# Patient Record
Sex: Female | Born: 1935
Health system: Southern US, Community
[De-identification: ages and names within clinical notes are randomized; demographics above are authoritative.]

## PROBLEM LIST (undated history)

## (undated) DIAGNOSIS — K648 Other hemorrhoids: Secondary | ICD-10-CM

## (undated) DIAGNOSIS — J449 Chronic obstructive pulmonary disease, unspecified: Secondary | ICD-10-CM

## (undated) DIAGNOSIS — E785 Hyperlipidemia, unspecified: Secondary | ICD-10-CM

## (undated) DIAGNOSIS — K2950 Unspecified chronic gastritis without bleeding: Secondary | ICD-10-CM

## (undated) DIAGNOSIS — F419 Anxiety disorder, unspecified: Secondary | ICD-10-CM

## (undated) DIAGNOSIS — R42 Dizziness and giddiness: Secondary | ICD-10-CM

## (undated) DIAGNOSIS — I779 Disorder of arteries and arterioles, unspecified: Secondary | ICD-10-CM

## (undated) DIAGNOSIS — H9190 Unspecified hearing loss, unspecified ear: Secondary | ICD-10-CM

## (undated) DIAGNOSIS — I219 Acute myocardial infarction, unspecified: Secondary | ICD-10-CM

## (undated) DIAGNOSIS — R0602 Shortness of breath: Secondary | ICD-10-CM

## (undated) DIAGNOSIS — I2510 Atherosclerotic heart disease of native coronary artery without angina pectoris: Secondary | ICD-10-CM

## (undated) DIAGNOSIS — K219 Gastro-esophageal reflux disease without esophagitis: Secondary | ICD-10-CM

## (undated) DIAGNOSIS — Z9889 Other specified postprocedural states: Secondary | ICD-10-CM

## (undated) DIAGNOSIS — D692 Other nonthrombocytopenic purpura: Secondary | ICD-10-CM

## (undated) DIAGNOSIS — K449 Diaphragmatic hernia without obstruction or gangrene: Secondary | ICD-10-CM

## (undated) DIAGNOSIS — K589 Irritable bowel syndrome without diarrhea: Secondary | ICD-10-CM

## (undated) DIAGNOSIS — C801 Malignant (primary) neoplasm, unspecified: Secondary | ICD-10-CM

## (undated) DIAGNOSIS — F329 Major depressive disorder, single episode, unspecified: Secondary | ICD-10-CM

## (undated) DIAGNOSIS — I739 Peripheral vascular disease, unspecified: Secondary | ICD-10-CM

## (undated) DIAGNOSIS — I639 Cerebral infarction, unspecified: Secondary | ICD-10-CM

## (undated) DIAGNOSIS — F32A Depression, unspecified: Secondary | ICD-10-CM

## (undated) DIAGNOSIS — I509 Heart failure, unspecified: Secondary | ICD-10-CM

## (undated) DIAGNOSIS — R112 Nausea with vomiting, unspecified: Secondary | ICD-10-CM

## (undated) DIAGNOSIS — D130 Benign neoplasm of esophagus: Secondary | ICD-10-CM

## (undated) DIAGNOSIS — K5730 Diverticulosis of large intestine without perforation or abscess without bleeding: Secondary | ICD-10-CM

## (undated) DIAGNOSIS — M1990 Unspecified osteoarthritis, unspecified site: Secondary | ICD-10-CM

## (undated) HISTORY — DX: Disorder of arteries and arterioles, unspecified: I77.9

## (undated) HISTORY — PX: TONSILLECTOMY: SUR1361

## (undated) HISTORY — DX: Unspecified chronic gastritis without bleeding: K29.50

## (undated) HISTORY — DX: Unspecified hearing loss, unspecified ear: H91.90

## (undated) HISTORY — DX: Peripheral vascular disease, unspecified: I73.9

## (undated) HISTORY — DX: Gastro-esophageal reflux disease without esophagitis: K21.9

## (undated) HISTORY — PX: BREAST SURGERY: SHX581

## (undated) HISTORY — PX: TUBAL LIGATION: SHX77

## (undated) HISTORY — DX: Irritable bowel syndrome, unspecified: K58.9

## (undated) HISTORY — DX: Benign neoplasm of esophagus: D13.0

## (undated) HISTORY — DX: Other nonthrombocytopenic purpura: D69.2

## (undated) HISTORY — PX: SHOULDER SURGERY: SHX246

## (undated) HISTORY — DX: Depression, unspecified: F32.A

## (undated) HISTORY — PX: DENTAL SURGERY: SHX609

## (undated) HISTORY — DX: Chronic obstructive pulmonary disease, unspecified: J44.9

## (undated) HISTORY — DX: Anxiety disorder, unspecified: F41.9

## (undated) HISTORY — DX: Atherosclerotic heart disease of native coronary artery without angina pectoris: I25.10

## (undated) HISTORY — DX: Dizziness and giddiness: R42

## (undated) HISTORY — DX: Diaphragmatic hernia without obstruction or gangrene: K44.9

## (undated) HISTORY — PX: UPPER GASTROINTESTINAL ENDOSCOPY: SHX188

## (undated) HISTORY — DX: Diverticulosis of large intestine without perforation or abscess without bleeding: K57.30

## (undated) HISTORY — PX: FOOT SURGERY: SHX648

## (undated) HISTORY — DX: Other hemorrhoids: K64.8

## (undated) HISTORY — DX: Acute myocardial infarction, unspecified: I21.9

## (undated) HISTORY — PX: ANGIOPLASTY: SHX39

## (undated) HISTORY — DX: Hyperlipidemia, unspecified: E78.5

## (undated) HISTORY — PX: CATARACT EXTRACTION W/ INTRAOCULAR LENS  IMPLANT, BILATERAL: SHX1307

## (undated) HISTORY — PX: COLON SURGERY: SHX602

## (undated) HISTORY — DX: Heart failure, unspecified: I50.9

## (undated) HISTORY — PX: CAROTID ENDARTERECTOMY: SUR193

## (undated) HISTORY — PX: COLONOSCOPY: SHX174

## (undated) HISTORY — DX: Major depressive disorder, single episode, unspecified: F32.9

## (undated) HISTORY — DX: Unspecified osteoarthritis, unspecified site: M19.90

## (undated) HISTORY — PX: TOE SURGERY: SHX1073

---

## 1983-10-30 DIAGNOSIS — I219 Acute myocardial infarction, unspecified: Secondary | ICD-10-CM

## 1983-10-30 HISTORY — DX: Acute myocardial infarction, unspecified: I21.9

## 1998-07-22 ENCOUNTER — Other Ambulatory Visit: Admission: RE | Admit: 1998-07-22 | Discharge: 1998-07-22 | Payer: Self-pay | Admitting: Obstetrics and Gynecology

## 1999-12-06 ENCOUNTER — Encounter: Payer: Self-pay | Admitting: Internal Medicine

## 1999-12-06 ENCOUNTER — Encounter: Admission: RE | Admit: 1999-12-06 | Discharge: 1999-12-06 | Payer: Self-pay | Admitting: Internal Medicine

## 2000-12-10 ENCOUNTER — Encounter: Admission: RE | Admit: 2000-12-10 | Discharge: 2000-12-10 | Payer: Self-pay | Admitting: Internal Medicine

## 2000-12-10 ENCOUNTER — Encounter: Payer: Self-pay | Admitting: Internal Medicine

## 2001-05-21 ENCOUNTER — Other Ambulatory Visit: Admission: RE | Admit: 2001-05-21 | Discharge: 2001-05-21 | Payer: Self-pay | Admitting: Obstetrics and Gynecology

## 2001-08-27 ENCOUNTER — Encounter: Payer: Self-pay | Admitting: Emergency Medicine

## 2001-08-27 ENCOUNTER — Emergency Department (HOSPITAL_COMMUNITY): Admission: EM | Admit: 2001-08-27 | Discharge: 2001-08-27 | Payer: Self-pay

## 2001-11-27 ENCOUNTER — Encounter (INDEPENDENT_AMBULATORY_CARE_PROVIDER_SITE_OTHER): Payer: Self-pay | Admitting: Gastroenterology

## 2001-12-11 ENCOUNTER — Encounter: Payer: Self-pay | Admitting: Internal Medicine

## 2001-12-11 ENCOUNTER — Encounter: Admission: RE | Admit: 2001-12-11 | Discharge: 2001-12-11 | Payer: Self-pay | Admitting: Internal Medicine

## 2002-06-24 ENCOUNTER — Encounter: Payer: Self-pay | Admitting: Gastroenterology

## 2002-06-24 ENCOUNTER — Ambulatory Visit (HOSPITAL_COMMUNITY): Admission: RE | Admit: 2002-06-24 | Discharge: 2002-06-24 | Payer: Self-pay | Admitting: Gastroenterology

## 2002-06-26 ENCOUNTER — Encounter: Payer: Self-pay | Admitting: General Surgery

## 2002-06-26 ENCOUNTER — Encounter: Admission: RE | Admit: 2002-06-26 | Discharge: 2002-06-26 | Payer: Self-pay | Admitting: General Surgery

## 2002-12-14 ENCOUNTER — Encounter: Admission: RE | Admit: 2002-12-14 | Discharge: 2002-12-14 | Payer: Self-pay | Admitting: Internal Medicine

## 2002-12-14 ENCOUNTER — Encounter: Payer: Self-pay | Admitting: Internal Medicine

## 2003-02-28 ENCOUNTER — Encounter: Payer: Self-pay | Admitting: Family Medicine

## 2003-02-28 ENCOUNTER — Ambulatory Visit (HOSPITAL_COMMUNITY): Admission: RE | Admit: 2003-02-28 | Discharge: 2003-02-28 | Payer: Self-pay | Admitting: Family Medicine

## 2004-02-03 ENCOUNTER — Ambulatory Visit (HOSPITAL_COMMUNITY): Admission: RE | Admit: 2004-02-03 | Discharge: 2004-02-03 | Payer: Self-pay | Admitting: Internal Medicine

## 2004-09-06 ENCOUNTER — Ambulatory Visit: Payer: Self-pay | Admitting: Gastroenterology

## 2004-11-06 ENCOUNTER — Ambulatory Visit: Payer: Self-pay | Admitting: Internal Medicine

## 2004-12-05 ENCOUNTER — Ambulatory Visit: Payer: Self-pay | Admitting: Internal Medicine

## 2004-12-06 ENCOUNTER — Encounter: Admission: RE | Admit: 2004-12-06 | Discharge: 2004-12-06 | Payer: Self-pay | Admitting: Internal Medicine

## 2005-02-12 ENCOUNTER — Other Ambulatory Visit: Admission: RE | Admit: 2005-02-12 | Discharge: 2005-02-12 | Payer: Self-pay | Admitting: Obstetrics and Gynecology

## 2005-02-19 ENCOUNTER — Ambulatory Visit: Payer: Self-pay | Admitting: Internal Medicine

## 2005-02-22 ENCOUNTER — Ambulatory Visit (HOSPITAL_COMMUNITY): Admission: RE | Admit: 2005-02-22 | Discharge: 2005-02-22 | Payer: Self-pay | Admitting: Internal Medicine

## 2005-09-05 ENCOUNTER — Ambulatory Visit: Payer: Self-pay | Admitting: Gastroenterology

## 2005-09-06 ENCOUNTER — Ambulatory Visit: Payer: Self-pay | Admitting: Internal Medicine

## 2005-10-10 ENCOUNTER — Ambulatory Visit: Payer: Self-pay | Admitting: Gastroenterology

## 2005-10-16 ENCOUNTER — Ambulatory Visit: Payer: Self-pay | Admitting: Gastroenterology

## 2005-10-30 ENCOUNTER — Encounter (INDEPENDENT_AMBULATORY_CARE_PROVIDER_SITE_OTHER): Payer: Self-pay | Admitting: Specialist

## 2005-10-30 ENCOUNTER — Ambulatory Visit: Payer: Self-pay | Admitting: Gastroenterology

## 2005-11-07 ENCOUNTER — Ambulatory Visit: Payer: Self-pay | Admitting: Internal Medicine

## 2006-03-13 ENCOUNTER — Ambulatory Visit: Payer: Self-pay | Admitting: Internal Medicine

## 2006-08-13 ENCOUNTER — Ambulatory Visit (HOSPITAL_COMMUNITY): Admission: RE | Admit: 2006-08-13 | Discharge: 2006-08-13 | Payer: Self-pay | Admitting: Internal Medicine

## 2006-08-20 ENCOUNTER — Ambulatory Visit: Payer: Self-pay | Admitting: Internal Medicine

## 2006-08-20 LAB — CONVERTED CEMR LAB
ALT: 17 units/L (ref 0–40)
AST: 21 units/L (ref 0–37)
Albumin: 4 g/dL (ref 3.5–5.2)
Basophils Absolute: 0.1 10*3/uL (ref 0.0–0.1)
Basophils Relative: 0.5 % (ref 0.0–1.0)
Bilirubin, Direct: 0.1 mg/dL (ref 0.0–0.3)
Eosinophil percent: 1.4 % (ref 0.0–5.0)
HCT: 40.4 % (ref 36.0–46.0)
Hemoglobin: 13.5 g/dL (ref 12.0–15.0)
Lymphocytes Relative: 25 % (ref 12.0–46.0)
Neutrophils Relative %: 65.5 % (ref 43.0–77.0)
Platelets: 209 10*3/uL (ref 150–400)
RBC: 4.13 M/uL (ref 3.87–5.11)
Total Protein: 7.8 g/dL (ref 6.0–8.3)

## 2006-10-14 ENCOUNTER — Ambulatory Visit: Payer: Self-pay | Admitting: Internal Medicine

## 2006-11-13 ENCOUNTER — Emergency Department (HOSPITAL_COMMUNITY): Admission: EM | Admit: 2006-11-13 | Discharge: 2006-11-13 | Payer: Self-pay | Admitting: Emergency Medicine

## 2006-12-12 ENCOUNTER — Ambulatory Visit: Payer: Self-pay | Admitting: Internal Medicine

## 2007-01-03 ENCOUNTER — Ambulatory Visit: Payer: Self-pay | Admitting: Internal Medicine

## 2007-02-10 ENCOUNTER — Ambulatory Visit: Payer: Self-pay | Admitting: Gastroenterology

## 2007-02-10 LAB — CONVERTED CEMR LAB
Basophils Absolute: 0 10*3/uL (ref 0.0–0.1)
Basophils Relative: 0.3 % (ref 0.0–1.0)
Calcium: 8.9 mg/dL (ref 8.4–10.5)
Eosinophils Absolute: 0.1 10*3/uL (ref 0.0–0.6)
GFR calc Af Amer: 105 mL/min
GFR calc non Af Amer: 86 mL/min
Glucose, Bld: 104 mg/dL — ABNORMAL HIGH (ref 70–99)
Lymphocytes Relative: 28 % (ref 12.0–46.0)
MCHC: 35.2 g/dL (ref 30.0–36.0)
MCV: 95.2 fL (ref 78.0–100.0)
Monocytes Absolute: 0.7 10*3/uL (ref 0.2–0.7)
Neutrophils Relative %: 62.9 % (ref 43.0–77.0)
Potassium: 4.2 meq/L (ref 3.5–5.1)
RBC: 4.15 M/uL (ref 3.87–5.11)
Tissue Transglutaminase Ab, IgA: <3 units (ref <5)

## 2007-02-12 ENCOUNTER — Encounter (INDEPENDENT_AMBULATORY_CARE_PROVIDER_SITE_OTHER): Payer: Self-pay | Admitting: Gastroenterology

## 2007-02-13 ENCOUNTER — Encounter (INDEPENDENT_AMBULATORY_CARE_PROVIDER_SITE_OTHER): Payer: Self-pay | Admitting: Gastroenterology

## 2007-03-12 ENCOUNTER — Ambulatory Visit: Payer: Self-pay | Admitting: Gastroenterology

## 2007-03-17 ENCOUNTER — Ambulatory Visit (HOSPITAL_COMMUNITY): Admission: RE | Admit: 2007-03-17 | Discharge: 2007-03-17 | Payer: Self-pay | Admitting: Gastroenterology

## 2007-03-19 ENCOUNTER — Ambulatory Visit: Payer: Self-pay | Admitting: Gastroenterology

## 2007-03-23 ENCOUNTER — Emergency Department (HOSPITAL_COMMUNITY): Admission: EM | Admit: 2007-03-23 | Discharge: 2007-03-23 | Payer: Self-pay | Admitting: Emergency Medicine

## 2007-03-31 ENCOUNTER — Ambulatory Visit: Payer: Self-pay | Admitting: Internal Medicine

## 2007-03-31 DIAGNOSIS — R51 Headache: Secondary | ICD-10-CM

## 2007-03-31 DIAGNOSIS — R42 Dizziness and giddiness: Secondary | ICD-10-CM | POA: Insufficient documentation

## 2007-05-08 ENCOUNTER — Encounter: Payer: Self-pay | Admitting: Internal Medicine

## 2007-07-15 ENCOUNTER — Encounter: Payer: Self-pay | Admitting: Internal Medicine

## 2007-08-06 ENCOUNTER — Emergency Department (HOSPITAL_COMMUNITY): Admission: EM | Admit: 2007-08-06 | Discharge: 2007-08-06 | Payer: Self-pay | Admitting: Emergency Medicine

## 2007-09-09 ENCOUNTER — Ambulatory Visit (HOSPITAL_COMMUNITY): Admission: RE | Admit: 2007-09-09 | Discharge: 2007-09-09 | Payer: Self-pay | Admitting: Internal Medicine

## 2007-10-02 ENCOUNTER — Ambulatory Visit: Payer: Self-pay | Admitting: Internal Medicine

## 2007-10-02 ENCOUNTER — Telehealth (INDEPENDENT_AMBULATORY_CARE_PROVIDER_SITE_OTHER): Payer: Self-pay | Admitting: *Deleted

## 2007-10-09 ENCOUNTER — Ambulatory Visit: Payer: Self-pay | Admitting: Internal Medicine

## 2007-10-09 DIAGNOSIS — I10 Essential (primary) hypertension: Secondary | ICD-10-CM

## 2007-10-09 DIAGNOSIS — R930 Abnormal findings on diagnostic imaging of skull and head, not elsewhere classified: Secondary | ICD-10-CM | POA: Insufficient documentation

## 2007-10-09 DIAGNOSIS — Z720 Tobacco use: Secondary | ICD-10-CM

## 2007-10-09 DIAGNOSIS — Z9861 Coronary angioplasty status: Secondary | ICD-10-CM

## 2007-10-09 DIAGNOSIS — E785 Hyperlipidemia, unspecified: Secondary | ICD-10-CM

## 2007-10-09 DIAGNOSIS — J449 Chronic obstructive pulmonary disease, unspecified: Secondary | ICD-10-CM | POA: Insufficient documentation

## 2007-10-09 DIAGNOSIS — I2510 Atherosclerotic heart disease of native coronary artery without angina pectoris: Secondary | ICD-10-CM

## 2007-11-06 ENCOUNTER — Encounter: Admission: RE | Admit: 2007-11-06 | Discharge: 2007-11-06 | Payer: Self-pay | Admitting: Occupational Medicine

## 2007-11-11 ENCOUNTER — Ambulatory Visit: Payer: Self-pay | Admitting: Cardiovascular Disease

## 2007-11-13 ENCOUNTER — Encounter: Payer: Self-pay | Admitting: Internal Medicine

## 2007-11-19 DIAGNOSIS — E663 Overweight: Secondary | ICD-10-CM | POA: Insufficient documentation

## 2007-11-20 ENCOUNTER — Ambulatory Visit: Payer: Self-pay | Admitting: Internal Medicine

## 2007-11-20 DIAGNOSIS — J84111 Idiopathic interstitial pneumonia, not otherwise specified: Secondary | ICD-10-CM

## 2007-11-20 DIAGNOSIS — R911 Solitary pulmonary nodule: Secondary | ICD-10-CM

## 2007-11-27 ENCOUNTER — Ambulatory Visit: Payer: Self-pay | Admitting: Internal Medicine

## 2007-11-28 ENCOUNTER — Ambulatory Visit: Payer: Self-pay | Admitting: Vascular Surgery

## 2008-01-15 ENCOUNTER — Ambulatory Visit: Payer: Self-pay | Admitting: Internal Medicine

## 2008-03-19 ENCOUNTER — Ambulatory Visit: Payer: Self-pay | Admitting: Internal Medicine

## 2008-04-29 ENCOUNTER — Encounter: Payer: Self-pay | Admitting: Internal Medicine

## 2008-06-08 ENCOUNTER — Ambulatory Visit: Payer: Self-pay | Admitting: Internal Medicine

## 2008-06-08 DIAGNOSIS — I959 Hypotension, unspecified: Secondary | ICD-10-CM

## 2008-06-08 DIAGNOSIS — D692 Other nonthrombocytopenic purpura: Secondary | ICD-10-CM | POA: Insufficient documentation

## 2008-06-08 DIAGNOSIS — I6529 Occlusion and stenosis of unspecified carotid artery: Secondary | ICD-10-CM

## 2008-06-13 LAB — CONVERTED CEMR LAB
Basophils Relative: 0.5 % (ref 0.0–3.0)
Lymphocytes Relative: 26.1 % (ref 12.0–46.0)
MCHC: 35.1 g/dL (ref 30.0–36.0)
MCV: 96.4 fL (ref 78.0–100.0)
Monocytes Absolute: 1 10*3/uL (ref 0.1–1.0)
Monocytes Relative: 9.9 % (ref 3.0–12.0)
Neutrophils Relative %: 62 % (ref 43.0–77.0)
Platelets: 197 10*3/uL (ref 150–400)
Potassium: 4.2 meq/L (ref 3.5–5.1)
RDW: 12.2 % (ref 11.5–14.6)

## 2008-06-14 ENCOUNTER — Encounter (INDEPENDENT_AMBULATORY_CARE_PROVIDER_SITE_OTHER): Payer: Self-pay | Admitting: *Deleted

## 2008-06-16 ENCOUNTER — Encounter (INDEPENDENT_AMBULATORY_CARE_PROVIDER_SITE_OTHER): Payer: Self-pay | Admitting: *Deleted

## 2008-06-16 ENCOUNTER — Ambulatory Visit: Payer: Self-pay | Admitting: Internal Medicine

## 2008-06-16 LAB — CONVERTED CEMR LAB
OCCULT 2: NEGATIVE
OCCULT 3: NEGATIVE

## 2008-06-18 ENCOUNTER — Ambulatory Visit: Payer: Self-pay | Admitting: Vascular Surgery

## 2008-07-01 ENCOUNTER — Encounter: Payer: Self-pay | Admitting: Internal Medicine

## 2008-07-13 ENCOUNTER — Encounter: Payer: Self-pay | Admitting: Internal Medicine

## 2008-07-19 ENCOUNTER — Telehealth (INDEPENDENT_AMBULATORY_CARE_PROVIDER_SITE_OTHER): Payer: Self-pay | Admitting: *Deleted

## 2008-07-20 ENCOUNTER — Ambulatory Visit: Payer: Self-pay | Admitting: Family Medicine

## 2008-07-20 DIAGNOSIS — K3189 Other diseases of stomach and duodenum: Secondary | ICD-10-CM

## 2008-07-20 DIAGNOSIS — R1013 Epigastric pain: Secondary | ICD-10-CM

## 2008-07-21 ENCOUNTER — Encounter (INDEPENDENT_AMBULATORY_CARE_PROVIDER_SITE_OTHER): Payer: Self-pay | Admitting: *Deleted

## 2008-08-05 ENCOUNTER — Encounter: Payer: Self-pay | Admitting: Family Medicine

## 2008-08-11 ENCOUNTER — Encounter: Payer: Self-pay | Admitting: Family Medicine

## 2008-08-11 DIAGNOSIS — K449 Diaphragmatic hernia without obstruction or gangrene: Secondary | ICD-10-CM | POA: Insufficient documentation

## 2008-08-11 DIAGNOSIS — K5730 Diverticulosis of large intestine without perforation or abscess without bleeding: Secondary | ICD-10-CM | POA: Insufficient documentation

## 2008-08-11 DIAGNOSIS — K2940 Chronic atrophic gastritis without bleeding: Secondary | ICD-10-CM | POA: Insufficient documentation

## 2008-08-11 DIAGNOSIS — D130 Benign neoplasm of esophagus: Secondary | ICD-10-CM | POA: Insufficient documentation

## 2008-08-13 ENCOUNTER — Ambulatory Visit: Payer: Self-pay | Admitting: Internal Medicine

## 2008-08-13 DIAGNOSIS — R1319 Other dysphagia: Secondary | ICD-10-CM

## 2008-08-16 ENCOUNTER — Encounter: Admission: RE | Admit: 2008-08-16 | Discharge: 2008-10-01 | Payer: Self-pay | Admitting: Neurology

## 2008-08-18 ENCOUNTER — Ambulatory Visit (HOSPITAL_COMMUNITY): Admission: RE | Admit: 2008-08-18 | Discharge: 2008-08-18 | Payer: Self-pay | Admitting: Internal Medicine

## 2008-08-18 ENCOUNTER — Ambulatory Visit: Payer: Self-pay | Admitting: Internal Medicine

## 2008-11-03 ENCOUNTER — Ambulatory Visit (HOSPITAL_COMMUNITY): Admission: RE | Admit: 2008-11-03 | Discharge: 2008-11-03 | Payer: Self-pay | Admitting: Internal Medicine

## 2008-11-05 ENCOUNTER — Encounter: Payer: Self-pay | Admitting: Internal Medicine

## 2008-11-05 ENCOUNTER — Ambulatory Visit: Payer: Self-pay | Admitting: Internal Medicine

## 2008-11-05 DIAGNOSIS — H9319 Tinnitus, unspecified ear: Secondary | ICD-10-CM | POA: Insufficient documentation

## 2008-11-05 DIAGNOSIS — M25519 Pain in unspecified shoulder: Secondary | ICD-10-CM | POA: Insufficient documentation

## 2008-11-05 DIAGNOSIS — H918X9 Other specified hearing loss, unspecified ear: Secondary | ICD-10-CM

## 2008-11-30 ENCOUNTER — Telehealth (INDEPENDENT_AMBULATORY_CARE_PROVIDER_SITE_OTHER): Payer: Self-pay | Admitting: *Deleted

## 2008-12-03 ENCOUNTER — Ambulatory Visit: Payer: Self-pay | Admitting: Internal Medicine

## 2008-12-06 ENCOUNTER — Telehealth (INDEPENDENT_AMBULATORY_CARE_PROVIDER_SITE_OTHER): Payer: Self-pay | Admitting: *Deleted

## 2008-12-07 ENCOUNTER — Ambulatory Visit: Payer: Self-pay | Admitting: Cardiology

## 2008-12-09 ENCOUNTER — Ambulatory Visit: Payer: Self-pay | Admitting: Internal Medicine

## 2008-12-09 DIAGNOSIS — R0602 Shortness of breath: Secondary | ICD-10-CM

## 2008-12-27 ENCOUNTER — Ambulatory Visit: Payer: Self-pay | Admitting: Internal Medicine

## 2008-12-27 DIAGNOSIS — K648 Other hemorrhoids: Secondary | ICD-10-CM | POA: Insufficient documentation

## 2008-12-27 DIAGNOSIS — K625 Hemorrhage of anus and rectum: Secondary | ICD-10-CM | POA: Insufficient documentation

## 2008-12-27 DIAGNOSIS — K589 Irritable bowel syndrome without diarrhea: Secondary | ICD-10-CM | POA: Insufficient documentation

## 2008-12-28 ENCOUNTER — Telehealth: Payer: Self-pay | Admitting: Internal Medicine

## 2008-12-30 ENCOUNTER — Telehealth (INDEPENDENT_AMBULATORY_CARE_PROVIDER_SITE_OTHER): Payer: Self-pay | Admitting: *Deleted

## 2008-12-31 ENCOUNTER — Ambulatory Visit: Payer: Self-pay | Admitting: Internal Medicine

## 2009-01-03 ENCOUNTER — Telehealth (INDEPENDENT_AMBULATORY_CARE_PROVIDER_SITE_OTHER): Payer: Self-pay | Admitting: *Deleted

## 2009-01-03 ENCOUNTER — Encounter (INDEPENDENT_AMBULATORY_CARE_PROVIDER_SITE_OTHER): Payer: Self-pay | Admitting: *Deleted

## 2009-01-12 ENCOUNTER — Encounter: Payer: Self-pay | Admitting: Internal Medicine

## 2009-01-17 ENCOUNTER — Telehealth (INDEPENDENT_AMBULATORY_CARE_PROVIDER_SITE_OTHER): Payer: Self-pay | Admitting: *Deleted

## 2009-01-20 ENCOUNTER — Telehealth: Payer: Self-pay | Admitting: Internal Medicine

## 2009-07-12 ENCOUNTER — Telehealth (INDEPENDENT_AMBULATORY_CARE_PROVIDER_SITE_OTHER): Payer: Self-pay | Admitting: *Deleted

## 2009-07-20 ENCOUNTER — Encounter: Payer: Self-pay | Admitting: Internal Medicine

## 2009-07-22 ENCOUNTER — Encounter: Payer: Self-pay | Admitting: Cardiology

## 2009-08-01 ENCOUNTER — Ambulatory Visit: Payer: Self-pay | Admitting: Cardiology

## 2009-08-01 DIAGNOSIS — K219 Gastro-esophageal reflux disease without esophagitis: Secondary | ICD-10-CM

## 2009-08-01 DIAGNOSIS — F329 Major depressive disorder, single episode, unspecified: Secondary | ICD-10-CM

## 2009-08-04 ENCOUNTER — Ambulatory Visit: Payer: Self-pay | Admitting: Internal Medicine

## 2009-08-04 ENCOUNTER — Telehealth (INDEPENDENT_AMBULATORY_CARE_PROVIDER_SITE_OTHER): Payer: Self-pay

## 2009-08-08 ENCOUNTER — Ambulatory Visit: Payer: Self-pay

## 2009-08-08 ENCOUNTER — Encounter (HOSPITAL_COMMUNITY): Admission: RE | Admit: 2009-08-08 | Discharge: 2009-08-08 | Payer: Self-pay | Admitting: Internal Medicine

## 2009-08-08 ENCOUNTER — Ambulatory Visit: Payer: Self-pay | Admitting: Internal Medicine

## 2009-08-08 ENCOUNTER — Telehealth (INDEPENDENT_AMBULATORY_CARE_PROVIDER_SITE_OTHER): Payer: Self-pay | Admitting: *Deleted

## 2009-08-11 ENCOUNTER — Telehealth (INDEPENDENT_AMBULATORY_CARE_PROVIDER_SITE_OTHER): Payer: Self-pay | Admitting: *Deleted

## 2009-08-16 ENCOUNTER — Encounter (INDEPENDENT_AMBULATORY_CARE_PROVIDER_SITE_OTHER): Payer: Self-pay | Admitting: *Deleted

## 2009-08-16 ENCOUNTER — Telehealth (INDEPENDENT_AMBULATORY_CARE_PROVIDER_SITE_OTHER): Payer: Self-pay | Admitting: *Deleted

## 2009-08-26 ENCOUNTER — Ambulatory Visit: Payer: Self-pay | Admitting: Internal Medicine

## 2009-08-26 DIAGNOSIS — J8410 Pulmonary fibrosis, unspecified: Secondary | ICD-10-CM | POA: Insufficient documentation

## 2009-09-05 ENCOUNTER — Telehealth (INDEPENDENT_AMBULATORY_CARE_PROVIDER_SITE_OTHER): Payer: Self-pay | Admitting: *Deleted

## 2009-09-08 ENCOUNTER — Ambulatory Visit (HOSPITAL_BASED_OUTPATIENT_CLINIC_OR_DEPARTMENT_OTHER): Admission: RE | Admit: 2009-09-08 | Discharge: 2009-09-08 | Payer: Self-pay | Admitting: Orthopedic Surgery

## 2009-09-20 ENCOUNTER — Telehealth (INDEPENDENT_AMBULATORY_CARE_PROVIDER_SITE_OTHER): Payer: Self-pay | Admitting: *Deleted

## 2009-10-10 ENCOUNTER — Encounter: Admission: RE | Admit: 2009-10-10 | Discharge: 2009-10-10 | Payer: Self-pay | Admitting: Internal Medicine

## 2009-11-04 ENCOUNTER — Ambulatory Visit (HOSPITAL_COMMUNITY): Admission: RE | Admit: 2009-11-04 | Discharge: 2009-11-04 | Payer: Self-pay | Admitting: Internal Medicine

## 2009-11-15 ENCOUNTER — Ambulatory Visit: Payer: Self-pay | Admitting: Internal Medicine

## 2009-11-15 DIAGNOSIS — R7309 Other abnormal glucose: Secondary | ICD-10-CM

## 2009-12-08 ENCOUNTER — Ambulatory Visit: Payer: Self-pay | Admitting: Internal Medicine

## 2009-12-08 DIAGNOSIS — H9190 Unspecified hearing loss, unspecified ear: Secondary | ICD-10-CM | POA: Insufficient documentation

## 2010-02-07 ENCOUNTER — Encounter: Payer: Self-pay | Admitting: Internal Medicine

## 2010-03-02 ENCOUNTER — Ambulatory Visit: Payer: Self-pay | Admitting: Internal Medicine

## 2010-03-21 ENCOUNTER — Encounter: Payer: Self-pay | Admitting: Internal Medicine

## 2010-05-25 ENCOUNTER — Encounter: Payer: Self-pay | Admitting: Internal Medicine

## 2010-06-09 ENCOUNTER — Ambulatory Visit: Payer: Self-pay | Admitting: Internal Medicine

## 2010-06-09 LAB — CONVERTED CEMR LAB
Cholesterol, target level: 200 mg/dL
HDL goal, serum: 40 mg/dL
LDL Goal: 70 mg/dL

## 2010-08-10 ENCOUNTER — Ambulatory Visit: Payer: Self-pay | Admitting: Cardiology

## 2010-08-10 DIAGNOSIS — R0989 Other specified symptoms and signs involving the circulatory and respiratory systems: Secondary | ICD-10-CM

## 2010-08-24 ENCOUNTER — Encounter: Payer: Self-pay | Admitting: Cardiology

## 2010-08-24 ENCOUNTER — Ambulatory Visit: Payer: Self-pay

## 2010-08-30 ENCOUNTER — Telehealth: Payer: Self-pay | Admitting: Internal Medicine

## 2010-09-27 ENCOUNTER — Encounter: Payer: Self-pay | Admitting: Cardiology

## 2010-09-27 ENCOUNTER — Ambulatory Visit: Payer: Self-pay

## 2010-10-05 ENCOUNTER — Encounter: Payer: Self-pay | Admitting: Internal Medicine

## 2010-10-10 ENCOUNTER — Ambulatory Visit: Payer: Self-pay | Admitting: Internal Medicine

## 2010-10-12 ENCOUNTER — Ambulatory Visit (HOSPITAL_COMMUNITY)
Admission: RE | Admit: 2010-10-12 | Discharge: 2010-10-12 | Payer: Self-pay | Source: Home / Self Care | Attending: Obstetrics and Gynecology | Admitting: Obstetrics and Gynecology

## 2010-11-08 ENCOUNTER — Encounter: Payer: Self-pay | Admitting: Internal Medicine

## 2010-11-19 ENCOUNTER — Encounter: Payer: Self-pay | Admitting: Obstetrics and Gynecology

## 2010-11-21 ENCOUNTER — Encounter: Payer: Self-pay | Admitting: Internal Medicine

## 2010-11-22 ENCOUNTER — Other Ambulatory Visit: Payer: Self-pay | Admitting: Internal Medicine

## 2010-11-22 DIAGNOSIS — Z1239 Encounter for other screening for malignant neoplasm of breast: Secondary | ICD-10-CM

## 2010-11-28 NOTE — Medication Information (Signed)
Summary: Plavix/Pharm Store  Plavix/Pharm Store   Imported By: Lanelle Bal 02/13/2010 12:50:22  _____________________________________________________________________  External Attachment:    Type:   Image     Comment:   External Document

## 2010-11-28 NOTE — Assessment & Plan Note (Signed)
Summary: DISCUSS RESULTS/LYMPH NODES/KDC   Vital Signs:  Patient profile:   77 year old female Weight:      152.6 pounds Pulse rate:   72 / minute Resp:     16 per minute BP sitting:   126 / 80  (left arm) Cuff size:   large  Vitals Entered By: Shonna Chock (November 15, 2009 3:19 PM) CC: Patient states " I have a lot of things I need to discuss with the Doctor" Comments REVIEWED MED LIST, PATIENT AGREED DOSE AND INSTRUCTION CORRECT    Primary Care Provider:  Marga Melnick, MD  CC:  Patient states " I have a lot of things I need to discuss with the Doctor".  History of Present Illness: Annual labs done @ [facility]. Labs reviewed & risks discussed; UIP present  on Xray with restrictive pattern on PFTs. WBC minimnally elvated ; hyperglycemia (108)& TG  173).Smoking 6 cigarettes/ day; 40 pack years. No FH DM  Allergies (verified): No Known Drug Allergies  Review of Systems General:  Denies chills, fever, sweats, and weight loss. Resp:  Complains of cough and sputum productive; denies coughing up blood, shortness of breath, and wheezing; sputum in eve. Derm:  Denies poor wound healing. Endo:  Denies excessive hunger, excessive thirst, and excessive urination.  Physical Exam  General:  Appears younger than age,well-nourished,in no acute distress; alert,appropriate and cooperative throughout examination Neck:  No deformities, masses, or tenderness noted. Lungs:  Normal respiratory effort, chest expands symmetrically. Lungs: dry crackles  @ bases Heart:  Normal rate and regular rhythm. S1 and S2 normal without gallop, murmur, click, rub. Cervical Nodes:  No lymphadenopathy noted Axillary Nodes:  No palpable lymphadenopathy   Impression & Recommendations:  Problem # 1:  PULMONARY FIBROSIS (ICD-515) with restrictive pattern on PFTs  Problem # 2:  FASTING HYPERGLYCEMIA (ICD-790.29)  Problem # 3:  HYPERLIPIDEMIA (ICD-272.4)  Her updated medication list  for this problem includes:    Zocor 40 Mg Tabs (Simvastatin) .Marland Kitchen... 1 by mouth qd    Niacin Cr 1000 Mg Tbcr (Niacin) .Marland KitchenMarland KitchenMarland KitchenMarland Kitchen 3tabs once daily  Complete Medication List: 1)  Plavix 75 Mg Tabs (Clopidogrel bisulfate) .Marland Kitchen.. 1 tablet daily*office visit due now 2)  Zoloft 50 Mg Tabs (Sertraline hcl) .Marland Kitchen.. 1 by mouth qd 3)  Zocor 40 Mg Tabs (Simvastatin) .Marland Kitchen.. 1 by mouth qd 4)  Multivitamins Tabs (Multiple vitamin) .... Take 1 tablet by mouth once a day 5)  Allegra 180 Mg Tabs (Fexofenadine hcl) .... Take 1 tablet by mouth once a day as needed 6)  Niacin Cr 1000 Mg Tbcr (Niacin) .... 3tabs once daily 7)  Fish Oil 300 Mg Caps (Omega-3 fatty acids) .... Take 1 capsule by mouth once a day 8)  Folic Acid 400 Mcg Tabs (Folic acid) .Marland Kitchen.. 1 tab by mouth once daily 9)  Omeprazole 20 Mg Cpdr (Omeprazole) .... By mouth daily. take one half hour before eating.  Other Orders: Tobacco use cessation intermediate 3-10 minutes (69629)  Patient Instructions: 1)  Consume LESS THAN 25 grams of sugar / day frommfoods & drinks with High Fructose Corn Syrup as #1,2 or #3 on label. 2)  Please schedule a follow-up appointment in 4 months. 3)  Lipid Panel prior to visit, ICD-9:272.2 4)  HbgA1C prior to visit, ICD-9:790.29 5)  Stop Smoking Tips: Choose a Quit date. Cut down before the Quit date. decide what you will do as a substitute when you feel the urge to smoke(gum,toothpick,exercise).

## 2010-11-28 NOTE — Progress Notes (Signed)
Summary: nos appt  Phone Note Call from Patient   Caller: [email or web address]  Call For: young Summary of Call: Rsc nos from 11/1 to 12/13. Initial call taken by: Darletta Moll,  August 30, 2010 10:12 AM

## 2010-11-28 NOTE — Assessment & Plan Note (Signed)
Summary: PER CHECK OUT      Allergies Added: NKDA  Primary Provider:  Marga Melnick, MD   History of Present Illness: 77 yo female for f/u of CAD. Patient did have a myocardial infarction in June of 1985. At that time cardiac catheterization revealed an occluded right coronary artery. She had angioplasty at that time. A followup catheterization in July of 1985 showed the artery to be patent. There was hypo-to akinesis of the inferior wall and mitral valve prolapse on catheterization. Note she had an echocardiogram performed on Mar 15, 2003. The LV function was normal. There was mild mitral regurgitation. She also has a history of cerebrovascular disease and has had prior carotid endarterectomy x3. Last Myoview in October of 2010 showed inferior and inferolateral infarct but no significant ischemia. Ejection fraction was 60%. I last saw her in October of 2010. Since then the patient has dyspnea with more extreme activities but not with routine activities. It is relieved with rest. It is not associated with chest pain. There is no orthopnea, PND or pedal edema. There is no syncope or palpitations. There is no exertional chest pain.   Current Medications (verified): 1)  Plavix 75 Mg Tabs (Clopidogrel Bisulfate) .Marland Kitchen.. 1 Tablet Daily*office Visit Due Now 2)  Zoloft 50 Mg Tabs (Sertraline Hcl) .Marland Kitchen.. 1 By Mouth Qd 3)  Zocor 40 Mg Tabs (Simvastatin) .Marland Kitchen.. 1 By Mouth Qd 4)  Multivitamins   Tabs (Multiple Vitamin) .... Take 1 Tablet By Mouth Once A Day 5)  Allegra 180 Mg  Tabs (Fexofenadine Hcl) .... Take 1 Tablet By Mouth Once A Day As Needed 6)  Fish Oil 300 Mg  Caps (Omega-3 Fatty Acids) .... Take 1 Capsule By Mouth Once A Day 7)  Folic Acid 400 Mcg Tabs (Folic Acid) .Marland Kitchen.. 1 Tab By Mouth Once Daily 8)  Omeprazole 20 Mg  Cpdr (Omeprazole) .... By Mouth Daily. Take One Half Hour Before Eating.  Allergies (verified): No Known Drug Allergies  Past History:  Past Medical History: PERIPHERAL VASCULAR  DISEASE (ICD-443.9) HYPERLIPIDEMIA (ICD-272.4) CORONARY ARTERY DISEASE (ICD-414.00) TINNITUS, CHRONIC (ICD-388.30) HEMORRHOIDS, INTERNAL (ICD-455.0) IRRITABLE BOWEL SYNDROME (ICD-564.1) HEARING LOSS, HIGH FREQUENCY (ICD-389.8) GASTRITIS, CHRONIC (ICD-535.10) NEOPLASM, BENIGN, ESOPHAGUS (ICD-211.0) HIATAL HERNIA (ICD-553.3) DIVERTICULOSIS, COLON (ICD-562.10) CAROTID ARTERY DISEASE (ICD-433.10) OTHER NONTHROMBOCYTOPENIC PURPURAS (ICD-287.2) PULMONARY NODULE, SOLITARY (ICD-518.89) PULMONARY FIBROSIS, IDIOPATHIC (ICD-516.3) COPD (ICD-496) VERTIGO (ICD-780.4) DEPRESSION (ICD-311) GERD (ICD-530.81) Rotator Cuff,S/P PT [place] Exposure Surveillance  Past Surgical History: Reviewed history from 03/02/2010 and no changes required. colonoscopy last completed 10/2005 foot surgery-bilateral toe surgery-right foot endarectomy- carotid Left side x 3, saphenous vein graft from left leg gravid 8 para 6 mis 2 tonsillectomy Left shoulder surgery  dental extractions  Social History: Reviewed history from 12/27/2008 and no changes required. Patient is a current smoker- 1/2 ppd Goes to [place] twice a year to visit sister- 3-4 weeks travel there every year. 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   Review of Systems       no fevers or chills, productive cough, hemoptysis, dysphasia, odynophagia, melena, hematochezia, dysuria, hematuria, rash, seizure activity, orthopnea, PND, pedal edema, claudication. Remaining systems are negative.   Vital Signs:  Patient profile:   77 year old female Height:      58.25 inches Weight:      150 pounds BMI:     31.19 Pulse rate:   80 / minute Resp:  16 per minute BP sitting:   164 / 87  (right arm)  Vitals Entered By: Marrion Coy, CNA (August 10, 2010 8:36 AM)  Physical Exam  General:  Well-developed well-nourished in no acute distress.  Skin is  warm and dry.  HEENT is normal.  Neck is supple. No thyromegaly.  Chest is clear to auscultation with normal expansion.  Cardiovascular exam is regular rate and rhythm.  Abdominal exam nontender or distended. No masses palpated. positive bruit Extremities show no edema. neuro grossly intact    EKG  Procedure date:  08/10/2010  Findings:      Sinus rhythm at a rate of 80. Left ventricular hypertrophy. Prior inferior infarct. No ST changes.  Impression & Recommendations:  Problem # 1:  ABDOMINAL BRUIT (ICD-785.9)  Schedule abdominal ultrasound.  Orders: Abdominal Aorta Duplex (Abd Aorta Duplex)  Problem # 2:  SMOKER (ICD-305.1) Pt instructed on importance of discontinuing.  Problem # 3:  PERIPHERAL VASCULAR DISEASE (ICD-443.9) Carotids followed by vascular surgery; continue plavix and statin.  Problem # 4:  HYPERLIPIDEMIA (ICD-272.4) Continue statin; lipids and liver followed by primary care. Her updated medication list for this problem includes:    Zocor 40 Mg Tabs (Simvastatin) .Marland Kitchen... 1 by mouth qd  Problem # 5:  CORONARY ARTERY DISEASE (ICD-414.00)  Continue plavix and statin. Her updated medication list for this problem includes:    Plavix 75 Mg Tabs (Clopidogrel bisulfate) .Marland Kitchen... 1 tablet daily*office visit due now  Orders: EKG w/ Interpretation (93000)  Problem # 6:  HYPERTENSION (ICD-401.9) Blood pressure elevated; pt states typically normal; pt to monitor; if SBP > 130 or DBP > 85 add antihypertensive.  Problem # 7:  COPD (ICD-496)  Patient Instructions: 1)  Your physician recommends that you continue on your current medications as directed. Please refer to the Current Medication list given to you today. 2)  Your physician wants you to follow-up in: 1 year.  You will receive a reminder letter in the mail two months in advance. If you don't receive a letter, please call our office to schedule the follow-up appointment. 3)  Your physician has requested that  you have an abdominal aorta duplex. During this test, an ultrasound is used to evaluate the aorta. Allow 30 minutes for this exam. Do not eat after midnight the day before and avoid carbonated beverages. There are no restrictions or special instructions.

## 2010-11-28 NOTE — Letter (Signed)
Summary: Letter Regarding AIM HIGH Trial  Letter Regarding AIM HIGH Trial   Imported By: Lanelle Bal 04/07/2010 12:44:43  _____________________________________________________________________  External Attachment:    Type:   Image     Comment:   External Document

## 2010-11-28 NOTE — Assessment & Plan Note (Signed)
Summary: 6 months/apc   Primary Provider/Referring Provider:  Marga Melnick, MD  CC:  6 month follow up visit.  History of Present Illness:  12/31/08- ILD, [place] dust exposure Has gone to swim class and rehab today- exercise. CT- 12/07/08- Stable IPF and nodules PFT- WNL except reduced DLCO - Normal oxygentation. Once in a great while has a coughing spell- nonproductive. Breathing does not wake her at night. She expects seasonal allergy and will call as needed. Smoking cessation was again encouraged.  August 04, 2009 ILD, [employer] group, Tobacco user -follow up visit-good and bad days; No SOB and slight wheezing.  Sneezing a lot- Fall allergy, but less nasal congestion than during the summer. Coughs mostly dry, but once a day may cough up clear mucus.  Breathing doesn't affect sleep. Feels less energy, easier fatigue with exertion, not sure if this is dyspnea or some other change. If gets up early the day may be harder. Often fatigues and takes a nap. We reviewed her PFT and CT from last winter.Trying to wean cigarettes. Scheduled for Nuc Med Stress Test- Dr Jens Som Pending left shoulder surgery GOT- Dr [name].   Mar 02, 2010-  Blames allergy for increased cough that put her to bed. had some fever, clogged heard, rhinorhea, headache, but not sore throat. Dry cough She got a tussive pain at her left anterior rib margin that has eased. She took left over antibiotic that she quit when it gave diarrhea and nausea- now resolved. Smoking 1/2 PPD. she takes allegra but no repiratory meds. During the winter she had left shoulder surgery without respiratory complication, and she had some dental extraction.    Current Medications (verified): 1)  Plavix 75 Mg Tabs (Clopidogrel Bisulfate) .Marland Kitchen.. 1 Tablet Daily*office Visit Due Now 2)  Zoloft 50 Mg Tabs (Sertraline Hcl) .Marland Kitchen.. 1 By Mouth Qd 3)  Zocor 40 Mg Tabs (Simvastatin) .Marland Kitchen.. 1 By Mouth Qd 4)   Multivitamins   Tabs (Multiple Vitamin) .... Take 1 Tablet By Mouth Once A Day 5)  Allegra 180 Mg  Tabs (Fexofenadine Hcl) .... Take 1 Tablet By Mouth Once A Day As Needed 6)  Niacin Cr 1000 Mg  Tbcr (Niacin) .... 3tabs Once Daily 7)  Fish Oil 300 Mg  Caps (Omega-3 Fatty Acids) .... Take 1 Capsule By Mouth Once A Day 8)  Folic Acid 400 Mcg Tabs (Folic Acid) .Marland Kitchen.. 1 Tab By Mouth Once Daily 9)  Omeprazole 20 Mg  Cpdr (Omeprazole) .... By Mouth Daily. Take One Half Hour Before Eating.  Allergies (verified): No Known Drug Allergies  Past History:  Past Medical History: Last updated: 08/26/2009 PERIPHERAL VASCULAR DISEASE (ICD-443.9) HYPERLIPIDEMIA (ICD-272.4) CORONARY ARTERY DISEASE (ICD-414.00) TINNITUS, CHRONIC (ICD-388.30) HEMORRHOIDS, INTERNAL (ICD-455.0) IRRITABLE BOWEL SYNDROME (ICD-564.1) HEARING LOSS, HIGH FREQUENCY (ICD-389.8) GASTRITIS, CHRONIC (ICD-535.10) NEOPLASM, BENIGN, ESOPHAGUS (ICD-211.0) HIATAL HERNIA (ICD-553.3) DIVERTICULOSIS, COLON (ICD-562.10) CAROTID ARTERY DISEASE (ICD-433.10) OTHER NONTHROMBOCYTOPENIC PURPURAS (ICD-287.2) PULMONARY NODULE, SOLITARY (ICD-518.89) PULMONARY FIBROSIS, IDIOPATHIC (ICD-516.3) NONDEPENDENT TOBACCO USE DISORDER (ICD-305.1) CHEST XRAY, ABNORMAL (ICD-793.1) COPD (ICD-496) VERTIGO (ICD-780.4) DEPRESSION (ICD-311) GERD (ICD-530.81) Rotator Cuff,S/P PT [place] Exposure Surveillance  Family History: Last updated: 08-29-2009 Father:MI  Mother: deceased age 13 colitis, heart disease (CHF) Father: deceased age 24; colon cancer and heart problems. Siblings:sister colitis age 63 living allergies.              brother: age 71 deceased; war  aunts and uncles MI No lung disease x grandchildren Family History of  Colon Cancer: Father Family History of Breast Cancer: Great Aunt  Social History: Last updated: 12/27/2008 Patient is a current smoker- 1/2 ppd Goes to [place] twice a year to visit sister- 3-4 weeks travel there  every year. 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   Risk Factors: Exercise: no (12/27/2008)  Risk Factors: Smoking Status: current (11/20/2007) Packs/Day: 1/2 (11/20/2007)  Past Surgical History: colonoscopy last completed 10/2005 foot surgery-bilateral toe surgery-right foot endarectomy- carotid Left side x 3, saphenous vein graft from left leg gravid 8 para 6 mis 2 tonsillectomy Left shoulder surgery  dental extractions  Review of Systems      See HPI       The patient complains of dyspnea on exertion.  The patient denies anorexia, fever, weight loss, weight gain, vision loss, decreased hearing, hoarseness, chest pain, syncope, peripheral edema, prolonged cough, headaches, hemoptysis, abdominal pain, and severe indigestion/heartburn.    Vital Signs:  Patient profile:   77 year old female Height:      58.25 inches Weight:      147 pounds BMI:     30.57 O2 Sat:      94 % on Room air Pulse rate:   94 / minute BP sitting:   114 / 70  (right arm) Cuff size:   regular  Vitals Entered By: Reynaldo Minium CMA (Mar 02, 2010 2:34 PM)  O2 Flow:  Room air  Physical Exam  Additional Exam:  General: A/Ox3; pleasant and cooperative, NAD, mild overweight SKIN: no rash, lesions NODES: no lymphadenopathy HEENT: [place]/AT, EOM- WNL, Conjuctivae- clear, PERRLA, TM-WNL, Nose- clear, Throat- clear and wnl, Mallampati III NECK: Supple w/ fair ROM, JVD- none, normal carotid impulses w/o bruits Thyroid- normal to palpation, left anterior neck scar from endarterectromy. CHEST: Clear to P&A,very faint fine crackle bilaterally, unlabored HEART: RRR, no m/g/r heard ABDOMEN:  LKG:MWNU, nl pulses, no edema , no clubbing or cyanosis NEURO: Grossly intact to observation. thought process seems clearer      Impression & Recommendations:  Problem # 1:  PULMONARY FIBROSIS (ICD-515) I don't see much change  clinically.  She has tussive sharp left upper abdominal wall pain- almost certainly a torn muscle- resolving now. heat helps.  Had last pneumovax in 2001, but has had more than one -should be sufficient.  Problem # 2:  NONDEPENDENT TOBACCO USE DISORDER (ICD-305.1)  We continue to press for effort to stop smoking  Problem # 3:  PULMONARY NODULE, SOLITARY (ICD-518.89)  CXR in October, 2010 could not resolve the nodule seen on prior CT. We discussed radiation and cost, feeling that surveillance with CXR would be sufficient as long as nothing was growing enough to be seen on CXR.  Other Orders: Est. Patient Level III (27253) Tobacco use cessation intermediate 3-10 minutes (66440)  Patient Instructions: 1)  Please schedule a follow-up appointment in 6 months. 2)  Heat and tylenol should help the pain in your side. If it doesn't go away let us know. 3)  Please do your self a favor and quit smoking. The otc patches really do help people- suggest trying the 14 mg strength.

## 2010-11-28 NOTE — Assessment & Plan Note (Signed)
Summary: cant hear out of left ear/kdc   Vital Signs:  Patient profile:   77 year old female Weight:      151.6 pounds Temp:     97.6 degrees F oral Pulse rate:   76 / minute Resp:     17 per minute BP sitting:   114 / 72  (left arm) Cuff size:   large  Vitals Entered By: Shonna Chock (December 08, 2009 11:44 AM) CC: Unable to hear out of left ear  Comments REVIEWED MED LIST, PATIENT AGREED DOSE AND INSTRUCTION CORRECT    Primary Care Provider:  Marga Melnick, MD  CC:  Unable to hear out of left ear .  History of Present Illness: Decreased hearing 12/06/2009 acutely with tinnitus  in context of URI X 1 week.  She started Amox Rxed by Dr Manson Passey  02/07 for dental pain; extractions  scheduled.  Allergies (verified): No Known Drug Allergies  Review of Systems General:  Denies chills, fever, and sweats. ENT:  Complains of nasal congestion and sinus pressure; L max area pressure. No frontal headache or purulence. CV:  Denies lightheadness and near fainting. Neuro:  Denies headaches, numbness, tingling, and weakness.  Physical Exam  General:  in no acute distress; alert,appropriate and cooperative throughout examination Eyes:  No corneal or conjunctival inflammation noted. EOMI. Perrla. No nystagmus Ears:  Impactions bilaterally , L>R. Tuning fork exam normal Nose:  External nasal examination shows no deformity or inflammation. Nasal mucosa are pink and moist without lesions or exudates. Mouth:  Oral mucosa and oropharynx without lesions or exudates.   Some gum erosion Cervical Nodes:  No lymphadenopathy noted Axillary Nodes:  No palpable lymphadenopathy   Impression & Recommendations:  Problem # 1:  HEARING LOSS (ICD-389.9)  Problem # 2:  CERUMEN IMPACTION, BILATERAL (ICD-380.4)  Complete Medication List: 1)  Plavix 75 Mg Tabs (Clopidogrel bisulfate) .Marland Kitchen.. 1 tablet daily*office visit due now 2)  Zoloft 50 Mg Tabs (Sertraline hcl) .Marland Kitchen.. 1 by mouth qd 3)  Zocor 40 Mg Tabs  (Simvastatin) .Marland Kitchen.. 1 by mouth qd 4)  Multivitamins Tabs (Multiple vitamin) .... Take 1 tablet by mouth once a day 5)  Allegra 180 Mg Tabs (Fexofenadine hcl) .... Take 1 tablet by mouth once a day as needed 6)  Niacin Cr 1000 Mg Tbcr (Niacin) .... 3tabs once daily 7)  Fish Oil 300 Mg Caps (Omega-3 fatty acids) .... Take 1 capsule by mouth once a day 8)  Folic Acid 400 Mcg Tabs (Folic acid) .Marland Kitchen.. 1 tab by mouth once daily 9)  Omeprazole 20 Mg Cpdr (Omeprazole) .... By mouth daily. take one half hour before eating.  Patient Instructions: 1)  Regimen for ear wax as written. D/C Qtips.ENT referral if no better.SAline wash once daily R nare to L.

## 2010-11-28 NOTE — Assessment & Plan Note (Signed)
Summary: review lab /cbs   Vital Signs:  Patient profile:   77 year old female Weight:      146.4 pounds Temp:     98.5 degrees F oral Pulse rate:   76 / minute Resp:     16 per minute BP sitting:   100 / 62  (left arm) Cuff size:   regular  Vitals Entered By: Shonna Chock CMA (June 09, 2010 2:57 PM) CC: Follow-up visit: Discuss labs from a Wake study (chlosterol), Lipid Management   Primary Care Provider:  Marga Melnick, MD  CC:  Follow-up visit: Discuss labs from a Wake study (chlosterol) and Lipid Management.  History of Present Illness: She has completed AIM High Study ; [facility] records reviewed:LDL 108 ? 6 weeks off Niacin  but still on Zocor 40 mg at bedtime.Serial labs on Niacin & Zocor were much better. The patient reports fatigue ? from the heat, but denies muscle aches, GI upset, abdominal pain, flushing, itching, constipation, and diarrhea.  Other symptoms include dypsnea @ high levels of CVE only.  The patient denies the following symptoms: chest pain/pressure, exercise intolerance, palpitations, syncope, and pedal edema.  Compliance with medications (by patient report) has been near 100%.  Dietary compliance has been fair.  The patient reports exercising 3 X per week for 2 hrs as  fibromyalgia /water aerobics class.  Adjunctive measures currently used by the patient include ASA and fish oil supplements. She is still smoking 1/2 ppd; event  risk was   discussed.  Lipid Management History:      Positive NCEP/ATP III risk factors include female age 78 years old or older, current tobacco user, hypertension, ASHD (either angina/prior MI/prior CABG), and peripheral vascular disease.  Negative NCEP/ATP III risk factors include no history of early menopause without estrogen hormone replacement, non-diabetic, and no family history for ischemic heart disease.     Current Medications (verified): 1)  Plavix 75 Mg Tabs (Clopidogrel Bisulfate) .Marland Kitchen.. 1 Tablet Daily*office Visit Due  Now 2)  Zoloft 50 Mg Tabs (Sertraline Hcl) .Marland Kitchen.. 1 By Mouth Qd 3)  Zocor 40 Mg Tabs (Simvastatin) .Marland Kitchen.. 1 By Mouth Qd 4)  Multivitamins   Tabs (Multiple Vitamin) .... Take 1 Tablet By Mouth Once A Day 5)  Allegra 180 Mg  Tabs (Fexofenadine Hcl) .... Take 1 Tablet By Mouth Once A Day As Needed 6)  Fish Oil 300 Mg  Caps (Omega-3 Fatty Acids) .... Take 1 Capsule By Mouth Once A Day 7)  Folic Acid 400 Mcg Tabs (Folic Acid) .Marland Kitchen.. 1 Tab By Mouth Once Daily 8)  Omeprazole 20 Mg  Cpdr (Omeprazole) .... By Mouth Daily. Take One Half Hour Before Eating.  Allergies (verified): No Known Drug Allergies  Physical Exam  General:  Appears younger than age,well-nourished,in no acute distress; alert,appropriate and cooperative throughout examination Lungs:  Normal respiratory effort, chest expands symmetrically. Lungs are clear to auscultation, no crackles or wheezes. Heart:  Normal rate and regular rhythm. S1 and S2 normal without gallop, murmur, click, rub.S4 Pulses:  R and L carotid,radial,dorsalis pedis and posterior tibial pulses are full and equal bilaterally. Bilateral faint carotid bruits Neurologic:  alert & oriented X3.   Psych:  memory intact for recent and remote, normally interactive, and good eye contact.     Impression & Recommendations:  Problem # 1:  HYPERLIPIDEMIA (ICD-272.4)  The following medications were removed from the medication list:    Niacin Cr 1000 Mg Tbcr (Niacin) .Marland KitchenMarland KitchenMarland KitchenMarland Kitchen 3tabs once daily Her updated medication  list for this problem includes:    Zocor 40 Mg Tabs (Simvastatin) .Marland Kitchen... 1 by mouth qd  Problem # 2:  SMOKER (ICD-305.1)  Problem # 3:  HYPERTENSION (ICD-401.9)  Complete Medication List: 1)  Plavix 75 Mg Tabs (Clopidogrel bisulfate) .Marland Kitchen.. 1 tablet daily*office visit due now 2)  Zoloft 50 Mg Tabs (Sertraline hcl) .Marland Kitchen.. 1 by mouth qd 3)  Zocor 40 Mg Tabs (Simvastatin) .Marland Kitchen.. 1 by mouth qd 4)  Multivitamins Tabs (Multiple vitamin) .... Take 1 tablet by mouth once a  day 5)  Allegra 180 Mg Tabs (Fexofenadine hcl) .... Take 1 tablet by mouth once a day as needed 6)  Fish Oil 300 Mg Caps (Omega-3 fatty acids) .... Take 1 capsule by mouth once a day 7)  Folic Acid 400 Mcg Tabs (Folic acid) .Marland Kitchen.. 1 tab by mouth once daily 8)  Omeprazole 20 Mg Cpdr (Omeprazole) .... By mouth daily. take one half hour before eating.  Lipid Assessment/Plan:      Based on NCEP/ATP III, the patient's risk factor category is "history of coronary disease, peripheral vascular disease, cerebrovascular disease, or aortic aneurysm along with either diabetes, current smoker, or LDL > 130 plus HDL < 40 plus triglycerides > 200".  The patient's lipid goals are as follows: Total cholesterol goal is 200; LDL cholesterol goal is 70; HDL cholesterol goal is 40; Triglyceride goal is 150.    Patient Instructions: 1)  Consider a fasting NMR Lipoprofile Lipid Panel ( Code : 272.4) to optimally assess risk. Consider Smoking Cessation Program [phone]). 2)  It is important that you exercise regularly at least 20 minutes 5 times a week. If you develop chest pain, have severe difficulty breathing, or feel very tired , stop exercising immediately and seek medical attention. 3)  Take an  81 mg coated Aspirin every day.

## 2010-11-30 ENCOUNTER — Encounter: Payer: Self-pay | Admitting: Internal Medicine

## 2010-11-30 ENCOUNTER — Ambulatory Visit (HOSPITAL_COMMUNITY): Payer: Self-pay

## 2010-11-30 ENCOUNTER — Ambulatory Visit (HOSPITAL_COMMUNITY)
Admission: RE | Admit: 2010-11-30 | Discharge: 2010-11-30 | Disposition: A | Discharge status: Home / Self Care | Payer: Medicare Other | Source: Ambulatory Visit | Attending: Internal Medicine | Admitting: Internal Medicine

## 2010-11-30 DIAGNOSIS — Z1239 Encounter for other screening for malignant neoplasm of breast: Secondary | ICD-10-CM

## 2010-11-30 DIAGNOSIS — Z1231 Encounter for screening mammogram for malignant neoplasm of breast: Secondary | ICD-10-CM | POA: Insufficient documentation

## 2010-11-30 NOTE — Letter (Signed)
Summary: Colonoscopy-Changed to Office Visit Letter  [facility]  [address]   [place], [place] [address]   Phone: [phone]  Fax: [phone]      November 21, 2010 MRN: [id number]   Linda Kelly [address] [place], [place]  [address]   Dear Linda Kelly,   According to our records, it is time for you to schedule a Colonoscopy. However, after reviewing your medical record, I feel that an office visit would be most appropriate to more completely evaluate you and determine your need for a repeat procedure.  Please call [phone] (option #2) at your convenience to schedule an office visit. If you have any questions, concerns, or feel that this letter is in error, we would appreciate your call.   Sincerely,   Stan Head, M.D.  [facility] [phone]

## 2010-11-30 NOTE — Assessment & Plan Note (Signed)
**Note De-Identified  Obfuscation** Summary: rov/jd   Primary Provider/Referring Provider:  Marga Melnick, MD  CC:  Follow up visit-Pulmonary Fibrosis and Nodule-hard breathing at times-SOB.Marland Kitchen  History of Present Illness: August 04, 2009 ILD, [employer] group, Tobacco user -follow up visit-good and bad days; No SOB and slight wheezing.  Sneezing a lot- Fall allergy, but less nasal congestion than during the summer. Coughs mostly dry, but once a day may cough up clear mucus.  Breathing doesn't affect sleep. Feels less energy, easier fatigue with exertion, not sure if this is dyspnea or some other change. If gets up early the day may be harder. Often fatigues and takes a nap. We reviewed her PFT and CT from last winter.Trying to wean cigarettes. Scheduled for Nuc Med Stress Test- Dr Jens Som Pending left shoulder surgery GOT- Dr [name].   Mar 02, 2010- ILD, [employer] group, Tobacco user Blames allergy for increased cough that put her to bed. had some fever, clogged heard, rhinorhea, headache, but not sore throat. Dry cough She got a tussive pain at her left anterior rib margin that has eased. She took left over antibiotic that she quit when it gave diarrhea and nausea- now resolved. Smoking 1/2 PPD. she takes allegra but no repiratory meds. During the winter she had left shoulder surgery without respiratory complication, and she had some dental extraction.  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 Feels more DOE with ADLs than a year ago, with some cough and wheeze. Recently had her [place] lab updates and will have the physical exam done at [facility]. She has not had CXR. Smoking still 1/2 PD. Discussed quitting. She will sometimes stop before finishing cigarette.    Preventive Screening-Counseling &  Management  Alcohol-Tobacco     Alcohol type: WINE,LIQOUR SOCIAL     Smoking Status: current     Smoking Cessation Counseling: yes     Smoke Cessation Stage: precontemplative     Packs/Day: 1/2     Year Started: 1970's     Tobacco Counseling: to quit use of tobacco products  Current Medications (verified): 1)  Plavix 75 Mg Tabs (Clopidogrel Bisulfate) .Marland Kitchen.. 1 Tablet Daily*office Visit Due Now 2)  Zoloft 50 Mg Tabs (Sertraline Hcl) .Marland Kitchen.. 1 By Mouth Qd 3)  Zocor 40 Mg Tabs (Simvastatin) .Marland Kitchen.. 1 By Mouth Qd 4)  Multivitamins   Tabs (Multiple Vitamin) .... Take 1 Tablet By Mouth Once A Day 5)  Allegra 180 Mg  Tabs (Fexofenadine Hcl) .... Take 1 Tablet By Mouth Once A Day As Needed 6)  Fish Oil 300 Mg  Caps (Omega-3 Fatty Acids) .... Take 1 Capsule By Mouth Once A Day 7)  Folic Acid 400 Mcg Tabs (Folic Acid) .Marland Kitchen.. 1 Tab By Mouth Once Daily 8)  Omeprazole 20 Mg  Cpdr (Omeprazole) .... By Mouth Daily. Take One Half Hour Before Eating.  Allergies (verified): No Known Drug Allergies  Past History:  Past Medical History: Last updated: 08/10/2010 PERIPHERAL VASCULAR DISEASE (ICD-443.9) HYPERLIPIDEMIA (ICD-272.4) CORONARY ARTERY DISEASE (ICD-414.00) TINNITUS, CHRONIC (ICD-388.30) HEMORRHOIDS, INTERNAL (ICD-455.0) IRRITABLE BOWEL SYNDROME (ICD-564.1) HEARING LOSS, HIGH FREQUENCY (ICD-389.8) GASTRITIS, CHRONIC (ICD-535.10) NEOPLASM, BENIGN, ESOPHAGUS (ICD-211.0) HIATAL HERNIA (ICD-553.3) DIVERTICULOSIS, COLON (ICD-562.10) CAROTID ARTERY DISEASE (ICD-433.10) OTHER NONTHROMBOCYTOPENIC PURPURAS (ICD-287.2) PULMONARY NODULE, SOLITARY (ICD-518.89) PULMONARY FIBROSIS, IDIOPATHIC (ICD-516.3) COPD (ICD-496) VERTIGO (ICD-780.4) DEPRESSION (ICD-311) GERD (ICD-530.81) Rotator Cuff,S/P PT [employer] **Note De-Identified  Obfuscation** Exposure Surveillance  Past Surgical History: Last updated: 03/02/2010 colonoscopy last completed 10/2005 foot surgery-bilateral toe surgery-right foot endarectomy- carotid Left side x 3,  saphenous vein graft from left leg gravid 8 para 6 mis 2 tonsillectomy Left shoulder surgery  dental extractions  Family History: Last updated: 07-Aug-2009 Father:MI  Mother: deceased age 28 colitis, heart disease (CHF) Father: deceased age 70; colon cancer and heart problems. Siblings:sister colitis age 99 living allergies.              brother: age 52 deceased; war  aunts and uncles MI No lung disease x grandchildren Family History of Colon Cancer: Father Family History of Breast Cancer: Great Aunt  Social History: Last updated: 12/27/2008 Patient is a current smoker- 1/2 ppd Goes to [place] twice a year to visit sister- 3-4 weeks travel there every year. 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   Risk Factors: Exercise: no (12/27/2008)  Risk Factors: Smoking Status: current (10/10/2010) Packs/Day: 1/2 (10/10/2010)  Review of Systems      See HPI       The patient complains of shortness of breath with activity.  The patient denies shortness of breath at rest, productive cough, non-productive cough, coughing up blood, chest pain, irregular heartbeats, acid heartburn, indigestion, loss of appetite, weight change, abdominal pain, difficulty swallowing, sore throat, tooth/dental problems, headaches, nasal congestion/difficulty breathing through nose, and sneezing.    Vital Signs:  Patient profile:   77 year old female Height:      58.25 inches Weight:      154 pounds BMI:     32.03 O2 Sat:      92 % on Room air Pulse rate:   89 / minute BP sitting:   138 / 78  (left arm) Cuff size:   regular  Vitals Entered By: Reynaldo Minium CMA (October 10, 2010 10:58 AM)  O2 Flow:  Room air CC: Follow up visit-Pulmonary Fibrosis and Nodule-hard breathing at times-SOB.   Physical Exam  Additional Exam:  General: A/Ox3; pleasant and cooperative, NAD, mild overweight SKIN: no rash, lesions NODES:  no lymphadenopathy HEENT: [place]/AT, EOM- WNL, Conjuctivae- clear, PERRLA, TM-WNL, Nose- clear, Throat- clear and wnl, Mallampati III NECK: Supple w/ fair ROM, JVD- none, normal carotid impulses w/o bruits Thyroid- normal to palpation, left anterior neck scar from endarterectromy. CHEST:  crackles  bilaterally, unlabored HEART: RRR, no m/g/r heard ABDOMEN:  NFA:OZHY, nl pulses, no edema , no clubbing or cyanosis NEURO: Grossly intact to observation.      Impression & Recommendations:  Problem # 1:  PULMONARY FIBROSIS (ICD-515) Not sure how much of this actually can be related back to her 911 exposure. We will update CXR. I would like to get PFT but not duplicate [facility] testing unneccessarily. We may be able to get some of their results once completed.   Problem # 2:  NONDEPENDENT TOBACCO USE DISORDER (ICD-305.1)  We have again discussed smoking cessation and will direct her to the [facility] support program.   Other Orders: Est. Patient Level III (99213) T-2 View CXR (71020TC)  Patient Instructions: 1)  Please schedule a follow-up appointment in 6 months. 2)  We will ask the [facility] office for results of your breathing test.  3)  Information on [facility] smoking cesation program. Please try to stop smoking.  4)  A chest x-ray has been recommended.  Your imaging study may require preauthorization.

## 2010-12-22 ENCOUNTER — Ambulatory Visit (INDEPENDENT_AMBULATORY_CARE_PROVIDER_SITE_OTHER): Payer: Medicare Other | Admitting: Internal Medicine

## 2010-12-22 ENCOUNTER — Encounter: Payer: Self-pay | Admitting: Internal Medicine

## 2010-12-22 DIAGNOSIS — M542 Cervicalgia: Secondary | ICD-10-CM

## 2010-12-22 DIAGNOSIS — R1011 Right upper quadrant pain: Secondary | ICD-10-CM

## 2010-12-22 DIAGNOSIS — R32 Unspecified urinary incontinence: Secondary | ICD-10-CM | POA: Insufficient documentation

## 2010-12-22 DIAGNOSIS — K029 Dental caries, unspecified: Secondary | ICD-10-CM | POA: Insufficient documentation

## 2010-12-22 DIAGNOSIS — F17200 Nicotine dependence, unspecified, uncomplicated: Secondary | ICD-10-CM

## 2011-01-04 NOTE — Assessment & Plan Note (Signed)
Summary: neck pain, other issue,review lab/cbs   Vital Signs:  Patient profile:   77 year old female Weight:      154.8 pounds BMI:     32.19 Temp:     98.0 degrees F oral Pulse rate:   84 / minute Resp:     16 per minute BP sitting:   112 / 70  (left arm) Cuff size:   regular  Vitals Entered By: Shonna Chock CMA (December 22, 2010 3:23 PM) CC: 1.) Neck pain  2.) Lab results   3.) Other issues    Primary Care Provider:  Marga Melnick, MD  CC:  1.) Neck pain  2.) Lab results   3.) Other issues .  History of Present Illness:    Onset as L posterior throbbing neck  pain 2-3 weeks ago upon awakening. Associated symptoms include  minor gait disturbance and impaired neck ROM.  The patient denies the following associated symptoms: numbness, weakness, impaired coordination, tingling/parasthesias, fever, bladder dysfunction, and bowel dysfunction.  The pain is now described as constant.  The pain is better NOT  with acetaminophen , but it is better  postural change &  using a pillow to support neck.  No PMH of neck problems or significant injury.                                                                                                                              At her  request  10/2010  labs done because of 9/11 exposures were reviewed (reprts scanned).TG were 170. Role of HFCS sugar in raising TG dicussed. PFTs WNL despite 1/2 ppd smoking.She believes CXray also done ; but no typed report found among extensive reports. UA was abnormal;no culture was collected.She does not have GU symptoms other than some chronic  incontinence which preceded neck symptoms  & is on meds from her Gyn with some  improvement.                                                              Dr      Gwendlyn Deutscher has recommended extraction of upper teeth due to root abscesses. This will necessititate discontinuation of Plavix which was Rxed because of carotid vascular disease for which she has had endarterectomies.  Unfortunately she continues to [facility] with associated risk of stroke or MI 2-3X normal.  Her final concern  as I was leaving exam room was LUQ / lower thoracic discomfort.See ROS.Anti GERD/HH measures recommended.                                     Current Medications (verified): 1)  Plavix 75 Mg Tabs (Clopidogrel Bisulfate) .Marland Kitchen.. 1 Tablet Daily*office Visit Due Now 2)  Zoloft 50 Mg Tabs (Sertraline Hcl) .Marland Kitchen.. 1 By Mouth Qd 3)  Zocor 40 Mg Tabs (Simvastatin) .Marland Kitchen.. 1 By Mouth Qd 4)  Multivitamins   Tabs (Multiple Vitamin) .... Take 1 Tablet By Mouth Once A Day 5)  Allegra 180 Mg  Tabs (Fexofenadine Hcl) .... Take 1 Tablet By Mouth Once A Day As Needed 6)  Fish Oil 300 Mg  Caps (Omega-3 Fatty Acids) .... Take 1 Capsule By Mouth Once A Day 7)  Folic Acid 400 Mcg Tabs (Folic Acid) .Marland Kitchen.. 1 Tab By Mouth Once Daily 8)  Omeprazole 20 Mg  Cpdr (Omeprazole) .... By Mouth Daily. Take One Half Hour Before Eating.  Allergies (verified): No Known Drug Allergies  Review of Systems General:  Denies chills and fever; Night sweats. ENT:  Upper teeth are rotten beneath caps with abscesses. Plavix must be stopped for 1 week pre op. CVA or MI risk off Plavix discussed. That risk is less likely than potential cns infection(bacterial meningitis) from abscesses. GI:  Denies bloody stools, dark tarry stools, and indigestion; occasional LUQ pain; no dysphagia. GU:  Denies discharge, dysuria, and hematuria.  Physical Exam  General:  in no acute distress; alert,appropriate and cooperative throughout examination Eyes:  No corneal or conjunctival inflammation noted. EOMI. Perrla. Field of   Vision grossly normal. Mouth:  Oral mucosa and oropharynx without lesions or exudates.  Teeth grossly in  in good repair; no visable evidence of active abscess. No  tongue deviation Neck:   No deformities, masses, or tenderness notedShe describes .pain with  L lateral rotation Heart:  normal rate, regular rhythm, no gallop, no rub, no JVD, and grade 1 /6 systolic murmur.   Pulses:  R and L carotid,radial  pulses are full and equal bilaterally. Bilat carotid bruits Extremities:  No clubbing, cyanosis, edema. OA DIP changes Neurologic:   oriented X3, cranial nerves II-XII intact, strength normal in all extremities, sensation intact to light touch, gait normal, DTRs symmetrical and normal, finger-to-nose normal, heel-to-shin normal, and Romberg negative.  RAM & rapid alliterative speech normal Skin:  Intact without suspicious lesions or rashes Cervical Nodes:  No lymphadenopathy noted Axillary Nodes:  No palpable lymphadenopathy Psych:  memory intact for recent and remote, flat affect, and subdued. She serially  introduced multiple health concerns after reason for scheduled acute visit ( neck pain) was addressed   Impression & Recommendations:  Problem # 1:  NECK PAIN, LEFT (ICD-723.1)  M-S w/o neurologic deficit  Her updated medication list for this problem includes:    Cyclobenzaprine Hcl 5 Mg Tabs (Cyclobenzaprine hcl) .Marland Kitchen... 1-2 at bedtime as needed for neck pain  Problem # 2:  SMOKER (ICD-305.1)   PFTs reviewed : normal; potential risks discussed  while off Plavix perioperatively   Orders: Tobacco use cessation intermediate 3-10 minutes (99406)  Problem # 3:  URINARY INCONTINENCE (ICD-788.30) as  per  Gyn   Problem # 4:  ABDOMINAL PAIN, RIGHT UPPER QUADRANT (ICD-789.01) probable HH/ GERD  Problem # 5:  DENTAL CARIES (ICD-521.00) Plavix interruption as per Dr Manson Passey ;  resume ASAP post op a allowed  Complete Medication List: 1)  Plavix 75 Mg Tabs (Clopidogrel bisulfate) .Marland Kitchen.. 1 tablet daily*office visit due now 2)  Zoloft 50 Mg Tabs (Sertraline hcl) .Marland Kitchen.. 1 by mouth qd 3)  Zocor 40 Mg Tabs (Simvastatin) .Marland Kitchen.. 1 by mouth qd 4)  Multivitamins Tabs (Multiple vitamin)  .... Take 1 tablet by mouth once a day 5)  Allegra 180 Mg Tabs (Fexofenadine hcl) .... Take 1 tablet by mouth once a day as needed 6)  Fish Oil 300 Mg Caps (Omega-3 fatty acids) .... Take 1 capsule by mouth once a day 7)  Folic Acid 400 Mcg Tabs (Folic acid) .Marland Kitchen.. 1 tab by mouth once daily 8)  Omeprazole 20 Mg Cpdr (Omeprazole) .... By mouth daily. take one half hour before eating. 9)  Cyclobenzaprine Hcl 5 Mg Tabs (Cyclobenzaprine hcl) .Marland Kitchen.. 1-2 at bedtime as needed for neck pain  Patient Instructions: 1)  Zostrix Cream OR heaing pad 2-3 X / day for neck. Chiropractry if no better. 2)  Stop Smoking Tips: Choose a Quit date. Cut down before the Quit date. decide what you will do as a substitute when you feel the urge to smoke(gum,toothpick,exercise). 3)  Avoid foods high in acid (tomatoes, citrus juices, spicy foods). Avoid eating within two hours of lying down or before exercising. Do not over eat; try smaller more frequent meals. Elevate head of bed twelve inches when sleeping. Omeprazole two times a day as needed if pain persists. Prescriptions: CYCLOBENZAPRINE HCL 5 MG TABS (CYCLOBENZAPRINE HCL) 1-2 at bedtime as needed for neck pain  #20 x 0   Entered and Authorized by:   Marga Melnick MD   Signed by:   Marga Melnick MD on 12/22/2010   Method used:   Electronically to        [facility] [phone]* (retail)       [address]       [place], [place]  [address]       Ph: [phone]       Fax: [phone]   RxID:   [id number]    Orders Added: 1)  Est. Patient Level IV [32202] 2)  Tobacco use cessation intermediate 3-10 minutes [99406]

## 2011-01-31 LAB — BASIC METABOLIC PANEL
BUN: 10 mg/dL (ref 6–23)
Calcium: 9.3 mg/dL (ref 8.4–10.5)
Creatinine, Ser: 0.67 mg/dL (ref 0.4–1.2)
GFR calc Af Amer: >60 mL/min (ref >60)

## 2011-01-31 LAB — POCT HEMOGLOBIN-HEMACUE: Hemoglobin: 13.6 g/dL (ref 12.0–15.0)

## 2011-03-13 NOTE — Procedures (Signed)
CAROTID DUPLEX EXAM   INDICATION:  Followup carotid artery disease.   HISTORY:  Diabetes:  No.  Cardiac:  Yes, MI.  Hypertension:  No.  Smoking:  Yes.  Previous Surgery:  Left CEA with DPA 04/16/1994, re-do 11/17/1995, re-do  12/17/1997 by Dr. Arbie Cookey.  CV History:  No.  Amaurosis Fugax No, Paresthesias No, Hemiparesis No                                       RIGHT             LEFT  Brachial systolic pressure:         134               130  Brachial Doppler waveforms:         Triphasic         Triphasic  Vertebral direction of flow:        Antegrade         Antegrade  DUPLEX VELOCITIES (cm/sec)  CCA peak systolic                   59                M/D=259/59  ECA peak systolic                   257               147  ICA peak systolic                   230               58  ICA end diastolic                   66                22  PLAQUE MORPHOLOGY:                  Mixed             Homogenous  PLAQUE AMOUNT:                      Moderate/severe   Moderate/severe  PLAQUE LOCATION:                    ICA/ECA/CCA       At vein patch   IMPRESSION:  1. The right ICA shows evidence of 60-79% stenosis.  2. The left ICA shows evidence of 20-39% stenosis.  3. Bilateral ECA stenosis R>L.  4. Left CCA elevated velocities in M/D CCA at 259/59 cm/s with      proximal to it at 53 cm/s, and distal to it at 76 cm/s.   ___________________________________________  Larina Earthly, M.D.   AS/MEDQ  D:  11/28/2007  T:  11/28/2007  Job:  [id number]

## 2011-03-13 NOTE — Assessment & Plan Note (Signed)
[facility]                         GASTROENTEROLOGY OFFICE NOTE   Linda Kelly, Linda Kelly                          MRN:          [id number]  DATE:01/15/2008                            DOB:          09/15/1928    CHIEF COMPLAINT:  Abdominal pain, nausea, vomiting, and diarrhea.   HISTORY OF PRESENT ILLNESS:  The patient is a 77 year old white woman  who has had a number of years, at least five if not more, of monthly  episodes of nocturnal nausea, vomiting, and diarrhea.  She says she  develops rhinorrhea, nausea, and watery eyes, knows that she has to get  to the bathroom, and then starts vomiting and having diarrhea.  It can  sometimes last for a couple of days.  She takes Lomotil to try to calm  this down.  It has been the same way for years and no medication has  helped it, though, I am not sure she has tried many or any prophylactic  agents.  There is no concomitant history of migraine headaches.  She has  had previous endoscopic evaluations in the past as well.  She has had  upper GI small bowel follow through.  She has a duodenal diverticulum.  She is leaving for [place] in about four days to be with her sister who  is a Marketing executive there.  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  In  between these spells she seems to be doing reasonably well.  In the past  she had a positive antigliadin antibody but the tissue transglutaminase  antibody was negative.  When she saw Ulyess Mort, M.D. she said  she had attacks every 7-10 days (this was last years' visit) but she  tells me it is about once a month and it is always in the middle of the  night.  There is no associated food or other precipitant that she can  tell.   MEDICATIONS:  1. Plavix 75 mg daily.  2. Tenormin 50 mg half daily.  3. Zocor 40 mg daily.  4. Zoloft 50 mg daily.  5. Fish Oil.  6. Aspirin.  7. Multivitamin.  8. Calcium.  9. Omeprazole p.r.n.  10.Allegra p.r.n.   ALLERGIES:  No known drug allergies.   PAST MEDICAL HISTORY/STUDIES:  1. Colonoscopy in January of 2007 showing diverticulosis.  2. EGD in 2003 showing hiatal hernia, polypoid lesion in the      esophagus.  3. Upper GI series showing a duodenal diverticulum and hiatal hernia,      rapid transit with a small bowel follow through.  4. Solitary pulmonary nodule through to be benign.  5. Idiopathic pulmonary fibrosis.  6. Smoker.  7. Chronic obstructive pulmonary disease.  8. Peripheral vascular disease.  9. Coronary artery disease.  10.Hypertension.  11.History of vertigo.  12.Headaches, though she denied migraines.   PAST SURGICAL HISTORY:  Foot surgery, toe surgery, endarterectomy,  vascular stents question peripheral.  SOCIAL HISTORY:  She is widowed.  She is a Nurse, learning disability.  She has  been in the real estate business.   REVIEW OF SYSTEMS:  See my medical history form for full details.   PHYSICAL EXAMINATION:  VITAL SIGNS:  Height 4 feet 10 inches, weight 157  pounds, blood pressure 126/82.  ABDOMEN:  Obese.  Bowel sounds are present.  Soft and nontender without  organomegaly or mass.   ASSESSMENT:  Recurrent episodic nausea, vomiting, and diarrhea.   In the past it looks like she has had some more chronic diarrhea.  I  feel certain she has irritable bowel syndrome, though, I am not sure as  to what these spells are.  They would be very difficult to treat with a  daily chronic medication given that they only occur once a month and she  has failed other antispasmodics it seems.  Whatever it is, it does not  seem to be a serious health problem since she has had it for years and  has continued on.   RECOMMENDATIONS:  She is going to try some Levsin SL when these spells  occur.  Perhaps that could help her.  The combination of rhinorrhea,   nausea and vomiting, and watery diarrhea when searched is a cluster of  symptoms that occurs in the setting of nerve agent poisoning.  I do not  think she has that but it raises the question of whether there is some  sort of acute self limited cholinergic excess.  That would be a very odd  situation, but it is a thought.  She will try this and see and she was  cautioned about traveler's diarrhea when she goes to [place] and she will  come back to see me on an as needed basis.  I am sorry I have no clear  cut diagnosis other than a functional GI disturbance, but I think that  is probably what this is.     Iva Boop, MD,FACG  Electronically Signed    CEG/MedQ  DD: 01/15/2008  DT: 01/15/2008  Job #: [id number]

## 2011-03-13 NOTE — Assessment & Plan Note (Signed)
[facility]                         GASTROENTEROLOGY OFFICE NOTE   MARGRIT, MINNER                          MRN:          [id number]  DATE:03/12/2007                            DOB:          1927/12/08    Linda Kelly comes in on May 14 indicating that she has a URI which is pretty  bad, she is coughing a lot, but there is lack of fever, I suggested she  go see her primary care doctor.  She says she is still having these  nausea and vomiting episodes about once every 2 weeks.  She thinks the  stress might have something to do with it.  I wondered about her attacks  of loose bowel movements which she says also she gets once every 7-10  days, and then says the rest of the time she is fine.  She says it is  interesting that after she vomits on these occasions, she is nauseated  prior to that, and then she feels fine after the vomiting episodes  occur.  Almost sounds like underlying intestinal seizures or a motility  disorder of some sort.  Her other problems have been some anemia.  She  also had a positive gluten antibody, although the tissue  transglutaminase has been negative, but we never did do a small bowel  biopsy which I think might be important.  Another thing that needs to be  considered is a large duodenal diverticulum.  Maybe she is having  bacterial overgrowth problems with this that is causing the nausea, and  should be retreated with some antibiotics.  She is to see Dr. Leone Payor in  my retirement, and hopefully he can iron all this out for her.  I really  think it is somewhat of an irritable bowel-type process.  She could also  get the [facility] IBS study to see how this might correlate.  The other  problems might be from lactose intolerance or food intolerance from  allergies.  I did suggest that she get an ultrasound of her abdomen to  reassess her gallbladder, and get an upper GI series.  She possibly  might need her gastric emptying study  because of the vomiting episodes  as well.  I am going to keep her on omeprazole, I wrote this for her,  and will have her see Dr. Leone Payor sometime in the next few weeks.     Ulyess Mort, MD  Electronically Signed    SML/MedQ  DD: 03/12/2007  DT: 03/12/2007  Job #: [id number]

## 2011-03-13 NOTE — Procedures (Signed)
CAROTID DUPLEX EXAM   INDICATION:  Follow up carotid artery disease.   HISTORY:  Diabetes:  No.  Cardiac:  Yes, MI.  Hypertension:  No.  Smoking:  Yes.  Previous Surgery:  Left CEA with DPA 04/16/94, redo 11/17/95, redo  12/17/97 by Dr. Arbie Cookey.  CV History:  No.  Amaurosis Fugax No, Paresthesias No, Hemiparesis No.                                       RIGHT               LEFT  Brachial systolic pressure:         138                 140  Brachial Doppler waveforms:         Triphasic           Triphasic  Vertebral direction of flow:        Antegrade           Not visualized  DUPLEX VELOCITIES (cm/sec)  CCA peak systolic                   73                  M=58, D=227/50  ECA peak systolic                   339                 103  ICA peak systolic                   293                 63  ICA end diastolic                   99                  27  PLAQUE MORPHOLOGY:                  Calcific/mixed      Homogenous  PLAQUE AMOUNT:                      Moderate/severe     Moderate/severe  PLAQUE LOCATION:                    ICA/ECA/bifurcation Proximal vein  patch   IMPRESSION:  1. Right internal carotid artery shows evidence of 60-79% stenosis      with an increase in velocities; however, no category change.  2. Left internal carotid artery shows evidence of 20-39% stenosis.  3. Left common carotid artery at proximal end of patch has stable      elevated velocities of 227/50 cm/s, compared to mid common carotid      artery at 58 cm/s.  Homogenous plaque was visualized.  4. Right external carotid artery stenosis.  5. Right vertebral artery appears antegrade.  Left was not visualized.   ___________________________________________  Larina Earthly, M.D.   AS/MEDQ  D:  06/18/2008  T:  06/18/2008  Job:  [id number]

## 2011-03-16 NOTE — Assessment & Plan Note (Signed)
[facility]                         GASTROENTEROLOGY OFFICE NOTE   Linda Kelly, Linda Kelly                          MRN:          [id number]  DATE:02/10/2007                            DOB:          29-Apr-1928    This very nice lady comes in on April 14, is referred by Dr. Alwyn Ren  because of some intermittent nausea and vomiting, loose stools.  She has  had these symptoms for quite some time.  She has been worked up in the  past without any real significant findings to explain these symptoms.  He has recently seen her and is referring her for further assessment to  better try and explain these symptoms.   PAST MEDICAL HISTORY:  Reveals she has a history of colon polyps but her  last colonoscopic examination was in 2007, biopsies for microscopic  colitis and collagenous colitis were negative and she has some moderate  diverticular disease plus decreased anal sphincter.  She also has a  history of arteriosclerotic coronary vascular disease with a history of  an infarction years ago, history of carotid artery endarterectomies,  hypertension, GERD, anxiety/depression.   MEDICATIONS:  Plavix, calcium, multivitamin, Tenormin, Zoloft, Allegra,  Protonix, Zocor, flax seed oil, folic acid, niacin, Zantac, and  omeprazole.   PHYSICAL EXAMINATION:  She weighed 144, blood pressure 110/70, pulse 82  and regular.  NECK, HEART, EXTREMITIES:  Are unremarkable.   RECOMMENDATIONS:  Trial some Xifaxan 200 mg one b.i.d. for 10 days and  Flora-Q.  Get some routine labs on her stools, O&P and culture, fats and  fibers, Hemoccults, and the celiac profile.  See her back in 3 weeks and  then consider maybe Ultrase or trial of Flagyl or small bowel series to  try and explain her symptoms and more specifically GERD.  Certainly this  could be irritable bowel she has had before.  We will see her in return  office visit in approximately 3 weeks as indicated.     Ulyess Mort, MD  Electronically Signed    SML/MedQ  DD: 02/10/2007  DT: 02/10/2007  Job #: [id number]   cc:   Titus Dubin. Alwyn Ren, MD,FACP,FCCP

## 2011-03-16 NOTE — Assessment & Plan Note (Signed)
[facility]                        [facility] OFFICE NOTE   Linda Kelly, Linda Kelly                          MRN:          [id number]  DATE:01/03/2007                            DOB:          1928/01/05    Seen for GI symptoms as well as extrinsic rhinoconjunctivitis symptoms.   She describes vomiting, loose bowel movements mostly 2 times per week  now.  When seen on February 14 for similar symptoms she had been placed  on ranitidine 150 mg twice a day with approximately 50% improvement in  her symptoms.  Additionally, she is trying to eliminate any triggers.  She feels that sweets and chocolate may be a factor.  She has tried to  eliminate anything in the aspirin family, alcohol, peppermint, tobacco,  and caffeine which might contribute to reflux.  Unfortunately she does  continue to smoke a half a pack per day.  This risk has been discussed  on multiple occasions.   She has been using Benadryl with some relief of her allergic symptoms  but has found fexofenadine to be much more effective.   Review of the chart indicates that she was originally seen for these  symptoms in October 2007.  She was having intermittent nausea, vomiting,  diarrhea.  This was in the context of foreign travel.  Her weight at  that time was 145.4.  It has fluctuated minimally and is now 144.6.   Stool for Giardia and Cryptosporidium were negative @ that time.   Liver function test and CBC and diff have been normal, specifically  eosinophil counts have been normal.  She had a colonoscopy in January  2007 which revealed diverticulosis.  Biopsies of the ascending colon  revealed no colitis or granulomas.   Fexofenadine 180 mg daily as needed will be initiated.   EXAMINATION:  She has no organomegaly or masses, bowel sounds are  normal.  SKIN:  Warm and dry.   She will be referred to Dr. Victorino Dike due to the chronicity of  symptoms & failure to respond to the  interventions above & Omeprazole.     Titus Dubin. Alwyn Ren, MD,FACP,FCCP  Electronically Signed    [facility]/MedQ  DD: 01/03/2007  DT: 01/03/2007  Job #: [id number]

## 2011-04-09 ENCOUNTER — Encounter: Payer: Self-pay | Admitting: Internal Medicine

## 2011-04-10 ENCOUNTER — Ambulatory Visit: Payer: Self-pay | Admitting: Internal Medicine

## 2011-05-10 ENCOUNTER — Ambulatory Visit (INDEPENDENT_AMBULATORY_CARE_PROVIDER_SITE_OTHER): Payer: Medicare Other | Admitting: Internal Medicine

## 2011-05-10 ENCOUNTER — Encounter: Payer: Self-pay | Admitting: Internal Medicine

## 2011-05-10 DIAGNOSIS — R5383 Other fatigue: Secondary | ICD-10-CM

## 2011-05-10 DIAGNOSIS — Z862 Personal history of diseases of the blood and blood-forming organs and certain disorders involving the immune mechanism: Secondary | ICD-10-CM

## 2011-05-10 DIAGNOSIS — M2550 Pain in unspecified joint: Secondary | ICD-10-CM

## 2011-05-10 DIAGNOSIS — M8580 Other specified disorders of bone density and structure, unspecified site: Secondary | ICD-10-CM

## 2011-05-10 DIAGNOSIS — Z780 Asymptomatic menopausal state: Secondary | ICD-10-CM

## 2011-05-10 DIAGNOSIS — Z8639 Personal history of other endocrine, nutritional and metabolic disease: Secondary | ICD-10-CM

## 2011-05-10 DIAGNOSIS — I10 Essential (primary) hypertension: Secondary | ICD-10-CM

## 2011-05-10 DIAGNOSIS — M791 Myalgia, unspecified site: Secondary | ICD-10-CM

## 2011-05-10 DIAGNOSIS — R5381 Other malaise: Secondary | ICD-10-CM

## 2011-05-10 DIAGNOSIS — J8410 Pulmonary fibrosis, unspecified: Secondary | ICD-10-CM

## 2011-05-10 DIAGNOSIS — M899 Disorder of bone, unspecified: Secondary | ICD-10-CM

## 2011-05-10 DIAGNOSIS — IMO0001 Reserved for inherently not codable concepts without codable children: Secondary | ICD-10-CM

## 2011-05-10 LAB — HEPATIC FUNCTION PANEL
ALT: 16 U/L (ref 0–35)
Albumin: 4.2 g/dL (ref 3.5–5.2)
Alkaline Phosphatase: 90 U/L (ref 39–117)
Bilirubin, Direct: 0 mg/dL (ref 0.0–0.3)
Total Protein: 7.2 g/dL (ref 6.0–8.3)

## 2011-05-10 LAB — BASIC METABOLIC PANEL
CO2: 31 mEq/L (ref 19–32)
Calcium: 8.6 mg/dL (ref 8.4–10.5)
Glucose, Bld: 84 mg/dL (ref 70–99)
Potassium: 3.8 mEq/L (ref 3.5–5.1)
Sodium: 142 mEq/L (ref 135–145)

## 2011-05-10 LAB — CBC WITH DIFFERENTIAL/PLATELET
Basophils Relative: 0.4 % (ref 0.0–3.0)
Eosinophils Absolute: 0.2 10*3/uL (ref 0.0–0.7)
HCT: 39.8 % (ref 36.0–46.0)
Hemoglobin: 13.7 g/dL (ref 12.0–15.0)
Lymphocytes Relative: 23.2 % (ref 12.0–46.0)
Lymphs Abs: 2.4 10*3/uL (ref 0.7–4.0)
MCHC: 34.4 g/dL (ref 30.0–36.0)
Monocytes Relative: 8 % (ref 3.0–12.0)
Neutro Abs: 6.8 10*3/uL (ref 1.4–7.7)
RBC: 4.12 Mil/uL (ref 3.87–5.11)

## 2011-05-10 NOTE — Patient Instructions (Signed)
Share these laboratory results with all physician seen. Additional recommendations are pending review of these results.

## 2011-05-10 NOTE — Progress Notes (Signed)
Subjective:    Patient ID: Linda Kelly, female    DOB: 10/01/1928, 77 y.o.   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    8# weight  Gain; + night sweats Musculoskeletal: joint stiffness w/o redness  or swelling Skin:no rash, color change Neuro: no weakness;  numbness and tingling. Occasional stool incontinence Heme:no lymphadenopathy; abnormal bruising or bleeding   #2 FATIGUE Onset: worse in past month-6 weeks   Fatigue @ rest : yes    Primarily physical fatigue: yes Symptoms: Night sweats: yes                                                                                            Vision changes ( blurred/ double/ loss): no                                                                                                  Hoarseness or swallowing dysfunction: no                                                                                          Bowel changes( constipation/ diarrhea): loose stool occasionally                                                                                     : Exertional chest pain: no  Dyspnea on exertion: yes, Pulmonary Fibrosis followed by Dr Maple [name]  Cough: no  Hemoptysis: no  New medications: no  Leg swelling: no  PND: no  Melena/ rectal bleeding: no  Severe snoring: no  Daytime sleepiness: yes  Skin / hair / nail changes: no  Temperature intolerance( heat/ cold) : no  Feeling depressed: no  Anhedonia: no  Altered appetite: no   Apnea : no     Review of Systems she is having extensive dental work due to loss of teeth. She  believes her bone density has been normal in the past.     Objective:   Physical Exam Gen.: Healthy and well-nourished in appearance. Alert, appropriate and cooperative throughout exam. Appears younger than age Head: Normocephalic without obvious abnormalities  Eyes: No corneal or conjunctival inflammation noted.No icterus Mouth: Oral mucosa and oropharynx reveal no lesions or exudates. Implant lower; partial upper Neck: No deformities, masses, or tenderness noted.  Thyroid normal. Lungs: Normal respiratory effort; chest expands symmetrically. Lungs :diffuse dry rales w/o wheezes, or increased work of breathing. Heart: Normal rate and rhythm. Normal S1 and S2. No gallop, click, or rub. S4 with slurring; no  murmur. Abdomen: Bowel sounds normal; abdomen soft and nontender. No masses, organomegaly or hernias noted.                                                                                  Musculoskeletal/extremities: No deformity or scoliosis noted of  the thoracic or lumbar spine. No clubbing (false nails), cyanosis, edema  noted. Joints: DIP OA changes.  Nail health  good. Vascular:  Decreased  dorsalis pedis and  posterior tibial pulses  Neurologic: Alert and oriented x3. Deep tendon reflexes symmetrical  But 1/2 + @ knees.          Skin: Intact without suspicious lesions or rashes. Lymph: No cervical, axillary lymphadenopathy present. Psych: Mood and affect slightly flat; no animation in voice, almost monotone. Multiple complaints described almost with "la belle indifference"                                                                                     Assessment & Plan:  #1 muscle cramping  #2 arthralgias  #3 fatigue  #3 see pertinent active  diagnoses in Problem List  Plan: See orders

## 2011-05-17 ENCOUNTER — Telehealth: Payer: Self-pay | Admitting: Internal Medicine

## 2011-05-17 NOTE — Telephone Encounter (Signed)
Pt called says that she received copy of labs and would like to know what she is suppose to do about BM issues and muscle aches since this wasn't mentioned in labs.

## 2011-05-17 NOTE — Telephone Encounter (Signed)
The muscle studies were all normal. Vitamin D level is actually too high. She should not take> 1000 international units of vitamin D 3 daily. She should try ALIGN daily for the BM issues. If the incontinence continues she should be seen by gastroenterology.

## 2011-05-17 NOTE — Telephone Encounter (Signed)
Pt notified and states that she does not take vitamin d. Pt will call Dr. Leone Payor for appt.

## 2011-05-21 ENCOUNTER — Telehealth: Payer: Self-pay | Admitting: Internal Medicine

## 2011-05-21 NOTE — Telephone Encounter (Signed)
Patient will come in tomorrow and see Dr Leone Payor at [facility]

## 2011-05-22 ENCOUNTER — Encounter: Payer: Self-pay | Admitting: Internal Medicine

## 2011-05-22 ENCOUNTER — Ambulatory Visit (INDEPENDENT_AMBULATORY_CARE_PROVIDER_SITE_OTHER): Payer: Medicare Other | Admitting: Internal Medicine

## 2011-05-22 VITALS — BP 120/76 | HR 76 | Ht 58.5 in | Wt 149.6 lb

## 2011-05-22 DIAGNOSIS — K589 Irritable bowel syndrome without diarrhea: Secondary | ICD-10-CM

## 2011-05-22 DIAGNOSIS — Z86010 Personal history of colonic polyps: Secondary | ICD-10-CM

## 2011-05-22 DIAGNOSIS — R198 Other specified symptoms and signs involving the digestive system and abdomen: Secondary | ICD-10-CM | POA: Insufficient documentation

## 2011-05-22 DIAGNOSIS — R159 Full incontinence of feces: Secondary | ICD-10-CM

## 2011-05-22 MED ORDER — PEG-KCL-NACL-NASULF-NA ASC-C 100 G PO SOLR: 1.0000 | Freq: Once | ORAL | Status: DC

## 2011-05-22 NOTE — Progress Notes (Signed)
  Subjective:    Patient ID: Linda Kelly, female    DOB: December 20, 1927, 77 y.o.   MRN: [id number]  HPI 77 yo ww last seen in 2010 for hemorrhoids and IBS. She has had a few weeks of going to the bathroom "all day long". Multiple stools, small to large, some quick but others unable to finish despite 15 minutes on the toilet. She may think she has completed defecation and she will then have a sudden bowel movement and incontinence. She sees mucous production. She will see slight red blood on the paper with "a long siege". Never in commode or in stool. Stools are variable consistency. No medication or diet changes, no travel. Son-in-law had MI and CABG and then stroke and pneumonia and she was caring for grandson with Down's syndrome (age 73).    Review of Systems As above    Objective:   Physical Exam Well-developed overweight elderly white woman no acute distress Abdomen is soft mildly obese nontender no organomegaly or mass Rectal exam with female staff present shows some skin tags otherwise normal anoderm. The resting tone is slightly reduced. She is able to have a decent voluntary squeeze though there is some difficulty holding it. There is no rectocele nor is there any abnormal descent with Valsalva. It is appropriate. There is no stool in the vault, no impaction no mass of the rectal exam is somewhat tender. She appears mildly anxious but overall normal mood and affect.       Assessment & Plan:

## 2011-05-22 NOTE — Patient Instructions (Addendum)
I will see you at your colonoscopy. Please look acute multivitamin and see how much vitamin D had since her vitamin D level is elevated mildly. I think you probably need to stop taking vitamin D. at this time. The overall significance of that elevation is unclear.  You have been scheduled for a Colonoscopy with separate instructions given. Your prep kit has been sent to your pharmacy for you to pick up.

## 2011-05-22 NOTE — Assessment & Plan Note (Signed)
She is having problems with tenesmus, mucus production and frequent defecation with incontinence. While this is most likely her IBS is a significant change, the rectal exam is unrevealing at this point and given her overall history I think a colonoscopy is prudent. She understands the risks benefits and indications and agrees to proceed.

## 2011-05-22 NOTE — Assessment & Plan Note (Signed)
Long-standing diagnosis. She seemed somewhat unaware that this is a problem. This is the most likely cause of her problems but as it is a diagnosis of exclusion and she said these changes I think colonoscopy is appropriate.

## 2011-05-24 ENCOUNTER — Encounter: Payer: Self-pay | Admitting: Internal Medicine

## 2011-05-24 ENCOUNTER — Ambulatory Visit (AMBULATORY_SURGERY_CENTER): Payer: Medicare Other | Admitting: Internal Medicine

## 2011-05-24 DIAGNOSIS — R198 Other specified symptoms and signs involving the digestive system and abdomen: Secondary | ICD-10-CM

## 2011-05-24 DIAGNOSIS — K589 Irritable bowel syndrome without diarrhea: Secondary | ICD-10-CM

## 2011-05-24 DIAGNOSIS — R159 Full incontinence of feces: Secondary | ICD-10-CM

## 2011-05-24 DIAGNOSIS — K5730 Diverticulosis of large intestine without perforation or abscess without bleeding: Secondary | ICD-10-CM

## 2011-05-24 DIAGNOSIS — K649 Unspecified hemorrhoids: Secondary | ICD-10-CM

## 2011-05-24 DIAGNOSIS — Z86010 Personal history of colonic polyps: Secondary | ICD-10-CM

## 2011-05-24 DIAGNOSIS — K625 Hemorrhage of anus and rectum: Secondary | ICD-10-CM

## 2011-05-24 DIAGNOSIS — K648 Other hemorrhoids: Secondary | ICD-10-CM

## 2011-05-24 MED ORDER — ALIGN 4 MG PO CAPS: 1.0000 | ORAL_CAPSULE | Freq: Every day | ORAL | Status: DC

## 2011-05-24 MED ORDER — SODIUM CHLORIDE 0.9 % IV SOLN: 500.0000 mL | INTRAVENOUS | Status: DC

## 2011-05-24 MED ORDER — BENEFIBER DRINK MIX PO PACK: 1.0000 | PACK | Freq: Every day | ORAL | Status: DC

## 2011-05-24 NOTE — Patient Instructions (Addendum)
I think your IBS or irritable Bowel Syndrome is acting up. It may be from some stress if you have that or sometimes we do not know the trigger. You should  do better if you have more regular bowel movements. Try 1 tablespoon of Benefiber daily and take a probiotic called Align (gives good bacteria to your intestines) These are on your med list now. Also minimize raw vegetables and caffeine. If this fails to control things after 1-2 months, let me know.  RESUME ALL MEDICATIONS. FOLLOW INSTRUCTIONS THAT DR. Leone Payor WROTE FOR IBS. HANDOUT GIVEN FOR IBS.

## 2011-05-25 ENCOUNTER — Telehealth: Payer: Self-pay

## 2011-05-25 NOTE — Telephone Encounter (Signed)
Follow up Call- Patient questions:  Do you have a fever, pain , or abdominal swelling? no Pain Score  0 *  Have you tolerated food without any problems? yes  Have you been able to return to your normal activities? no  Do you have any questions about your discharge instructions: Diet   no Medications  no Follow up visit  no  Do you have questions or concerns about your Care? no  Actions: * If pain score is 4 or above: No action needed, pain <4.  "I'm okay this am". maw

## 2011-06-07 ENCOUNTER — Encounter: Payer: Self-pay | Admitting: Internal Medicine

## 2011-06-07 ENCOUNTER — Ambulatory Visit (INDEPENDENT_AMBULATORY_CARE_PROVIDER_SITE_OTHER): Payer: Medicare Other | Admitting: Internal Medicine

## 2011-06-07 DIAGNOSIS — J449 Chronic obstructive pulmonary disease, unspecified: Secondary | ICD-10-CM

## 2011-06-07 DIAGNOSIS — J84111 Idiopathic interstitial pneumonia, not otherwise specified: Secondary | ICD-10-CM

## 2011-06-07 NOTE — Assessment & Plan Note (Addendum)
We again contrasted CT with CXR information vs radiation/ cost. I think an annual CXR is reasonable for now. I'm not sure we can distinguish fibrotic lung changes secondary to her twin [place] exposure from DIP of cigarette exposure.  exam reveals crackles would be expected with interstitial lung disease. There is little evidence of any progressive process.   Plan -will update PFT

## 2011-06-07 NOTE — Progress Notes (Signed)
Subjective:    Patient ID: Linda Kelly, female    DOB: 11-Oct-1928, 77 y.o.   MRN: [id number]  HPI 06/07/11- 74 yoF 1/2 PPD smoker followed for pulmonary fibrosis/ ILD, COPD complicated by CAD,PAD hx lung nodule, hx [employer] exposure with long term f/u here.  Still says she is trying to cut back on her smoking "trying". Been having GI complaints- Dr Leone Payor colonoscopy. She admits tiredness overlapping with shortness of breath. She will nap and put her feet up. Not much cough- scant phlegm, no blood or purulent. Doing water aerobics 3 days/ week. Last here October 10, 2010 CXR 10/10/10- unchanged basilar fibrotic scarring- reviewed.   Review of Systems Constitutional:   No-   weight loss, night sweats, fevers, chills, fatigue, lassitude. HEENT:   No-  headaches, difficulty swallowing, tooth/dental problems, sore throat,       No-  sneezing, itching, ear ache, nasal congestion, post nasal drip,  CV:  No-   chest pain, orthopnea, PND, swelling in lower extremities, anasarca, dizziness, palpitations Resp: Little  shortness of breath with exertion or at rest.              No-   productive cough,  No non-productive cough,  No-  coughing up of blood.              No-   change in color of mucus.  No- wheezing.   Skin: No-   rash or lesions. GI:  No-   heartburn, indigestion, abdominal pain, nausea, vomiting, diarrhea,                 change in bowel habits, loss of appetite GU: No-   dysuria, change in color of urine, no urgency or frequency.  No- flank pain. MS:  No-   joint pain or swelling.  No- decreased range of motion.  No- back pain. Neuro- grossly normal to observation, Or:  Psych:  No- change in mood or affect. No depression or anxiety.  No memory loss.   Objective:   Physical Exam General- Alert, Oriented, Affect-appropriate, Distress- none acute Skin- rash-none, lesions- none, excoriation- none Lymphadenopathy- none Head- atraumatic            Eyes- Gross vision  intact, PERRLA, conjunctivae clear secretions            Ears- Hearing, canals normal            Nose- Clear, No-Septal dev, mucus, polyps, erosion, perforation             Throat- Mallampati II , mucosa clear , drainage- none, tonsils- atrophic;  dentures Neck- flexible , trachea midline, no stridor , thyroid nl, carotid no bruit Chest - symmetrical excursion , unlabored           Heart/CV- RRR , no murmur , no gallop  , no rub, nl s1 s2                           - JVD- none , edema- none, stasis changes- none, varices- none           Lung- + bilateral crackles to lower 1/3 of back, wheeze- none, cough- none , dullness-none, rub- none           Chest wall-  Abd- tender-no, distended-no, bowel sounds-present, HSM- no Br/ Gen/ Rectal- Not done, not indicated Extrem- cyanosis- none, clubbing, none, atrophy- none, strength- nl Neuro- grossly intact to observation  Assessment & Plan:

## 2011-06-07 NOTE — Patient Instructions (Signed)
Order- schedule PFT

## 2011-06-10 NOTE — Assessment & Plan Note (Signed)
She is not doing badly for an 77 year old smoker. There is little reactive airways disease and she remains physically active. We have again addressed smoking cessation.

## 2011-06-14 ENCOUNTER — Ambulatory Visit (INDEPENDENT_AMBULATORY_CARE_PROVIDER_SITE_OTHER): Payer: Medicare Other | Admitting: Internal Medicine

## 2011-06-14 DIAGNOSIS — J84111 Idiopathic interstitial pneumonia, not otherwise specified: Secondary | ICD-10-CM

## 2011-06-14 LAB — PULMONARY FUNCTION TEST

## 2011-06-14 NOTE — Progress Notes (Signed)
PFT done today. 

## 2011-08-19 ENCOUNTER — Other Ambulatory Visit: Payer: Self-pay | Admitting: Internal Medicine

## 2011-09-06 ENCOUNTER — Ambulatory Visit (INDEPENDENT_AMBULATORY_CARE_PROVIDER_SITE_OTHER): Payer: Medicare Other | Admitting: Internal Medicine

## 2011-09-06 ENCOUNTER — Encounter: Payer: Self-pay | Admitting: Internal Medicine

## 2011-09-06 VITALS — BP 122/80 | HR 95 | Temp 97.5°F | Wt 148.6 lb

## 2011-09-06 DIAGNOSIS — K589 Irritable bowel syndrome without diarrhea: Secondary | ICD-10-CM

## 2011-09-06 DIAGNOSIS — K5790 Diverticulosis of intestine, part unspecified, without perforation or abscess without bleeding: Secondary | ICD-10-CM

## 2011-09-06 DIAGNOSIS — R1032 Left lower quadrant pain: Secondary | ICD-10-CM

## 2011-09-06 DIAGNOSIS — K5730 Diverticulosis of large intestine without perforation or abscess without bleeding: Secondary | ICD-10-CM

## 2011-09-06 LAB — POCT URINALYSIS DIPSTICK
Bilirubin, UA: NEGATIVE
Glucose, UA: NEGATIVE
Ketones, UA: NEGATIVE
Spec Grav, UA: 1.025
Urobilinogen, UA: 0.2

## 2011-09-06 MED ORDER — ACETAMINOPHEN-CODEINE 300-30 MG PO TABS: 1.0000 | ORAL_TABLET | Freq: Four times a day (QID) | ORAL | Status: DC | PRN

## 2011-09-06 NOTE — Patient Instructions (Signed)
Please complete stool cards

## 2011-09-06 NOTE — Progress Notes (Signed)
  Subjective:    Patient ID: Linda Kelly, female    DOB: 11-17-1927, 77 y.o.   MRN: [id number]  HPI ABDOMINAL PAIN: Location: LLQ  Onset: 09/04/2011   Radiation: no, but it seems to radiate to the left flank at times Severity: up to 9 Quality: sharp  Duration: intermittent , can last hours  Better with: no factors Worse with: no factors Symptoms Nausea/Vomiting: no  Diarrhea: no  Constipation: no  Melena/BRBPR: no  Hematemesis: no  Anorexia: no  Fever/Chills: no  Dysuria/ hematuria/ pyuria: no  Rash: no  Wt loss: no   Past medical history includes irritable bowel syndrome, chronic gastritis, hiatal hernia, diverticulosis    Past Surgeries: no appendectomy       Review of Systems she fail 11/3 injured her hand and left leg. She did not hit her abdomen or back.    Objective:   Physical Exam General appearance is one of good health and nourishment w/o distress.  Eyes: No conjunctival inflammation or scleral icterus is present.  Oral exam: Dentures; lips and gums are healthy appearing.There is no oropharyngeal erythema or exudate noted.   Heart:  Normal rate and regular rhythm. S1 and S2 normal without gallop,  click, rub or other extra sounds  . Grade 1 systolic murmur   Lungs:Chest clear to auscultation; no wheezes, rhonchi or rubs present.No increased work of breathing. She does have very faint rales at the bases.  Abdomen: bowel sounds normal, soft and initially  non-tender without masses, organomegaly or hernias noted.  No guarding or rebound . Straight leg raising to 90 aggravated discomfort in the left lower quadrant as did range of motion of the left hip. After these range of motion maneuvers, the left lower quadrant was tender to palpation  Skin:Warm & dry.  Intact without suspicious lesions or rashes ; no jaundice . She does have 2 areas of ecchymosis in the left medial thigh.  Lymphatic: No lymphadenopathy is noted about the head, neck, axilla  areas.               Assessment & Plan:  #1 left lower quadrant abdominal pain and tenderness. This appears to have no relationship to the fall 11/3. Range of motion of the left lower extremity and straight leg raising leg raising on the right does aggravate the symptoms. This would suggest abdominal wall muscle injury but the symptoms did not appear for 3 days after the fall.  #2 past medical history of diverticulosis; this would be an  atypical presentation.  Plan: See orders and recommendations

## 2011-09-07 LAB — CBC WITH DIFFERENTIAL/PLATELET
Basophils Relative: 0.3 % (ref 0.0–3.0)
Eosinophils Relative: 2.1 % (ref 0.0–5.0)
Lymphocytes Relative: 44.4 % (ref 12.0–46.0)
Monocytes Relative: 12.6 % — ABNORMAL HIGH (ref 3.0–12.0)
Neutrophils Relative %: 40.6 % — ABNORMAL LOW (ref 43.0–77.0)
RBC: 4.06 Mil/uL (ref 3.87–5.11)
WBC: 8.8 10*3/uL (ref 4.5–10.5)

## 2011-09-10 ENCOUNTER — Telehealth: Payer: Self-pay | Admitting: Internal Medicine

## 2011-09-10 NOTE — Telephone Encounter (Signed)
Discuss with patient, Pt notes that she already has GI and will contact them.

## 2011-09-10 NOTE — Telephone Encounter (Signed)
I reviewed the office note ; I recommend GI referral as the labs were negative and Tylenol No. 3 is not helping. PMH of diverticulosis

## 2011-09-19 ENCOUNTER — Other Ambulatory Visit (INDEPENDENT_AMBULATORY_CARE_PROVIDER_SITE_OTHER): Payer: Medicare Other

## 2011-09-19 DIAGNOSIS — Z1211 Encounter for screening for malignant neoplasm of colon: Secondary | ICD-10-CM

## 2011-09-19 LAB — HEMOCCULT GUIAC POC 1CARD (OFFICE)
Card #3 Fecal Occult Blood, POC: NEGATIVE
Fecal Occult Blood, POC: NEGATIVE

## 2011-09-19 NOTE — Progress Notes (Signed)
12  

## 2011-10-09 ENCOUNTER — Other Ambulatory Visit: Payer: Self-pay | Admitting: Internal Medicine

## 2011-10-10 ENCOUNTER — Other Ambulatory Visit: Payer: Self-pay | Admitting: Cardiology

## 2011-10-10 DIAGNOSIS — I701 Atherosclerosis of renal artery: Secondary | ICD-10-CM

## 2011-10-10 MED ORDER — CLOPIDOGREL BISULFATE 75 MG PO TABS: 75.0000 mg | ORAL_TABLET | Freq: Every day | ORAL | Status: DC

## 2011-10-10 NOTE — Telephone Encounter (Signed)
RX faxed to [phone]

## 2011-10-11 ENCOUNTER — Encounter (INDEPENDENT_AMBULATORY_CARE_PROVIDER_SITE_OTHER): Payer: Medicare Other | Admitting: Cardiology

## 2011-10-11 DIAGNOSIS — I701 Atherosclerosis of renal artery: Secondary | ICD-10-CM

## 2011-11-06 ENCOUNTER — Other Ambulatory Visit: Payer: Self-pay | Admitting: Occupational Medicine

## 2011-11-06 ENCOUNTER — Ambulatory Visit: Payer: Self-pay

## 2011-11-06 DIAGNOSIS — Z0000 Encounter for general adult medical examination without abnormal findings: Secondary | ICD-10-CM

## 2011-12-11 ENCOUNTER — Ambulatory Visit (INDEPENDENT_AMBULATORY_CARE_PROVIDER_SITE_OTHER): Payer: Medicare Other | Admitting: Internal Medicine

## 2011-12-11 ENCOUNTER — Encounter: Payer: Self-pay | Admitting: Internal Medicine

## 2011-12-11 DIAGNOSIS — J84111 Idiopathic interstitial pneumonia, not otherwise specified: Secondary | ICD-10-CM

## 2011-12-11 DIAGNOSIS — F17200 Nicotine dependence, unspecified, uncomplicated: Secondary | ICD-10-CM
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
## 2011-12-11 NOTE — Patient Instructions (Signed)
Please talk with Dr Alwyn Ren about your unsteadiness/ vertigo  I still encourage you to quit smoking- it can't be good for your lungs or your heart

## 2011-12-14 ENCOUNTER — Other Ambulatory Visit: Payer: Self-pay | Admitting: Internal Medicine

## 2011-12-14 DIAGNOSIS — Z1231 Encounter for screening mammogram for malignant neoplasm of breast: Secondary | ICD-10-CM

## 2011-12-15 NOTE — Assessment & Plan Note (Signed)
Counseling efforts again today. She is not motivated to quit.
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
## 2012-01-15 ENCOUNTER — Ambulatory Visit (HOSPITAL_COMMUNITY)
Admission: RE | Admit: 2012-01-15 | Discharge: 2012-01-15 | Disposition: A | Discharge status: Home / Self Care | Payer: Medicare Other | Source: Ambulatory Visit | Attending: Internal Medicine | Admitting: Internal Medicine

## 2012-01-15 DIAGNOSIS — Z1231 Encounter for screening mammogram for malignant neoplasm of breast: Secondary | ICD-10-CM

## 2012-03-03 ENCOUNTER — Telehealth: Payer: Self-pay | Admitting: Internal Medicine

## 2012-03-03 NOTE — Telephone Encounter (Signed)
Refill: Omeprazole dr 20mg  capsule. Take one capsule by mouth 30 minutes before eating. Qty 90. Last fill 11-05-11

## 2012-03-03 NOTE — Telephone Encounter (Signed)
See Drug interaction report (printed and placed on ledge for review)  and please advise on refill request

## 2012-03-03 NOTE — Telephone Encounter (Signed)
Because of Plavix therapy; the PPIs are contraindicated. Alternative would be ranitidine 150 mg twice a day before breakfast and evening meal. This can be stat dispensed  @ 180 pills with one refill

## 2012-03-04 MED ORDER — RANITIDINE HCL 150 MG PO TABS: 150.0000 mg | ORAL_TABLET | Freq: Two times a day (BID) | ORAL | Status: DC

## 2012-03-04 NOTE — Telephone Encounter (Signed)
Patient was informed of change in therapy and reasoning, patient verbalized understanding and did not have questions at the time of call

## 2012-03-14 ENCOUNTER — Emergency Department (HOSPITAL_COMMUNITY)
Admission: EM | Admit: 2012-03-14 | Discharge: 2012-03-14 | Disposition: A | Discharge status: Home / Self Care | Payer: Medicare Other | Attending: Emergency Medicine | Admitting: Emergency Medicine

## 2012-03-14 ENCOUNTER — Telehealth: Payer: Self-pay | Admitting: *Deleted

## 2012-03-14 ENCOUNTER — Encounter (HOSPITAL_COMMUNITY): Payer: Self-pay

## 2012-03-14 ENCOUNTER — Emergency Department (HOSPITAL_COMMUNITY): Payer: Medicare Other

## 2012-03-14 DIAGNOSIS — R079 Chest pain, unspecified: Secondary | ICD-10-CM | POA: Insufficient documentation

## 2012-03-14 DIAGNOSIS — R51 Headache: Secondary | ICD-10-CM | POA: Insufficient documentation

## 2012-03-14 DIAGNOSIS — M47812 Spondylosis without myelopathy or radiculopathy, cervical region: Secondary | ICD-10-CM | POA: Insufficient documentation

## 2012-03-14 DIAGNOSIS — I252 Old myocardial infarction: Secondary | ICD-10-CM | POA: Insufficient documentation

## 2012-03-14 DIAGNOSIS — M542 Cervicalgia: Secondary | ICD-10-CM

## 2012-03-14 DIAGNOSIS — I2510 Atherosclerotic heart disease of native coronary artery without angina pectoris: Secondary | ICD-10-CM | POA: Insufficient documentation

## 2012-03-14 MED ORDER — HYDROCODONE-ACETAMINOPHEN 5-325 MG PO TABS: 1.0000 | ORAL_TABLET | ORAL | Status: AC | PRN

## 2012-03-14 MED ORDER — HYDROCODONE-ACETAMINOPHEN 5-325 MG PO TABS
1.0000 | ORAL_TABLET | Freq: Once | ORAL | Status: AC
  Administered 2012-03-14: 1 via ORAL
  Filled 2012-03-14: qty 1

## 2012-03-14 MED ORDER — PREDNISONE 20 MG PO TABS: 20.0000 mg | ORAL_TABLET | Freq: Two times a day (BID) | ORAL | Status: AC

## 2012-03-14 NOTE — Discharge Instructions (Signed)
Use heat on the sore area 3-4 times a day. Rest as much as possible.   Headache and Arthritis Headaches and arthritis are common problems. This causes an interest in the possible role of arthritis in causing headaches. Several major forms of arthritis exist. Two of the most common types are:  Rheumatoid arthritis.   Osteoarthritis.  Rheumatoid arthritis may begin at any age. It is a condition in which the body attacks some of its own tissues, thinking they do not belong. This leads to destruction of the bony areas around the joints. This condition may afflict any of the body's joints. It usually produces a deformity of the joint. The hands and fingers no longer appear straight but often appear angled towards one side. In some cases, the spine may be involved. Most often it is the vertebrae of the neck (cervicalspine). The areas of the neck most commonly afflicted by rheumatoid arthritis are the first and second cervical vertebrae. Curiously, rheumatoid arthritis, though it often produces severe deformities, is not always painful.  The more common form of arthritis is osteoarthritis. It is a wear-and-tear form of arthritis. It usually does not produce deformity of the joints or destruction of the bony tissues. Rather the ligaments weaken. They may be calcified due to the body's attempt to heal the damage. The larger joints of the body and those joints that take the most stress and strain are the most often affected. In the neck region this osteoarthritis usually involves the fifth, sixth and seventh vertebrae. This is because the effects of posture produce the most fatigue on them. Osteoarthritis is often more painful than rheumatoid arthritis.  During workups for arthritis, a test evaluating inflammation, (the sedimentation rate) often is performed. In rheumatoid arthritis, this test will usually be elevated. Other tests for inflammation may also be elevated. In patients with osteoarthritis, x-rays of  the neck or jaw joints will show changes from "lipping" of the vertebrae. This is caused by calcium deposits in the ligaments. Or they may show narrowing of the space between the vertebrae, or spur formation (from calcium deposits). If severe, it may cause obstruction of the holes where the nerves pass from the spine to the body. In rheumatoid arthritis, dislocation of vertebrae may occur in the upper neck. CT scan and MRI in patients with osteoarthritis may show bulging of the discs that cushion the vertebrae. In the most severe cases, herniation of the discs may occur.  Headaches, felt as a pain in the neck, may be caused by arthritis if the first, second or third vertebrae are involved. This condition is due to the nerves that supply the scalp only originating from this area of the spine. Neck pain itself, whether alone or coupled with headaches, can involve any portion of the neck. If the jaw is involved, the symptoms are similar to those of Temporomandibular Joint Syndrome (TMJ).  The progressive severity of rheumatoid arthritis may be slowed by a variety of potent medications. In osteoarthritis, its progression is not usually hindered by medication. The following may be helpful in slowing the advancement of the disorder:  Lifestyle adjustment.   Exercise.   Rest.   Weight loss.  Medications, such as the nonsteroidal anti-inflammatory agents (NSAIDs), are useful. They may reduce the pain and improve the reduced motion which occurs in joints afflicted by arthritis. From some studies, the use of acetaminophen appears to be as effective in controlling the pain of arthritis as the NSAIDs. Physical modalities may also be useful for  arthritis. They include:  Heat.   Massage.   Exercise.  But physical therapy must be prescribed by a caregiver, just as most medications for arthritis.  Document Released: 01/05/2004 Document Revised: 10/04/2011 Document Reviewed: 06/03/2008 [facility] Patient  Information 2012 [place], [place].

## 2012-03-14 NOTE — Telephone Encounter (Signed)
Agree w/ ER evaluation

## 2012-03-14 NOTE — ED Provider Notes (Signed)
History  This chart was scribed for Flint Melter, MD by Bennett Scrape. This patient was seen in room STRE1/STRE1 and the patient's care was started at 2:44PM.  CSN: [id number]  Arrival date & time 03/14/12  1353   First MD Initiated Contact with Patient 03/14/12 1444      Chief Complaint  Patient presents with  . Chest Pain  . Torticollis    The history is provided by the patient. No language interpreter was used.    Linda Kelly is a 78 y.o. female who presents to the Emergency Department complaining of 6 days of occipitally located HA that radiates to the back of her neck with associated ear pain, chest tightness and productive cough of clear sputum. The neck pain is worse with turning the head. She denies changes in hearing or vision. She reports trying Advil and tylenol with no improvement. She states that she has prior episodes of similar symptoms about 5 or 6 years ago. She states that she had a x-ray done but was not told the results. She states that she was treated with muscle relaxers and discharge. Pt also reports that she has an MRI of her head in 2008 but is also unsure of those results as well. She also reports dizziness described as feeling off-balance with walking for the past month but daughter states that this has been normal for the pt "for a while". She denies lightheadedness, numbness, fever, nausea, and emesis as associated symptoms. She has a h/p PVD, CAD, IBS, COPD, GERD and MI. She is a current everyday smoker but denies alcohol use.  PCP is Dr. Alwyn Ren.  Past Medical History  Diagnosis Date  . PVD (peripheral vascular disease)   . Hyperlipidemia   . CAD (coronary artery disease)   . Tinnitus     chronic  . Internal hemorrhoid   . IBS (irritable bowel syndrome)   . High frequency hearing loss   . Chronic gastritis   . Benign neoplasm of esophagus   . Hiatal hernia   . Colon, diverticulosis   . Carotid artery disease   . Other nonthrombocytopenic  purpuras   . Solitary pulmonary nodule   . Idiopathic pulmonary fibrosis   . COPD (chronic obstructive pulmonary disease)   . Vertigo   . GERD (gastroesophageal reflux disease)   . Anxiety and depression   . MI (myocardial infarction) 1985  . Arthritis     Past Surgical History  Procedure Date  . Foot surgery     bilateral  . Toe surgery     right foot  . Carotid endarterectomy     left side x 3, saphenous vein graft from left leg  . Tonsillectomy   . Shoulder surgery     left  . Dental surgery     2012   . Angioplasty 1997, 98, 99  . Upper gastrointestinal endoscopy 11-27-01, 08-18-08    Hiatal hernia, benign esophagus neoplasia, gastritis, tortuous esophagus 54 Maloney dilation performed  . Colonoscopy 11-27-01, 10-30-05, 05-24-11    diverticulosis, hemorrhoids    Family History  Problem Relation Age of Onset  . Heart attack Father     deceased at 16, MGF  . Heart disease Mother     deaceased at age 69  . Colon cancer Father   . Colitis Mother   . Colitis Sister   . Other Brother     deceased age 69; war  . Breast cancer Maternal Aunt     great   .  Prostate cancer Father   . Lung cancer Paternal Aunt     great  . Stroke Mother   . Vasculitis Son     History  Substance Use Topics  . Smoking status: Current Everyday Smoker -- 0.5 packs/day  . Smokeless tobacco: Never Used  . Alcohol Use: No     Review of Systems  A complete 10 system review of systems was obtained and all systems are negative except as noted in the HPI and PMH.   Allergies  Review of patient's allergies indicates no known allergies.  Home Medications   Current Outpatient Rx  Name Route Sig Dispense Refill  . ASPIRIN EC 81 MG PO TBEC Oral Take 81 mg by mouth daily.    Marland Kitchen CLOPIDOGREL BISULFATE 75 MG PO TABS Oral Take 75 mg by mouth daily.    Marland Kitchen FEXOFENADINE HCL 180 MG PO TABS Oral Take 180 mg by mouth daily as needed. For allergies.    Marland Kitchen FOLIC ACID 400 MCG PO TABS Oral Take 400 mcg by  mouth daily.      Marland Kitchen FISH OIL 300 MG PO CAPS Oral Take 1 capsule by mouth daily.      Marland Kitchen RANITIDINE HCL 150 MG PO TABS Oral Take 150 mg by mouth 2 (two) times daily.    . SERTRALINE HCL 50 MG PO TABS Oral Take 50 mg by mouth daily.    Marland Kitchen SIMVASTATIN 40 MG PO TABS Oral Take 40 mg by mouth daily.      Marland Kitchen HYDROCODONE-ACETAMINOPHEN 5-325 MG PO TABS Oral Take 1 tablet by mouth every 4 (four) hours as needed for pain. 20 tablet 0  . PREDNISONE 20 MG PO TABS Oral Take 1 tablet (20 mg total) by mouth 2 (two) times daily. 10 tablet 0    Triage Vitals: BP 119/77  Pulse 105  Temp(Src) 98.1 F (36.7 C) (Oral)  Resp 16  SpO2 97%  Physical Exam  Nursing note and vitals reviewed. Constitutional: She is oriented to person, place, and time. She appears well-developed and well-nourished.  HENT:  Head: Normocephalic and atraumatic.  Eyes: Conjunctivae and EOM are normal. Pupils are equal, round, and reactive to light.  Neck: Phonation normal.       Decreased lateral bend due to pain, normal extension and flexion   Cardiovascular: Normal rate, regular rhythm and intact distal pulses.   Pulmonary/Chest: Effort normal and breath sounds normal. She exhibits no tenderness.  Abdominal: Soft. She exhibits no distension. There is no tenderness. There is no guarding.  Musculoskeletal: Normal range of motion.  Neurological: She is alert and oriented to person, place, and time. She has normal strength.  Skin: Skin is warm and dry.  Psychiatric: She has a normal mood and affect. Her behavior is normal. Judgment and thought content normal.    ED Course  Procedures (including critical care time)  DIAGNOSTIC STUDIES: Oxygen Saturation is 97% on room air, adequate by my interpretation.     Date: 03/14/2012  Rate: 107  Rhythm: sinus tachycardia  QRS Axis: normal  Intervals: normal  ST/T Wave abnormalities: normal  Conduction Disutrbances:none  Narrative Interpretation:   Old EKG Reviewed:  unchanged   COORDINATION OF CARE: 3:16PM-Advised pt that I will look at her past MRI and pt agreed.  3:27PM-Discussed x-rays and hydrocodone as treatment plan with pt and pt agreed. 5:09PM-Informed pt of neck x-ray showing DJD. Advised pt to use a heating pad to improve symptoms. Will discharge with prednisone and pain medications. Also advised  pt to follow up with PCP next week.  Labs Reviewed - No data to display Dg Cervical Spine Complete  03/14/2012  *RADIOLOGY REPORT*  Clinical Data: Posterior neck pain.  CERVICAL SPINE - COMPLETE 4+ VIEW  Comparison: None.  Findings: Degenerative disc disease and facet disease throughout the cervical spine.  Prevertebral soft tissues are normal.  No fracture.  Slight retrolisthesis of C5 on C6 related to facet disease.  IMPRESSION: No acute bony abnormality.  Spondylosis.  Original Report Authenticated By: Cyndie Chime, M.D.     1. Neck pain   2. Degenerative joint disease of cervical spine       MDM  Headache and neck pain, likely due to  degenerative changes in cervical spine. Doubt acute spinal stenosis. Doubt CVA, space occupying lesion in the brain, meningitis, or metabolic instability.  Doubt ACS, serious bacterial infection, or pneumonia.    I personally performed the services described in this documentation, which was scribed in my presence. The recorded information has been reviewed and considered.     Plan: Home Medications- Norco, Prednisone; Home Treatments- Heat; Recommended follow up- PCP 1 week   Flint Melter, MD 03/14/12 1721

## 2012-03-14 NOTE — Telephone Encounter (Signed)
Per CAN Pt was advise to dial 911 due cardiac symptoms. Pt refused and states that she would have someone drive her. Per CAN PCP must be notified when Pt refuse advice. Awaiting CAN detail documentation of call to enter system.

## 2012-03-14 NOTE — ED Notes (Signed)
Pt complains of pain in neck and chest and head for 5 days
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
## 2012-04-17 ENCOUNTER — Encounter: Payer: Self-pay | Admitting: Internal Medicine

## 2012-04-17 ENCOUNTER — Ambulatory Visit (INDEPENDENT_AMBULATORY_CARE_PROVIDER_SITE_OTHER): Payer: Medicare Other | Admitting: Internal Medicine

## 2012-04-17 VITALS — BP 116/70 | HR 95 | Temp 98.0°F | Wt 138.2 lb

## 2012-04-17 DIAGNOSIS — J209 Acute bronchitis, unspecified: Secondary | ICD-10-CM

## 2012-04-17 DIAGNOSIS — J84111 Idiopathic interstitial pneumonia, not otherwise specified: Secondary | ICD-10-CM

## 2012-04-17 DIAGNOSIS — F17200 Nicotine dependence, unspecified, uncomplicated: Secondary | ICD-10-CM

## 2012-04-17 DIAGNOSIS — J310 Chronic rhinitis: Secondary | ICD-10-CM

## 2012-04-17 MED ORDER — HYDROCODONE-HOMATROPINE 5-1.5 MG/5ML PO SYRP: 5.0000 mL | ORAL_SOLUTION | Freq: Four times a day (QID) | ORAL | Status: AC | PRN

## 2012-04-17 MED ORDER — DOXYCYCLINE HYCLATE 100 MG PO TABS: ORAL_TABLET | ORAL | Status: DC
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
## 2012-04-17 NOTE — Patient Instructions (Addendum)
  Order for x-rays entered into  the computer; these will be performed at [address] [facility]. across from [facility]. No appointment is necessary.  Consider  [facility]'s smoking cessation program @ [email or web address] or [phone].   Please try to go on My Chart within the next 24 hours to allow me to release the results directly to you.

## 2012-04-18 ENCOUNTER — Ambulatory Visit (INDEPENDENT_AMBULATORY_CARE_PROVIDER_SITE_OTHER)
Admission: RE | Admit: 2012-04-18 | Discharge: 2012-04-18 | Disposition: A | Discharge status: Home / Self Care | Payer: Medicare Other | Source: Ambulatory Visit | Attending: Internal Medicine | Admitting: Internal Medicine

## 2012-04-18 DIAGNOSIS — J209 Acute bronchitis, unspecified: Secondary | ICD-10-CM

## 2012-04-25 ENCOUNTER — Telehealth: Payer: Self-pay | Admitting: *Deleted

## 2012-04-25 DIAGNOSIS — Z8709 Personal history of other diseases of the respiratory system: Secondary | ICD-10-CM

## 2012-04-25 NOTE — Telephone Encounter (Signed)
Pt called requesting results of CXR.  Pt was given Dr Caryl Never advice and she voiced her understanding.  Future order placed, pt aware

## 2012-04-30 ENCOUNTER — Telehealth: Payer: Self-pay

## 2012-04-30 NOTE — Telephone Encounter (Signed)
No answer, will try to reach patient again Friday

## 2012-04-30 NOTE — Telephone Encounter (Signed)
Message copied by Maurice Small on Wed Apr 30, 2012  4:52 PM ------      Message from: Pecola Lawless      Created: Sat Apr 19, 2012  8:40 AM       Can not rule out early infection in R lower lung.Please  blowup at least 10  balloons a day to enhance inflation of the lungs and prevent atelectasis (airway collapse & consolidation) .Call to repeat chest Xray in 7-10 days.Please call if there is a significant change in symptoms  or progression of severity of symptoms compared to those @ office visit.

## 2012-05-05 ENCOUNTER — Ambulatory Visit (INDEPENDENT_AMBULATORY_CARE_PROVIDER_SITE_OTHER)
Admission: RE | Admit: 2012-05-05 | Discharge: 2012-05-05 | Disposition: A | Discharge status: Home / Self Care | Payer: Medicare Other | Source: Ambulatory Visit | Attending: Internal Medicine | Admitting: Internal Medicine

## 2012-05-05 ENCOUNTER — Encounter: Payer: Self-pay | Admitting: Internal Medicine

## 2012-05-05 DIAGNOSIS — Z8709 Personal history of other diseases of the respiratory system: Secondary | ICD-10-CM

## 2012-05-07 NOTE — Telephone Encounter (Signed)
Pt requesting x-ray result from 05-05-12. Marland KitchenPlease advise

## 2012-05-08 NOTE — Telephone Encounter (Signed)
X-ray showed scarring manifested as increased  interstitial markings( connective tissue of lung). No evidence of any acute coronary process. A copy of report can be mailed to her

## 2012-05-08 NOTE — Telephone Encounter (Signed)
I spoke with patient and informed her that once results populate I will call her

## 2012-05-09 NOTE — Telephone Encounter (Signed)
Patient aware of results.

## 2012-05-09 NOTE — Telephone Encounter (Signed)
Error

## 2012-05-13 ENCOUNTER — Other Ambulatory Visit: Payer: Self-pay | Admitting: Internal Medicine

## 2012-05-13 MED ORDER — SERTRALINE HCL 50 MG PO TABS: 50.0000 mg | ORAL_TABLET | Freq: Every day | ORAL | Status: DC

## 2012-05-13 NOTE — Telephone Encounter (Signed)
refill Sertraline HCl (Tab) 50 MG Take 1 tablet by mouth every day #30 Last fill 6.17.13 Last ov 6.20.13

## 2012-05-13 NOTE — Telephone Encounter (Signed)
RX sent

## 2012-09-10 ENCOUNTER — Ambulatory Visit (INDEPENDENT_AMBULATORY_CARE_PROVIDER_SITE_OTHER): Payer: Medicare Other | Admitting: Internal Medicine

## 2012-09-10 ENCOUNTER — Encounter: Payer: Self-pay | Admitting: Internal Medicine

## 2012-09-10 VITALS — BP 120/78 | HR 99 | Temp 98.0°F | Wt 138.6 lb

## 2012-09-10 DIAGNOSIS — J84111 Idiopathic interstitial pneumonia, not otherwise specified: Secondary | ICD-10-CM

## 2012-09-10 DIAGNOSIS — R29890 Loss of height: Secondary | ICD-10-CM

## 2012-09-10 DIAGNOSIS — J209 Acute bronchitis, unspecified: Secondary | ICD-10-CM

## 2012-09-10 MED ORDER — HYDROCODONE-HOMATROPINE 5-1.5 MG/5ML PO SYRP: 5.0000 mL | ORAL_SOLUTION | Freq: Four times a day (QID) | ORAL | Status: DC | PRN

## 2012-09-10 MED ORDER — AZITHROMYCIN 250 MG PO TABS: ORAL_TABLET | ORAL | Status: DC

## 2012-09-10 NOTE — Patient Instructions (Addendum)
Please review the medication list in the After Visit Summary provided.Please write the name of the prescribing physician to the right of the medication and share this with all medical staff seen at each appointment. This will help provide continuity of care; help optimize therapeutic interventions;and help prevent drug:drug adverse reaction.  Order for x-rays entered into  the computer; these will be performed at [address] [facility]. across from [facility]. No appointment is necessary.  Recommended lifestyle interventions to prevent Osteoporosis include calcium 600 mg twice a day  & vitamin D3 supplementation to keep vit D  level @ least 40-60. The usual vitamin D3 dose is 1000 IU daily; but individual dose is determined by annual vitamin D level monitor. Also weight bearing exercise such as  walking 30-45 minutes 3-4  X per week is recommended.  Review and correct the record as indicated. Please share record with all medical staff seen.
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
## 2012-09-24 ENCOUNTER — Ambulatory Visit (INDEPENDENT_AMBULATORY_CARE_PROVIDER_SITE_OTHER)
Admission: RE | Admit: 2012-09-24 | Discharge: 2012-09-24 | Disposition: A | Discharge status: Home / Self Care | Payer: Medicare Other | Source: Ambulatory Visit | Attending: Internal Medicine | Admitting: Internal Medicine

## 2012-09-24 DIAGNOSIS — J84111 Idiopathic interstitial pneumonia, not otherwise specified: Secondary | ICD-10-CM

## 2012-09-24 DIAGNOSIS — J209 Acute bronchitis, unspecified: Secondary | ICD-10-CM

## 2012-11-20 ENCOUNTER — Other Ambulatory Visit: Payer: Self-pay | Admitting: Internal Medicine

## 2012-11-20 MED ORDER — SERTRALINE HCL 50 MG PO TABS: 50.0000 mg | ORAL_TABLET | Freq: Every day | ORAL | Status: DC

## 2012-11-20 NOTE — Telephone Encounter (Signed)
Rx sent 

## 2012-11-20 NOTE — Telephone Encounter (Signed)
refill  Sertraline HCl (Tab) 50 MG Take 1 tablet (50 mg total) by mouth daily. #30 wt-5-refills last fill 12.23.13

## 2012-12-10 ENCOUNTER — Other Ambulatory Visit (HOSPITAL_COMMUNITY): Payer: Self-pay | Admitting: Obstetrics and Gynecology

## 2012-12-10 DIAGNOSIS — Z1231 Encounter for screening mammogram for malignant neoplasm of breast: Secondary | ICD-10-CM

## 2012-12-13 ENCOUNTER — Other Ambulatory Visit: Payer: Self-pay

## 2013-01-15 ENCOUNTER — Ambulatory Visit (HOSPITAL_COMMUNITY)
Admission: RE | Admit: 2013-01-15 | Discharge: 2013-01-15 | Disposition: A | Discharge status: Home / Self Care | Payer: Medicare Other | Source: Ambulatory Visit | Attending: Obstetrics and Gynecology | Admitting: Obstetrics and Gynecology

## 2013-01-15 DIAGNOSIS — Z1231 Encounter for screening mammogram for malignant neoplasm of breast: Secondary | ICD-10-CM | POA: Insufficient documentation

## 2013-01-29 ENCOUNTER — Telehealth: Payer: Self-pay | Admitting: Internal Medicine

## 2013-01-29 NOTE — Telephone Encounter (Signed)
Will call back in AM

## 2013-01-30 NOTE — Telephone Encounter (Signed)
ATC Mike at this number [facility]

## 2013-02-02 NOTE — Telephone Encounter (Signed)
I called company and they wanted to verify pt upcoming appt so they can send over paperwork. Appt verified. Carron Curie, CMA

## 2013-02-03 ENCOUNTER — Telehealth: Payer: Self-pay | Admitting: Internal Medicine

## 2013-02-03 NOTE — Telephone Encounter (Signed)
Linda Kelly p/u fed ex envelope from the mail room.  I placed this envelope & it contents on Linda Kelly's desk.  Antionette Fairy

## 2013-02-04 ENCOUNTER — Ambulatory Visit (INDEPENDENT_AMBULATORY_CARE_PROVIDER_SITE_OTHER): Payer: Worker's Compensation | Admitting: Internal Medicine

## 2013-02-04 ENCOUNTER — Encounter: Payer: Self-pay | Admitting: Internal Medicine

## 2013-02-04 VITALS — BP 140/86 | HR 88 | Ht <= 58 in | Wt 142.4 lb

## 2013-02-04 DIAGNOSIS — F17200 Nicotine dependence, unspecified, uncomplicated: Secondary | ICD-10-CM

## 2013-02-04 DIAGNOSIS — J84111 Idiopathic interstitial pneumonia, not otherwise specified: Secondary | ICD-10-CM

## 2013-02-04 DIAGNOSIS — K219 Gastro-esophageal reflux disease without esophagitis: Secondary | ICD-10-CM
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
## 2013-02-04 NOTE — Patient Instructions (Addendum)
Try taking Omeprazole otc acid blocker, once daily, before a meal, x 1 month, instead of Zantac. See if it improves the occasional chest burning.  I will fill out the forms and see if they would cover getting an updated CT scan for you.

## 2013-02-10 ENCOUNTER — Telehealth: Payer: Self-pay | Admitting: Internal Medicine

## 2013-02-10 DIAGNOSIS — IMO0001 Reserved for inherently not codable concepts without codable children: Secondary | ICD-10-CM

## 2013-02-10 NOTE — Telephone Encounter (Signed)
Refill: Clopidogrel bisulfate 75 mg. Take one tablet each day. Qty 90. Sent from [facility]

## 2013-02-10 NOTE — Telephone Encounter (Signed)
Left message on Vm to verify with patient that Dr.Hopper is prescribing plavix for her. Not recently filled by [facility]

## 2013-02-10 NOTE — Telephone Encounter (Signed)
Spoke with CY about this-he is working on the paperwork-will send to him as a reminder to complete papers and OV notes. I attempted to call Joe at [facility] but could not get through on number provided-office closed and no option to speak with another rep.

## 2013-02-11 MED ORDER — CLOPIDOGREL BISULFATE 75 MG PO TABS: 75.0000 mg | ORAL_TABLET | Freq: Every day | ORAL | Status: DC

## 2013-02-11 NOTE — Telephone Encounter (Signed)
Patient returned your call. Call her back at [phone]

## 2013-02-11 NOTE — Telephone Encounter (Signed)
Called # provided above, spoke with Harvie Heck. Informed [facility] is working on this.  Will send information once completed. He voiced understanding.

## 2013-02-11 NOTE — Telephone Encounter (Signed)
RX sent

## 2013-02-11 NOTE — Telephone Encounter (Signed)
Spoke with patient, patient states Dr.Hopper has refilled her plavix for years

## 2013-02-13 NOTE — Assessment & Plan Note (Signed)
Chest x-ray changes as of 2013 are not clearly progressive. I am concerned for potential that reflux and aspiration might be an aggravating factor. Relative importance of tobacco smoking and her [employer] exposures are difficult to sort out. I will review her forms, with question of when to update chest CT and PFT.

## 2013-02-13 NOTE — Assessment & Plan Note (Signed)
Zantac is not providing optimal coverage. She is not sure what else her program will pay for. I suggested she try self-pay/over-the-counter omeprazole for one month.

## 2013-02-13 NOTE — Assessment & Plan Note (Signed)
She is making no effort to stop smoking despite obvious overlap of concerns with her [employer] exposure and known interstitial lung disease. I spoke with her again about available support for cessation.

## 2013-02-16 ENCOUNTER — Encounter: Payer: Self-pay | Admitting: *Deleted

## 2013-02-16 NOTE — Telephone Encounter (Signed)
Spoke with rep at [facility] that CY will need to fill out Diagnostic Consultation Form only at this time-will need to place what orders/tests he wants on patient in the Diagnostic Plan and see about date/time of PFT and CT chest no contrast CY wants ordered. Then fax back the information to [facility]. Orders have been placed to [facility]. I will call patient with PFT date/time and call patient will CT chest no contrast as well.

## 2013-02-17 ENCOUNTER — Other Ambulatory Visit: Payer: Self-pay | Admitting: Obstetrics and Gynecology

## 2013-02-18 ENCOUNTER — Telehealth: Payer: Self-pay

## 2013-02-18 ENCOUNTER — Institutional Professional Consult (permissible substitution): Payer: Self-pay | Admitting: Internal Medicine

## 2013-02-18 ENCOUNTER — Ambulatory Visit (INDEPENDENT_AMBULATORY_CARE_PROVIDER_SITE_OTHER)
Admission: RE | Admit: 2013-02-18 | Discharge: 2013-02-18 | Disposition: A | Discharge status: Home / Self Care | Payer: Medicare Other | Source: Ambulatory Visit | Attending: Internal Medicine | Admitting: Internal Medicine

## 2013-02-18 DIAGNOSIS — J8410 Pulmonary fibrosis, unspecified: Secondary | ICD-10-CM

## 2013-02-18 DIAGNOSIS — IMO0001 Reserved for inherently not codable concepts without codable children: Secondary | ICD-10-CM

## 2013-02-18 NOTE — Telephone Encounter (Signed)
Amy from Dr.Grewal's office called to give Hopp an FYI on a mutual patient. Patient was recently seen and appeared very forgetful, Dr.Grewal did breast exam x 2 due to patient not recalling first time that exam was performed. Patient would also forget what she is taking about in the middle of conversation. Patient also appeared unsteady.   Hopp please advise if any further follow-up is recommended for patient

## 2013-02-19 ENCOUNTER — Ambulatory Visit (INDEPENDENT_AMBULATORY_CARE_PROVIDER_SITE_OTHER): Payer: Medicare Other | Admitting: Internal Medicine

## 2013-02-19 DIAGNOSIS — J8410 Pulmonary fibrosis, unspecified: Secondary | ICD-10-CM

## 2013-02-19 DIAGNOSIS — IMO0001 Reserved for inherently not codable concepts without codable children: Secondary | ICD-10-CM

## 2013-02-19 LAB — PULMONARY FUNCTION TEST

## 2013-02-19 NOTE — Telephone Encounter (Signed)
I see patient has had scan and PFT done-- [name] have these forms been faxed to [facility]? Please advise, thank you!

## 2013-02-19 NOTE — Telephone Encounter (Signed)
I contacted patient and set up PFT. Pt is aware also that she had CT Chest yesterday. I faxed over the forms to [facility]. All forms are on my desk as this a continuing process with [facility].

## 2013-02-19 NOTE — Progress Notes (Signed)
PFT done today. 

## 2013-02-20 NOTE — Progress Notes (Signed)
Quick Note:  Pt aware of results. ______ 

## 2013-02-27 ENCOUNTER — Ambulatory Visit (INDEPENDENT_AMBULATORY_CARE_PROVIDER_SITE_OTHER): Payer: Medicare Other | Admitting: Internal Medicine

## 2013-02-27 ENCOUNTER — Encounter: Payer: Self-pay | Admitting: Internal Medicine

## 2013-02-27 VITALS — BP 122/84 | HR 98 | Wt 141.0 lb

## 2013-02-27 DIAGNOSIS — R202 Paresthesia of skin: Secondary | ICD-10-CM

## 2013-02-27 DIAGNOSIS — R269 Unspecified abnormalities of gait and mobility: Secondary | ICD-10-CM

## 2013-02-27 DIAGNOSIS — R413 Other amnesia: Secondary | ICD-10-CM

## 2013-02-27 DIAGNOSIS — I6529 Occlusion and stenosis of unspecified carotid artery: Secondary | ICD-10-CM

## 2013-02-27 DIAGNOSIS — R209 Unspecified disturbances of skin sensation: Secondary | ICD-10-CM

## 2013-02-27 DIAGNOSIS — R2689 Other abnormalities of gait and mobility: Secondary | ICD-10-CM

## 2013-02-27 DIAGNOSIS — I951 Orthostatic hypotension: Secondary | ICD-10-CM

## 2013-02-27 DIAGNOSIS — F17200 Nicotine dependence, unspecified, uncomplicated: Secondary | ICD-10-CM

## 2013-02-27 NOTE — Telephone Encounter (Signed)
Patient was seen today.

## 2013-02-27 NOTE — Progress Notes (Signed)
  Subjective:    Patient ID: Linda Kelly, female    DOB: 05/07/1928, 79 y.o.   MRN: [id number]  HPI At her gynecologic exam there was some question of memory issues. Linda Kelly denies any such issue. She has had some imbalance issues for approximately a year with walking for a period & positionally.    Review of Systems She denies significant headache ,limb weakness, or incontinence of urine or stool. Intermittent  extremity numbness & tingling in hands.     Objective:   Physical Exam Appears healthy and well-nourished & in no acute distress.Appears younger than stated age. Very stylishly dressed and well-groomed.  EOMI w/o nystagmus  Some hearing deficit bilaterally  Bilateral carotid bruits are present.No neck pain distention present at 10 - 15 degrees. Thyroid normal to palpation  Heart rhythm and rate are normal with grade 1 systolic murmur left sternal border  Chest reveals scattered pops and rales; no increased work of breathing  There is no evidence of aortic aneurysm or renal artery bruits  Abdomen soft with no organomegaly or masses. No HJR  No clubbing, cyanosis or edema present.  Pedal pulses are decreased  No ischemic skin changes are present . Nails healthy  Alert and oriented. Strength, tone, DTRs reflexes normal. Negative Tinel sign  Mild PIP degenerative joint changes  Gait appears normal. Romberg is minimally unstable. Finger-nose testing is normal  Mini-Mental Status exam revealed a score of 29 out of possible 30. Clock drawing test was excellent. She is able to name 10 animals (some very esoteric) in less than 30 seconds without repetition         Assessment & Plan:  #1 disequilibrium/balance issues. This is most likely related to atrophic cardiovascular disease based on the carotid bruits and decreased pedal pulses. She will most likely have vertebrobasilar insufficiency with probable osteophytes in the cervical spine.  #2 history suggest some postural  hypotension  #3 no evidence of memory deficit.  #4 intermittent hand numbness, probable positional carpal tunnel  Plan: Isometric exercises prior to standing recommended. Ongoing annual monitor of the carotid plaque. Smoking cessation again recommended

## 2013-02-27 NOTE — Patient Instructions (Addendum)
Repeat the isometric exercises discussed 4- 5 times prior to standing if you've been seated for a period of time. Avoid any position that tends to bring on the balance issues. Purchase any grasping device so that you do not  have to reach above head or squat or bend at the waist to pick up items.

## 2013-04-06 ENCOUNTER — Encounter: Payer: Self-pay | Admitting: Internal Medicine

## 2013-04-06 ENCOUNTER — Ambulatory Visit (INDEPENDENT_AMBULATORY_CARE_PROVIDER_SITE_OTHER): Payer: Medicare Other | Admitting: Internal Medicine

## 2013-04-06 VITALS — BP 110/72 | HR 92 | Ht <= 58 in | Wt 145.0 lb

## 2013-04-06 DIAGNOSIS — I2510 Atherosclerotic heart disease of native coronary artery without angina pectoris: Secondary | ICD-10-CM

## 2013-04-06 DIAGNOSIS — J449 Chronic obstructive pulmonary disease, unspecified: Secondary | ICD-10-CM

## 2013-04-06 DIAGNOSIS — J84111 Idiopathic interstitial pneumonia, not otherwise specified: Secondary | ICD-10-CM

## 2013-04-06 NOTE — Patient Instructions (Addendum)
We will explore the possibility that you could participate in the Perfenadone pulmonary fibrosis study  I think your good pulmonary function air-flow scores reflect interplay between the fibrosis and emphysema from your smoking. It is still in your best interest to stop smoking. That would help your lungs and your heart. Regular walking and other exercise is good to improve stamina.

## 2013-04-06 NOTE — Progress Notes (Signed)
Patient ID: Linda Kelly, female    DOB: July 27, 1928, 79 y.o.   MRN: [id number]  HPI 06/07/11- 70 yoF 1/2 PPD smoker followed for pulmonary fibrosis/ ILD, COPD complicated by CAD,PAD hx lung nodule, hx [employer] [place] exposure with long term f/u here.  Still says she is trying to cut back on her smoking "trying". Been having GI complaints- Dr Leone Payor colonoscopy. She admits tiredness overlapping with shortness of breath. She will nap and put her feet up. Not much cough- scant phlegm, no blood or purulent. Doing water aerobics 3 days/ week. Last here October 10, 2010 CXR 10/10/10- unchanged basilar fibrotic scarring- reviewed.   12/11/11-  82 yoF 1/2 PPD smoker followed for pulmonary fibrosis/ ILD, COPD complicated by CAD,PAD hx lung nodule, hx [employer] exposure with long term f/u here.  Now being followed for [place]/ [employer] bombing dust exposure at [place]- they did CXR and PFT recently.  She has felt "much better" although continues to smoke against advice. At "Y" 3 days/ week.Little cough or sputum. No pain, blood, nodes, fever. CXR- 11/06/11- stable mild interstitial prominence and interstitial fibrotic changes at bases.  PFT-06/04/11- FEV1 1.26/96%, FEV1/FVC 0.9 . Slight response to bronchodilator with normal flows. Residual volume 120%, DLCO 34% which is severely reduced.  02/04/13- 82 yoF 1/2 PPD smoker followed for pulmonary fibrosis/ ILD, COPD complicated by CAD,PAD hx lung nodule, hx [employer] exposure with long term f/u here.  Now being followed for [place]/ [employer] [place] dust exposure. She is still followed by occupational health but brings her extensive set of update forms here. Still smoking one half pack per day with no effort to stop. Continues water aerobics 3 times per week. Slight transient burning sensation in mid chest occasionally, relieved by resting for a while but not clearly exertional. 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 Aware of frequent heartburn and reflux.[facility] days for Zantac and Zoloft. Occasional wheeze. Little cough, dry. She does not feel she needs a metered inhaler. CXR 09/14/12 IMPRESSION:  Borderline cardiac size. Chronic interstitial fibrotic infiltrate  densities are again evident. No definite acute superimposed  process is identified. There is central peribronchial thickening.  No pleural effusion is evident.  Original Report Authenticated By: Onalee Hua Call   04/06/13- 82 yoF 1/2 PPD smoker followed for pulmonary fibrosis/ ILD, COPD complicated by CAD,PAD hx lung nodule, hx [employer] [place] exposure with long term f/u here.  FOLLOWS FOR: Breathing is unchanged. Here to review PFT and CT results. Less cough now than she had during the winter. Continues to smoke against advice and this was discussed again. Has had some sneezing rhinitis symptoms with the spring pollen. Admits feeling tired today. Denies chest pain, edema, discolored sputum, fever or lymph nodes. CT chest 02/20/13 IMPRESSION:  1. Mild progression of interstitial fibrosis with an appearance  most compatible with usual interstitial pneumonia.  2. Small ascending aortic aneurysm is slightly increased in size  since the prior study.  3. Calcific aortic and coronary atherosclerosis.  Original Report Authenticated By: Holley Dexter, M.D. PFT 04/21/13- spirometry within normal limits lung volumes within normal limits. Severe decrease in diffusion capacity. Insignificant response to bronchodilator.FVC 1.60/86%, FEV1 1.37/116%, FEV1/FVC 0.86, FEF 25-75% 2.10/133%. TLC 94%, DLCO 43%.   Review of Systems-see HPI Constitutional:   No-   weight loss, night sweats, fevers, chills,+ fatigue, lassitude. HEENT:   No-  headaches, difficulty swallowing,  tooth/dental problems, sore throat,       No-  sneezing, itching, ear ache, nasal congestion, post nasal drip,  CV:  No-   chest pain,  orthopnea, PND, swelling in lower extremities, anasarca, dizziness, palpitations Resp: +shortness of breath with exertion or at rest.              No-   productive cough,  + non-productive cough,  No-  coughing up of blood.              No-   change in color of mucus.  No- wheezing.   Skin: No-   rash or lesions. GI:  + heartburn, indigestion, no-abdominal pain, nausea, vomiting,  GU: MS:  No-   joint pain or swelling.  . Neuro- nothing unusual Psych:  No- change in mood or affect. No depression or anxiety.  No memory loss.   Objective:   Physical Exam General- Alert, Oriented, Affect-appropriate, Distress- none acute, looks well Skin- rash-none, lesions- none, excoriation- none Lymphadenopathy- none Head- atraumatic            Eyes- Gross vision intact, PERRLA, conjunctivae clear secretions            Ears- Hearing, canals normal            Nose- Clear, No-Septal dev, mucus, polyps, erosion, perforation             Throat- Mallampati II , mucosa clear , drainage- none, tonsils- atrophic;  dentures Neck- flexible , trachea midline, no stridor , thyroid nl, carotid no bruit Chest - symmetrical excursion , unlabored           Heart/CV- RRR , no murmur , no gallop  , no rub, nl s1 s2                           - JVD- none , edema- none, stasis changes- none, varices- none           Lung- +crackles to mid chest, wheeze- none, cough- none , dullness-none, rub- none           Chest wall-  Abd-  Br/ Gen/ Rectal- Not done, not indicated Extrem- cyanosis- none, clubbing- none, atrophy- none, strength- nl Neuro- word searching

## 2013-04-08 ENCOUNTER — Telehealth: Payer: Self-pay | Admitting: Internal Medicine

## 2013-04-08 NOTE — Telephone Encounter (Signed)
CY, Need OV notes from 04-06-13 completed as soon as possible so I may send the notes to [facility]. Thanks.

## 2013-04-09 NOTE — Assessment & Plan Note (Signed)
Fine crackles are more obvious on exam now and she does admit shortness of breath with exertion. She still smokes a few cigarettes. Dyspnea with exertion may be as related to her coronary artery disease as to her lungs.  I will discuss question of eligibility for Perfenidone study, based on radiologic impression of UIP.

## 2013-04-09 NOTE — Assessment & Plan Note (Signed)
She follows with her physicians for known coronary disease. Recognize potential cardiac contribution to dyspnea.

## 2013-04-09 NOTE — Assessment & Plan Note (Signed)
Except for the diffusion capacity, pulmonary function tests are unexpectedly good. This is probably an example of interstitial fibrosis stabilizing emphysema to prevent airway collapse and air trapping.

## 2013-04-10 NOTE — Telephone Encounter (Signed)
OV note competed; no testing done that day. I have faxed the requested information to [facility]. Nothing more needed and will sign off on message.

## 2013-04-23 ENCOUNTER — Other Ambulatory Visit: Payer: Self-pay | Admitting: Internal Medicine

## 2013-04-23 NOTE — Telephone Encounter (Signed)
Last OV 02/27/13. Refill done per protocol.

## 2013-07-07 ENCOUNTER — Other Ambulatory Visit: Payer: Self-pay | Admitting: Internal Medicine

## 2013-08-06 ENCOUNTER — Ambulatory Visit (INDEPENDENT_AMBULATORY_CARE_PROVIDER_SITE_OTHER): Payer: Medicare Other | Admitting: Internal Medicine

## 2013-08-06 ENCOUNTER — Encounter: Payer: Self-pay | Admitting: Internal Medicine

## 2013-08-06 VITALS — BP 110/68 | HR 89 | Ht <= 58 in | Wt 139.8 lb

## 2013-08-06 DIAGNOSIS — Z23 Encounter for immunization: Secondary | ICD-10-CM

## 2013-08-06 DIAGNOSIS — K219 Gastro-esophageal reflux disease without esophagitis: Secondary | ICD-10-CM

## 2013-08-06 DIAGNOSIS — J8410 Pulmonary fibrosis, unspecified: Secondary | ICD-10-CM

## 2013-08-06 DIAGNOSIS — F17200 Nicotine dependence, unspecified, uncomplicated: Secondary | ICD-10-CM

## 2013-08-06 DIAGNOSIS — I2510 Atherosclerotic heart disease of native coronary artery without angina pectoris: Secondary | ICD-10-CM

## 2013-08-06 NOTE — Progress Notes (Signed)
Patient ID: Linda Kelly, female    DOB: [birth year], 79 y.o.   MRN: [id number]  HPI 06/07/11- 28 yoF 1/2 PPD smoker followed for pulmonary fibrosis/ ILD, COPD complicated by CAD,PAD hx lung nodule, hx [employer] [place] exposure with long term f/u here.  Still says she is trying to cut back on her smoking "trying". Been having GI complaints- Dr Carlean Purl colonoscopy. She admits tiredness overlapping with shortness of breath. She will nap and put her feet up. Not much cough- scant phlegm, no blood or purulent. Doing water aerobics 3 days/ week. Last here October 10, 2010 CXR 10/10/10- unchanged basilar fibrotic scarring- reviewed.   12/11/11-  79 yoF 1/2 PPD smoker followed for pulmonary fibrosis/ ILD, COPD complicated by CAD,PAD hx lung nodule, hx [employer] exposure with long term f/u here.  Now being followed for [place]/ [place] dust exposure at [place]- they did CXR and PFT recently.  She has felt "much better" although continues to smoke against advice. At "Y" 3 days/ week.Little cough or sputum. No pain, blood, nodes, fever. CXR- 11/06/11- stable mild interstitial prominence and interstitial fibrotic changes at bases.  PFT-06/04/11- FEV1 1.26/96%, FEV1/FVC 0.9 . Slight response to bronchodilator with normal flows. Residual volume 120%, DLCO 34% which is severely reduced.  02/04/13- 69 yoF 1/2 PPD smoker followed for pulmonary fibrosis/ ILD, COPD complicated by CAD,PAD hx lung nodule, hx [employer] exposure with long term f/u here.  Now being followed for [place]/ [place] dust exposure. She is still followed by occupational health but brings her extensive set of update forms here. Still smoking one half pack per day with no effort to stop. Continues water aerobics 3 times per week. Slight transient burning sensation in mid chest occasionally, relieved by resting for a while but not clearly exertional. 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 Aware of frequent heartburn and reflux.[facility] days for Zantac and Zoloft. Occasional wheeze. Little cough, dry. She does not feel she needs a metered inhaler. CXR 09/14/12 IMPRESSION:  Borderline cardiac size. Chronic interstitial fibrotic infiltrate  densities are again evident. No definite acute superimposed  process is identified. There is central peribronchial thickening.  No pleural effusion is evident.  Original Report Authenticated By: Shanon Brow Call   04/06/13- 82 yoF 1/2 PPD smoker followed for pulmonary fibrosis/ ILD, COPD complicated by CAD,PAD hx lung nodule, hx [employer] exposure with long term f/u here.  FOLLOWS FOR: Breathing is unchanged. Here to review PFT and CT results. Less cough now than she had during the winter. Continues to smoke against advice and this was discussed again. Has had some sneezing rhinitis symptoms with the spring pollen. Admits feeling tired today. Denies chest pain, edema, discolored sputum, fever or lymph nodes. CT chest 02/20/13 IMPRESSION:  1. Mild progression of interstitial fibrosis with an appearance  most compatible with usual interstitial pneumonia.  2. Small ascending aortic aneurysm is slightly increased in size  since the prior study.  3. Calcific aortic and coronary atherosclerosis.  Original Report Authenticated By: Orlean Patten, M.D. PFT 04/21/13- spirometry within normal limits lung volumes within normal limits. Severe decrease in diffusion capacity. Insignificant response to bronchodilator.FVC 1.60/86%, FEV1 1.37/116%, FEV1/FVC 0.86, FEF 25-75% 2.10/133%. TLC 94%, DLCO 43%.  08/06/13- 40 yoF 1/2 PPD smoker followed for pulmonary fibrosis/ ILD, COPD complicated by CAD,PAD hx lung nodule, hx [employer] exposure with long term  f/u here.  FOLLOWS FOR:  Breathing slightly worse since last OV.  SOB increased with exertion and coughing w/ clear mucus She continues  to smoke against all efforts and advice, and is not trying to quit. Admits maybe a little easier dyspnea on exertion, not much cough. Recognized one episode of reflux, takes omeprazole. Occasional chest tightness, nonradiating, no pain, not clearly exertional. these Review of Systems-see HPI Constitutional:   No-   weight loss, night sweats, fevers, chills,+ fatigue, lassitude. HEENT:   No-  headaches, difficulty swallowing, tooth/dental problems, sore throat,       No-  sneezing, itching, ear ache, nasal congestion, post nasal drip,  CV:  No-   chest pain, orthopnea, PND, swelling in lower extremities, anasarca, dizziness, palpitations Resp: +shortness of breath with exertion or at rest.              No-   productive cough,  + non-productive cough,  No-  coughing up of blood.              No-   change in color of mucus.  No- wheezing.   Skin: No-   rash or lesions. GI:  + heartburn, indigestion, no-abdominal pain, nausea, vomiting,  GU: MS:  No-   joint pain or swelling.  . Neuro- nothing unusual Psych:  No- change in mood or affect. No depression or anxiety.  No memory loss.   Objective:   Physical Exam General- Alert, Oriented, Affect-appropriate, Distress- none acute, looks well Skin- rash-none, lesions- none, excoriation- none Lymphadenopathy- none Head- atraumatic            Eyes- Gross vision intact, PERRLA, conjunctivae clear secretions            Ears- Hearing, canals normal            Nose- Clear, No-Septal dev, mucus, polyps, erosion, perforation             Throat- Mallampati II , mucosa clear , drainage- none, tonsils- atrophic;  dentures Neck- flexible , trachea midline, no stridor , thyroid nl, carotid no bruit Chest - symmetrical excursion , unlabored           Heart/CV- RRR , no murmur , no gallop  , no rub, nl s1 s2                           - JVD- none , edema- none, stasis changes- none, varices- none           Lung- +crackles to mid chest, wheeze- none, cough-  none , dullness-none, rub- none           Chest wall-  Abd-  Br/ Gen/ Rectal- Not done, not indicated Extrem- cyanosis- none, clubbing- none, atrophy- none, strength- nl Neuro- word searching

## 2013-08-06 NOTE — Patient Instructions (Signed)
Pneumovax-23  Please keep trying to stop smoking  Do what you can to avoid reflux- don't lie down with a full stomach, continue your omeprazole

## 2013-08-07 ENCOUNTER — Telehealth: Payer: Self-pay | Admitting: Internal Medicine

## 2013-08-07 NOTE — Telephone Encounter (Signed)
Called and spoke with pt and she stated that she feels very sick, her arm hurts, no energy, cough that is dry.  Pt stated that she is having a low grade temp on 99.0 this morning.  She stated that she got the PNA vaccine yesterday at her OV.  Arm is warm to touch but no swelling.  Pt is requesting recs from [facility].  Thanks  No Known Allergies   Last ov--08/06/2013 Next ov--02/04/2014

## 2013-08-07 NOTE — Telephone Encounter (Signed)
I spoke with pt and made her aware of recs. She voiced her understanding and needed nothing further 

## 2013-08-07 NOTE — Telephone Encounter (Signed)
Per CY-1)Tylenol and fluids 2) work arm 3)heating pad may feel good; this will pass in a day or two.  4) If is drags on, it may be a virus bronchitis rather than a reaction to the shot.

## 2013-08-22 NOTE — Assessment & Plan Note (Signed)
Reflux precautions 

## 2013-08-22 NOTE — Assessment & Plan Note (Signed)
Pattern looks like UIP. We discussed perfenadone Plan-continue vaccine

## 2013-08-22 NOTE — Assessment & Plan Note (Signed)
I emphasized the importance of complete smoking cessation for this woman with lung disease and coronary artery disease. I again offered her support.

## 2013-08-22 NOTE — Assessment & Plan Note (Signed)
Educated to watch for possible angina and discussed with Dr Alwyn Ren as appropriate

## 2013-09-14 ENCOUNTER — Other Ambulatory Visit: Payer: Self-pay | Admitting: Internal Medicine

## 2013-09-14 NOTE — Telephone Encounter (Signed)
Sertraline refilled per protocol. 

## 2013-09-27 ENCOUNTER — Other Ambulatory Visit: Payer: Self-pay | Admitting: Internal Medicine

## 2013-09-28 NOTE — Telephone Encounter (Signed)
Ranitidine refilled per protocol 

## 2013-10-14 ENCOUNTER — Other Ambulatory Visit: Payer: Self-pay | Admitting: *Deleted

## 2013-10-14 MED ORDER — CLOPIDOGREL BISULFATE 75 MG PO TABS: 75.0000 mg | ORAL_TABLET | Freq: Every day | ORAL | Status: DC

## 2013-10-15 ENCOUNTER — Other Ambulatory Visit: Payer: Self-pay | Admitting: Internal Medicine

## 2013-10-15 NOTE — Telephone Encounter (Signed)
Sertraline refilled per protocol.

## 2013-10-19 ENCOUNTER — Telehealth: Payer: Self-pay | Admitting: *Deleted

## 2013-10-19 ENCOUNTER — Other Ambulatory Visit: Payer: Self-pay | Admitting: *Deleted

## 2013-10-19 DIAGNOSIS — I25810 Atherosclerosis of coronary artery bypass graft(s) without angina pectoris: Secondary | ICD-10-CM

## 2013-10-19 MED ORDER — CLOPIDOGREL BISULFATE 75 MG PO TABS: 75.0000 mg | ORAL_TABLET | Freq: Every day | ORAL | Status: DC

## 2013-10-19 NOTE — Telephone Encounter (Signed)
Refill for plavix sent to [facility] pharmacy in [place]

## 2013-10-28 ENCOUNTER — Telehealth: Payer: Self-pay | Admitting: Internal Medicine

## 2013-10-28 ENCOUNTER — Other Ambulatory Visit: Payer: Self-pay | Admitting: *Deleted

## 2013-10-28 ENCOUNTER — Other Ambulatory Visit: Payer: Self-pay | Admitting: Internal Medicine

## 2013-10-28 DIAGNOSIS — I25810 Atherosclerosis of coronary artery bypass graft(s) without angina pectoris: Secondary | ICD-10-CM

## 2013-10-28 MED ORDER — CLOPIDOGREL BISULFATE 75 MG PO TABS: 75.0000 mg | ORAL_TABLET | Freq: Every day | ORAL | Status: DC

## 2013-10-28 NOTE — Telephone Encounter (Signed)
Script for plavix faxed to patient's pharmacy. JG//CMA

## 2013-10-28 NOTE — Telephone Encounter (Signed)
Ranitidine refilled per protocol. JG//CMA 

## 2013-10-28 NOTE — Telephone Encounter (Signed)
Pharm Store is calling in regards to the patient's rx for Plavix. Patient's Rx can be sent to them via fax at Fax#: [phone]. They do not use e-scribe so rx can only be sent via fax or phoned in. They also go by the name [facility] pharmacy but are not associated with the [facility] in [place], [place]. They are based in [place]. Please re-send.

## 2013-11-21 ENCOUNTER — Other Ambulatory Visit: Payer: Self-pay | Admitting: Internal Medicine

## 2013-11-23 NOTE — Telephone Encounter (Signed)
Sertraline refilled per protocol. JG//CMA 

## 2013-12-03 ENCOUNTER — Other Ambulatory Visit: Payer: Self-pay | Admitting: Internal Medicine

## 2013-12-04 NOTE — Telephone Encounter (Signed)
Rx sent to the pharmacy by e-script.//AB/CMA 

## 2013-12-22 ENCOUNTER — Other Ambulatory Visit: Payer: Self-pay | Admitting: Internal Medicine

## 2013-12-22 DIAGNOSIS — Z1231 Encounter for screening mammogram for malignant neoplasm of breast: Secondary | ICD-10-CM

## 2013-12-28 ENCOUNTER — Encounter: Payer: Self-pay | Admitting: Internal Medicine

## 2013-12-28 ENCOUNTER — Other Ambulatory Visit (INDEPENDENT_AMBULATORY_CARE_PROVIDER_SITE_OTHER): Payer: Managed Care, Other (non HMO)

## 2013-12-28 ENCOUNTER — Ambulatory Visit (INDEPENDENT_AMBULATORY_CARE_PROVIDER_SITE_OTHER): Payer: Managed Care, Other (non HMO) | Admitting: Internal Medicine

## 2013-12-28 VITALS — BP 120/70 | HR 96 | Temp 98.4°F | Wt 133.0 lb

## 2013-12-28 DIAGNOSIS — R1033 Periumbilical pain: Secondary | ICD-10-CM

## 2013-12-28 DIAGNOSIS — K589 Irritable bowel syndrome without diarrhea: Secondary | ICD-10-CM

## 2013-12-28 DIAGNOSIS — R52 Pain, unspecified: Secondary | ICD-10-CM

## 2013-12-28 LAB — CBC WITH DIFFERENTIAL/PLATELET
BASOS ABS: 0.1 10*3/uL (ref 0.0–0.1)
Basophils Relative: 0.4 % (ref 0.0–3.0)
Eosinophils Absolute: 0.1 10*3/uL (ref 0.0–0.7)
Eosinophils Relative: 1.1 % (ref 0.0–5.0)
HEMATOCRIT: 39.9 % (ref 36.0–46.0)
Hemoglobin: 13.6 g/dL (ref 12.0–15.0)
LYMPHS ABS: 2.3 10*3/uL (ref 0.7–4.0)
Lymphocytes Relative: 19.8 % (ref 12.0–46.0)
MCHC: 34 g/dL (ref 30.0–36.0)
MCV: 92.2 fl (ref 78.0–100.0)
MONO ABS: 0.9 10*3/uL (ref 0.1–1.0)
Monocytes Relative: 7.9 % (ref 3.0–12.0)
NEUTROS PCT: 70.8 % (ref 43.0–77.0)
Neutro Abs: 8.3 10*3/uL — ABNORMAL HIGH (ref 1.4–7.7)
PLATELETS: 269 10*3/uL (ref 150.0–400.0)
RBC: 4.33 Mil/uL (ref 3.87–5.11)
RDW: 14.7 % — AB (ref 11.5–14.6)
WBC: 11.8 10*3/uL — AB (ref 4.5–10.5)

## 2013-12-28 MED ORDER — TRAMADOL HCL 50 MG PO TABS: 50.0000 mg | ORAL_TABLET | Freq: Three times a day (TID) | ORAL | Status: DC | PRN

## 2013-12-28 NOTE — Patient Instructions (Addendum)
Please take the probiotic , Align, every day until the bowels are normal. This will replace the normal bacteria which  are necessary for formation of normal stool and processing of food. Please complete and return stool cards; these will determine whether there is any gastrointestinal bleeding risk.

## 2013-12-28 NOTE — Progress Notes (Signed)
   Subjective:    Patient ID: Linda Kelly, female    DOB: [birth year], 80 y.o.   MRN: [id number]  HPI Began having aching intermittent abdominal pain (3/10) across mid abdomen 1 week ago. Does not radiate. Has pain frequently before BMs but also has pain at other times. Nothing exacerbates pain. BMs have increased from 2-3 /day to 5/day over the past 2-3 weeks. Has had increased mucous with BMs and stools are smaller caliber than usual. Denies nausea/vomiting. Appetite not as good as usual. Pain is not meal related or relieved by food intake.Does have some heartburn. Has occasional bright red blood when she strains from what she describes as hemorrhoids.  Denies black, tarry stools. Feels she as lost some weight over past 30 days.   Benign lymphoid aggregates 2007on colon biopsy  @ colonoscopy.  Review of Systems   Denies flank pain or pain with urination. No  pus in urine.  Denies fever, chills, or sweats.       Objective:   Physical Exam  General appearance is one of  adequate nourishment w/o distress. Appears younger than stated age  Eyes: No conjunctival inflammation or scleral icterus is present.  Oral exam: Dentures; lips and gums are healthy appearing.There is no oropharyngeal erythema or exudate noted.   Heart:  Normal rate and regular rhythm. S1 and S2 normal without gallop, murmur, click, rub or other extra sounds  .S4 with slurring    Lungs:Dry rales lower lobes.No increased work of breathing.   Abdomen: bowel sounds normal, soft  but slightly tender in mid abdomen without masses, organomegaly or hernias noted.  No guarding or rebound .Aorta palpable ; no AAA  Skin:Warm & dry.  Intact without suspicious lesions or rashes ; no jaundice or tenting  Lymphatic: No lymphadenopathy is noted about the head, neck, axilla  Oriented but responses slow            Assessment & Plan:  #1 mid abdominal pain in context of PMH IBS See orders

## 2013-12-28 NOTE — Progress Notes (Signed)
HPI: Began having aching intermittent abdominal  Pain (3/10) across upper abdomen 1 week ago. Does not radiate. Has pain frequently before BMs but also has pain at other times. Nothing exacerbates pain. BMs have increased from 2-3 /day to 5/day over the past 2-3 weeks. Has had increased mucous with BMs and stools are smaller caliber than usual. Denies nausea/vomiting. Appetite not as good as usual. Does have some heartburn. Has occasional  bright red blood when she strains from what she describes as hemorrhoids. Denies black, tarry stools. Feels she as lost some weight over past 30 days.  ROS: Denis flank pain or pain with urination. No fever or pus in urine.   Denies fever, chills, or sweats.

## 2013-12-28 NOTE — Progress Notes (Signed)
Pre visit review using our clinic review tool, if applicable. No additional management support is needed unless otherwise documented below in the visit note. 

## 2014-01-01 ENCOUNTER — Other Ambulatory Visit: Payer: Self-pay | Admitting: *Deleted

## 2014-01-01 ENCOUNTER — Other Ambulatory Visit (INDEPENDENT_AMBULATORY_CARE_PROVIDER_SITE_OTHER): Payer: Medicare HMO

## 2014-01-01 DIAGNOSIS — R1033 Periumbilical pain: Secondary | ICD-10-CM

## 2014-01-01 DIAGNOSIS — K589 Irritable bowel syndrome without diarrhea: Secondary | ICD-10-CM

## 2014-01-01 DIAGNOSIS — R52 Pain, unspecified: Secondary | ICD-10-CM

## 2014-01-01 LAB — HEMOCCULT SLIDES (X 3 CARDS)
Fecal Occult Blood: NEGATIVE
OCCULT 1: NEGATIVE
OCCULT 2: NEGATIVE
OCCULT 3: NEGATIVE
OCCULT 4: NEGATIVE
OCCULT 5: NEGATIVE

## 2014-01-19 ENCOUNTER — Ambulatory Visit (HOSPITAL_COMMUNITY): Payer: Medicare Other

## 2014-01-19 ENCOUNTER — Ambulatory Visit (HOSPITAL_COMMUNITY)
Admission: RE | Admit: 2014-01-19 | Discharge: 2014-01-19 | Disposition: A | Discharge status: Home / Self Care | Payer: Medicare HMO | Source: Ambulatory Visit | Attending: Internal Medicine | Admitting: Internal Medicine

## 2014-01-19 DIAGNOSIS — Z1231 Encounter for screening mammogram for malignant neoplasm of breast: Secondary | ICD-10-CM | POA: Insufficient documentation

## 2014-02-04 ENCOUNTER — Other Ambulatory Visit: Payer: Self-pay | Admitting: Occupational Medicine

## 2014-02-04 ENCOUNTER — Encounter: Payer: Self-pay | Admitting: Internal Medicine

## 2014-02-04 ENCOUNTER — Ambulatory Visit: Payer: Self-pay

## 2014-02-04 ENCOUNTER — Ambulatory Visit (INDEPENDENT_AMBULATORY_CARE_PROVIDER_SITE_OTHER): Payer: Medicare HMO | Admitting: Internal Medicine

## 2014-02-04 VITALS — BP 122/62 | HR 85 | Ht <= 58 in | Wt 135.4 lb

## 2014-02-04 DIAGNOSIS — I700 Atherosclerosis of aorta: Secondary | ICD-10-CM

## 2014-02-04 DIAGNOSIS — J84111 Idiopathic interstitial pneumonia, not otherwise specified: Secondary | ICD-10-CM

## 2014-02-04 DIAGNOSIS — Z720 Tobacco use: Secondary | ICD-10-CM

## 2014-02-04 DIAGNOSIS — Z0000 Encounter for general adult medical examination without abnormal findings: Secondary | ICD-10-CM

## 2014-02-04 DIAGNOSIS — J8410 Pulmonary fibrosis, unspecified: Secondary | ICD-10-CM

## 2014-02-04 DIAGNOSIS — J449 Chronic obstructive pulmonary disease, unspecified: Secondary | ICD-10-CM

## 2014-02-04 DIAGNOSIS — I739 Peripheral vascular disease, unspecified: Secondary | ICD-10-CM | POA: Insufficient documentation

## 2014-02-04 DIAGNOSIS — I6529 Occlusion and stenosis of unspecified carotid artery: Secondary | ICD-10-CM

## 2014-02-04 DIAGNOSIS — F17200 Nicotine dependence, unspecified, uncomplicated: Secondary | ICD-10-CM

## 2014-02-04 NOTE — Assessment & Plan Note (Signed)
We reviewed CT together. This could be DIP from smoking, from dust exposure at [facility] collapse, or idiopathic( in which case perfenadone might be consideration). Plan- update CT w/ HiRes cuts to better define pattern and rate of progression. Get today's spirometry from [facility]. Smoking cessation important.

## 2014-02-04 NOTE — Assessment & Plan Note (Signed)
Counseling reinforced 

## 2014-02-04 NOTE — Progress Notes (Signed)
Patient ID: Linda Kelly, female    DOB: [birth year], 80 y.o.   MRN: [id number]  HPI 06/07/11- 28 yoF 1/2 PPD smoker followed for pulmonary fibrosis/ ILD, COPD complicated by CAD,PAD hx lung nodule, hx [employer] exposure with long term f/u here.  Still says she is trying to cut back on her smoking "trying". Been having GI complaints- Dr Carlean Purl colonoscopy. She admits tiredness overlapping with shortness of breath. She will nap and put her feet up. Not much cough- scant phlegm, no blood or purulent. Doing water aerobics 3 days/ week. Last here October 10, 2010 CXR 10/10/10- unchanged basilar fibrotic scarring- reviewed.   12/11/11-  79 yoF 1/2 PPD smoker followed for pulmonary fibrosis/ ILD, COPD complicated by CAD,PAD hx lung nodule, hx [employer] exposure with long term f/u here.  Now being followed for [place]/ [place] dust exposure at [place]- they did CXR and PFT recently.  She has felt "much better" although continues to smoke against advice. At "Y" 3 days/ week.Little cough or sputum. No pain, blood, nodes, fever. CXR- 11/06/11- stable mild interstitial prominence and interstitial fibrotic changes at bases.  PFT-06/04/11- FEV1 1.26/96%, FEV1/FVC 0.9 . Slight response to bronchodilator with normal flows. Residual volume 120%, DLCO 34% which is severely reduced.  02/04/13- 69 yoF 1/2 PPD smoker followed for pulmonary fibrosis/ ILD, COPD complicated by CAD,PAD hx lung nodule, hx [employer] exposure with long term f/u here.  Now being followed for [place]/ [place] dust exposure. She is still followed by occupational health but brings her extensive set of update forms here. Still smoking one half pack per day with no effort to stop. Continues water aerobics 3 times per week. Slight transient burning sensation in mid chest occasionally, relieved by resting for a while but not clearly exertional. 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 Aware of frequent heartburn and reflux.[facility] days for Zantac and Zoloft. Occasional wheeze. Little cough, dry. She does not feel she needs a metered inhaler. CXR 09/14/12 IMPRESSION:  Borderline cardiac size. Chronic interstitial fibrotic infiltrate  densities are again evident. No definite acute superimposed  process is identified. There is central peribronchial thickening.  No pleural effusion is evident.  Original Report Authenticated By: Shanon Brow Call   04/06/13- 82 yoF 1/2 PPD smoker followed for pulmonary fibrosis/ ILD, COPD complicated by CAD,PAD hx lung nodule, hx [employer] exposure with long term f/u here.  FOLLOWS FOR: Breathing is unchanged. Here to review PFT and CT results. Less cough now than she had during the winter. Continues to smoke against advice and this was discussed again. Has had some sneezing rhinitis symptoms with the spring pollen. Admits feeling tired today. Denies chest pain, edema, discolored sputum, fever or lymph nodes. CT chest 02/20/13 IMPRESSION:  1. Mild progression of interstitial fibrosis with an appearance  most compatible with usual interstitial pneumonia.  2. Small ascending aortic aneurysm is slightly increased in size  since the prior study.  3. Calcific aortic and coronary atherosclerosis.  Original Report Authenticated By: Orlean Patten, M.D. PFT 04/21/13- spirometry within normal limits lung volumes within normal limits. Severe decrease in diffusion capacity. Insignificant response to bronchodilator.FVC 1.60/86%, FEV1 1.37/116%, FEV1/FVC 0.86, FEF 25-75% 2.10/133%. TLC 94%, DLCO 43%.  08/06/13- 40 yoF 1/2 PPD smoker followed for pulmonary fibrosis/ ILD, COPD complicated by CAD,PAD hx lung nodule, hx [employer] exposure with long term  f/u here.  FOLLOWS FOR:  Breathing slightly worse since last OV.  SOB increased with exertion and coughing w/ clear mucus She continues  to smoke against all efforts and advice, and is not trying to quit. Admits maybe a little easier dyspnea on exertion, not much cough. Recognized one episode of reflux, takes omeprazole. Occasional chest tightness, nonradiating, no pain, not clearly exertional.  02/04/14- 7 yoF 1/2 PPD smoker followed for pulmonary fibrosis/ ILD, COPD complicated by CAD,PAD hx lung nodule, hx [employer] exposure with long term f/u here. FOLLOWS FOR: Pt states her breathing is about the same as last visit; Activity causes her to have SOB. Less cough lately than in past. Little phlegm. No CP or palpitation. Was seen for [facility] f/u labs this AM by [facility]/ Dr Geoffry Paradise- they sent her here w/ EKG. She does not see their physician. Labs are forwarded to [facility] claims center. EKG 02/04/14- small inferior Q's, no acute. Mentions episode L hand numbness 2 weeks ago, lasted most of day. No syncope, blurring, HA. Continues BASA. Hx L CEA years ago/ Dr Donnetta Hutching. PFT-02/19/13- Normal flows, insignif response to BD, severely reduced DLCO. FVC 1.60/ 86%, FEV1 1.37/ 116%, FEV1/FVC 0.86, FEF25-75% 2.10/ 133%. TLC 94%, DLCO 43%. CT 02/20/13 IMPRESSION:  1. Mild progression of interstitial fibrosis with an appearance  most compatible with usual interstitial pneumonia.  2. Small ascending aortic aneurysm is slightly increased in size  since the prior study.  3. Calcific aortic and coronary atherosclerosis.  Original Report Authenticated By: Orlean Patten, M.D. CXR 02/04/14 IMPRESSION:  COPD with scarring.  Small left pleural effusion.  Electronically Signed  By: Franchot Gallo M.D.  On: 02/04/2014 09:40  Review of Systems-see HPI Constitutional:   No-   weight loss, night sweats, fevers, chills,+ fatigue, lassitude. HEENT:   No-  headaches, difficulty swallowing, tooth/dental problems, sore throat,       No-  sneezing, itching, ear ache, nasal congestion, post nasal drip,  CV:  No-   chest pain, orthopnea, PND,  swelling in lower extremities, anasarca, dizziness, palpitations Resp: +shortness of breath with exertion or at rest.              No-   productive cough,  + non-productive cough,  No-  coughing up of blood.              No-   change in color of mucus.  No- wheezing.   Skin: No-   rash or lesions. GI:  + heartburn, indigestion, no-abdominal pain, nausea, vomiting,  GU: MS:  No-   joint pain or swelling.  . Neuro- +recent L arm numbness Psych:  No- change in mood or affect. No depression or anxiety.  No memory loss.  Objective:   Physical Exam General- Alert, Oriented, Affect-appropriate, Distress- none acute, looks well Skin- rash-none, lesions- none, excoriation- none Lymphadenopathy- none Head- atraumatic            Eyes- Gross vision intact, PERRLA, conjunctivae clear secretions            Ears- Hearing, canals normal            Nose- Clear, No-Septal dev, mucus, polyps, erosion, perforation             Throat- Mallampati II , mucosa clear , drainage- none, tonsils- atrophic;  dentures Neck- flexible , trachea midline, no stridor , thyroid nl, carotid +R bruit Chest - symmetrical excursion , unlabored  Heart/CV- RRR , no murmur , no gallop  , no rub, nl s1 s2                           - JVD- none , edema- none, stasis changes- none, varices- none           Lung- +crackles to mid chest, unlabored,  wheeze- none, cough- none , dullness-none,                          rub- none 96% RA           Chest wall-  Abd-  Br/ Gen/ Rectal- Not done, not indicated Extrem- cyanosis- none, clubbing- none, atrophy- none, strength- nl Neuro- word searching

## 2014-02-04 NOTE — Assessment & Plan Note (Signed)
Dsepite a ctive smoking, there is little obstructive airways disease.

## 2014-02-04 NOTE — Assessment & Plan Note (Signed)
Atherosclerosis coronaries, carotids, thoracic aorta

## 2014-02-04 NOTE — Patient Instructions (Addendum)
Order-  "High Resolution" CT chest no contrast   Dx pulmonary fibrosis  Please do not smoke !  We will contact [facility]/ Dr Geoffry Paradise for copy of your PFT   We are scanning your EKG into the computer  I will copy Dr Linna Darner about the numbness you had in your left hand 2 weeks ago and the bruit noise I hear in your right carotid artery. We know that you have atherosclerosis and we don't want you to have a stroke. Continue your daily aspirin.   Order- Referral to Dr Curt Jews    Dx symptomatic Right carotid bruit

## 2014-02-04 NOTE — Assessment & Plan Note (Signed)
Hx L CEA- Dr Donnetta Hutching. Now has bruit R carotid and describes numbness L hand/ lower arm lasting most of a day, in mid-March, 2015. Plan - continue BASA. Referral to Dr Early for carotid disease.

## 2014-02-08 ENCOUNTER — Other Ambulatory Visit: Payer: Self-pay | Admitting: *Deleted

## 2014-02-08 DIAGNOSIS — R209 Unspecified disturbances of skin sensation: Secondary | ICD-10-CM

## 2014-02-08 DIAGNOSIS — R0989 Other specified symptoms and signs involving the circulatory and respiratory systems: Secondary | ICD-10-CM

## 2014-02-09 ENCOUNTER — Ambulatory Visit (INDEPENDENT_AMBULATORY_CARE_PROVIDER_SITE_OTHER)
Admission: RE | Admit: 2014-02-09 | Discharge: 2014-02-09 | Disposition: A | Discharge status: Home / Self Care | Payer: Medicare HMO | Source: Ambulatory Visit | Attending: Internal Medicine | Admitting: Internal Medicine

## 2014-02-09 DIAGNOSIS — J8410 Pulmonary fibrosis, unspecified: Secondary | ICD-10-CM

## 2014-02-14 ENCOUNTER — Other Ambulatory Visit: Payer: Self-pay | Admitting: Internal Medicine

## 2014-02-15 ENCOUNTER — Encounter: Payer: Self-pay | Admitting: Vascular Surgery

## 2014-02-16 ENCOUNTER — Other Ambulatory Visit (HOSPITAL_COMMUNITY): Payer: Self-pay

## 2014-02-16 ENCOUNTER — Encounter: Payer: Self-pay | Admitting: Vascular Surgery

## 2014-02-18 ENCOUNTER — Ambulatory Visit (HOSPITAL_COMMUNITY)
Admission: RE | Admit: 2014-02-18 | Discharge: 2014-02-18 | Disposition: A | Discharge status: Home / Self Care | Payer: Medicare HMO | Source: Ambulatory Visit | Attending: Vascular Surgery | Admitting: Vascular Surgery

## 2014-02-18 ENCOUNTER — Other Ambulatory Visit: Payer: Self-pay

## 2014-02-18 DIAGNOSIS — R0989 Other specified symptoms and signs involving the circulatory and respiratory systems: Secondary | ICD-10-CM

## 2014-02-18 DIAGNOSIS — R200 Anesthesia of skin: Secondary | ICD-10-CM

## 2014-02-18 DIAGNOSIS — I639 Cerebral infarction, unspecified: Secondary | ICD-10-CM

## 2014-02-18 DIAGNOSIS — R209 Unspecified disturbances of skin sensation: Secondary | ICD-10-CM

## 2014-02-22 ENCOUNTER — Ambulatory Visit
Admission: RE | Admit: 2014-02-22 | Discharge: 2014-02-22 | Disposition: A | Discharge status: Home / Self Care | Payer: Medicare HMO | Source: Ambulatory Visit | Attending: Vascular Surgery | Admitting: Vascular Surgery

## 2014-02-22 ENCOUNTER — Encounter: Payer: Self-pay | Admitting: Vascular Surgery

## 2014-02-22 DIAGNOSIS — R200 Anesthesia of skin: Secondary | ICD-10-CM

## 2014-02-22 DIAGNOSIS — I639 Cerebral infarction, unspecified: Secondary | ICD-10-CM

## 2014-02-23 ENCOUNTER — Encounter: Payer: Self-pay | Admitting: Vascular Surgery

## 2014-02-23 ENCOUNTER — Other Ambulatory Visit: Payer: Self-pay | Admitting: Vascular Surgery

## 2014-02-23 ENCOUNTER — Ambulatory Visit (INDEPENDENT_AMBULATORY_CARE_PROVIDER_SITE_OTHER): Payer: Medicare HMO | Admitting: Vascular Surgery

## 2014-02-23 ENCOUNTER — Telehealth: Payer: Self-pay | Admitting: Vascular Surgery

## 2014-02-23 VITALS — BP 119/89 | HR 84 | Ht <= 58 in | Wt 133.2 lb

## 2014-02-23 DIAGNOSIS — I6529 Occlusion and stenosis of unspecified carotid artery: Secondary | ICD-10-CM

## 2014-02-23 DIAGNOSIS — Z01810 Encounter for preprocedural cardiovascular examination: Secondary | ICD-10-CM

## 2014-02-23 LAB — BUN: BUN: 8 mg/dL (ref 6–23)

## 2014-02-23 LAB — CREATININE, SERUM: Creat: 0.64 mg/dL (ref 0.50–1.10)

## 2014-02-23 NOTE — Telephone Encounter (Signed)
notified patient of cta abd/pelvis at [facility] on 02-24-14 at 8:15, informed her that she cannot have anything to ear after midnight tonight liquids and meds are fine.

## 2014-02-23 NOTE — Progress Notes (Signed)
Patient name: Linda Kelly MRN: [id number] DOB: 1928-08-03 Sex: female   Referred by: Young  Reason for referral:  Chief Complaint  Patient presents with  . Carotid    new pt, hx of L CEA- c/o L hand numbness 3 weeks ago    HISTORY OF PRESENT ILLNESS: Patient has today for evaluation of carotid disease. She is a very complex 80 year old female. She was known to me from a prior left carotid surgery. She had a probable right brain TIA with left arm weakness one month ago. She has an extensive past history. She was initially treated by myself in 1995 with severe asymptomatic high-grade carotid stenosis with endarterectomy. She had Timothea Bodenheimer restenosis and underwent a redo carotid in 1997. She again had a recurrent stenosis and underwent endarterectomy with vein patch angioplasty in 1999. She had been followed in our office with a duplex up tilt 2009 showing no evidence of recurrent stenosis on the left and moderate to severe stenosis on the right. She was lost to followup in 2009. At that time she had normal triphasic brachial signals and normal pressures in both brachial arteries. She reports that several weeks ago she had an episode where her entire left arm felt. On further questioning she reports that she awoke with his feeling did feel like she had some weakness as well. She did not have any speech difficulties. She is left-handed. CT of her head several days ago showed no evidence of nerve that was some old changes of probable old strokes.  Past Medical History  Diagnosis Date  . PVD (peripheral vascular disease)   . Hyperlipidemia   . CAD (coronary artery disease)   . Tinnitus     chronic  . Internal hemorrhoid   . IBS (irritable bowel syndrome)   . High frequency hearing loss   . Chronic gastritis   . Benign neoplasm of esophagus   . Hiatal hernia   . Colon, diverticulosis   . Carotid artery disease   . Other nonthrombocytopenic purpuras   . Solitary pulmonary nodule   .  Idiopathic pulmonary fibrosis   . COPD (chronic obstructive pulmonary disease)   . Vertigo   . GERD (gastroesophageal reflux disease)   . Anxiety and depression   . MI (myocardial infarction) 1985  . Arthritis     Past Surgical History  Procedure Laterality Date  . Foot surgery      bilateral  . Toe surgery      right foot  . Carotid endarterectomy      left side x 3, saphenous vein graft from left leg  . Tonsillectomy    . Shoulder surgery      left  . Dental surgery      2012   . Angioplasty  1997, 98, 99  . Upper gastrointestinal endoscopy  11-27-01, 08-18-08    Hiatal hernia, benign esophagus neoplasia, gastritis, tortuous esophagus 54 Maloney dilation performed  . Colonoscopy  11-27-01, 10-30-05, 05-24-11    diverticulosis, hemorrhoids  . Colon surgery      History   Social History  . Marital Status: Widowed    Spouse Name: N/A    Number of Children: 6  . Years of Education: N/A   Occupational History  . 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     Social History Main Topics  . Smoking status: Current Some Day Smoker -- 0.50 packs/day for 60 years    Types: Cigarettes  . Smokeless tobacco:  Never Used  . Alcohol Use: No  . Drug Use: No  . Sexual Activity: Not on file   Other Topics Concern  . Not on file   Social History Narrative   [place] to [place] twice a year to visit sister- 3-4 weeks travel there every year.   Pt does not get regular exercise    Family History  Problem Relation Age of Onset  . Heart attack Father     deceased at 80, MGF  . Colon cancer Father   . Prostate cancer Father   . Heart disease Mother     deaceased at age 28  . Colitis Mother   . Stroke Mother   . Colitis Sister   . Other Brother     deceased age 31; war  . Breast cancer Maternal Aunt     great   . Lung cancer Paternal Aunt     great  . Vasculitis Son   . COPD Neg Hx   . Asthma Neg Hx     Allergies as of 02/23/2014  .  (No Known Allergies)    Current Outpatient Prescriptions on File Prior to Visit  Medication Sig Dispense Refill  . aspirin EC 81 MG tablet Take 81 mg by mouth daily.      . Biotin (BIOTIN 5000) 5 MG CAPS Take by mouth daily.      . cholecalciferol (VITAMIN D) 1000 UNITS tablet Take 1,000 Units by mouth daily.      . clopidogrel (PLAVIX) 75 MG tablet Take 1 tablet (75 mg total) by mouth daily.  90 tablet  1  . DiphenhydrAMINE HCl (ALLERGY MED PO) Take 180 mg by mouth. 1 by mouth daily (seasonal)      . folic acid (FOLVITE) 371 MCG tablet Take 400 mcg by mouth daily.        . Ibuprofen-Diphenhydramine Cit (ADVIL PM PO) Take 1 tablet by mouth daily. 1-2 tablets pm when needed for sleep      . Omega-3 Fatty Acids (FISH OIL) 1000 MG CAPS Take by mouth daily.      Marland Kitchen omeprazole (PRILOSEC OTC) 20 MG tablet Take 20 mg by mouth daily. X 1 month per Dr.Young      . ranitidine (ZANTAC) 150 MG tablet TAKE 1 TABLET BY MOUTH TWICE DAILY  180 tablet  1  . sertraline (ZOLOFT) 50 MG tablet TAKE 1 TABLET BY MOUTH ONCE DAILY  90 tablet  0  . simvastatin (ZOCOR) 40 MG tablet Take 40 mg by mouth daily.        . vitamin E 400 UNIT capsule Take 400 Units by mouth daily.       Current Facility-Administered Medications on File Prior to Visit  Medication Dose Route Frequency Provider Last Rate Last Dose  . 0.9 %  sodium chloride infusion  500 mL Intravenous Continuous Gatha Mayer, MD         REVIEW OF SYSTEMS:  Positives indicated with an "X"  CARDIOVASCULAR:  [ ]  chest pain   [ ]  chest pressure   [ ]  palpitations   [ ]  orthopnea   [ ]  dyspnea on exertion   [ ]  claudication   [ ]  rest pain   [ ]  DVT   [ ]  phlebitis PULMONARY:   [ ]  productive cough   [ ]  asthma   [ ]  wheezing NEUROLOGIC:   [x ] weakness  [ x] paresthesias  [ ]  aphasia  [ ]   amaurosis  [ ]  dizziness HEMATOLOGIC:   [ ]  bleeding problems   [ ]  clotting disorders MUSCULOSKELETAL:  [ ]  joint pain   [ ]  joint swelling GASTROINTESTINAL: [ ]    blood in stool  [ ]   hematemesis GENITOURINARY:  [ ]   dysuria  [ ]   hematuria PSYCHIATRIC:  [ ]  history of major depression INTEGUMENTARY:  [ ]  rashes  [ ]  ulcers CONSTITUTIONAL:  [ ]  fever   [ ]  chills  PHYSICAL EXAMINATION:  General: The patient is a well-nourished female, in no acute distress. Vital signs are BP 119/89  Pulse 84  Ht 4' 9.5" (1.461 m)  Wt 133 lb 3.2 oz (60.419 kg)  BMI 28.31 kg/m2  SpO2 99% Pulmonary: There is a good air exchange bilaterally without wheezing or rales. Abdomen: Soft and non-tender with normal pitch bowel sounds. Musculoskeletal: There are no major deformities.  There is no significant extremity pain. Neurologic: No focal weakness or paresthesias are detected, Skin: There are no ulcer or rashes noted. Psychiatric: The patient has normal affect. Cardiovascular: There is a regular rate and rhythm without significant murmur appreciated. Carotid artery well-healed left neck incision. He does have a right carotid bruit.   [facility] Vascular Lab Studies:  Ordered and Independently Reviewed duplex from 02/18/2014 of her carotid arteries revealed high-grade greater than 80% stenosis in the right internal carotid artery she has a moderate stenosis in the left and the 60-79% range. She does have retrograde flow in her left vertebral artery with monophasic flow in her brachial waveforms suggesting proximal subclavian occlusion and vertebral steal syndrome.  Impression and Plan:  Extremely complex patient. Recurrent carotid disease and now subclavian disease with vertebral steel syndrome. She does report several month history of unsteady gait as well. The event that she had with left arm weakness certainly sounds as though she had a right brain transient ischemic attack. Her carotid duplex suggested severe right carotid stenosis. Due to the extensive nature of her disease I have recommended CT angiogram for further evaluation of this. I explained this all likelihood  will suggest need for right carotid endarterectomy for reduction of stroke risk. I explained that this may improve flow to her brain in may help with her unsteady gait. As I would not recommend any consideration of correction of her subclavian stenosis currently and we'll make further recommendations based on her CT scan. She wished to proceed with the surgery following the scan if no other issues are discovered.    Arvilla Meres Charmion Hapke [facility] Office: [phone]

## 2014-02-24 ENCOUNTER — Ambulatory Visit
Admission: RE | Admit: 2014-02-24 | Discharge: 2014-02-24 | Disposition: A | Discharge status: Home / Self Care | Payer: Medicare HMO | Source: Ambulatory Visit | Attending: Vascular Surgery | Admitting: Vascular Surgery

## 2014-02-24 DIAGNOSIS — Z01810 Encounter for preprocedural cardiovascular examination: Secondary | ICD-10-CM

## 2014-02-24 DIAGNOSIS — I6529 Occlusion and stenosis of unspecified carotid artery: Secondary | ICD-10-CM

## 2014-02-24 MED ORDER — IOHEXOL 350 MG/ML SOLN
100.0000 mL | Freq: Once | INTRAVENOUS | Status: AC | PRN
  Administered 2014-02-24: 100 mL via INTRAVENOUS

## 2014-02-25 ENCOUNTER — Encounter (HOSPITAL_COMMUNITY): Payer: Self-pay | Admitting: Pharmacy Technician

## 2014-02-25 ENCOUNTER — Other Ambulatory Visit: Payer: Self-pay

## 2014-02-26 ENCOUNTER — Other Ambulatory Visit: Payer: Self-pay

## 2014-02-26 ENCOUNTER — Other Ambulatory Visit: Payer: Self-pay | Admitting: *Deleted

## 2014-02-26 ENCOUNTER — Encounter (HOSPITAL_COMMUNITY)
Admission: RE | Admit: 2014-02-26 | Discharge: 2014-02-26 | Disposition: A | Discharge status: Home / Self Care | Payer: Medicare HMO | Source: Ambulatory Visit | Attending: Vascular Surgery | Admitting: Vascular Surgery

## 2014-02-26 ENCOUNTER — Encounter (HOSPITAL_COMMUNITY): Payer: Self-pay

## 2014-02-26 DIAGNOSIS — Z01818 Encounter for other preprocedural examination: Secondary | ICD-10-CM | POA: Insufficient documentation

## 2014-02-26 DIAGNOSIS — Z01812 Encounter for preprocedural laboratory examination: Secondary | ICD-10-CM | POA: Insufficient documentation

## 2014-02-26 DIAGNOSIS — Z01810 Encounter for preprocedural cardiovascular examination: Secondary | ICD-10-CM

## 2014-02-26 DIAGNOSIS — N390 Urinary tract infection, site not specified: Secondary | ICD-10-CM

## 2014-02-26 HISTORY — DX: Other specified postprocedural states: Z98.890

## 2014-02-26 HISTORY — DX: Other specified postprocedural states: R11.2

## 2014-02-26 HISTORY — DX: Shortness of breath: R06.02

## 2014-02-26 HISTORY — DX: Anxiety disorder, unspecified: F41.9

## 2014-02-26 HISTORY — DX: Malignant (primary) neoplasm, unspecified: C80.1

## 2014-02-26 HISTORY — DX: Cerebral infarction, unspecified: I63.9

## 2014-02-26 LAB — CBC
HCT: 42.5 % (ref 36.0–46.0)
Hemoglobin: 14.3 g/dL (ref 12.0–15.0)
MCH: 31.8 pg (ref 26.0–34.0)
MCHC: 33.6 g/dL (ref 30.0–36.0)
MCV: 94.4 fL (ref 78.0–100.0)
Platelets: 232 10*3/uL (ref 150–400)
RBC: 4.5 MIL/uL (ref 3.87–5.11)
RDW: 13.9 % (ref 11.5–15.5)
WBC: 11.6 10*3/uL — AB (ref 4.0–10.5)

## 2014-02-26 LAB — COMPREHENSIVE METABOLIC PANEL
ALT: 10 U/L (ref 0–35)
AST: 20 U/L (ref 0–37)
Albumin: 3.8 g/dL (ref 3.5–5.2)
Alkaline Phosphatase: 99 U/L (ref 39–117)
BUN: 11 mg/dL (ref 6–23)
CALCIUM: 9.2 mg/dL (ref 8.4–10.5)
CO2: 27 meq/L (ref 19–32)
Chloride: 102 mEq/L (ref 96–112)
Creatinine, Ser: 0.75 mg/dL (ref 0.50–1.10)
GFR calc Af Amer: 88 mL/min — ABNORMAL LOW (ref >90)
GFR, EST NON AFRICAN AMERICAN: 76 mL/min — AB (ref >90)
Glucose, Bld: 90 mg/dL (ref 70–99)
Potassium: 4.4 mEq/L (ref 3.7–5.3)
SODIUM: 142 meq/L (ref 137–147)
Total Bilirubin: 0.4 mg/dL (ref 0.3–1.2)
Total Protein: 7.9 g/dL (ref 6.0–8.3)

## 2014-02-26 LAB — URINALYSIS, ROUTINE W REFLEX MICROSCOPIC
Bilirubin Urine: NEGATIVE
Glucose, UA: NEGATIVE mg/dL
Hgb urine dipstick: NEGATIVE
Ketones, ur: NEGATIVE mg/dL
NITRITE: NEGATIVE
PH: 6 (ref 5.0–8.0)
Protein, ur: NEGATIVE mg/dL
SPECIFIC GRAVITY, URINE: 1.021 (ref 1.005–1.030)
Urobilinogen, UA: 0.2 mg/dL (ref 0.0–1.0)

## 2014-02-26 LAB — ABO/RH: ABO/RH(D): O NEG

## 2014-02-26 LAB — URINE MICROSCOPIC-ADD ON

## 2014-02-26 LAB — APTT: APTT: 28 s (ref 24–37)

## 2014-02-26 LAB — SURGICAL PCR SCREEN
MRSA, PCR: NEGATIVE
STAPHYLOCOCCUS AUREUS: NEGATIVE

## 2014-02-26 LAB — PROTIME-INR
INR: 1.06 (ref 0.00–1.49)
PROTHROMBIN TIME: 13.6 s (ref 11.6–15.2)

## 2014-02-26 LAB — TYPE AND SCREEN
ABO/RH(D): O NEG
Antibody Screen: NEGATIVE

## 2014-02-26 MED ORDER — CIPROFLOXACIN HCL 500 MG PO TABS: 500.0000 mg | ORAL_TABLET | Freq: Two times a day (BID) | ORAL | Status: DC

## 2014-02-26 NOTE — Progress Notes (Addendum)
Anesthesia PAT Evaluation:  Patient is an 80 year old female scheduled for right carotid endarterectomy on 03/03/14 by Dr. Donnetta Hutching.  History includes idiopathic pulmonary fibrosis and COPD with history of ongoing tobacco use and history of dust exposure at [facility] collapse (sent from [facility] [employer] [facility] [facility]), PAD with history of left CEA '95 with redo in '97 and with vein patch angioplasty (left GSV) '99, hyperlipidemia, CAD/MI s/p angioplasty in 1985, tinnitus, IBS, high-frequency hearing loss, chronic gastritis, benign neoplasm of the esophagus, hiatal hernia, diverticulosis, nonthrombocytopenic pupuras, vertigo, GERD, anxiety and depression, arthritis.  Pulmonologist is Dr. Baird Lyons who referred patient to Dr. Donnetta Hutching following finding of new right carotid bruit with LUE numbness. PCP is Dr. Unice Cobble.  Her last cardiology note found was from Dr. Kirk Ruths on 08/10/10.  His note mentions that she had a history of MI in 03/1984 with cath at that time showing an occluded RCA s/p angioplasty with follow-up cath in 04/1984 showing hypo- to akinesis of the inferior wall and mitral valve prolapse. Echo on 03/15/03 showed normal LV function, mild MR. She denies any chest pain other than the occasional brief "lightening bolt" type shock that occurs about once a month and is no associated with nausea, diaphoresis, presyncope. She has not had any recurrent symptoms similar to when she had her MI. She does not require supplemental oxygen.  She is able to do water aerobics 3X/week and does all of her house cleaning.  She does not have LE edema.  Primarily there is no SOB at rest except on occasion if she turns in a particular position.  On exam, she is a pleasant Caucasian female in NAD.  Heart RRR, no murmur noted.  Lungs are course on inspiration particularly at bases.  No LE edema.  EKG on 02/26/14 showed: NSR, minimal voltage criteria for LVH, inferior infarct (old).    Nuclear stress test on 08/08/09  showed: Overall Impression: Scar and possible soft tissue attenuation in the inferior, inferolateral walls. Minimal change does not appear significant for ischemia. Apex with minimal improvement, does not appear signficant. EF 60%.  Carotid duplex on 02/18/14 showed: 80-99% right ICA stenosis, left ICA stenosis present with history of carotid endarterectomy at the proximal patch segment suggestive of 60-79% range. Bilateral external carotid artery stenosis present. Blood pressure gradient present with a left retrograde vertebral artery and monophasic left subclavian artery waveform suggestive of subclavian steal syndrome.  PFT 02/19/13- spirometry within normal limits lung volumes within normal limits. Severe decrease in diffusion capacity. Insignificant response to bronchodilator.FVC 1.60/86%, FEV1 1.37/116%, FEV1/FVC 0.86, FEF 25-75% 2.10/133%. TLC 94%, DLCO 43%.  CXR on 02/04/14 showed: COPD with scarring, small left pleural effusion.    Chest high resolution CT on 02/09/14 showed: 1. The appearance of the lungs is again compatible with underlying interstitial lung disease, and given the strong craniocaudal gradient, the presence of honeycombing, and evidence of progression compared to the prior study, features are compatible with usual interstitial pneumonia (UIP).  2. Atherosclerosis, including three-vessel coronary artery disease. In addition, the patient continues to have severe ectasia of the ascending thoracic aorta which is nearly aneurysmal, but unchanged in size (4.4 X 4.1 cm).  Preoperative labs noted. UA results called to [facility] at Dr. Luther Parody office.  Patient with pulmonary fibrosis with close pulmonary follow-up.  She feels at her baseline from a pulmonary standpoint.  Unfortunately she continues to smoke. She also has known CAD without cardiology follow-up in > 4 years.  She has been  overall asymptomatic and is able to tolerate water aerobics and house work. However, she is now in need of  vascular surgery for possible symptomatic disease.  Reviewed with anesthesiologist Dr. Ermalene Postin.  Recommend preoperative cardiology evaluation to determine if any further cardiac testing is indicated. Zigmund Daniel at Dr. Luther Parody office notified and arranged an appointment with Truitt Merle, NP on 03/02/14 at 11:45 AM.  Zigmund Daniel will also make sure patient is aware if she needs to hold her Plavix preoperatively.    George Hugh [facility] Stay [facility]/Anesthesiology Phone [phone] 02/26/2014 2:12 PM  Addendum: 03/01/14 4:15 PM  Patient was seen by Dr. Angelena Form today for preoperative cardiac clearance.  He felt she could proceed with surgery without additional preoperative testing.

## 2014-02-26 NOTE — Pre-Procedure Instructions (Addendum)
LATORI BEGGS  02/26/2014   Your procedure is scheduled on: Wednesday, Mar 03, 2014 at 8:30 AM  Report to [facility] Short Stay (use Main Entrance "A'') at 6:30 AM.  Call this number if you have problems the morning of surgery: [phone]   Remember:   Do not eat food or drink liquids after midnight.   Take these medicines the morning of surgery with A SIP OF WATER: if needed: loratadine (CLARITIN) for allergies Stop taking herbal medications ( Omega-3 Fatty Acids (FISH OIL), vitamin E 400 UNIT ). Do not take any NSAIDs ie: Ibuprofen ( Motrin) Advil, Naproxen ( Aleve).  Do not wear jewelry, make-up or nail polish.  Do not wear lotions, powders, or perfumes. You may wear deodorant.  Do not shave 48 hours prior to surgery.   Do not bring valuables to the hospital.  [facility] is not responsible for any belongings or valuables.               Contacts, dentures or bridgework may not be worn into surgery.  Leave suitcase in the car. After surgery it may be brought to your room.  For patients admitted to the hospital, discharge time is determined by your treatment team.               Patients discharged the day of surgery will not be allowed to drive home.  Name and phone number of your driver:  Special Instructions:  Special Instructions:Special Instructions: [facility] - Preparing for Surgery  Before surgery, you can play an important role.  Because skin is not sterile, your skin needs to be as free of germs as possible.  You can reduce the number of germs on you skin by washing with CHG (chlorahexidine gluconate) soap before surgery.  CHG is an antiseptic cleaner which kills germs and bonds with the skin to continue killing germs even after washing.  Please DO NOT use if you have an allergy to CHG or antibacterial soaps.  If your skin becomes reddened/irritated stop using the CHG and inform your nurse when you arrive at Short Stay.  Do not shave (including legs and underarms) for at least  48 hours prior to the first CHG shower.  You may shave your face.  Please follow these instructions carefully:   1.  Shower with CHG Soap the night before surgery and the morning of Surgery.  2.  If you choose to wash your hair, wash your hair first as usual with your normal shampoo.  3.  After you shampoo, rinse your hair and body thoroughly to remove the Shampoo.  4.  Use CHG as you would any other liquid soap.  You can apply chg directly  to the skin and wash gently with scrungie or a clean washcloth.  5.  Apply the CHG Soap to your body ONLY FROM THE NECK DOWN.  Do not use on open wounds or open sores.  Avoid contact with your eyes, ears, mouth and genitals (private parts).  Wash genitals (private parts) with your normal soap.  6.  Wash thoroughly, paying special attention to the area where your surgery will be performed.  7.  Thoroughly rinse your body with warm water from the neck down.  8.  DO NOT shower/wash with your normal soap after using and rinsing off the CHG Soap.  9.  Pat yourself dry with a clean towel.            10.  Wear clean pajamas.  11.  Place clean sheets on your bed the night of your first shower and do not sleep with pets.  Day of Surgery  Do not apply any lotions the morning of surgery.  Please wear clean clothes to the hospital/surgery center.   Please read over the following fact sheets that you were given: Pain Booklet, Coughing and Deep Breathing, Blood Transfusion Information, MRSA Information and Surgical Site Infection Prevention

## 2014-02-26 NOTE — Progress Notes (Unsigned)
Cipro 500mg  PO BID x7 days called to patient's pharmacy for abnormal urinalysis results. Patient made aware; verbalized understanding. Also, notified patient to take last dose of Plavix, Saturday 02/27/14, per Dr. Donnetta Hutching, prior to surgery on 03/03/14. Patient verbalized understanding.

## 2014-02-26 NOTE — Progress Notes (Signed)
Pt denies SOB, chest pain and being under the care of a cardiologist. According to pt, the last time that she was seen by a cardiologist was 2011 and that was Dr. Stanford Breed at [facility]. Pt denies having an EKG within the last year and denies having a cardiac cath. Pt stated that she takes all of her medications at night including her Aspirin. Spoke with Colletta Maryland at Dr. Luther Parody office regarding pt instructions on Plavix administration. According to [facility], she is waiting to hear from Dr. Donnetta Hutching and will follow up with pt regarding Plavix. Pt chart forwarded to [place], [place] ( anesthesia) regarding cardiac history, cxr and EKG.

## 2014-03-01 ENCOUNTER — Encounter: Payer: Self-pay | Admitting: Cardiovascular Disease

## 2014-03-01 ENCOUNTER — Ambulatory Visit (INDEPENDENT_AMBULATORY_CARE_PROVIDER_SITE_OTHER): Payer: Medicare HMO | Admitting: Cardiovascular Disease

## 2014-03-01 VITALS — BP 126/82 | HR 97 | Ht <= 58 in | Wt 133.4 lb

## 2014-03-01 DIAGNOSIS — E785 Hyperlipidemia, unspecified: Secondary | ICD-10-CM

## 2014-03-01 DIAGNOSIS — I739 Peripheral vascular disease, unspecified: Secondary | ICD-10-CM

## 2014-03-01 DIAGNOSIS — I10 Essential (primary) hypertension: Secondary | ICD-10-CM

## 2014-03-01 DIAGNOSIS — Z01810 Encounter for preprocedural cardiovascular examination: Secondary | ICD-10-CM

## 2014-03-01 DIAGNOSIS — Z720 Tobacco use: Secondary | ICD-10-CM

## 2014-03-01 DIAGNOSIS — I2510 Atherosclerotic heart disease of native coronary artery without angina pectoris: Secondary | ICD-10-CM

## 2014-03-01 DIAGNOSIS — F17200 Nicotine dependence, unspecified, uncomplicated: Secondary | ICD-10-CM

## 2014-03-01 NOTE — Patient Instructions (Signed)
Your physician wants you to follow-up in: 6 months with Dr.Crenshaw. You will receive a reminder letter in the mail two months in advance. If you don't receive a letter, please call our office to schedule the follow-up appointment.  

## 2014-03-01 NOTE — Progress Notes (Signed)
History of Present Illness: 80 yo female with history of carotid artery disease, CAD, HLD, pulmonary fibrosis who is here today for pre-operative cardiovascular risk assessment. Her cardiac issues have been followed by [facility] but it has been over 3 years since she has been seen in our office so she is added onto my schedule today as a new patient. She has been seen by Dr. Donnetta Hutching in [facility]. Recent carotid artery dopplers revealed high-grade greater than 80% stenosis in the right internal carotid artery, with moderate stenosis in the left in the 60-79% range. She does have retrograde flow in her left vertebral artery with monophasic flow in her brachial waveforms suggesting proximal subclavian occlusion and vertebral steal syndrome. CTA of the neck confirmed these findings. She will need right carotid endarterectomy later this week. As above, she has been followed by Dr. Stanford Breed.  Her cardiac issues date back to 33 when she had an MI. She was treated with a balloon angioplasty at that time. Repeat cath July 1985 with patent RCA. She has been stable since then from a cardiac standpoint. Last stress myoview in October of 2010 showed inferior and inferolateral infarct but no significant ischemia. Ejection fraction was 60%.   She is very active. She does water aerobics three days per week. She has no chest pain or SOB, LE edema, orthopnea or PND. No near syncope or syncope. No palpitations. She is still smoking 1/2 ppd.   Primary Care Physician: Unice Cobble  Past Medical History  Diagnosis Date  . PVD (peripheral vascular disease)   . Hyperlipidemia   . CAD (coronary artery disease)   . Tinnitus     chronic  . Internal hemorrhoid   . IBS (irritable bowel syndrome)   . High frequency hearing loss   . Chronic gastritis   . Benign neoplasm of esophagus   . Hiatal hernia   . Colon, diverticulosis   . Carotid artery disease   . Other nonthrombocytopenic purpuras   . Solitary pulmonary nodule    . Idiopathic pulmonary fibrosis   . COPD (chronic obstructive pulmonary disease)   . Vertigo   . GERD (gastroesophageal reflux disease)   . Anxiety and depression   . MI (myocardial infarction) 1985  . Arthritis   . PONV (postoperative nausea and vomiting)   . Shortness of breath     with exertion  . Stroke     Hx: of several mini -strokes  . Depression   . Anxiety   . Cancer     Skin cancer on back    Past Surgical History  Procedure Laterality Date  . Foot surgery      bilateral  . Toe surgery      right foot  . Carotid endarterectomy      left side x 3, saphenous vein graft from left leg  . Tonsillectomy    . Shoulder surgery      left  . Dental surgery      2012   . Angioplasty  1997, 98, 99  . Upper gastrointestinal endoscopy  11-27-01, 08-18-08    Hiatal hernia, benign esophagus neoplasia, gastritis, tortuous esophagus 91 Maloney dilation performed  . Colonoscopy  11-27-01, 10-30-05, 05-24-11    diverticulosis, hemorrhoids  . Colon surgery    . Cataract extraction w/ intraocular lens  implant, bilateral    . Breast surgery      Hx; of biopsy  . Tubal ligation      Current Outpatient Prescriptions  Medication Sig Dispense Refill  . aspirin EC 81 MG tablet Take 81 mg by mouth daily.      . cholecalciferol (VITAMIN D) 1000 UNITS tablet Take 1,000 Units by mouth daily.      . ciprofloxacin (CIPRO) 500 MG tablet Take 1 tablet (500 mg total) by mouth 2 (two) times daily.  14 tablet  0  . clopidogrel (PLAVIX) 75 MG tablet Take 1 tablet (75 mg total) by mouth daily.  90 tablet  1  . folic acid (FOLVITE) 732 MCG tablet Take 400 mcg by mouth daily.        Marland Kitchen loratadine (CLARITIN) 10 MG tablet Take 10 mg by mouth daily as needed for allergies.      . Omega-3 Fatty Acids (FISH OIL) 1000 MG CAPS Take 1,000 mg by mouth daily.       Marland Kitchen omeprazole (PRILOSEC) 20 MG capsule Take 20 mg by mouth daily.      . sertraline (ZOLOFT) 50 MG tablet Take 50 mg by mouth daily.      .  simvastatin (ZOCOR) 40 MG tablet Take 40 mg by mouth daily.        . vitamin E 400 UNIT capsule Take 400 Units by mouth daily.       Current Facility-Administered Medications  Medication Dose Route Frequency Provider Last Rate Last Dose  . 0.9 %  sodium chloride infusion  500 mL Intravenous Continuous Gatha Mayer, MD        No Known Allergies  History   Social History  . Marital Status: Widowed    Spouse Name: N/A    Number of Children: 6  . Years of Education: N/A   Occupational History  . 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     Social History Main Topics  . Smoking status: Current Some Day Smoker -- 0.50 packs/day for 60 years    Types: Cigarettes  . Smokeless tobacco: Never Used  . Alcohol Use: No  . Drug Use: No  . Sexual Activity: Not on file   Other Topics Concern  . Not on file   Social History Narrative   [place] to [place] twice a year to visit sister- 3-4 weeks travel there every year.   Pt does not get regular exercise    Family History  Problem Relation Age of Onset  . Heart attack Father     deceased at 14, MGF  . Colon cancer Father   . Prostate cancer Father   . Heart disease Mother     deaceased at age 40  . Colitis Mother   . Stroke Mother   . Colitis Sister   . Other Brother     deceased age 37; war  . Breast cancer Maternal Aunt     great   . Lung cancer Paternal Aunt     great  . Vasculitis Son   . COPD Neg Hx   . Asthma Neg Hx     Review of Systems:  As stated in the HPI and otherwise negative.   BP 126/82  Pulse 97  Ht 4\' 10"  (1.473 m)  Wt 133 lb 6.4 oz (60.51 kg)  BMI 27.89 kg/m2  SpO2 99%  Physical Examination: General: Well developed, well nourished, NAD HEENT: OP clear, mucus membranes moist SKIN: warm, dry. No rashes. Neuro: No focal deficits Musculoskeletal: Muscle strength 5/5 all ext Psychiatric: Mood and affect normal Neck: No JVD, left carotid  bruit, no thyromegaly,  no lymphadenopathy. Lungs:Clear bilaterally, no wheezes, rhonci, crackles Cardiovascular: Regular rate and rhythm. No murmurs, gallops or rubs. Abdomen:Soft. Bowel sounds present. Non-tender.  Extremities: No lower extremity edema. Pulses are 2 + in the bilateral DP/PT.  EKG: 02/26/14: NSR, old inferior infarct.   Assessment and Plan:   1. Pre-operative cardiovascular examination: She is very active. She has no recent angina or signs of CHF. She has known CAD but EKG unchanged. Would proceed with plans for carotid surgery without further cardiac workup.   2. CAD: Stable. Continue current meds.   3. PAD/Carotid artery disease: Followed by Dr. Donnetta Hutching. Plans for right carotid endarterectomy this week.   4. HTN: BP controlled. No changes  5. HLD: She is on a statin.   6. Tobacco abuse: She is still smoking 1/2 ppd. Cessation recommended.

## 2014-03-02 ENCOUNTER — Encounter (HOSPITAL_COMMUNITY): Payer: Self-pay | Admitting: Certified Registered Nurse Anesthetist

## 2014-03-02 ENCOUNTER — Ambulatory Visit: Payer: Self-pay | Admitting: Nurse Practitioner

## 2014-03-02 MED ORDER — CEFUROXIME SODIUM 1.5 G IJ SOLR
1.5000 g | INTRAMUSCULAR | Status: AC
  Administered 2014-03-03: 1.5 g via INTRAVENOUS
  Filled 2014-03-02: qty 1.5

## 2014-03-03 ENCOUNTER — Inpatient Hospital Stay (HOSPITAL_COMMUNITY)
Admission: RE | Admit: 2014-03-03 | Discharge: 2014-03-05 | DRG: 039 | Disposition: A | Discharge status: Home / Self Care | Payer: Medicare HMO | Source: Ambulatory Visit | Attending: Vascular Surgery | Admitting: Vascular Surgery

## 2014-03-03 ENCOUNTER — Encounter (HOSPITAL_COMMUNITY)
Admission: RE | Disposition: A | Discharge status: Home / Self Care | Payer: Self-pay | Source: Ambulatory Visit | Attending: Vascular Surgery

## 2014-03-03 ENCOUNTER — Encounter (HOSPITAL_COMMUNITY): Payer: Self-pay | Admitting: *Deleted

## 2014-03-03 ENCOUNTER — Encounter (HOSPITAL_COMMUNITY): Payer: Medicare HMO | Admitting: Vascular Surgery

## 2014-03-03 ENCOUNTER — Inpatient Hospital Stay (HOSPITAL_COMMUNITY): Payer: Medicare HMO | Admitting: Certified Registered Nurse Anesthetist

## 2014-03-03 DIAGNOSIS — K219 Gastro-esophageal reflux disease without esophagitis: Secondary | ICD-10-CM | POA: Diagnosis present

## 2014-03-03 DIAGNOSIS — F3289 Other specified depressive episodes: Secondary | ICD-10-CM | POA: Diagnosis present

## 2014-03-03 DIAGNOSIS — R12 Heartburn: Secondary | ICD-10-CM | POA: Diagnosis not present

## 2014-03-03 DIAGNOSIS — J4489 Other specified chronic obstructive pulmonary disease: Secondary | ICD-10-CM | POA: Diagnosis present

## 2014-03-03 DIAGNOSIS — Z86010 Personal history of colonic polyps: Secondary | ICD-10-CM

## 2014-03-03 DIAGNOSIS — H9319 Tinnitus, unspecified ear: Secondary | ICD-10-CM | POA: Diagnosis present

## 2014-03-03 DIAGNOSIS — R159 Full incontinence of feces: Secondary | ICD-10-CM

## 2014-03-03 DIAGNOSIS — I2510 Atherosclerotic heart disease of native coronary artery without angina pectoris: Secondary | ICD-10-CM | POA: Diagnosis present

## 2014-03-03 DIAGNOSIS — Z7982 Long term (current) use of aspirin: Secondary | ICD-10-CM

## 2014-03-03 DIAGNOSIS — F411 Generalized anxiety disorder: Secondary | ICD-10-CM | POA: Diagnosis present

## 2014-03-03 DIAGNOSIS — Z8673 Personal history of transient ischemic attack (TIA), and cerebral infarction without residual deficits: Secondary | ICD-10-CM

## 2014-03-03 DIAGNOSIS — J84112 Idiopathic pulmonary fibrosis: Secondary | ICD-10-CM | POA: Diagnosis present

## 2014-03-03 DIAGNOSIS — E785 Hyperlipidemia, unspecified: Secondary | ICD-10-CM | POA: Diagnosis present

## 2014-03-03 DIAGNOSIS — I6529 Occlusion and stenosis of unspecified carotid artery: Principal | ICD-10-CM | POA: Diagnosis present

## 2014-03-03 DIAGNOSIS — R198 Other specified symptoms and signs involving the digestive system and abdomen: Secondary | ICD-10-CM

## 2014-03-03 DIAGNOSIS — F329 Major depressive disorder, single episode, unspecified: Secondary | ICD-10-CM | POA: Diagnosis present

## 2014-03-03 DIAGNOSIS — I252 Old myocardial infarction: Secondary | ICD-10-CM

## 2014-03-03 DIAGNOSIS — R29898 Other symptoms and signs involving the musculoskeletal system: Secondary | ICD-10-CM | POA: Diagnosis present

## 2014-03-03 DIAGNOSIS — I739 Peripheral vascular disease, unspecified: Secondary | ICD-10-CM | POA: Diagnosis present

## 2014-03-03 DIAGNOSIS — M129 Arthropathy, unspecified: Secondary | ICD-10-CM | POA: Diagnosis present

## 2014-03-03 DIAGNOSIS — K589 Irritable bowel syndrome without diarrhea: Secondary | ICD-10-CM

## 2014-03-03 DIAGNOSIS — F17200 Nicotine dependence, unspecified, uncomplicated: Secondary | ICD-10-CM | POA: Diagnosis present

## 2014-03-03 DIAGNOSIS — J449 Chronic obstructive pulmonary disease, unspecified: Secondary | ICD-10-CM | POA: Diagnosis present

## 2014-03-03 HISTORY — PX: ENDARTERECTOMY: SHX5162

## 2014-03-03 LAB — CREATININE, SERUM
Creatinine, Ser: 0.76 mg/dL (ref 0.50–1.10)
GFR calc non Af Amer: 76 mL/min — ABNORMAL LOW (ref >90)
GFR, EST AFRICAN AMERICAN: 88 mL/min — AB (ref >90)

## 2014-03-03 LAB — CBC
HCT: 34.8 % — ABNORMAL LOW (ref 36.0–46.0)
HEMOGLOBIN: 11.5 g/dL — AB (ref 12.0–15.0)
MCH: 31.3 pg (ref 26.0–34.0)
MCHC: 33 g/dL (ref 30.0–36.0)
MCV: 94.8 fL (ref 78.0–100.0)
Platelets: 169 10*3/uL (ref 150–400)
RBC: 3.67 MIL/uL — ABNORMAL LOW (ref 3.87–5.11)
RDW: 14.1 % (ref 11.5–15.5)
WBC: 12.4 10*3/uL — ABNORMAL HIGH (ref 4.0–10.5)

## 2014-03-03 SURGERY — ENDARTERECTOMY, CAROTID
Anesthesia: General | Site: Neck | Laterality: Right

## 2014-03-03 MED ORDER — NEOSTIGMINE METHYLSULFATE 10 MG/10ML IV SOLN
INTRAVENOUS | Status: DC | PRN
Start: 2014-03-03 — End: 2014-03-03
  Administered 2014-03-03: 3 mg via INTRAVENOUS

## 2014-03-03 MED ORDER — LACTATED RINGERS IV SOLN
INTRAVENOUS | Status: DC | PRN
  Administered 2014-03-03 (×2): via INTRAVENOUS

## 2014-03-03 MED ORDER — HEPARIN SODIUM (PORCINE) 5000 UNIT/ML IJ SOLN
INTRAMUSCULAR | Status: DC | PRN
  Administered 2014-03-03: 09:00:00

## 2014-03-03 MED ORDER — SODIUM CHLORIDE 0.9 % IV SOLN: 500.0000 mL | Freq: Once | INTRAVENOUS | Status: AC | PRN

## 2014-03-03 MED ORDER — FENTANYL CITRATE 0.05 MG/ML IJ SOLN
INTRAMUSCULAR | Status: AC
  Filled 2014-03-03: qty 2

## 2014-03-03 MED ORDER — HYDRALAZINE HCL 20 MG/ML IJ SOLN: 10.0000 mg | INTRAMUSCULAR | Status: DC | PRN

## 2014-03-03 MED ORDER — PROPOFOL 10 MG/ML IV BOLUS
INTRAVENOUS | Status: DC | PRN
  Administered 2014-03-03: 20 mg via INTRAVENOUS
  Administered 2014-03-03: 140 mg via INTRAVENOUS
  Administered 2014-03-03: 30 mg via INTRAVENOUS

## 2014-03-03 MED ORDER — LORATADINE 10 MG PO TABS
10.0000 mg | ORAL_TABLET | Freq: Every day | ORAL | Status: DC | PRN
  Filled 2014-03-03: qty 1

## 2014-03-03 MED ORDER — CHLORHEXIDINE GLUCONATE 4 % EX LIQD
60.0000 mL | Freq: Once | CUTANEOUS | Status: DC
  Filled 2014-03-03: qty 60

## 2014-03-03 MED ORDER — POTASSIUM CHLORIDE CRYS ER 20 MEQ PO TBCR: 20.0000 meq | EXTENDED_RELEASE_TABLET | Freq: Every day | ORAL | Status: DC | PRN

## 2014-03-03 MED ORDER — ONDANSETRON HCL 4 MG/2ML IJ SOLN
4.0000 mg | Freq: Four times a day (QID) | INTRAMUSCULAR | Status: DC | PRN
  Administered 2014-03-04 (×2): 4 mg via INTRAVENOUS
  Filled 2014-03-03 (×2): qty 2

## 2014-03-03 MED ORDER — MORPHINE SULFATE 2 MG/ML IJ SOLN
2.0000 mg | INTRAMUSCULAR | Status: DC | PRN
  Administered 2014-03-03 – 2014-03-04 (×4): 2 mg via INTRAVENOUS
  Filled 2014-03-03 (×4): qty 1

## 2014-03-03 MED ORDER — DEXTROSE 5 % IV SOLN
1.5000 g | Freq: Two times a day (BID) | INTRAVENOUS | Status: AC
  Administered 2014-03-03 – 2014-03-04 (×2): 1.5 g via INTRAVENOUS
  Filled 2014-03-03 (×2): qty 1.5

## 2014-03-03 MED ORDER — PANTOPRAZOLE SODIUM 40 MG PO TBEC
40.0000 mg | DELAYED_RELEASE_TABLET | Freq: Every day | ORAL | Status: DC
  Administered 2014-03-04 – 2014-03-05 (×2): 40 mg via ORAL
  Filled 2014-03-03 (×2): qty 1

## 2014-03-03 MED ORDER — SERTRALINE HCL 50 MG PO TABS
50.0000 mg | ORAL_TABLET | Freq: Every day | ORAL | Status: DC
  Administered 2014-03-04 – 2014-03-05 (×2): 50 mg via ORAL
  Filled 2014-03-03 (×2): qty 1

## 2014-03-03 MED ORDER — PHENOL 1.4 % MT LIQD
1.0000 | OROMUCOSAL | Status: DC | PRN
  Administered 2014-03-03: 1 via OROMUCOSAL
  Filled 2014-03-03: qty 177

## 2014-03-03 MED ORDER — SODIUM CHLORIDE 0.9 % IV SOLN
INTRAVENOUS | Status: DC
  Administered 2014-03-03: 18:00:00 via INTRAVENOUS

## 2014-03-03 MED ORDER — SENNOSIDES-DOCUSATE SODIUM 8.6-50 MG PO TABS
1.0000 | ORAL_TABLET | Freq: Every evening | ORAL | Status: DC | PRN
  Filled 2014-03-03: qty 1

## 2014-03-03 MED ORDER — OXYCODONE-ACETAMINOPHEN 5-325 MG PO TABS
1.0000 | ORAL_TABLET | ORAL | Status: DC | PRN
  Administered 2014-03-05: 1 via ORAL
  Filled 2014-03-03: qty 1

## 2014-03-03 MED ORDER — SIMVASTATIN 40 MG PO TABS
40.0000 mg | ORAL_TABLET | Freq: Every day | ORAL | Status: DC
  Administered 2014-03-04: 40 mg via ORAL
  Filled 2014-03-03 (×2): qty 1

## 2014-03-03 MED ORDER — PROPOFOL 10 MG/ML IV BOLUS
INTRAVENOUS | Status: AC
  Filled 2014-03-03: qty 20

## 2014-03-03 MED ORDER — MAGNESIUM SULFATE 40 MG/ML IJ SOLN: 2.0000 g | Freq: Every day | INTRAMUSCULAR | Status: DC | PRN

## 2014-03-03 MED ORDER — METOPROLOL TARTRATE 1 MG/ML IV SOLN: 2.0000 mg | INTRAVENOUS | Status: DC | PRN

## 2014-03-03 MED ORDER — ONDANSETRON HCL 4 MG/2ML IJ SOLN
INTRAMUSCULAR | Status: DC | PRN
  Administered 2014-03-03: 4 mg via INTRAVENOUS

## 2014-03-03 MED ORDER — EPHEDRINE SULFATE 50 MG/ML IJ SOLN
INTRAMUSCULAR | Status: DC | PRN
  Administered 2014-03-03: 2.5 mg via INTRAVENOUS

## 2014-03-03 MED ORDER — BISACODYL 10 MG RE SUPP: 10.0000 mg | Freq: Every day | RECTAL | Status: DC | PRN

## 2014-03-03 MED ORDER — LIDOCAINE HCL (PF) 1 % IJ SOLN
INTRAMUSCULAR | Status: AC
  Filled 2014-03-03: qty 30

## 2014-03-03 MED ORDER — LIDOCAINE HCL (CARDIAC) 20 MG/ML IV SOLN
INTRAVENOUS | Status: AC
  Filled 2014-03-03: qty 5

## 2014-03-03 MED ORDER — LIDOCAINE HCL (CARDIAC) 20 MG/ML IV SOLN
INTRAVENOUS | Status: DC | PRN
  Administered 2014-03-03: 80 mg via INTRAVENOUS

## 2014-03-03 MED ORDER — ENOXAPARIN SODIUM 40 MG/0.4ML ~~LOC~~ SOLN
40.0000 mg | SUBCUTANEOUS | Status: DC
  Administered 2014-03-04 – 2014-03-05 (×2): 40 mg via SUBCUTANEOUS
  Filled 2014-03-03 (×3): qty 0.4

## 2014-03-03 MED ORDER — GLYCOPYRROLATE 0.2 MG/ML IJ SOLN
INTRAMUSCULAR | Status: DC | PRN
  Administered 2014-03-03: .4 mg via INTRAVENOUS

## 2014-03-03 MED ORDER — CIPROFLOXACIN HCL 500 MG PO TABS
500.0000 mg | ORAL_TABLET | Freq: Two times a day (BID) | ORAL | Status: DC
  Administered 2014-03-03 – 2014-03-05 (×4): 500 mg via ORAL
  Filled 2014-03-03 (×5): qty 1

## 2014-03-03 MED ORDER — ROCURONIUM BROMIDE 100 MG/10ML IV SOLN
INTRAVENOUS | Status: DC | PRN
  Administered 2014-03-03: 30 mg via INTRAVENOUS

## 2014-03-03 MED ORDER — ALUM & MAG HYDROXIDE-SIMETH 200-200-20 MG/5ML PO SUSP
15.0000 mL | ORAL | Status: DC | PRN
  Administered 2014-03-04 (×2): 30 mL via ORAL
  Filled 2014-03-03 (×2): qty 30

## 2014-03-03 MED ORDER — DOCUSATE SODIUM 100 MG PO CAPS
100.0000 mg | ORAL_CAPSULE | Freq: Every day | ORAL | Status: DC
  Administered 2014-03-04 – 2014-03-05 (×2): 100 mg via ORAL
  Filled 2014-03-03 (×2): qty 1

## 2014-03-03 MED ORDER — FENTANYL CITRATE 0.05 MG/ML IJ SOLN
INTRAMUSCULAR | Status: DC | PRN
  Administered 2014-03-03 (×2): 50 ug via INTRAVENOUS
  Administered 2014-03-03: 100 ug via INTRAVENOUS
  Administered 2014-03-03: 50 ug via INTRAVENOUS

## 2014-03-03 MED ORDER — ONDANSETRON HCL 4 MG/2ML IJ SOLN
INTRAMUSCULAR | Status: AC
  Filled 2014-03-03: qty 2

## 2014-03-03 MED ORDER — ACETAMINOPHEN 650 MG RE SUPP: 325.0000 mg | RECTAL | Status: DC | PRN

## 2014-03-03 MED ORDER — PHENYLEPHRINE HCL 10 MG/ML IJ SOLN
10.0000 mg | INTRAVENOUS | Status: DC | PRN
  Administered 2014-03-03: 15 ug/min via INTRAVENOUS

## 2014-03-03 MED ORDER — SODIUM CHLORIDE 0.9 % IV SOLN: INTRAVENOUS | Status: DC

## 2014-03-03 MED ORDER — GUAIFENESIN-DM 100-10 MG/5ML PO SYRP: 15.0000 mL | ORAL_SOLUTION | ORAL | Status: DC | PRN

## 2014-03-03 MED ORDER — ACETAMINOPHEN 325 MG PO TABS
325.0000 mg | ORAL_TABLET | ORAL | Status: DC | PRN
  Administered 2014-03-04: 650 mg via ORAL
  Filled 2014-03-03: qty 2

## 2014-03-03 MED ORDER — BIOTENE DRY MOUTH MT LIQD
15.0000 mL | Freq: Two times a day (BID) | OROMUCOSAL | Status: DC
  Administered 2014-03-04 – 2014-03-05 (×3): 15 mL via OROMUCOSAL

## 2014-03-03 MED ORDER — LABETALOL HCL 5 MG/ML IV SOLN: 10.0000 mg | INTRAVENOUS | Status: DC | PRN

## 2014-03-03 MED ORDER — DOPAMINE-DEXTROSE 3.2-5 MG/ML-% IV SOLN: 3.0000 ug/kg/min | INTRAVENOUS | Status: DC

## 2014-03-03 MED ORDER — 0.9 % SODIUM CHLORIDE (POUR BTL) OPTIME
TOPICAL | Status: DC | PRN
  Administered 2014-03-03: 2000 mL

## 2014-03-03 MED ORDER — PROTAMINE SULFATE 10 MG/ML IV SOLN
INTRAVENOUS | Status: DC | PRN
  Administered 2014-03-03: 40 mg via INTRAVENOUS
  Administered 2014-03-03: 10 mg via INTRAVENOUS

## 2014-03-03 MED ORDER — ASPIRIN EC 81 MG PO TBEC
81.0000 mg | DELAYED_RELEASE_TABLET | Freq: Every day | ORAL | Status: DC
  Administered 2014-03-04 – 2014-03-05 (×2): 81 mg via ORAL
  Filled 2014-03-03 (×2): qty 1

## 2014-03-03 MED ORDER — FENTANYL CITRATE 0.05 MG/ML IJ SOLN
INTRAMUSCULAR | Status: AC
  Filled 2014-03-03: qty 5

## 2014-03-03 MED ORDER — FENTANYL CITRATE 0.05 MG/ML IJ SOLN
25.0000 ug | INTRAMUSCULAR | Status: DC | PRN
  Administered 2014-03-03: 25 ug via INTRAVENOUS
  Administered 2014-03-03: 50 ug via INTRAVENOUS

## 2014-03-03 MED ORDER — CLOPIDOGREL BISULFATE 75 MG PO TABS
75.0000 mg | ORAL_TABLET | Freq: Every day | ORAL | Status: DC
  Administered 2014-03-04 – 2014-03-05 (×2): 75 mg via ORAL
  Filled 2014-03-03 (×2): qty 1

## 2014-03-03 MED ORDER — ROCURONIUM BROMIDE 50 MG/5ML IV SOLN
INTRAVENOUS | Status: AC
  Filled 2014-03-03: qty 1

## 2014-03-03 MED ORDER — HEPARIN SODIUM (PORCINE) 1000 UNIT/ML IJ SOLN
INTRAMUSCULAR | Status: DC | PRN
  Administered 2014-03-03: 6000 [IU] via INTRAVENOUS

## 2014-03-03 SURGICAL SUPPLY — 52 items
APL SKNCLS STERI-STRIP NONHPOA (GAUZE/BANDAGES/DRESSINGS) ×1
BENZOIN TINCTURE PRP APPL 2/3 (GAUZE/BANDAGES/DRESSINGS) ×3 IMPLANT
BLADE 10 SAFETY STRL DISP (BLADE) ×3 IMPLANT
CANISTER SUCTION 2500CC (MISCELLANEOUS) ×3 IMPLANT
CATH ROBINSON RED A/P 18FR (CATHETERS) ×3 IMPLANT
CLIP LIGATING EXTRA MED SLVR (CLIP) ×3 IMPLANT
CLIP LIGATING EXTRA SM BLUE (MISCELLANEOUS) ×3 IMPLANT
CLOSURE STERI-STRIP 1/2X4 (GAUZE/BANDAGES/DRESSINGS) ×1
CLOSURE WOUND 1/2 X4 (GAUZE/BANDAGES/DRESSINGS) ×1
CLSR STERI-STRIP ANTIMIC 1/2X4 (GAUZE/BANDAGES/DRESSINGS) ×1 IMPLANT
COVER SURGICAL LIGHT HANDLE (MISCELLANEOUS) ×3 IMPLANT
CRADLE DONUT ADULT HEAD (MISCELLANEOUS) ×3 IMPLANT
DECANTER SPIKE VIAL GLASS SM (MISCELLANEOUS) IMPLANT
DRAIN HEMOVAC 1/8 X 5 (WOUND CARE) IMPLANT
DRAPE WARM FLUID 44X44 (DRAPE) ×3 IMPLANT
DRSG COVADERM 4X8 (GAUZE/BANDAGES/DRESSINGS) ×2 IMPLANT
ELECT REM PT RETURN 9FT ADLT (ELECTROSURGICAL) ×3
ELECTRODE REM PT RTRN 9FT ADLT (ELECTROSURGICAL) ×1 IMPLANT
EVACUATOR SILICONE 100CC (DRAIN) IMPLANT
GEL ULTRASOUND 20GR AQUASONIC (MISCELLANEOUS) IMPLANT
GLOVE BIO SURGEON STRL SZ7.5 (GLOVE) ×6 IMPLANT
GLOVE BIOGEL PI IND STRL 7.5 (GLOVE) IMPLANT
GLOVE BIOGEL PI IND STRL 8 (GLOVE) IMPLANT
GLOVE BIOGEL PI INDICATOR 7.5 (GLOVE) ×2
GLOVE BIOGEL PI INDICATOR 8 (GLOVE) ×2
GLOVE SS BIOGEL STRL SZ 7 (GLOVE) IMPLANT
GLOVE SS BIOGEL STRL SZ 7.5 (GLOVE) ×1 IMPLANT
GLOVE SUPERSENSE BIOGEL SZ 7 (GLOVE) ×2
GLOVE SUPERSENSE BIOGEL SZ 7.5 (GLOVE) ×2
GOWN STRL REUS W/ TWL LRG LVL3 (GOWN DISPOSABLE) ×3 IMPLANT
GOWN STRL REUS W/TWL LRG LVL3 (GOWN DISPOSABLE) ×9
KIT BASIN OR (CUSTOM PROCEDURE TRAY) ×3 IMPLANT
KIT ROOM TURNOVER OR (KITS) ×3 IMPLANT
NEEDLE 22X1 1/2 (OR ONLY) (NEEDLE) IMPLANT
NS IRRIG 1000ML POUR BTL (IV SOLUTION) ×6 IMPLANT
PACK CAROTID (CUSTOM PROCEDURE TRAY) ×3 IMPLANT
PAD ARMBOARD 7.5X6 YLW CONV (MISCELLANEOUS) ×6 IMPLANT
PATCH HEMASHIELD 8X75 (Vascular Products) ×2 IMPLANT
SHUNT CAROTID BYPASS 10 (VASCULAR PRODUCTS) ×2 IMPLANT
SHUNT CAROTID BYPASS 12FRX15.5 (VASCULAR PRODUCTS) IMPLANT
STRIP CLOSURE SKIN 1/2X4 (GAUZE/BANDAGES/DRESSINGS) ×2 IMPLANT
SUT ETHILON 3 0 PS 1 (SUTURE) IMPLANT
SUT PROLENE 6 0 CC (SUTURE) ×3 IMPLANT
SUT SILK 3 0 (SUTURE) ×3
SUT SILK 3-0 18XBRD TIE 12 (SUTURE) IMPLANT
SUT VIC AB 3-0 SH 27 (SUTURE) ×3
SUT VIC AB 3-0 SH 27X BRD (SUTURE) ×2 IMPLANT
SUT VICRYL 4-0 PS2 18IN ABS (SUTURE) ×3 IMPLANT
SYR CONTROL 10ML LL (SYRINGE) IMPLANT
TOWEL OR 17X24 6PK STRL BLUE (TOWEL DISPOSABLE) ×3 IMPLANT
TOWEL OR 17X26 10 PK STRL BLUE (TOWEL DISPOSABLE) ×3 IMPLANT
WATER STERILE IRR 1000ML POUR (IV SOLUTION) ×3 IMPLANT

## 2014-03-03 NOTE — Anesthesia Preprocedure Evaluation (Addendum)
Anesthesia Evaluation  Patient identified by MRN, date of birth, ID band Patient awake    Reviewed: Allergy & Precautions, H&P , NPO status , Patient's Chart, lab work & pertinent test results  History of Anesthesia Complications (+) PONV and history of anesthetic complications  Airway Mallampati: II TM Distance: >3 FB Neck ROM: Full    Dental  (+) Partial Lower, Edentulous Upper   Pulmonary pneumonia -, COPDCurrent Smoker,  Patient with pulmonary fibrosis with close pulmonary follow-up. breath sounds clear to auscultation        Cardiovascular hypertension, Pt. on medications + CAD, + Past MI and + Peripheral Vascular Disease Rhythm:Regular Rate:Normal  Nuclear stress test on 08/08/09 showed: Overall Impression: Scar and possible soft tissue attenuation in the inferior, inferolateral walls. Minimal change does not appear significant for ischemia. Apex with minimal improvement, does not appear signficant. EF 60%.      Neuro/Psych  Headaches, PSYCHIATRIC DISORDERS Anxiety Depression TIA   GI/Hepatic Neg liver ROS, hiatal hernia, GERD-  Medicated,  Endo/Other  negative endocrine ROS  Renal/GU negative Renal ROS     Musculoskeletal   Abdominal (+)  Abdomen: soft.    Peds  Hematology negative hematology ROS (+)   Anesthesia Other Findings   Reproductive/Obstetrics                      Anesthesia Physical Anesthesia Plan  ASA: III  Anesthesia Plan: General   Post-op Pain Management:    Induction: Intravenous  Airway Management Planned: Oral ETT  Additional Equipment: Arterial line  Intra-op Plan:   Post-operative Plan: Extubation in OR  Informed Consent: I have reviewed the patients History and Physical, chart, labs and discussed the procedure including the risks, benefits and alternatives for the proposed anesthesia with the patient or authorized representative who has indicated his/her  understanding and acceptance.   Dental advisory given  Plan Discussed with: CRNA, Anesthesiologist and Surgeon  Anesthesia Plan Comments:       Anesthesia Quick Evaluation

## 2014-03-03 NOTE — H&P (View-Only) (Signed)
   Patient name: Linda Kelly MRN: [id number] DOB: 11/19/1927 Sex: female   Referred by: Young  Reason for referral:  Chief Complaint  Patient presents with  . Carotid    new pt, hx of L CEA- c/o L hand numbness 3 weeks ago    HISTORY OF PRESENT ILLNESS: Patient has today for evaluation of carotid disease. She is a very complex 80-year-old female. She was known to me from a prior left carotid surgery. She had a probable right brain TIA with left arm weakness one month ago. She has an extensive past history. She was initially treated by myself in 1995 with severe asymptomatic high-grade carotid stenosis with endarterectomy. She had Delshon Blanchfield restenosis and underwent a redo carotid in 1997. She again had a recurrent stenosis and underwent endarterectomy with vein patch angioplasty in 1999. She had been followed in our office with a duplex up tilt 2009 showing no evidence of recurrent stenosis on the left and moderate to severe stenosis on the right. She was lost to followup in 2009. At that time she had normal triphasic brachial signals and normal pressures in both brachial arteries. She reports that several weeks ago she had an episode where her entire left arm felt. On further questioning she reports that she awoke with his feeling did feel like she had some weakness as well. She did not have any speech difficulties. She is left-handed. CT of her head several days ago showed no evidence of nerve that was some old changes of probable old strokes.  Past Medical History  Diagnosis Date  . PVD (peripheral vascular disease)   . Hyperlipidemia   . CAD (coronary artery disease)   . Tinnitus     chronic  . Internal hemorrhoid   . IBS (irritable bowel syndrome)   . High frequency hearing loss   . Chronic gastritis   . Benign neoplasm of esophagus   . Hiatal hernia   . Colon, diverticulosis   . Carotid artery disease   . Other nonthrombocytopenic purpuras   . Solitary pulmonary nodule   .  Idiopathic pulmonary fibrosis   . COPD (chronic obstructive pulmonary disease)   . Vertigo   . GERD (gastroesophageal reflux disease)   . Anxiety and depression   . MI (myocardial infarction) 1985  . Arthritis     Past Surgical History  Procedure Laterality Date  . Foot surgery      bilateral  . Toe surgery      right foot  . Carotid endarterectomy      left side x 3, saphenous vein graft from left leg  . Tonsillectomy    . Shoulder surgery      left  . Dental surgery      2012   . Angioplasty  1997, 98, 99  . Upper gastrointestinal endoscopy  11-27-01, 08-18-08    Hiatal hernia, benign esophagus neoplasia, gastritis, tortuous esophagus 54 Maloney dilation performed  . Colonoscopy  11-27-01, 10-30-05, 05-24-11    diverticulosis, hemorrhoids  . Colon surgery      History   Social History  . Marital Status: Widowed    Spouse Name: N/A    Number of Children: 6  . Years of Education: N/A   Occupational History  . [employer] Volunteer at [employer]   .     Social History Main Topics  . Smoking status: Current Some Day Smoker -- 0.50 packs/day for 60 years    Types: Cigarettes  . Smokeless tobacco:   Never Used  . Alcohol Use: No  . Drug Use: No  . Sexual Activity: Not on file   Other Topics Concern  . 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   Pt does not get regular exercise    Family History  Problem Relation Age of Onset  . Heart attack Father     deceased at 78, MGF  . Colon cancer Father   . Prostate cancer Father   . Heart disease Mother     deaceased at age 78  . Colitis Mother   . Stroke Mother   . Colitis Sister   . Other Brother     deceased age 23; war  . Breast cancer Maternal Aunt     great   . Lung cancer Paternal Aunt     great  . Vasculitis Son   . COPD Neg Hx   . Asthma Neg Hx     Allergies as of 02/23/2014  .  (No Known Allergies)    Current Outpatient Prescriptions on File Prior to Visit  Medication Sig Dispense Refill  . aspirin EC 81 MG tablet Take 81 mg by mouth daily.      . Biotin (BIOTIN 5000) 5 MG CAPS Take by mouth daily.      . cholecalciferol (VITAMIN D) 1000 UNITS tablet Take 1,000 Units by mouth daily.      . clopidogrel (PLAVIX) 75 MG tablet Take 1 tablet (75 mg total) by mouth daily.  90 tablet  1  . DiphenhydrAMINE HCl (ALLERGY MED PO) Take 180 mg by mouth. 1 by mouth daily (seasonal)      . folic acid (FOLVITE) 400 MCG tablet Take 400 mcg by mouth daily.        . Ibuprofen-Diphenhydramine Cit (ADVIL PM PO) Take 1 tablet by mouth daily. 1-2 tablets pm when needed for sleep      . Omega-3 Fatty Acids (FISH OIL) 1000 MG CAPS Take by mouth daily.      . omeprazole (PRILOSEC OTC) 20 MG tablet Take 20 mg by mouth daily. X 1 month per Dr.Young      . ranitidine (ZANTAC) 150 MG tablet TAKE 1 TABLET BY MOUTH TWICE DAILY  180 tablet  1  . sertraline (ZOLOFT) 50 MG tablet TAKE 1 TABLET BY MOUTH ONCE DAILY  90 tablet  0  . simvastatin (ZOCOR) 40 MG tablet Take 40 mg by mouth daily.        . vitamin E 400 UNIT capsule Take 400 Units by mouth daily.       Current Facility-Administered Medications on File Prior to Visit  Medication Dose Route Frequency Provider Last Rate Last Dose  . 0.9 %  sodium chloride infusion  500 mL Intravenous Continuous Carl E Gessner, MD         REVIEW OF SYSTEMS:  Positives indicated with an "X"  CARDIOVASCULAR:  [ ] chest pain   [ ] chest pressure   [ ] palpitations   [ ] orthopnea   [ ] dyspnea on exertion   [ ] claudication   [ ] rest pain   [ ] DVT   [ ] phlebitis PULMONARY:   [ ] productive cough   [ ] asthma   [ ] wheezing NEUROLOGIC:   [x ] weakness  [ x] paresthesias  [ ] aphasia  [ ]   amaurosis  [ ]  dizziness HEMATOLOGIC:   [ ]  bleeding problems   [ ]  clotting disorders MUSCULOSKELETAL:  [ ]  joint pain   [ ]  joint swelling GASTROINTESTINAL: [ ]    blood in stool  [ ]   hematemesis GENITOURINARY:  [ ]   dysuria  [ ]   hematuria PSYCHIATRIC:  [ ]  history of major depression INTEGUMENTARY:  [ ]  rashes  [ ]  ulcers CONSTITUTIONAL:  [ ]  fever   [ ]  chills  PHYSICAL EXAMINATION:  General: The patient is a well-nourished female, in no acute distress. Vital signs are BP 119/89  Pulse 84  Ht 4' 9.5" (1.461 m)  Wt 133 lb 3.2 oz (60.419 kg)  BMI 28.31 kg/m2  SpO2 99% Pulmonary: There is a good air exchange bilaterally without wheezing or rales. Abdomen: Soft and non-tender with normal pitch bowel sounds. Musculoskeletal: There are no major deformities.  There is no significant extremity pain. Neurologic: No focal weakness or paresthesias are detected, Skin: There are no ulcer or rashes noted. Psychiatric: The patient has normal affect. Cardiovascular: There is a regular rate and rhythm without significant murmur appreciated. Carotid artery well-healed left neck incision. He does have a right carotid bruit.   [facility] Vascular Lab Studies:  Ordered and Independently Reviewed duplex from 02/18/2014 of her carotid arteries revealed high-grade greater than 80% stenosis in the right internal carotid artery she has a moderate stenosis in the left and the 60-79% range. She does have retrograde flow in her left vertebral artery with monophasic flow in her brachial waveforms suggesting proximal subclavian occlusion and vertebral steal syndrome.  Impression and Plan:  Extremely complex patient. Recurrent carotid disease and now subclavian disease with vertebral steel syndrome. She does report several month history of unsteady gait as well. The event that she had with left arm weakness certainly sounds as though she had a right brain transient ischemic attack. Her carotid duplex suggested severe right carotid stenosis. Due to the extensive nature of her disease I have recommended CT angiogram for further evaluation of this. I explained this all likelihood  will suggest need for right carotid endarterectomy for reduction of stroke risk. I explained that this may improve flow to her brain in may help with her unsteady gait. As I would not recommend any consideration of correction of her subclavian stenosis currently and we'll make further recommendations based on her CT scan. She wished to proceed with the surgery following the scan if no other issues are discovered.    Linda Kelly [facility] Office: [phone]

## 2014-03-03 NOTE — Op Note (Signed)
     Patient name: Linda Kelly MRN: [id number] DOB: 11-10-1927 Sex: female  03/03/2014 Pre-operative Diagnosis: Symptomatic right carotid stenosis Post-operative diagnosis:  Same Surgeon:  Rosetta Posner, M.D. Assistants:  Fields Procedure:    right carotid Endarterectomy with Dacron patch angioplasty Anesthesia:  General Blood Loss:  See anesthesia record Specimens:  none  Indications for surgery:  Right brain TIA  Procedure in detail:  The patient was taken to the operating and placed in the supine position. The neck was prepped and draped in the usual sterile fashion. An incision was made anterior to the sternocleidomastoid muscle and continued with electrocautery through the platysma muscle. The muscle was retracted posteriorly and the carotid sheath was opened. The facial vein was ligated with 2-0 silk ties and divided. The common carotid artery was encircled with an umbilical tape and Rummel tourniquet. Dissection was continued onto the carotid bifurcation. The superior thyroid artery was controlled with a 2-0 silk Potts tie. The external carotid organ was encircled with a vessel loop and the internal carotid was encircled with umbilical tape and Rummel tourniquet. The hypoglossal and vagus nerves were identified and preserved.  The patient was given systemic heparinization. After adequate circulation time, the internal,external and common carotid arteries were occluded. The common carotid was opened with an 11 blade and the arteriotomy was continued with Potts scissors onto the internal carotid artery. A 10 shunt was passed up the internal carotid artery, allowed to back bleed, and then passed down the common carotid artery. The shunt was secured with Rummel tourniquet. The endarterectomy was begun on the common carotid artery  plaque was divided proximally with Potts scissors. The endarterectomy was continued onto the carotid bifurcation. The external carotid was endarterectomized by eversion  technique and the internal carotid artery was endarterectomized in an open fashion. Remaining debris was removed from the endarterectomy plane. A Dacron patch was brought to the field and sewn as a patch angioplasty. Prior to completing the anastomosis, the shunt was removed and the usual flushing maneuvers were undertaken. The anastomosis was then completed and flow was restored first to the external and then the internal carotid artery. Excellent flow characteristics were noted with hand-held Doppler in the internal and external carotid arteries.  The patient was given protamine to reverse the heparin. Hemostasis was obtained with electrocautery. The wounds were irrigated with saline. The wound was closed by first reapproximating the sternocleidomastoid muscle over the carotid artery with interrupted 3-0 Vicryl sutures. Next, the platysma was closed with a running 3-0 Vicryl suture. The skin was closed with a 4-0 subcuticular Vicryl suture. Benzoin and Steri-Strips were applied to the incision. A sterile dressing was placed over the incision. All sponge and needle counts were correct. The patient was awakened in the operating room, neurologically intact. They were transferred to the PACU in stable condition.  Carotid stenosis at surgery:75%  Disposition:  To PACU in stable condition,neurologically intact  Relevant Operative Details:  Rotated internal carotid   Rosetta Posner, M.D. [facility] Office: [phone] Pager:  [phone]

## 2014-03-03 NOTE — Anesthesia Postprocedure Evaluation (Signed)
  Anesthesia Post-op Note  Patient: Linda Kelly  Procedure(s) Performed: Procedure(s): RIGHT CAROTID ENDARTERECTOMY WITH PATCH ANGIOPLASTY (Right)  Patient Location: PACU  Anesthesia Type:General  Level of Consciousness: awake and sedated  Airway and Oxygen Therapy: Patient Spontanous Breathing  Post-op Pain: none  Post-op Assessment: Post-op Vital signs reviewed, Patient's Cardiovascular Status Stable and Respiratory Function Stable  Post-op Vital Signs: stable  Last Vitals:  Filed Vitals:   03/03/14 1308  BP:   Pulse: 71  Temp:   Resp: 13    Complications: No apparent anesthesia complications

## 2014-03-03 NOTE — Interval H&P Note (Signed)
History and Physical Interval Note:  03/03/2014 8:14 AM  Linda Kelly  has presented today for surgery, with the diagnosis of Right carotid artery stenosis  The various methods of treatment have been discussed with the patient and family. After consideration of risks, benefits and other options for treatment, the patient has consented to  Procedure(s): ENDARTERECTOMY CAROTID (Right) as a surgical intervention .  The patient's history has been reviewed, patient examined, no change in status, stable for surgery.  I have reviewed the patient's chart and labs.  Questions were answered to the patient's satisfaction.     Arvilla Meres Early

## 2014-03-03 NOTE — Transfer of Care (Signed)
Immediate Anesthesia Transfer of Care Note  Patient: [name]  Procedure(s) Performed: Procedure(s): RIGHT CAROTID ENDARTERECTOMY WITH PATCH ANGIOPLASTY (Right)  Patient Location: PACU  Anesthesia Type:General  Level of Consciousness: awake, alert  and oriented  Airway & Oxygen Therapy: Patient Spontanous Breathing  Post-op Assessment: Report given to PACU RN  Post vital signs: Reviewed and stable  Complications: No apparent anesthesia complications

## 2014-03-04 LAB — CBC
HEMATOCRIT: 34 % — AB (ref 36.0–46.0)
Hemoglobin: 11.1 g/dL — ABNORMAL LOW (ref 12.0–15.0)
MCH: 31 pg (ref 26.0–34.0)
MCHC: 32.6 g/dL (ref 30.0–36.0)
MCV: 95 fL (ref 78.0–100.0)
Platelets: 172 10*3/uL (ref 150–400)
RBC: 3.58 MIL/uL — AB (ref 3.87–5.11)
RDW: 14.2 % (ref 11.5–15.5)
WBC: 11.7 10*3/uL — ABNORMAL HIGH (ref 4.0–10.5)

## 2014-03-04 LAB — BASIC METABOLIC PANEL
BUN: 10 mg/dL (ref 6–23)
CHLORIDE: 103 meq/L (ref 96–112)
CO2: 26 meq/L (ref 19–32)
Calcium: 7.8 mg/dL — ABNORMAL LOW (ref 8.4–10.5)
Creatinine, Ser: 0.64 mg/dL (ref 0.50–1.10)
GFR calc Af Amer: >90 mL/min (ref >90)
GFR, EST NON AFRICAN AMERICAN: 80 mL/min — AB (ref >90)
GLUCOSE: 101 mg/dL — AB (ref 70–99)
Potassium: 4 mEq/L (ref 3.7–5.3)
SODIUM: 140 meq/L (ref 137–147)

## 2014-03-04 MED ORDER — OXYCODONE HCL 5 MG PO TABS: 5.0000 mg | ORAL_TABLET | Freq: Four times a day (QID) | ORAL | Status: DC | PRN

## 2014-03-04 NOTE — Progress Notes (Signed)
Utilization review completed.  

## 2014-03-04 NOTE — Progress Notes (Signed)
Subjective: Interval History: none..   Objective: Vital signs in last 24 hours: Temp:  [97.7 F (36.5 C)-98.6 F (37 C)] 98.6 F (37 C) (05/07 0419) Pulse Rate:  [65-94] 80 (05/07 0419) Resp:  [13-22] 20 (05/07 0419) BP: (67-154)/(36-87) 137/64 mmHg (05/07 0419) SpO2:  [92 %-100 %] 96 % (05/07 0419) Arterial Line BP: (100-162)/(46-70) 162/70 mmHg (05/07 0419) Weight:  [133 lb 6.1 oz (60.5 kg)] 133 lb 6.1 oz (60.5 kg) (05/06 1700)  Intake/Output from previous day: 05/06 0701 - 05/07 0700 In: 1996.7 [P.O.:120; I.V.:1826.7; IV Piggyback:50] Out: 750 [Urine:750] Intake/Output this shift: Total I/O In: 575 [I.V.:525; IV Piggyback:50] Out: 500 [Urine:500]  Neck dressing removed. No hematoma. The incision healing nicely. Neurologically she is grossly intact.  Lab Results:  Recent Labs  03/03/14 1818 03/04/14 0415  WBC 12.4* 11.7*  HGB 11.5* 11.1*  HCT 34.8* 34.0*  PLT 169 172   BMET  Recent Labs  03/03/14 1818 03/04/14 0415  NA  --  140  K  --  4.0  CL  --  103  CO2  --  26  GLUCOSE  --  101*  BUN  --  10  CREATININE 0.76 0.64  CALCIUM  --  7.8*    Studies/Results: Dg Chest 2 View  02/04/2014   CLINICAL DATA:  Physical exam  EXAM: CHEST  2 VIEW  COMPARISON:  09/24/2012 .  FINDINGS: COPD with pulmonary hyperinflation and pulmonary scarring. Improved aeration in the lung bases since the prior study. Small pleural effusion or pleural thickening is present. Negative for heart failure or pneumonia  IMPRESSION: COPD with scarring.  Small left pleural effusion.   Electronically Signed   By: Franchot Gallo M.D.   On: 02/04/2014 09:40   Ct Head Wo Contrast  02/22/2014   CLINICAL DATA:  Two week history of left upper extremity numbness  EXAM: CT HEAD WITHOUT CONTRAST  TECHNIQUE: Contiguous axial images were obtained from the base of the skull through the vertex without intravenous contrast.  COMPARISON:  Mar 23, 2007  FINDINGS: The ventricles are normal in size and  configuration. There is bilateral frontal atrophy bilaterally, stable.  There is no mass, hemorrhage, extra-axial fluid collection, or midline shift. There is evidence of a prior small lacunar type infarct in the anterior limb of the right internal capsule. There is evidence of a prior small lacunar type infarct in the head of the caudate nucleus on the right. There is mild patchy small vessel disease in the centra semiovale bilaterally, slightly more on the right than on the left. There is no new gray-white compartment lesion. No demonstrable acute infarct. Minimal basal ganglia calcification may be physiologic.  Bony calvarium appears intact. The mastoid air cells are clear. There is calcification in both vertebral arteries.  IMPRESSION: Bilateral frontal atrophy, stable. Ventricles appear normal in size and configuration. There is evidence of a prior small infarct in the right internal capsule, anterior limb. There is also a prior small infarct in the head of the caudate nucleus on the right. There is stable periventricular small vessel disease. No intracranial mass, hemorrhage, or acute appearing infarct. Stable vertebral artery calcification.   Electronically Signed   By: Lowella Grip M.D.   On: 02/22/2014 16:04   Ct Angio Neck W/cm &/or Wo/cm  02/24/2014   CLINICAL DATA:  80 year old female with carotid stenosis undergoing preoperative evaluation. Left upper extremity numbness. Initial encounter. Prior left carotid surgery.  EXAM: CT ANGIOGRAPHY NECK  TECHNIQUE: Multidetector CT imaging of  the neck was performed using the standard protocol during bolus administration of intravenous contrast. Multiplanar CT image reconstructions and MIPs were obtained to evaluate the vascular anatomy. Carotid stenosis measurements (when applicable) are obtained utilizing NASCET criteria, using the distal internal carotid diameter as the denominator.  CONTRAST:  173mL OMNIPAQUE IOHEXOL 350 MG/ML SOLN  COMPARISON:  Head  CTs without contrast 02/22/2014 and earlier.  FINDINGS: Lung apices remarkable for increased peripheral septal thickening. Mild bronchiectasis also noted. No superior mediastinal lymphadenopathy. No acute osseous abnormality in the visible thorax.  Negative thyroid, larynx (glottis closed), pharynx, parapharyngeal spaces, retropharyngeal space, sublingual space, submandibular glands and parotid glands. Grossly negative visualized posterior fossa structures. Visualized paranasal sinuses and mastoids are clear. Dental hardware artifact. No acute osseous abnormality identified. Cervical spine degeneration including multilevel mild spondylolisthesis. Bulky cervical facet hypertrophy.  No cervical lymphadenopathy.  VASCULAR FINDINGS:  Bulky mural plaque or thrombus in the aortic arch (series 5, images 1 and 4). The appearance raises the possibility of ulcerated plaque.  Partially shared origin of the brachiocephalic artery and left common carotid artery.  No brachiocephalic origin stenosis despite circumferential soft plaque. No right CCA origin stenosis. Tortuous proximal right CCA. Soft plaque in the medial aspect of the right CCA at the level of the larynx. At the right carotid bifurcation, bulky soft and calcified plaque occur, affecting both the ICA and ECA origins. Tortuosity of the ICA and ECA are such that the ECA arises posteriorly. Stenosis at the right ICA origin is up to 70 % with respect to the distal vessel). Soft plaque with evidence of a small focal ulceration occurs in the right ICA bulb (series 5, image 53). Otherwise, the right ICA is widely patent to the skullbase. Mild calcified plaque in the visible right ICA siphon.  Bulky calcified plaque at the right subclavian artery origin results in high-grade stenosis best depicted on series 603, image 88. There is also severe stenosis at the right vertebral artery origin best seen on series 5, image 24 and series 604, image 59. Despite this, the right  vertebral artery is patent and dominant. It is otherwise negative to the C1 ring level where there is minimal calcified plaque. No distal or intracranial right vertebral artery stenosis identified. Patent right PICA.  There is soft plaque at the left CCA origin resulting in stenosis of up to 50-55 % with respect to the distal vessel. Beyond this level, there is mild soft and calcified plaque in the left CCA which is mildly tortuous proximally. The distal left CCA and left carotid bifurcation are capacious and rim calcified, with occasional surrounding surgical clips. The vessel caliber through this previously operated segment is up to 12 mm diameter. The ECA origin is patent. A more normal caliber are left ICA arises from the surgical segment at the C2-C3 level (series 603, image 86) without stenosis. It remains patent to the skullbase. Mild calcified plaque in the visible left ICA siphon which is patent.  Severe soft and calcified plaque at the left subclavian artery origin resulting in severe stenosis (series 603, image 99), but the left subclavian remains patent. However, no patent proximal left vertebral artery is identified. There is reconstituted flow in the left vertebral artery beginning at the C5 level, which mildly increases in the upper cervical spine likely from muscular branches. Nevertheless, the left vertebral remains thread-like at the skullbase. Does appear to remain patent. The left PICA is patent intracranially, perhaps supplied via in a retrograde fashion from the right.  Review of the MIP images confirms the above findings.  IMPRESSION: 1. Bulky soft plaque or mural thrombus in the aortic arch. Ulcerated plaque in the arch cannot be excluded. 2. No brachiocephalic artery or right CCA origin stenosis. Bulky plaque at the right carotid bifurcation resulting and high-grade right ICA origin stenosis up to 70 % with respect to the distal vessel. Note that there is tortuosity of the ICA and ECA  origins, such that the ECA arises posteriorly. 3. Postoperative change to the left CCA and left carotid bifurcation resulting in a capacious vessel (up to 12 mm diameter) from which the left ICA arises without stenosis. There is up to 55% stenosis noted at the left CCA origin off the arch. 4. High-grade atherosclerotic stenosis of the right subclavian artery origin, and the right vertebral artery origin. Despite this, the dominant right vertebral artery is patent. 5. High-grade left subclavian artery stenosis. Occluded proximal left vertebral artery which is weakly reconstituted from the C5 level to the skullbase. Left PICA probably supplied in a retrograde fashion intracranially.   Electronically Signed   By: Lars Pinks M.D.   On: 02/24/2014 10:10   Ct Chest High Resolution  02/09/2014   CLINICAL DATA:  Evaluate for pulmonary fibrosis.  EXAM: CHEST CT WITHOUT CONTRAST  TECHNIQUE: Multidetector CT imaging of the chest was performed following the standard protocol without intravenous contrast. High resolution imaging of the lungs, as well as inspiratory and expiratory imaging, was performed.  COMPARISON:  Chest CT 02/18/2013.  FINDINGS: Mediastinum: Heart size is normal. There is no significant pericardial fluid, thickening or pericardial calcification. There is atherosclerosis of the thoracic aorta, the great vessels of the mediastinum and the coronary arteries, including calcified atherosclerotic plaque in the left anterior descending, left circumflex and right coronary arteries. In addition, there continues to be severe ectasia of the ascending thoracic aorta which is nearly aneurysmal measuring 4.4 x 4.1 cm (essentially unchanged). No pathologically enlarged mediastinal are hilar lymph nodes. Please note that accurate exclusion of hilar adenopathy is limited on noncontrast CT scans. Multiple borderline enlarged mediastinal lymph nodes are again noted, but are presumably chronic and reactive, similar to prior  examinations. Esophagus is unremarkable in appearance.  Lungs/Pleura: There is again extensive subpleural and peribronchovascular reticulation throughout the lungs bilaterally, with a definitive craniocaudal gradient (most severe in the lung bases). Some patchy ground-glass attenuation is also noted. In the areas of most severe involvement there is some mild traction bronchiectasis and scattered areas of parenchymal banding. Areas of frank honeycombing are also seen throughout the lungs bilaterally, most pronounced in the lung bases. Findings appear slightly worse than prior examination from 02/18/2013. Inspiratory and expiratory imaging also demonstrates some mild air trapping throughout the lungs bilaterally, indicative of small airways disease.  Upper Abdomen: Unremarkable.  Musculoskeletal: There are no aggressive appearing lytic or blastic lesions noted in the visualized portions of the skeleton.  IMPRESSION: 1. The appearance of the lungs is again compatible with underlying interstitial lung disease, and given the strong craniocaudal gradient, the presence of honeycombing, and evidence of progression compared to the prior study, features are compatible with usual interstitial pneumonia (UIP). 2. Atherosclerosis, including three-vessel coronary artery disease. In addition, the patient continues to have severe ectasia of the ascending thoracic aorta which is nearly aneurysmal, but unchanged in size.   Electronically Signed   By: Vinnie Langton M.D.   On: 02/09/2014 12:24   Anti-infectives: Anti-infectives   Start     Dose/Rate Route Frequency Ordered  Stop   03/03/14 2200  ciprofloxacin (CIPRO) tablet 500 mg     500 mg Oral 2 times daily 03/03/14 1646     03/03/14 2000  cefUROXime (ZINACEF) 1.5 g in dextrose 5 % 50 mL IVPB     1.5 g 100 mL/hr over 30 Minutes Intravenous Every 12 hours 03/03/14 1646 03/04/14 1959   03/02/14 1358  cefUROXime (ZINACEF) 1.5 g in dextrose 5 % 50 mL IVPB     1.5 g 100  mL/hr over 30 Minutes Intravenous 30 min pre-op 03/02/14 1358 03/03/14 0846      Assessment/Plan: s/p Procedure(s): RIGHT CAROTID ENDARTERECTOMY WITH PATCH ANGIOPLASTY (Right) Stable postop day 1. His main concern this morning is of chronic heartburn. Her oxygen saturations are low off of supplemental oxygen as well. Will mobilize this morning possible discharge later today if heartburn and nausea resolves and oxygen level adequate on room air. Discussed with the patient and her family present   LOS: 1 day   Linda Kelly 03/04/2014, 6:42 AM

## 2014-03-04 NOTE — Discharge Summary (Signed)
[facility] Discharge Summary  Linda Kelly 04-09-1928 80 y.o. female  [id number]  Admission Date: 03/03/2014  Discharge Date: 03/05/14  Physician: Rosetta Posner, MD  Admission Diagnosis: Right carotid artery stenosis   HPI:   This is a 80 y.o. female who was known to Dr. Donnetta Hutching from a prior left carotid surgery. She had a probable right brain TIA with left arm weakness one month ago. She has an extensive past history. She was initially treated by myself in 1995 with severe asymptomatic high-grade carotid stenosis with endarterectomy. She had early restenosis and underwent a redo carotid in 1997. She again had a recurrent stenosis and underwent endarterectomy with vein patch angioplasty in 1999. She had been followed in our office with a duplex up tilt 2009 showing no evidence of recurrent stenosis on the left and moderate to severe stenosis on the right. She was lost to followup in 2009. At that time she had normal triphasic brachial signals and normal pressures in both brachial arteries. She reports that several weeks ago she had an episode where her entire left arm felt. On further questioning she reports that she awoke with his feeling did feel like she had some weakness as well. She did not have any speech difficulties. She is left-handed. CT of her head several days ago showed no evidence of nerve that was some old changes of probable old strokes.   Hospital Course:  The patient was admitted to the hospital and taken to the operating room on 03/03/2014 and underwent right carotid endarterectomy.  The pt tolerated the procedure well and was transported to the PACU in good condition.   By POD 1, the pt neuro status was in tact.  Her main concern that day was her chronic heartburn.  Her O2 saturations were low off of supplemental oxygen as well. By POD 2, she was feeling much better.  She continued to have dropping O2 sats off of oxygen and she will be sent home with home O2.   She is ambulating with a walker.  The remainder of the hospital course consisted of increasing mobilization and increasing intake of solids without difficulty.    Recent Labs  03/04/14 0415  NA 140  K 4.0  CL 103  CO2 26  GLUCOSE 101*  BUN 10  CALCIUM 7.8*    Recent Labs  03/03/14 1818 03/04/14 0415  WBC 12.4* 11.7*  HGB 11.5* 11.1*  HCT 34.8* 34.0*  PLT 169 172   No results found for this basename: INR,  in the last 72 hours  Discharge Instructions:   The patient is discharged to home with extensive instructions on wound care and progressive ambulation.  They are instructed not to drive or perform any heavy lifting until returning to see the physician in his office.      Discharge Orders   Future Appointments Provider Department Dept Phone   03/18/2014 10:45 AM Deneise Lever, MD [facility] [phone]   03/23/2014 1:45 PM Rosetta Posner, MD [facility] [phone]   Future Orders Complete By Expires   Call MD for:  redness, tenderness, or signs of infection (pain, swelling, bleeding, redness, odor or green/yellow discharge around incision site)  As directed    Call MD for:  severe or increased pain, loss or decreased feeling  in affected limb(s)  As directed    Call MD for:  temperature >100.5  As directed    CAROTID Sugery: Call MD for  difficulty swallowing or speaking; weakness in arms or legs that is a new symtom; severe headache.  If you have increased swelling in the neck and/or  are having difficulty breathing, CALL 911  As directed    Discharge wound care:  As directed    Driving Restrictions  As directed    Lifting restrictions  As directed    Resume previous diet  As directed       Discharge Diagnosis:  Right carotid artery stenosis  Secondary Diagnosis: Patient Active Problem List   Diagnosis Date Noted  . Carotid stenosis 03/03/2014  . Thoracic aorta atherosclerosis 02/04/2014  . Change in bowel habits  05/22/2011  . URINARY INCONTINENCE 12/22/2010  . ABDOMINAL BRUIT 08/10/2010  . HEARING LOSS 12/08/2009  . FASTING HYPERGLYCEMIA 11/15/2009  . DEPRESSION 08/01/2009  . GERD 08/01/2009  . IRRITABLE BOWEL SYNDROME 12/27/2008  . TINNITUS, CHRONIC 11/05/2008  . HEARING LOSS, HIGH FREQUENCY 11/05/2008  . HIATAL HERNIA 08/11/2008  . OTHER NONTHROMBOCYTOPENIC PURPURAS 06/08/2008  . CAROTID ARTERY DISEASE 06/08/2008  . ORTHOSTATIC HYPOTENSION 06/08/2008  . Idiopathic interstitial pneumonia 11/20/2007  . PULMONARY NODULE, SOLITARY 11/20/2007  . OBESITY, MILD 11/19/2007  . HYPERLIPIDEMIA 10/09/2007  . Tobacco abuse 10/09/2007  . HYPERTENSION 10/09/2007  . CORONARY ARTERY DISEASE 10/09/2007  . PERIPHERAL VASCULAR DISEASE 10/09/2007  . COPD 10/09/2007  . Nonspecific (abnormal) findings on radiological and other examination of body structure 10/09/2007  . VERTIGO 03/31/2007  . HEADACHE 03/31/2007   Past Medical History  Diagnosis Date  . PVD (peripheral vascular disease)   . Hyperlipidemia   . CAD (coronary artery disease)   . Tinnitus     chronic  . Internal hemorrhoid   . IBS (irritable bowel syndrome)   . High frequency hearing loss   . Chronic gastritis   . Benign neoplasm of esophagus   . Hiatal hernia   . Colon, diverticulosis   . Carotid artery disease   . Other nonthrombocytopenic purpuras   . Solitary pulmonary nodule   . Idiopathic pulmonary fibrosis   . COPD (chronic obstructive pulmonary disease)   . Vertigo   . GERD (gastroesophageal reflux disease)   . Anxiety and depression   . MI (myocardial infarction) 1985  . Arthritis   . PONV (postoperative nausea and vomiting)   . Shortness of breath     with exertion  . Stroke     Hx: of several mini -strokes  . Depression   . Anxiety   . Cancer     Skin cancer on back      Medication List         aspirin EC 81 MG tablet  Take 81 mg by mouth daily.     cholecalciferol 1000 UNITS tablet  Commonly known as:   VITAMIN D  Take 1,000 Units by mouth daily.     ciprofloxacin 500 MG tablet  Commonly known as:  CIPRO  Take 1 tablet (500 mg total) by mouth 2 (two) times daily.     clopidogrel 75 MG tablet  Commonly known as:  PLAVIX  Take 1 tablet (75 mg total) by mouth daily.     Fish Oil 1000 MG Caps  Take 1,000 mg by mouth daily.     folic acid 250 MCG tablet  Commonly known as:  FOLVITE  Take 400 mcg by mouth daily.     loratadine 10 MG tablet  Commonly known as:  CLARITIN  Take 10 mg by mouth daily as needed for allergies.  omeprazole 20 MG capsule  Commonly known as:  PRILOSEC  Take 20 mg by mouth daily.     oxyCODONE 5 MG immediate release tablet  Commonly known as:  ROXICODONE  Take 1 tablet (5 mg total) by mouth every 6 (six) hours as needed for severe pain.     sertraline 50 MG tablet  Commonly known as:  ZOLOFT  Take 50 mg by mouth daily.     simvastatin 40 MG tablet  Commonly known as:  ZOCOR  Take 40 mg by mouth daily.     vitamin E 400 UNIT capsule  Take 400 Units by mouth daily.        Roxicodone #30 No Refill  Disposition: home  Patient's condition: is Good  Follow up: 1. Dr.  Donnetta Hutching in 2 weeks.   Leontine Locket, PA-C [facility] [phone]  --- For [facility] use --- Instructions: Press F2 to tab through selections.  Delete question if not applicable.   Modified Rankin score at D/C (0-6): 0  IV medication needed for:  1. Hypertension: No 2. Hypotension: No  Post-op Complications: decreased O2 saturations and needing home O2.  1. Post-op CVA or TIA: No  If yes: Event classification (right eye, left eye, right cortical, left cortical, verterobasilar, other): n/a  If yes: Timing of event (intra-op, <6 hrs post-op, >=6 hrs post-op, unknown): n/a  2. CN injury: No  If yes: CN n/a injuried   3. Myocardial infarction: No  If yes: Dx by (EKG or clinical, Troponin): n/a  4.  CHF: No  5.  Dysrhythmia (new):  No  6. Wound infection: No  7. Reperfusion symptoms: No  8. Return to OR: No  If yes: return to OR for (bleeding, neurologic, other CEA incision, other): n/a  Discharge medications: Statin use:  Yes If No: [ ]  For Medical reasons, [ ]  Non-compliant, [ ]  Not-indicated ASA use:  Yes  If No: [ ]  For Medical reasons, [ ]  Non-compliant, [ ]  Not-indicated Beta blocker use:  No If No: [ ]  For Medical reasons, [ ]  Non-compliant, [ ]  Not-indicated ACE-Inhibitor use:  No If No: [ ]  For Medical reasons, [ ]  Non-compliant, [ ]  Not-indicated P2Y12 Antagonist use: Yes, [x ] Plavix, [ ]  Plasugrel, [ ]  Ticlopinine, [ ]  Ticagrelor, [ ]  Other, [ ]  No for medical reason, [ ]  Non-compliant, [ ]  Not-indicated Anti-coagulant use:  No, [ ]  Warfarin, [ ]  Rivaroxaban, [ ]  Dabigatran, [ ]  Other, [ ]  No for medical reason, [ ]  Non-compliant, [ ]  Not-indicated

## 2014-03-05 ENCOUNTER — Telehealth: Payer: Self-pay | Admitting: Vascular Surgery

## 2014-03-05 ENCOUNTER — Encounter (HOSPITAL_COMMUNITY): Payer: Self-pay | Admitting: Vascular Surgery

## 2014-03-05 NOTE — Progress Notes (Signed)
Linda Kelly to be D/C'd Home per MD order.  Discussed with the patient and all questions fully answered.    Medication List         aspirin EC 81 MG tablet  Take 81 mg by mouth daily.     cholecalciferol 1000 UNITS tablet  Commonly known as:  VITAMIN D  Take 1,000 Units by mouth daily.     ciprofloxacin 500 MG tablet  Commonly known as:  CIPRO  Take 1 tablet (500 mg total) by mouth 2 (two) times daily.     clopidogrel 75 MG tablet  Commonly known as:  PLAVIX  Take 1 tablet (75 mg total) by mouth daily.     Fish Oil 1000 MG Caps  Take 1,000 mg by mouth daily.     folic acid 324 MCG tablet  Commonly known as:  FOLVITE  Take 400 mcg by mouth daily.     loratadine 10 MG tablet  Commonly known as:  CLARITIN  Take 10 mg by mouth daily as needed for allergies.     omeprazole 20 MG capsule  Commonly known as:  PRILOSEC  Take 20 mg by mouth daily.     oxyCODONE 5 MG immediate release tablet  Commonly known as:  ROXICODONE  Take 1 tablet (5 mg total) by mouth every 6 (six) hours as needed for severe pain.     sertraline 50 MG tablet  Commonly known as:  ZOLOFT  Take 50 mg by mouth daily.     simvastatin 40 MG tablet  Commonly known as:  ZOCOR  Take 40 mg by mouth daily.     vitamin E 400 UNIT capsule  Take 400 Units by mouth daily.        VVS, Skin clean, dry and intact without evidence of skin break down, no evidence of skin tears noted. IV catheter discontinued intact. Site without signs and symptoms of complications. Dressing and pressure applied.  An After Visit Summary was printed and given to the patient.  D/c education completed with patient/family including follow up instructions, medication list, d/c activities limitations if indicated, with other d/c instructions as indicated by MD - patient able to verbalize understanding, all questions fully answered.   Patient instructed to return to ED, call 911, or call MD for any changes in condition.   Patient  escorted via [place], and D/C home via private auto.  [name] 03/05/2014 12:42 PM

## 2014-03-05 NOTE — Telephone Encounter (Addendum)
Message copied by Doristine Section on Fri Mar 05, 2014 10:47 AM ------      Message from: Peter Minium K      Created: Thu Mar 04, 2014  9:18 AM      Regarding: Schedule                   ----- Message -----         From: Gabriel Earing, PA-C         Sent: 03/04/2014   7:25 AM           To: Vvs Charge Pool            S/p right CEA 03/04/14.  F/u with Dr. Donnetta Hutching in 2 weeks.            Thanks,      Aldona Bar ------  notified patient of post op appt. with dr. early on 03-23-14  at 1:45

## 2014-03-05 NOTE — Plan of Care (Signed)
Problem: Phase II Discharge Progression Outcomes Goal: Ambulates without assistance Outcome: Adequate for Discharge Rolling walker to aid with ambulation Goal: O2 Saturation > or equal to 90% on room air Outcome: Adequate for Discharge On oxygen at discharge

## 2014-03-05 NOTE — Progress Notes (Signed)
SATURATION QUALIFICATIONS: (This note is used to comply with regulatory documentation for home oxygen)  Patient Saturations on Room Air at Rest = 87%  Patient Saturations on Room Air while Ambulating = 79%  Patient Saturations on 2 Liters of oxygen while Ambulating = 93%  Please briefly explain why patient needs home oxygen:

## 2014-03-10 ENCOUNTER — Telehealth: Payer: Self-pay | Admitting: Internal Medicine

## 2014-03-10 NOTE — Telephone Encounter (Signed)
Spoke with pt and advised her of CY rec's to wear O2 24-7 until next OV 03/18/14 due to sats while in hospital. Pt verbalized understanding and had no further questions.

## 2014-03-10 NOTE — Telephone Encounter (Signed)
Spoke with pt who was d/c from hospital 03/05/14 with O2. She is unsure if she needs it or how she is supposed to use it.  Reports she was not informed of how to use O2 or when at d/c I advised pt to wear O2 when she is walking or anytime she is sob. Pt has OV with CY on 03/18/14 and I informed her at that [place] we would check her for qualifying SATS for O2.  Dr. Annamaria Boots pt is still unsure of how to use her O2 and is asking for clarification on this before her OV 03/18/14. Per note from RN on 03/05/14, these are pt's SATS  Patient Saturations on Room Air at Rest = 87%  Patient Saturations on Room Air while Ambulating = 79%  Patient Saturations on 2 Liters of oxygen while Ambulating = 93%  Please advise do you want pt to wear O2 24-7 until next OV, if so please advise on how many liters

## 2014-03-10 NOTE — Telephone Encounter (Signed)
She should be using O2 24/7 based on the o2 sat scores reported from the hospital. We will reassess when she returns for OV. The oxygen is to protect her heart and brain from low oxygen levels caused by her lung disease.

## 2014-03-18 ENCOUNTER — Encounter: Payer: Self-pay | Admitting: Internal Medicine

## 2014-03-18 ENCOUNTER — Ambulatory Visit (INDEPENDENT_AMBULATORY_CARE_PROVIDER_SITE_OTHER): Payer: Medicare HMO | Admitting: Internal Medicine

## 2014-03-18 VITALS — BP 124/78 | HR 82 | Ht <= 58 in | Wt 134.2 lb

## 2014-03-18 DIAGNOSIS — I2510 Atherosclerotic heart disease of native coronary artery without angina pectoris: Secondary | ICD-10-CM

## 2014-03-18 DIAGNOSIS — Z720 Tobacco use: Secondary | ICD-10-CM

## 2014-03-18 DIAGNOSIS — J449 Chronic obstructive pulmonary disease, unspecified: Secondary | ICD-10-CM

## 2014-03-18 DIAGNOSIS — J84111 Idiopathic interstitial pneumonia, not otherwise specified: Secondary | ICD-10-CM

## 2014-03-18 DIAGNOSIS — J8410 Pulmonary fibrosis, unspecified: Secondary | ICD-10-CM

## 2014-03-18 DIAGNOSIS — F17200 Nicotine dependence, unspecified, uncomplicated: Secondary | ICD-10-CM

## 2014-03-18 DIAGNOSIS — J984 Other disorders of lung: Secondary | ICD-10-CM

## 2014-03-18 NOTE — Assessment & Plan Note (Signed)
Encouraged to stay off. I believe she will be successful.

## 2014-03-18 NOTE — Progress Notes (Signed)
Patient ID: Linda Kelly, female    DOB: [birth year], 80 y.o.   MRN: [id number]  HPI 06/07/11- 28 yoF 1/2 PPD smoker followed for pulmonary fibrosis/ ILD, COPD complicated by CAD,PAD hx lung nodule, hx [place] exposure with long term f/u here.  Still says she is trying to cut back on her smoking "trying". Been having GI complaints- Dr Carlean Purl colonoscopy. She admits tiredness overlapping with shortness of breath. She will nap and put her feet up. Not much cough- scant phlegm, no blood or purulent. Doing water aerobics 3 days/ week. Last here October 10, 2010 CXR 10/10/10- unchanged basilar fibrotic scarring- reviewed.   12/11/11-  79 yoF 1/2 PPD smoker followed for pulmonary fibrosis/ ILD, COPD complicated by CAD,PAD hx lung nodule, hx [employer] [place] exposure with long term f/u here.  Now being followed for [place]/ [place] dust exposure at [place]- they did CXR and PFT recently.  She has felt "much better" although continues to smoke against advice. At "Y" 3 days/ week.Little cough or sputum. No pain, blood, nodes, fever. CXR- 11/06/11- stable mild interstitial prominence and interstitial fibrotic changes at bases.  PFT-06/04/11- FEV1 1.26/96%, FEV1/FVC 0.9 . Slight response to bronchodilator with normal flows. Residual volume 120%, DLCO 34% which is severely reduced.  02/04/13- 69 yoF 1/2 PPD smoker followed for pulmonary fibrosis/ ILD, COPD complicated by CAD,PAD hx lung nodule, hx [employer] [place] exposure with long term f/u here.  Now being followed for [place]/ [place] dust exposure. She is still followed by occupational health but brings her extensive set of update forms here. Still smoking one half pack per day with no effort to stop. Continues water aerobics 3 times per week. Slight transient burning sensation in mid chest occasionally, relieved by resting for a while but not clearly exertional. 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 Aware of frequent heartburn and reflux.[facility] Program days for Zantac and Zoloft. Occasional wheeze. Little cough, dry. She does not feel she needs a metered inhaler. CXR 09/14/12 IMPRESSION:  Borderline cardiac size. Chronic interstitial fibrotic infiltrate  densities are again evident. No definite acute superimposed  process is identified. There is central peribronchial thickening.  No pleural effusion is evident.  Original Report Authenticated By: Shanon Brow Call   04/06/13- 82 yoF 1/2 PPD smoker followed for pulmonary fibrosis/ ILD, COPD complicated by CAD,PAD hx lung nodule, hx [employer] [place] exposure with long term f/u here.  FOLLOWS FOR: Breathing is unchanged. Here to review PFT and CT results. Less cough now than she had during the winter. Continues to smoke against advice and this was discussed again. Has had some sneezing rhinitis symptoms with the spring pollen. Admits feeling tired today. Denies chest pain, edema, discolored sputum, fever or lymph nodes. CT chest 02/20/13 IMPRESSION:  1. Mild progression of interstitial fibrosis with an appearance  most compatible with usual interstitial pneumonia.  2. Small ascending aortic aneurysm is slightly increased in size  since the prior study.  3. Calcific aortic and coronary atherosclerosis.  Original Report Authenticated By: Orlean Patten, M.D. PFT 04/21/13- spirometry within normal limits lung volumes within normal limits. Severe decrease in diffusion capacity. Insignificant response to bronchodilator.FVC 1.60/86%, FEV1 1.37/116%, FEV1/FVC 0.86, FEF 25-75% 2.10/133%. TLC 94%, DLCO 43%.  08/06/13- 40 yoF 1/2 PPD smoker followed for pulmonary fibrosis/ ILD, COPD complicated by CAD,PAD hx lung nodule, hx [employer] [place] exposure with long term  f/u here.  FOLLOWS FOR:  Breathing slightly worse since last OV.  SOB increased with exertion and coughing w/ clear mucus She continues  to smoke against all efforts and advice, and is not trying to quit. Admits maybe a little easier dyspnea on exertion, not much cough. Recognized one episode of reflux, takes omeprazole. Occasional chest tightness, nonradiating, no pain, not clearly exertional.  02/04/14- 50 yoF 1/2 PPD smoker followed for pulmonary fibrosis/ ILD, COPD complicated by CAD,PAD hx lung nodule, hx [employer] [place] exposure with long term f/u here. FOLLOWS FOR: Pt states her breathing is about the same as last visit; Activity causes her to have SOB. Less cough lately than in past. Little phlegm. No CP or palpitation. Was seen for [facility] f/u labs this AM by [facility]/ Dr Geoffry Paradise- they sent her here w/ EKG. She does not see their physician. Labs are forwarded to [facility] claims center. EKG 02/04/14- small inferior Q's, no acute. Mentions episode L hand numbness 2 weeks ago, lasted most of day. No syncope, blurring, HA. Continues BASA. Hx L CEA years ago/ Dr Donnetta Hutching. PFT-02/19/13- Normal flows, insignif response to BD, severely reduced DLCO. FVC 1.60/ 86%, FEV1 1.37/ 116%, FEV1/FVC 0.86, FEF25-75% 2.10/ 133%. TLC 94%, DLCO 43%. CT 02/20/13 IMPRESSION:  1. Mild progression of interstitial fibrosis with an appearance  most compatible with usual interstitial pneumonia.  2. Small ascending aortic aneurysm is slightly increased in size  since the prior study.  3. Calcific aortic and coronary atherosclerosis.  Original Report Authenticated By: Orlean Patten, M.D. CXR 02/04/14 IMPRESSION:  COPD with scarring.  Small left pleural effusion.  Electronically Signed  By: Franchot Gallo M.D.  On: 02/04/2014 09:40  03/18/14- 41 yoF 1/2 PPD smoker followed for pulmonary fibrosis/ ILD/UIP, COPD complicated by CAD/ hx MI,PAD hx lung nodule, hx [place] [employer] [place] dust exposure with long term f/u here. FOLLOWS FOR: uses her O2 about 3/4 the time only because someone is "nagging" her to use it. Pt feels like she does not  need the O2. Pt states her breathing is fine.    Daughter is here Walk Test on room air 03/18/14-  42 m, lowest 92% Had R carotid endarterectomy/ Dr Donnetta Hutching Denies significant cough, wheeze, phlegm, chest pain, palpitation.  Experiment for [facility] f/u-02/04/14- EKG NSR, old INF MI, CMET (incl LFT, renal, glu) wnl Cr 0.74, CBC wnl.   Labs scanned to media. Spirometry 02/04/14- WNL FVC 1.47/ 88%, FEV1 1.19/ 98%, FEV1/FVC 0.73, FEF25-75% 1.42/ 162%   Review of Systems-see HPI Constitutional:   No-   weight loss, night sweats, fevers, chills,+ fatigue, lassitude. HEENT:   No-  headaches, difficulty swallowing, tooth/dental problems, sore throat,       No-  sneezing, itching, ear ache, nasal congestion, post nasal drip,  CV:  No-   chest pain, orthopnea, PND, swelling in lower extremities, anasarca, dizziness, palpitations Resp: +shortness of breath with exertion or at rest.              No-   productive cough,  + non-productive cough,  No-  coughing up of blood.              No-   change in color of mucus.  No- wheezing.   Skin: No-   rash or lesions. GI:  + heartburn, indigestion, no-abdominal pain, nausea, vomiting,  GU: MS:  No-   joint pain or swelling.  . Neuro- No acute issues Psych:  No- change in  mood or affect. No depression or anxiety.  No memory loss.  Objective:   Physical Exam General- Alert, Oriented, Affect-appropriate, Distress- none acute, looks well, walked well. Skin- rash-none, lesions- none, excoriation- none Lymphadenopathy- none Head- atraumatic            Eyes- Gross vision intact, PERRLA, conjunctivae clear secretions            Ears- Hearing, canals normal            Nose- Clear, No-Septal dev, mucus, polyps, erosion, perforation             Throat- Mallampati II , mucosa clear , drainage- none, tonsils- atrophic;  dentures Neck- flexible , trachea midline, no stridor , thyroid nl, carotid +R bruit Chest - symmetrical excursion , unlabored           Heart/CV- RRR ,  no murmur , no gallop  , no rub, nl s1 s2                           - JVD- none , edema- none, stasis changes- none, varices- none           Lung- +crackles to mid chest, unlabored,  wheeze- none, cough- none , dullness-none,                          rub- none 96% RA           Chest wall-  Abd-  Br/ Gen/ Rectal- Not done, not indicated Extrem- cyanosis- none, clubbing- none, atrophy- none, strength- nl Neuro- word searching

## 2014-03-18 NOTE — Assessment & Plan Note (Signed)
Can't dx COPD with normal spirometry flows. Probably her fibrotic disease is maintaining airway caliber. Reduced DLCO could indicate some emphysema, but is more likely due to her fibrosis. She quit smoking. No obvious bronchospasm.

## 2014-03-18 NOTE — Assessment & Plan Note (Signed)
ASVD followed by Vascular surgery and PCP

## 2014-03-18 NOTE — Patient Instructions (Signed)
Order- ONOX on room air     Dx pulmonary fibrosis  We will begin exploring treatment with perfenadone for your pulmonary fibrosis/ UIP through the [place] to restart water aerrobics

## 2014-03-18 NOTE — Assessment & Plan Note (Signed)
[place] dust exposure - long term monitoring. CXR: 10/10/2010- interstitial fibrotic disease most prominent at the bases unchanged in appearance, compared with 10/10/2009. CXR 11/06/2011-stable mild interstitial fibrotic changes at the bases. PFT 06/14/2011-normal spirometry flows with diffusion severely reduced. FEV1 1.26/96%, FEV1/FVC 0.90, slight response to bronchodilator.  RV 120%, DLCO 34%. PFT 04/21/13- spirometry within normal limits lung volumes within normal limits. Severe decrease in diffusion capacity. Insignificant response to bronchodilator.FVC 1.60/86%, FEV1 1.37/116%, FEV1/FVC 0.86, FEF 25-75% 2.10/133%. TLC 94%, DLCO 43%. CT 02/20/13- mild progression of interstitial fibrosis with an appearance most compatible with usual interstitial pneumonia. Small ascending aortic aneurysm. Calcific aortic and coronary atherosclerosis. CT 417/15- Consistent with UIP. More complex overlap of chronic smoking/ COPD with dust exposure from [facility] is hard to rule out. Today we discussed availability of course modifying therapy with [name] or Ofev, number needed to treat, side effects, not a cure. She wants to go ahead if [place] can support cost.  Plan initiate Production manager, ONOX

## 2014-03-18 NOTE — Assessment & Plan Note (Signed)
Follow

## 2014-03-19 ENCOUNTER — Encounter: Payer: Self-pay | Admitting: Vascular Surgery

## 2014-03-20 ENCOUNTER — Other Ambulatory Visit: Payer: Self-pay | Admitting: Internal Medicine

## 2014-03-22 ENCOUNTER — Other Ambulatory Visit: Payer: Self-pay | Admitting: Internal Medicine

## 2014-03-22 DIAGNOSIS — E785 Hyperlipidemia, unspecified: Secondary | ICD-10-CM

## 2014-03-23 ENCOUNTER — Encounter: Payer: Self-pay | Admitting: Vascular Surgery

## 2014-03-23 ENCOUNTER — Ambulatory Visit (INDEPENDENT_AMBULATORY_CARE_PROVIDER_SITE_OTHER): Payer: Medicare HMO | Admitting: Vascular Surgery

## 2014-03-23 ENCOUNTER — Telehealth: Payer: Self-pay | Admitting: Vascular Surgery

## 2014-03-23 VITALS — BP 111/72 | HR 92 | Resp 18 | Ht <= 58 in | Wt 132.9 lb

## 2014-03-23 DIAGNOSIS — I6529 Occlusion and stenosis of unspecified carotid artery: Secondary | ICD-10-CM

## 2014-03-23 DIAGNOSIS — H9191 Unspecified hearing loss, right ear: Secondary | ICD-10-CM

## 2014-03-23 DIAGNOSIS — H9190 Unspecified hearing loss, unspecified ear: Secondary | ICD-10-CM

## 2014-03-23 DIAGNOSIS — Z48812 Encounter for surgical aftercare following surgery on the circulatory system: Secondary | ICD-10-CM

## 2014-03-23 NOTE — Telephone Encounter (Signed)
Spoke with pt. Gave her this info: Appointment [facility] ENT Dr. Constance Holster 03/29/14 1:30 pm [address]  [phone]. Pt verbalized understanding.

## 2014-03-23 NOTE — Progress Notes (Signed)
Patient has today for followup of her right carotid endarterectomy for symptomatic carotid disease on 03/03/2014. She is status post prior left carotid surgery with recurrent stenosis and redo carotid. He has done well since her discharge from the hospital. She did have low oxygen saturation level and was discharged on home oxygen which is I continued to be tapered. 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 Interestingly she reports decreased hearing in her right ear immediately following her right carotid surgery and this is not recovered. I am at a loss as to the cause of this.  On physical exam her right neck incision is well healed. She has no right neck bruit. She does have a soft left bruit. Neurologically otherwise she is grossly intact  Impression and plan stable status post right carotid endarterectomy first symptomatic disease. She does have a known left subclavian occlusive disease with a small left vertebral artery. She does have chronic vertigo suspect this is not related to vertebrobasilar insufficiency but we'll continue to monitor this.  We will refer her for ENT evaluation. I will see her again in 6 months with repeat carotid duplex

## 2014-03-25 ENCOUNTER — Other Ambulatory Visit (INDEPENDENT_AMBULATORY_CARE_PROVIDER_SITE_OTHER): Payer: Medicare HMO

## 2014-03-25 DIAGNOSIS — E785 Hyperlipidemia, unspecified: Secondary | ICD-10-CM

## 2014-03-25 LAB — LIPID PANEL
CHOL/HDL RATIO: 5
Cholesterol: 185 mg/dL (ref 0–200)
HDL: 41 mg/dL (ref >39.00)
LDL Cholesterol: 99 mg/dL (ref 0–99)
Triglycerides: 226 mg/dL — ABNORMAL HIGH (ref 0.0–149.0)
VLDL: 45.2 mg/dL — ABNORMAL HIGH (ref 0.0–40.0)

## 2014-03-25 LAB — HEPATIC FUNCTION PANEL
ALBUMIN: 3.8 g/dL (ref 3.5–5.2)
ALK PHOS: 76 U/L (ref 39–117)
ALT: 10 U/L (ref 0–35)
AST: 17 U/L (ref 0–37)
BILIRUBIN DIRECT: 0.1 mg/dL (ref 0.0–0.3)
Total Bilirubin: 0.6 mg/dL (ref 0.2–1.2)
Total Protein: 7.2 g/dL (ref 6.0–8.3)

## 2014-03-25 LAB — CK: CK TOTAL: 54 U/L (ref 7–177)

## 2014-03-26 ENCOUNTER — Telehealth: Payer: Self-pay | Admitting: Internal Medicine

## 2014-03-26 DIAGNOSIS — J449 Chronic obstructive pulmonary disease, unspecified: Secondary | ICD-10-CM

## 2014-03-26 NOTE — Telephone Encounter (Signed)
Pt aware and order placed.  

## 2014-03-30 ENCOUNTER — Encounter: Payer: Self-pay | Admitting: Internal Medicine

## 2014-03-30 ENCOUNTER — Ambulatory Visit (INDEPENDENT_AMBULATORY_CARE_PROVIDER_SITE_OTHER): Payer: Medicare HMO | Admitting: Internal Medicine

## 2014-03-30 VITALS — BP 110/80 | HR 91 | Temp 98.7°F | Ht <= 58 in | Wt 133.0 lb

## 2014-03-30 DIAGNOSIS — E785 Hyperlipidemia, unspecified: Secondary | ICD-10-CM

## 2014-03-30 NOTE — Progress Notes (Signed)
   Subjective:    Patient ID: Linda Kelly, female    DOB: 10-06-1928, 80 y.o.   MRN: [id number]  HPI: pt is here for f/u from visit 03/26/15 to discuss lab results  Pt has been on Zocor for many years. Denies any new myalgias. See cholesterol panel for results. Pt is s/p carotid endarterectomy 03/03/14. Pt has no complaints or problems at this time.    Review of Systems  Respiratory: Negative for shortness of breath.   Cardiovascular: Negative for chest pain, palpitations and leg swelling.       No claudication  Musculoskeletal: Negative for myalgias.       Objective:   Physical Exam        Assessment & Plan:

## 2014-03-30 NOTE — Progress Notes (Signed)
   Subjective:    Patient ID: Linda Kelly, female    DOB: 05-04-1928, 80 y.o.   MRN: [id number]  HPI  She is here to review her labs. She's been on simvastatin 40 mg for years. Significant past history includes the endarterectomy 03/03/14.  Specific discussion was held concerning the pros and cons of continuing statins in the elderly.  She has stopped smoking.      Review of Systems She denies myalgias or significant GI related symptoms. No significant memory deficits described.       Objective:   Physical Exam Appears healthy and well-nourished & in no acute distress.Appears younger than stated age  L carotid bruit present.No neck pain distention present at 10 - 15 degrees. Thyroid normal to palpation  Heart rhythm and rate are normal with no gallop or murmur. S4  Chest : diffuse ,dry rales with no increased work of breathing  There is no evidence of aortic aneurysm or renal artery bruits  Abdomen soft with no organomegaly or masses. No HJR  No clubbing, cyanosis or edema present.  Pedal pulses are equal but decreased, especially DPP   No ischemic skin changes are present . Fingernails/ toenails healthy   Alert and oriented. Strength, tone, DTRs reflexes normal          Assessment & Plan:  See Current Assessment & Plan in Problem List under specific Diagnosis

## 2014-03-30 NOTE — Progress Notes (Signed)
Pre visit review using our clinic review tool, if applicable. No additional management support is needed unless otherwise documented below in the visit note. 

## 2014-03-31 ENCOUNTER — Telehealth: Payer: Self-pay

## 2014-03-31 DIAGNOSIS — E785 Hyperlipidemia, unspecified: Secondary | ICD-10-CM

## 2014-03-31 MED ORDER — SIMVASTATIN 40 MG PO TABS: 40.0000 mg | ORAL_TABLET | Freq: Every day | ORAL | Status: DC

## 2014-03-31 NOTE — Assessment & Plan Note (Signed)
Despite age , benefits far outweigh potential adverse effects of continuing statin. This is mainly because of documented PVD. History & labs reveal no adverse effects.

## 2014-03-31 NOTE — Telephone Encounter (Signed)
Message copied by Izola Price on Wed Mar 31, 2014  9:14 AM ------      Message from: Hendricks Limes      Created: Wed Mar 31, 2014  6:24 AM       Refill  Statin #90 with 3 refills.Thanks, Hopp ------

## 2014-03-31 NOTE — Patient Instructions (Signed)
Statin will be filled for 1 year.

## 2014-04-06 ENCOUNTER — Telehealth: Payer: Self-pay

## 2014-04-06 NOTE — Telephone Encounter (Signed)
Received a fax from [facility] stating prior authorization not applicable to this plan.

## 2014-04-06 NOTE — Telephone Encounter (Signed)
Received a fax from [facility]. Prior authorization is required on Simvastatin 40 mg. This has been completed via cover my med. Waiting on insurance response.

## 2014-04-14 ENCOUNTER — Encounter: Payer: Self-pay | Admitting: Internal Medicine

## 2014-04-23 NOTE — OR Nursing (Signed)
Addendum to scope page 

## 2014-04-27 ENCOUNTER — Telehealth: Payer: Self-pay | Admitting: Internal Medicine

## 2014-04-28 NOTE — Telephone Encounter (Signed)
Linda Kelly, you took this message.  Does anything still need to be done w/ it?  Thanks!

## 2014-04-29 NOTE — Telephone Encounter (Signed)
I have refaxed all information again to them. I have packet with me if any other concerns come up.

## 2014-05-07 ENCOUNTER — Other Ambulatory Visit: Payer: Self-pay | Admitting: Internal Medicine

## 2014-05-07 ENCOUNTER — Telehealth: Payer: Self-pay | Admitting: Internal Medicine

## 2014-05-07 DIAGNOSIS — J8410 Pulmonary fibrosis, unspecified: Secondary | ICD-10-CM

## 2014-05-07 NOTE — Telephone Encounter (Signed)
Last office visit 03/30/14

## 2014-05-07 NOTE — Telephone Encounter (Signed)
Pt aware of recs Labs ordered Nothing further needed

## 2014-05-07 NOTE — Telephone Encounter (Signed)
OK X1 

## 2014-05-07 NOTE — Telephone Encounter (Signed)
Order- BMET, Hepatic panel, CBC w diff    Dx pulmonary fibrosis

## 2014-05-07 NOTE — Telephone Encounter (Signed)
Called spoke with pt. She reports she has received the esbriet in the mail. She reports reading the instructions it stated she needed to have blood work done prior to starting this. Please advise Dr. Annamaria Boots thanks

## 2014-05-10 ENCOUNTER — Other Ambulatory Visit (INDEPENDENT_AMBULATORY_CARE_PROVIDER_SITE_OTHER): Payer: Medicare HMO

## 2014-05-10 ENCOUNTER — Other Ambulatory Visit: Payer: Self-pay | Admitting: Obstetrics and Gynecology

## 2014-05-10 DIAGNOSIS — J8410 Pulmonary fibrosis, unspecified: Secondary | ICD-10-CM

## 2014-05-10 LAB — CBC WITH DIFFERENTIAL/PLATELET
BASOS PCT: 0.5 % (ref 0.0–3.0)
Basophils Absolute: 0.1 10*3/uL (ref 0.0–0.1)
EOS ABS: 0.2 10*3/uL (ref 0.0–0.7)
Eosinophils Relative: 1.9 % (ref 0.0–5.0)
HCT: 38.4 % (ref 36.0–46.0)
HEMOGLOBIN: 12.6 g/dL (ref 12.0–15.0)
Lymphocytes Relative: 17.9 % (ref 12.0–46.0)
Lymphs Abs: 1.8 10*3/uL (ref 0.7–4.0)
MCHC: 33 g/dL (ref 30.0–36.0)
MCV: 91.3 fl (ref 78.0–100.0)
MONO ABS: 1.1 10*3/uL — AB (ref 0.1–1.0)
Monocytes Relative: 10.9 % (ref 3.0–12.0)
NEUTROS ABS: 6.9 10*3/uL (ref 1.4–7.7)
NEUTROS PCT: 68.8 % (ref 43.0–77.0)
Platelets: 283 10*3/uL (ref 150.0–400.0)
RBC: 4.2 Mil/uL (ref 3.87–5.11)
RDW: 14.8 % (ref 11.5–15.5)
WBC: 10 10*3/uL (ref 4.0–10.5)

## 2014-05-10 LAB — BASIC METABOLIC PANEL
BUN: 20 mg/dL (ref 6–23)
CHLORIDE: 102 meq/L (ref 96–112)
CO2: 29 meq/L (ref 19–32)
CREATININE: 0.9 mg/dL (ref 0.4–1.2)
Calcium: 8.9 mg/dL (ref 8.4–10.5)
GFR: 66 mL/min (ref >60.00)
Glucose, Bld: 114 mg/dL — ABNORMAL HIGH (ref 70–99)
POTASSIUM: 3.9 meq/L (ref 3.5–5.1)
SODIUM: 139 meq/L (ref 135–145)

## 2014-05-10 LAB — HEPATIC FUNCTION PANEL
ALK PHOS: 80 U/L (ref 39–117)
ALT: 10 U/L (ref 0–35)
AST: 17 U/L (ref 0–37)
Albumin: 3.8 g/dL (ref 3.5–5.2)
BILIRUBIN DIRECT: 0.1 mg/dL (ref 0.0–0.3)
BILIRUBIN TOTAL: 0.6 mg/dL (ref 0.2–1.2)
Total Protein: 7.9 g/dL (ref 6.0–8.3)

## 2014-05-11 LAB — CYTOLOGY - PAP

## 2014-05-13 ENCOUNTER — Telehealth: Payer: Self-pay | Admitting: Internal Medicine

## 2014-05-13 NOTE — Telephone Encounter (Signed)
Called spoke with [place]. She reports [name] needs PA. If this has not been received and will fax the form to fill out to [phone]. Will await fax

## 2014-05-14 NOTE — Telephone Encounter (Signed)
Please have them re fax-we were told no PA needed as patient has received her meds.

## 2014-05-14 NOTE — Telephone Encounter (Signed)
[name], did you receive the fax?

## 2014-05-14 NOTE — Telephone Encounter (Signed)
PA form received and given to fill out and sign. Please advise Dr Annamaria Boots. Thanks.

## 2014-05-18 ENCOUNTER — Ambulatory Visit (INDEPENDENT_AMBULATORY_CARE_PROVIDER_SITE_OTHER): Payer: Commercial Managed Care - PPO | Admitting: Internal Medicine

## 2014-05-18 ENCOUNTER — Encounter: Payer: Self-pay | Admitting: Internal Medicine

## 2014-05-18 VITALS — BP 122/74 | HR 86 | Ht <= 58 in | Wt 137.2 lb

## 2014-05-18 DIAGNOSIS — R2681 Unsteadiness on feet: Secondary | ICD-10-CM

## 2014-05-18 DIAGNOSIS — R2689 Other abnormalities of gait and mobility: Secondary | ICD-10-CM

## 2014-05-18 DIAGNOSIS — J8410 Pulmonary fibrosis, unspecified: Secondary | ICD-10-CM

## 2014-05-18 DIAGNOSIS — Z720 Tobacco use: Secondary | ICD-10-CM

## 2014-05-18 DIAGNOSIS — R42 Dizziness and giddiness: Secondary | ICD-10-CM

## 2014-05-18 DIAGNOSIS — R269 Unspecified abnormalities of gait and mobility: Secondary | ICD-10-CM

## 2014-05-18 DIAGNOSIS — R29818 Other symptoms and signs involving the nervous system: Secondary | ICD-10-CM

## 2014-05-18 DIAGNOSIS — F17200 Nicotine dependence, unspecified, uncomplicated: Secondary | ICD-10-CM

## 2014-05-18 NOTE — Telephone Encounter (Signed)
Pt came in for appt today with CY-he is completing PA and will fax back once done. Pt is aware of this as well. She will start on Espiret starter pack she received already per CY.

## 2014-05-18 NOTE — Assessment & Plan Note (Signed)
Remains abstinent and hopefully this will stick

## 2014-05-18 NOTE — Telephone Encounter (Signed)
Please advise [name] thanks 

## 2014-05-18 NOTE — Assessment & Plan Note (Signed)
She complains of "poor balance", with hx of hearing loss, orthostasis, saying that her vascular surgeon recommended she see a Neurologist. She asks we refer. Plan- referal to neurology as she requests.

## 2014-05-18 NOTE — Progress Notes (Signed)
Patient ID: Linda Kelly, female    DOB: [birth year], 80 y.o.   MRN: [id number]  HPI 06/07/11- 28 yoF 1/2 PPD smoker followed for pulmonary fibrosis/ ILD, COPD complicated by CAD,PAD hx lung nodule, hx [place] exposure with long term f/u here.  Still says she is trying to cut back on her smoking "trying". Been having GI complaints- Dr Carlean Purl colonoscopy. She admits tiredness overlapping with shortness of breath. She will nap and put her feet up. Not much cough- scant phlegm, no blood or purulent. Doing water aerobics 3 days/ week. Last here October 10, 2010 CXR 10/10/10- unchanged basilar fibrotic scarring- reviewed.   12/11/11-  79 yoF 1/2 PPD smoker followed for pulmonary fibrosis/ ILD, COPD complicated by CAD,PAD hx lung nodule, hx [place] exposure with long term f/u here.  Now being followed for [place]/ [place] dust exposure at [place]- they did CXR and PFT recently.  She has felt "much better" although continues to smoke against advice. At "Y" 3 days/ week.Little cough or sputum. No pain, blood, nodes, fever. CXR- 11/06/11- stable mild interstitial prominence and interstitial fibrotic changes at bases.  PFT-06/04/11- FEV1 1.26/96%, FEV1/FVC 0.9 . Slight response to bronchodilator with normal flows. Residual volume 120%, DLCO 34% which is severely reduced.  02/04/13- 69 yoF 1/2 PPD smoker followed for pulmonary fibrosis/ ILD, COPD complicated by CAD,PAD hx lung nodule, hx [place] exposure with long term f/u here.  Now being followed for [place]/ [place] dust exposure. She is still followed by occupational health but brings her extensive set of update forms here. Still smoking one half pack per day with no effort to stop. Continues water aerobics 3 times per week. Slight transient burning sensation in mid chest occasionally, relieved by resting for a while but not clearly exertional. 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 Aware of frequent heartburn and reflux.[facility] days for Zantac and Zoloft. Occasional wheeze. Little cough, dry. She does not feel she needs a metered inhaler. CXR 09/14/12 IMPRESSION:  Borderline cardiac size. Chronic interstitial fibrotic infiltrate  densities are again evident. No definite acute superimposed  process is identified. There is central peribronchial thickening.  No pleural effusion is evident.  Original Report Authenticated By: Shanon Brow Call   04/06/13- 82 yoF 1/2 PPD smoker followed for pulmonary fibrosis/ ILD, COPD complicated by CAD,PAD hx lung nodule, hx [place] exposure with long term f/u here.  FOLLOWS FOR: Breathing is unchanged. Here to review PFT and CT results. Less cough now than she had during the winter. Continues to smoke against advice and this was discussed again. Has had some sneezing rhinitis symptoms with the spring pollen. Admits feeling tired today. Denies chest pain, edema, discolored sputum, fever or lymph nodes. CT chest 02/20/13 IMPRESSION:  1. Mild progression of interstitial fibrosis with an appearance  most compatible with usual interstitial pneumonia.  2. Small ascending aortic aneurysm is slightly increased in size  since the prior study.  3. Calcific aortic and coronary atherosclerosis.  Original Report Authenticated By: Orlean Patten, M.D. PFT 04/21/13- spirometry within normal limits lung volumes within normal limits. Severe decrease in diffusion capacity. Insignificant response to bronchodilator.FVC 1.60/86%, FEV1 1.37/116%, FEV1/FVC 0.86, FEF 25-75% 2.10/133%. TLC 94%, DLCO 43%.  08/06/13- 40 yoF 1/2 PPD smoker followed for pulmonary fibrosis/ ILD, COPD complicated by CAD,PAD hx lung nodule, hx [place] exposure with long term  f/u here.  FOLLOWS FOR:  Breathing slightly worse since last OV.  SOB increased with exertion and coughing w/ clear mucus She continues  to smoke against all efforts and advice, and is not trying to quit. Admits maybe a little easier dyspnea on exertion, not much cough. Recognized one episode of reflux, takes omeprazole. Occasional chest tightness, nonradiating, no pain, not clearly exertional.  02/04/14- 18 yoF 1/2 PPD smoker followed for pulmonary fibrosis/ ILD, COPD complicated by CAD,PAD hx lung nodule, hx [place] exposure with long term f/u here. FOLLOWS FOR: Pt states her breathing is about the same as last visit; Activity causes her to have SOB. Less cough lately than in past. Little phlegm. No CP or palpitation. Was seen for [facility] f/u labs this AM by [facility]/ Dr Geoffry Paradise- they sent her here w/ EKG. She does not see their physician. Labs are forwarded to [facility] claims center. EKG 02/04/14- small inferior Q's, no acute. Mentions episode L hand numbness 2 weeks ago, lasted most of day. No syncope, blurring, HA. Continues BASA. Hx L CEA years ago/ Dr Donnetta Hutching. PFT-02/19/13- Normal flows, insignif response to BD, severely reduced DLCO. FVC 1.60/ 86%, FEV1 1.37/ 116%, FEV1/FVC 0.86, FEF25-75% 2.10/ 133%. TLC 94%, DLCO 43%. CT 02/20/13 IMPRESSION:  1. Mild progression of interstitial fibrosis with an appearance  most compatible with usual interstitial pneumonia.  2. Small ascending aortic aneurysm is slightly increased in size  since the prior study.  3. Calcific aortic and coronary atherosclerosis.  Original Report Authenticated By: Orlean Patten, M.D. CXR 02/04/14 IMPRESSION:  COPD with scarring.  Small left pleural effusion.  Electronically Signed  By: Franchot Gallo M.D.  On: 02/04/2014 09:40  03/18/14- 89 yoF 1/2 PPD smoker followed for pulmonary fibrosis/ ILD/UIP, COPD complicated by CAD/ hx MI,PAD hx lung nodule, hx [place]/ [place] dust exposure with long term f/u here. FOLLOWS FOR: uses her O2 about 3/4 the time only because someone is "nagging" her to use it. Pt feels like she does not  need the O2. Pt states her breathing is fine.    Daughter is here Walk Test on room air 03/18/14-  66 m, lowest 92% Had R carotid endarterectomy/ Dr Donnetta Hutching Denies significant cough, wheeze, phlegm, chest pain, palpitation.  [place] [facility] f/u-02/04/14- EKG NSR, old INF MI, CMET (incl LFT, renal, glu) wnl Cr 0.74, CBC wnl.   Labs scanned to media. Spirometry 02/04/14- WNL FVC 1.47/ 88%, FEV1 1.19/ 98%, FEV1/FVC 0.73, FEF25-75% 1.42/ 162%   05/18/14- 85 yoF former smoker followed for pulmonary fibrosis/ ILD/UIP, COPD complicated by CAD/ hx MI,PAD hx lung nodule, hx [place]/ [place] dust exposure with long term f/u here. FOLLOWS FOR:  Pt revieved [name] and would like to discuss starting.  Breathing doing well, reports has been off balance over the past few months O2 was dc'd in May after [place]. Oxygenation has improved a bit since she quit smoking. Little routine cough or wheeze. Dyspnea noted with exertion, but she is less active since cautious about feeling unsteady. We again discussed [name] side effects/ GI. She has contact for their patient support program. She states she is willing to start and has her first shipment.  Review of Systems-see HPI Constitutional:   No-   weight loss, night sweats, fevers, chills,+ fatigue, lassitude. HEENT:   No-  headaches, difficulty swallowing, tooth/dental problems, sore throat,       No-  sneezing, itching, ear ache, nasal congestion, post  nasal drip,  CV:  No-   chest pain, orthopnea, PND, swelling in lower extremities, anasarca, dizziness, palpitations Resp: +shortness of breath with exertion or at rest.              No-   productive cough,  + non-productive cough,  No-  coughing up of blood.              No-   change in color of mucus.  No- wheezing.   Skin: No-   rash or lesions. GI:  + heartburn, indigestion, no-abdominal pain, nausea, vomiting,  GU: MS:  No-   joint pain or swelling.  . Neuro- No acute issues Psych:  No-  change in mood or affect. No depression or anxiety.  No memory loss.  Objective:   Physical Exam General- Alert, Oriented, Affect-appropriate, Distress- none acute, looks well. Skin- rash-none, lesions- none, excoriation- none Lymphadenopathy- none Head- atraumatic            Eyes- Gross vision intact, PERRLA, conjunctivae clear secretions            Ears- Hearing, canals normal            Nose- Clear, No-Septal dev, mucus, polyps, erosion, perforation             Throat- Mallampati II , mucosa clear , drainage- none, tonsils- atrophic;  dentures Neck- flexible , trachea midline, no stridor , thyroid nl, carotid +R bruit Chest - symmetrical excursion , unlabored           Heart/CV- RRR , no murmur , no gallop  , no rub, nl s1 s2                           - JVD- none , edema- none, stasis changes- none, varices- none           Lung- +crackles to mid chest, unlabored at rest,  wheeze- none, cough- none , dullness-none,                          rub- none 96% RA           Chest wall-  Abd-  Br/ Gen/ Rectal- Not done, not indicated Extrem- cyanosis- none, clubbing- none, atrophy- none, strength- nl Neuro- +word searching

## 2014-05-18 NOTE — Assessment & Plan Note (Signed)
Starting [name]/ perfenadone for UIP. She is well aware of the GI side-effects.

## 2014-05-18 NOTE — Patient Instructions (Addendum)
Ok to go ahead with the [place]. We are taking care of the last of the authorization forms. Call their help line with questions or side effect control suggestions.   Order- referral to [facility] for poor balance

## 2014-05-18 NOTE — Telephone Encounter (Signed)
Calling checking on status of PA for medication on this pt, has forms been signed and faxed.Hillery Hunter

## 2014-05-20 ENCOUNTER — Telehealth: Payer: Self-pay | Admitting: Internal Medicine

## 2014-05-20 NOTE — Telephone Encounter (Signed)
ATC [name] was on hold x 20 min. wcb

## 2014-05-21 NOTE — Telephone Encounter (Signed)
Linda Kelly returned call.  Spoke with Linda Kelly who reported that both the Ofev and [name] require PA but her portion of this is to mainly obtain more information so that the [facility] may offer cost support to the patient.  Linda Kelly is requesting the 03/18/14 and 05/18/14 ov notes where the [name] was discussed faxed to her attn at [phone], marked urgent.  It does not appear that CDY chose the [name] for any specific reason and this was relayed to [name].  Ov notes printed and faxed.  Nothing further needed at this time per Linda Kelly, will sign off.

## 2014-05-21 NOTE — Telephone Encounter (Signed)
[employer] - was on hold x10 mins and then transferred to voice mail b/c all representatives we busy with other callers.  LMOM to have someone call the office back.  Per Joellen Jersey, if [name] is not covered without PA will Ofev be covered?

## 2014-05-24 NOTE — Telephone Encounter (Signed)
Insurance wants to use Ofev instead of [employer]

## 2014-05-24 NOTE — Telephone Encounter (Signed)
[name] please advise status of this PA. thanks

## 2014-05-25 NOTE — Telephone Encounter (Signed)
Rinaldo Ratel, CMA at 05/21/2014 11:14 AM     Status: Signed        Linda Kelly returned call. Spoke with Linda Kelly who reported that both the Ofev and [name] require PA but her portion of this is to mainly obtain more information so that the [facility] may offer cost support to the patient. Linda Kelly is requesting the 03/18/14 and 05/18/14 ov notes where the [name] was discussed faxed to her attn at [phone], marked urgent. It does not appear that CDY chose the [name] for any specific reason and this was relayed to [place].  Ov notes printed and faxed. Nothing further needed at this time per Linda Kelly, will sign off.

## 2014-05-26 ENCOUNTER — Telehealth: Payer: Self-pay | Admitting: Internal Medicine

## 2014-05-26 NOTE — Telephone Encounter (Signed)
PA form for the esbriet has been printed out and placed on CY cart to complete and sign. Will fax back once completed by CY.  Will forward to [name] to follow up on.

## 2014-06-02 NOTE — Telephone Encounter (Signed)
Pt aware that Dr Annamaria Boots is not in office until next week--06/07/14 Pt has appt with CY 06/29/14--states that she has plenty of medication to last for this process.  Will send to Dr Annamaria Boots to address when he returns.  Please advise Dr Annamaria Boots. Thanks.

## 2014-06-02 NOTE — Telephone Encounter (Signed)
Calling to check on the status of esbriet rx.  [phone]

## 2014-06-03 ENCOUNTER — Other Ambulatory Visit: Payer: Self-pay | Admitting: Internal Medicine

## 2014-06-08 NOTE — Telephone Encounter (Signed)
Linda Kelly from the [facility]  program call a/b this pt wanting to know what med's she has been on, to get her in the program she can be reached @ [phone].Linda Kelly

## 2014-06-10 NOTE — Telephone Encounter (Signed)
I called the number listed and was on hold for 10 min w/o getting to speak with anyone  Sounds like they are needing the PA form to be faxed back  [name], please advise the status of this thanks!

## 2014-06-10 NOTE — Telephone Encounter (Signed)
[name] w/ the [facility] is calling back.  Asks to speak w/ Janett Billow.  [phone].  Satira Anis

## 2014-06-14 NOTE — Telephone Encounter (Signed)
We are waiting on [place] organization to move forward on [place]. [name] aware.

## 2014-06-25 ENCOUNTER — Encounter: Payer: Self-pay | Admitting: Neurology

## 2014-06-25 ENCOUNTER — Ambulatory Visit (INDEPENDENT_AMBULATORY_CARE_PROVIDER_SITE_OTHER): Payer: Medicare HMO | Admitting: Neurology

## 2014-06-25 ENCOUNTER — Other Ambulatory Visit: Payer: Self-pay

## 2014-06-25 VITALS — BP 122/60 | HR 68 | Temp 97.0°F | Resp 16 | Ht <= 58 in | Wt 141.3 lb

## 2014-06-25 DIAGNOSIS — I658 Occlusion and stenosis of other precerebral arteries: Secondary | ICD-10-CM

## 2014-06-25 DIAGNOSIS — I6523 Occlusion and stenosis of bilateral carotid arteries: Secondary | ICD-10-CM

## 2014-06-25 DIAGNOSIS — R42 Dizziness and giddiness: Secondary | ICD-10-CM

## 2014-06-25 DIAGNOSIS — I6529 Occlusion and stenosis of unspecified carotid artery: Secondary | ICD-10-CM

## 2014-06-25 MED ORDER — RANITIDINE HCL 150 MG PO TABS: ORAL_TABLET | ORAL | Status: DC

## 2014-06-25 NOTE — Progress Notes (Signed)
NEUROLOGY CONSULTATION NOTE  [name] MRN: [id number] DOB: 12-Jul-1928  Referring provider: Dr. Linna Darner Primary care provider: Dr. Linna Darner   Reason for consult:  Gait instability  HISTORY OF PRESENT ILLNESS: Linda Kelly is an 80 year old left-handed woman with pulmonary fibrosis/ILD, COPD,  PPD smoker, chronic vertigo and carotid stenosis status post left CEA and recent right CEA who presents for poor balance.    She began noticing dizziness and problems with balance about 4 or 5 months ago.  She describes the dizziness as a "blank" feeling.  It is not a spinning sensation.  It is a lightheadedness or wooziness, but not a feeling that she is going to pass out.  It occurs when she stands up or turns in bed.  It also occurs sometimes while walking.  It only occurs for a few seconds.  When this occurs, she will suddenly stumble towards to right.  It is occasionally associated with mild nausea.  She does have tinnitus.  She denies headache, visual disturbance, focal numbness or weakness.  She denies slurred speech during the spells.  In April, she developed left upper extremity numbness, lasting 24 hours.  CT of the head without contrast was performed on 02/22/14, which revealed bilateral frontal atrophy, stable from prior scan on 03/23/07, and prior lacunar infarcts in the anterior limb of the right internal capsule and head of the caudate, but no acute findings.  She had a CTA of the neck with and with and without contrast performed on 02/24/14, which revealed high-grade right ICA origin stenosis up to 70% and post-operative changes to the left CCA and left carotid bifurcation with up to 55% stenosis at the left CCA origin off the arch.  Following the surgery, she developed hearing loss in the right ear.  She was evaluated by ENT, who determined that it was due to cerumen impaction.  She also had some conduction hearing loss.  She wasn't evaluated for the disequilibrium, however.  Since the surgery, she  also developed right mandibular nerve palsy due to lip retraction during the procedure.   She takes ASA 81mg , Plavix, and simvastatin 40mg .  03/25/14 LABS:  Cholesterol 185, TG 226, LDL 99.  PAST MEDICAL HISTORY: Past Medical History  Diagnosis Date  . PVD (peripheral vascular disease)   . Hyperlipidemia   . CAD (coronary artery disease)   . Tinnitus     chronic  . Internal hemorrhoid   . IBS (irritable bowel syndrome)   . High frequency hearing loss   . Chronic gastritis   . Benign neoplasm of esophagus   . Hiatal hernia   . Colon, diverticulosis   . Carotid artery disease   . Other nonthrombocytopenic purpuras   . Solitary pulmonary nodule   . Idiopathic pulmonary fibrosis   . COPD (chronic obstructive pulmonary disease)   . Vertigo   . GERD (gastroesophageal reflux disease)   . Anxiety and depression   . MI (myocardial infarction) 1985  . Arthritis   . PONV (postoperative nausea and vomiting)   . Shortness of breath     with exertion  . Stroke     Hx: of several mini -strokes  . Depression   . Anxiety   . Cancer     Skin cancer on back    PAST SURGICAL HISTORY: Past Surgical History  Procedure Laterality Date  . Foot surgery      bilateral  . Toe surgery      right foot  . Carotid  endarterectomy      left side x 3, saphenous vein graft from left leg  . Tonsillectomy    . Shoulder surgery      left  . Dental surgery      2012   . Angioplasty  1997, 98, 99  . Upper gastrointestinal endoscopy  11-27-01, 08-18-08    Hiatal hernia, benign esophagus neoplasia, gastritis, tortuous esophagus 58 Maloney dilation performed  . Colonoscopy  11-27-01, 10-30-05, 05-24-11    diverticulosis, hemorrhoids  . Colon surgery    . Cataract extraction w/ intraocular lens  implant, bilateral    . Breast surgery      Hx; of biopsy  . Tubal ligation    . Endarterectomy Right 03/03/2014    Procedure: RIGHT CAROTID ENDARTERECTOMY WITH PATCH ANGIOPLASTY;  Surgeon: Rosetta Posner, MD;   Location: [facility] OR;  Service: Vascular;  Laterality: Right;    MEDICATIONS: Current Outpatient Prescriptions on File Prior to Visit  Medication Sig Dispense Refill  . cholecalciferol (VITAMIN D) 1000 UNITS tablet Take 1,000 Units by mouth daily.      . clopidogrel (PLAVIX) 75 MG tablet Take 1 tablet (75 mg total) by mouth daily.  90 tablet  1  . folic acid (FOLVITE) 884 MCG tablet Take 400 mcg by mouth daily.        Marland Kitchen loratadine (CLARITIN) 10 MG tablet Take 10 mg by mouth daily as needed for allergies.      . Omega-3 Fatty Acids (FISH OIL) 1000 MG CAPS Take 1,000 mg by mouth daily.       Marland Kitchen oxyCODONE (ROXICODONE) 5 MG immediate release tablet Take 1 tablet (5 mg total) by mouth every 6 (six) hours as needed for severe pain.  30 tablet  0  . sertraline (ZOLOFT) 50 MG tablet Take 50 mg by mouth daily.      . sertraline (ZOLOFT) 50 MG tablet TAKE 1 TABLET BY MOUTH ONCE DAILY  90 tablet  0  . simvastatin (ZOCOR) 40 MG tablet Take 1 tablet (40 mg total) by mouth daily.  90 tablet  3  . vitamin E 400 UNIT capsule Take 400 Units by mouth daily.      Marland Kitchen aspirin EC 81 MG tablet Take 81 mg by mouth daily.       Current Facility-Administered Medications on File Prior to Visit  Medication Dose Route Frequency Provider Last Rate Last Dose  . 0.9 %  sodium chloride infusion  500 mL Intravenous Continuous Gatha Mayer, MD        ALLERGIES: No Known Allergies  FAMILY HISTORY: Family History  Problem Relation Age of Onset  . Heart attack Father     deceased at 41, MGF  . Colon cancer Father   . Prostate cancer Father   . Heart disease Mother     deaceased at age 18  . Colitis Mother   . Stroke Mother   . Colitis Sister   . Other Brother     deceased age 64; war  . Breast cancer Maternal Aunt     great   . Lung cancer Paternal Aunt     great  . Vasculitis Son   . COPD Neg Hx   . Asthma Neg Hx   . Cancer Sister     unknown    SOCIAL HISTORY: History   Social History  . Marital  Status: Widowed    Spouse Name: N/A    Number of Children: 6  . Years of Education: N/A  Occupational History  . 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     Social History Main Topics  . Smoking status: Former Smoker -- 0.50 packs/day for 60 years    Types: Cigarettes    Quit date: 02/26/2014  . Smokeless tobacco: Never Used  . Alcohol Use: No  . Drug Use: No  . Sexual Activity: No   Other Topics Concern  . Not on file   Social History Narrative   [place] to [place] twice a year to visit sister- 3-4 weeks travel there every year.   Pt does not get regular exercise    REVIEW OF SYSTEMS: Constitutional: No fevers, chills, or sweats, no generalized fatigue, change in appetite Eyes: No visual changes, double vision, eye pain Ear, nose and throat: No hearing loss, ear pain, nasal congestion, sore throat Cardiovascular: No chest pain, palpitations Respiratory:  No shortness of breath at rest or with exertion, wheezes GastrointestinaI: No nausea, vomiting, diarrhea, abdominal pain, fecal incontinence Genitourinary:  No dysuria, urinary retention or frequency Musculoskeletal:  No neck pain, back pain Integumentary: No rash, pruritus, skin lesions Neurological: as above Psychiatric: No depression, insomnia, anxiety Endocrine: No palpitations, fatigue, diaphoresis, mood swings, change in appetite, change in weight, increased thirst Hematologic/Lymphatic:  No anemia, purpura, petechiae. Allergic/Immunologic: no itchy/runny eyes, nasal congestion, recent allergic reactions, rashes  PHYSICAL EXAM: Filed Vitals:   06/25/14 1352  BP: 122/60  Pulse: 68  Temp: 97 F (36.1 C)  Resp: 16   General: No acute distress Head:  Normocephalic/atraumatic Neck: supple, no paraspinal tenderness, full range of motion Back: No paraspinal tenderness Heart: regular rate and rhythm Lungs: Clear to auscultation bilaterally. Vascular: Left carotid  bruit Neurological Exam: Mental status: alert and oriented to person, place, and time, recent and remote memory intact, fund of knowledge intact, attention and concentration intact, speech fluent and not dysarthric, language intact. Cranial nerves: CN I: not tested CN II: pupils equal, round and reactive to light, visual fields intact, fundi unremarkable, without vessel changes, exudates, hemorrhages or papilledema. CN III, IV, VI:  full range of motion, no ptosis.  Fatigable horizontal nystagmus bilaterally CN V: facial sensation intact CN VII: upper and lower face symmetric CN VIII: hearing intact CN IX, X: gag intact, uvula midline CN XI: sternocleidomastoid and trapezius muscles intact CN XII: tongue midline Bulk & Tone: normal, no fasciculations. Motor: 5/5 throughout Sensation: mildly reduced vibration sensation in the feet.  Pinprick sensation intact. Deep Tendon Reflexes: 1+ in upper extremities, absent in lower extremities, toes downgoing Finger to nose testing: no dysmetria Heel to shin: no dysmetria Gait: cautious stride and will suddenly stumble to the right.  Unable to walk in tandem. Romberg with mild sway.  IMPRESSION: Disequilibrium.  PLAN: From a neurologic standpoint, we can get an MRI/MRA of the head to look for evidence of vertebrobasilar insufficiency or posterior circulation stroke that could account for this.  She's not sure she wants to do this but she will call us if she would like to proceed with this test.  Also consider vestibular rehab or evaluation by ENT for this.  Otherwise, continue secondary stroke prevention.  ASA and Plavix.  Statin therapy (LDL at goal of less than 100).  Smoking cessation.  45 minutes spent with patient, over 50% spent discussing possible etiologies and utility of further workup. Thank you for allowing me to take part in the care of this patient.  Metta Clines, DO  CC:  Unice Cobble, MD

## 2014-06-25 NOTE — Patient Instructions (Addendum)
To evaluate for a neurological cause for the dizziness, I would get an MRI of the brain.  Otherwise, consider seeing Ear Nose [facility] Throat.  There is physical therapy for dizziness as well.  Please call if you would like to pursue the MRI.  Smoking cessation is important too.

## 2014-06-28 ENCOUNTER — Other Ambulatory Visit: Payer: Self-pay

## 2014-06-28 ENCOUNTER — Telehealth: Payer: Self-pay | Admitting: Internal Medicine

## 2014-06-28 DIAGNOSIS — E785 Hyperlipidemia, unspecified: Secondary | ICD-10-CM

## 2014-06-28 MED ORDER — SIMVASTATIN 40 MG PO TABS: 40.0000 mg | ORAL_TABLET | Freq: Every day | ORAL | Status: DC

## 2014-06-28 MED ORDER — RANITIDINE HCL 150 MG PO TABS: ORAL_TABLET | ORAL | Status: DC

## 2014-06-28 MED ORDER — SERTRALINE HCL 50 MG PO TABS: ORAL_TABLET | ORAL | Status: DC

## 2014-06-28 NOTE — Telephone Encounter (Signed)
Joellen Jersey do you know anything about the PA for this pt on her esbriet?  thanks

## 2014-06-28 NOTE — Telephone Encounter (Signed)
I have attempted to call [name] back-was unable to reach anyone as the reps were with other patients/providers and would have to call back later. Will need to call back tomorrow.   I have received some information on patient this afternoon-states patient did not want to start [name] until speaking with her MD(noted 06-02-14) and if patient decides to start then she will need to contact [facility] at [phone] to schedule shipment.   Will attempt call back to [facility] tomorrow and then call patient if needed.

## 2014-06-29 ENCOUNTER — Ambulatory Visit (INDEPENDENT_AMBULATORY_CARE_PROVIDER_SITE_OTHER): Payer: Medicare HMO | Admitting: Internal Medicine

## 2014-06-29 ENCOUNTER — Encounter: Payer: Self-pay | Admitting: Internal Medicine

## 2014-06-29 VITALS — BP 120/70 | HR 92 | Ht <= 58 in | Wt 140.6 lb

## 2014-06-29 DIAGNOSIS — J84111 Idiopathic interstitial pneumonia, not otherwise specified: Secondary | ICD-10-CM

## 2014-06-29 DIAGNOSIS — J439 Emphysema, unspecified: Secondary | ICD-10-CM

## 2014-06-29 DIAGNOSIS — J438 Other emphysema: Secondary | ICD-10-CM

## 2014-06-29 DIAGNOSIS — Z23 Encounter for immunization: Secondary | ICD-10-CM

## 2014-06-29 NOTE — Patient Instructions (Signed)
We will continue to work on the process for getting [place] supplied and will be in touch with you as that clarifies.  Flu shot

## 2014-06-29 NOTE — Progress Notes (Signed)
Patient ID: Linda Kelly, female    DOB: [birth year], 80 y.o.   MRN: [id number]  HPI 06/07/11- 80 yoF 1/2 PPD smoker followed for pulmonary fibrosis/ ILD, COPD complicated by CAD,PAD hx lung nodule, hx [place] exposure with long term f/u here.  Still says she is trying to cut back on her smoking "trying". Been having GI complaints- Dr Carlean Purl colonoscopy. She admits tiredness overlapping with shortness of breath. She will nap and put her feet up. Not much cough- scant phlegm, no blood or purulent. Doing water aerobics 3 days/ week. Last here October 10, 2010 CXR 10/10/10- unchanged basilar fibrotic scarring- reviewed.   12/11/11-  80 yoF 1/2 PPD smoker followed for pulmonary fibrosis/ ILD, COPD complicated by CAD,PAD hx lung nodule, hx [place] exposure with long term f/u here.  Now being followed for [place]/ [place] dust exposure at [place]- they did CXR and PFT recently.  She has felt "much better" although continues to smoke against advice. At "Y" 3 days/ week.Little cough or sputum. No pain, blood, nodes, fever. CXR- 11/06/11- stable mild interstitial prominence and interstitial fibrotic changes at bases.  PFT-06/04/11- FEV1 1.26/96%, FEV1/FVC 0.9 . Slight response to bronchodilator with normal flows. Residual volume 120%, DLCO 34% which is severely reduced.  02/04/13- 80 yoF 1/2 PPD smoker followed for pulmonary fibrosis/ ILD, COPD complicated by CAD,PAD hx lung nodule, hx [place] exposure with long term f/u here.  Now being followed for [place]/ [place] dust exposure. She is still followed by occupational health but brings her extensive set of update forms here. Still smoking one half pack per day with no effort to stop. Continues water aerobics 3 times per week. Slight transient burning sensation in mid chest occasionally, relieved by resting for a while but not clearly exertional. 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 Aware of frequent heartburn and reflux.[place] Program days for Zantac and Zoloft. Occasional wheeze. Little cough, dry. She does not feel she needs a metered inhaler. CXR 09/14/12 IMPRESSION:  Borderline cardiac size. Chronic interstitial fibrotic infiltrate  densities are again evident. No definite acute superimposed  process is identified. There is central peribronchial thickening.  No pleural effusion is evident.  Original Report Authenticated By: Shanon Brow Call   04/06/13- 80 yoF 1/2 PPD smoker followed for pulmonary fibrosis/ ILD, COPD complicated by CAD,PAD hx lung nodule, hx [employer] [place] exposure with long term f/u here.  FOLLOWS FOR: Breathing is unchanged. Here to review PFT and CT results. Less cough now than she had during the winter. Continues to smoke against advice and this was discussed again. Has had some sneezing rhinitis symptoms with the spring pollen. Admits feeling tired today. Denies chest pain, edema, discolored sputum, fever or lymph nodes. CT chest 02/20/13 IMPRESSION:  1. Mild progression of interstitial fibrosis with an appearance  most compatible with usual interstitial pneumonia.  2. Small ascending aortic aneurysm is slightly increased in size  since the prior study.  3. Calcific aortic and coronary atherosclerosis.  Original Report Authenticated By: Orlean Patten, M.D. PFT 04/21/13- spirometry within normal limits lung volumes within normal limits. Severe decrease in diffusion capacity. Insignificant response to bronchodilator.FVC 1.60/86%, FEV1 1.37/116%, FEV1/FVC 0.86, FEF 25-75% 2.10/133%. TLC 94%, DLCO 43%.  08/06/13- 80 yoF 1/2 PPD smoker followed for pulmonary fibrosis/ ILD, COPD complicated by CAD,PAD hx lung nodule, hx [place] exposure with long term  f/u here.  FOLLOWS FOR:  Breathing slightly worse since last OV.  SOB increased with exertion and coughing w/ clear mucus She continues  to smoke against all efforts and advice, and is not trying to quit. Admits maybe a little easier dyspnea on exertion, not much cough. Recognized one episode of reflux, takes omeprazole. Occasional chest tightness, nonradiating, no pain, not clearly exertional.  02/04/14- 80 yoF 1/2 PPD smoker followed for pulmonary fibrosis/ ILD, COPD complicated by CAD,PAD hx lung nodule, hx [place] exposure with long term f/u here. FOLLOWS FOR: Pt states her breathing is about the same as last visit; Activity causes her to have SOB. Less cough lately than in past. Little phlegm. No CP or palpitation. Was seen for [facility] f/u labs this AM by [facility]/ Dr Geoffry Paradise- they sent her here w/ EKG. She does not see their physician. Labs are forwarded to [facility] claims center. EKG 02/04/14- small inferior Q's, no acute. Mentions episode L hand numbness 2 weeks ago, lasted most of day. No syncope, blurring, HA. Continues BASA. Hx L CEA years ago/ Dr Donnetta Hutching. PFT-02/19/13- Normal flows, insignif response to BD, severely reduced DLCO. FVC 1.60/ 86%, FEV1 1.37/ 116%, FEV1/FVC 0.86, FEF25-75% 2.10/ 133%. TLC 94%, DLCO 43%. CT 02/20/13 IMPRESSION:  1. Mild progression of interstitial fibrosis with an appearance  most compatible with usual interstitial pneumonia.  2. Small ascending aortic aneurysm is slightly increased in size  since the prior study.  3. Calcific aortic and coronary atherosclerosis.  Original Report Authenticated By: Orlean Patten, M.D. CXR 02/04/14 IMPRESSION:  COPD with scarring.  Small left pleural effusion.  Electronically Signed  By: Franchot Gallo M.D.  On: 02/04/2014 09:40  03/18/14- 80 yoF 1/2 PPD smoker followed for pulmonary fibrosis/ ILD/UIP, COPD complicated by CAD/ hx MI,PAD hx lung nodule, hx [place]/ [place] dust exposure with long term f/u here. FOLLOWS FOR: uses her O2 about 3/4 the time only because someone is "nagging" her to use it. Pt feels like she does not  need the O2. Pt states her breathing is fine.    Daughter is here Walk Test on room air 03/18/14-  23 m, lowest 92% Had R carotid endarterectomy/ Dr Donnetta Hutching Denies significant cough, wheeze, phlegm, chest pain, palpitation.  [place] for [facility] f/u-02/04/14- EKG NSR, old INF MI, CMET (incl LFT, renal, glu) wnl Cr 0.74, CBC wnl.   Labs scanned to media. Spirometry 02/04/14- WNL FVC 1.47/ 88%, FEV1 1.19/ 98%, FEV1/FVC 0.73, FEF25-75% 1.42/ 162%   05/18/14- 85 yoF former smoker followed for pulmonary fibrosis/ ILD/UIP, COPD complicated by CAD/ hx MI,PAD hx lung nodule, hx [place]/ [place] dust exposure with long term f/u here. FOLLOWS FOR:  Pt revieved [name] and would like to discuss starting.  Breathing doing well, reports has been off balance over the past few months O2 was dc'd in May after [place]. Oxygenation has improved a bit since she quit smoking. Little routine cough or wheeze. Dyspnea noted with exertion, but she is less active since cautious about feeling unsteady. We again discussed [name] side effects/ GI. She has contact for their patient support program. She states she is willing to start and has her first shipment.  06/29/14- 56 yoF former smoker followed for pulmonary fibrosis/ ILD/UIP, COPD complicated by CAD/ hx MI,PAD hx lung nodule, dizziness, hx [place]/ [place] dust exposure with long term f/u here. Working on [employer] approval w/ [employer]. She completed 270  tab starter set- well tolerated up to 3 tabs TID, running out one week ago., but we think [facility] is hold-up for continuing Rx.  Has sun tan lotion .Had neurology workup for dizziness. Also saw ENT. MRI scan was recommended but she wanted to wait to see if the dizziness would get worse. No loss of hearing and no headache. Taking swim classes for exercise  Review of Systems-see HPI Constitutional:   No-   weight loss, night sweats, fevers, chills,+ fatigue, lassitude. HEENT:   No-   headaches, difficulty swallowing, tooth/dental problems, sore throat,       No-  sneezing, itching, ear ache, nasal congestion, post nasal drip,  CV:  No-   chest pain, orthopnea, PND, swelling in lower extremities, anasarca, dizziness, palpitations Resp: +shortness of breath with exertion or at rest.              No-   productive cough,  + non-productive cough,  No-  coughing up of blood.              No-   change in color of mucus.  No- wheezing.   Skin: No-   rash or lesions. GI:  + heartburn, indigestion, no-abdominal pain, nausea, vomiting,  GU: MS:  No-   joint pain or swelling.  . Neuro- + dizziness Psych:  No- change in mood or affect. No depression or anxiety.  No memory loss.  Objective:   Physical Exam General- Alert, Oriented, Affect-appropriate, Distress- none acute, looks well. Skin- rash-none, lesions- none, excoriation- none Lymphadenopathy- none Head- atraumatic            Eyes- Gross vision intact, PERRLA, conjunctivae clear secretions            Ears- Hearing, canals normal            Nose- Clear, No-Septal dev, mucus, polyps, erosion, perforation             Throat- Mallampati II , mucosa clear , drainage- none, tonsils- atrophic;  dentures Neck- flexible , trachea midline, no stridor , thyroid nl, carotid +R bruit Chest - symmetrical excursion , unlabored           Heart/CV- RRR , no murmur , no gallop  , no rub, nl s1 s2                           - JVD- none , edema- none, stasis changes- none, varices- none           Lung- +crackles to mid chest, unlabored at rest,  wheeze- none, cough- none ,                                dullness-none, rub- none 96% RA           Chest wall-  Abd-  Br/ Gen/ Rectal- Not done, not indicated Extrem- cyanosis- none, clubbing- none, atrophy- none, strength- nl Neuro- +seems alert and sharp at this visit

## 2014-07-05 NOTE — Assessment & Plan Note (Signed)
With her long smoking history, it is likely that restriction from her interstitial lung disease is stabilizing airways distended by emphysema Plan-flu shot

## 2014-07-05 NOTE — Assessment & Plan Note (Signed)
Being managed as UIP and we anticipate resuming [name] been approved. I don't have a way to prove any link between this condition and her [facility] exposure. Plan-flu shot

## 2014-07-06 ENCOUNTER — Other Ambulatory Visit: Payer: Self-pay

## 2014-07-06 DIAGNOSIS — E785 Hyperlipidemia, unspecified: Secondary | ICD-10-CM

## 2014-07-06 NOTE — Telephone Encounter (Signed)
Opened in error

## 2014-07-07 NOTE — Telephone Encounter (Signed)
Spoke with Brenda-states that they received a request from their "sister" company requesting information on [place] for patient. They need medical hx, risk factors, progression of disease and any prior therapy on patient(needs to include dates and duration). We will need to fax this directly to [facility] at [phone]. Mark Urgent!!!  [name] [phone] #1 #4 Blue Team.    Spoke with patient-she is aware that we are working directly with [name] to get PA worked out for patient and that I will keep her updated as I have more information.

## 2014-07-07 NOTE — Telephone Encounter (Signed)
Linda Kelly returning call please call back .Hillery Hunter

## 2014-07-07 NOTE — Telephone Encounter (Signed)
Was able to get Linda Kelly on the phone Linda Kelly asking to speak with her directly Call transferred to [facility]; will route message back to her as well

## 2014-07-07 NOTE — Telephone Encounter (Signed)
Please see additional phone note on 06-28-14. Thanks.

## 2014-07-07 NOTE — Telephone Encounter (Signed)
LM for Brenda-[facility] to return call.

## 2014-07-08 NOTE — Telephone Encounter (Signed)
[name] is returning call & believes that we were trying to reach [employer] w/ [facility], [phone] Option [phone].  If we have any questions, or really needed to speak w/ [facility], Suanne Marker can be reached [phone].  Linda Kelly

## 2014-07-14 NOTE — Telephone Encounter (Signed)
I am working closely with [name] about this; please continue to hold in my basket until this matter has been resolved with [name] at [facility]. Thanks.

## 2014-07-14 NOTE — Telephone Encounter (Signed)
Ov notes,CXR results, Ct Chest results, and PFT results (send per CY) have been faxed to Linda Kelly's attention as URGENT.

## 2014-07-14 NOTE — Telephone Encounter (Signed)
[name], did you ever follow up on this? Please advise thanks

## 2014-07-20 ENCOUNTER — Telehealth: Payer: Self-pay | Admitting: Internal Medicine

## 2014-07-20 NOTE — Telephone Encounter (Signed)
Spoke with Linda Kelly did receive all the information we faxed on 07-14-14; she has sent this information to her clinical review team and should have an answer for [name] being charged under [facility] by the end of the week if no further information is needed.   I have called patient and she is aware of this.

## 2014-07-20 NOTE — Telephone Encounter (Signed)
Spoke with patient-she is referring to [place]; she is aware that I have been working closely with [name] at [facility] and has updates about this. See phone note dated 06/28/14 for more information.

## 2014-07-27 NOTE — Telephone Encounter (Signed)
Linda Kelly, please advise on update. Thanks.  

## 2014-07-30 ENCOUNTER — Telehealth: Payer: Self-pay | Admitting: Internal Medicine

## 2014-07-30 NOTE — Telephone Encounter (Signed)
Virl Cagey, CMA at 07/30/2014 11:58 AM     Status: Signed        Pt calling again asking status of [name]  See phone note 06/28/14  Will send to Joellen Jersey has she has been following up on this.       I have called [name] at [facility] about this matter-was told she is out of the office today and was transferred to another Team member to assist with this matter(Hillary). After reviewing patients information in their system-07-20-14 information was sent to leadership. No other updates are available at this time. Hillary checked with Brenda's team member as Ramiro Harvest did not have a way to check the [employer] shared e-mail.   No other information was found at this time. Hillary left a message for [name] to contact me Monday morning so we can get this solved asap.    I called the patient and updated her on the information we found out. She is aware that I will update once I have information from [place].

## 2014-07-30 NOTE — Telephone Encounter (Signed)
Please see phone note 06-28-14 for more information about this matter. Thanks.

## 2014-07-30 NOTE — Telephone Encounter (Signed)
Pt calling again asking status of [name] See phone note 06/28/14 Will send to Joellen Jersey has she has been following up on this.

## 2014-08-02 NOTE — Telephone Encounter (Signed)
Linda Kelly from [facility] contacted me this morning-states the approval process can take up to 2-3 weeks and the information was sent to upper management on 07-21-14; she will contact me with any decision prior to 08-04-14(2 weeks). Linda Kelly is also contacting the patient to keep her updated as well.

## 2014-08-19 NOTE — Telephone Encounter (Signed)
ATC Brenda at [facility] again-was placed on hold for more than 30 minutes and was unable to leave a message.

## 2014-08-26 NOTE — Telephone Encounter (Signed)
Called and was on hold for 51min for [facility]. WCB.

## 2014-08-30 NOTE — Progress Notes (Signed)
HPI: 80 yo for fu of carotid artery disease, CAD, HLD, pulmonary fibrosis. Her cardiac issues date back to 23 when she had an MI. She was treated with a balloon angioplasty at that time. Repeat cath July 1985 with patent RCA. She has been stable since then from a cardiac standpoint. Last stress myoview in October of 2010 showed inferior and inferolateral infarct but no significant ischemia. Ejection fraction was 60%. Renal dopplers 12/12 showed stable 1-59 bilateral renal artery stenosis. Previous carotid artery dopplers revealed high-grade greater than 80% stenosis in the right internal carotid artery, with moderate stenosis in the left in the 60-79% range. She does have retrograde flow in her left vertebral artery with monophasic flow in her brachial waveforms suggesting proximal subclavian occlusion and vertebral steal syndrome. CTA of the neck confirmed these findings. Patient had a right carotid endarterectomy in May 2015. Since last seen, She has some dyspnea on exertion but no orthopnea, PND, pedal edema, syncope or chest pain. Some bilateral lower extremity claudication with ambulation.   Current Outpatient Prescriptions  Medication Sig Dispense Refill  . aspirin EC 81 MG tablet Take 81 mg by mouth daily.    . cholecalciferol (VITAMIN D) 1000 UNITS tablet Take 1,000 Units by mouth daily.    . clopidogrel (PLAVIX) 75 MG tablet Take 1 tablet (75 mg total) by mouth daily. 90 tablet 1  . folic acid (FOLVITE) 852 MCG tablet Take 400 mcg by mouth daily.      Marland Kitchen loratadine (CLARITIN) 10 MG tablet Take 10 mg by mouth daily as needed for allergies.    . Omega-3 Fatty Acids (FISH OIL) 1000 MG CAPS Take 1,000 mg by mouth daily.     . ranitidine (ZANTAC) 150 MG tablet TAKE 1 TABLET BY MOUTH TWICE DAILY 180 tablet 3  . sertraline (ZOLOFT) 50 MG tablet TAKE 1 TABLET BY MOUTH ONCE DAILY 90 tablet 3  . simvastatin (ZOCOR) 40 MG tablet Take 1 tablet (40 mg total) by mouth daily. 90 tablet 3  .  vitamin E 400 UNIT capsule Take 400 Units by mouth daily.    . Pirfenidone ([name] PO) Take by mouth.     No current facility-administered medications for this visit.     Past Medical History  Diagnosis Date  . PVD (peripheral vascular disease)   . Hyperlipidemia   . CAD (coronary artery disease)   . Tinnitus     chronic  . Internal hemorrhoid   . IBS (irritable bowel syndrome)   . High frequency hearing loss   . Chronic gastritis   . Benign neoplasm of esophagus   . Hiatal hernia   . Colon, diverticulosis   . Carotid artery disease   . Other nonthrombocytopenic purpuras   . Solitary pulmonary nodule   . Idiopathic pulmonary fibrosis   . COPD (chronic obstructive pulmonary disease)   . Vertigo   . GERD (gastroesophageal reflux disease)   . Anxiety and depression   . MI (myocardial infarction) 1985  . Arthritis   . PONV (postoperative nausea and vomiting)   . Shortness of breath     with exertion  . Stroke     Hx: of several mini -strokes  . Depression   . Anxiety   . Cancer     Skin cancer on back    Past Surgical History  Procedure Laterality Date  . Foot surgery      bilateral  . Toe surgery      right foot  .  Carotid endarterectomy      left side x 3, saphenous vein graft from left leg  . Tonsillectomy    . Shoulder surgery      left  . Dental surgery      2012   . Angioplasty  1997, 98, 99  . Upper gastrointestinal endoscopy  11-27-01, 08-18-08    Hiatal hernia, benign esophagus neoplasia, gastritis, tortuous esophagus 50 Maloney dilation performed  . Colonoscopy  11-27-01, 10-30-05, 05-24-11    diverticulosis, hemorrhoids  . Colon surgery    . Cataract extraction w/ intraocular lens  implant, bilateral    . Breast surgery      Hx; of biopsy  . Tubal ligation    . Endarterectomy Right 03/03/2014    Procedure: RIGHT CAROTID ENDARTERECTOMY WITH PATCH ANGIOPLASTY;  Surgeon: Rosetta Posner, MD;  Location: [facility];  Service: Vascular;  Laterality: Right;     History   Social History  . Marital Status: Widowed    Spouse Name: N/A    Number of Children: 6  . Years of Education: N/A   Occupational History  . 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     Social History Main Topics  . Smoking status: Former Smoker -- 0.50 packs/day for 60 years    Types: Cigarettes    Quit date: 02/26/2014  . Smokeless tobacco: Never Used  . Alcohol Use: No  . Drug Use: No  . Sexual Activity: No   Other Topics Concern  . Not on file   Social History Narrative   [place] to [place] twice a year to visit sister- 3-4 weeks travel there every year.   Pt does not get regular exercise    ROS: no fevers or chills, productive cough, hemoptysis, dysphasia, odynophagia, melena, hematochezia, dysuria, hematuria, rash, seizure activity, orthopnea, PND, pedal edema. Remaining systems are negative.  Physical Exam: Well-developed well-nourished in no acute distress.  Skin is warm and dry.  HEENT is normal.  Neck is supple.  Chest Diffuse dry crackles Cardiovascular exam is regular rate and rhythm.  Abdominal exam nontender or distended. No masses palpated. Extremities show no edema. neuro grossly intact  ECG Sinus rhythm at a rate of 83. No ST changes. Left ventricular hypertrophy. Inferior infarct.

## 2014-08-30 NOTE — Telephone Encounter (Signed)
[name] from [facility] called back about patient-states that they need more information as far as a visit from 1 year ago stating patient declined on inhalers-needs to know more information about this matter. Did CY feels that patient should use the inhalers and patient did not or did he feel if she was fine without one. Also, with O2-patient told them she was on O2 short term after a surgical issue or does CY feel the patient needs O2 all the time.   [name] states we can call her back with any information.

## 2014-08-30 NOTE — Telephone Encounter (Signed)
Will forward to CY to help address this matter or see what other options we have.

## 2014-09-02 ENCOUNTER — Encounter: Payer: Self-pay | Admitting: Cardiology

## 2014-09-02 ENCOUNTER — Ambulatory Visit (INDEPENDENT_AMBULATORY_CARE_PROVIDER_SITE_OTHER): Payer: Medicare HMO | Admitting: Cardiology

## 2014-09-02 VITALS — BP 120/80 | HR 83 | Ht <= 58 in | Wt 147.0 lb

## 2014-09-02 DIAGNOSIS — I10 Essential (primary) hypertension: Secondary | ICD-10-CM

## 2014-09-02 DIAGNOSIS — I2510 Atherosclerotic heart disease of native coronary artery without angina pectoris: Secondary | ICD-10-CM

## 2014-09-02 DIAGNOSIS — I739 Peripheral vascular disease, unspecified: Secondary | ICD-10-CM

## 2014-09-02 NOTE — Assessment & Plan Note (Signed)
Patient describes some claudication. We discussed ABIs with Doppler today. However she thinks this is being planned at vascular surgery. She will contact us if she would like this performed here in the future.

## 2014-09-02 NOTE — Assessment & Plan Note (Signed)
Blood pressure controlled. 

## 2014-09-02 NOTE — Assessment & Plan Note (Signed)
Continue aspirin and statin. Follow by vascular surgery.

## 2014-09-02 NOTE — Patient Instructions (Signed)
Your physician wants you to follow-up in: ONE YEAR WITH DR CRENSHAW You will receive a reminder letter in the mail two months in advance. If you don't receive a letter, please call our office to schedule the follow-up appointment.  

## 2014-09-02 NOTE — Assessment & Plan Note (Signed)
Continue aspirin and statin. 

## 2014-09-02 NOTE — Assessment & Plan Note (Signed)
Continue statin. 

## 2014-09-09 ENCOUNTER — Other Ambulatory Visit: Payer: Self-pay | Admitting: Internal Medicine

## 2014-09-13 ENCOUNTER — Telehealth: Payer: Self-pay | Admitting: Internal Medicine

## 2014-09-15 NOTE — Telephone Encounter (Signed)
Please see phone note dated 06-28-14 regarding this matter. Thanks.

## 2014-09-15 NOTE — Telephone Encounter (Signed)
09/13/2014 10:26 AM Phone (Incoming) brenda [phone]    retruning call   Please keep message open as this has been a sensitive case in getting patient her medication through the [facility]. CY and I have been working closely with [name] at [facility]. Thanks.    I called [name] with [place] [facility] at [facility] and had to leave a message for her to call me back as she is with another client at the time of my call.

## 2014-09-15 NOTE — Telephone Encounter (Signed)
Spoke with Linda Kelly does not have any new updates and needs recs/suggestions from Linda Kelly. Linda Kelly aware that I will speak directly with Linda Kelly tomorrow morning and get her updated asap.

## 2014-09-15 NOTE — Telephone Encounter (Signed)
Linda Kelly, this message has been open since 8/31 Is there any update or can message be closed?  Thanks.

## 2014-09-16 NOTE — Telephone Encounter (Signed)
Spoke with CY regarding this Linda Kelly will let me know a good time today to call [name] and he will speak with her directly and give updated information.

## 2014-09-22 ENCOUNTER — Telehealth: Payer: Self-pay | Admitting: Cardiology

## 2014-09-22 DIAGNOSIS — I739 Peripheral vascular disease, unspecified: Secondary | ICD-10-CM

## 2014-09-22 NOTE — Telephone Encounter (Signed)
[name], please advise if you have spoken with CY about this. Thanks.

## 2014-09-22 NOTE — Telephone Encounter (Signed)
Left message for [facility] to call me

## 2014-09-22 NOTE — Telephone Encounter (Signed)
New message      Pt is seeing Dr Donnetta Hutching on 09-28-14.  She said Dr Stanford Breed want her to have an ultrasound on her legs.  Please send an order to Dr Early so that they can do the ultrasound.

## 2014-09-22 NOTE — Telephone Encounter (Signed)
CY and I have discussed this patient and he will contact [facility] today.

## 2014-09-22 NOTE — Telephone Encounter (Signed)
Spoke with [facility], ABI order placed and they will contact the patient with time.

## 2014-09-24 NOTE — Telephone Encounter (Signed)
Please advise if anything further is needed Dr. Annamaria Boots thanks

## 2014-09-27 ENCOUNTER — Encounter: Payer: Self-pay | Admitting: Vascular Surgery

## 2014-09-28 ENCOUNTER — Ambulatory Visit (INDEPENDENT_AMBULATORY_CARE_PROVIDER_SITE_OTHER)
Admission: RE | Admit: 2014-09-28 | Discharge: 2014-09-28 | Disposition: A | Discharge status: Home / Self Care | Payer: Medicare HMO | Source: Ambulatory Visit | Attending: Vascular Surgery | Admitting: Vascular Surgery

## 2014-09-28 ENCOUNTER — Ambulatory Visit (HOSPITAL_COMMUNITY)
Admission: RE | Admit: 2014-09-28 | Discharge: 2014-09-28 | Disposition: A | Discharge status: Home / Self Care | Payer: Medicare HMO | Source: Ambulatory Visit | Attending: Vascular Surgery | Admitting: Vascular Surgery

## 2014-09-28 ENCOUNTER — Ambulatory Visit (INDEPENDENT_AMBULATORY_CARE_PROVIDER_SITE_OTHER): Payer: Medicare HMO | Admitting: Vascular Surgery

## 2014-09-28 ENCOUNTER — Encounter: Payer: Self-pay | Admitting: Vascular Surgery

## 2014-09-28 ENCOUNTER — Other Ambulatory Visit: Payer: Self-pay | Admitting: Cardiology

## 2014-09-28 VITALS — BP 129/95 | HR 83 | Ht <= 58 in | Wt 146.5 lb

## 2014-09-28 DIAGNOSIS — I6523 Occlusion and stenosis of bilateral carotid arteries: Secondary | ICD-10-CM | POA: Diagnosis present

## 2014-09-28 DIAGNOSIS — I739 Peripheral vascular disease, unspecified: Secondary | ICD-10-CM

## 2014-09-28 DIAGNOSIS — I70213 Atherosclerosis of native arteries of extremities with intermittent claudication, bilateral legs: Secondary | ICD-10-CM | POA: Insufficient documentation

## 2014-09-28 DIAGNOSIS — Z48812 Encounter for surgical aftercare following surgery on the circulatory system: Secondary | ICD-10-CM | POA: Diagnosis not present

## 2014-09-28 NOTE — Progress Notes (Addendum)
HISTORY AND PHYSICAL     CC:  Follow up carotid duplex scan  Referring Provider:  Hendricks Limes, MD  HPI: This is a 80 y.o. female who has known carotid stenosis is here for f/u carotid duplex scan.  Denies amaurosis fugax, paresthesias, or hemiparesis.  She does have a hx of right CEA 03/03/14 and left CEA in 1995 with revisions in 1997 and 1999.    She does complain of leg pain with walking half a block.  This does improve with rest.  She states that the right is worse than the left.  She does not have any non healing wounds.   She states that she does water aerobics 3x/week and does not get any pain with this exercise.  She states that she does have some back aches.  She is on a statin for her hypercholesterolemia.  She is also on Plavix and aspirin.    Of note, her daughter did have a stroke this past week and is in [facility].  Past Medical History  Diagnosis Date  . PVD (peripheral vascular disease)   . Hyperlipidemia   . CAD (coronary artery disease)   . Tinnitus     chronic  . Internal hemorrhoid   . IBS (irritable bowel syndrome)   . High frequency hearing loss   . Chronic gastritis   . Benign neoplasm of esophagus   . Hiatal hernia   . Colon, diverticulosis   . Carotid artery disease   . Other nonthrombocytopenic purpuras   . Solitary pulmonary nodule   . Idiopathic pulmonary fibrosis   . COPD (chronic obstructive pulmonary disease)   . Vertigo   . GERD (gastroesophageal reflux disease)   . Anxiety and depression   . MI (myocardial infarction) 1985  . Arthritis   . PONV (postoperative nausea and vomiting)   . Shortness of breath     with exertion  . Stroke     Hx: of several mini -strokes  . Depression   . Anxiety   . Cancer     Skin cancer on back  . CHF (congestive heart failure)     Past Surgical History  Procedure Laterality Date  . Foot surgery      bilateral  . Toe surgery      right foot  . Carotid endarterectomy      left side x 3,  saphenous vein graft from left leg  . Tonsillectomy    . Shoulder surgery      left  . Dental surgery      2012   . Angioplasty  1997, 98, 99  . Upper gastrointestinal endoscopy  11-27-01, 08-18-08    Hiatal hernia, benign esophagus neoplasia, gastritis, tortuous esophagus 64 Maloney dilation performed  . Colonoscopy  11-27-01, 10-30-05, 05-24-11    diverticulosis, hemorrhoids  . Colon surgery    . Cataract extraction w/ intraocular lens  implant, bilateral    . Breast surgery      Hx; of biopsy  . Tubal ligation    . Endarterectomy Right 03/03/2014    Procedure: RIGHT CAROTID ENDARTERECTOMY WITH PATCH ANGIOPLASTY;  Surgeon: Rosetta Posner, MD;  Location: [facility] OR;  Service: Vascular;  Laterality: Right;    No Known Allergies  Current Outpatient Prescriptions  Medication Sig Dispense Refill  . aspirin EC 81 MG tablet Take 81 mg by mouth daily.    . cholecalciferol (VITAMIN D) 1000 UNITS tablet Take 1,000 Units by mouth daily.    . clopidogrel (  PLAVIX) 75 MG tablet Take 1 tablet (75 mg total) by mouth daily. 90 tablet 1  . folic acid (FOLVITE) 409 MCG tablet Take 400 mcg by mouth daily.      Marland Kitchen loratadine (CLARITIN) 10 MG tablet Take 10 mg by mouth daily as needed for allergies.    . Omega-3 Fatty Acids (FISH OIL) 1000 MG CAPS Take 1,000 mg by mouth daily.     . Pirfenidone ([name] PO) Take by mouth.    . ranitidine (ZANTAC) 150 MG tablet TAKE 1 TABLET BY MOUTH TWICE DAILY 180 tablet 3  . sertraline (ZOLOFT) 50 MG tablet TAKE 1 TABLET BY MOUTH ONCE DAILY 90 tablet 3  . simvastatin (ZOCOR) 40 MG tablet TAKE 1 TABLET BY MOUTH EVERY DAY 90 tablet 2  . vitamin E 400 UNIT capsule Take 400 Units by mouth daily.     No current facility-administered medications for this visit.    Pt's meds include: Statin:  Yes.   Beta Blocker:  No. Aspirin:  Yes.   Other antiplatelets/anticoagulants:  Yes.   Plavix   Family History  Problem Relation Age of Onset  . Heart attack Father     deceased at 64,  MGF  . Colon cancer Father   . Prostate cancer Father   . Heart disease Father   . Hyperlipidemia Father   . Heart disease Mother     deaceased at age 76  . Colitis Mother   . Stroke Mother   . Hyperlipidemia Mother   . Colitis Sister   . Hyperlipidemia Sister   . Other Brother     deceased age 6; war  . Hyperlipidemia Brother   . Breast cancer Maternal Aunt     great   . Lung cancer Paternal Aunt     great  . Vasculitis Son   . Hyperlipidemia Son   . Hypertension Son   . Heart attack Son   . COPD Neg Hx   . Asthma Neg Hx   . Cancer Sister     unknown  . Hyperlipidemia Sister   . Hyperlipidemia Daughter   . Hypertension Daughter     History   Social History  . Marital Status: Widowed    Spouse Name: N/A    Number of Children: 6  . Years of Education: N/A   Occupational History  . 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     Social History Main Topics  . Smoking status: Former Smoker -- 0.50 packs/day for 60 years    Types: Cigarettes    Quit date: 02/26/2014  . Smokeless tobacco: Never Used  . Alcohol Use: No  . Drug Use: No  . Sexual Activity: No   Other Topics Concern  . Not on file   Social History Narrative   [place] to [place] twice a year to visit sister- 3-4 weeks travel there every year.   Pt does not get regular exercise     ROS: [x]  Positive   [ ]  Negative   [ ]  All sytems reviewed and are negative  Cardiovascular: []  chest pain/pressure []  palpitations []  SOB lying flat []  DOE [x]  pain in legs while walking []  pain in feet when lying flat [x]  hx of DVT []  hx of phlebitis []  swelling in legs []  varicose veins  Pulmonary: []  productive cough []  asthma []  wheezing  Neurologic: [x]  weakness in arms or legs [x]  numbness in arms or legs [] difficulty speaking  or slurred speech []  temporary loss of vision in one eye []  dizziness [x]  c/o being off balance  Hematologic: []  bleeding  problems []  problems with blood clotting easily  GI []  vomiting blood []  blood in stool  GU: []  burning with urination []  blood in urine  Psychiatric: []  hx of major depression  Integumentary: []  rashes []  ulcers  Constitutional: []  fever []  chills   PHYSICAL EXAMINATION:  Filed Vitals:   09/28/14 1627  BP: 129/95  Pulse:    Body mass index is 30.63 kg/(m^2).  General:  WDWN in NAD Gait: Normal HENT: WNL; normocephalic Pulmonary: normal non-labored breathing , without Rales, rhonchi,  wheezing Cardiac: regular, without  Murmurs, rubs or gallops; without carotid bruits Abdomen: soft, NT, no masses Skin: without rashes,  without ulcers  Vascular Exam/Pulses: She does not have palpable pedal pulses on the left.  She does have a 2+ palpable right PT pulse. Extremities: without ischemic changes, without Gangrene , without cellulitis; without open wounds;  Musculoskeletal: without muscle wasting or atrophy  Neurologic: A&O X 3; Appropriate Affect ; SENSATION: normal; MOTOR FUNCTION:  moving all extremities equally. Speech is fluent/normal   Non-Invasive Vascular Imaging: Carotid Duplex Scan:  09/28/2014  -widely patent right CEA without evidence of restenosis or hyperplasia - Mild (<50%) soft plaque in the mid right CCA -patent left CEA with significant (>50%) stenosis at the proximal patch site.  There appears to be outpouching/focal ectasia at the proximal and distal patch -significant stenosis is observed in the left ECA -the right vertebral artery is antegrade but abnormal -the left vertebral artery is not visualized. -significant stenosis of the bilateral subclavian artery is present  ABI's 09/28/14: Right:  0.92 Left:  0.84  Previous ABI's 03/11/02 Right and left 1.0  ASSESSMENT: 80 y.o. female here for f/u carotid duplex scan as well as ABI's.   PLAN: -pt is asymptomatic from her carotid artery disease.  We will see her back in 6 months for repeat  duplex.  If this is okay at that time, we will go to one year duplex. -she does have what sounds like claudication symptoms, however, this is out of proportion to her ABI's from today.  Her symptoms could be coming from back issues or a combination with PAD.  She does have a palpable right PT This is not limb threatening.  She will continue to exercise and walk.  We will check ABI's in one year.   -continue statin/aspirin and Plavix -she also has issues with her equilibrium being off balance.  She states that she went to a neurologist who wanted to get a CT scan to look for microvascular disease.  The pt did not pursue this as this would not change her treatment plan.   Leontine Locket, PA-C [facility] [phone]  Clinic MD:   Pt seen and examined in conjunction with Dr. Donnetta Hutching   I have examined the patient, reviewed and agree with above. Regarding her carotid disease, she does have a widely patent endarterectomy on the right. She has a lot of irregularity in her left from her chronic carotid disease with no evidence of stenosis. Fortunately she is asymptomatic.  She does report pain and fatigue in her legs with walking. Her noninvasive studies are near normal. Certainly not convinced that this is related to arterial insufficiency but would require further evaluation such as CT angiogram to determine this. She currently is comfortable with observation only  EARLY, TODD, MD 09/28/2014 5:17 PM

## 2014-09-29 NOTE — Addendum Note (Signed)
Addended by: Mena Goes on: 09/29/2014 03:13 PM   Modules accepted: Orders

## 2014-09-29 NOTE — Telephone Encounter (Signed)
Linda Kelly can this message be signed off of?

## 2014-09-30 ENCOUNTER — Telehealth: Payer: Self-pay | Admitting: Internal Medicine

## 2014-09-30 NOTE — Telephone Encounter (Signed)
[name] please advise if you have any updates on pt's esbriet.

## 2014-10-01 ENCOUNTER — Telehealth: Payer: Self-pay | Admitting: Internal Medicine

## 2014-10-01 NOTE — Telephone Encounter (Signed)
Please see phone note dated 06-28-14 for further information regarding patient. Thanks.

## 2014-10-01 NOTE — Telephone Encounter (Signed)
"  Linda Kelly" [employer]- discussed Conservation officer, nature and justification for [name] to treat her pulmonary fibrosis. She indicated she would send on to [facility].

## 2014-10-01 NOTE — Telephone Encounter (Signed)
LMTCB for Linda Kelly on [employer] was at lunch at the time of my call.

## 2014-10-06 NOTE — Telephone Encounter (Signed)
Linda Kelly from [facility] spoke with CY last week and all needed information was given. Nothing more needed at this time.

## 2014-10-15 ENCOUNTER — Telehealth: Payer: Self-pay | Admitting: Internal Medicine

## 2014-10-15 DIAGNOSIS — J849 Interstitial pulmonary disease, unspecified: Secondary | ICD-10-CM

## 2014-10-15 MED ORDER — PIRFENIDONE 267 MG PO CAPS: ORAL_CAPSULE | ORAL | Status: DC

## 2014-10-15 NOTE — Telephone Encounter (Signed)
Ok to order [name]/pirfenidone:  # 270 caps, or as packaged, ref x 5 1 cap 3 x daily w meals x 7 days Then 2 caps 3 x daily x 7 days Then 3 caps daily with meals.  Monitor BMET and hepatic enzyme panel monthly= lab order

## 2014-10-15 NOTE — Telephone Encounter (Signed)
Pt aware [facility] will need [facility] and [facility] to her home or pharmacy and will contact her. Also, pt is aware that she will need to come by the office on Monday to have first set of labs drawn and then each month. Nothing more needed at this time.

## 2014-10-15 NOTE — Telephone Encounter (Signed)
Spoke with patient and she is aware of approval. Would like Rx sent to [facility]. CY is aware as well and will advise on Rx to send for patient.

## 2014-10-28 ENCOUNTER — Telehealth: Payer: Self-pay | Admitting: Internal Medicine

## 2014-10-28 NOTE — Telephone Encounter (Signed)
[facility] and they wanted to see if we can break the esbriet rx up They need 1 with the titration dose and 1 with the maintenance dose to be sent in to them.  CY please advise on how to break this up.  thanks

## 2014-11-04 NOTE — Telephone Encounter (Signed)
Message forwarded to [name] per her request - She is working on this

## 2014-11-05 MED ORDER — PIRFENIDONE 267 MG PO CAPS
ORAL_CAPSULE | ORAL | Status: DC
Start: 2014-11-05 — End: 2015-01-13

## 2014-11-05 NOTE — Telephone Encounter (Signed)
rx for the esbriet has been sent to [facility]. Nothing further is needed.

## 2014-11-05 NOTE — Telephone Encounter (Signed)
Per CY-lets give 1st Rx for [name] #207 (30 day supply) days 1-7 take 1 cap po TID with food, days 8-14 take 2 caps po TID with food, and day 15 on take 3 capsules po TID with food. Then give 2nd Rx for [name] #270 take 3 capsules po TID with food with 6 refills. Thanks.

## 2014-11-17 ENCOUNTER — Telehealth: Payer: Self-pay | Admitting: Internal Medicine

## 2014-11-17 NOTE — Telephone Encounter (Signed)
Called and spoke to [place] at [employer]. Joycelyn Schmid is requesting clarification on the titration order and maintenance dose of [name], gave verbal. Joycelyn Schmid is requesting an ICD code. Per pt's chart, idiopathic interstitial pneumonia. Joycelyn Schmid unsure if this will be covered by insurance, primarily IPF is the diagnosis of choice for this drug. Informed Joycelyn Schmid to [place] order until we are able to link a correct diagnosis.   Dr. Annamaria Boots please advise if IPF can be used in association [name].

## 2014-11-18 NOTE — Telephone Encounter (Signed)
Called [facility]- gave new ICD-10 code  Spoke with Cephus Slater to verify insurance coverage.  Medication is being filed under [employer].  Insurance information given for [place] ID# [id number] Group# [id number] RxPCN# [id number] RxBin# [id number] Benefits have been updated and this is being sent to Benefit Verification Team to verify coverage. Once verified they will contact the patient to confirm shipment information.  Nothing further needed.

## 2014-11-18 NOTE — Telephone Encounter (Signed)
Also, Please make sure that this med is going through [facility] patient has.

## 2014-11-18 NOTE — Telephone Encounter (Signed)
I have modified the diagnosis on her problem list to display adding UIP

## 2014-12-06 ENCOUNTER — Telehealth: Payer: Self-pay | Admitting: Internal Medicine

## 2014-12-06 NOTE — Telephone Encounter (Signed)
Pt uses [facility] for [employer] [facility]. Pt states she has not heard from them as of today. States she never received the first shipment since [facility] approved patient to have medication. Pt is aware that I will personally take care of this for her as I remember the conversation with [facility] team and make sure things are being communicated between [facility] and [employer].   Virl Cagey, CMA at 11/18/2014 11:14 AM     Status: Signed       Expand All Collapse All   Called [facility]- gave new ICD-10 code  Spoke with Cephus Slater to verify insurance coverage.  Medication is being filed under [employer].  Insurance information given for [place] ID# [id number] Group# [id number] RxPCN# [id number] RxBin# [id number] Benefits have been updated and this is being sent to Benefit Verification Team to verify coverage. Once verified they will contact the patient to confirm shipment information.  Nothing further needed.

## 2014-12-10 NOTE — Telephone Encounter (Signed)
Has this been taken care of?

## 2014-12-15 ENCOUNTER — Encounter: Payer: Self-pay | Admitting: Cardiology

## 2014-12-16 NOTE — Telephone Encounter (Signed)
Please advise if this has been taken care of. Thanks.  

## 2014-12-21 NOTE — Telephone Encounter (Signed)
[name] please advise on the status of this patient's esbriet status.

## 2014-12-24 NOTE — Telephone Encounter (Signed)
CY apparently patient continues to have trouble with her insurance and the [facility] insurance on getting her [name]. She has not received this Rx yet; do you want to look at another option for patient or I can continue to work closely with [name] at the [facility] blue team on getting patients medication. Thanks.

## 2014-12-28 ENCOUNTER — Ambulatory Visit (INDEPENDENT_AMBULATORY_CARE_PROVIDER_SITE_OTHER): Payer: Commercial Managed Care - PPO | Admitting: Internal Medicine

## 2014-12-28 ENCOUNTER — Encounter: Payer: Self-pay | Admitting: Internal Medicine

## 2014-12-28 VITALS — BP 122/74 | HR 94 | Ht <= 58 in | Wt 154.2 lb

## 2014-12-28 DIAGNOSIS — J84111 Idiopathic interstitial pneumonia, not otherwise specified: Secondary | ICD-10-CM

## 2014-12-28 DIAGNOSIS — J439 Emphysema, unspecified: Secondary | ICD-10-CM

## 2014-12-28 DIAGNOSIS — J84112 Idiopathic pulmonary fibrosis: Secondary | ICD-10-CM

## 2014-12-28 DIAGNOSIS — I739 Peripheral vascular disease, unspecified: Secondary | ICD-10-CM

## 2014-12-28 DIAGNOSIS — J8410 Pulmonary fibrosis, unspecified: Secondary | ICD-10-CM

## 2014-12-28 NOTE — Telephone Encounter (Signed)
Please keep working for her [place] and please keep Mrs Sobh "in the loop" about where that stands.

## 2014-12-28 NOTE — Assessment & Plan Note (Signed)
Low DLCO on previous PFT may be from emphysema and/ or UIP She is not needing bronchodilators and is not yet at a point of needing O2.

## 2014-12-28 NOTE — Progress Notes (Signed)
Patient ID: Linda Kelly, female    DOB: [birth year], 81 y.o.   MRN: [id number]  HPI 06/07/11- 28 yoF 1/2 PPD smoker followed for pulmonary fibrosis/ ILD, COPD complicated by CAD,PAD hx lung nodule, hx [place]/ [place] exposure with long term f/u here.  Still says she is trying to cut back on her smoking "trying". Been having GI complaints- Dr Carlean Purl colonoscopy. She admits tiredness overlapping with shortness of breath. She will nap and put her feet up. Not much cough- scant phlegm, no blood or purulent. Doing water aerobics 3 days/ week. Last here October 10, 2010 CXR 10/10/10- unchanged basilar fibrotic scarring- reviewed.   12/11/11-  79 yoF 1/2 PPD smoker followed for pulmonary fibrosis/ ILD, COPD complicated by CAD,PAD hx lung nodule, hx [place]/ [place] exposure with long term f/u here.  Now being followed for [place]/ [place] dust exposure at [place]- they did CXR and PFT recently.  She has felt "much better" although continues to smoke against advice. At "Y" 3 days/ week.Little cough or sputum. No pain, blood, nodes, fever. CXR- 11/06/11- stable mild interstitial prominence and interstitial fibrotic changes at bases.  PFT-06/04/11- FEV1 1.26/96%, FEV1/FVC 0.9 . Slight response to bronchodilator with normal flows. Residual volume 120%, DLCO 34% which is severely reduced.  02/04/13- 69 yoF 1/2 PPD smoker followed for pulmonary fibrosis/ ILD, COPD complicated by CAD,PAD hx lung nodule, hx [place]/ [place] exposure with long term f/u here.  Now being followed for [place]/ [place] dust exposure. She is still followed by occupational health but brings her extensive set of update forms here. Still smoking one half pack per day with no effort to stop. Continues water aerobics 3 times per week. Slight transient burning sensation in mid chest occasionally, relieved by resting for a while but not clearly exertional. 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 Aware of frequent heartburn and reflux.[facility] Program days for Zantac and Zoloft. Occasional wheeze. Little cough, dry. She does not feel she needs a metered inhaler. CXR 09/14/12 IMPRESSION:  Borderline cardiac size. Chronic interstitial fibrotic infiltrate  densities are again evident. No definite acute superimposed  process is identified. There is central peribronchial thickening.  No pleural effusion is evident.  Original Report Authenticated By: Shanon Brow Call   04/06/13- 82 yoF 1/2 PPD smoker followed for pulmonary fibrosis/ ILD, COPD complicated by CAD,PAD hx lung nodule, hx [place]/ [place] exposure with long term f/u here.  FOLLOWS FOR: Breathing is unchanged. Here to review PFT and CT results. Less cough now than she had during the winter. Continues to smoke against advice and this was discussed again. Has had some sneezing rhinitis symptoms with the spring pollen. Admits feeling tired today. Denies chest pain, edema, discolored sputum, fever or lymph nodes. CT chest 02/20/13 IMPRESSION:  1. Mild progression of interstitial fibrosis with an appearance  most compatible with usual interstitial pneumonia.  2. Small ascending aortic aneurysm is slightly increased in size  since the prior study.  3. Calcific aortic and coronary atherosclerosis.  Original Report Authenticated By: Orlean Patten, M.D. PFT 04/21/13- spirometry within normal limits lung volumes within normal limits. Severe decrease in diffusion capacity. Insignificant response to bronchodilator.FVC 1.60/86%, FEV1 1.37/116%, FEV1/FVC 0.86, FEF 25-75% 2.10/133%. TLC 94%, DLCO 43%.  08/06/13- 40 yoF 1/2 PPD smoker followed for pulmonary fibrosis/ ILD, COPD complicated by CAD,PAD hx lung nodule, hx [place]/ [place] exposure with long term  f/u here.  FOLLOWS FOR:  Breathing slightly worse since last OV.  SOB increased with exertion and coughing w/ clear mucus She continues  to smoke against all efforts and advice, and is not trying to quit. Admits maybe a little easier dyspnea on exertion, not much cough. Recognized one episode of reflux, takes omeprazole. Occasional chest tightness, nonradiating, no pain, not clearly exertional.  02/04/14- 55 yoF 1/2 PPD smoker followed for pulmonary fibrosis/ ILD, COPD complicated by CAD,PAD hx lung nodule, hx [place]/ [place] exposure with long term f/u here. FOLLOWS FOR: Pt states her breathing is about the same as last visit; Activity causes her to have SOB. Less cough lately than in past. Little phlegm. No CP or palpitation. Was seen for [facility] f/u labs this AM by [facility]/ Dr Geoffry Paradise- they sent her here w/ EKG. She does not see their physician. Labs are forwarded to [facility] claims center. EKG 02/04/14- small inferior Q's, no acute. Mentions episode L hand numbness 2 weeks ago, lasted most of day. No syncope, blurring, HA. Continues BASA. Hx L CEA years ago/ Dr Donnetta Hutching. PFT-02/19/13- Normal flows, insignif response to BD, severely reduced DLCO. FVC 1.60/ 86%, FEV1 1.37/ 116%, FEV1/FVC 0.86, FEF25-75% 2.10/ 133%. TLC 94%, DLCO 43%. CT 02/20/13 IMPRESSION:  1. Mild progression of interstitial fibrosis with an appearance  most compatible with usual interstitial pneumonia.  2. Small ascending aortic aneurysm is slightly increased in size  since the prior study.  3. Calcific aortic and coronary atherosclerosis.  Original Report Authenticated By: Orlean Patten, M.D. CXR 02/04/14 IMPRESSION:  COPD with scarring.  Small left pleural effusion.  Electronically Signed  By: Franchot Gallo M.D.  On: 02/04/2014 09:40  03/18/14- 45 yoF 1/2 PPD smoker followed for pulmonary fibrosis/ ILD/UIP, COPD complicated by CAD/ hx MI,PAD hx lung nodule, hx [place]/ [place] dust exposure with long term f/u here. FOLLOWS FOR: uses her O2 about 3/4 the time only because someone is "nagging" her to use it. Pt feels like she does not  need the O2. Pt states her breathing is fine.    Daughter is here Walk Test on room air 03/18/14-  23 m, lowest 92% Had R carotid endarterectomy/ Dr Donnetta Hutching Denies significant cough, wheeze, phlegm, chest pain, palpitation.  [place] for [facility] f/u-02/04/14- EKG NSR, old INF MI, CMET (incl LFT, renal, glu) wnl Cr 0.74, CBC wnl.   Labs scanned to media. Spirometry 02/04/14- WNL FVC 1.47/ 88%, FEV1 1.19/ 98%, FEV1/FVC 0.73, FEF25-75% 1.42/ 162%   05/18/14- 85 yoF former smoker followed for pulmonary fibrosis/ ILD/UIP, COPD complicated by CAD/ hx MI,PAD hx lung nodule, hx [place]/ [place] dust exposure with long term f/u here. FOLLOWS FOR:  Pt revieved [name] and would like to discuss starting.  Breathing doing well, reports has been off balance over the past few months O2 was dc'd in May after [place]. Oxygenation has improved a bit since she quit smoking. Little routine cough or wheeze. Dyspnea noted with exertion, but she is less active since cautious about feeling unsteady. We again discussed [name] side effects/ GI. She has contact for their patient support program. She states she is willing to start and has her first shipment.  06/29/14- 56 yoF former smoker followed for pulmonary fibrosis/ ILD/UIP, COPD complicated by CAD/ hx MI,PAD hx lung nodule, dizziness, hx [place]/ [place] dust exposure with long term f/u here. Working on [employer] approval w/ [employer]. She completed 270  tab starter set- well tolerated up to 3 tabs TID, running out one week ago., but we think [facility] is hold-up for continuing Rx.  Has sun tan lotion .Had neurology workup for dizziness. Also saw ENT. MRI scan was recommended but she wanted to wait to see if the dizziness would get worse. No loss of hearing and no headache. Taking swim classes for exercise  12/28/14- 86 yoF former smoker followed for pulmonary fibrosis/ ILD/UIP, COPD complicated by CAD/ hx MI,PAD hx lung nodule, dizziness,  hx [place]/ [place] dust exposure with long term f/u here. FOLLOWS FOR:sob with exertion slightly worse,does fine sitting still,no wheezing,no cough,denies cp or tightness.Wants to know status of [name] process..Not taking yet. [name] formal start has been on hold because of insurance delay - being worked on. Walk Test 12/28/14- 2 laps x 185 ft, desat to 90% on room air, stopped because "tired" Easier fatigue walking- primarily because her thighs begin to ache, even walking to the dumpster. Little cough or wheeze.  Review of Systems-see HPI Constitutional:   No-   weight loss, night sweats, fevers, chills,+ fatigue, lassitude. HEENT:   No-  headaches, difficulty swallowing, tooth/dental problems, sore throat,       No-  sneezing, itching, ear ache, nasal congestion, post nasal drip,  CV:  No-   chest pain, orthopnea, PND, swelling in lower extremities, anasarca, dizziness, palpitations Resp: +shortness of breath with exertion or at rest.              No-   productive cough,   non-productive cough,  No-  coughing up of blood.              No-   change in color of mucus.  No- wheezing.   Skin: No-   rash or lesions. GI:  + heartburn, indigestion, no-abdominal pain, nausea, vomiting,  GU: MS:  No-   joint pain or swelling.  . Neuro- + dizziness Psych:  No- change in mood or affect. No depression or anxiety.  No memory loss.  Objective:   Physical Exam General- Alert, Oriented, Affect-appropriate, Distress- none acute, looks well. Skin- rash-none, lesions- none, excoriation- none Lymphadenopathy- none Head- atraumatic            Eyes- Gross vision intact, PERRLA, conjunctivae clear secretions            Ears- Hearing, canals normal            Nose- Clear, No-Septal dev, mucus, polyps, erosion, perforation             Throat- Mallampati II , mucosa clear , drainage- none, tonsils- atrophic;  dentures Neck- flexible , trachea midline, no stridor , thyroid nl, carotid +R  bruit Chest - symmetrical excursion , unlabored           Heart/CV- RRR , no murmur , no gallop  , no rub, nl s1 s2                           - JVD- none , edema- none, stasis changes- none, varices- none           Lung- +crackles to mid chest, unlabored at rest,  wheeze- none, cough- none , dullness-none, rub- none 96% RA           Chest wall-  Abd-  Br/ Gen/ Rectal- Not done, not indicated Extrem- cyanosis- none, clubbing- none, atrophy- none, strength- nl Neuro- +seems alert and  sharp at this visit

## 2014-12-28 NOTE — Assessment & Plan Note (Signed)
Our office is working with her coverage to try to get the [place] started. On walk test today she stopped for fatigue, with thighs just beginning to ache and sat 90% on RA. Plan- High Res CT chest to track progression of disease over past year.

## 2014-12-28 NOTE — Patient Instructions (Signed)
Order - schedule CT chest, high definition, no contrast  Dx UIP/ pulmonary fibrosis

## 2014-12-28 NOTE — Assessment & Plan Note (Signed)
Her thigh pain with exertion sounds like claudication.  Plan- Encourage her to follow up with cardiology, especially if leg pain is getting worse.

## 2014-12-29 NOTE — Telephone Encounter (Signed)
Called and left message for Linda Kelly at [phone].   [name] please advise if you have any updates.

## 2015-01-03 ENCOUNTER — Other Ambulatory Visit: Payer: Self-pay

## 2015-01-03 MED ORDER — CLOPIDOGREL BISULFATE 75 MG PO TABS: 75.0000 mg | ORAL_TABLET | Freq: Every day | ORAL | Status: DC

## 2015-01-03 NOTE — Telephone Encounter (Signed)
Called [facility] insurance Patient ID# [id number] Left message for representative to call back.  Unable to get through to a live person to check status of PA.  This was initiated 11/18/2014 and have not heard anything in return regarding approval.  [name] please advise if you have heard anything regarding the status.  PA may need to be repeated. Please let triage know something soon so that we may get this handled in a timely manner for the patient.  -------- Spoke with patient, aware of the above.  [name] please advise. Thanks.

## 2015-01-04 ENCOUNTER — Ambulatory Visit (INDEPENDENT_AMBULATORY_CARE_PROVIDER_SITE_OTHER)
Admission: RE | Admit: 2015-01-04 | Discharge: 2015-01-04 | Disposition: A | Discharge status: Home / Self Care | Payer: Medicare HMO | Source: Ambulatory Visit | Attending: Internal Medicine | Admitting: Internal Medicine

## 2015-01-04 DIAGNOSIS — J8410 Pulmonary fibrosis, unspecified: Secondary | ICD-10-CM

## 2015-01-04 DIAGNOSIS — J84112 Idiopathic pulmonary fibrosis: Secondary | ICD-10-CM

## 2015-01-05 ENCOUNTER — Telehealth: Payer: Self-pay | Admitting: Internal Medicine

## 2015-01-05 NOTE — Telephone Encounter (Signed)
Notes Recorded by Deneise Lever, MD on 01/04/2015 at 8:02 PM CT chest- stable. Very little change in pulmonary fibrosis since 2013. Atherosclerosis in the arteries also seems about the same. -- Attempted to call pt. No answer, no option to leave a message. Will try back.

## 2015-01-06 NOTE — Telephone Encounter (Signed)
[name] please advise if you have heard anything or received anything on this patient regarding PA for [name]. Thanks.

## 2015-01-06 NOTE — Telephone Encounter (Signed)
Results have been explained to patient, pt expressed understanding. Nothing further needed.  

## 2015-01-10 ENCOUNTER — Telehealth: Payer: Self-pay | Admitting: Internal Medicine

## 2015-01-10 NOTE — Telephone Encounter (Signed)
Number provided is incorrect, correct number is [id number] [id number] Spoke with Clarene Critchley at [employer], approved through 10/29/2015.   A letter will be sent to [facility] and the pt as well.  Nothing further needed.

## 2015-01-11 ENCOUNTER — Telehealth: Payer: Self-pay | Admitting: Internal Medicine

## 2015-01-11 NOTE — Telephone Encounter (Signed)
This has already been taken care of

## 2015-01-11 NOTE — Telephone Encounter (Signed)
Spoke with [name] earlier this AM. Will route message to her per her request.

## 2015-01-11 NOTE — Telephone Encounter (Signed)
See phone note 3/14. This was approved. Will sign off

## 2015-01-13 ENCOUNTER — Telehealth: Payer: Self-pay | Admitting: Internal Medicine

## 2015-01-13 ENCOUNTER — Encounter: Payer: Self-pay | Admitting: Internal Medicine

## 2015-01-13 ENCOUNTER — Ambulatory Visit (INDEPENDENT_AMBULATORY_CARE_PROVIDER_SITE_OTHER)
Admission: RE | Admit: 2015-01-13 | Discharge: 2015-01-13 | Disposition: A | Discharge status: Home / Self Care | Payer: Medicare HMO | Source: Ambulatory Visit | Attending: Internal Medicine | Admitting: Internal Medicine

## 2015-01-13 ENCOUNTER — Ambulatory Visit (INDEPENDENT_AMBULATORY_CARE_PROVIDER_SITE_OTHER): Payer: Medicare HMO | Admitting: Internal Medicine

## 2015-01-13 DIAGNOSIS — M5416 Radiculopathy, lumbar region: Secondary | ICD-10-CM

## 2015-01-13 MED ORDER — GABAPENTIN 100 MG PO CAPS: ORAL_CAPSULE | ORAL | Status: DC

## 2015-01-13 NOTE — Progress Notes (Signed)
   Subjective:    Patient ID: Linda Kelly, female    DOB: June 04, 1928, 81 y.o.   MRN: [id number]  HPI  She has had symptoms for one month of pain in the right lumbosacral area/buttock with radiation to the right thigh. It is intermittent but has been occurring daily. It is worse with standing and walking. It does improve with sitting or while supine . It can be up to level VIII on a 10 scale.At this time it not present.It is described as aching in character and lasts minutes. Advil 2-3 tablets have provided some relief.  She does describe some muscle discomfort in the lower legs occasionally.  She describes urinary urgency which can result in incontinence if she cannot get to the bathroom. She has no frank loss of bladder control. She has no other neuromuscular, GI, or genitourinary symptoms.  Review of Systems   Chest pain, palpitations, tachycardia, exertional dyspnea, paroxysmal nocturnal dyspnea, claudication or edema are absent. Unexplained weight loss, abdominal pain, significant dyspepsia, dysphagia, melena, rectal bleeding, or persistently small caliber stools are denied. Fever, chills, sweats not present. No significant headaches. Mental status change or memory loss denied. Blurred vision , diplopia or vision loss absent. Vertigo, near syncope or imbalance denied. There is no numbness, tingling, or weakness in extremities.   No loss of control of bladder or bowels. Radicular type pain absent. No seizure stigmata. No associated rash in area of pain.     Objective:   Physical Exam  Pertinent or positive findings include: She appears younger than stated age .  She has an S4.  Rales are present at the bases, greater in the left lower lobe than the right.  Pedal pulses are decreased ,especially dorsalis pedis pulse. She is able to lie flat and sit up without help.  Straight leg raising is negative to 75.  General appearance :adequately nourished; in no distress. Eyes: No  conjunctival inflammation or scleral icterus is present. Heart:  Normal rate and regular rhythm. S1 and S2 normal without gallop, murmur, click, or rub. Lungs:No increased work of breathing.  Abdomen: bowel sounds normal, soft and non-tender without masses, organomegaly or hernias noted.  No guarding or rebound. No flank tenderness to percussion. Vascular : all pulses equal ; L carotid  Bruit is present. Skin:Warm & dry.  Intact without suspicious lesions or rashes ; no tenting or jaundice  Lymphatic: No lymphadenopathy is noted about the head, neck, axilla Neuro: Strength, tone & DTRs normal.        Assessment & Plan:   #1 right L 2 radiculopathy most likely related to spinal stenosis  Plan: See orders and recommendations

## 2015-01-13 NOTE — Progress Notes (Signed)
Pre visit review using our clinic review tool, if applicable. No additional management support is needed unless otherwise documented below in the visit note. 

## 2015-01-13 NOTE — Patient Instructions (Signed)
  Your next office appointment will be determined based upon review of your pending  x-rays. Those instructions will be transmitted to you through My Chart   Critical values will be called. Followup as needed for any active or acute issue. Please report any significant change in your symptoms.   Assess response to the gabapentin one every 8 hours as needed. If it is partially beneficial, it can be increased up to a total of 3 pills every 8 hours as needed. This increase of 1 pill each dose  should take place over 72 hours at least.If 300 mg is effective dose ; there is a 300 mg pill.The best exercises for the low back include freestyle swimming, stretch aerobics, and yoga.

## 2015-01-13 NOTE — Telephone Encounter (Signed)
Spoke with Linda Kelly at the [place]  She states that she spoke with Linda Kelly, a rep at [employer] and he advised that we never submitted any PA for this pt  In the PN dated 01/11/15 KW stated this was done  Linda Kelly- do you still have the PA forms or know anything about this? Please advise, thanks

## 2015-01-17 ENCOUNTER — Encounter: Payer: Self-pay | Admitting: Internal Medicine

## 2015-01-17 NOTE — Telephone Encounter (Signed)
This is a Dr. Annamaria Boots pt.

## 2015-01-17 NOTE — Telephone Encounter (Signed)
Len Blalock, CMA at 01/10/2015 2:28 PM     Status: Signed       Expand All Collapse All   Number provided is incorrect, correct number is [phone] Spoke with Clarene Critchley at [employer], approved through 10/29/2015.  A letter will be sent to [facility] and the pt as well. Nothing further needed.

## 2015-01-17 NOTE — Telephone Encounter (Signed)
Christine from [facility] has called back. She states there is not a prior auth on file [id number]

## 2015-01-17 NOTE — Telephone Encounter (Signed)
Spoke with Altha Harm with [facility]. Advised of below regarding [name] PA Approval.  Per Altha Harm, she is still receiving a rejection msg.  States she will call insurance company to see what is going on and will call us back with an update.  Will await call back from [place].

## 2015-01-17 NOTE — Telephone Encounter (Signed)
[facility]-PA needs to be on file for them to cover but it shows coverage approval through 10-29-15. The rep looked through her files and found a PA was done via phone call recently and there is approval for patients medication. Nothing more is needed from our end.   I spoke with patient as well-she is aware of the approval. Pt will call her mail in pharmacy to have them mail the Rx to you. If any troubles with this or she does not get the Rx then she will call me back.   This medication should be coming through her [facility] claims and not her regular insurance.

## 2015-01-17 NOTE — Telephone Encounter (Signed)
[name], do you have the letter of approval for the Linda Kelly so we can [facility] to [facility]?  Please advise.

## 2015-01-17 NOTE — Telephone Encounter (Signed)
[name], do you have a letter of approval for [place]?

## 2015-01-18 ENCOUNTER — Telehealth: Payer: Self-pay | Admitting: Internal Medicine

## 2015-01-18 NOTE — Telephone Encounter (Signed)
Patient has been advised of her xray results per Dr Linna Darner comments on her report

## 2015-01-18 NOTE — Telephone Encounter (Signed)
Would like a follow up call in regards to results of recent xray

## 2015-01-27 ENCOUNTER — Telehealth: Payer: Self-pay | Admitting: Internal Medicine

## 2015-01-27 NOTE — Telephone Encounter (Signed)
Called home number- NA and no option to leave msg  LMTCB on mobile

## 2015-01-28 NOTE — Telephone Encounter (Signed)
Spoke with patient-states she has not received her [name] Rx as of today. Was told by Thedora Hinders and [facility] pharmacy that her case was closed due to being told CY would change her medication to [place] back 06-2014. I do not see anything nor recall ANY conversation with CY to change patients med. We sought a PA for [name] for patient in 06-2014 and were told by [place] and [facility] insurance for patient) that the [place] was approved.   I called [employer] and was told the case was closed since [facility] took over the case for patient and no [name] would be sent due to being told CY was changing her to [facility]. Thedora Hinders is aware of the situation and requests since no change was made nor should be changed to start the process again-the PAN and SMN forms have changed since patient started the initial process.    I have all forms on my desk to complete and have patient print her name, sign her name, and date next week.  I left a message for patient to contact me after 8:30am Monday 01-31-15 so she and I could arrange a time to meet in the office. I left message on patients cell number as no answer at her home number and unable to leave a message.  Will hold in my basket until taken care of.

## 2015-01-28 NOTE — Telephone Encounter (Signed)
Forwarding to Joellen [place] to make her aware.

## 2015-01-28 NOTE — Telephone Encounter (Signed)
Called to let us know medicine has been approved and is sched for delivery on 4/4

## 2015-01-28 NOTE — Telephone Encounter (Signed)
Spoke with pt. States that Linda Kelly was working on getting her medicine free. She does not know what medication. Will route to [name] to follow up on.

## 2015-01-31 ENCOUNTER — Other Ambulatory Visit: Payer: Self-pay | Admitting: Internal Medicine

## 2015-01-31 ENCOUNTER — Other Ambulatory Visit (INDEPENDENT_AMBULATORY_CARE_PROVIDER_SITE_OTHER): Payer: Medicare HMO

## 2015-01-31 DIAGNOSIS — J849 Interstitial pulmonary disease, unspecified: Secondary | ICD-10-CM | POA: Diagnosis not present

## 2015-01-31 LAB — BASIC METABOLIC PANEL
BUN: 17 mg/dL (ref 6–23)
CO2: 33 mEq/L — ABNORMAL HIGH (ref 19–32)
Calcium: 9.5 mg/dL (ref 8.4–10.5)
Chloride: 103 mEq/L (ref 96–112)
Creatinine, Ser: 1.11 mg/dL (ref 0.40–1.20)
GFR: 49.74 mL/min — AB (ref >60.00)
Glucose, Bld: 88 mg/dL (ref 70–99)
POTASSIUM: 4 meq/L (ref 3.5–5.1)
Sodium: 141 mEq/L (ref 135–145)

## 2015-01-31 LAB — HEPATIC FUNCTION PANEL
ALBUMIN: 4.1 g/dL (ref 3.5–5.2)
ALT: 13 U/L (ref 0–35)
AST: 17 U/L (ref 0–37)
Alkaline Phosphatase: 76 U/L (ref 39–117)
BILIRUBIN DIRECT: 0.1 mg/dL (ref 0.0–0.3)
TOTAL PROTEIN: 7.2 g/dL (ref 6.0–8.3)
Total Bilirubin: 0.5 mg/dL (ref 0.2–1.2)

## 2015-01-31 NOTE — Telephone Encounter (Signed)
Pt came by the office today-she told me that the medication should be delivered to her home today-under these circumstances we will hold off on any new papers being signed as [facility] has taken over patients care for [place]. Pt is aware to contact me if she did not get her medication today.   Will close off on message and create new one if needed.

## 2015-02-03 NOTE — Progress Notes (Signed)
Quick Note:  lmtcb for pt. ______ 

## 2015-02-04 ENCOUNTER — Telehealth: Payer: Self-pay | Admitting: Internal Medicine

## 2015-02-04 NOTE — Telephone Encounter (Signed)
Notes Recorded by Virl Cagey, CMA on 02/04/2015 at 10:51 AM Results have been explained to patient, pt expressed understanding. Nothing further needed.  Notes Recorded by Maurice March, RN on 02/03/2015 at 11:10 AM lmtcb for pt. Notes Recorded by Deneise Lever, MD on 02/01/2015 at 4:40 PM Liver function and basic chemistry test are ok for [name]  Nothing further needed.

## 2015-02-22 ENCOUNTER — Telehealth: Payer: Self-pay | Admitting: Internal Medicine

## 2015-02-22 NOTE — Telephone Encounter (Signed)
Suggest she drop back to taking 3 tabs, twice daily

## 2015-02-22 NOTE — Telephone Encounter (Signed)
Pt is currently at 3 tablets TID for about 1 week; she has been dizzy and sleepy-unable to function. Pt states she stopped taking them yesterday. Pt states when she was on 1 TID she did well and no side effects.   Dr Lenna Gilford please advise as CY is out of office. Thanks.

## 2015-02-23 NOTE — Telephone Encounter (Signed)
Attempted to contact pt. She answered the phone but could not hear me. Will try back.

## 2015-02-24 NOTE — Telephone Encounter (Signed)
ATC, NA wcb

## 2015-02-25 NOTE — Telephone Encounter (Signed)
Pt aware of recs.  Nothing further needed. 

## 2015-03-22 ENCOUNTER — Telehealth: Payer: Self-pay | Admitting: Internal Medicine

## 2015-03-22 NOTE — Telephone Encounter (Signed)
These new problems are more like pollen allergy and less likely related to her [name]. Suggest she try an otc antihistamine like Allegra or Claritin. It may also help to use otc nasal spray Flonase/ fluticasone.

## 2015-03-22 NOTE — Telephone Encounter (Signed)
Spoke with pt, states that her Jacklynn Bue has been reduced to 3 tabs bid for approx 3 weeks- states that she is tolerating this much better, but is having allergy symptoms as well.  C/o runny nose, eye pain, sore throat, PND, throat clearing X2 weeks.  Denies fever, chest congestion.   [facility] [address]  Dr. Annamaria Boots please advise on recs.  Thanks!  No Known Allergies Current Outpatient Prescriptions on File Prior to Visit  Medication Sig Dispense Refill  . aspirin EC 81 MG tablet Take 81 mg by mouth daily.    . cholecalciferol (VITAMIN D) 1000 UNITS tablet Take 1,000 Units by mouth daily.    . clopidogrel (PLAVIX) 75 MG tablet Take 1 tablet (75 mg total) by mouth daily. 90 tablet 1  . folic acid (FOLVITE) 696 MCG tablet Take 400 mcg by mouth daily.      Marland Kitchen gabapentin (NEURONTIN) 100 MG capsule One pill every eight hours as needed; dose may be increased by one pill each dose after 72 hours if only partially effective 30 capsule 2  . loratadine (CLARITIN) 10 MG tablet Take 10 mg by mouth daily as needed for allergies.    . Omega-3 Fatty Acids (FISH OIL) 1000 MG CAPS Take 1,000 mg by mouth daily.     . ranitidine (ZANTAC) 150 MG tablet TAKE 1 TABLET BY MOUTH TWICE DAILY 180 tablet 3  . sertraline (ZOLOFT) 50 MG tablet TAKE 1 TABLET BY MOUTH ONCE DAILY 90 tablet 3  . simvastatin (ZOCOR) 40 MG tablet TAKE 1 TABLET BY MOUTH EVERY DAY 90 tablet 2  . vitamin E 400 UNIT capsule Take 400 Units by mouth daily.     No current facility-administered medications on file prior to visit.

## 2015-03-22 NOTE — Telephone Encounter (Signed)
Pt is aware of CY's recommendations. Nothing further was needed. 

## 2015-03-25 ENCOUNTER — Other Ambulatory Visit (INDEPENDENT_AMBULATORY_CARE_PROVIDER_SITE_OTHER): Payer: Medicare HMO

## 2015-03-25 DIAGNOSIS — J849 Interstitial pulmonary disease, unspecified: Secondary | ICD-10-CM | POA: Diagnosis not present

## 2015-03-25 LAB — BASIC METABOLIC PANEL
BUN: 12 mg/dL (ref 6–23)
CALCIUM: 8.9 mg/dL (ref 8.4–10.5)
CHLORIDE: 103 meq/L (ref 96–112)
CO2: 32 mEq/L (ref 19–32)
Creatinine, Ser: 0.83 mg/dL (ref 0.40–1.20)
GFR: 69.54 mL/min (ref >60.00)
GLUCOSE: 91 mg/dL (ref 70–99)
POTASSIUM: 3.8 meq/L (ref 3.5–5.1)
Sodium: 140 mEq/L (ref 135–145)

## 2015-03-25 LAB — HEPATIC FUNCTION PANEL
ALK PHOS: 85 U/L (ref 39–117)
ALT: 6 U/L (ref 0–35)
AST: 12 U/L (ref 0–37)
Albumin: 4.1 g/dL (ref 3.5–5.2)
Bilirubin, Direct: 0.1 mg/dL (ref 0.0–0.3)
Total Bilirubin: 0.5 mg/dL (ref 0.2–1.2)
Total Protein: 7.3 g/dL (ref 6.0–8.3)

## 2015-04-01 ENCOUNTER — Telehealth: Payer: Self-pay | Admitting: Internal Medicine

## 2015-04-01 ENCOUNTER — Other Ambulatory Visit: Payer: Self-pay

## 2015-04-01 MED ORDER — CLOPIDOGREL BISULFATE 75 MG PO TABS: 75.0000 mg | ORAL_TABLET | Freq: Every day | ORAL | Status: DC

## 2015-04-01 NOTE — Progress Notes (Signed)
Quick Note:  Called and spoke to pt. Informed her of the result and recs per CY. Pt verbalized understanding and denied any further questions or concerns at this time.   ______

## 2015-04-01 NOTE — Telephone Encounter (Signed)
plavix rx sent to pharm

## 2015-04-01 NOTE — Telephone Encounter (Signed)
Patient requesting refill for clopidogrel (PLAVIX) 75 MG tablet [[id number]. [facility] is [facility] [address]

## 2015-04-12 ENCOUNTER — Ambulatory Visit: Payer: Self-pay | Admitting: Vascular Surgery

## 2015-04-12 ENCOUNTER — Other Ambulatory Visit (HOSPITAL_COMMUNITY): Payer: Self-pay

## 2015-04-19 ENCOUNTER — Ambulatory Visit: Payer: Self-pay | Admitting: Family

## 2015-04-19 ENCOUNTER — Other Ambulatory Visit (HOSPITAL_COMMUNITY): Payer: Self-pay

## 2015-04-25 ENCOUNTER — Other Ambulatory Visit: Payer: Self-pay

## 2015-04-27 ENCOUNTER — Other Ambulatory Visit: Payer: Self-pay | Admitting: Vascular Surgery

## 2015-04-27 ENCOUNTER — Encounter: Payer: Self-pay | Admitting: Vascular Surgery

## 2015-04-27 DIAGNOSIS — I6523 Occlusion and stenosis of bilateral carotid arteries: Secondary | ICD-10-CM

## 2015-04-29 ENCOUNTER — Other Ambulatory Visit: Payer: Self-pay | Admitting: Internal Medicine

## 2015-05-03 ENCOUNTER — Ambulatory Visit (INDEPENDENT_AMBULATORY_CARE_PROVIDER_SITE_OTHER): Payer: Medicare HMO | Admitting: Vascular Surgery

## 2015-05-03 ENCOUNTER — Encounter: Payer: Self-pay | Admitting: Vascular Surgery

## 2015-05-03 ENCOUNTER — Ambulatory Visit (HOSPITAL_COMMUNITY)
Admission: RE | Admit: 2015-05-03 | Discharge: 2015-05-03 | Disposition: A | Discharge status: Home / Self Care | Payer: Medicare HMO | Source: Ambulatory Visit | Attending: Vascular Surgery | Admitting: Vascular Surgery

## 2015-05-03 DIAGNOSIS — I6523 Occlusion and stenosis of bilateral carotid arteries: Secondary | ICD-10-CM | POA: Insufficient documentation

## 2015-05-03 NOTE — Progress Notes (Signed)
Filed Vitals:   05/03/15 1220 05/03/15 1236  BP: 142/88 120/84  Pulse: 78   Resp: 18   Height: 4' 9.5" (1.461 m)   Weight: 152 lb 8 oz (69.174 kg)   SpO2: 91%

## 2015-05-03 NOTE — Progress Notes (Signed)
Here today for follow-up of extracranial cerebrovascular occlusive disease. She looks quite good today. She does remain active at her age of 81. She reports that she had some difficulty walking with some soreness it was felt to be related to lumbar disc disease which is improved. She denies any focal neurologic deficits.  Past Medical History  Diagnosis Date  . PVD (peripheral vascular disease)   . Hyperlipidemia   . CAD (coronary artery disease)   . Tinnitus     chronic  . Internal hemorrhoid   . IBS (irritable bowel syndrome)   . High frequency hearing loss   . Chronic gastritis   . Benign neoplasm of esophagus   . Hiatal hernia   . Colon, diverticulosis   . Carotid artery disease   . Other nonthrombocytopenic purpuras   . Solitary pulmonary nodule   . Idiopathic pulmonary fibrosis   . COPD (chronic obstructive pulmonary disease)   . Vertigo   . GERD (gastroesophageal reflux disease)   . Anxiety and depression   . MI (myocardial infarction) 1985  . Arthritis   . PONV (postoperative nausea and vomiting)   . Shortness of breath     with exertion  . Stroke     Hx: of several mini -strokes  . Depression   . Anxiety   . Cancer     Skin cancer on back  . CHF (congestive heart failure)     History  Substance Use Topics  . Smoking status: Former Smoker -- 0.50 packs/day for 60 years    Types: Cigarettes    Quit date: 02/26/2014  . Smokeless tobacco: Never Used  . Alcohol Use: No    Family History  Problem Relation Age of Onset  . Heart attack Father     deceased at 58, MGF  . Colon cancer Father   . Prostate cancer Father   . Heart disease Father   . Hyperlipidemia Father   . Heart disease Mother     deaceased at age 53  . Colitis Mother   . Stroke Mother   . Hyperlipidemia Mother   . Colitis Sister   . Hyperlipidemia Sister   . Other Brother     deceased age 24; war  . Hyperlipidemia Brother   . Breast cancer Maternal Aunt     great   . Lung cancer  Paternal Aunt     great  . Vasculitis Son   . Hyperlipidemia Son   . Hypertension Son   . Heart attack Son   . COPD Neg Hx   . Asthma Neg Hx   . Cancer Sister     unknown  . Hyperlipidemia Sister   . Hyperlipidemia Daughter   . Hypertension Daughter     No Known Allergies   Current outpatient prescriptions:  .  aspirin EC 81 MG tablet, Take 81 mg by mouth daily., Disp: , Rfl:  .  cholecalciferol (VITAMIN D) 1000 UNITS tablet, Take 1,000 Units by mouth daily., Disp: , Rfl:  .  clopidogrel (PLAVIX) 75 MG tablet, Take 1 tablet (75 mg total) by mouth daily., Disp: 90 tablet, Rfl: 1 .  folic acid (FOLVITE) 161 MCG tablet, Take 400 mcg by mouth daily.  , Disp: , Rfl:  .  gabapentin (NEURONTIN) 100 MG capsule, One pill every eight hours as needed; dose may be increased by one pill each dose after 72 hours if only partially effective, Disp: 30 capsule, Rfl: 2 .  loratadine (CLARITIN) 10 MG tablet, Take 10  mg by mouth daily as needed for allergies., Disp: , Rfl:  .  Omega-3 Fatty Acids (FISH OIL) 1000 MG CAPS, Take 1,000 mg by mouth daily. , Disp: , Rfl:  .  Pirfenidone ([name] PO), Take by mouth 3 (three) times daily., Disp: , Rfl:  .  ranitidine (ZANTAC) 150 MG tablet, TAKE 1 TABLET BY MOUTH TWICE DAILY, Disp: 180 tablet, Rfl: 3 .  sertraline (ZOLOFT) 50 MG tablet, TAKE 1 TABLET BY MOUTH ONCE DAILY, Disp: 90 tablet, Rfl: 3 .  sertraline (ZOLOFT) 50 MG tablet, Take 1 tablet (50 mg total) by mouth daily., Disp: 90 tablet, Rfl: 3 .  simvastatin (ZOCOR) 40 MG tablet, TAKE 1 TABLET BY MOUTH EVERY DAY, Disp: 90 tablet, Rfl: 2 .  vitamin E 400 UNIT capsule, Take 400 Units by mouth daily., Disp: , Rfl:   Filed Vitals:   05/03/15 1220 05/03/15 1236  BP: 142/88 120/84  Pulse: 78   Resp: 18   Height: 4' 9.5" (1.461 m)   Weight: 152 lb 8 oz (69.174 kg)   SpO2: 91%     Body mass index is 32.41 kg/(m^2).       On physical exam, her carotid incisions are well-healed bilaterally with no  bruits bilaterally She is grossly intact neurologically  Carotid duplex today was reviewed with the patient. This reveals widely patent endarterectomy on the right. On the left she does have some moderate narrowing at the proximal repair on her common carotid artery. No difference from prior studies.  Impression and plan: Stable follow-up. Most recently from her endarterectomy the right one year ago. She had endarterectomy on the left in 1997 with Micky Sheller recurrences twice requiring redo surgery on 1997 and 1999. Fortunately continues to be stable on the left side as well. We will see her again in one year for repeat carotid duplex. She'll notify should she develop any neurologic deficits

## 2015-05-04 ENCOUNTER — Other Ambulatory Visit: Payer: Self-pay | Admitting: Internal Medicine

## 2015-05-04 DIAGNOSIS — Z1231 Encounter for screening mammogram for malignant neoplasm of breast: Secondary | ICD-10-CM

## 2015-05-12 ENCOUNTER — Ambulatory Visit (HOSPITAL_COMMUNITY)
Admission: RE | Admit: 2015-05-12 | Discharge: 2015-05-12 | Disposition: A | Discharge status: Home / Self Care | Payer: Medicare HMO | Source: Ambulatory Visit | Attending: Internal Medicine | Admitting: Internal Medicine

## 2015-05-12 DIAGNOSIS — Z1231 Encounter for screening mammogram for malignant neoplasm of breast: Secondary | ICD-10-CM | POA: Insufficient documentation

## 2015-05-31 ENCOUNTER — Other Ambulatory Visit: Payer: Self-pay | Admitting: Internal Medicine

## 2015-06-23 ENCOUNTER — Ambulatory Visit (INDEPENDENT_AMBULATORY_CARE_PROVIDER_SITE_OTHER): Payer: Medicare HMO | Admitting: Internal Medicine

## 2015-06-23 ENCOUNTER — Encounter: Payer: Self-pay | Admitting: Internal Medicine

## 2015-06-23 VITALS — BP 120/72 | HR 93 | Temp 97.5°F | Resp 18 | Wt 152.0 lb

## 2015-06-23 DIAGNOSIS — J309 Allergic rhinitis, unspecified: Secondary | ICD-10-CM

## 2015-06-23 MED ORDER — MONTELUKAST SODIUM 10 MG PO TABS
10.0000 mg | ORAL_TABLET | Freq: Every day | ORAL | Status: DC
Start: 2015-06-23 — End: 2016-02-21

## 2015-06-23 MED ORDER — CLOPIDOGREL BISULFATE 75 MG PO TABS: 75.0000 mg | ORAL_TABLET | Freq: Every day | ORAL | Status: DC

## 2015-06-23 NOTE — Progress Notes (Signed)
   Subjective:    Patient ID: Linda Kelly, female    DOB: 1928/08/16, 81 y.o.   MRN: [id number]  HPI  She's been sneezing intermittently for 2 months; this is associated with profuse clear rhinorrhea. This can be severe enough that it causes vomiting. She has associated itchy, watery eyes. She also has wheezing and shortness of breath.  She has no associated upper respiratory tract infection symptoms.  Review of Systems Frontal headache, facial pain , nasal purulence, dental pain, sore throat , otic pain or otic discharge denied. No fever , chills or sweats.     Objective:   Physical Exam  General appearance:Adequately nourished; no acute distress or increased work of breathing is present.    Lymphatic: No  lymphadenopathy about the head, neck, or axilla .  Eyes: No conjunctival inflammation or lid edema is present. There is no scleral icterus. Some hyperpigmentation of the lower lids present.  Ears:  External ear exam shows no significant lesions or deformities. Poor visualization tympanic membranes..  Nose:  External nasal examination shows no deformity or inflammation. Nasal mucosa are pink and moist without lesions or exudates No septal dislocation or deviation.No obstruction to airflow.   Oral exam: Complete dentures; lips and gums are healthy appearing.There is no oropharyngeal erythema or exudate .  Neck:  No deformities, thyromegaly, masses, or tenderness noted.   Supple with full range of motion without pain.   Heart:  Normal rate and regular rhythm. S1 and S2 normal without gallop, click, rub or other extra sounds. Grade 1 systolic murmur.  Lungs: Diffuse dry rales in homogenous distribution in all lung fields.  Extremities:  No cyanosis, edema noted    Skin: Warm & dry w/o tenting or jaundice. No significant lesions or rash.      Assessment & Plan:  #1 allergic rhinitis See orders & AVS

## 2015-06-23 NOTE — Progress Notes (Signed)
Pre visit review using our clinic review tool, if applicable. No additional management support is needed unless otherwise documented below in the visit note. 

## 2015-06-23 NOTE — Patient Instructions (Addendum)
Plain Mucinex (NOT D) for thick secretions ;force NON dairy fluids .   Nasal cleansing in the shower as discussed with lather of mild shampoo.After 10 seconds wash off lather while  exhaling through nostrils. Make sure that all residual soap is removed to prevent irritation.  Flonase OR Nasacort AQ 1 spray in each nostril twice a day as needed. Use the "crossover" technique into opposite nostril spraying toward opposite ear @ 45 degree angle, not straight up into nostril.  Plain Allegra (NOT D )  160 daily , Loratidine 10 mg , OR Zyrtec 10 mg @ bedtime  as needed for itchy eyes & sneezing.  Fill the  prescription for Montelukast if no better in the next 48-72 hours.

## 2015-06-30 ENCOUNTER — Ambulatory Visit: Payer: Self-pay | Admitting: Internal Medicine

## 2015-07-11 ENCOUNTER — Telehealth: Payer: Self-pay | Admitting: Internal Medicine

## 2015-07-11 ENCOUNTER — Other Ambulatory Visit (INDEPENDENT_AMBULATORY_CARE_PROVIDER_SITE_OTHER): Payer: Medicare HMO

## 2015-07-11 DIAGNOSIS — J849 Interstitial pulmonary disease, unspecified: Secondary | ICD-10-CM | POA: Diagnosis not present

## 2015-07-11 LAB — BASIC METABOLIC PANEL
BUN: 18 mg/dL (ref 6–23)
CALCIUM: 9.5 mg/dL (ref 8.4–10.5)
CO2: 27 mEq/L (ref 19–32)
Chloride: 103 mEq/L (ref 96–112)
Creatinine, Ser: 0.95 mg/dL (ref 0.40–1.20)
GFR: 59.46 mL/min — AB (ref >60.00)
Glucose, Bld: 135 mg/dL — ABNORMAL HIGH (ref 70–99)
POTASSIUM: 3.8 meq/L (ref 3.5–5.1)
SODIUM: 141 meq/L (ref 135–145)

## 2015-07-11 LAB — HEPATIC FUNCTION PANEL
ALK PHOS: 76 U/L (ref 39–117)
ALT: 9 U/L (ref 0–35)
AST: 14 U/L (ref 0–37)
Albumin: 4.1 g/dL (ref 3.5–5.2)
Bilirubin, Direct: 0.1 mg/dL (ref 0.0–0.3)
TOTAL PROTEIN: 7.4 g/dL (ref 6.0–8.3)
Total Bilirubin: 0.5 mg/dL (ref 0.2–1.2)

## 2015-07-11 NOTE — Telephone Encounter (Signed)
Pt came by the office and stated when she saw Dr. Linna Darner last month, he had mentioned the name of a dermatologist for her, but pt cannot remember who he recommended.  Please call pt with the name of that MD.

## 2015-07-12 NOTE — Telephone Encounter (Signed)
Dr Tonia Brooms or Dr Allyson Sabal are good

## 2015-07-14 ENCOUNTER — Encounter: Payer: Self-pay | Admitting: Internal Medicine

## 2015-07-14 ENCOUNTER — Ambulatory Visit (INDEPENDENT_AMBULATORY_CARE_PROVIDER_SITE_OTHER): Payer: Commercial Managed Care - PPO | Admitting: Internal Medicine

## 2015-07-14 VITALS — BP 114/80 | HR 82 | Ht <= 58 in | Wt 152.4 lb

## 2015-07-14 DIAGNOSIS — Z23 Encounter for immunization: Secondary | ICD-10-CM

## 2015-07-14 DIAGNOSIS — I700 Atherosclerosis of aorta: Secondary | ICD-10-CM | POA: Diagnosis not present

## 2015-07-14 DIAGNOSIS — J84111 Idiopathic interstitial pneumonia, not otherwise specified: Secondary | ICD-10-CM

## 2015-07-14 NOTE — Patient Instructions (Addendum)
Flu vax  Ok to continue [name] as you are doing  Prevnar 13

## 2015-07-14 NOTE — Progress Notes (Signed)
Patient ID: Linda Kelly, female    DOB: [birth year], 81 y.o.   MRN: [id number]  HPI 06/07/11- 81 yoF 1/2 PPD smoker followed for pulmonary fibrosis/ ILD, COPD complicated by CAD,PAD hx lung nodule, h[place] exposure with long term f/u here.  Still says she is trying to cut back on her smoking "trying". Been having GI complaints- Dr Carlean Purl colonoscopy. She admits tiredness overlapping with shortness of breath. She will nap and put her feet up. Not much cough- scant phlegm, no blood or purulent. Doing water aerobics 3 days/ week. Last here October 10, 2010 CXR 10/10/10- unchanged basilar fibrotic scarring- reviewed.   12/11/11-  81 yoF 1/2 PPD smoker followed for pulmonary fibrosis/ ILD, COPD complicated by CAD,PAD hx lung nodule, hx [place] exposure with long term f/u here.  Now being followed for [place]/ [place] dust exposure at [place]- they did CXR and PFT recently.  She has felt "much better" although continues to smoke against advice. At "Y" 3 days/ week.Little cough or sputum. No pain, blood, nodes, fever. CXR- 11/06/11- stable mild interstitial prominence and interstitial fibrotic changes at bases.  PFT-06/04/11- FEV1 1.26/96%, FEV1/FVC 0.9 . Slight response to bronchodilator with normal flows. Residual volume 120%, DLCO 34% which is severely reduced.  02/04/13- 81 yoF 1/2 PPD smoker followed for pulmonary fibrosis/ ILD, COPD complicated by CAD,PAD hx lung nodule, hx [place] exposure with long term f/u here.  Now being followed for [place]/ [place] dust exposure. She is still followed by occupational health but brings her extensive set of update forms here. Still smoking one half pack per day with no effort to stop. Continues water aerobics 3 times per week. Slight transient burning sensation in mid chest occasionally, relieved by resting for a while but not clearly exertional. 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 Aware of frequent heartburn and reflux.[place] Program days for Zantac and Zoloft. Occasional wheeze. Little cough, dry. She does not feel she needs a metered inhaler. CXR 09/14/12 IMPRESSION:  Borderline cardiac size. Chronic interstitial fibrotic infiltrate  densities are again evident. No definite acute superimposed  process is identified. There is central peribronchial thickening.  No pleural effusion is evident.  Original Report Authenticated By: Shanon Brow Call   04/06/13- 81 yoF 1/2 PPD smoker followed for pulmonary fibrosis/ ILD, COPD complicated by CAD,PAD hx lung nodule, hx [place] exposure with long term f/u here.  FOLLOWS FOR: Breathing is unchanged. Here to review PFT and CT results. Less cough now than she had during the winter. Continues to smoke against advice and this was discussed again. Has had some sneezing rhinitis symptoms with the spring pollen. Admits feeling tired today. Denies chest pain, edema, discolored sputum, fever or lymph nodes. CT chest 02/20/13 IMPRESSION:  1. Mild progression of interstitial fibrosis with an appearance  most compatible with usual interstitial pneumonia.  2. Small ascending aortic aneurysm is slightly increased in size  since the prior study.  3. Calcific aortic and coronary atherosclerosis.  Original Report Authenticated By: Orlean Patten, M.D. PFT 04/21/13- spirometry within normal limits lung volumes within normal limits. Severe decrease in diffusion capacity. Insignificant response to bronchodilator.FVC 1.60/86%, FEV1 1.37/116%, FEV1/FVC 0.86, FEF 25-75% 2.10/133%. TLC 94%, DLCO 43%.  08/06/13- 81 yoF 1/2 PPD smoker followed for pulmonary fibrosis/ ILD, COPD complicated by CAD,PAD hx lung nodule, hx [place] exposure with long term  f/u here.  FOLLOWS FOR:  Breathing slightly worse since last OV.  SOB increased with exertion and coughing w/ clear mucus She continues  to smoke against all efforts and advice, and is not trying to quit. Admits maybe a little easier dyspnea on exertion, not much cough. Recognized one episode of reflux, takes omeprazole. Occasional chest tightness, nonradiating, no pain, not clearly exertional.  02/04/14- 81 yoF 1/2 PPD smoker followed for pulmonary fibrosis/ ILD, COPD complicated by CAD,PAD hx lung nodule, hx [place] exposure with long term f/u here. FOLLOWS FOR: Pt states her breathing is about the same as last visit; Activity causes her to have SOB. Less cough lately than in past. Little phlegm. No CP or palpitation. Was seen for [facility] f/u labs this AM by [facility]/ Dr Geoffry Paradise- they sent her here w/ EKG. She does not see their physician. Labs are forwarded to [facility] claims center. EKG 02/04/14- small inferior Q's, no acute. Mentions episode L hand numbness 2 weeks ago, lasted most of day. No syncope, blurring, HA. Continues BASA. Hx L CEA years ago/ Dr Donnetta Hutching. PFT-02/19/13- Normal flows, insignif response to BD, severely reduced DLCO. FVC 1.60/ 86%, FEV1 1.37/ 116%, FEV1/FVC 0.86, FEF25-75% 2.10/ 133%. TLC 94%, DLCO 43%. CT 02/20/13 IMPRESSION:  1. Mild progression of interstitial fibrosis with an appearance  most compatible with usual interstitial pneumonia.  2. Small ascending aortic aneurysm is slightly increased in size  since the prior study.  3. Calcific aortic and coronary atherosclerosis.  Original Report Authenticated By: Orlean Patten, M.D. CXR 02/04/14 IMPRESSION:  COPD with scarring.  Small left pleural effusion.  Electronically Signed  By: Franchot Gallo M.D.  On: 02/04/2014 09:40  03/18/14- 81 yoF 1/2 PPD smoker followed for pulmonary fibrosis/ ILD/UIP, COPD complicated by CAD/ hx MI,PAD hx lung nodule, hx [place]/ [place] dust exposure with long term f/u here. FOLLOWS FOR: uses her O2 about 3/4 the time only because someone is "nagging" her to use it. Pt feels like she does not  need the O2. Pt states her breathing is fine.    Daughter is here Walk Test on room air 03/18/14-  23 m, lowest 92% Had R carotid endarterectomy/ Dr Donnetta Hutching Denies significant cough, wheeze, phlegm, chest pain, palpitation.  [place] for [facility] f/u-02/04/14- EKG NSR, old INF MI, CMET (incl LFT, renal, glu) wnl Cr 0.74, CBC wnl.   Labs scanned to media. Spirometry 02/04/14- WNL FVC 1.47/ 88%, FEV1 1.19/ 98%, FEV1/FVC 0.73, FEF25-75% 1.42/ 162%   05/18/14- 85 yoF former smoker followed for pulmonary fibrosis/ ILD/UIP, COPD complicated by CAD/ hx MI,PAD hx lung nodule, hx [place]/ [place] dust exposure with long term f/u here. FOLLOWS FOR:  Pt revieved [name] and would like to discuss starting.  Breathing doing well, reports has been off balance over the past few months O2 was dc'd in May after [place]. Oxygenation has improved a bit since she quit smoking. Little routine cough or wheeze. Dyspnea noted with exertion, but she is less active since cautious about feeling unsteady. We again discussed [name] side effects/ GI. She has contact for their patient support program. She states she is willing to start and has her first shipment.  06/29/14- 56 yoF former smoker followed for pulmonary fibrosis/ ILD/UIP, COPD complicated by CAD/ hx MI,PAD hx lung nodule, dizziness, hx [place]/ [place] dust exposure with long term f/u here. Working on [employer] approval w/ [employer]. She completed 270  tab starter set- well tolerated up to 3 tabs TID, running out one week ago., but we think [facility] is hold-up for continuing Rx.  Has sun tan lotion .Had neurology workup for dizziness. Also saw ENT. MRI scan was recommended but she wanted to wait to see if the dizziness would get worse. No loss of hearing and no headache. Taking swim classes for exercise  12/28/14- 86 yoF former smoker followed for pulmonary fibrosis/ ILD/UIP, COPD complicated by CAD/ hx MI,PAD hx lung nodule, dizziness,  hx [place]/ [place] dust exposure with long term f/u here. FOLLOWS FOR:sob with exertion slightly worse,does fine sitting still,no wheezing,no cough,denies cp or tightness.Wants to know status of [name] process..Not taking yet. [name] formal start has been on hold because of insurance delay - being worked on. Walk Test 12/28/14- 2 laps x 185 ft, desat to 90% on room air, stopped because "tired"  Easier fatigue walking- primarily because her thighs begin to ache, even walking to the dumpster. Little cough or wheeze.  07/14/15- 38 yoF former smoker followed for pulmonary fibrosis/ ILD/UIP, COPD complicated by CAD/ hx MI,PAD hx lung nodule, dizziness, hx [place]/ [place] dust exposure with long term f/u here. Continues [name] begun 04/21/15 but only tolerating one or 2 times daily to avoid GI upset that she experienced with 3 times daily Aware of dyspnea on exertion and says her equilibrium is not good. She has a cane at home. Has handicapped parking.  Benefits from water aerobics 3 days per week. Little routine cough or wheeze. We reviewed her CT images with changes of UIP and atherosclerosis CT chest 01/04/15 IMPRESSION: 1. The appearance the lungs remains compatible with interstitial lung disease, and the pattern is most compatible with usual interstitial pneumonia (UIP), as discussed above. There has been very little change compared to the most recent prior study from 02/10/2012, suggesting a positive response to therapy. 2. Atherosclerosis, including three-vessel coronary artery disease. In addition, there is ectasia of the ascending thoracic aorta (4.3 x 4.1 cm), which is unchanged. Electronically Signed  By: Vinnie Langton M.D.  On: 01/04/2015 16:45  Review of Systems-see HPI Constitutional:   No-   weight loss, night sweats, fevers, chills,+ fatigue, lassitude. HEENT:   No-  headaches, difficulty swallowing, tooth/dental problems, sore throat,       No-   sneezing, itching, ear ache, nasal congestion, post nasal drip,  CV:  No-   chest pain, orthopnea, PND, swelling in lower extremities, anasarca, dizziness, palpitations Resp: +shortness of breath with exertion or at rest.              No-   productive cough,   non-productive cough,  No-  coughing up of blood.              No-   change in color of mucus.  No- wheezing.   Skin: No-   rash or lesions. GI:  + heartburn, indigestion, no-abdominal pain, nausea, vomiting,  GU: MS:  No-   joint pain or swelling.  . Neuro- + dizziness Psych:  No- change in mood or affect. No depression or anxiety.  No memory loss.  Objective:   Physical Exam General- Alert, Oriented, Affect-appropriate, Distress- none acute, looks well. Skin- rash-none, lesions- none, excoriation- none Lymphadenopathy- none Head- atraumatic            Eyes- Gross vision intact, PERRLA, conjunctivae clear secretions            Ears- Hearing, canals normal  Nose- Clear, No-Septal dev, mucus, polyps, erosion, perforation             Throat- Mallampati II , mucosa clear , drainage- none, tonsils- atrophic;  dentures Neck- flexible , trachea midline, no stridor , thyroid nl, carotid +R bruit Chest - symmetrical excursion , unlabored           Heart/CV- RRR , no murmur , no gallop  , no rub, nl s1 s2                           - JVD- none , edema- none, stasis changes- none, varices- none           Lung- +crackles to mid chest, unlabored at rest,  wheeze- none, cough- none , dullness-none, rub- none 96% RA           Chest wall-  Abd-  Br/ Gen/ Rectal- Not done, not indicated Extrem- cyanosis- none, clubbing- none, atrophy- none, strength- nl Neuro- +seems alert and sharp at this visit

## 2015-07-16 NOTE — Assessment & Plan Note (Signed)
She has adjusted as per dose for tolerance to minimize GI side effects. CT suggests no progression of UIP, which is gratifying Plan-flu vaccine, continue water aerobics and walking for endurance as tolerated

## 2015-07-16 NOTE — Assessment & Plan Note (Signed)
Noted again on CT of 01/04/2015

## 2015-07-18 NOTE — Telephone Encounter (Signed)
Informed pt .

## 2015-07-21 ENCOUNTER — Telehealth: Payer: Self-pay | Admitting: Internal Medicine

## 2015-07-21 MED ORDER — PIRFENIDONE 267 MG PO CAPS: 3.0000 | ORAL_CAPSULE | Freq: Three times a day (TID) | ORAL | Status: DC

## 2015-07-21 NOTE — Telephone Encounter (Signed)
Rx sent to pharmacy   

## 2015-08-01 ENCOUNTER — Other Ambulatory Visit: Payer: Self-pay | Admitting: Internal Medicine

## 2015-08-02 ENCOUNTER — Telehealth: Payer: Self-pay | Admitting: Internal Medicine

## 2015-08-02 ENCOUNTER — Other Ambulatory Visit (INDEPENDENT_AMBULATORY_CARE_PROVIDER_SITE_OTHER): Payer: Medicare HMO

## 2015-08-02 DIAGNOSIS — J849 Interstitial pulmonary disease, unspecified: Secondary | ICD-10-CM | POA: Diagnosis not present

## 2015-08-02 LAB — BASIC METABOLIC PANEL
BUN: 17 mg/dL (ref 6–23)
CALCIUM: 9.4 mg/dL (ref 8.4–10.5)
CO2: 31 mEq/L (ref 19–32)
Chloride: 101 mEq/L (ref 96–112)
Creatinine, Ser: 0.83 mg/dL (ref 0.40–1.20)
GFR: 69.48 mL/min (ref >60.00)
Glucose, Bld: 83 mg/dL (ref 70–99)
Potassium: 4.4 mEq/L (ref 3.5–5.1)
SODIUM: 138 meq/L (ref 135–145)

## 2015-08-02 LAB — HEPATIC FUNCTION PANEL
ALBUMIN: 4.2 g/dL (ref 3.5–5.2)
ALK PHOS: 85 U/L (ref 39–117)
ALT: 9 U/L (ref 0–35)
AST: 15 U/L (ref 0–37)
BILIRUBIN TOTAL: 0.4 mg/dL (ref 0.2–1.2)
Bilirubin, Direct: 0.1 mg/dL (ref 0.0–0.3)
Total Protein: 7.5 g/dL (ref 6.0–8.3)

## 2015-08-02 NOTE — Telephone Encounter (Signed)
Called the number listed, this was to a surgical center in a hospital. Called the number listed for [facility], verifying dose of esbriet.   All questions have been answered.  Nothing further needed.

## 2015-08-08 NOTE — Progress Notes (Signed)
Quick Note:  Attempted to contact pt, no answer and no voicemail to leave message. ______

## 2015-09-02 ENCOUNTER — Encounter: Payer: Self-pay | Admitting: Cardiology

## 2015-09-02 NOTE — Progress Notes (Signed)
HPI: FU carotid artery disease, CAD, HLD, pulmonary fibrosis. Her cardiac issues date back to 19 when she had an MI. She was treated with a balloon angioplasty at that time. Repeat cath July 1985 with patent RCA. She has been stable since then from a cardiac standpoint. Last stress myoview in October of 2010 showed inferior and inferolateral infarct but no significant ischemia. Ejection fraction was 60%. Renal dopplers 12/12 showed stable 1-59 bilateral renal artery stenosis. Previous carotid artery dopplers revealed high-grade greater than 80% stenosis in the right internal carotid artery, with moderate stenosis in the left in the 60-79% range. She does have retrograde flow in her left vertebral artery with monophasic flow in her brachial waveforms suggesting proximal subclavian occlusion and vertebral steal syndrome. CTA of the neck confirmed these findings. Patient had a right carotid endarterectomy in May 2015. Now followed by vascular surgery. Since last seen,   Current Outpatient Prescriptions  Medication Sig Dispense Refill  . aspirin EC 81 MG tablet Take 81 mg by mouth daily.    . cholecalciferol (VITAMIN D) 1000 UNITS tablet Take 1,000 Units by mouth daily.    . clopidogrel (PLAVIX) 75 MG tablet Take 1 tablet (75 mg total) by mouth daily. 90 tablet 1  . folic acid (FOLVITE) 562 MCG tablet Take 400 mcg by mouth daily.      Marland Kitchen gabapentin (NEURONTIN) 100 MG capsule One pill every eight hours as needed; dose may be increased by one pill each dose after 72 hours if only partially effective 30 capsule 2  . loratadine (CLARITIN) 10 MG tablet Take 10 mg by mouth daily as needed for allergies.    . montelukast (SINGULAIR) 10 MG tablet Take 1 tablet (10 mg total) by mouth at bedtime. 30 tablet 3  . Omega-3 Fatty Acids (FISH OIL) 1000 MG CAPS Take 1,000 mg by mouth daily.     . Pirfenidone ([name]) 267 MG CAPS Take 3 capsules by mouth 3 (three) times daily with meals. 270 capsule 0  .  ranitidine (ZANTAC) 150 MG tablet TAKE 1 TABLET BY MOUTH TWICE DAILY 180 tablet 3  . ranitidine (ZANTAC) 150 MG tablet TAKE 1 TABLET BY MOUTH TWICE DAILY 180 tablet 2  . sertraline (ZOLOFT) 50 MG tablet Take 1 tablet (50 mg total) by mouth daily. 90 tablet 3  . simvastatin (ZOCOR) 40 MG tablet TAKE 1 TABLET BY MOUTH EVERY DAY 90 tablet 1  . vitamin E 400 UNIT capsule Take 400 Units by mouth daily.     No current facility-administered medications for this visit.     Past Medical History  Diagnosis Date  . PVD (peripheral vascular disease)   . Hyperlipidemia   . CAD (coronary artery disease)   . Tinnitus     chronic  . Internal hemorrhoid   . IBS (irritable bowel syndrome)   . High frequency hearing loss   . Chronic gastritis   . Benign neoplasm of esophagus   . Hiatal hernia   . Colon, diverticulosis   . Carotid artery disease   . Other nonthrombocytopenic purpuras   . Solitary pulmonary nodule   . Idiopathic pulmonary fibrosis   . COPD (chronic obstructive pulmonary disease)   . Vertigo   . GERD (gastroesophageal reflux disease)   . Anxiety and depression   . MI (myocardial infarction) 1985  . Arthritis   . PONV (postoperative nausea and vomiting)   . Shortness of breath     with exertion  . Stroke  Hx: of several mini -strokes  . Depression   . Anxiety   . Cancer     Skin cancer on back  . CHF (congestive heart failure)     Past Surgical History  Procedure Laterality Date  . Foot surgery      bilateral  . Toe surgery      right foot  . Carotid endarterectomy      left side x 3, saphenous vein graft from left leg  . Tonsillectomy    . Shoulder surgery      left  . Dental surgery      2012   . Angioplasty  1997, 98, 99  . Upper gastrointestinal endoscopy  11-27-01, 08-18-08    Hiatal hernia, benign esophagus neoplasia, gastritis, tortuous esophagus 33 Maloney dilation performed  . Colonoscopy  11-27-01, 10-30-05, 05-24-11    diverticulosis, hemorrhoids  .  Colon surgery    . Cataract extraction w/ intraocular lens  implant, bilateral    . Breast surgery      Hx; of biopsy  . Tubal ligation    . Endarterectomy Right 03/03/2014    Procedure: RIGHT CAROTID ENDARTERECTOMY WITH PATCH ANGIOPLASTY;  Surgeon: Rosetta Posner, MD;  Location: [facility] OR;  Service: Vascular;  Laterality: Right;    Social History   Social History  . Marital Status: Widowed    Spouse Name: N/A  . Number of Children: 6  . Years of Education: N/A   Occupational History  . 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     Social History Main Topics  . Smoking status: Former Smoker -- 0.50 packs/day for 60 years    Types: Cigarettes    Quit date: 02/26/2014  . Smokeless tobacco: Never Used  . Alcohol Use: No  . Drug Use: No  . Sexual Activity: No   Other Topics Concern  . Not on file   Social History Narrative   [place] to [place] twice a year to visit sister- 3-4 weeks travel there every year.   Pt does not get regular exercise    ROS: no fevers or chills, productive cough, hemoptysis, dysphasia, odynophagia, melena, hematochezia, dysuria, hematuria, rash, seizure activity, orthopnea, PND, pedal edema, claudication. Remaining systems are negative.  Physical Exam: Well-developed well-nourished in no acute distress.  Skin is warm and dry.  HEENT is normal.  Neck is supple.  Chest is clear to auscultation with normal expansion.  Cardiovascular exam is regular rate and rhythm.  Abdominal exam nontender or distended. No masses palpated. Extremities show no edema. neuro grossly intact  ECG     This encounter was created in error - please disregard.

## 2015-10-03 ENCOUNTER — Telehealth: Payer: Self-pay | Admitting: Internal Medicine

## 2015-10-03 NOTE — Telephone Encounter (Signed)
atc pt, no answer, no vm.  Wcb.  

## 2015-10-04 ENCOUNTER — Ambulatory Visit (HOSPITAL_COMMUNITY)
Admission: RE | Admit: 2015-10-04 | Discharge: 2015-10-04 | Disposition: A | Discharge status: Home / Self Care | Payer: Medicare HMO | Source: Ambulatory Visit | Attending: Vascular Surgery | Admitting: Vascular Surgery

## 2015-10-04 ENCOUNTER — Ambulatory Visit: Payer: Self-pay | Admitting: Vascular Surgery

## 2015-10-04 DIAGNOSIS — I708 Atherosclerosis of other arteries: Secondary | ICD-10-CM | POA: Insufficient documentation

## 2015-10-04 DIAGNOSIS — E785 Hyperlipidemia, unspecified: Secondary | ICD-10-CM | POA: Diagnosis not present

## 2015-10-04 DIAGNOSIS — I739 Peripheral vascular disease, unspecified: Secondary | ICD-10-CM | POA: Diagnosis not present

## 2015-10-04 NOTE — Telephone Encounter (Signed)
Left message for patient to call back  

## 2015-10-05 ENCOUNTER — Encounter: Payer: Self-pay | Admitting: Vascular Surgery

## 2015-10-05 NOTE — Telephone Encounter (Signed)
Left message for patient to call back  

## 2015-10-11 ENCOUNTER — Telehealth: Payer: Self-pay | Admitting: *Deleted

## 2015-10-11 ENCOUNTER — Ambulatory Visit: Payer: Self-pay | Admitting: Vascular Surgery

## 2015-10-11 NOTE — Telephone Encounter (Signed)
Received call pt states she has a scratchy throat/ cough, but not coughing anything up.Just not feeling well wanting md to call in antibiotic. Inform pt she will have to be seen before md can call something in. She stated she is not able to come in will gargle throat with warm salt water if no better by Friday will call back for appt...Johny Chess

## 2015-10-12 ENCOUNTER — Encounter: Payer: Self-pay | Admitting: Internal Medicine

## 2015-10-12 ENCOUNTER — Ambulatory Visit (INDEPENDENT_AMBULATORY_CARE_PROVIDER_SITE_OTHER): Payer: Medicare HMO | Admitting: Internal Medicine

## 2015-10-12 VITALS — BP 144/80 | HR 93 | Temp 98.0°F | Ht 59.0 in | Wt 148.0 lb

## 2015-10-12 DIAGNOSIS — J069 Acute upper respiratory infection, unspecified: Secondary | ICD-10-CM | POA: Diagnosis not present

## 2015-10-12 DIAGNOSIS — B9789 Other viral agents as the cause of diseases classified elsewhere: Principal | ICD-10-CM

## 2015-10-12 MED ORDER — DOXYCYCLINE HYCLATE 100 MG PO TABS: 100.0000 mg | ORAL_TABLET | Freq: Two times a day (BID) | ORAL | Status: DC

## 2015-10-12 NOTE — Patient Instructions (Signed)
Plain Mucinex (NOT D) for thick secretions ;force NON dairy fluids .   Nasal cleansing in the shower as discussed with lather of mild shampoo.After 10 seconds wash off lather while  exhaling through nostrils. Make sure that all residual soap is removed to prevent irritation.  Flonase OR Nasacort AQ 1 spray in each nostril twice a day as needed. Use the "crossover" technique into opposite nostril spraying toward opposite ear @ 45 degree angle, not straight up into nostril.  Plain Allegra (NOT D )  160 daily , Loratidine 10 mg , OR Zyrtec 10 mg @ bedtime  as needed for itchy eyes & sneezing. Zicam Melts or Zinc lozenges as per package label for sore throat .  Complementary options to boost immunity include  vitamin C 2000 mg daily; & Echinacea for 4-7 days.  Fill the  prescription for antibiotic if fever; discolored nasal or chest secretions; or frontal headache or facial pain  present  

## 2015-10-12 NOTE — Progress Notes (Signed)
Pre visit review using our clinic review tool, if applicable. No additional management support is needed unless otherwise documented below in the visit note. 

## 2015-10-12 NOTE — Progress Notes (Signed)
   Subjective:    Patient ID: SAE EUTSLER, female    DOB: 05/01/1928, 81 y.o.   MRN: [id number]  HPI She presents with sore throat and nonproductive cough for the last 4 days. She did bring up a scant amount of clear secretions on one occasion only. She has had associated itchy /watery eyes as well as sneezing.  She has no other upper or lower respiratory tract infection symptoms.  Review of Systems Frontal headache, facial pain , nasal purulence, dental pain,  otic pain or otic discharge denied. No fever , chills or sweats.     Objective:   Physical Exam  General appearance:Adequately nourished; no acute distress or increased work of breathing is present.    Lymphatic: No  lymphadenopathy about the head, neck, or axilla .  Eyes: No conjunctival inflammation or lid edema is present. There is no scleral icterus.  Ears:  External ear exam shows no significant lesions or deformities.  Otoscopic examination reveals clear canals, tympanic membranes are intact bilaterally without bulging, retraction, inflammation or discharge.  Nose:  External nasal examination shows no deformity or inflammation. Nasal mucosa are  Erythematous without lesions or exudates No septal dislocation or deviation.No obstruction to airflow.   Oral exam: Dental hygiene is good; lips and gums are healthy appearing.There is no oropharyngeal erythema or exudate .  Neck:  No deformities, thyromegaly, masses, or tenderness noted.   Supple with full range of motion without pain.   Heart:  Normal rate and regular rhythm. S1 and S2 normal without gallop, murmur, click, rub or other extra sounds.   Lungs: she has low-grade scattered rales with no increased work of breathing  Extremities:  No cyanosis, edema, or clubbing  noted  Isolated DIP arthritic changes present in the hands   Skin: Warm & dry w/o tenting or jaundice. No significant lesions or rash.     Assessment & Plan:  #1 viral RTI  See orders & AVS

## 2015-10-14 NOTE — Telephone Encounter (Signed)
lmtcb for pt.  

## 2015-10-25 ENCOUNTER — Telehealth: Payer: Self-pay | Admitting: Internal Medicine

## 2015-10-25 NOTE — Telephone Encounter (Signed)
Spoke with pt. States that she is having side effects from [place]. Reports severe GERD, diarrhea and vomiting. CY reduced [name] to 2 capsules BID. She is still having issues at this dosing. Wants to stop medication but wants CY's opinion.  CY - please advise. Thanks.

## 2015-10-26 NOTE — Telephone Encounter (Signed)
[facility] would have given her contact information about a symptom control nurse they provide. Mrs Conine can call that person for advice if she wishes.  Otherwise, I would suggest she stop [name] for now. We can look at the situation again at her next ov and decide what to do.

## 2015-10-26 NOTE — Telephone Encounter (Signed)
ATC PT, NA, line rang numerous times and no VM. WCB

## 2015-10-27 NOTE — Telephone Encounter (Signed)
Spoke with pt,aware of recs.  Nothing further needed.  

## 2015-12-26 ENCOUNTER — Other Ambulatory Visit: Payer: Self-pay | Admitting: Internal Medicine

## 2015-12-27 ENCOUNTER — Other Ambulatory Visit: Payer: Self-pay | Admitting: Internal Medicine

## 2016-01-12 ENCOUNTER — Encounter: Payer: Self-pay | Admitting: Internal Medicine

## 2016-01-12 ENCOUNTER — Telehealth: Payer: Self-pay | Admitting: Internal Medicine

## 2016-01-12 ENCOUNTER — Ambulatory Visit (INDEPENDENT_AMBULATORY_CARE_PROVIDER_SITE_OTHER): Payer: Commercial Managed Care - PPO | Admitting: Internal Medicine

## 2016-01-12 VITALS — BP 122/78 | HR 84 | Ht <= 58 in | Wt 149.6 lb

## 2016-01-12 DIAGNOSIS — J849 Interstitial pulmonary disease, unspecified: Secondary | ICD-10-CM | POA: Diagnosis not present

## 2016-01-12 DIAGNOSIS — Z720 Tobacco use: Secondary | ICD-10-CM

## 2016-01-12 DIAGNOSIS — J84111 Idiopathic interstitial pneumonia, not otherwise specified: Secondary | ICD-10-CM | POA: Diagnosis not present

## 2016-01-12 DIAGNOSIS — I700 Atherosclerosis of aorta: Secondary | ICD-10-CM | POA: Diagnosis not present

## 2016-01-12 DIAGNOSIS — K219 Gastro-esophageal reflux disease without esophagitis: Secondary | ICD-10-CM

## 2016-01-12 NOTE — Patient Instructions (Signed)
Order- schedule High Resolution CT chest      Dx UIP interstitial lung disease, aortic ectasia

## 2016-01-12 NOTE — Assessment & Plan Note (Signed)
She has not restarted smoking

## 2016-01-12 NOTE — Progress Notes (Signed)
Patient ID: Linda Kelly, female    DOB: [birth year], 82 y.o.   MRN: [id number]  HPI 06/07/11- 28 yoF 1/2 PPD smoker followed for pulmonary fibrosis/ ILD, COPD complicated by CAD,PAD hx lung nodule, hx [place] exposure with long term f/u here.  Still says she is trying to cut back on her smoking "trying". Been having GI complaints- Dr Carlean Purl colonoscopy. She admits tiredness overlapping with shortness of breath. She will nap and put her feet up. Not much cough- scant phlegm, no blood or purulent. Doing water aerobics 3 days/ week. Last here October 10, 2010 CXR 10/10/10- unchanged basilar fibrotic scarring- reviewed.   12/11/11-  79 yoF 1/2 PPD smoker followed for pulmonary fibrosis/ ILD, COPD complicated by CAD,PAD hx lung nodule, hx [place] exposure with long term f/u here.  Now being followed for [place]/ [place] dust exposure at [place]- they did CXR and PFT recently.  She has felt "much better" although continues to smoke against advice. At "Y" 3 days/ week.Little cough or sputum. No pain, blood, nodes, fever. CXR- 11/06/11- stable mild interstitial prominence and interstitial fibrotic changes at bases.  PFT-06/04/11- FEV1 1.26/96%, FEV1/FVC 0.9 . Slight response to bronchodilator with normal flows. Residual volume 120%, DLCO 34% which is severely reduced.  02/04/13- 69 yoF 1/2 PPD smoker followed for pulmonary fibrosis/ ILD, COPD complicated by CAD,PAD hx lung nodule, hx [place] exposure with long term f/u here.  Now being followed for [place]/ [place] dust exposure. She is still followed by occupational health but brings her extensive set of update forms here. Still smoking one half pack per day with no effort to stop. Continues water aerobics 3 times per week. Slight transient burning sensation in mid chest occasionally, relieved by resting for a while but not clearly exertional. 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 Aware of frequent heartburn and reflux.[place] Program days for Zantac and Zoloft. Occasional wheeze. Little cough, dry. She does not feel she needs a metered inhaler. CXR 09/14/12 IMPRESSION:  Borderline cardiac size. Chronic interstitial fibrotic infiltrate  densities are again evident. No definite acute superimposed  process is identified. There is central peribronchial thickening.  No pleural effusion is evident.  Original Report Authenticated By: Shanon Brow Call   04/06/13- 82 yoF 1/2 PPD smoker followed for pulmonary fibrosis/ ILD, COPD complicated by CAD,PAD hx lung nodule, hx [place] exposure with long term f/u here.  FOLLOWS FOR: Breathing is unchanged. Here to review PFT and CT results. Less cough now than she had during the winter. Continues to smoke against advice and this was discussed again. Has had some sneezing rhinitis symptoms with the spring pollen. Admits feeling tired today. Denies chest pain, edema, discolored sputum, fever or lymph nodes. CT chest 02/20/13 IMPRESSION:  1. Mild progression of interstitial fibrosis with an appearance  most compatible with usual interstitial pneumonia.  2. Small ascending aortic aneurysm is slightly increased in size  since the prior study.  3. Calcific aortic and coronary atherosclerosis.  Original Report Authenticated By: Orlean Patten, M.D. PFT 04/21/13- spirometry within normal limits lung volumes within normal limits. Severe decrease in diffusion capacity. Insignificant response to bronchodilator.FVC 1.60/86%, FEV1 1.37/116%, FEV1/FVC 0.86, FEF 25-75% 2.10/133%. TLC 94%, DLCO 43%.  08/06/13- 40 yoF 1/2 PPD smoker followed for pulmonary fibrosis/ ILD, COPD complicated by CAD,PAD hx lung nodule, hx [place] exposure with long term  f/u here.  FOLLOWS FOR:  Breathing slightly worse since last OV.  SOB increased with exertion and coughing w/ clear mucus She continues  to smoke against all efforts and advice, and is not trying to quit. Admits maybe a little easier dyspnea on exertion, not much cough. Recognized one episode of reflux, takes omeprazole. Occasional chest tightness, nonradiating, no pain, not clearly exertional.  02/04/14- 55 yoF 1/2 PPD smoker followed for pulmonary fibrosis/ ILD, COPD complicated by CAD,PAD hx lung nodule, hx [place] exposure with long term f/u here. FOLLOWS FOR: Pt states her breathing is about the same as last visit; Activity causes her to have SOB. Less cough lately than in past. Little phlegm. No CP or palpitation. Was seen for [facility] f/u labs this AM by [facility]/ Dr Geoffry Paradise- they sent her here w/ EKG. She does not see their physician. Labs are forwarded to [facility] claims center. EKG 02/04/14- small inferior Q's, no acute. Mentions episode L hand numbness 2 weeks ago, lasted most of day. No syncope, blurring, HA. Continues BASA. Hx L CEA years ago/ Dr Donnetta Hutching. PFT-02/19/13- Normal flows, insignif response to BD, severely reduced DLCO. FVC 1.60/ 86%, FEV1 1.37/ 116%, FEV1/FVC 0.86, FEF25-75% 2.10/ 133%. TLC 94%, DLCO 43%. CT 02/20/13 IMPRESSION:  1. Mild progression of interstitial fibrosis with an appearance  most compatible with usual interstitial pneumonia.  2. Small ascending aortic aneurysm is slightly increased in size  since the prior study.  3. Calcific aortic and coronary atherosclerosis.  Original Report Authenticated By: Orlean Patten, M.D. CXR 02/04/14 IMPRESSION:  COPD with scarring.  Small left pleural effusion.  Electronically Signed  By: Franchot Gallo M.D.  On: 02/04/2014 09:40  03/18/14- 45 yoF 1/2 PPD smoker followed for pulmonary fibrosis/ ILD/UIP, COPD complicated by CAD/ hx MI,PAD hx lung nodule, hx [place]/ [place] dust exposure with long term f/u here. FOLLOWS FOR: uses her O2 about 3/4 the time only because someone is "nagging" her to use it. Pt feels like she does not  need the O2. Pt states her breathing is fine.    Daughter is here Walk Test on room air 03/18/14-  23 m, lowest 92% Had R carotid endarterectomy/ Dr Donnetta Hutching Denies significant cough, wheeze, phlegm, chest pain, palpitation.  [place] for [facility] f/u-02/04/14- EKG NSR, old INF MI, CMET (incl LFT, renal, glu) wnl Cr 0.74, CBC wnl.   Labs scanned to media. Spirometry 02/04/14- WNL FVC 1.47/ 88%, FEV1 1.19/ 98%, FEV1/FVC 0.73, FEF25-75% 1.42/ 162%   05/18/14- 85 yoF former smoker followed for pulmonary fibrosis/ ILD/UIP, COPD complicated by CAD/ hx MI,PAD hx lung nodule, hx [place]/ [place] dust exposure with long term f/u here. FOLLOWS FOR:  Pt revieved [name] and would like to discuss starting.  Breathing doing well, reports has been off balance over the past few months O2 was dc'd in May after [place]. Oxygenation has improved a bit since she quit smoking. Little routine cough or wheeze. Dyspnea noted with exertion, but she is less active since cautious about feeling unsteady. We again discussed [name] side effects/ GI. She has contact for their patient support program. She states she is willing to start and has her first shipment.  06/29/14- 56 yoF former smoker followed for pulmonary fibrosis/ ILD/UIP, COPD complicated by CAD/ hx MI,PAD hx lung nodule, dizziness, hx [place]/ [place] dust exposure with long term f/u here. Working on [employer] approval w/ [employer]. She completed 270  tab starter set- well tolerated up to 3 tabs TID, running out one week ago., but we think [facility] is hold-up for continuing Rx.  Has sun tan lotion .Had neurology workup for dizziness. Also saw ENT. MRI scan was recommended but she wanted to wait to see if the dizziness would get worse. No loss of hearing and no headache. Taking swim classes for exercise  12/28/14- 86 yoF former smoker followed for pulmonary fibrosis/ ILD/UIP, COPD complicated by CAD/ hx MI,PAD hx lung nodule, dizziness,  hx [place]/ [place] dust exposure with long term f/u here. FOLLOWS FOR:sob with exertion slightly worse,does fine sitting still,no wheezing,no cough,denies cp or tightness.Wants to know status of [name] process..Not taking yet. [name] formal start has been on hold because of insurance delay - being worked on. Walk Test 12/28/14- 2 laps x 185 ft, desat to 90% on room air, stopped because "tired"  Easier fatigue walking- primarily because her thighs begin to ache, even walking to the dumpster. Little cough or wheeze.  07/14/15- 36 yoF former smoker followed for pulmonary fibrosis/ ILD/UIP, COPD complicated by CAD/ hx MI,PAD hx lung nodule, dizziness, hx [place]/ [place] dust exposure with long term f/u here. Continues [name] begun 04/21/15 but only tolerating one or 2 times daily to avoid GI upset that she experienced with 3 times daily Aware of dyspnea on exertion and says her equilibrium is not good. She has a cane at home. Has handicapped parking.  Benefits from water aerobics 3 days per week. Little routine cough or wheeze. We reviewed her CT images with changes of UIP and atherosclerosis CT chest 01/04/15 IMPRESSION: 1. The appearance the lungs remains compatible with interstitial lung disease, and the pattern is most compatible with usual interstitial pneumonia (UIP), as discussed above. There has been very little change compared to the most recent prior study from 02/10/2012, suggesting a positive response to therapy. 2. Atherosclerosis, including three-vessel coronary artery disease. In addition, there is ectasia of the ascending thoracic aorta (4.3 x 4.1 cm), which is unchanged. Electronically Signed  By: Vinnie Langton M.D.  On: 01/04/2015 16:45  01/12/2016-82 year old female former smoker followed for pulmonary fibrosis/ILD/UIP, COPD, CAD/ hx MI, PAD, hx lung nodule, dizziness, history-year-old [place] dust exposure with long term  follow up here [name] begun 04/21/2015 , stopped due to heart burn, GI upset, disturbed sleep even taking only once daily FOLLOWS FOR: Pt states she stopped her [name] due to increase in heartburn,reflux; as well as not sleeping well while on taking it. Says her breathing is "good" but admits she walks little, partly because support equilibrium which has been evaluated. She also says she gets "exhausted" easily without describing claudication or chest pain. Little routine cough. No major respiratory infection problems this past winter.  Review of Systems-see HPI Constitutional:   No-   weight loss, night sweats, fevers, chills,+ fatigue, lassitude. HEENT:   No-  headaches, difficulty swallowing, tooth/dental problems, sore throat,       No-  sneezing, itching, ear ache, nasal congestion, post nasal drip,  CV:  No-   chest pain, orthopnea, PND, swelling in lower extremities, anasarca, dizziness, palpitations Resp: +shortness of breath with exertion or at rest.              No-   productive cough,   non-productive cough,  No-  coughing up of blood.              No-   change in color of  mucus.  No- wheezing.   Skin: No-   rash or lesions. GI:   heartburn, indigestion, no-abdominal pain, nausea, vomiting,  GU: MS:  No-   joint pain or swelling.  . Neuro- + dizziness Psych:  No- change in mood or affect. No depression or anxiety.  No memory loss.  Objective:   Physical Exam General- Alert, Oriented, Affect-appropriate, Distress- none acute, looks well. Skin- rash-none, lesions- none, excoriation- none Lymphadenopathy- none Head- atraumatic            Eyes- Gross vision intact, PERRLA, conjunctivae clear secretions            Ears- Hearing, canals normal            Nose- Clear, No-Septal dev, mucus, polyps, erosion, perforation             Throat- Mallampati II , mucosa clear , drainage- none, tonsils- atrophic;  dentures Neck- flexible , trachea midline, no stridor , thyroid nl, carotid +R  bruit Chest - symmetrical excursion , unlabored           Heart/CV- RRR , no murmur , no gallop  , no rub, nl s1 s2                           - JVD- none , edema- none, stasis changes- none, varices- none           Lung- +crackles to scapulae, unlabored at rest,  wheeze- none, cough- none , dullness-none, rub- none 96% RA           Chest wall-  Abd-  Br/ Gen/ Rectal- Not done, not indicated Extrem- cyanosis- none, clubbing- none, atrophy- none, strength- nl Neuro- +seems alert and sharp at this visit

## 2016-01-12 NOTE — Assessment & Plan Note (Signed)
She reports significantly improved quality of life without the GI symptoms she associated with [name]. At age 82 she may be better off without treatment for UIP. Plan-update CT chest for progression of disease

## 2016-01-12 NOTE — Assessment & Plan Note (Signed)
Stable aortic ectasia commented on last CT chest report Plan-this can be noted with next chest CT

## 2016-01-12 NOTE — Telephone Encounter (Signed)
Per 01/12/16 OV: Patient Instructions     Order- schedule High Resolution CT chest Dx UIP interstitial lung disease, aortic ectasia  --  Spoke with Sharyn Lull. She is calling to confirm we did stop pt esbriet. I see in note it states pt stopped her esbriet but in instructions do not see where it was actually d/c'd. Please advise Dr. Annamaria Boots thanks

## 2016-01-12 NOTE — Assessment & Plan Note (Signed)
Marked decrease in GI discomfort since she stopped [name]

## 2016-01-13 NOTE — Telephone Encounter (Signed)
Called pharmacy to confirm that pt is not taking esbriet, will be formally stopped after pending ct chest. Nothing further needed.

## 2016-01-13 NOTE — Telephone Encounter (Signed)
We were waiting for CT to see what her lungs look like before formally stopping.

## 2016-01-13 NOTE — Telephone Encounter (Signed)
CY please advise  

## 2016-01-24 ENCOUNTER — Inpatient Hospital Stay: Admission: RE | Admit: 2016-01-24 | Payer: Self-pay | Source: Ambulatory Visit

## 2016-01-25 ENCOUNTER — Ambulatory Visit (INDEPENDENT_AMBULATORY_CARE_PROVIDER_SITE_OTHER)
Admission: RE | Admit: 2016-01-25 | Discharge: 2016-01-25 | Disposition: A | Discharge status: Home / Self Care | Payer: Commercial Managed Care - PPO | Source: Ambulatory Visit | Attending: Internal Medicine | Admitting: Internal Medicine

## 2016-01-25 DIAGNOSIS — J849 Interstitial pulmonary disease, unspecified: Secondary | ICD-10-CM | POA: Diagnosis not present

## 2016-01-31 ENCOUNTER — Other Ambulatory Visit: Payer: Self-pay | Admitting: Internal Medicine

## 2016-01-31 DIAGNOSIS — R911 Solitary pulmonary nodule: Secondary | ICD-10-CM

## 2016-02-07 ENCOUNTER — Encounter: Payer: Self-pay | Admitting: Cardiology

## 2016-02-16 ENCOUNTER — Other Ambulatory Visit: Payer: Self-pay | Admitting: Internal Medicine

## 2016-02-21 ENCOUNTER — Encounter: Payer: Self-pay | Admitting: Internal Medicine

## 2016-02-21 ENCOUNTER — Ambulatory Visit (INDEPENDENT_AMBULATORY_CARE_PROVIDER_SITE_OTHER): Payer: PPO | Admitting: Internal Medicine

## 2016-02-21 VITALS — BP 136/94 | HR 86 | Temp 98.5°F | Resp 16 | Wt 148.0 lb

## 2016-02-21 DIAGNOSIS — G8929 Other chronic pain: Secondary | ICD-10-CM

## 2016-02-21 DIAGNOSIS — N3281 Overactive bladder: Secondary | ICD-10-CM

## 2016-02-21 DIAGNOSIS — R2689 Other abnormalities of gait and mobility: Secondary | ICD-10-CM

## 2016-02-21 DIAGNOSIS — I6523 Occlusion and stenosis of bilateral carotid arteries: Secondary | ICD-10-CM

## 2016-02-21 DIAGNOSIS — J309 Allergic rhinitis, unspecified: Secondary | ICD-10-CM | POA: Diagnosis not present

## 2016-02-21 DIAGNOSIS — M545 Low back pain: Secondary | ICD-10-CM

## 2016-02-21 DIAGNOSIS — K219 Gastro-esophageal reflux disease without esophagitis: Secondary | ICD-10-CM

## 2016-02-21 DIAGNOSIS — R7309 Other abnormal glucose: Secondary | ICD-10-CM

## 2016-02-21 DIAGNOSIS — F329 Major depressive disorder, single episode, unspecified: Secondary | ICD-10-CM

## 2016-02-21 DIAGNOSIS — F32A Depression, unspecified: Secondary | ICD-10-CM

## 2016-02-21 DIAGNOSIS — I2510 Atherosclerotic heart disease of native coronary artery without angina pectoris: Secondary | ICD-10-CM

## 2016-02-21 DIAGNOSIS — I10 Essential (primary) hypertension: Secondary | ICD-10-CM

## 2016-02-21 DIAGNOSIS — E785 Hyperlipidemia, unspecified: Secondary | ICD-10-CM

## 2016-02-21 MED ORDER — SERTRALINE HCL 50 MG PO TABS: 50.0000 mg | ORAL_TABLET | Freq: Every day | ORAL | Status: DC

## 2016-02-21 MED ORDER — CLOPIDOGREL BISULFATE 75 MG PO TABS: ORAL_TABLET | ORAL | Status: DC

## 2016-02-21 MED ORDER — TOLTERODINE TARTRATE 2 MG PO TABS: 2.0000 mg | ORAL_TABLET | Freq: Every day | ORAL | Status: DC

## 2016-02-21 MED ORDER — SIMVASTATIN 40 MG PO TABS: 40.0000 mg | ORAL_TABLET | Freq: Every day | ORAL | Status: DC

## 2016-02-21 MED ORDER — MONTELUKAST SODIUM 10 MG PO TABS: 10.0000 mg | ORAL_TABLET | Freq: Every day | ORAL | Status: DC

## 2016-02-21 MED ORDER — RANITIDINE HCL 150 MG PO TABS: ORAL_TABLET | ORAL | Status: DC

## 2016-02-21 NOTE — Assessment & Plan Note (Signed)
Intermittent Related to house work or activity Rest and over-the-counter pain medication improves pain Deferred further evaluation or treatment at this time

## 2016-02-21 NOTE — Patient Instructions (Addendum)

## 2016-02-21 NOTE — Assessment & Plan Note (Signed)
GERD controlled Continue daily medication  

## 2016-02-21 NOTE — Progress Notes (Signed)
Pre visit review using our clinic review tool, if applicable. No additional management support is needed unless otherwise documented below in the visit note. 

## 2016-02-21 NOTE — Assessment & Plan Note (Signed)
Controlled, stable Continue current dose of sertraline 

## 2016-02-21 NOTE — Assessment & Plan Note (Signed)
Check lipid panel Taking simvastatin 40 mg daily

## 2016-02-21 NOTE — Assessment & Plan Note (Signed)
No chest pain or shortness of breath Continue aspirin, Plavix, statin

## 2016-02-21 NOTE — Assessment & Plan Note (Signed)
Has been evaluated by neurology in the past Imaging in the past has been normal Stressed [facility] Continue regular exercise Take precautions against falling

## 2016-02-21 NOTE — Assessment & Plan Note (Signed)
Following with vascular surgery Taking baby aspirin and Plavix Continue statin

## 2016-02-21 NOTE — Progress Notes (Signed)
Subjective:    Patient ID: Linda Kelly, female    DOB: [birth year], 82 y.o.   MRN: [id number]  HPI She is here to establish with a new pcp.    Poor equilibrium:  She feels off balance often and laying down helps.  She has seen neuro in the past and imaging was performed. He recommended vestibular rehabilitation .  She denies dizziness. She gets pain in her legs after walking more than one block.  This limits her ability to ambulate. She does do some water exercises. She [facility], but does not take these consistently. She thinks doing those has helped her balance, but has not documented a while.  GERD:  She was having GERD daily, but since starting a vinegar concoction three times a day it has gone away.  She is taking the zantac twice a day.   Frequent bowel movements, hemorrhoids:  She uses prep-H and tucks for her hemorrhoids and that helps.   She does have a history of IBS.  Nocturia:  She gets up 4-6 times a night.  This is not new.  She denies frequent urination during the day.  She drinks more fluids at night - she tries to stop at 9 pm.  She has been on medications in the past prescribed by her gynecologist, but they did not seem to be effective. She does not recall which medications were. She would be willing to try additional medication.  Back pain;  She has pain in her back when she does house work or is busy doing things.  Laying down or taking asa or tylenol helps. She has not had back pain evaluated and feels that she is able to control it with doing what she does.  Occasionally her ears feel like they ar popping out.  It comes and goes.    Pain in back of head: Many years ago, 15 years ago, she started having pain in her back of her head. She has episodes of pain that occur about once a year.  No one has been able to tell her the cause of the pain.  The pain can last minutes or hours.  The pain is severe.  She has had imaging in the past and  it was normal.    She does water aerobics three times a week.  She occasionally does chair exercise classes.   CAD, Hypertension: She is taking her medication daily. She is compliant with a low sodium diet.  She denies chest pain, palpitations, edema, shortness of breath and lightheadedness. She is exercising regularly.    Medications and allergies reviewed with patient and updated if appropriate.  Patient Active Problem List   Diagnosis Date Noted  . Carotid stenosis 03/03/2014  . Thoracic aorta atherosclerosis ([place]) 02/04/2014  . Change in bowel habits 05/22/2011  . URINARY INCONTINENCE 12/22/2010  . ABDOMINAL BRUIT 08/10/2010  . HEARING LOSS 12/08/2009  . FASTING HYPERGLYCEMIA 11/15/2009  . DEPRESSION 08/01/2009  . GERD 08/01/2009  . IRRITABLE BOWEL SYNDROME 12/27/2008  . TINNITUS, CHRONIC 11/05/2008  . HEARING LOSS, HIGH FREQUENCY 11/05/2008  . HIATAL HERNIA 08/11/2008  . OTHER NONTHROMBOCYTOPENIC PURPURAS 06/08/2008  . CAROTID ARTERY DISEASE 06/08/2008  . ORTHOSTATIC HYPOTENSION 06/08/2008  . Idiopathic interstitial pneumonia/ UIP 11/20/2007  . PULMONARY NODULE, SOLITARY 11/20/2007  . OBESITY, MILD 11/19/2007  . HYPERLIPIDEMIA 10/09/2007  . Tobacco abuse, in remission 10/09/2007  . Essential hypertension 10/09/2007  . Coronary atherosclerosis 10/09/2007  .  Peripheral vascular disease ([place]) 10/09/2007  . COPD with emphysema ([place]) 10/09/2007  . VERTIGO 03/31/2007  . HEADACHE 03/31/2007    Current Outpatient Prescriptions on File Prior to Visit  Medication Sig Dispense Refill  . aspirin EC 81 MG tablet Take 81 mg by mouth daily.    . cholecalciferol (VITAMIN D) 1000 UNITS tablet Take 1,000 Units by mouth daily.    . clopidogrel (PLAVIX) 75 MG tablet TAKE 1 TABLET (75 MG TOTAL) BY MOUTH DAILY. 90 tablet 0  . folic acid (FOLVITE) A999333 MCG tablet Take 400 mcg by mouth daily.      Marland Kitchen loratadine (CLARITIN) 10 MG tablet Take 10 mg by mouth daily as needed for allergies.    .  montelukast (SINGULAIR) 10 MG tablet Take 1 tablet (10 mg total) by mouth at bedtime. 30 tablet 3  . Omega-3 Fatty Acids (FISH OIL) 1000 MG CAPS Take 1,000 mg by mouth daily.     . ranitidine (ZANTAC) 150 MG tablet TAKE 1 TABLET BY MOUTH TWICE DAILY 180 tablet 3  . sertraline (ZOLOFT) 50 MG tablet Take 1 tablet (50 mg total) by mouth daily. 90 tablet 3  . simvastatin (ZOCOR) 40 MG tablet TAKE 1 TABLET BY MOUTH EVERY DAY 90 tablet 0  . vitamin E 400 UNIT capsule Take 400 Units by mouth daily.     No current facility-administered medications on file prior to visit.    Past Medical History  Diagnosis Date  . PVD (peripheral vascular disease) ([place])   . Hyperlipidemia   . CAD (coronary artery disease)   . Tinnitus     chronic  . Internal hemorrhoid   . IBS (irritable bowel syndrome)   . High frequency hearing loss   . Chronic gastritis   . Benign neoplasm of esophagus   . Hiatal hernia   . Colon, diverticulosis   . Carotid artery disease ([place])   . Other nonthrombocytopenic purpuras   . Solitary pulmonary nodule   . Idiopathic pulmonary fibrosis   . COPD (chronic obstructive pulmonary disease) ([place])   . Vertigo   . GERD (gastroesophageal reflux disease)   . Anxiety and depression   . MI (myocardial infarction) ([place]) 1985  . Arthritis   . PONV (postoperative nausea and vomiting)   . Shortness of breath     with exertion  . Stroke [facility])     Hx: of several mini -strokes  . Depression   . Anxiety   . Cancer [facility])     Skin cancer on back  . CHF (congestive heart failure) [facility])     Past Surgical History  Procedure Laterality Date  . Foot surgery      bilateral  . Toe surgery      right foot  . Carotid endarterectomy      left side x 3, saphenous vein graft from left leg  . Tonsillectomy    . Shoulder surgery      left  . Dental surgery      2012   . Angioplasty  1997, 98, 99  . Upper gastrointestinal endoscopy  11-27-01, 08-18-08    Hiatal hernia, benign esophagus  neoplasia, gastritis, tortuous esophagus 27 Maloney dilation performed  . Colonoscopy  11-27-01, 10-30-05, 05-24-11    diverticulosis, hemorrhoids  . Colon surgery    . Cataract extraction w/ intraocular lens  implant, bilateral    . Breast surgery      Hx; of biopsy  . Tubal ligation    . Endarterectomy Right  03/03/2014    Procedure: RIGHT CAROTID ENDARTERECTOMY WITH PATCH ANGIOPLASTY;  Surgeon: Rosetta Posner, MD;  Location: [facility] OR;  Service: Vascular;  Laterality: Right;    Social History   Social History  . Marital Status: Widowed    Spouse Name: N/A  . Number of Children: 6  . Years of Education: N/A   Occupational History  . 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     Social History Main Topics  . Smoking status: Former Smoker -- 0.50 packs/day for 60 years    Types: Cigarettes    Quit date: 02/26/2014  . Smokeless tobacco: Never Used  . Alcohol Use: No  . Drug Use: No  . Sexual Activity: No   Other Topics Concern  . 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   Pt does not get regular exercise    Family History  Problem Relation Age of Onset  . Heart attack Father     deceased at 71, MGF  . Colon cancer Father   . Prostate cancer Father   . Heart disease Father   . Hyperlipidemia Father   . Heart disease Mother     deaceased at age 18  . Colitis Mother   . Stroke Mother   . Hyperlipidemia Mother   . Colitis Sister   . Hyperlipidemia Sister   . Other Brother     deceased age 33; war  . Hyperlipidemia Brother   . Breast cancer Maternal Aunt     great   . Lung cancer Paternal Aunt     great  . Vasculitis Son   . Hyperlipidemia Son   . Hypertension Son   . Heart attack Son   . COPD Neg Hx   . Asthma Neg Hx   . Cancer Sister     unknown  . Hyperlipidemia Sister   . Hyperlipidemia Daughter   . Hypertension Daughter     Review of Systems    Constitutional: Negative for fever and chills.  Respiratory: Negative for cough, shortness of breath and wheezing.   Cardiovascular: Negative for chest pain, palpitations and leg swelling.  Endocrine: Positive for cold intolerance.  Musculoskeletal: Positive for back pain.  Neurological: Positive for headaches (occ headaches , no regular headaches). Negative for dizziness and numbness.       Objective:   Filed Vitals:   02/21/16 1421  BP: 136/94  Pulse: 86  Temp: 98.5 F (36.9 C)  Resp: 16   Filed Weights   02/21/16 1421  Weight: 148 lb (67.132 kg)   Body mass index is 30.94 kg/(m^2).   Physical Exam Constitutional: Appears well-developed and well-nourished. No distress.  Neck: Neck supple. No tracheal deviation present. No thyromegaly present.  No carotid bruit. No cervical adenopathy.   Cardiovascular: Normal rate, regular rhythm and normal heart sounds.   No murmur heard.  No edema Pulmonary/Chest: Effort normal. No respiratory distress. Dry crackles on exam.No wheezes.       Assessment & Plan:   See Problem List for Assessment and Plan of chronic medical problems.  Follow-up in 6 months-wellness visit with Manuela Schwartz and follow-up with me

## 2016-02-21 NOTE — Assessment & Plan Note (Signed)
Frequent nocturia  - 4-6 times at night Has taken medication in the past, ? Names Trial of detrol 2 mg at bedtime Decrease evening fluid intake

## 2016-02-21 NOTE — Assessment & Plan Note (Addendum)
BP Readings from Last 3 Encounters:  02/21/16 136/94  01/12/16 122/78  10/12/15 144/80   Overall blood pressure seems to be controlled for her age Currently not on a blood pressure medication Continue current cardiac medications

## 2016-02-22 ENCOUNTER — Telehealth: Payer: Self-pay

## 2016-02-22 NOTE — Telephone Encounter (Signed)
Any reason listed for denial?

## 2016-02-22 NOTE — Telephone Encounter (Signed)
PA received for generic Plavix. [place] to initiated PA and was advised that medication was NOT covered, please advise on alternative. Thanks

## 2016-02-22 NOTE — Telephone Encounter (Signed)
Per [employer], pharmacy ran claim through [employer] Comp Case.  Pharmacy advised of same and updated claim was authorized.

## 2016-02-28 ENCOUNTER — Other Ambulatory Visit (INDEPENDENT_AMBULATORY_CARE_PROVIDER_SITE_OTHER): Payer: Commercial Managed Care - PPO

## 2016-02-28 DIAGNOSIS — J309 Allergic rhinitis, unspecified: Secondary | ICD-10-CM | POA: Diagnosis not present

## 2016-02-28 DIAGNOSIS — F32A Depression, unspecified: Secondary | ICD-10-CM

## 2016-02-28 DIAGNOSIS — I10 Essential (primary) hypertension: Secondary | ICD-10-CM

## 2016-02-28 DIAGNOSIS — N3281 Overactive bladder: Secondary | ICD-10-CM | POA: Diagnosis not present

## 2016-02-28 DIAGNOSIS — E785 Hyperlipidemia, unspecified: Secondary | ICD-10-CM | POA: Diagnosis not present

## 2016-02-28 DIAGNOSIS — R7309 Other abnormal glucose: Secondary | ICD-10-CM | POA: Diagnosis not present

## 2016-02-28 DIAGNOSIS — F329 Major depressive disorder, single episode, unspecified: Secondary | ICD-10-CM

## 2016-02-28 LAB — CBC WITH DIFFERENTIAL/PLATELET
BASOS ABS: 0 10*3/uL (ref 0.0–0.1)
Basophils Relative: 0.4 % (ref 0.0–3.0)
EOS ABS: 0.3 10*3/uL (ref 0.0–0.7)
Eosinophils Relative: 2.8 % (ref 0.0–5.0)
HEMATOCRIT: 40.6 % (ref 36.0–46.0)
Hemoglobin: 13.7 g/dL (ref 12.0–15.0)
LYMPHS PCT: 18.4 % (ref 12.0–46.0)
Lymphs Abs: 1.8 10*3/uL (ref 0.7–4.0)
MCHC: 33.8 g/dL (ref 30.0–36.0)
MCV: 92.2 fl (ref 78.0–100.0)
MONOS PCT: 9.6 % (ref 3.0–12.0)
Monocytes Absolute: 1 10*3/uL (ref 0.1–1.0)
NEUTROS ABS: 6.8 10*3/uL (ref 1.4–7.7)
Neutrophils Relative %: 68.8 % (ref 43.0–77.0)
PLATELETS: 232 10*3/uL (ref 150.0–400.0)
RBC: 4.4 Mil/uL (ref 3.87–5.11)
RDW: 14.8 % (ref 11.5–15.5)
WBC: 9.9 10*3/uL (ref 4.0–10.5)

## 2016-02-28 LAB — LIPID PANEL
CHOL/HDL RATIO: 4
Cholesterol: 155 mg/dL (ref 0–200)
HDL: 38.3 mg/dL — ABNORMAL LOW (ref >39.00)
LDL Cholesterol: 82 mg/dL (ref 0–99)
NONHDL: 116.99
Triglycerides: 176 mg/dL — ABNORMAL HIGH (ref 0.0–149.0)
VLDL: 35.2 mg/dL (ref 0.0–40.0)

## 2016-02-28 LAB — COMPREHENSIVE METABOLIC PANEL
ALK PHOS: 80 U/L (ref 39–117)
ALT: 9 U/L (ref 0–35)
AST: 15 U/L (ref 0–37)
Albumin: 4.2 g/dL (ref 3.5–5.2)
BILIRUBIN TOTAL: 0.6 mg/dL (ref 0.2–1.2)
BUN: 13 mg/dL (ref 6–23)
CALCIUM: 9.2 mg/dL (ref 8.4–10.5)
CO2: 30 meq/L (ref 19–32)
CREATININE: 0.94 mg/dL (ref 0.40–1.20)
Chloride: 102 mEq/L (ref 96–112)
GFR: 60.1 mL/min (ref >60.00)
GLUCOSE: 103 mg/dL — AB (ref 70–99)
Potassium: 4 mEq/L (ref 3.5–5.1)
Sodium: 141 mEq/L (ref 135–145)
TOTAL PROTEIN: 7.4 g/dL (ref 6.0–8.3)

## 2016-02-28 LAB — HEMOGLOBIN A1C: HEMOGLOBIN A1C: 5.4 % (ref 4.6–6.5)

## 2016-02-28 LAB — TSH: TSH: 1.92 u[IU]/mL (ref 0.35–4.50)

## 2016-02-29 ENCOUNTER — Encounter: Payer: Self-pay | Admitting: Internal Medicine

## 2016-03-07 ENCOUNTER — Telehealth: Payer: Self-pay

## 2016-03-07 NOTE — Telephone Encounter (Signed)
Spoke with Opal Sidles to advise of MDs response.

## 2016-03-07 NOTE — Telephone Encounter (Signed)
Wellness Coach with the [employer]) called and wanted [place] to know what happened today:   Pt was in swim class and had some pain in her left ear/temporal that radiated to her left side face. Pain was sharpe. Pt did have a headache afterward and it was noticed that patient had a little more gait instability than usual. Patient is there with Debbie right now and states that pt is feeling fuzzy.   Call: [phone] Dante Gang (pt swim instructor and will be with patient).

## 2016-03-07 NOTE — Telephone Encounter (Signed)
Should be evaluated in ED to rule out a stroke

## 2016-03-07 NOTE — Telephone Encounter (Addendum)
Pt is refusing the ED.   Pt wants an appt with Dr. Quay Burow.   [phone] is call back for pt.

## 2016-03-07 NOTE — Telephone Encounter (Signed)
Spoke with pt to inform that it is important that she be evaluated. Pt stated that she didn't want to go to the ED. Informed that Dr Quay Burow did not have any openings until the end of next week and we could get her in with another provider. She stated that she did not want to do that. Instructed to call back if there were any changes or if symptoms returned to go to the ED.

## 2016-03-08 NOTE — Telephone Encounter (Signed)
Spoke with pt. Schedule appt with Dr Quay Burow for 03/09/16 at 945

## 2016-03-08 NOTE — Telephone Encounter (Signed)
She if she can come in tomorrow at 9:45 am

## 2016-03-09 ENCOUNTER — Ambulatory Visit (INDEPENDENT_AMBULATORY_CARE_PROVIDER_SITE_OTHER): Payer: PPO | Admitting: Internal Medicine

## 2016-03-09 ENCOUNTER — Encounter: Payer: Self-pay | Admitting: Internal Medicine

## 2016-03-09 VITALS — BP 168/108 | HR 89 | Temp 98.1°F | Resp 16 | Wt 148.0 lb

## 2016-03-09 DIAGNOSIS — R51 Headache: Secondary | ICD-10-CM

## 2016-03-09 DIAGNOSIS — R519 Headache, unspecified: Secondary | ICD-10-CM

## 2016-03-09 DIAGNOSIS — I10 Essential (primary) hypertension: Secondary | ICD-10-CM

## 2016-03-09 NOTE — Progress Notes (Signed)
Pre visit review using our clinic review tool, if applicable. No additional management support is needed unless otherwise documented below in the visit note. 

## 2016-03-09 NOTE — Progress Notes (Signed)
Subjective:    Patient ID: Linda Kelly, female    DOB: 03-25-1928, 82 y.o.   MRN: [id number]  HPI She is here for an acute visit.   Two days ago while she was at the gym doing water areobics she had a pain on the left side of her face.  It was a light pain.  She then had a sharp pain that was like lightening from her left ear to the top of her head - that occurred twice.  this whole episode lasted about 5 minutes.  She did not have any other symptoms.  She was evaluated by a nurse there.  She did not have any dropping of the face.  Her speech was fine.  Her eyes were fine and her vision was fine.  Afterwards she felt tired, but otherwise normal.  She had worsening of her gait and felt a little fuzzy after the event.  Yesterday she felt normal.  We did recommend evaluation in the ED, but she did not feel that was necessary.    She does not check her BP at home.  [facility] but has never [facility] that.  Her BP has never been an issue.  Today she feels normal.  She is taking her medication daily as prescribed.  She denies weakness in her arms and legs.  She denies numbness/tingling in her arms and legs.   Medications and allergies reviewed with patient and updated if appropriate.  Patient Active Problem List   Diagnosis Date Noted  . Overactive bladder 02/21/2016  . Poor balance 02/21/2016  . Chronic lower back pain 02/21/2016  . Carotid stenosis 03/03/2014  . Thoracic aorta atherosclerosis ([place]) 02/04/2014  . Change in bowel habits 05/22/2011  . URINARY INCONTINENCE 12/22/2010  . ABDOMINAL BRUIT 08/10/2010  . FASTING HYPERGLYCEMIA 11/15/2009  . Depression 08/01/2009  . GERD 08/01/2009  . IRRITABLE BOWEL SYNDROME 12/27/2008  . TINNITUS, CHRONIC 11/05/2008  . HEARING LOSS, HIGH FREQUENCY 11/05/2008  . HIATAL HERNIA 08/11/2008  . OTHER NONTHROMBOCYTOPENIC PURPURAS 06/08/2008  . ORTHOSTATIC HYPOTENSION 06/08/2008  . Idiopathic interstitial pneumonia/ UIP 11/20/2007    . PULMONARY NODULE, SOLITARY 11/20/2007  . OBESITY, MILD 11/19/2007  . Hyperlipidemia 10/09/2007  . Tobacco abuse, in remission 10/09/2007  . Essential hypertension 10/09/2007  . Coronary atherosclerosis 10/09/2007  . Peripheral vascular disease ([place]) 10/09/2007  . COPD with emphysema ([place]) 10/09/2007  . VERTIGO 03/31/2007  . HEADACHE 03/31/2007    Current Outpatient Prescriptions on File Prior to Visit  Medication Sig Dispense Refill  . aspirin EC 81 MG tablet Take 81 mg by mouth daily.    . cholecalciferol (VITAMIN D) 1000 UNITS tablet Take 1,000 Units by mouth daily.    . clopidogrel (PLAVIX) 75 MG tablet TAKE 1 TABLET (75 MG TOTAL) BY MOUTH DAILY. 90 tablet 1  . folic acid (FOLVITE) A999333 MCG tablet Take 400 mcg by mouth daily.      Marland Kitchen loratadine (CLARITIN) 10 MG tablet Take 10 mg by mouth daily as needed for allergies.    . montelukast (SINGULAIR) 10 MG tablet Take 1 tablet (10 mg total) by mouth at bedtime. 90 tablet 3  . Omega-3 Fatty Acids (FISH OIL) 1000 MG CAPS Take 1,000 mg by mouth daily.     . ranitidine (ZANTAC) 150 MG tablet TAKE 1 TABLET BY MOUTH TWICE DAILY 180 tablet 3  . sertraline (ZOLOFT) 50 MG tablet Take 1 tablet (50 mg total) by mouth daily. Prompton  tablet 3  . simvastatin (ZOCOR) 40 MG tablet Take 1 tablet (40 mg total) by mouth daily. 90 tablet 3  . tolterodine (DETROL) 2 MG tablet Take 1 tablet (2 mg total) by mouth at bedtime. 30 tablet 3  . vitamin E 400 UNIT capsule Take 400 Units by mouth daily.     No current facility-administered medications on file prior to visit.    Past Medical History  Diagnosis Date  . PVD (peripheral vascular disease) ([place])   . Hyperlipidemia   . CAD (coronary artery disease)   . Tinnitus     chronic  . Internal hemorrhoid   . IBS (irritable bowel syndrome)   . High frequency hearing loss   . Chronic gastritis   . Benign neoplasm of esophagus   . Hiatal hernia   . Colon, diverticulosis   . Carotid artery disease ([place])   .  Other nonthrombocytopenic purpuras   . Solitary pulmonary nodule   . Idiopathic pulmonary fibrosis   . COPD (chronic obstructive pulmonary disease) ([place])   . Vertigo   . GERD (gastroesophageal reflux disease)   . Anxiety and depression   . MI (myocardial infarction) ([place]) 1985  . Arthritis   . PONV (postoperative nausea and vomiting)   . Shortness of breath     with exertion  . Stroke [facility])     Hx: of several mini -strokes  . Depression   . Anxiety   . Cancer [facility])     Skin cancer on back  . CHF (congestive heart failure) [facility])     Past Surgical History  Procedure Laterality Date  . Foot surgery      bilateral  . Toe surgery      right foot  . Carotid endarterectomy      left side x 3, saphenous vein graft from left leg  . Tonsillectomy    . Shoulder surgery      left  . Dental surgery      2012   . Angioplasty  1997, 98, 99  . Upper gastrointestinal endoscopy  11-27-01, 08-18-08    Hiatal hernia, benign esophagus neoplasia, gastritis, tortuous esophagus 60 Maloney dilation performed  . Colonoscopy  11-27-01, 10-30-05, 05-24-11    diverticulosis, hemorrhoids  . Colon surgery    . Cataract extraction w/ intraocular lens  implant, bilateral    . Breast surgery      Hx; of biopsy  . Tubal ligation    . Endarterectomy Right 03/03/2014    Procedure: RIGHT CAROTID ENDARTERECTOMY WITH PATCH ANGIOPLASTY;  Surgeon: Rosetta Posner, MD;  Location: [facility] OR;  Service: Vascular;  Laterality: Right;    Social History   Social History  . Marital Status: Widowed    Spouse Name: N/A  . Number of Children: 6  . Years of Education: N/A   Occupational History  . 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     Social History Main Topics  . Smoking status: Former Smoker -- 0.50 packs/day for 60 years    Types: Cigarettes    Quit date: 02/26/2014  . Smokeless tobacco: Never Used  . Alcohol Use: No  . Drug Use: No  . Sexual Activity: No   Other Topics Concern  . None   Social History  Narrative   World Psychologist, clinical   Goes to [place] twice a year to visit sister- 3-4 weeks travel there every year.   Pt does not get regular exercise  Family History  Problem Relation Age of Onset  . Heart attack Father     deceased at 53, MGF  . Colon cancer Father   . Prostate cancer Father   . Heart disease Father   . Hyperlipidemia Father   . Heart disease Mother     deaceased at age 62  . Colitis Mother   . Stroke Mother   . Hyperlipidemia Mother   . Colitis Sister   . Hyperlipidemia Sister   . Other Brother     deceased age 52; war  . Hyperlipidemia Brother   . Breast cancer Maternal Aunt     great   . Lung cancer Paternal Aunt     great  . Vasculitis Son   . Hyperlipidemia Son   . Hypertension Son   . Heart attack Son   . COPD Neg Hx   . Asthma Neg Hx   . Cancer Sister     unknown  . Hyperlipidemia Sister   . Hyperlipidemia Daughter   . Hypertension Daughter     Review of Systems  Constitutional: Negative for fever and chills.  HENT: Positive for ear pain (occasional - b/l ears).   Eyes: Negative for visual disturbance.  Respiratory: Negative for cough, shortness of breath and wheezing.   Cardiovascular: Negative for chest pain, palpitations and leg swelling.  Neurological: Negative for dizziness, speech difficulty, weakness, light-headedness, numbness and headaches.       Head pain that lasted at most 5 minutes       Objective:   Filed Vitals:   03/09/16 1000  BP: 168/108  Pulse: 89  Temp: 98.1 F (36.7 C)  Resp: 16   Filed Weights   03/09/16 1000  Weight: 148 lb (67.132 kg)   Body mass index is 30.94 kg/(m^2).   Physical Exam  Constitutional: She is oriented to person, place, and time. She appears well-developed and well-nourished. No distress.  Cardiovascular: Normal rate, regular rhythm and normal heart sounds.   Pulmonary/Chest: Effort normal and breath sounds normal. No respiratory distress. She has no wheezes.    Musculoskeletal: She exhibits no edema.  Neurological: She is alert and oriented to person, place, and time.  Normal strength and sensation in arms and legs  Skin: She is not diaphoretic.  Psychiatric: She has a normal mood and affect. Her behavior is normal. Judgment and thought content normal.       Assessment & Plan:    See Problem List for Assessment and Plan of chronic medical problems.

## 2016-03-09 NOTE — Patient Instructions (Signed)
[facility].  If it is elevated please call.   Monitor closely for any concerning symptoms - if you have any please call.     Medications reviewed and updated.  No changes recommended at this time.

## 2016-03-10 DIAGNOSIS — R51 Headache: Secondary | ICD-10-CM

## 2016-03-10 DIAGNOSIS — R519 Headache, unspecified: Secondary | ICD-10-CM | POA: Insufficient documentation

## 2016-03-10 NOTE — Assessment & Plan Note (Signed)
?   Nerve related pain - I do not think this was a TIA or stroke, but it is concerning with her history.  She is already taking ASA 81 mg daily and plavix and is on her statin No change in meds Monitor BP closely - will treat if needed If she experiences any of these symptoms again she will need imaging She agrees with just monitoring for now

## 2016-03-10 NOTE — Assessment & Plan Note (Signed)
History of htn, but has been controlled without medication Elevated here today, which is concerning She will start monitoring her BP at least three times a week at the [facility]  If it is elevated she will make an appt and we can start her on a medication She understands the risk of having elevated BP or spikes in her BP

## 2016-03-22 ENCOUNTER — Other Ambulatory Visit: Payer: Self-pay | Admitting: *Deleted

## 2016-03-22 DIAGNOSIS — J309 Allergic rhinitis, unspecified: Secondary | ICD-10-CM

## 2016-03-22 MED ORDER — MONTELUKAST SODIUM 10 MG PO TABS: 10.0000 mg | ORAL_TABLET | Freq: Every day | ORAL | Status: DC

## 2016-03-27 ENCOUNTER — Ambulatory Visit (INDEPENDENT_AMBULATORY_CARE_PROVIDER_SITE_OTHER): Payer: PPO | Admitting: Internal Medicine

## 2016-03-27 ENCOUNTER — Other Ambulatory Visit: Payer: Self-pay | Admitting: Internal Medicine

## 2016-03-27 ENCOUNTER — Ambulatory Visit (INDEPENDENT_AMBULATORY_CARE_PROVIDER_SITE_OTHER)
Admission: RE | Admit: 2016-03-27 | Discharge: 2016-03-27 | Disposition: A | Discharge status: Home / Self Care | Payer: PPO | Source: Ambulatory Visit | Attending: Internal Medicine | Admitting: Internal Medicine

## 2016-03-27 ENCOUNTER — Encounter: Payer: Self-pay | Admitting: Internal Medicine

## 2016-03-27 VITALS — BP 140/82 | HR 92 | Temp 97.9°F | Resp 18 | Wt 147.0 lb

## 2016-03-27 DIAGNOSIS — M545 Low back pain, unspecified: Secondary | ICD-10-CM

## 2016-03-27 NOTE — Progress Notes (Signed)
Subjective:    Patient ID: Linda Kelly, female    DOB: 1928-06-07, 82 y.o.   MRN: [id number]  HPI She is here for an acute visit.    Hip pain, left, lower back pain: She fell about one month ago.  She landed on her left hip and left arm.  She did not hip pain at that time.  Last Thursday she started to have "left hip" pain.  It started in the middle of the night.  She had difficulty walking.  It was in her left buttock region and left posterior hip region.  The pain does not radiate down her leg.   She has taken tylenol and it did not help.  Laying down and sitting down makes the pain go away.  Being up and walking causes pain.  She does get some lower back pain on a regular basis depending on what she is doing.  She thinks the pain is getting better over the past couple of days just with taking it easy.  She denies prior episodes similar to this in the past.    One night she experienced some numbness/tingling in the left leg, but she has not had that since.  She denies weakness in her legs.  She has not tried anything else besides tylenol for the pain.   Medications and allergies reviewed with patient and updated if appropriate.  Patient Active Problem List   Diagnosis Date Noted  . Cephalalgia 03/10/2016  . Overactive bladder 02/21/2016  . Poor balance 02/21/2016  . Chronic lower back pain 02/21/2016  . Carotid stenosis 03/03/2014  . Thoracic aorta atherosclerosis ([place]) 02/04/2014  . Change in bowel habits 05/22/2011  . URINARY INCONTINENCE 12/22/2010  . ABDOMINAL BRUIT 08/10/2010  . FASTING HYPERGLYCEMIA 11/15/2009  . Depression 08/01/2009  . GERD 08/01/2009  . IRRITABLE BOWEL SYNDROME 12/27/2008  . TINNITUS, CHRONIC 11/05/2008  . HEARING LOSS, HIGH FREQUENCY 11/05/2008  . HIATAL HERNIA 08/11/2008  . OTHER NONTHROMBOCYTOPENIC PURPURAS 06/08/2008  . ORTHOSTATIC HYPOTENSION 06/08/2008  . Idiopathic interstitial pneumonia/ UIP 11/20/2007  . PULMONARY NODULE, SOLITARY  11/20/2007  . OBESITY, MILD 11/19/2007  . Hyperlipidemia 10/09/2007  . Tobacco abuse, in remission 10/09/2007  . Essential hypertension 10/09/2007  . Coronary atherosclerosis 10/09/2007  . Peripheral vascular disease ([place]) 10/09/2007  . COPD with emphysema ([place]) 10/09/2007  . VERTIGO 03/31/2007  . HEADACHE 03/31/2007    Current Outpatient Prescriptions on File Prior to Visit  Medication Sig Dispense Refill  . aspirin EC 81 MG tablet Take 81 mg by mouth daily.    . cholecalciferol (VITAMIN D) 1000 UNITS tablet Take 1,000 Units by mouth daily.    . clopidogrel (PLAVIX) 75 MG tablet TAKE 1 TABLET (75 MG TOTAL) BY MOUTH DAILY. 90 tablet 1  . folic acid (FOLVITE) A999333 MCG tablet Take 400 mcg by mouth daily.      Marland Kitchen loratadine (CLARITIN) 10 MG tablet Take 10 mg by mouth daily as needed for allergies.    . montelukast (SINGULAIR) 10 MG tablet Take 1 tablet (10 mg total) by mouth at bedtime. 90 tablet 3  . Omega-3 Fatty Acids (FISH OIL) 1000 MG CAPS Take 1,000 mg by mouth daily.     . ranitidine (ZANTAC) 150 MG tablet TAKE 1 TABLET BY MOUTH TWICE DAILY 180 tablet 3  . sertraline (ZOLOFT) 50 MG tablet Take 1 tablet (50 mg total) by mouth daily. 90 tablet 3  . simvastatin (ZOCOR) 40 MG tablet Take 1 tablet (40 mg total)  by mouth daily. 90 tablet 3  . tolterodine (DETROL) 2 MG tablet Take 1 tablet (2 mg total) by mouth at bedtime. 30 tablet 3  . vitamin E 400 UNIT capsule Take 400 Units by mouth daily.     No current facility-administered medications on file prior to visit.    Past Medical History  Diagnosis Date  . PVD (peripheral vascular disease) ([place])   . Hyperlipidemia   . CAD (coronary artery disease)   . Tinnitus     chronic  . Internal hemorrhoid   . IBS (irritable bowel syndrome)   . High frequency hearing loss   . Chronic gastritis   . Benign neoplasm of esophagus   . Hiatal hernia   . Colon, diverticulosis   . Carotid artery disease ([place])   . Other nonthrombocytopenic  purpuras   . Solitary pulmonary nodule   . Idiopathic pulmonary fibrosis   . COPD (chronic obstructive pulmonary disease) ([place])   . Vertigo   . GERD (gastroesophageal reflux disease)   . Anxiety and depression   . MI (myocardial infarction) ([place]) 1985  . Arthritis   . PONV (postoperative nausea and vomiting)   . Shortness of breath     with exertion  . Stroke [facility])     Hx: of several mini -strokes  . Depression   . Anxiety   . Cancer [facility])     Skin cancer on back  . CHF (congestive heart failure) [facility])     Past Surgical History  Procedure Laterality Date  . Foot surgery      bilateral  . Toe surgery      right foot  . Carotid endarterectomy      left side x 3, saphenous vein graft from left leg  . Tonsillectomy    . Shoulder surgery      left  . Dental surgery      2012   . Angioplasty  1997, 98, 99  . Upper gastrointestinal endoscopy  11-27-01, 08-18-08    Hiatal hernia, benign esophagus neoplasia, gastritis, tortuous esophagus 74 Maloney dilation performed  . Colonoscopy  11-27-01, 10-30-05, 05-24-11    diverticulosis, hemorrhoids  . Colon surgery    . Cataract extraction w/ intraocular lens  implant, bilateral    . Breast surgery      Hx; of biopsy  . Tubal ligation    . Endarterectomy Right 03/03/2014    Procedure: RIGHT CAROTID ENDARTERECTOMY WITH PATCH ANGIOPLASTY;  Surgeon: Rosetta Posner, MD;  Location: [facility] OR;  Service: Vascular;  Laterality: Right;    Social History   Social History  . Marital Status: Widowed    Spouse Name: N/A  . Number of Children: 6  . Years of Education: N/A   Occupational History  . 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     Social History Main Topics  . Smoking status: Former Smoker -- 0.50 packs/day for 60 years    Types: Cigarettes    Quit date: 02/26/2014  . Smokeless tobacco: Never Used  . Alcohol Use: No  . Drug Use: No  . Sexual Activity: No   Other Topics Concern  . None   Social History Narrative   World Engineer, structural   Goes to [place] twice a year to visit sister- 3-4 weeks travel there every year.   Pt does not get regular exercise    Family History  Problem Relation Age of Onset  . Heart attack Father  deceased at 7, MGF  . Colon cancer Father   . Prostate cancer Father   . Heart disease Father   . Hyperlipidemia Father   . Heart disease Mother     deaceased at age 38  . Colitis Mother   . Stroke Mother   . Hyperlipidemia Mother   . Colitis Sister   . Hyperlipidemia Sister   . Other Brother     deceased age 65; war  . Hyperlipidemia Brother   . Breast cancer Maternal Aunt     great   . Lung cancer Paternal Aunt     great  . Vasculitis Son   . Hyperlipidemia Son   . Hypertension Son   . Heart attack Son   . COPD Neg Hx   . Asthma Neg Hx   . Cancer Sister     unknown  . Hyperlipidemia Sister   . Hyperlipidemia Daughter   . Hypertension Daughter     Review of Systems  Cardiovascular: Negative for leg swelling.  Musculoskeletal: Positive for back pain and arthralgias.  Neurological: Positive for numbness (once at night - not since then). Negative for weakness.       Objective:   Filed Vitals:   03/27/16 0936  BP: 140/82  Pulse: 92  Temp: 97.9 F (36.6 C)  Resp: 18   Filed Weights   03/27/16 0936  Weight: 147 lb (66.679 kg)   Body mass index is 30.73 kg/(m^2).   Physical Exam  Constitutional: She appears well-developed and well-nourished. No distress.  HENT:  Head: Normocephalic and atraumatic.  Musculoskeletal: She exhibits no edema.  Area of pain is left lower back region - slight tenderness with palpation.  No tenderness lateral aspect of left hip  Neurological: She exhibits normal muscle tone. Coordination normal.  Normal sensation and strength in LE  Skin: She is not diaphoretic.          Assessment & Plan:   See Problem List for Assessment and Plan of chronic medical problems.

## 2016-03-27 NOTE — Patient Instructions (Signed)
xrays were ordered of her hips and back.  Test(s) ordered today. Your results will be released to [place] (or called to you) after review, usually within 72hours after test completion. If any changes need to be made, you will be notified at that same time.  Continue to take tylenol as needed.  Try using heat or ice.    Please followup as needed

## 2016-03-27 NOTE — Progress Notes (Signed)
Pre visit review using our clinic review tool, if applicable. No additional management support is needed unless otherwise documented below in the visit note. 

## 2016-03-28 DIAGNOSIS — M545 Low back pain, unspecified: Secondary | ICD-10-CM | POA: Insufficient documentation

## 2016-03-28 NOTE — Assessment & Plan Note (Signed)
Pain in left lower back, numbness/tingling in left leg once, but no radiculopathy Will check hip, back xrays continue tylenol, she does not feel she needs anything stronger Will try heat/ice Deferred PT and referral to ortho/sports medicine at this time Pain is improving, if it does not continue to improve she will let me know

## 2016-04-12 ENCOUNTER — Telehealth: Payer: Self-pay | Admitting: Internal Medicine

## 2016-04-12 ENCOUNTER — Other Ambulatory Visit: Payer: Self-pay | Admitting: Internal Medicine

## 2016-04-12 DIAGNOSIS — Z1231 Encounter for screening mammogram for malignant neoplasm of breast: Secondary | ICD-10-CM

## 2016-04-12 NOTE — Telephone Encounter (Signed)
Spoke with pt, states that her CT on 6/30 needs to be filed under her [employer] insurance and not her healthte[employer] advantage insurance. Forwarding to [facility] to look into as these CT's are precertified through insurance before scheduling. PCC's please advise.  Thanks!

## 2016-04-13 NOTE — Telephone Encounter (Signed)
Cancelled precert with healthteam and precerted ct through her [employer] inurance Linda Kelly

## 2016-04-14 ENCOUNTER — Emergency Department (HOSPITAL_COMMUNITY)
Admission: EM | Admit: 2016-04-14 | Discharge: 2016-04-14 | Disposition: A | Discharge status: Home / Self Care | Payer: PPO | Attending: Dermatology | Admitting: Dermatology

## 2016-04-14 ENCOUNTER — Encounter (HOSPITAL_COMMUNITY): Payer: Self-pay | Admitting: Family Medicine

## 2016-04-14 DIAGNOSIS — Y999 Unspecified external cause status: Secondary | ICD-10-CM | POA: Insufficient documentation

## 2016-04-14 DIAGNOSIS — I509 Heart failure, unspecified: Secondary | ICD-10-CM | POA: Insufficient documentation

## 2016-04-14 DIAGNOSIS — Z8673 Personal history of transient ischemic attack (TIA), and cerebral infarction without residual deficits: Secondary | ICD-10-CM | POA: Insufficient documentation

## 2016-04-14 DIAGNOSIS — Y939 Activity, unspecified: Secondary | ICD-10-CM | POA: Diagnosis not present

## 2016-04-14 DIAGNOSIS — Z87891 Personal history of nicotine dependence: Secondary | ICD-10-CM | POA: Insufficient documentation

## 2016-04-14 DIAGNOSIS — Y929 Unspecified place or not applicable: Secondary | ICD-10-CM | POA: Insufficient documentation

## 2016-04-14 DIAGNOSIS — Z85828 Personal history of other malignant neoplasm of skin: Secondary | ICD-10-CM | POA: Diagnosis not present

## 2016-04-14 DIAGNOSIS — S61251A Open bite of left index finger without damage to nail, initial encounter: Secondary | ICD-10-CM | POA: Insufficient documentation

## 2016-04-14 DIAGNOSIS — I252 Old myocardial infarction: Secondary | ICD-10-CM | POA: Diagnosis not present

## 2016-04-14 DIAGNOSIS — I2510 Atherosclerotic heart disease of native coronary artery without angina pectoris: Secondary | ICD-10-CM | POA: Diagnosis not present

## 2016-04-14 DIAGNOSIS — J449 Chronic obstructive pulmonary disease, unspecified: Secondary | ICD-10-CM | POA: Diagnosis not present

## 2016-04-14 DIAGNOSIS — W5501XA Bitten by cat, initial encounter: Secondary | ICD-10-CM | POA: Insufficient documentation

## 2016-04-14 DIAGNOSIS — E785 Hyperlipidemia, unspecified: Secondary | ICD-10-CM | POA: Insufficient documentation

## 2016-04-14 DIAGNOSIS — Z5321 Procedure and treatment not carried out due to patient leaving prior to being seen by health care provider: Secondary | ICD-10-CM | POA: Insufficient documentation

## 2016-04-14 DIAGNOSIS — S61253A Open bite of left middle finger without damage to nail, initial encounter: Secondary | ICD-10-CM | POA: Insufficient documentation

## 2016-04-14 NOTE — ED Notes (Signed)
Pt here for cat bite to left middle and pointer finger. sts the cats have not had their rabies in a long time but also don't go outside. Pt sats low. sts she has been very upset lately and had anxiety attack last night but denies home 02 use. sats 89%.

## 2016-04-14 NOTE — ED Notes (Signed)
Pt has decided not to be seen in the ED.Advised pt she should wait since she is her. Pt still decided not to be seen,.

## 2016-04-23 ENCOUNTER — Telehealth: Payer: Self-pay | Admitting: Internal Medicine

## 2016-04-26 NOTE — Telephone Encounter (Signed)
Opened in error

## 2016-04-27 ENCOUNTER — Ambulatory Visit (INDEPENDENT_AMBULATORY_CARE_PROVIDER_SITE_OTHER)
Admission: RE | Admit: 2016-04-27 | Discharge: 2016-04-27 | Disposition: A | Discharge status: Home / Self Care | Payer: PPO | Source: Ambulatory Visit | Attending: Internal Medicine | Admitting: Internal Medicine

## 2016-04-27 ENCOUNTER — Encounter: Payer: Self-pay | Admitting: Vascular Surgery

## 2016-04-27 DIAGNOSIS — R911 Solitary pulmonary nodule: Secondary | ICD-10-CM

## 2016-05-04 ENCOUNTER — Other Ambulatory Visit: Payer: Self-pay | Admitting: Internal Medicine

## 2016-05-04 DIAGNOSIS — J849 Interstitial pulmonary disease, unspecified: Secondary | ICD-10-CM

## 2016-05-07 ENCOUNTER — Other Ambulatory Visit: Payer: Self-pay | Admitting: *Deleted

## 2016-05-07 DIAGNOSIS — I6523 Occlusion and stenosis of bilateral carotid arteries: Secondary | ICD-10-CM

## 2016-05-08 ENCOUNTER — Ambulatory Visit (HOSPITAL_COMMUNITY)
Admission: RE | Admit: 2016-05-08 | Discharge: 2016-05-08 | Disposition: A | Discharge status: Home / Self Care | Payer: PPO | Source: Ambulatory Visit | Attending: Vascular Surgery | Admitting: Vascular Surgery

## 2016-05-08 ENCOUNTER — Ambulatory Visit: Payer: PPO | Admitting: Vascular Surgery

## 2016-05-08 DIAGNOSIS — I6523 Occlusion and stenosis of bilateral carotid arteries: Secondary | ICD-10-CM | POA: Diagnosis not present

## 2016-05-15 ENCOUNTER — Ambulatory Visit
Admission: RE | Admit: 2016-05-15 | Discharge: 2016-05-15 | Disposition: A | Discharge status: Home / Self Care | Payer: PPO | Source: Ambulatory Visit | Attending: Internal Medicine | Admitting: Internal Medicine

## 2016-05-15 DIAGNOSIS — Z1231 Encounter for screening mammogram for malignant neoplasm of breast: Secondary | ICD-10-CM

## 2016-05-28 ENCOUNTER — Ambulatory Visit (INDEPENDENT_AMBULATORY_CARE_PROVIDER_SITE_OTHER): Payer: Commercial Managed Care - PPO | Admitting: Internal Medicine

## 2016-05-28 ENCOUNTER — Encounter: Payer: Self-pay | Admitting: Internal Medicine

## 2016-05-28 VITALS — BP 118/62 | HR 94 | Ht <= 58 in | Wt 142.0 lb

## 2016-05-28 DIAGNOSIS — J84111 Idiopathic interstitial pneumonia, not otherwise specified: Secondary | ICD-10-CM

## 2016-05-28 DIAGNOSIS — J84112 Idiopathic pulmonary fibrosis: Secondary | ICD-10-CM

## 2016-05-28 DIAGNOSIS — R918 Other nonspecific abnormal finding of lung field: Secondary | ICD-10-CM | POA: Diagnosis not present

## 2016-05-28 DIAGNOSIS — I2510 Atherosclerotic heart disease of native coronary artery without angina pectoris: Secondary | ICD-10-CM | POA: Diagnosis not present

## 2016-05-28 DIAGNOSIS — R911 Solitary pulmonary nodule: Secondary | ICD-10-CM

## 2016-05-28 NOTE — Patient Instructions (Signed)
Sample Anoro    Inhale 1 puff, once daily    We hope this will open your lungs for les  s shortness of breath  Order- schedule High Resolution CT chest, no contrast- future 6 months    Dx UIP, lung nodules

## 2016-05-28 NOTE — Progress Notes (Signed)
Patient ID: Linda Kelly, female    DOB: 10-13-1928, 82 y.o.   MRN: [id number]  HPI 06/07/11- 49 yoF 1/2 PPD smoker followed for pulmonary fibrosis/ ILD, COPD complicated by CAD,PAD hx lung nodule, hx [employer] exposure with long term f/u here.   07/14/15- 18 yoF former smoker followed for pulmonary fibrosis/ ILD/UIP, COPD complicated by CAD/ hx MI,PAD hx lung nodule, dizziness, hx [employer] dust exposure with long term f/u here. Continues [name] begun 04/21/15 but only tolerating one or 2 times daily to avoid GI upset that she experienced with 3 times daily Aware of dyspnea on exertion and says her equilibrium is not good. She has a cane at home. Has handicapped parking.  Benefits from water aerobics 3 days per week. Little routine cough or wheeze. We reviewed her CT images with changes of UIP and atherosclerosis CT chest 01/04/15 IMPRESSION: 1. The appearance the lungs remains compatible with interstitial lung disease, and the pattern is most compatible with usual interstitial pneumonia (UIP), as discussed above. There has been very little change compared to the most recent prior study from 02/10/2012, suggesting a positive response to therapy. 2. Atherosclerosis, including three-vessel coronary artery disease. In addition, there is ectasia of the ascending thoracic aorta (4.3 x 4.1 cm), which is unchanged. Electronically Signed  By: Vinnie Langton M.D.  On: 01/04/2015 16:45  01/12/2016-82 year old female former smoker followed for pulmonary fibrosis/ILD/UIP, COPD, CAD/ hx MI, PAD, hx lung nodule, dizziness, history-year-old [employer] [place] dust exposure with long term follow up here [name] begun 04/21/2015 , stopped due to heart burn, GI upset, disturbed sleep even taking only once daily FOLLOWS FOR: Pt states she stopped her [name] due to increase in heartburn,reflux; as well as not sleeping well while on taking it. Says her breathing  is "good" but admits she walks little, partly because support equilibrium which has been evaluated. She also says she gets "exhausted" easily without describing claudication or chest pain. Little routine cough. No major respiratory infection problems this past winter.  05/28/2016-82 year old female former smoker followed for pulmonary fibrosis/ILD/UIP, COPD, CAD/history MI, PAD, history lung nodule, dizziness, [place] [employer] [place] dust exposure with long term follow up here FOLLOWS FOR: Pt has to stop and rest more often; gives out quickly.  [name] begun 04/21/2015 , stopped due to heart burn, GI upset, disturbed sleep even taking only once daily She paces herself more for household chores without any dramatic change. Denies chest pain or palpitation. Coughs some only once every 2 days or so. [facility] [employer] Wants to avoid home oxygen. Wants to try an inhaler-discussed. Children made her get rid of cats in her home to protect her breathing although she didn't think they cause problems. 04/27/2016 CT chest  IMPRESSION: 1. Severe chronic interstitial lung disease and pulmonary scarring. 2. Stable right middle lobe opacity, likely scarring change. Recommend continued surveillance. A followup CT scan in 6 months is suggested. 3. No new pulmonary lesions. Electronically Signed   By: Marijo Sanes M.D.   On: 04/27/2016 13:49  Review of Systems-see HPI Constitutional:   No-   weight loss, night sweats, fevers, chills,+ fatigue, lassitude. HEENT:   No-  headaches, difficulty swallowing, tooth/dental problems, sore throat,       No-  sneezing, itching, ear ache, nasal congestion, post nasal drip,  CV:  No-   chest pain, orthopnea, PND, swelling in lower extremities, anasarca, dizziness, palpitations Resp: +shortness of breath with  exertion or at rest.              No-   productive cough,   non-productive cough,  No-  coughing up of blood.              No-   change in color of  mucus.  No- wheezing.   Skin: No-   rash or lesions. GI:   heartburn, indigestion, no-abdominal pain, nausea, vomiting,  GU: MS:  No-   joint pain or swelling.  . Neuro- + dizziness Psych:  No- change in mood or affect. No depression or anxiety.  No memory loss.  Objective:   Physical Exam General- Alert, Oriented, Affect-appropriate, Distress- none acute, looks well. Skin- rash-none, lesions- none, excoriation- none Lymphadenopathy- none Head- atraumatic            Eyes- Gross vision intact, PERRLA, conjunctivae clear secretions            Ears- Hearing, canals normal            Nose- Clear, No-Septal dev, mucus, polyps, erosion, perforation             Throat- Mallampati II , mucosa clear , drainage- none, tonsils- atrophic;  dentures Neck- flexible , trachea midline, no stridor , thyroid nl, carotid +R bruit Chest - symmetrical excursion , unlabored           Heart/CV- RRR , no murmur , no gallop  , no rub, nl s1 s2                           - JVD- none , edema- none, stasis changes- none, varices- none           Lung- +crackles to scapulae, unlabored at rest,  wheeze- none, cough- none , dullness-none, rub- none, 94% RA           Chest wall-  Abd-  Br/ Gen/ Rectal- Not done, not indicated Extrem- cyanosis- none, clubbing- none, atrophy- none, strength- nl Neuro- +seems alert and sharp at this visit

## 2016-05-30 NOTE — Assessment & Plan Note (Signed)
Insignificant response to bronchodilator on PFT 2014. She would like to try an inhaler anyway. Plan-sample Anoro

## 2016-05-30 NOTE — Assessment & Plan Note (Signed)
Reminded her of potential cardiac contribution to dyspnea on exertion. She continues appropriate follow-up.

## 2016-05-30 NOTE — Assessment & Plan Note (Signed)
She is now 82 years old and we expect some dyspnea on exertion. She is certainly not progressing quickly. She may quality of life choice not to continue specific therapy for UIP because of GI upset-discussed.

## 2016-05-30 NOTE — Assessment & Plan Note (Signed)
This appears to be a scar. Continue surveillance incidental to management of her UIP

## 2016-05-31 ENCOUNTER — Encounter: Payer: Self-pay | Admitting: Vascular Surgery

## 2016-06-05 ENCOUNTER — Encounter: Payer: Self-pay | Admitting: Vascular Surgery

## 2016-06-05 ENCOUNTER — Ambulatory Visit (INDEPENDENT_AMBULATORY_CARE_PROVIDER_SITE_OTHER): Payer: PPO | Admitting: Vascular Surgery

## 2016-06-05 VITALS — BP 90/64 | HR 95 | Temp 97.4°F | Resp 16 | Ht <= 58 in | Wt 142.0 lb

## 2016-06-05 DIAGNOSIS — I6523 Occlusion and stenosis of bilateral carotid arteries: Secondary | ICD-10-CM

## 2016-06-05 NOTE — Progress Notes (Signed)
[facility] [place]  Patient name: Linda Kelly MRN: [id number] DOB: 10/21/1928 Sex: female  REASON FOR VISIT: Follow-up of carotid disease  HPI: Linda Kelly is a 82 y.o. female here today for follow-up of extracranial vascular occlusive disease. She is status post right carotid endarterectomy 2015 and is status post left carotid endarterectomy in 1995 with redo endarterectomies and 97 and 99. She looks quite good today. She reports stable pulmonary fibrosis. She's had no neurologic deficits  Past Medical History:  Diagnosis Date  . Anxiety   . Anxiety and depression   . Arthritis   . Benign neoplasm of esophagus   . CAD (coronary artery disease)   . Cancer [facility]    Skin cancer on back  . Carotid artery disease ([place])   . CHF (congestive heart failure) ([place])   . Chronic gastritis   . Colon, diverticulosis   . COPD (chronic obstructive pulmonary disease) ([place])   . Depression   . GERD (gastroesophageal reflux disease)   . Hiatal hernia   . High frequency hearing loss   . Hyperlipidemia   . IBS (irritable bowel syndrome)   . Idiopathic pulmonary fibrosis   . Internal hemorrhoid   . MI (myocardial infarction) ([place]) 1985  . Other nonthrombocytopenic purpuras   . PONV (postoperative nausea and vomiting)   . PVD (peripheral vascular disease) ([place])   . Shortness of breath    with exertion  . Solitary pulmonary nodule   . Stroke [facility])    Hx: of several mini -strokes  . Tinnitus    chronic  . Vertigo     Family History  Problem Relation Age of Onset  . Heart attack Father     deceased at 6, MGF  . Colon cancer Father   . Prostate cancer Father   . Heart disease Father   . Hyperlipidemia Father   . Heart disease Mother     deaceased at age 94  . Colitis Mother   . Stroke Mother   . Hyperlipidemia Mother   . Colitis Sister   . Hyperlipidemia Sister   . Other Brother     deceased age 6; war  . Hyperlipidemia Brother   . Breast cancer  Maternal Aunt     great   . Lung cancer Paternal Aunt     great  . Vasculitis Son   . Hyperlipidemia Son   . Hypertension Son   . Heart attack Son   . COPD Neg Hx   . Asthma Neg Hx   . Cancer Sister     unknown  . Hyperlipidemia Sister   . Hyperlipidemia Daughter   . Hypertension Daughter     SOCIAL HISTORY: Social History  Substance Use Topics  . Smoking status: Former Smoker    Packs/day: 0.50    Years: 60.00    Types: Cigarettes    Quit date: 02/26/2014  . Smokeless tobacco: Never Used  . Alcohol use No    No Known Allergies  Current Outpatient Prescriptions  Medication Sig Dispense Refill  . aspirin EC 81 MG tablet Take 81 mg by mouth daily.    . cholecalciferol (VITAMIN D) 1000 UNITS tablet Take 1,000 Units by mouth daily.    . clopidogrel (PLAVIX) 75 MG tablet TAKE 1 TABLET (75 MG TOTAL) BY MOUTH DAILY. 90 tablet 1  . folic acid (FOLVITE) A999333 MCG tablet Take 400 mcg by mouth daily.      Marland Kitchen loratadine (CLARITIN) 10 MG tablet  Take 10 mg by mouth daily as needed for allergies.    . montelukast (SINGULAIR) 10 MG tablet Take 1 tablet (10 mg total) by mouth at bedtime. 90 tablet 3  . Omega-3 Fatty Acids (FISH OIL) 1000 MG CAPS Take 1,000 mg by mouth daily.     . ranitidine (ZANTAC) 150 MG tablet TAKE 1 TABLET BY MOUTH TWICE DAILY 180 tablet 3  . sertraline (ZOLOFT) 50 MG tablet Take 1 tablet (50 mg total) by mouth daily. 90 tablet 3  . simvastatin (ZOCOR) 40 MG tablet Take 1 tablet (40 mg total) by mouth daily. 90 tablet 3  . tolterodine (DETROL) 2 MG tablet Take 1 tablet (2 mg total) by mouth at bedtime. 30 tablet 3  . vitamin E 400 UNIT capsule Take 400 Units by mouth daily.     No current facility-administered medications for this visit.     REVIEW OF SYSTEMS:  [X]  denotes positive finding, [ ]  denotes negative finding Cardiac  Comments:  Chest pain or chest pressure:    Shortness of breath upon exertion: x   Short of breath when lying flat:    Irregular heart  rhythm:        Vascular    Pain in calf, thigh, or hip brought on by ambulation: x   Pain in feet at night that wakes you up from your sleep:     Blood clot in your veins:    Leg swelling:         Pulmonary    Oxygen at home:    Productive cough:     Wheezing:  x       Neurologic    Sudden weakness in arms or legs:     Sudden numbness in arms or legs:  x   Sudden onset of difficulty speaking or slurred speech:    Temporary loss of vision in one eye:     Problems with dizziness:         Gastrointestinal    Blood in stool:     Vomited blood:         Genitourinary    Burning when urinating:     Blood in urine:        Psychiatric    Major depression:         Hematologic    Bleeding problems:    Problems with blood clotting too easily:        Skin    Rashes or ulcers:        Constitutional    Fever or chills:      PHYSICAL EXAM: Vitals:   06/05/16 1144 06/05/16 1147  BP: 100/80 90/64  Pulse: 95   Resp: 16   Temp: 97.4 F (36.3 C)   TempSrc: Oral   SpO2: 90%   Weight: 142 lb (64.4 kg)   Height: 4\' 10"  (1.473 m)     GENERAL: The patient is a well-nourished female, in no acute distress. The vital signs are documented above. CARDIAC: There is a regular rate and rhythm.  VASCULAR: 2+ radial pulses bilaterally. Carotid incisions well-healed with no bruits bilaterally PULMONARY: There is good air exchange MUSCULOSKELETAL: There are no major deformities or cyanosis. NEUROLOGIC: No focal weakness or paresthesias are detected. SKIN: There are no ulcers or rashes noted. PSYCHIATRIC: The patient has a normal affect.  DATA:  Carotid duplex from 05/08/2016 was reviewed with the patient. This reveals widely patent endarterectomy on the right and moderate restenosis on the left with no  change from her study from one year ago  MEDICAL ISSUES: Stable overall. We'll see her again in one year with repeat carotid duplex. Again reviewed symptoms of carotid disease with her  and she will notify us. Should this occur. If she has a major deficit she will report to [facility] hospital for evaluation    Linda Kelly [facility] [employer] [phone]

## 2016-06-07 ENCOUNTER — Other Ambulatory Visit: Payer: Self-pay | Admitting: Internal Medicine

## 2016-06-18 ENCOUNTER — Other Ambulatory Visit: Payer: Self-pay | Admitting: Internal Medicine

## 2016-06-20 ENCOUNTER — Telehealth: Payer: Self-pay | Admitting: Internal Medicine

## 2016-06-20 MED ORDER — UMECLIDINIUM-VILANTEROL 62.5-25 MCG/INH IN AEPB: 1.0000 | INHALATION_SPRAY | Freq: Every day | RESPIRATORY_TRACT | 11 refills | Status: DC

## 2016-06-20 NOTE — Addendum Note (Signed)
Addended by: Mena Goes on: 06/20/2016 04:15 PM   Modules accepted: Orders

## 2016-06-20 NOTE — Telephone Encounter (Signed)
Spoke with the pt  She reports that Anoro sample really helped improve her breathing  I have sent rx to her pharm  Nothing further needed

## 2016-07-11 NOTE — Progress Notes (Signed)
HPI: FU carotid artery disease, CAD, HLD, pulmonary fibrosis. Her cardiac issues date back to 89 when she had an MI. She was treated with a balloon angioplasty at that time. Repeat cath July 1985 with patent RCA. She has been stable since then from a cardiac standpoint. Last stress myoview in October of 2010 showed inferior and inferolateral infarct but no significant ischemia. Ejection fraction was 60%. Renal dopplers 12/12 showed stable 1-59 bilateral renal artery stenosis. Patient had a right carotid endarterectomy in May 2015. Since last seen, the patient has dyspnea with more extreme activities but not with routine activities. It is relieved with rest. It is not associated with chest pain. There is no orthopnea, PND or pedal edema. There is no syncope or palpitations. There is no exertional chest pain.   Current Outpatient Prescriptions  Medication Sig Dispense Refill  . aspirin EC 81 MG tablet Take 81 mg by mouth daily.    . cholecalciferol (VITAMIN D) 1000 UNITS tablet Take 1,000 Units by mouth daily.    . clopidogrel (PLAVIX) 75 MG tablet Take 1 tablet (75 mg total) by mouth daily. 90 tablet 1  . folic acid (FOLVITE) A999333 MCG tablet Take 400 mcg by mouth daily.      Marland Kitchen loratadine (CLARITIN) 10 MG tablet Take 10 mg by mouth daily as needed for allergies.    . montelukast (SINGULAIR) 10 MG tablet Take 1 tablet (10 mg total) by mouth at bedtime. 90 tablet 3  . Omega-3 Fatty Acids (FISH OIL) 1000 MG CAPS Take 1,000 mg by mouth daily.     . ranitidine (ZANTAC) 150 MG tablet TAKE 1 TABLET BY MOUTH TWICE DAILY 180 tablet 3  . sertraline (ZOLOFT) 50 MG tablet Take 1 tablet (50 mg total) by mouth daily. 90 tablet 3  . simvastatin (ZOCOR) 40 MG tablet Take 1 tablet (40 mg total) by mouth daily. 90 tablet 3  . tolterodine (DETROL) 2 MG tablet Take 1 tablet (2 mg total) by mouth at bedtime. 30 tablet 3  . umeclidinium-vilanterol (ANORO ELLIPTA) 62.5-25 MCG/INH AEPB Inhale 1 puff into the lungs  daily. 60 each 11  . vitamin E 400 UNIT capsule Take 400 Units by mouth daily.     No current facility-administered medications for this visit.      Past Medical History:  Diagnosis Date  . Anxiety   . Anxiety and depression   . Arthritis   . Benign neoplasm of esophagus   . CAD (coronary artery disease)   . Cancer [facility])    Skin cancer on back  . Carotid artery disease ([place])   . CHF (congestive heart failure) ([place])   . Chronic gastritis   . Colon, diverticulosis   . COPD (chronic obstructive pulmonary disease) ([place])   . Depression   . GERD (gastroesophageal reflux disease)   . Hiatal hernia   . High frequency hearing loss   . Hyperlipidemia   . IBS (irritable bowel syndrome)   . Idiopathic pulmonary fibrosis   . Internal hemorrhoid   . MI (myocardial infarction) ([place]) 1985  . Other nonthrombocytopenic purpuras   . PONV (postoperative nausea and vomiting)   . PVD (peripheral vascular disease) ([place])   . Shortness of breath    with exertion  . Solitary pulmonary nodule   . Stroke [facility])    Hx: of several mini -strokes  . Tinnitus    chronic  . Vertigo     Past Surgical History:  Procedure Laterality Date  .  ANGIOPLASTY  1997, 98, 99  . BREAST SURGERY     Hx; of biopsy  . CAROTID ENDARTERECTOMY     left side x 3, saphenous vein graft from left leg  . CATARACT EXTRACTION W/ INTRAOCULAR LENS  IMPLANT, BILATERAL    . COLON SURGERY    . COLONOSCOPY  11-27-01, 10-30-05, 05-24-11   diverticulosis, hemorrhoids  . DENTAL SURGERY     2012   . ENDARTERECTOMY Right 03/03/2014   Procedure: RIGHT CAROTID ENDARTERECTOMY WITH PATCH ANGIOPLASTY;  Surgeon: Rosetta Posner, MD;  Location: [place];  Service: Vascular;  Laterality: Right;  . FOOT SURGERY     bilateral  . SHOULDER SURGERY     left  . TOE SURGERY     right foot  . TONSILLECTOMY    . TUBAL LIGATION    . UPPER GASTROINTESTINAL ENDOSCOPY  11-27-01, 08-18-08   Hiatal hernia, benign esophagus neoplasia, gastritis, tortuous  esophagus 43 Maloney dilation performed    Social History   Social History  . Marital status: Widowed    Spouse name: N/A  . Number of children: 6  . Years of education: N/A   Occupational History  . 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 Smoking status: Former Smoker    Packs/day: 0.50    Years: 60.00    Types: Cigarettes    Quit date: 02/26/2014  . Smokeless tobacco: Never Used  . Alcohol use No  . Drug use: No  . Sexual activity: No   Other Topics Concern  . Not on file   Social History Narrative   [place] to [place] twice a year to visit sister- 3-4 weeks travel there every year.   Pt does not get regular exercise    Family History  Problem Relation Age of Onset  . Heart attack Father     deceased at 2, MGF  . Colon cancer Father   . Prostate cancer Father   . Heart disease Father   . Hyperlipidemia Father   . Heart disease Mother     deaceased at age 19  . Colitis Mother   . Stroke Mother   . Hyperlipidemia Mother   . Colitis Sister   . Hyperlipidemia Sister   . Other Brother     deceased age 50; war  . Hyperlipidemia Brother   . Breast cancer Maternal Aunt     great   . Lung cancer Paternal Aunt     great  . Vasculitis Son   . Hyperlipidemia Son   . Hypertension Son   . Heart attack Son   . Cancer Sister     unknown  . Hyperlipidemia Sister   . Hyperlipidemia Daughter   . Hypertension Daughter   . COPD Neg Hx   . Asthma Neg Hx     ROS: no fevers or chills, productive cough, hemoptysis, dysphasia, odynophagia, melena, hematochezia, dysuria, hematuria, rash, seizure activity, orthopnea, PND, pedal edema, claudication. Remaining systems are negative.  Physical Exam: Well-developed well-nourished in no acute distress.  Skin is warm and dry.  HEENT is normal.  Neck is supple.  Chest Basilar dry crackles Cardiovascular exam is regular rate and rhythm.  Abdominal  exam nontender or distended. No masses palpated. Extremities show no edema. neuro grossly intact  ECG-Sinus rhythm at a rate of 87. Nonspecific ST changes. Left ventricular hypertrophy.  A/P  1 hypertension-blood pressure controlled.  2 coronary artery disease-continue aspirin and statin.  3 carotid artery disease-followed by vascular surgery.  4 hyperlipidemia-continue statin. Lipids and liver monitored by primary care.  Kirk Ruths, MD

## 2016-07-12 ENCOUNTER — Ambulatory Visit: Payer: Self-pay

## 2016-07-12 ENCOUNTER — Other Ambulatory Visit: Payer: Self-pay | Admitting: Occupational Medicine

## 2016-07-12 DIAGNOSIS — Z0000 Encounter for general adult medical examination without abnormal findings: Secondary | ICD-10-CM

## 2016-07-13 ENCOUNTER — Encounter: Payer: Self-pay | Admitting: Cardiology

## 2016-07-16 ENCOUNTER — Encounter: Payer: Self-pay | Admitting: Cardiology

## 2016-07-16 ENCOUNTER — Ambulatory Visit (INDEPENDENT_AMBULATORY_CARE_PROVIDER_SITE_OTHER): Payer: PPO | Admitting: Cardiology

## 2016-07-16 VITALS — BP 140/76 | HR 87 | Ht 59.0 in | Wt 144.0 lb

## 2016-07-16 DIAGNOSIS — I10 Essential (primary) hypertension: Secondary | ICD-10-CM

## 2016-07-16 DIAGNOSIS — I2510 Atherosclerotic heart disease of native coronary artery without angina pectoris: Secondary | ICD-10-CM

## 2016-07-16 DIAGNOSIS — E785 Hyperlipidemia, unspecified: Secondary | ICD-10-CM | POA: Diagnosis not present

## 2016-07-16 NOTE — Patient Instructions (Signed)
Your physician wants you to follow-up in: ONE YEAR WITH DR CRENSHAW You will receive a reminder letter in the mail two months in advance. If you don't receive a letter, please call our office to schedule the follow-up appointment.   If you need a refill on your cardiac medications before your next appointment, please call your pharmacy.  

## 2016-07-17 ENCOUNTER — Ambulatory Visit: Payer: Self-pay | Admitting: Internal Medicine

## 2016-08-01 ENCOUNTER — Ambulatory Visit (INDEPENDENT_AMBULATORY_CARE_PROVIDER_SITE_OTHER): Payer: PPO | Admitting: Internal Medicine

## 2016-08-01 ENCOUNTER — Encounter: Payer: Self-pay | Admitting: Internal Medicine

## 2016-08-01 VITALS — BP 106/80 | HR 88 | Temp 97.5°F | Resp 16 | Wt 144.0 lb

## 2016-08-01 DIAGNOSIS — M5416 Radiculopathy, lumbar region: Secondary | ICD-10-CM

## 2016-08-01 DIAGNOSIS — Z23 Encounter for immunization: Secondary | ICD-10-CM

## 2016-08-01 MED ORDER — PREDNISONE 20 MG PO TABS: 40.0000 mg | ORAL_TABLET | Freq: Every day | ORAL | 0 refills | Status: AC

## 2016-08-01 NOTE — Progress Notes (Signed)
Subjective:    Patient ID: Linda Kelly, female    DOB: [birth year], 82 y.o.   MRN: [id number]  HPI She is here for an acute visit.   She started having left thigh pain for about one week.  The pain starts in her left lower back and radiates down the leg to the knee.  The pain is intermittent and a burning type pain.  The pain lasts for 10 minutes or 15 minutes when it comes.    She has tried applying a heating pad to the leg.  She has taken advil and she is unsure if it has helped.    She denies muscle spasms or numbness/tingling.  She has difficulty putting weight on left leg because it hurts.  The leg feels weak.  She denies prior episodes, but I did see her in May with similar symptoms.  At that time she had pain for one month, but was improving.  An x-ray showed diffuse degenerative changes lumbar spine and minimal, stable anterolisthesis L4-L5.  Medications and allergies reviewed with patient and updated if appropriate.  Patient Active Problem List   Diagnosis Date Noted  . Left-sided low back pain without sciatica 03/28/2016  . Cephalalgia 03/10/2016  . Overactive bladder 02/21/2016  . Poor balance 02/21/2016  . Chronic lower back pain 02/21/2016  . Carotid stenosis 03/03/2014  . Thoracic aorta atherosclerosis ([place]) 02/04/2014  . Change in bowel habits 05/22/2011  . URINARY INCONTINENCE 12/22/2010  . ABDOMINAL BRUIT 08/10/2010  . FASTING HYPERGLYCEMIA 11/15/2009  . Depression 08/01/2009  . GERD 08/01/2009  . IRRITABLE BOWEL SYNDROME 12/27/2008  . TINNITUS, CHRONIC 11/05/2008  . HEARING LOSS, HIGH FREQUENCY 11/05/2008  . HIATAL HERNIA 08/11/2008  . OTHER NONTHROMBOCYTOPENIC PURPURAS 06/08/2008  . ORTHOSTATIC HYPOTENSION 06/08/2008  . Idiopathic interstitial pneumonia/ UIP 11/20/2007  . Lung nodule 11/20/2007  . OBESITY, MILD 11/19/2007  . Hyperlipidemia 10/09/2007  . Tobacco abuse, in remission 10/09/2007  . Essential hypertension 10/09/2007  . Coronary  atherosclerosis 10/09/2007  . Peripheral vascular disease ([place]) 10/09/2007  . COPD mixed type ([place]) 10/09/2007  . VERTIGO 03/31/2007  . HEADACHE 03/31/2007    Current Outpatient Prescriptions on File Prior to Visit  Medication Sig Dispense Refill  . aspirin EC 81 MG tablet Take 81 mg by mouth daily.    . cholecalciferol (VITAMIN D) 1000 UNITS tablet Take 1,000 Units by mouth daily.    . clopidogrel (PLAVIX) 75 MG tablet Take 1 tablet (75 mg total) by mouth daily. 90 tablet 1  . folic acid (FOLVITE) A999333 MCG tablet Take 400 mcg by mouth daily.      Marland Kitchen loratadine (CLARITIN) 10 MG tablet Take 10 mg by mouth daily as needed for allergies.    . montelukast (SINGULAIR) 10 MG tablet Take 1 tablet (10 mg total) by mouth at bedtime. 90 tablet 3  . Omega-3 Fatty Acids (FISH OIL) 1000 MG CAPS Take 1,000 mg by mouth daily.     . ranitidine (ZANTAC) 150 MG tablet TAKE 1 TABLET BY MOUTH TWICE DAILY 180 tablet 3  . sertraline (ZOLOFT) 50 MG tablet Take 1 tablet (50 mg total) by mouth daily. 90 tablet 3  . simvastatin (ZOCOR) 40 MG tablet Take 1 tablet (40 mg total) by mouth daily. 90 tablet 3  . tolterodine (DETROL) 2 MG tablet Take 1 tablet (2 mg total) by mouth at bedtime. 30 tablet 3  . umeclidinium-vilanterol (ANORO ELLIPTA) 62.5-25 MCG/INH AEPB Inhale 1 puff into the lungs daily. 60 each  11  . vitamin E 400 UNIT capsule Take 400 Units by mouth daily.     No current facility-administered medications on file prior to visit.     Past Medical History:  Diagnosis Date  . Anxiety   . Anxiety and depression   . Arthritis   . Benign neoplasm of esophagus   . CAD (coronary artery disease)   . Cancer [facility])    Skin cancer on back  . Carotid artery disease ([place])   . CHF (congestive heart failure) ([place])   . Chronic gastritis   . Colon, diverticulosis   . COPD (chronic obstructive pulmonary disease) ([place])   . Depression   . GERD (gastroesophageal reflux disease)   . Hiatal hernia   . High frequency  hearing loss   . Hyperlipidemia   . IBS (irritable bowel syndrome)   . Idiopathic pulmonary fibrosis   . Internal hemorrhoid   . MI (myocardial infarction) 1985  . Other nonthrombocytopenic purpuras ([place])   . PONV (postoperative nausea and vomiting)   . PVD (peripheral vascular disease) ([place])   . Shortness of breath    with exertion  . Solitary pulmonary nodule   . Stroke [facility])    Hx: of several mini -strokes  . Tinnitus    chronic  . Vertigo     Past Surgical History:  Procedure Laterality Date  . ANGIOPLASTY  1997, 98, 99  . BREAST SURGERY     Hx; of biopsy  . CAROTID ENDARTERECTOMY     left side x 3, saphenous vein graft from left leg  . CATARACT EXTRACTION W/ INTRAOCULAR LENS  IMPLANT, BILATERAL    . COLON SURGERY    . COLONOSCOPY  11-27-01, 10-30-05, 05-24-11   diverticulosis, hemorrhoids  . DENTAL SURGERY     2012   . ENDARTERECTOMY Right 03/03/2014   Procedure: RIGHT CAROTID ENDARTERECTOMY WITH PATCH ANGIOPLASTY;  Surgeon: Rosetta Posner, MD;  Location: [place];  Service: Vascular;  Laterality: Right;  . FOOT SURGERY     bilateral  . SHOULDER SURGERY     left  . TOE SURGERY     right foot  . TONSILLECTOMY    . TUBAL LIGATION    . UPPER GASTROINTESTINAL ENDOSCOPY  11-27-01, 08-18-08   Hiatal hernia, benign esophagus neoplasia, gastritis, tortuous esophagus 69 Maloney dilation performed    Social History   Social History  . Marital status: Widowed    Spouse name: N/A  . Number of children: 6  . Years of education: N/A   Occupational History  . 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 Smoking status: Former Smoker    Packs/day: 0.50    Years: 60.00    Types: Cigarettes    Quit date: 02/26/2014  . Smokeless tobacco: Never Used  . Alcohol use No  . Drug use: No  . Sexual activity: No   Other Topics Concern  . Not on file   Social History Narrative   [place] to [place] twice a  year to visit sister- 3-4 weeks travel there every year.   Pt does not get regular exercise    Family History  Problem Relation Age of Onset  . Heart attack Father     deceased at 98, MGF  . Colon cancer Father   . Prostate cancer Father   . Heart disease Father   . Hyperlipidemia Father   .  Heart disease Mother     deaceased at age 50  . Colitis Mother   . Stroke Mother   . Hyperlipidemia Mother   . Colitis Sister   . Hyperlipidemia Sister   . Other Brother     deceased age 10; war  . Hyperlipidemia Brother   . Breast cancer Maternal Aunt     great   . Lung cancer Paternal Aunt     great  . Vasculitis Son   . Hyperlipidemia Son   . Hypertension Son   . Heart attack Son   . Cancer Sister     unknown  . Hyperlipidemia Sister   . Hyperlipidemia Daughter   . Hypertension Daughter   . COPD Neg Hx   . Asthma Neg Hx     Review of Systems  Constitutional: Negative for chills and fever.  Gastrointestinal:       No change in bowels habits  Genitourinary:       No change in urination  Musculoskeletal: Positive for arthralgias and back pain.  Neurological: Positive for weakness (left leg). Negative for numbness.       Objective:   Vitals:   08/01/16 0937  BP: 106/80  Pulse: 88  Resp: 16  Temp: 97.5 F (36.4 C)   Filed Weights   08/01/16 0937  Weight: 144 lb (65.3 kg)   Body mass index is 29.08 kg/m.   Physical Exam  Constitutional: She appears well-developed and well-nourished. No distress.  Musculoskeletal: She exhibits no edema.  No lumbar spine deformity or pain, mild posterior hip pain with palpation  Neurological:  Normal sensation in LE b/l, strength difficult to assess due to pain - no weakness on exam, slight limp with ambulation  Skin: She is not diaphoretic.       Assessment & Plan:   See Problem List for Assessment and Plan of chronic medical problems.

## 2016-08-01 NOTE — Progress Notes (Signed)
Pre visit review using our clinic review tool, if applicable. No additional management support is needed unless otherwise documented below in the visit note. 

## 2016-08-01 NOTE — Assessment & Plan Note (Signed)
Prednisone 40 mg daily with food for 5 days - hold advil while on prednisone Tylenol as needed Discussed starting gabapentin and reviewed side effects - will hold off for now, but can consider if no improvement with prednisone Discussed PT - she deferred Referred to ortho - she would like an injection She was instructed to call if no improvement, side effects from medication or questions/concerns

## 2016-08-01 NOTE — Patient Instructions (Signed)
We will start a 5 day course of prednisone to help with your nerve pain.  We discussed possible side effects.  You can take tylenol for your pain if needed.  We decided to hold off on gabapentin for now, but we can start this if your pain does not improve.   Your prescription(s) have been submitted to your pharmacy. Please take as directed and contact our office if you believe you are having problem(s) with the medication(s).  Try ice or heat. Avoid bending, lifting and twisting.    A referral was ordered for orthopedics - Dr Nelva Bush.  Consider physical therapy.   Please followup if needed   Lumbosacral Radiculopathy Lumbosacral radiculopathy is a condition that involves the spinal nerves and nerve roots in the low back and bottom of the spine. The condition develops when these nerves and nerve roots move out of place or become inflamed and cause symptoms. CAUSES This condition may be caused by:  Pressure from a disk that bulges out of place (herniated disk). A disk is a plate of cartilage that separates bones in the spine.  Disk degeneration.  A narrowing of the bones of the lower back (spinal stenosis).  A tumor.  An infection.  An injury that places sudden pressure on the disks that cushion the bones of your lower spine. RISK FACTORS This condition is more likely to develop in:  Males aged 30-50 years.  Females aged 29-60 years.  People who lift improperly.  People who are overweight or live a sedentary lifestyle.  People who smoke.  People who perform repetitive activities that strain the spine. SYMPTOMS Symptoms of this condition include:  Pain that goes down from the back into the legs (sciatica). This is the most common symptom. The pain may be worse with sitting, coughing, or sneezing.  Pain and numbness in the arms and legs.  Muscle weakness.  Tingling.  Loss of bladder control or bowel control. DIAGNOSIS This condition is diagnosed with a physical  exam and medical history. If the pain is lasting, you may have tests, such as:  MRI scan.  X-ray.  CT scan.  Myelogram.  Nerve conduction study. TREATMENT This condition is often treated with:  Hot packs and ice applied to affected areas.  Stretches to improve flexibility.  Exercises to strengthen back muscles.  Physical therapy.  Pain medicine.  A steroid injection in the spine. In some cases, no treatment is needed. If the condition is long-lasting (chronic), or if symptoms are severe, treatment may involve surgery or lifestyle changes, such as following a weight loss plan. HOME CARE INSTRUCTIONS Medicines  Take medicines only as directed by your health care provider.  Do not drive or operate heavy machinery while taking pain medicine. Injury Care  Apply a heat pack to the injured area as directed by your health care provider.  Apply ice to the affected area:  Put ice in a plastic bag.  Place a towel between your skin and the bag.  Leave the ice on for 20-30 minutes, every 2 hours while you are awake or as needed. Or, leave the ice on for as long as directed by your health care provider. Other Instructions  If you were shown how to do any exercises or stretches, do them as directed by your health care provider.  If your health care provider prescribed a diet or exercise program, follow it as directed.  Keep all follow-up visits as directed by your health care provider. This is important. SEEK  MEDICAL CARE IF:  Your pain does not improve over time even when taking pain medicines. SEEK IMMEDIATE MEDICAL CARE IF:  Your develop severe pain.  Your pain suddenly gets worse.  You develop increasing weakness in your legs.  You lose the ability to control your bladder or bowel.  You have difficulty walking or balancing.  You have a fever.   This information is not intended to replace advice given to you by your health care provider. Make sure you discuss  any questions you have with your health care provider.   Document Released: 10/15/2005 Document Revised: 03/01/2015 Document Reviewed: 10/11/2014 Elsevier Interactive Patient Education [employer].

## 2016-08-07 ENCOUNTER — Ambulatory Visit: Payer: Self-pay

## 2016-08-08 ENCOUNTER — Encounter (HOSPITAL_COMMUNITY): Payer: Self-pay | Admitting: Emergency Medicine

## 2016-08-08 ENCOUNTER — Ambulatory Visit (INDEPENDENT_AMBULATORY_CARE_PROVIDER_SITE_OTHER): Payer: PPO

## 2016-08-08 ENCOUNTER — Emergency Department (HOSPITAL_COMMUNITY): Payer: PPO

## 2016-08-08 ENCOUNTER — Inpatient Hospital Stay (HOSPITAL_COMMUNITY)
Admission: EM | Admit: 2016-08-08 | Discharge: 2016-08-14 | DRG: 280 | Disposition: A | Discharge status: Home / Self Care | Payer: PPO | Attending: Cardiology | Admitting: Cardiology

## 2016-08-08 VITALS — BP 108/68 | HR 98 | Temp 98.4°F | Resp 28 | Ht <= 58 in | Wt 148.5 lb

## 2016-08-08 DIAGNOSIS — Z9842 Cataract extraction status, left eye: Secondary | ICD-10-CM

## 2016-08-08 DIAGNOSIS — I509 Heart failure, unspecified: Secondary | ICD-10-CM

## 2016-08-08 DIAGNOSIS — Z8249 Family history of ischemic heart disease and other diseases of the circulatory system: Secondary | ICD-10-CM | POA: Diagnosis not present

## 2016-08-08 DIAGNOSIS — I214 Non-ST elevation (NSTEMI) myocardial infarction: Secondary | ICD-10-CM | POA: Diagnosis present

## 2016-08-08 DIAGNOSIS — R0602 Shortness of breath: Secondary | ICD-10-CM | POA: Diagnosis present

## 2016-08-08 DIAGNOSIS — J449 Chronic obstructive pulmonary disease, unspecified: Secondary | ICD-10-CM | POA: Diagnosis present

## 2016-08-08 DIAGNOSIS — Z9841 Cataract extraction status, right eye: Secondary | ICD-10-CM

## 2016-08-08 DIAGNOSIS — Z85828 Personal history of other malignant neoplasm of skin: Secondary | ICD-10-CM | POA: Diagnosis not present

## 2016-08-08 DIAGNOSIS — E785 Hyperlipidemia, unspecified: Secondary | ICD-10-CM | POA: Diagnosis present

## 2016-08-08 DIAGNOSIS — I252 Old myocardial infarction: Secondary | ICD-10-CM | POA: Diagnosis not present

## 2016-08-08 DIAGNOSIS — Z79899 Other long term (current) drug therapy: Secondary | ICD-10-CM

## 2016-08-08 DIAGNOSIS — Z87891 Personal history of nicotine dependence: Secondary | ICD-10-CM

## 2016-08-08 DIAGNOSIS — Z7901 Long term (current) use of anticoagulants: Secondary | ICD-10-CM

## 2016-08-08 DIAGNOSIS — I6529 Occlusion and stenosis of unspecified carotid artery: Secondary | ICD-10-CM | POA: Diagnosis not present

## 2016-08-08 DIAGNOSIS — I110 Hypertensive heart disease with heart failure: Secondary | ICD-10-CM | POA: Diagnosis present

## 2016-08-08 DIAGNOSIS — Z7982 Long term (current) use of aspirin: Secondary | ICD-10-CM

## 2016-08-08 DIAGNOSIS — I2510 Atherosclerotic heart disease of native coronary artery without angina pectoris: Secondary | ICD-10-CM

## 2016-08-08 DIAGNOSIS — Z9861 Coronary angioplasty status: Secondary | ICD-10-CM

## 2016-08-08 DIAGNOSIS — I10 Essential (primary) hypertension: Secondary | ICD-10-CM | POA: Diagnosis not present

## 2016-08-08 DIAGNOSIS — Z8673 Personal history of transient ischemic attack (TIA), and cerebral infarction without residual deficits: Secondary | ICD-10-CM

## 2016-08-08 DIAGNOSIS — J84112 Idiopathic pulmonary fibrosis: Secondary | ICD-10-CM | POA: Diagnosis present

## 2016-08-08 DIAGNOSIS — Z961 Presence of intraocular lens: Secondary | ICD-10-CM | POA: Diagnosis present

## 2016-08-08 DIAGNOSIS — I5021 Acute systolic (congestive) heart failure: Secondary | ICD-10-CM | POA: Diagnosis not present

## 2016-08-08 DIAGNOSIS — I5041 Acute combined systolic (congestive) and diastolic (congestive) heart failure: Secondary | ICD-10-CM | POA: Diagnosis present

## 2016-08-08 DIAGNOSIS — I5042 Chronic combined systolic (congestive) and diastolic (congestive) heart failure: Secondary | ICD-10-CM

## 2016-08-08 DIAGNOSIS — I213 ST elevation (STEMI) myocardial infarction of unspecified site: Secondary | ICD-10-CM | POA: Diagnosis not present

## 2016-08-08 DIAGNOSIS — J8410 Pulmonary fibrosis, unspecified: Secondary | ICD-10-CM

## 2016-08-08 LAB — COMPREHENSIVE METABOLIC PANEL
ALK PHOS: 58 U/L (ref 38–126)
ALT: 13 U/L — AB (ref 14–54)
ANION GAP: 8 (ref 5–15)
AST: 40 U/L (ref 15–41)
Albumin: 3.6 g/dL (ref 3.5–5.0)
BILIRUBIN TOTAL: 1.1 mg/dL (ref 0.3–1.2)
BUN: 26 mg/dL — ABNORMAL HIGH (ref 6–20)
CALCIUM: 8.1 mg/dL — AB (ref 8.9–10.3)
CO2: 24 mmol/L (ref 22–32)
CREATININE: 0.92 mg/dL (ref 0.44–1.00)
Chloride: 103 mmol/L (ref 101–111)
GFR, EST NON AFRICAN AMERICAN: 55 mL/min — AB (ref >60)
Glucose, Bld: 102 mg/dL — ABNORMAL HIGH (ref 65–99)
Potassium: 3.7 mmol/L (ref 3.5–5.1)
Sodium: 135 mmol/L (ref 135–145)
TOTAL PROTEIN: 6.7 g/dL (ref 6.5–8.1)

## 2016-08-08 LAB — CBC WITH DIFFERENTIAL/PLATELET
BASOS ABS: 0 10*3/uL (ref 0.0–0.1)
BASOS PCT: 0 %
EOS ABS: 0.2 10*3/uL (ref 0.0–0.7)
Eosinophils Relative: 1 %
HEMATOCRIT: 33.9 % — AB (ref 36.0–46.0)
HEMOGLOBIN: 11.2 g/dL — AB (ref 12.0–15.0)
Lymphocytes Relative: 17 %
Lymphs Abs: 2.6 10*3/uL (ref 0.7–4.0)
MCH: 31.2 pg (ref 26.0–34.0)
MCHC: 33 g/dL (ref 30.0–36.0)
MCV: 94.4 fL (ref 78.0–100.0)
Monocytes Absolute: 1.6 10*3/uL — ABNORMAL HIGH (ref 0.1–1.0)
Monocytes Relative: 10 %
NEUTROS ABS: 10.9 10*3/uL — AB (ref 1.7–7.7)
NEUTROS PCT: 72 %
PLATELETS: 219 10*3/uL (ref 150–400)
RBC: 3.59 MIL/uL — ABNORMAL LOW (ref 3.87–5.11)
RDW: 15.9 % — AB (ref 11.5–15.5)
WBC: 15.3 10*3/uL — ABNORMAL HIGH (ref 4.0–10.5)

## 2016-08-08 LAB — TROPONIN I: Troponin I: 11.9 ng/mL (ref <0.03)

## 2016-08-08 LAB — MRSA PCR SCREENING: MRSA BY PCR: NEGATIVE

## 2016-08-08 LAB — GLUCOSE, CAPILLARY: GLUCOSE-CAPILLARY: 111 mg/dL — AB (ref 65–99)

## 2016-08-08 LAB — BRAIN NATRIURETIC PEPTIDE: B Natriuretic Peptide: 1224.6 pg/mL — ABNORMAL HIGH (ref 0.0–100.0)

## 2016-08-08 MED ORDER — HEPARIN BOLUS VIA INFUSION
3000.0000 [IU] | Freq: Once | INTRAVENOUS | Status: AC
  Administered 2016-08-08: 3000 [IU] via INTRAVENOUS
  Filled 2016-08-08: qty 3000

## 2016-08-08 MED ORDER — HEPARIN (PORCINE) IN NACL 100-0.45 UNIT/ML-% IJ SOLN
1200.0000 [IU]/h | INTRAMUSCULAR | Status: DC
  Administered 2016-08-08: 650 [IU]/h via INTRAVENOUS
  Administered 2016-08-09: 1000 [IU]/h via INTRAVENOUS
  Filled 2016-08-08 (×2): qty 250

## 2016-08-08 MED ORDER — FUROSEMIDE 10 MG/ML IJ SOLN
40.0000 mg | Freq: Once | INTRAMUSCULAR | Status: AC
  Administered 2016-08-08: 40 mg via INTRAVENOUS
  Filled 2016-08-08: qty 4

## 2016-08-08 NOTE — H&P (Signed)
Linda Kelly is an 81 y.o. female.   Primary Cardiologist: Dr. Stanford Breed PMD: Chief Complaint: Shortness of breath HPI: 82 year old with a history of MI 37 years ago. She had been feeling well until yesterday. She noticed that she was very short of breath and fatigue. She went to sleep and said she had multiple dreams about being short of breath. During the day today, she continued to be very fatigued. She stayed in bed. Finally, her daughter came to see her and insisted that she get medical care.    Currently, she feels much better. She denies any chest discomfort. Her breathing is much better after receiving Lasix and urinating several times. Overall, she is comfortable at this time.  Lab work revealed elevated troponin. She is now on IV heparin.  Past Medical History:  Diagnosis Date  . Anxiety   . Anxiety and depression   . Arthritis   . Benign neoplasm of esophagus   . CAD (coronary artery disease)   . Cancer [facility])    Skin cancer on back  . Carotid artery disease ([place])   . CHF (congestive heart failure) ([place])   . Chronic gastritis   . Colon, diverticulosis   . COPD (chronic obstructive pulmonary disease) ([place])   . Depression   . GERD (gastroesophageal reflux disease)   . Hiatal hernia   . High frequency hearing loss   . Hyperlipidemia   . IBS (irritable bowel syndrome)   . Idiopathic pulmonary fibrosis   . Internal hemorrhoid   . MI (myocardial infarction) 1985  . Other nonthrombocytopenic purpuras   . PONV (postoperative nausea and vomiting)   . PVD (peripheral vascular disease) ([place])   . Shortness of breath    with exertion  . Solitary pulmonary nodule   . Stroke [facility])    Hx: of several mini -strokes  . Tinnitus    chronic  . Vertigo     Past Surgical History:  Procedure Laterality Date  . ANGIOPLASTY  1997, 98, 99  . BREAST SURGERY     Hx; of biopsy  . CAROTID ENDARTERECTOMY     left side x 3, saphenous vein graft from left leg  . CATARACT EXTRACTION W/  INTRAOCULAR LENS  IMPLANT, BILATERAL    . COLON SURGERY    . COLONOSCOPY  11-27-01, 10-30-05, 05-24-11   diverticulosis, hemorrhoids  . DENTAL SURGERY     2012   . ENDARTERECTOMY Right 03/03/2014   Procedure: RIGHT CAROTID ENDARTERECTOMY WITH PATCH ANGIOPLASTY;  Surgeon: Rosetta Posner, MD;  Location: [place];  Service: Vascular;  Laterality: Right;  . FOOT SURGERY     bilateral  . SHOULDER SURGERY     left  . TOE SURGERY     right foot  . TONSILLECTOMY    . TUBAL LIGATION    . UPPER GASTROINTESTINAL ENDOSCOPY  11-27-01, 08-18-08   Hiatal hernia, benign esophagus neoplasia, gastritis, tortuous esophagus 55 Maloney dilation performed    Family History  Problem Relation Age of Onset  . Heart attack Father     deceased at 73, MGF  . Colon cancer Father   . Prostate cancer Father   . Heart disease Father   . Hyperlipidemia Father   . Heart disease Mother     deaceased at age 76  . Colitis Mother   . Stroke Mother   . Hyperlipidemia Mother   . Colitis Sister   . Hyperlipidemia Sister   . Other Brother     deceased age 73;  war  . Hyperlipidemia Brother   . Breast cancer Maternal Aunt     great   . Lung cancer Paternal Aunt     great  . Vasculitis Son   . Hyperlipidemia Son   . Hypertension Son   . Heart attack Son   . Cancer Sister     unknown  . Hyperlipidemia Sister   . Hyperlipidemia Daughter   . Hypertension Daughter   . COPD Neg Hx   . Asthma Neg Hx    Social History:  reports that she quit smoking about 2 years ago. Her smoking use included Cigarettes. She has a 30.00 pack-year smoking history. She has never used smokeless tobacco. She reports that she does not drink alcohol or use drugs.  Allergies: No Known Allergies  Medications Prior to Admission  Medication Sig Dispense Refill  . aspirin EC 81 MG tablet Take 81 mg by mouth at bedtime.     . cholecalciferol (VITAMIN D) 1000 UNITS tablet Take 1,000 Units by mouth at bedtime.     . clopidogrel (PLAVIX) 75 MG  tablet Take 1 tablet (75 mg total) by mouth daily. (Patient taking differently: Take 75 mg by mouth at bedtime. ) 90 tablet 1  . folic acid (FOLVITE) 546 MCG tablet Take 400 mcg by mouth at bedtime.     Marland Kitchen loratadine (CLARITIN) 10 MG tablet Take 10 mg by mouth daily as needed for allergies.    . montelukast (SINGULAIR) 10 MG tablet Take 1 tablet (10 mg total) by mouth at bedtime. 90 tablet 3  . Omega-3 Fatty Acids (FISH OIL) 1000 MG CAPS Take 1,000 mg by mouth at bedtime.     . ranitidine (ZANTAC) 150 MG tablet TAKE 1 TABLET BY MOUTH TWICE DAILY (Patient taking differently: Take 150 mg by mouth 2 (two) times daily. ) 180 tablet 3  . sertraline (ZOLOFT) 50 MG tablet Take 1 tablet (50 mg total) by mouth daily. (Patient taking differently: Take 50 mg by mouth at bedtime. ) 90 tablet 3  . simvastatin (ZOCOR) 40 MG tablet Take 1 tablet (40 mg total) by mouth daily. (Patient taking differently: Take 40 mg by mouth at bedtime. ) 90 tablet 3  . tolterodine (DETROL) 2 MG tablet Take 1 tablet (2 mg total) by mouth at bedtime. 30 tablet 3  . umeclidinium-vilanterol (ANORO ELLIPTA) 62.5-25 MCG/INH AEPB Inhale 1 puff into the lungs daily. 60 each 11  . vitamin E 400 UNIT capsule Take 400 Units by mouth at bedtime.       Results for orders placed or performed during the hospital encounter of 08/08/16 (from the past 48 hour(s))  Comprehensive metabolic panel     Status: Abnormal   Collection Time: 08/08/16  5:42 PM  Result Value Ref Range   Sodium 135 135 - 145 mmol/L   Potassium 3.7 3.5 - 5.1 mmol/L   Chloride 103 101 - 111 mmol/L   CO2 24 22 - 32 mmol/L   Glucose, Bld 102 (H) 65 - 99 mg/dL   BUN 26 (H) 6 - 20 mg/dL   Creatinine, Ser 0.92 0.44 - 1.00 mg/dL   Calcium 8.1 (L) 8.9 - 10.3 mg/dL   Total Protein 6.7 6.5 - 8.1 g/dL   Albumin 3.6 3.5 - 5.0 g/dL   AST 40 15 - 41 U/L   ALT 13 (L) 14 - 54 U/L   Alkaline Phosphatase 58 38 - 126 U/L   Total Bilirubin 1.1 0.3 - 1.2 mg/dL   GFR calc  non Af Amer 55  (L) >60 mL/min   GFR calc Af Amer >60 >60 mL/min    Comment: (NOTE) The eGFR has been calculated using the CKD EPI equation. This calculation has not been validated in all clinical situations. eGFR's persistently <60 mL/min signify possible Chronic Kidney Disease.    Anion gap 8 5 - 15  CBC WITH DIFFERENTIAL     Status: Abnormal   Collection Time: 08/08/16  5:42 PM  Result Value Ref Range   WBC 15.3 (H) 4.0 - 10.5 K/uL   RBC 3.59 (L) 3.87 - 5.11 MIL/uL   Hemoglobin 11.2 (L) 12.0 - 15.0 g/dL   HCT 33.9 (L) 36.0 - 46.0 %   MCV 94.4 78.0 - 100.0 fL   MCH 31.2 26.0 - 34.0 pg   MCHC 33.0 30.0 - 36.0 g/dL   RDW 15.9 (H) 11.5 - 15.5 %   Platelets 219 150 - 400 K/uL   Neutrophils Relative % 72 %   Neutro Abs 10.9 (H) 1.7 - 7.7 K/uL   Lymphocytes Relative 17 %   Lymphs Abs 2.6 0.7 - 4.0 K/uL   Monocytes Relative 10 %   Monocytes Absolute 1.6 (H) 0.1 - 1.0 K/uL   Eosinophils Relative 1 %   Eosinophils Absolute 0.2 0.0 - 0.7 K/uL   Basophils Relative 0 %   Basophils Absolute 0.0 0.0 - 0.1 K/uL  Troponin I (MHP)     Status: Abnormal   Collection Time: 08/08/16  5:42 PM  Result Value Ref Range   Troponin I 11.90 (HH) <0.03 ng/mL    Comment: REPEATED TO VERIFY CRITICAL RESULT CALLED TO, READ BACK BY AND VERIFIED WITH: TALKINGTON,J AT 1844 ON 10.11.17 BY MOSLEY,J   Brain natriuretic peptide     Status: Abnormal   Collection Time: 08/08/16  5:42 PM  Result Value Ref Range   B Natriuretic Peptide 1,224.6 (H) 0.0 - 100.0 pg/mL  MRSA PCR Screening     Status: None   Collection Time: 08/08/16  9:00 PM  Result Value Ref Range   MRSA by PCR NEGATIVE NEGATIVE    Comment:        The GeneXpert MRSA Assay (FDA approved for NASAL specimens only), is one component of a comprehensive MRSA colonization surveillance program. It is not intended to diagnose MRSA infection nor to guide or monitor treatment for MRSA infections.   Glucose, capillary     Status: Abnormal   Collection Time:  08/08/16  9:02 PM  Result Value Ref Range   Glucose-Capillary 111 (H) 65 - 99 mg/dL   Dg Chest 2 View  Result Date: 08/08/2016 CLINICAL DATA:  Shortness of breath with exertion EXAM: CHEST  2 VIEW COMPARISON:  07/12/2016 FINDINGS: Cardiac shadow is enlarged but stable. Diffuse increased interstitial changes are noted likely representing some interstitial edema superimposed over more chronic fibrotic changes. No sizable effusion is noted. No focal confluent infiltrate is seen. IMPRESSION: Likely degree of interstitial edema superimposed over more chronic fibrotic changes. Electronically Signed   By: Inez Catalina M.D.   On: 08/08/2016 17:30    ROS: Shortness of breath as noted above.  All other systems negative  OBJECTIVE:   Vitals:   Vitals:   08/08/16 2025 08/08/16 2100 08/08/16 2109 08/08/16 2314  BP: 118/70  (!) 130/104 (!) 133/115  Pulse: 80 92 86 65  Resp: 20 (!) 29 16 (!) 21  Temp: 97.8 F (36.6 C) 97.6 F (36.4 C)    TempSrc: Oral Oral  SpO2: 95% 95% 96% 92%  Weight:  145 lb 8 oz (66 kg)    Height:  _0  (1.473 m)     I&O's:   Intake/Output Summary (Last 24 hours) at 08/08/16 2327 Last data filed at 08/08/16 2300  Gross per 24 hour  Intake              360 ml  Output             1375 ml  Net            -1015 ml   TELEMETRY: Reviewed telemetry pt in Normal sinus rhythm:     PHYSICAL EXAM General: Well developed, well nourished, in no acute distress Head:   Normal cephalic and atramatic  Lungs:  No wheezing bilaterally to auscultation. Heart:   HRRR S1 S2  No JVD.   Abdomen: abdomen soft and non-tender Msk:  Back normal,  Normal strength and tone for age. Extremities:   No edema.   Neuro: Alert and oriented. Psych:  Normal affect, responds appropriately Skin: No rash  LABS: Basic Metabolic Panel:  Recent Labs  08/08/16 1742  NA 135  K 3.7  CL 103  CO2 24  GLUCOSE 102*  BUN 26*  CREATININE 0.92  CALCIUM 8.1*   Liver Function Tests:  Recent  Labs  08/08/16 1742  AST 40  ALT 13*  ALKPHOS 58  BILITOT 1.1  PROT 6.7  ALBUMIN 3.6   No results for input(s): LIPASE, AMYLASE in the last 72 hours. CBC:  Recent Labs  08/08/16 1742  WBC 15.3*  NEUTROABS 10.9*  HGB 11.2*  HCT 33.9*  MCV 94.4  PLT 219   Cardiac Enzymes:  Recent Labs  08/08/16 1742  TROPONINI 11.90*   BNP: Invalid input(s): POCBNP D-Dimer: No results for input(s): DDIMER in the last 72 hours. Hemoglobin A1C: No results for input(s): HGBA1C in the last 72 hours. Fasting Lipid Panel: No results for input(s): CHOL, HDL, LDLCALC, TRIG, CHOLHDL, LDLDIRECT in the last 72 hours. Thyroid Function Tests: No results for input(s): TSH, T4TOTAL, T3FREE, THYROIDAB in the last 72 hours.  Invalid input(s): FREET3 Anemia Panel: No results for input(s): VITAMINB12, FOLATE, FERRITIN, TIBC, IRON, RETICCTPCT in the last 72 hours. Coag Panel:   Lab Results  Component Value Date   INR 1.06 02/26/2014    Chest x-ray reveals mild interstitial edema   Assessment/Plan Non-STEMI: Continue IV heparin. Beta blocker started. Continue statin. The patient has been made nothing by mouth past midnight. Consider cardiac cath in a.m. She has a long history of vascular disease dating back to 62. She has had a carotid endarterectomy. I think there will be a high likelihood of three-vessel coronary artery disease.  Hyperlipidemia: Continue simvastatin.  Ejection fraction 60% in 2010.  She'll need an echocardiogram as well. She is better after 1 dose of Lasix. Will not order any more Lasix at this time. Check renal function in the morning.   Larae Grooms 08/08/2016, 11:27 PM

## 2016-08-08 NOTE — ED Notes (Signed)
No respiratory or acute distress noted alert and oriented x 3 call light in reach able to speak in full sentences no reaction to medication noted.

## 2016-08-08 NOTE — ED Notes (Signed)
No respiratory or acute distress noted alert and oriented x 3 able to speak in full sentences no reaction to medication noted left with [facility] to [facility].

## 2016-08-08 NOTE — Progress Notes (Signed)
ANTICOAGULATION CONSULT NOTE - Initial Consult  Pharmacy Consult for Heparin Indication: chest pain/ACS  No Known Allergies  Patient Measurements:   Heparin Dosing Weight: 56kg  Vital Signs: Temp: 98.2 F (36.8 C) (10/11 1647) Temp Source: Oral (10/11 1647) BP: 111/76 (10/11 1854) Pulse Rate: 78 (10/11 1854)  Labs:  Recent Labs  08/08/16 1742  HGB 11.2*  HCT 33.9*  PLT 219  CREATININE 0.92  TROPONINI 11.90*    Estimated Creatinine Clearance: 36.3 mL/min (by C-G formula based on SCr of 0.92 mg/dL).   Medical History: Past Medical History:  Diagnosis Date  . Anxiety   . Anxiety and depression   . Arthritis   . Benign neoplasm of esophagus   . CAD (coronary artery disease)   . Cancer [facility])    Skin cancer on back  . Carotid artery disease ([place])   . CHF (congestive heart failure) ([place])   . Chronic gastritis   . Colon, diverticulosis   . COPD (chronic obstructive pulmonary disease) ([place])   . Depression   . GERD (gastroesophageal reflux disease)   . Hiatal hernia   . High frequency hearing loss   . Hyperlipidemia   . IBS (irritable bowel syndrome)   . Idiopathic pulmonary fibrosis   . Internal hemorrhoid   . MI (myocardial infarction) 1985  . Other nonthrombocytopenic purpuras   . PONV (postoperative nausea and vomiting)   . PVD (peripheral vascular disease) ([place])   . Shortness of breath    with exertion  . Solitary pulmonary nodule   . Stroke [facility])    Hx: of several mini -strokes  . Tinnitus    chronic  . Vertigo     Medications:  Scheduled:  . heparin  3,000 Units Intravenous Once   Infusions:  . heparin     PRN:   Assessment: 82 yo female who presents with near syncope yesterday, fatigue and lightheadedness earlier today.  Pharmacy is consulted to dose IV heparin for NSTEMI.  Patient takes daily plavix and aspirin 81mg .  Baseline PT/INR/PTT pending.  Goal of Therapy:  Heparin level 0.3-0.7 units/ml Monitor platelets by anticoagulation  protocol: Yes   Plan:   Heparin 3000 units (~60 units/kg)  IV bolus x 1  Heparin 650 units/hr (~12 units/kg/hr) IV infusion  Check heparin level 8hrs after starting  Daily heparin level and CBC  Peggyann Juba, PharmD, BCPS Pager: [phone] 08/08/2016,7:42 PM

## 2016-08-08 NOTE — Progress Notes (Addendum)
Subjective:   Linda Kelly is a 82 y.o. female who presents for Medicare Annual (Subsequent) preventive examination.  Here today with dtr for AWV.   Stated c/o of  dizziness since last pm. Stated she got oob this am and "fell back in the bed"  Had not felt good. Had a dream that she could not breath. Has not been up today. Denies coughing or sputum Temp 98; no prior illness; taking anoro; had not had rescue inhaler today States abd is distended; no BM; not sure when Dtr helped her to get oob; has had not food; cup of coffee  No disorientation; pulse regular. Oriented x 3  Lungs / wheezes but states this is normal;  Denies chest pain;  Pulse ox up to 83 when resting; Oxygen started at 2 liters  Patient states she has not been on oxygen at home Respirations at 32 post walk without O2; pulse ox remains 68 without oxygen;   Oxygen sats 93 with 2 liters of Oxygen  The patient was told her pulse oxygen was to low; needs to be evaluated. Discussed with Dr. Quay Burow;  The patient verbalizes and agrees to transfer by ambulance to [facility] accompanied her.   AWV deferred; Ignore note below   [place] Maintenance Due  Topic Date Due  . Samul Dada  10/18/1947  . DEXA SCAN  10/19/1995   No tetanus listed No Dexa listed   Mammogram 04/2017 Colonoscopy- aged out EKG 06/2016  Other   Vaccination update:   Medications reviewed for issues;     Depression; anxiety or mood issues assessed  Do you have little interest or pleasure in doing things? Have you been feeling down, depressed, hopeless?  PHQ9 waived or completed    Cognitive screen completed; MMSE documented or assessed for failures or issues with the AD8 screen below:   Ad8 score reviewed for issues;  Issues making decisions; no  Less interest in hobbies / activities" no  Repeats questions, stories; family complaining: NO  Trouble using ordinary gadgets; microwave;  computer: no  Forgets the month or year: no  Mismanaging finances: no  Missing apt: no but does write them down  Daily problems with thinking of memory NO Ad8 score is 0  MMSE not appropriate unless AD8 score is > 2   Advanced Directive reviewed for completion or educated regarding [facility] [employer] form; the electing a health care agent and completing the Living Will.    Established and updated Risk reviewed and appropriate referral made or health recommendations as appropriate based on individual needs and choices;   Current Care Team reviewed and updated Dr. Stanford Breed; cardiology Dr. Annamaria Boots Pulmonology Dr. Donnetta Hutching; Vascular; s/p endarterectomy       Objective:     Vitals: There were no vitals taken for this visit.  There is no height or weight on file to calculate BMI.   Tobacco History  Smoking Status  . Former Smoker  . Packs/day: 0.50  . Years: 60.00  . Types: Cigarettes  . Quit date: 02/26/2014  Smokeless Tobacco  . Never Used     Counseling given: Not Answered   Past Medical History:  Diagnosis Date  . Anxiety   . Anxiety and depression   . Arthritis   . Benign neoplasm of esophagus   . CAD (coronary artery disease)   . Cancer [facility])    Skin cancer on back  .  Carotid artery disease ([place])   . CHF (congestive heart failure) ([place])   . Chronic gastritis   . Colon, diverticulosis   . COPD (chronic obstructive pulmonary disease) ([place])   . Depression   . GERD (gastroesophageal reflux disease)   . Hiatal hernia   . High frequency hearing loss   . Hyperlipidemia   . IBS (irritable bowel syndrome)   . Idiopathic pulmonary fibrosis   . Internal hemorrhoid   . MI (myocardial infarction) 1985  . Other nonthrombocytopenic purpuras   . PONV (postoperative nausea and vomiting)   . PVD (peripheral vascular disease) ([place])   . Shortness of breath    with exertion  . Solitary pulmonary nodule   . Stroke [facility])    Hx: of several mini -strokes  . Tinnitus    chronic  .  Vertigo    Past Surgical History:  Procedure Laterality Date  . ANGIOPLASTY  1997, 98, 99  . BREAST SURGERY     Hx; of biopsy  . CAROTID ENDARTERECTOMY     left side x 3, saphenous vein graft from left leg  . CATARACT EXTRACTION W/ INTRAOCULAR LENS  IMPLANT, BILATERAL    . COLON SURGERY    . COLONOSCOPY  11-27-01, 10-30-05, 05-24-11   diverticulosis, hemorrhoids  . DENTAL SURGERY     2012   . ENDARTERECTOMY Right 03/03/2014   Procedure: RIGHT CAROTID ENDARTERECTOMY WITH PATCH ANGIOPLASTY;  Surgeon: Rosetta Posner, MD;  Location: [place];  Service: Vascular;  Laterality: Right;  . FOOT SURGERY     bilateral  . SHOULDER SURGERY     left  . TOE SURGERY     right foot  . TONSILLECTOMY    . TUBAL LIGATION    . UPPER GASTROINTESTINAL ENDOSCOPY  11-27-01, 08-18-08   Hiatal hernia, benign esophagus neoplasia, gastritis, tortuous esophagus 25 Maloney dilation performed   Family History  Problem Relation Age of Onset  . Heart attack Father     deceased at 27, MGF  . Colon cancer Father   . Prostate cancer Father   . Heart disease Father   . Hyperlipidemia Father   . Heart disease Mother     deaceased at age 25  . Colitis Mother   . Stroke Mother   . Hyperlipidemia Mother   . Colitis Sister   . Hyperlipidemia Sister   . Other Brother     deceased age 15; war  . Hyperlipidemia Brother   . Breast cancer Maternal Aunt     great   . Lung cancer Paternal Aunt     great  . Vasculitis Son   . Hyperlipidemia Son   . Hypertension Son   . Heart attack Son   . Cancer Sister     unknown  . Hyperlipidemia Sister   . Hyperlipidemia Daughter   . Hypertension Daughter   . COPD Neg Hx   . Asthma Neg Hx    History  Sexual Activity  . Sexual activity: No    Outpatient Encounter Prescriptions as of 08/08/2016  Medication Sig  . aspirin EC 81 MG tablet Take 81 mg by mouth daily.  . cholecalciferol (VITAMIN D) 1000 UNITS tablet Take 1,000 Units by mouth daily.  . clopidogrel (PLAVIX) 75 MG  tablet Take 1 tablet (75 mg total) by mouth daily.  . folic acid (FOLVITE) A999333 MCG tablet Take 400 mcg by mouth daily.    Marland Kitchen loratadine (CLARITIN) 10 MG tablet Take 10 mg by mouth daily as needed for  allergies.  . montelukast (SINGULAIR) 10 MG tablet Take 1 tablet (10 mg total) by mouth at bedtime.  . Omega-3 Fatty Acids (FISH OIL) 1000 MG CAPS Take 1,000 mg by mouth daily.   . ranitidine (ZANTAC) 150 MG tablet TAKE 1 TABLET BY MOUTH TWICE DAILY  . sertraline (ZOLOFT) 50 MG tablet Take 1 tablet (50 mg total) by mouth daily.  . simvastatin (ZOCOR) 40 MG tablet Take 1 tablet (40 mg total) by mouth daily.  Marland Kitchen tolterodine (DETROL) 2 MG tablet Take 1 tablet (2 mg total) by mouth at bedtime.  Marland Kitchen umeclidinium-vilanterol (ANORO ELLIPTA) 62.5-25 MCG/INH AEPB Inhale 1 puff into the lungs daily.  . vitamin E 400 UNIT capsule Take 400 Units by mouth daily.   No facility-administered encounter medications on file as of 08/08/2016.     Activities of Daily Living No flowsheet data found.  Patient Care Team: Binnie Rail, MD as PCP - General (Internal Medicine) Rosetta Posner, MD as Consulting Physician (Vascular Surgery)    Assessment:     Exercise Activities and Dietary recommendations    Goals    None     Fall Risk Fall Risk  03/27/2016 02/21/2016 12/28/2013  Falls in the past year? Yes Yes No  Number falls in past yr: 1 1 -  Injury with Fall? Yes No -  Risk Factor Category  High Fall Risk - -  Risk for fall due to : History of fall(s) Impaired balance/gait -  Follow up - Falls evaluation completed -   Depression Screen PHQ 2/9 Scores 03/27/2016 02/21/2016 12/28/2013  PHQ - 2 Score 0 1 0     Cognitive Testing No flowsheet data found.  Immunization History  Administered Date(s) Administered  . Influenza Split 07/30/2011, 07/29/2012, 08/05/2013  . Influenza Whole 10/02/2007, 08/04/2009, 07/29/2010  . Influenza, High Dose Seasonal PF 08/01/2016  . Influenza,inj,Quad PF,36+ Mos 06/29/2014,  07/14/2015  . Pneumococcal Conjugate-13 07/14/2015  . Pneumococcal Polysaccharide-23 10/29/2002, 08/06/2013  . Zoster 06/29/2013   Screening Tests Health Maintenance  Topic Date Due  . TETANUS/TDAP  10/18/1947  . DEXA SCAN  10/19/1995  . INFLUENZA VACCINE  Completed  . ZOSTAVAX  Completed  . PNA vac Low Risk Adult  Completed      Plan:   During the course of the visit the patient was educated and counseled about the following appropriate screening and preventive services:   Vaccines to include Pneumoccal, Influenza, Hepatitis B, Td, Zostavax, HCV  Electrocardiogram  Cardiovascular Disease  Colorectal cancer screening  Bone density screening  Diabetes screening  Glaucoma screening  Mammography/PAP  Nutrition counseling   Patient Instructions (the written plan) was given to the patient.   [id number], RN  08/08/2016   Medical screening examination/treatment/procedure(s) were performed by non-physician practitioner and as supervising physician I was immediately available for consultation/collaboration. I agree with above. Binnie Rail, MD

## 2016-08-08 NOTE — ED Triage Notes (Signed)
Pt from PCP for wellness appointment.  , reports shortness of breath with activity, O2 sat in 65  RA per EMS . Hx COPD, pulmonary fibrosis. With 2 L O2 sat increased to 91 %. No cough . No chest pain. Alert and oriented x 4.

## 2016-08-08 NOTE — ED Notes (Signed)
Bed: RESA Expected date:  Expected time:  Means of arrival:  Comments: EMS hypoxia

## 2016-08-08 NOTE — ED Provider Notes (Signed)
[place] DEPT Provider Note   CSN: [id number] Arrival date & time: 08/08/16  1636     History   Chief Complaint Chief Complaint  Patient presents with  . Shortness of Breath    HPI Linda Kelly is a 82 y.o. female.  HPI Patient presents with concern of lightheadedness, dyspnea. Patient states that she was generally well until yesterday, when she had an episode of lightheadedness, while at rest. No substantial dyspnea until awakening today, when she noticed she was dyspneic, too fatigued to perform her typical daily routine. Currently, at rest, the patient has minimal complaints, including no pain, dyspnea, lightheadedness, syncope, nausea. Patient denies recent medication changes, diet changes, activity changes. She acknowledges a history of COPD, pulmonary fibrosis.  Past Medical History:  Diagnosis Date  . Anxiety   . Anxiety and depression   . Arthritis   . Benign neoplasm of esophagus   . CAD (coronary artery disease)   . Cancer [facility])    Skin cancer on back  . Carotid artery disease ([place])   . CHF (congestive heart failure) ([place])   . Chronic gastritis   . Colon, diverticulosis   . COPD (chronic obstructive pulmonary disease) ([place])   . Depression   . GERD (gastroesophageal reflux disease)   . Hiatal hernia   . High frequency hearing loss   . Hyperlipidemia   . IBS (irritable bowel syndrome)   . Idiopathic pulmonary fibrosis   . Internal hemorrhoid   . MI (myocardial infarction) 1985  . Other nonthrombocytopenic purpuras   . PONV (postoperative nausea and vomiting)   . PVD (peripheral vascular disease) ([place])   . Shortness of breath    with exertion  . Solitary pulmonary nodule   . Stroke [facility])    Hx: of several mini -strokes  . Tinnitus    chronic  . Vertigo     Patient Active Problem List   Diagnosis Date Noted  . Lumbar radiculopathy 08/01/2016  . Left-sided low back pain without sciatica 03/28/2016  . Cephalalgia 03/10/2016  . Overactive  bladder 02/21/2016  . Poor balance 02/21/2016  . Chronic lower back pain 02/21/2016  . Carotid stenosis 03/03/2014  . Thoracic aorta atherosclerosis ([place]) 02/04/2014  . Change in bowel habits 05/22/2011  . URINARY INCONTINENCE 12/22/2010  . ABDOMINAL BRUIT 08/10/2010  . FASTING HYPERGLYCEMIA 11/15/2009  . Depression 08/01/2009  . GERD 08/01/2009  . IRRITABLE BOWEL SYNDROME 12/27/2008  . TINNITUS, CHRONIC 11/05/2008  . HEARING LOSS, HIGH FREQUENCY 11/05/2008  . HIATAL HERNIA 08/11/2008  . OTHER NONTHROMBOCYTOPENIC PURPURAS 06/08/2008  . ORTHOSTATIC HYPOTENSION 06/08/2008  . Idiopathic interstitial pneumonia/ UIP 11/20/2007  . Lung nodule 11/20/2007  . OBESITY, MILD 11/19/2007  . Hyperlipidemia 10/09/2007  . Tobacco abuse, in remission 10/09/2007  . Essential hypertension 10/09/2007  . Coronary atherosclerosis 10/09/2007  . Peripheral vascular disease ([place]) 10/09/2007  . COPD mixed type ([place]) 10/09/2007  . VERTIGO 03/31/2007  . HEADACHE 03/31/2007    Past Surgical History:  Procedure Laterality Date  . ANGIOPLASTY  1997, 98, 99  . BREAST SURGERY     Hx; of biopsy  . CAROTID ENDARTERECTOMY     left side x 3, saphenous vein graft from left leg  . CATARACT EXTRACTION W/ INTRAOCULAR LENS  IMPLANT, BILATERAL    . COLON SURGERY    . COLONOSCOPY  11-27-01, 10-30-05, 05-24-11   diverticulosis, hemorrhoids  . DENTAL SURGERY     2012   . ENDARTERECTOMY Right 03/03/2014   Procedure: RIGHT CAROTID ENDARTERECTOMY WITH  PATCH ANGIOPLASTY;  Surgeon: Rosetta Posner, MD;  Location: [facility];  Service: Vascular;  Laterality: Right;  . FOOT SURGERY     bilateral  . SHOULDER SURGERY     left  . TOE SURGERY     right foot  . TONSILLECTOMY    . TUBAL LIGATION    . UPPER GASTROINTESTINAL ENDOSCOPY  11-27-01, 08-18-08   Hiatal hernia, benign esophagus neoplasia, gastritis, tortuous esophagus 71 Maloney dilation performed    OB History    No data available       Home Medications    Prior to  Admission medications   Medication Sig Start Date End Date Taking? Authorizing Provider  aspirin EC 81 MG tablet Take 81 mg by mouth daily.    Historical Provider, MD  cholecalciferol (VITAMIN D) 1000 UNITS tablet Take 1,000 Units by mouth daily.    Historical Provider, MD  clopidogrel (PLAVIX) 75 MG tablet Take 1 tablet (75 mg total) by mouth daily. 06/07/16   Binnie Rail, MD  folic acid (FOLVITE) A999333 MCG tablet Take 400 mcg by mouth daily.      Historical Provider, MD  loratadine (CLARITIN) 10 MG tablet Take 10 mg by mouth daily as needed for allergies.    Historical Provider, MD  montelukast (SINGULAIR) 10 MG tablet Take 1 tablet (10 mg total) by mouth at bedtime. 03/22/16   Binnie Rail, MD  Omega-3 Fatty Acids (FISH OIL) 1000 MG CAPS Take 1,000 mg by mouth daily.     Historical Provider, MD  ranitidine (ZANTAC) 150 MG tablet TAKE 1 TABLET BY MOUTH TWICE DAILY 02/21/16   Binnie Rail, MD  sertraline (ZOLOFT) 50 MG tablet Take 1 tablet (50 mg total) by mouth daily. 02/21/16   Binnie Rail, MD  simvastatin (ZOCOR) 40 MG tablet Take 1 tablet (40 mg total) by mouth daily. 02/21/16   Binnie Rail, MD  tolterodine (DETROL) 2 MG tablet Take 1 tablet (2 mg total) by mouth at bedtime. 06/18/16   Binnie Rail, MD  umeclidinium-vilanterol (ANORO ELLIPTA) 62.5-25 MCG/INH AEPB Inhale 1 puff into the lungs daily. 06/20/16   Deneise Lever, MD  vitamin E 400 UNIT capsule Take 400 Units by mouth daily.    Historical Provider, MD    Family History Family History  Problem Relation Age of Onset  . Heart attack Father     deceased at 14, MGF  . Colon cancer Father   . Prostate cancer Father   . Heart disease Father   . Hyperlipidemia Father   . Heart disease Mother     deaceased at age 83  . Colitis Mother   . Stroke Mother   . Hyperlipidemia Mother   . Colitis Sister   . Hyperlipidemia Sister   . Other Brother     deceased age 37; war  . Hyperlipidemia Brother   . Breast cancer Maternal Aunt       great   . Lung cancer Paternal Aunt     great  . Vasculitis Son   . Hyperlipidemia Son   . Hypertension Son   . Heart attack Son   . Cancer Sister     unknown  . Hyperlipidemia Sister   . Hyperlipidemia Daughter   . Hypertension Daughter   . COPD Neg Hx   . Asthma Neg Hx     Social History Social History  Substance Use Topics  . Smoking status: Former Smoker    Packs/day: 0.50  Years: 60.00    Types: Cigarettes    Quit date: 02/26/2014  . Smokeless tobacco: Never Used  . Alcohol use No     Allergies   Review of patient's allergies indicates no known allergies.   Review of Systems Review of Systems  Constitutional:       Per HPI, otherwise negative  HENT:       Per HPI, otherwise negative  Respiratory:       Per HPI, otherwise negative  Cardiovascular:       Per HPI, otherwise negative  Gastrointestinal: Negative for vomiting.  Endocrine:       Negative aside from HPI  Genitourinary:       Neg aside from HPI   Musculoskeletal:       Per HPI, otherwise negative  Skin: Negative.   Neurological: Negative for syncope.     Physical Exam Updated Vital Signs BP 110/78   Pulse 86   Temp 98.2 F (36.8 C) (Oral)   Resp 25   SpO2 95%   Physical Exam  Constitutional: She is oriented to person, place, and time. She appears well-developed and well-nourished. No distress.  HENT:  Head: Normocephalic and atraumatic.  Eyes: Conjunctivae and EOM are normal.  Cardiovascular: Normal rate and regular rhythm.   Pulmonary/Chest: Effort normal. No stridor. No respiratory distress. She has decreased breath sounds.  Abdominal: She exhibits no distension.  Musculoskeletal: She exhibits no edema.  Neurological: She is alert and oriented to person, place, and time. No cranial nerve deficit.  Skin: Skin is warm and dry.  Psychiatric: She has a normal mood and affect.  Nursing note and vitals reviewed.    ED Treatments / Results  Labs (all labs ordered are  listed, but only abnormal results are displayed) Labs Reviewed  COMPREHENSIVE METABOLIC PANEL - Abnormal; Notable for the following:       Result Value   Glucose, Bld 102 (*)    BUN 26 (*)    Calcium 8.1 (*)    ALT 13 (*)    GFR calc non Af Amer 55 (*)    All other components within normal limits  CBC WITH DIFFERENTIAL/PLATELET - Abnormal; Notable for the following:    WBC 15.3 (*)    RBC 3.59 (*)    Hemoglobin 11.2 (*)    HCT 33.9 (*)    RDW 15.9 (*)    Neutro Abs 10.9 (*)    Monocytes Absolute 1.6 (*)    All other components within normal limits  TROPONIN I - Abnormal; Notable for the following:    Troponin I 11.90 (*)    All other components within normal limits  BRAIN NATRIURETIC PEPTIDE - Abnormal; Notable for the following:    B Natriuretic Peptide 1,224.6 (*)    All other components within normal limits    EKG  EKG Interpretation  Date/Time:  Wednesday August 08 2016 16:49:34 EDT Ventricular Rate:  86 PR Interval:    QRS Duration: 110 QT Interval:  445 QTC Calculation: 533 R Axis:   43 Text Interpretation:  Sinus rhythm Borderline repolarization abnormality Prolonged QT interval No significant change since last tracing Abnormal ekg Confirmed by Carmin Muskrat  MD [phone]) on 08/08/2016 4:57:35 PM     Pulse oximetry 98% with 2 L nasal cannula abnormal Cardiac monitor sinus rhythm, rate 90 normal   Radiology Dg Chest 2 View  Result Date: 08/08/2016 CLINICAL DATA:  Shortness of breath with exertion EXAM: CHEST  2 VIEW COMPARISON:  07/12/2016  FINDINGS: Cardiac shadow is enlarged but stable. Diffuse increased interstitial changes are noted likely representing some interstitial edema superimposed over more chronic fibrotic changes. No sizable effusion is noted. No focal confluent infiltrate is seen. IMPRESSION: Likely degree of interstitial edema superimposed over more chronic fibrotic changes. Electronically Signed   By: Inez Catalina M.D.   On: 08/08/2016 17:30     Procedures Procedures (including critical care time)  Medications Ordered in ED Medications  furosemide (LASIX) injection 40 mg (40 mg Intravenous Given 08/08/16 1907)   Heparin  Initial Impression / Assessment and Plan / ED Course  I have reviewed the triage vital signs and the nursing notes.  Pertinent labs & imaging results that were available during my care of the patient were reviewed by me and considered in my medical decision making (see chart for details).  Clinical Course    Patient's labs notable for elevated troponin, BNP. On repeat exam the patient remains calm, no overt distress, no chest pain. I discussed all findings the patient and her daughter. We discussed the importance of admission for further evaluation, management. Patient again reiterates, no history of congestive heart failure, though she does have a history of MI inferior distant past.   Final Clinical Impressions(s) / ED Diagnoses  Allergies female pulmonary fibrosis, COPD presents after an episode of near syncope yesterday, fatigue and lightheadedness earlier today. Here she is awake and alert, with no chest pain, but appears tired. Patient's evaluation notable for elevated troponin, greater than 11, as well as BNP greater than 1200. Patient has no history of congestive heart failure, does have history of ACS. Patient is currently taking Plavix, but with concern for NSTEMI, CHF, patient received Lasix, heparin, was admitted after discussed her case with cardiology.   CRITICAL CARE Performed by: Carmin Muskrat Total critical care time: 35 minutes Critical care time was exclusive of separately billable procedures and treating other patients. Critical care was necessary to treat or prevent imminent or life-threatening deterioration. Critical care was time spent personally by me on the following activities: development of treatment plan with patient and/or surrogate as well as nursing, discussions  with consultants, evaluation of patient's response to treatment, examination of patient, obtaining history from patient or surrogate, ordering and performing treatments and interventions, ordering and review of laboratory studies, ordering and review of radiographic studies, pulse oximetry and re-evaluation of patient's condition.    Carmin Muskrat, MD 08/08/16 1911

## 2016-08-08 NOTE — ED Notes (Signed)
Unable to start heparin gtt due to not being able to get a pump.

## 2016-08-09 DIAGNOSIS — Z9861 Coronary angioplasty status: Secondary | ICD-10-CM

## 2016-08-09 DIAGNOSIS — I214 Non-ST elevation (NSTEMI) myocardial infarction: Principal | ICD-10-CM

## 2016-08-09 DIAGNOSIS — I509 Heart failure, unspecified: Secondary | ICD-10-CM

## 2016-08-09 DIAGNOSIS — I5041 Acute combined systolic (congestive) and diastolic (congestive) heart failure: Secondary | ICD-10-CM

## 2016-08-09 DIAGNOSIS — J449 Chronic obstructive pulmonary disease, unspecified: Secondary | ICD-10-CM

## 2016-08-09 DIAGNOSIS — J8410 Pulmonary fibrosis, unspecified: Secondary | ICD-10-CM

## 2016-08-09 DIAGNOSIS — I2510 Atherosclerotic heart disease of native coronary artery without angina pectoris: Secondary | ICD-10-CM

## 2016-08-09 LAB — CBC
HEMATOCRIT: 37.1 % (ref 36.0–46.0)
Hemoglobin: 12.2 g/dL (ref 12.0–15.0)
MCH: 32.1 pg (ref 26.0–34.0)
MCHC: 32.9 g/dL (ref 30.0–36.0)
MCV: 97.6 fL (ref 78.0–100.0)
PLATELETS: 210 10*3/uL (ref 150–400)
RBC: 3.8 MIL/uL — AB (ref 3.87–5.11)
RDW: 15.9 % — ABNORMAL HIGH (ref 11.5–15.5)
WBC: 14.1 10*3/uL — ABNORMAL HIGH (ref 4.0–10.5)

## 2016-08-09 LAB — PROTIME-INR
INR: 1.19
Prothrombin Time: 15.1 seconds (ref 11.4–15.2)

## 2016-08-09 LAB — GLUCOSE, CAPILLARY
GLUCOSE-CAPILLARY: 91 mg/dL (ref 65–99)
GLUCOSE-CAPILLARY: 91 mg/dL (ref 65–99)
Glucose-Capillary: 108 mg/dL — ABNORMAL HIGH (ref 65–99)
Glucose-Capillary: 172 mg/dL — ABNORMAL HIGH (ref 65–99)

## 2016-08-09 LAB — TROPONIN I: TROPONIN I: 9.78 ng/mL — AB (ref <0.03)

## 2016-08-09 LAB — LIPID PANEL
Cholesterol: 163 mg/dL (ref 0–200)
HDL: 42 mg/dL (ref >40)
LDL Cholesterol: 92 mg/dL (ref 0–99)
Total CHOL/HDL Ratio: 3.9 RATIO
Triglycerides: 147 mg/dL (ref <150)
VLDL: 29 mg/dL (ref 0–40)

## 2016-08-09 LAB — BASIC METABOLIC PANEL
ANION GAP: 9 (ref 5–15)
BUN: 24 mg/dL — ABNORMAL HIGH (ref 6–20)
CALCIUM: 8.4 mg/dL — AB (ref 8.9–10.3)
CO2: 31 mmol/L (ref 22–32)
Chloride: 100 mmol/L — ABNORMAL LOW (ref 101–111)
Creatinine, Ser: 0.95 mg/dL (ref 0.44–1.00)
GFR, EST NON AFRICAN AMERICAN: 53 mL/min — AB (ref >60)
Glucose, Bld: 107 mg/dL — ABNORMAL HIGH (ref 65–99)
POTASSIUM: 4 mmol/L (ref 3.5–5.1)
Sodium: 140 mmol/L (ref 135–145)

## 2016-08-09 LAB — APTT: APTT: 46 s — AB (ref 24–36)

## 2016-08-09 LAB — HEPARIN LEVEL (UNFRACTIONATED)
HEPARIN UNFRACTIONATED: 0.18 [IU]/mL — AB (ref 0.30–0.70)
HEPARIN UNFRACTIONATED: 0.24 [IU]/mL — AB (ref 0.30–0.70)
Heparin Unfractionated: 0.14 IU/mL — ABNORMAL LOW (ref 0.30–0.70)

## 2016-08-09 MED ORDER — ONDANSETRON HCL 4 MG/2ML IJ SOLN: 4.0000 mg | Freq: Four times a day (QID) | INTRAMUSCULAR | Status: DC | PRN

## 2016-08-09 MED ORDER — LORATADINE 10 MG PO TABS: 10.0000 mg | ORAL_TABLET | Freq: Every day | ORAL | Status: DC | PRN

## 2016-08-09 MED ORDER — FOLIC ACID 1 MG PO TABS
0.5000 mg | ORAL_TABLET | Freq: Every day | ORAL | Status: DC
  Administered 2016-08-09 – 2016-08-13 (×6): 0.5 mg via ORAL
  Filled 2016-08-09 (×6): qty 1

## 2016-08-09 MED ORDER — ASPIRIN EC 81 MG PO TBEC
81.0000 mg | DELAYED_RELEASE_TABLET | Freq: Every day | ORAL | Status: DC
  Administered 2016-08-09 – 2016-08-14 (×6): 81 mg via ORAL
  Filled 2016-08-09 (×6): qty 1

## 2016-08-09 MED ORDER — METOPROLOL TARTRATE 12.5 MG HALF TABLET
12.5000 mg | ORAL_TABLET | Freq: Two times a day (BID) | ORAL | Status: DC
  Administered 2016-08-09 – 2016-08-14 (×10): 12.5 mg via ORAL
  Filled 2016-08-09 (×11): qty 1

## 2016-08-09 MED ORDER — UMECLIDINIUM-VILANTEROL 62.5-25 MCG/INH IN AEPB: 1.0000 | INHALATION_SPRAY | Freq: Every day | RESPIRATORY_TRACT | Status: DC

## 2016-08-09 MED ORDER — ASPIRIN 81 MG PO CHEW
324.0000 mg | CHEWABLE_TABLET | ORAL | Status: AC
  Administered 2016-08-09: 324 mg via ORAL
  Filled 2016-08-09: qty 4

## 2016-08-09 MED ORDER — OXYBUTYNIN CHLORIDE ER 10 MG PO TB24
10.0000 mg | ORAL_TABLET | Freq: Every day | ORAL | Status: DC
  Administered 2016-08-09 – 2016-08-13 (×6): 10 mg via ORAL
  Filled 2016-08-09 (×7): qty 1

## 2016-08-09 MED ORDER — SIMVASTATIN 40 MG PO TABS
40.0000 mg | ORAL_TABLET | Freq: Every day | ORAL | Status: DC
  Administered 2016-08-09 – 2016-08-13 (×6): 40 mg via ORAL
  Filled 2016-08-09 (×6): qty 1

## 2016-08-09 MED ORDER — CLOPIDOGREL BISULFATE 75 MG PO TABS
75.0000 mg | ORAL_TABLET | Freq: Every day | ORAL | Status: DC
  Administered 2016-08-09 – 2016-08-14 (×6): 75 mg via ORAL
  Filled 2016-08-09 (×6): qty 1

## 2016-08-09 MED ORDER — SERTRALINE HCL 50 MG PO TABS
50.0000 mg | ORAL_TABLET | Freq: Every day | ORAL | Status: DC
  Administered 2016-08-09 – 2016-08-13 (×6): 50 mg via ORAL
  Filled 2016-08-09 (×6): qty 1

## 2016-08-09 MED ORDER — UMECLIDINIUM BROMIDE 62.5 MCG/INH IN AEPB
1.0000 | INHALATION_SPRAY | Freq: Every day | RESPIRATORY_TRACT | Status: DC
  Administered 2016-08-09 – 2016-08-14 (×4): 1 via RESPIRATORY_TRACT
  Filled 2016-08-09 (×2): qty 7

## 2016-08-09 MED ORDER — ALPRAZOLAM 0.25 MG PO TABS
0.2500 mg | ORAL_TABLET | Freq: Two times a day (BID) | ORAL | Status: DC | PRN
  Administered 2016-08-09 – 2016-08-10 (×3): 0.25 mg via ORAL
  Filled 2016-08-09 (×3): qty 1

## 2016-08-09 MED ORDER — ACETAMINOPHEN 325 MG PO TABS
650.0000 mg | ORAL_TABLET | ORAL | Status: DC | PRN
  Administered 2016-08-10 – 2016-08-13 (×3): 650 mg via ORAL
  Filled 2016-08-09 (×3): qty 2

## 2016-08-09 MED ORDER — NITROGLYCERIN 0.4 MG SL SUBL: 0.4000 mg | SUBLINGUAL_TABLET | SUBLINGUAL | Status: DC | PRN

## 2016-08-09 MED ORDER — HEPARIN BOLUS VIA INFUSION
2000.0000 [IU] | Freq: Once | INTRAVENOUS | Status: AC
  Administered 2016-08-09: 2000 [IU] via INTRAVENOUS
  Filled 2016-08-09: qty 2000

## 2016-08-09 MED ORDER — FUROSEMIDE 10 MG/ML IJ SOLN
40.0000 mg | Freq: Once | INTRAMUSCULAR | Status: AC
  Administered 2016-08-09: 40 mg via INTRAVENOUS
  Filled 2016-08-09: qty 4

## 2016-08-09 MED ORDER — FAMOTIDINE 20 MG PO TABS
20.0000 mg | ORAL_TABLET | Freq: Every day | ORAL | Status: DC
  Administered 2016-08-09 – 2016-08-14 (×6): 20 mg via ORAL
  Filled 2016-08-09 (×6): qty 1

## 2016-08-09 MED ORDER — ARFORMOTEROL TARTRATE 15 MCG/2ML IN NEBU
15.0000 ug | INHALATION_SOLUTION | Freq: Two times a day (BID) | RESPIRATORY_TRACT | Status: DC
  Administered 2016-08-09 – 2016-08-14 (×11): 15 ug via RESPIRATORY_TRACT
  Filled 2016-08-09 (×10): qty 2

## 2016-08-09 MED ORDER — ASPIRIN 300 MG RE SUPP: 300.0000 mg | RECTAL | Status: AC

## 2016-08-09 MED ORDER — ASPIRIN EC 81 MG PO TBEC: 81.0000 mg | DELAYED_RELEASE_TABLET | Freq: Every day | ORAL | Status: DC

## 2016-08-09 MED ORDER — MONTELUKAST SODIUM 10 MG PO TABS
10.0000 mg | ORAL_TABLET | Freq: Every day | ORAL | Status: DC
  Administered 2016-08-09 – 2016-08-13 (×6): 10 mg via ORAL
  Filled 2016-08-09 (×6): qty 1

## 2016-08-09 NOTE — Progress Notes (Signed)
ANTICOAGULATION CONSULT NOTE - Follow Up Consult  Pharmacy Consult for Heparin  Indication: chest pain/ACS  No Known Allergies  Patient Measurements: Height: 4\' 10"  (147.3 cm) Weight: 145 lb 8 oz (66 kg) IBW/kg (Calculated) : 40.9  Vital Signs: Temp: 97.8 F (36.6 C) (10/12 0017) Temp Source: Oral (10/12 0017) BP: 130/108 (10/12 0123) Pulse Rate: 83 (10/12 0123)  Labs:  Recent Labs  08/08/16 1742 08/09/16 0140  HGB 11.2* 12.2  HCT 33.9* 37.1  PLT 219 210  APTT  --  46*  LABPROT  --  15.1  INR  --  1.19  HEPARINUNFRC  --  0.14*  CREATININE 0.92  --   TROPONINI 11.90*  --     Estimated Creatinine Clearance: 35.9 mL/min (by C-G formula based on SCr of 0.92 mg/dL).  Assessment: Heparin for elevated troponin, considering cath today, initial heparin level is low, no issues per RN.   Goal of Therapy:  Heparin level 0.3-0.7 units/ml Monitor platelets by anticoagulation protocol: Yes   Plan:  -Inc heparin to 800 units/hr -1100 HL  Jurnie Garritano 08/09/2016,2:45 AM

## 2016-08-09 NOTE — Consult Note (Signed)
[facility] CM Primary Care Navigator  08/09/2016  Linda Kelly [birth year] [id number]  Met with patient and daughter [name]) at the bedside to identify possible discharge needs. Patient shared that one of her daughters came to see her and insisted for her to get medical care since she was weak and unable to drive. Her oxygen level was low at the doctor's office and was lightheaded that led to this admission. Patient endorses Dr. Billey Gosling with [facility]  as her primary care provider.    Patient shared using [facility] [place] to obtain medications without difficulty. 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 She verbalized managing her own medicines at home using "pill box" system.    Patient lives alone and is able to drive self prior to this admission per daughter. Patient has 6 children and she has 2 daughters and 2 sons that live close by who can provide transportation to doctors' appointments when needed.  Patient shares being the primary caregiver for herself at home but she has her children around her to provide assistance whenever needed according to daughter.  Patient voiced understanding to call primary care provider's office for a post discharge follow-up appointment within a week or sooner if needs arise. She mentioned having a scheduled appointment with primary care provider towards the end of the month but she states understanding to reschedule at an earlier date for post hospital follow-up. Patient letter provided as her reminder.  She denies any needs or concerns at this time in managing her health and condition at home.    For additional questions please contact:  Edwena Felty A. Calob Baskette, BSN, RN-BC [facility] PRIMARY CARE Navigator Cell: [phone]

## 2016-08-09 NOTE — Progress Notes (Signed)
ANTICOAGULATION CONSULT NOTE - Follow Up Consult  Pharmacy Consult for Heparin  Indication: chest pain/ACS  No Known Allergies  Patient Measurements: Height: 4\' 10"  (147.3 cm) Weight: 145 lb 8 oz (66 kg) IBW/kg (Calculated) : 40.9  Vital Signs: Temp: 98.4 F (36.9 C) (10/12 1127) Temp Source: Oral (10/12 1127) BP: 130/83 (10/12 1008) Pulse Rate: 80 (10/12 1008)  Labs:  Recent Labs  08/08/16 1742 08/09/16 0140 08/09/16 1059  HGB 11.2* 12.2  --   HCT 33.9* 37.1  --   PLT 219 210  --   APTT  --  46*  --   LABPROT  --  15.1  --   INR  --  1.19  --   HEPARINUNFRC  --  0.14* 0.18*  CREATININE 0.92 0.95  --   TROPONINI 11.90* 9.78*  --     Estimated Creatinine Clearance: 34.8 mL/min (by C-G formula based on SCr of 0.95 mg/dL).  Assessment: 82 yo female with NSTEMI on heparin. Heparin level remains below goal after increase to 800 units/hr.   Goal of Therapy:  Heparin level 0.3-0.7 units/ml Monitor platelets by anticoagulation protocol: Yes   Plan:  -heparin bolus 2000 units and increase to 1000 units/hr -Heparin level in 8 hours and daily wth CBC daily  Hildred Laser, Pharm D 08/09/2016 1:22 PM

## 2016-08-09 NOTE — Progress Notes (Signed)
ANTICOAGULATION CONSULT NOTE - Follow Up Consult  Pharmacy Consult for Heparin  Indication: chest pain/ACS  No Known Allergies  Patient Measurements: Height: 4\' 10"  (147.3 cm) Weight: 145 lb 8 oz (66 kg) IBW/kg (Calculated) : 40.9  Vital Signs: Temp: 98.2 F (36.8 C) (10/12 2043) Temp Source: Oral (10/12 2043) BP: 121/62 (10/12 2043) Pulse Rate: 85 (10/12 2043)  Labs:  Recent Labs  08/08/16 1742 08/09/16 0140 08/09/16 1059 08/09/16 2137  HGB 11.2* 12.2  --   --   HCT 33.9* 37.1  --   --   PLT 219 210  --   --   APTT  --  46*  --   --   LABPROT  --  15.1  --   --   INR  --  1.19  --   --   HEPARINUNFRC  --  0.14* 0.18* 0.24*  CREATININE 0.92 0.95  --   --   TROPONINI 11.90* 9.78*  --   --     Estimated Creatinine Clearance: 34.8 mL/min (by C-G formula based on SCr of 0.95 mg/dL).  Assessment: Heparin for elevated troponin, possible cath 10/13, heparin level remains low despite rate increases   Goal of Therapy:  Heparin level 0.3-0.7 units/ml Monitor platelets by anticoagulation protocol: Yes   Plan:  -Inc heparin to 1200 units/hr -0800 HL -Possible cath 10/13  Narda Bonds 08/09/2016,11:28 PM

## 2016-08-09 NOTE — Progress Notes (Signed)
Subjective:  She denies chest pain, no SOB at rest.  Objective:  Vital Signs in the last 24 hours: Temp:  [97.6 F (36.4 C)-98.4 F (36.9 C)] 98.4 F (36.9 C) (10/12 1127) Pulse Rate:  [51-98] 80 (10/12 1008) Resp:  [13-31] 20 (10/12 0705) BP: (87-133)/(65-115) 130/83 (10/12 1008) SpO2:  [60 %-98 %] 93 % (10/12 1127) Weight:  [145 lb 8 oz (66 kg)-148 lb 8 oz (67.4 kg)] 145 lb 8 oz (66 kg) (10/12 0440)  Intake/Output from previous day:  Intake/Output Summary (Last 24 hours) at 08/09/16 1400 Last data filed at 08/09/16 1319  Gross per 24 hour  Intake           426.08 ml  Output             2175 ml  Net         -1748.92 ml    Physical Exam: General appearance: alert, cooperative, appears stated age, no distress and on O2 Lungs: bilateral velcro crackles 1/3 up Heart: regular rate and rhythm Extremities: 1+ edema Neurologic: Grossly normal   Rate: 78  Rhythm: normal sinus rhythm  Lab Results:  Recent Labs  08/08/16 1742 08/09/16 0140  WBC 15.3* 14.1*  HGB 11.2* 12.2  PLT 219 210    Recent Labs  08/08/16 1742 08/09/16 0140  NA 135 140  K 3.7 4.0  CL 103 100*  CO2 24 31  GLUCOSE 102* 107*  BUN 26* 24*  CREATININE 0.92 0.95    Recent Labs  08/08/16 1742 08/09/16 0140  TROPONINI 11.90* 9.78*    Recent Labs  08/09/16 0140  INR 1.19    Scheduled Meds: . arformoterol  15 mcg Nebulization BID  . aspirin EC  81 mg Oral Daily  . clopidogrel  75 mg Oral Daily  . famotidine  20 mg Oral Daily  . folic acid  0.5 mg Oral QHS  . metoprolol tartrate  12.5 mg Oral BID  . montelukast  10 mg Oral QHS  . oxybutynin  10 mg Oral QHS  . sertraline  50 mg Oral QHS  . simvastatin  40 mg Oral QHS  . umeclidinium bromide  1 puff Inhalation Daily   Continuous Infusions: . heparin 1,000 Units/hr (08/09/16 1336)   PRN Meds:.acetaminophen, ALPRAZolam, loratadine, nitroGLYCERIN, ondansetron (ZOFRAN) IV   Imaging: Dg Chest 2 View  Result Date:  08/08/2016 CLINICAL DATA:  Shortness of breath with exertion EXAM: CHEST  2 VIEW COMPARISON:  07/12/2016 FINDINGS: Cardiac shadow is enlarged but stable. Diffuse increased interstitial changes are noted likely representing some interstitial edema superimposed over more chronic fibrotic changes. No sizable effusion is noted. No focal confluent infiltrate is seen. IMPRESSION: Likely degree of interstitial edema superimposed over more chronic fibrotic changes. Electronically Signed   By: Inez Catalina M.D.   On: 08/08/2016 17:30    Assessment/Plan:  82 y/o female with carotid artery disease, CAD, HLD, and pulmonary fibrosis. Her cardiac issues date back to 13 when she had an MI. She was treated with a balloon angioplasty at that time to her RCA. She has been stable since then from a cardiac standpoint. Last stress myoview in October of 2010 showed inferior and inferolateral infarct but no significant ischemia. Ejection fraction was 60%. Renal dopplers 12/12 showed stable 1-59 bilateral renal artery stenosis. Patient had a right carotid endarterectomy in May 2015 and tolerated this well. The patient has dyspnea with more extreme activities but not with routine activities. She lives alone and her daughter  lives nearby. The pt told her daughter she didn't feel well yesterday. No specific complaints of dyspnea or chest pain. They saw a Wellness nurse and then sent to the ED. Her Troponin have been elevated- initially 11- down to 9. Her BNP was also elelvated 1200.    Principal Problem:   NSTEMI (non-ST elevated myocardial infarction) [facility]) Active Problems:   Acute CHF (HCC)-etiology not yet determined   CAD S/P percutaneous coronary angioplasty 1985   COPD mixed type [facility])   Essential hypertension   Carotid stenosis-s/p CEA 2015   PLAN: She has been NPO all day waiting for disposition-? Cath. I think she is still volume overloaded though this is a little difficult to tell with her poor history and  baseline pulmonary fibrosis. It may be best to continue to diurese and re evaluate in am. Will give Lasix 40 mg IV x 1. Check BMP in am. Check echo. Discussed with pt and her daughter.   Note: Pt is on the cath schedule for Dr Tamala Julian at 12 noon tomorrow- I will write orders after she is evaluated in am.   Kerin Ransom PA-C 08/09/2016, 2:00 PM [phone]

## 2016-08-09 NOTE — Progress Notes (Signed)
Contacted MD about patient's plan of the day (cath lab or not). Patient to remain NPO until Dr. Debara Pickett rounds.  Margaret Pyle, RN

## 2016-08-10 ENCOUNTER — Encounter (HOSPITAL_COMMUNITY)
Admission: EM | Disposition: A | Discharge status: Home / Self Care | Payer: Self-pay | Source: Home / Self Care | Attending: Cardiology

## 2016-08-10 ENCOUNTER — Inpatient Hospital Stay (HOSPITAL_COMMUNITY): Payer: PPO

## 2016-08-10 DIAGNOSIS — I10 Essential (primary) hypertension: Secondary | ICD-10-CM

## 2016-08-10 DIAGNOSIS — I213 ST elevation (STEMI) myocardial infarction of unspecified site: Secondary | ICD-10-CM

## 2016-08-10 HISTORY — PX: CARDIAC CATHETERIZATION: SHX172

## 2016-08-10 LAB — CBC
HCT: 34.7 % — ABNORMAL LOW (ref 36.0–46.0)
HEMOGLOBIN: 11.2 g/dL — AB (ref 12.0–15.0)
MCH: 31.2 pg (ref 26.0–34.0)
MCHC: 32.3 g/dL (ref 30.0–36.0)
MCV: 96.7 fL (ref 78.0–100.0)
Platelets: 219 10*3/uL (ref 150–400)
RBC: 3.59 MIL/uL — AB (ref 3.87–5.11)
RDW: 15.5 % (ref 11.5–15.5)
WBC: 16.5 10*3/uL — ABNORMAL HIGH (ref 4.0–10.5)

## 2016-08-10 LAB — BASIC METABOLIC PANEL
Anion gap: 9 (ref 5–15)
BUN: 23 mg/dL — ABNORMAL HIGH (ref 6–20)
CO2: 28 mmol/L (ref 22–32)
Calcium: 8.2 mg/dL — ABNORMAL LOW (ref 8.9–10.3)
Chloride: 99 mmol/L — ABNORMAL LOW (ref 101–111)
Creatinine, Ser: 1.04 mg/dL — ABNORMAL HIGH (ref 0.44–1.00)
GFR calc Af Amer: 55 mL/min — ABNORMAL LOW (ref >60)
GFR calc non Af Amer: 48 mL/min — ABNORMAL LOW (ref >60)
Glucose, Bld: 116 mg/dL — ABNORMAL HIGH (ref 65–99)
Potassium: 3.7 mmol/L (ref 3.5–5.1)
Sodium: 136 mmol/L (ref 135–145)

## 2016-08-10 LAB — ECHOCARDIOGRAM COMPLETE
Height: 58 in
Weight: 2257.6 oz

## 2016-08-10 LAB — HEMOGLOBIN A1C
Hgb A1c MFr Bld: 5.2 % (ref 4.8–5.6)
Mean Plasma Glucose: 103 mg/dL

## 2016-08-10 LAB — GLUCOSE, CAPILLARY
Glucose-Capillary: 107 mg/dL — ABNORMAL HIGH (ref 65–99)
Glucose-Capillary: 100 mg/dL — ABNORMAL HIGH (ref 65–99)
Glucose-Capillary: 95 mg/dL (ref 65–99)

## 2016-08-10 LAB — POCT ACTIVATED CLOTTING TIME: Activated Clotting Time: 114 seconds

## 2016-08-10 LAB — HEPARIN LEVEL (UNFRACTIONATED): HEPARIN UNFRACTIONATED: 0.49 [IU]/mL (ref 0.30–0.70)

## 2016-08-10 SURGERY — LEFT HEART CATH AND CORONARY ANGIOGRAPHY

## 2016-08-10 MED ORDER — SODIUM CHLORIDE 0.9% FLUSH: 3.0000 mL | INTRAVENOUS | Status: DC | PRN

## 2016-08-10 MED ORDER — SODIUM CHLORIDE 0.9 % WEIGHT BASED INFUSION: 1.0000 mL/kg/h | INTRAVENOUS | Status: AC

## 2016-08-10 MED ORDER — HEPARIN (PORCINE) IN NACL 2-0.9 UNIT/ML-% IJ SOLN
INTRAMUSCULAR | Status: AC
  Filled 2016-08-10: qty 1500

## 2016-08-10 MED ORDER — SODIUM CHLORIDE 0.9 % IV SOLN: 250.0000 mL | INTRAVENOUS | Status: DC | PRN

## 2016-08-10 MED ORDER — SODIUM CHLORIDE 0.9% FLUSH
3.0000 mL | Freq: Two times a day (BID) | INTRAVENOUS | Status: DC
  Administered 2016-08-11 – 2016-08-14 (×7): 3 mL via INTRAVENOUS

## 2016-08-10 MED ORDER — MIDAZOLAM HCL 2 MG/2ML IJ SOLN
INTRAMUSCULAR | Status: DC | PRN
  Administered 2016-08-10: 1 mg via INTRAVENOUS

## 2016-08-10 MED ORDER — ONDANSETRON HCL 4 MG/2ML IJ SOLN: 4.0000 mg | Freq: Four times a day (QID) | INTRAMUSCULAR | Status: DC | PRN

## 2016-08-10 MED ORDER — LIDOCAINE HCL (PF) 1 % IJ SOLN
INTRAMUSCULAR | Status: AC
  Filled 2016-08-10: qty 30

## 2016-08-10 MED ORDER — IOPAMIDOL (ISOVUE-370) INJECTION 76%
INTRAVENOUS | Status: DC | PRN
  Administered 2016-08-10: 75 mL via INTRA_ARTERIAL

## 2016-08-10 MED ORDER — MIDAZOLAM HCL 2 MG/2ML IJ SOLN
INTRAMUSCULAR | Status: AC
  Filled 2016-08-10: qty 2

## 2016-08-10 MED ORDER — OXYCODONE-ACETAMINOPHEN 5-325 MG PO TABS: 1.0000 | ORAL_TABLET | ORAL | Status: DC | PRN

## 2016-08-10 MED ORDER — SODIUM CHLORIDE 0.9% FLUSH
3.0000 mL | Freq: Two times a day (BID) | INTRAVENOUS | Status: DC
  Administered 2016-08-10: 3 mL via INTRAVENOUS

## 2016-08-10 MED ORDER — FUROSEMIDE 10 MG/ML IJ SOLN
40.0000 mg | Freq: Once | INTRAMUSCULAR | Status: AC
  Administered 2016-08-11: 40 mg via INTRAVENOUS
  Filled 2016-08-10: qty 4

## 2016-08-10 MED ORDER — FENTANYL CITRATE (PF) 100 MCG/2ML IJ SOLN
INTRAMUSCULAR | Status: DC | PRN
  Administered 2016-08-10: 50 ug via INTRAVENOUS

## 2016-08-10 MED ORDER — SODIUM CHLORIDE 0.9 % IV SOLN
INTRAVENOUS | Status: DC
  Administered 2016-08-10: 11:00:00 via INTRAVENOUS

## 2016-08-10 MED ORDER — VERAPAMIL HCL 2.5 MG/ML IV SOLN
INTRAVENOUS | Status: AC
  Filled 2016-08-10: qty 2

## 2016-08-10 MED ORDER — HEPARIN (PORCINE) IN NACL 2-0.9 UNIT/ML-% IJ SOLN
INTRAMUSCULAR | Status: DC | PRN
  Administered 2016-08-10: 1500 mL

## 2016-08-10 MED ORDER — LIDOCAINE HCL (PF) 1 % IJ SOLN
INTRAMUSCULAR | Status: DC | PRN
Start: 2016-08-10 — End: 2016-08-10
  Administered 2016-08-10: 2 mL
  Administered 2016-08-10: 13 mL
  Administered 2016-08-10: 15 mL

## 2016-08-10 MED ORDER — IOPAMIDOL (ISOVUE-370) INJECTION 76%
INTRAVENOUS | Status: AC
  Filled 2016-08-10: qty 100

## 2016-08-10 MED ORDER — HEPARIN (PORCINE) IN NACL 100-0.45 UNIT/ML-% IJ SOLN: 1100.0000 [IU]/h | INTRAMUSCULAR | Status: DC

## 2016-08-10 MED ORDER — ASPIRIN 81 MG PO CHEW: 81.0000 mg | CHEWABLE_TABLET | ORAL | Status: AC

## 2016-08-10 MED ORDER — FENTANYL CITRATE (PF) 100 MCG/2ML IJ SOLN
INTRAMUSCULAR | Status: AC
  Filled 2016-08-10: qty 2

## 2016-08-10 SURGICAL SUPPLY — 13 items
CATH INFINITI 5FR MULTPACK ANG (CATHETERS) ×2 IMPLANT
CATH SWAN GANZ 7F STRAIGHT (CATHETERS) ×2 IMPLANT
GLIDESHEATH SLEND A-KIT 6F 22G (SHEATH) ×2 IMPLANT
GUIDEWIRE ANGLED .035X150CM (WIRE) ×2 IMPLANT
KIT HEART LEFT (KITS) ×3 IMPLANT
PACK CARDIAC CATHETERIZATION (CUSTOM PROCEDURE TRAY) ×3 IMPLANT
SHEATH PINNACLE 5F 10CM (SHEATH) ×4 IMPLANT
SHEATH PINNACLE 7F 10CM (SHEATH) ×2 IMPLANT
TRANSDUCER W/STOPCOCK (MISCELLANEOUS) ×3 IMPLANT
TUBING CIL FLEX 10 FLL-RA (TUBING) ×3 IMPLANT
WIRE EMERALD 3MM-J .025X260CM (WIRE) ×2 IMPLANT
WIRE EMERALD ST .035X150CM (WIRE) ×2 IMPLANT
WIRE HI TORQ VERSACORE-J 145CM (WIRE) ×2 IMPLANT

## 2016-08-10 NOTE — Progress Notes (Signed)
While checking pt BP this AM, I was called by the tech and was told pt BP 71/47. Went into assess pt, no complaints of dizziness or chest pain. Pt just wanted to use the bathroom. Paged on call PA, received orders to recheck manually and get a set of orthostatic vitals. Orthostatics were negative and manual BP was 102/72. Received new orders to hold AM metoprolol. Will pass on to dayshift. Will continue to monitor.  Linda Kelly

## 2016-08-10 NOTE — Progress Notes (Signed)
ANTICOAGULATION CONSULT NOTE - Follow Up Consult  Pharmacy Consult for Heparin  Indication: chest pain/ACS  No Known Allergies  Patient Measurements: Height: 4\' 10"  (147.3 cm) Weight: 141 lb 1.6 oz (64 kg) IBW/kg (Calculated) : 40.9  Vital Signs: Temp: 98.4 F (36.9 C) (10/13 0555) Temp Source: Oral (10/13 0555) BP: 115/59 (10/13 1455) Pulse Rate: 72 (10/13 1455)  Labs:  Recent Labs  08/08/16 1742  08/09/16 0140 08/09/16 1059 08/09/16 2137 08/10/16 0303 08/10/16 0819  HGB 11.2*  --  12.2  --   --  11.2*  --   HCT 33.9*  --  37.1  --   --  34.7*  --   PLT 219  --  210  --   --  219  --   APTT  --   --  46*  --   --   --   --   LABPROT  --   --  15.1  --   --   --   --   INR  --   --  1.19  --   --   --   --   HEPARINUNFRC  --   < > 0.14* 0.18* 0.24*  --  0.49  CREATININE 0.92  --  0.95  --   --  1.04*  --   TROPONINI 11.90*  --  9.78*  --   --   --   --   < > = values in this interval not displayed.  Estimated Creatinine Clearance: 31.3 mL/min (by C-G formula based on SCr of 1.04 mg/dL (H)).  Assessment: 82 yo female with NSTEMI on heparin. Now s/p cath and to resume heparin 8 hours post sheath removal   Goal of Therapy:  Heparin level 0.3-0.7 units/ml Monitor platelets by anticoagulation protocol: Yes   Plan:  Heparin at 1100 units / hr starting at 2300 pm 8 hour heparin level and CBC Daily labs  Thank you Anette Guarneri, PharmD [phone]  08/10/2016 3:45 PM

## 2016-08-10 NOTE — Progress Notes (Signed)
  Echocardiogram 2D Echocardiogram has been performed.  Tresa Res 08/10/2016, 10:36 AM

## 2016-08-10 NOTE — Progress Notes (Signed)
ANTICOAGULATION CONSULT NOTE - Follow Up Consult  Pharmacy Consult for Heparin  Indication: chest pain/ACS  No Known Allergies  Patient Measurements: Height: 4\' 10"  (147.3 cm) Weight: 141 lb 1.6 oz (64 kg) IBW/kg (Calculated) : 40.9  Vital Signs: Temp: 98.4 F (36.9 C) (10/13 0555) Temp Source: Oral (10/13 0555) BP: 102/72 (10/13 0620) Pulse Rate: 82 (10/13 0600)  Labs:  Recent Labs  08/08/16 1742  08/09/16 0140 08/09/16 1059 08/09/16 2137 08/10/16 0303 08/10/16 0819  HGB 11.2*  --  12.2  --   --  11.2*  --   HCT 33.9*  --  37.1  --   --  34.7*  --   PLT 219  --  210  --   --  219  --   APTT  --   --  46*  --   --   --   --   LABPROT  --   --  15.1  --   --   --   --   INR  --   --  1.19  --   --   --   --   HEPARINUNFRC  --   < > 0.14* 0.18* 0.24*  --  0.49  CREATININE 0.92  --  0.95  --   --  1.04*  --   TROPONINI 11.90*  --  9.78*  --   --   --   --   < > = values in this interval not displayed.  Estimated Creatinine Clearance: 31.3 mL/min (by C-G formula based on SCr of 1.04 mg/dL (H)).  Assessment: 82 yo female with NSTEMI on heparin. Plans note for cath today -heparin level= 0.49 and at goal  Goal of Therapy:  Heparin level 0.3-0.7 units/ml Monitor platelets by anticoagulation protocol: Yes   Plan:  -No heparin changes needed -Will follow plans post cath  Hildred Laser, Pharm D 08/10/2016 9:43 AM

## 2016-08-10 NOTE — Progress Notes (Addendum)
Site area: LFA Site Prior to Removal:  Level 0 Pressure Applied For:23min Manual:  yes  Patient Status During Pull: stable  Post Pull Site:  Level Post Pull Instructions Given: yes  Post Pull Pulses Present: doppler Dressing Applied: tegaderm  Bedrest begins @ 1500 till 1900 Comments:

## 2016-08-10 NOTE — Progress Notes (Signed)
Cath films reviewed - no acute intervention. Will d/c heparin. Continue on DAPT. Diurese.  Pixie Casino, MD, [facility] Attending Cardiologist [place]

## 2016-08-10 NOTE — Progress Notes (Signed)
Subjective:  She denies chest pain, no SOB at rest.  Objective:  Vital Signs in the last 24 hours: Temp:  [98.2 F (36.8 C)-98.4 F (36.9 C)] 98.4 F (36.9 C) (10/13 0555) Pulse Rate:  [80-85] 82 (10/13 0600) Resp:  [19-24] 24 (10/13 0600) BP: (67-130)/(47-83) 102/72 (10/13 0620) SpO2:  [87 %-97 %] 97 % (10/13 0802) Weight:  [141 lb 1.6 oz (64 kg)] 141 lb 1.6 oz (64 kg) (10/13 0555)  Intake/Output from previous day:  Intake/Output Summary (Last 24 hours) at 08/10/16 0849 Last data filed at 08/09/16 1818  Gross per 24 hour  Intake                0 ml  Output              650 ml  Net             -650 ml    Physical Exam: General appearance: alert, cooperative, appears stated age, no distress and on O2 Lungs: bilateral velcro crackles 1/3 up Heart: regular rate and rhythm Extremities: 1+ edema Neurologic: Grossly normal   Rate: 78  Rhythm: normal sinus rhythm  Lab Results:  Recent Labs  08/09/16 0140 08/10/16 0303  WBC 14.1* 16.5*  HGB 12.2 11.2*  PLT 210 219    Recent Labs  08/09/16 0140 08/10/16 0303  NA 140 136  K 4.0 3.7  CL 100* 99*  CO2 31 28  GLUCOSE 107* 116*  BUN 24* 23*  CREATININE 0.95 1.04*    Recent Labs  08/08/16 1742 08/09/16 0140  TROPONINI 11.90* 9.78*    Recent Labs  08/09/16 0140  INR 1.19    Scheduled Meds: . arformoterol  15 mcg Nebulization BID  . aspirin EC  81 mg Oral Daily  . clopidogrel  75 mg Oral Daily  . famotidine  20 mg Oral Daily  . folic acid  0.5 mg Oral QHS  . metoprolol tartrate  12.5 mg Oral BID  . montelukast  10 mg Oral QHS  . oxybutynin  10 mg Oral QHS  . sertraline  50 mg Oral QHS  . simvastatin  40 mg Oral QHS  . umeclidinium bromide  1 puff Inhalation Daily   Continuous Infusions: . heparin 1,000 Units/hr (08/09/16 1911)   PRN Meds:.acetaminophen, ALPRAZolam, loratadine, nitroGLYCERIN, ondansetron (ZOFRAN) IV   Imaging: Dg Chest 2 View  Result Date: 08/08/2016 CLINICAL DATA:   Shortness of breath with exertion EXAM: CHEST  2 VIEW COMPARISON:  07/12/2016 FINDINGS: Cardiac shadow is enlarged but stable. Diffuse increased interstitial changes are noted likely representing some interstitial edema superimposed over more chronic fibrotic changes. No sizable effusion is noted. No focal confluent infiltrate is seen. IMPRESSION: Likely degree of interstitial edema superimposed over more chronic fibrotic changes. Electronically Signed   By: Inez Catalina M.D.   On: 08/08/2016 17:30    Assessment/Plan:  82 y/o female with carotid artery disease, CAD, HLD, and pulmonary fibrosis. Her cardiac issues date back to 62 when she had an MI. She was treated with a balloon angioplasty at that time to her RCA. She has been stable since then from a cardiac standpoint. Last stress myoview in October of 2010 showed inferior and inferolateral infarct but no significant ischemia. Ejection fraction was 60%. Renal dopplers 12/12 showed stable 1-59 bilateral renal artery stenosis. Patient had a right carotid endarterectomy in May 2015 and tolerated this well. The patient has dyspnea with more extreme activities but not with routine activities. She  lives alone and her daughter lives nearby. The pt told her daughter she didn't feel well yesterday. No specific complaints of dyspnea or chest pain. They saw a Wellness nurse and then sent to the ED. Her Troponin have been elevated- initially 11- down to 9. Her BNP was also elelvated 1200.    Principal Problem:   NSTEMI (non-ST elevated myocardial infarction) ([place]) Active Problems:   Acute CHF (HCC)-etiology not yet determined   CAD S/P percutaneous coronary angioplasty 1985   COPD mixed type ([place])   Essential hypertension   Carotid stenosis-s/p CEA 2015   PLAN: She is flat in bed- no SOB. Her SCr actually got better with diuresis. She is negative 2.2L after two doses of lasix 40 mg since admission. She may need more diuresis but she appears stable for  cath. She does not have a daily diuretic ordered. Basilar velcro crackles are chronic from pulmonary fibrosis, may be helpful to f/u BNP in am.    Kerin Ransom PA-C 08/10/2016, 8:49 AM [phone]

## 2016-08-10 NOTE — Interval H&P Note (Signed)
History and Physical Interval Note: Cath Lab Visit (complete for each Cath Lab visit)  Clinical Evaluation Leading to the Procedure:   ACS: Yes.    Non-ACS:    Anginal Classification: No Symptoms  Anti-ischemic medical therapy: Minimal Therapy (1 class of medications)  Non-Invasive Test Results: No non-invasive testing performed  Prior CABG: No previous CABG       08/10/2016 11:44 AM  Linda Kelly  has presented today for surgery, with the diagnosis of n stemi  The various methods of treatment have been discussed with the patient and family. After consideration of risks, benefits and other options for treatment, the patient has consented to  Procedure(s): Left Heart Cath and Coronary Angiography (N/A) as a surgical intervention .  The patient's history has been reviewed, patient examined, no change in status, stable for surgery.  I have reviewed the patient's chart and labs.  Questions were answered to the patient's satisfaction.     Belva Crome III

## 2016-08-10 NOTE — H&P (View-Only) (Signed)
Subjective:  She denies chest pain, no SOB at rest.  Objective:  Vital Signs in the last 24 hours: Temp:  [98.2 F (36.8 C)-98.4 F (36.9 C)] 98.4 F (36.9 C) (10/13 0555) Pulse Rate:  [80-85] 82 (10/13 0600) Resp:  [19-24] 24 (10/13 0600) BP: (67-130)/(47-83) 102/72 (10/13 0620) SpO2:  [87 %-97 %] 97 % (10/13 0802) Weight:  [141 lb 1.6 oz (64 kg)] 141 lb 1.6 oz (64 kg) (10/13 0555)  Intake/Output from previous day:  Intake/Output Summary (Last 24 hours) at 08/10/16 0849 Last data filed at 08/09/16 1818  Gross per 24 hour  Intake                0 ml  Output              650 ml  Net             -650 ml    Physical Exam: General appearance: alert, cooperative, appears stated age, no distress and on O2 Lungs: bilateral velcro crackles 1/3 up Heart: regular rate and rhythm Extremities: 1+ edema Neurologic: Grossly normal   Rate: 78  Rhythm: normal sinus rhythm  Lab Results:  Recent Labs  08/09/16 0140 08/10/16 0303  WBC 14.1* 16.5*  HGB 12.2 11.2*  PLT 210 219    Recent Labs  08/09/16 0140 08/10/16 0303  NA 140 136  K 4.0 3.7  CL 100* 99*  CO2 31 28  GLUCOSE 107* 116*  BUN 24* 23*  CREATININE 0.95 1.04*    Recent Labs  08/08/16 1742 08/09/16 0140  TROPONINI 11.90* 9.78*    Recent Labs  08/09/16 0140  INR 1.19    Scheduled Meds: . arformoterol  15 mcg Nebulization BID  . aspirin EC  81 mg Oral Daily  . clopidogrel  75 mg Oral Daily  . famotidine  20 mg Oral Daily  . folic acid  0.5 mg Oral QHS  . metoprolol tartrate  12.5 mg Oral BID  . montelukast  10 mg Oral QHS  . oxybutynin  10 mg Oral QHS  . sertraline  50 mg Oral QHS  . simvastatin  40 mg Oral QHS  . umeclidinium bromide  1 puff Inhalation Daily   Continuous Infusions: . heparin 1,000 Units/hr (08/09/16 1911)   PRN Meds:.acetaminophen, ALPRAZolam, loratadine, nitroGLYCERIN, ondansetron (ZOFRAN) IV   Imaging: Dg Chest 2 View  Result Date: 08/08/2016 CLINICAL DATA:   Shortness of breath with exertion EXAM: CHEST  2 VIEW COMPARISON:  07/12/2016 FINDINGS: Cardiac shadow is enlarged but stable. Diffuse increased interstitial changes are noted likely representing some interstitial edema superimposed over more chronic fibrotic changes. No sizable effusion is noted. No focal confluent infiltrate is seen. IMPRESSION: Likely degree of interstitial edema superimposed over more chronic fibrotic changes. Electronically Signed   By: Inez Catalina M.D.   On: 08/08/2016 17:30    Assessment/Plan:  82 y/o female with carotid artery disease, CAD, HLD, and pulmonary fibrosis. Her cardiac issues date back to 83 when she had an MI. She was treated with a balloon angioplasty at that time to her RCA. She has been stable since then from a cardiac standpoint. Last stress myoview in October of 2010 showed inferior and inferolateral infarct but no significant ischemia. Ejection fraction was 60%. Renal dopplers 12/12 showed stable 1-59 bilateral renal artery stenosis. Patient had a right carotid endarterectomy in May 2015 and tolerated this well. The patient has dyspnea with more extreme activities but not with routine activities. She  lives alone and her daughter lives nearby. The pt told her daughter she didn't feel well yesterday. No specific complaints of dyspnea or chest pain. They saw a Wellness nurse and then sent to the ED. Her Troponin have been elevated- initially 11- down to 9. Her BNP was also elelvated 1200.    Principal Problem:   NSTEMI (non-ST elevated myocardial infarction) ([place]) Active Problems:   Acute CHF (HCC)-etiology not yet determined   CAD S/P percutaneous coronary angioplasty 1985   COPD mixed type ([place])   Essential hypertension   Carotid stenosis-s/p CEA 2015   PLAN: She is flat in bed- no SOB. Her SCr actually got better with diuresis. She is negative 2.2L after two doses of lasix 40 mg since admission. She may need more diuresis but she appears stable for  cath. She does not have a daily diuretic ordered. Basilar velcro crackles are chronic from pulmonary fibrosis, may be helpful to f/u BNP in am.    Kerin Ransom PA-C 08/10/2016, 8:49 AM [phone]

## 2016-08-10 NOTE — Progress Notes (Addendum)
Site area: RFA / RFV Site Prior to Removal:  Level 0 Pressure Applied For: 24 min Manual:   yes Patient Status During Pull:  stable Post Pull Site:  Level 0 Post Pull Instructions Given: yes  Post Pull Pulses Present: doppler Dressing Applied:  tegaderm Bedrest begins @ see LFA removal note Comments:

## 2016-08-11 DIAGNOSIS — E7800 Pure hypercholesterolemia, unspecified: Secondary | ICD-10-CM

## 2016-08-11 DIAGNOSIS — I6529 Occlusion and stenosis of unspecified carotid artery: Secondary | ICD-10-CM

## 2016-08-11 DIAGNOSIS — I5021 Acute systolic (congestive) heart failure: Secondary | ICD-10-CM

## 2016-08-11 LAB — CBC
HCT: 33.3 % — ABNORMAL LOW (ref 36.0–46.0)
Hemoglobin: 10.6 g/dL — ABNORMAL LOW (ref 12.0–15.0)
MCH: 30.8 pg (ref 26.0–34.0)
MCHC: 31.8 g/dL (ref 30.0–36.0)
MCV: 96.8 fL (ref 78.0–100.0)
PLATELETS: 224 10*3/uL (ref 150–400)
RBC: 3.44 MIL/uL — AB (ref 3.87–5.11)
RDW: 15.5 % (ref 11.5–15.5)
WBC: 13.8 10*3/uL — ABNORMAL HIGH (ref 4.0–10.5)

## 2016-08-11 LAB — BASIC METABOLIC PANEL
Anion gap: 7 (ref 5–15)
BUN: 17 mg/dL (ref 6–20)
CO2: 28 mmol/L (ref 22–32)
CREATININE: 0.81 mg/dL (ref 0.44–1.00)
Calcium: 8.2 mg/dL — ABNORMAL LOW (ref 8.9–10.3)
Chloride: 102 mmol/L (ref 101–111)
GFR calc Af Amer: >60 mL/min (ref >60)
Glucose, Bld: 98 mg/dL (ref 65–99)
POTASSIUM: 4.2 mmol/L (ref 3.5–5.1)
SODIUM: 137 mmol/L (ref 135–145)

## 2016-08-11 LAB — GLUCOSE, CAPILLARY
GLUCOSE-CAPILLARY: 123 mg/dL — AB (ref 65–99)
Glucose-Capillary: 96 mg/dL (ref 65–99)
Glucose-Capillary: 118 mg/dL — ABNORMAL HIGH (ref 65–99)

## 2016-08-11 LAB — BRAIN NATRIURETIC PEPTIDE: B Natriuretic Peptide: 532.9 pg/mL — ABNORMAL HIGH (ref 0.0–100.0)

## 2016-08-11 MED ORDER — FUROSEMIDE 10 MG/ML IJ SOLN
40.0000 mg | Freq: Once | INTRAMUSCULAR | Status: AC
Start: 2016-08-11 — End: 2016-08-11
  Administered 2016-08-11: 40 mg via INTRAVENOUS
  Filled 2016-08-11: qty 4

## 2016-08-11 NOTE — Progress Notes (Signed)
Patient Name: Linda Kelly Date of Encounter: 08/11/2016  Primary Cardiologist: [facility] Problem List     Principal Problem:   NSTEMI (non-ST elevated myocardial infarction) [facility]) Active Problems:   Essential hypertension   CAD S/P percutaneous coronary angioplasty 1985   COPD mixed type ([place])   Carotid stenosis-s/p CEA 2015   Acute CHF (HCC)-etiology not yet determined     Subjective   Feels weak. Denies chest pain and says breathing is a little better today.  Inpatient Medications    Scheduled Meds: . arformoterol  15 mcg Nebulization BID  . aspirin EC  81 mg Oral Daily  . clopidogrel  75 mg Oral Daily  . famotidine  20 mg Oral Daily  . folic acid  0.5 mg Oral QHS  . metoprolol tartrate  12.5 mg Oral BID  . montelukast  10 mg Oral QHS  . oxybutynin  10 mg Oral QHS  . sertraline  50 mg Oral QHS  . simvastatin  40 mg Oral QHS  . sodium chloride flush  3 mL Intravenous Q12H  . umeclidinium bromide  1 puff Inhalation Daily   Continuous Infusions:   PRN Meds: sodium chloride, acetaminophen, ALPRAZolam, loratadine, nitroGLYCERIN, ondansetron (ZOFRAN) IV, oxyCODONE-acetaminophen, sodium chloride flush   Vital Signs    Vitals:   08/11/16 0740 08/11/16 0847 08/11/16 0942 08/11/16 1300  BP:  116/62 140/90 116/64  Pulse:  88  82  Resp:    16  Temp:  98.7 F (37.1 C)  97.3 F (36.3 C)  TempSrc:  Oral  Oral  SpO2: 97% 95%  94%  Weight:      Height:        Intake/Output Summary (Last 24 hours) at 08/11/16 1338 Last data filed at 08/11/16 1300  Gross per 24 hour  Intake             1550 ml  Output             1550 ml  Net                0 ml   Filed Weights   08/09/16 0440 08/10/16 0555 08/11/16 0231  Weight: 145 lb 8 oz (66 kg) 141 lb 1.6 oz (64 kg) 143 lb 3.2 oz (65 kg)    Physical Exam    GEN: Well nourished, well developed, in no acute distress.  HEENT: Grossly normal.  Neck: Supple, no JVD, carotid bruits, or masses. Cardiac: RRR, no  murmurs, rubs, or gallops. No clubbing, cyanosis, edema.   Respiratory:  Velcro crackles 1/3 up b/l. GI: Soft, nontender, nondistended,. MS: no deformity or atrophy. Skin: warm and dry, no rash. Neuro:  Strength and sensation are intact. Psych: AAOx3.  Normal affect.  Labs    CBC  Recent Labs  08/08/16 1742  08/10/16 0303 08/11/16 0704  WBC 15.3*  < > 16.5* 13.8*  NEUTROABS 10.9*  --   --   --   HGB 11.2*  < > 11.2* 10.6*  HCT 33.9*  < > 34.7* 33.3*  MCV 94.4  < > 96.7 96.8  PLT 219  < > 219 224  < > = values in this interval not displayed. Basic Metabolic Panel  Recent Labs  08/10/16 0303 08/11/16 0704  NA 136 137  K 3.7 4.2  CL 99* 102  CO2 28 28  GLUCOSE 116* 98  BUN 23* 17  CREATININE 1.04* 0.81  CALCIUM 8.2* 8.2*   Liver Function Tests  Recent  Labs  08/08/16 1742  AST 40  ALT 13*  ALKPHOS 58  BILITOT 1.1  PROT 6.7  ALBUMIN 3.6   No results for input(s): LIPASE, AMYLASE in the last 72 hours. Cardiac Enzymes  Recent Labs  08/08/16 1742 08/09/16 0140  TROPONINI 11.90* 9.78*   BNP Invalid input(s): POCBNP D-Dimer No results for input(s): DDIMER in the last 72 hours. Hemoglobin A1C  Recent Labs  08/09/16 0140  HGBA1C 5.2   Fasting Lipid Panel  Recent Labs  08/09/16 0140  CHOL 163  HDL 42  LDLCALC 92  TRIG 147  CHOLHDL 3.9   Thyroid Function Tests No results for input(s): TSH, T4TOTAL, T3FREE, THYROIDAB in the last 72 hours.  Invalid input(s): FREET3  Telemetry     Personally Reviewed: NSR  ECG    Personally Reviewed  Radiology    No results found.  Cardiac Studies   Cath and echo results reviewed, EF 40-45% with multiple WMA's.  Patient Profile     82 y/o female with carotid artery disease, CAD, HLD, and pulmonary fibrosis. Her cardiac issues date back to 69 when she had an MI. She was treated with a balloon angioplasty at that time to her RCA. She has been stable since then from a cardiac standpoint. Last  stress myoview in October of 2010 showed inferior and inferolateral infarct but no significant ischemia. Ejection fraction was 60%. Renal dopplers 12/12 showed stable 1-59 bilateral renal artery stenosis. Patient had a right carotid endarterectomy in May 2015 and tolerated this well. The patient has dyspnea with more extreme activities but not with routine activities. She lives alone and her daughter lives nearby. The pt told her daughter she didn't feel well yesterday. No specific complaints of dyspnea or chest pain. They saw a Wellness nurse and then sent to the ED. Her Troponin have been elevated- initially 11- down to 9. Her BNP was also elelvated 1200, now down to 532.   Assessment & Plan    1. NSTEMI: Not an optimal PCI candidate. Will manage medically. Continue ASA, Plavix, metoprolol, and simvastatin.  2. Acute systolic CHF: Has grade 2 diastolic dysfunction with elevated filling pressures. Symptomatically improving. Will give an additional 40 mg IV Lasix. BNP now in 500 range from 1200 range.  3. HTN: BP normal.  4. Hyperlipidemia: Continue statin.  Signed, Kate Sable, MD  08/11/2016, 1:38 PM

## 2016-08-12 LAB — BASIC METABOLIC PANEL
Anion gap: 9 (ref 5–15)
BUN: 20 mg/dL (ref 6–20)
CHLORIDE: 98 mmol/L — AB (ref 101–111)
CO2: 29 mmol/L (ref 22–32)
CREATININE: 0.84 mg/dL (ref 0.44–1.00)
Calcium: 8.7 mg/dL — ABNORMAL LOW (ref 8.9–10.3)
GFR calc Af Amer: >60 mL/min (ref >60)
GFR calc non Af Amer: >60 mL/min (ref >60)
GLUCOSE: 118 mg/dL — AB (ref 65–99)
POTASSIUM: 3.5 mmol/L (ref 3.5–5.1)
Sodium: 136 mmol/L (ref 135–145)

## 2016-08-12 LAB — CBC
HCT: 34.7 % — ABNORMAL LOW (ref 36.0–46.0)
HEMOGLOBIN: 11.1 g/dL — AB (ref 12.0–15.0)
MCH: 30.5 pg (ref 26.0–34.0)
MCHC: 32 g/dL (ref 30.0–36.0)
MCV: 95.3 fL (ref 78.0–100.0)
Platelets: 231 10*3/uL (ref 150–400)
RBC: 3.64 MIL/uL — AB (ref 3.87–5.11)
RDW: 15.4 % (ref 11.5–15.5)
WBC: 15.6 10*3/uL — ABNORMAL HIGH (ref 4.0–10.5)

## 2016-08-12 MED ORDER — FUROSEMIDE 10 MG/ML IJ SOLN
40.0000 mg | Freq: Once | INTRAMUSCULAR | Status: AC
  Administered 2016-08-12: 40 mg via INTRAVENOUS
  Filled 2016-08-12: qty 4

## 2016-08-12 NOTE — Progress Notes (Signed)
SUBJECTIVE: Thinks breathing is better. Denies chest pain. Feels sleepy.   ROS: Other than pertinent positives in "Subjective", all others were reviewed and found to be negative.   Intake/Output Summary (Last 24 hours) at 08/12/16 1029 Last data filed at 08/11/16 2002  Gross per 24 hour  Intake              600 ml  Output              900 ml  Net             -300 ml    Current Facility-Administered Medications  Medication Dose Route Frequency Provider Last Rate Last Dose  . 0.9 %  sodium chloride infusion  250 mL Intravenous PRN Belva Crome, MD      . acetaminophen (TYLENOL) tablet 650 mg  650 mg Oral Q4H PRN Jettie Booze, MD   650 mg at 08/12/16 0934  . ALPRAZolam Duanne Moron) tablet 0.25 mg  0.25 mg Oral BID PRN Jettie Booze, MD   0.25 mg at 08/10/16 2050  . arformoterol (BROVANA) nebulizer solution 15 mcg  15 mcg Nebulization BID Lelon Perla, MD   15 mcg at 08/12/16 0740  . aspirin EC tablet 81 mg  81 mg Oral Daily Jettie Booze, MD   81 mg at 08/12/16 0934  . clopidogrel (PLAVIX) tablet 75 mg  75 mg Oral Daily Jettie Booze, MD   75 mg at 08/12/16 0934  . famotidine (PEPCID) tablet 20 mg  20 mg Oral Daily Jettie Booze, MD   20 mg at 08/12/16 0934  . folic acid (FOLVITE) tablet 0.5 mg  0.5 mg Oral QHS Jettie Booze, MD   0.5 mg at 08/11/16 2131  . loratadine (CLARITIN) tablet 10 mg  10 mg Oral Daily PRN Jettie Booze, MD      . metoprolol tartrate (LOPRESSOR) tablet 12.5 mg  12.5 mg Oral BID Jettie Booze, MD   12.5 mg at 08/12/16 0934  . montelukast (SINGULAIR) tablet 10 mg  10 mg Oral QHS Jettie Booze, MD   10 mg at 08/11/16 2130  . nitroGLYCERIN (NITROSTAT) SL tablet 0.4 mg  0.4 mg Sublingual Q5 Min x 3 PRN Jettie Booze, MD      . ondansetron [facility]) injection 4 mg  4 mg Intravenous Q6H PRN Jettie Booze, MD      . oxybutynin (DITROPAN-XL) 24 hr tablet 10 mg  10 mg Oral QHS Jettie Booze, MD    10 mg at 08/11/16 2131  . oxyCODONE-acetaminophen (PERCOCET/ROXICET) 5-325 MG per tablet 1-2 tablet  1-2 tablet Oral Q4H PRN Belva Crome, MD      . sertraline (ZOLOFT) tablet 50 mg  50 mg Oral QHS Jettie Booze, MD   50 mg at 08/11/16 2131  . simvastatin (ZOCOR) tablet 40 mg  40 mg Oral QHS Jettie Booze, MD   40 mg at 08/11/16 2131  . sodium chloride flush (NS) 0.9 % injection 3 mL  3 mL Intravenous Q12H Belva Crome, MD   3 mL at 08/12/16 0940  . sodium chloride flush (NS) 0.9 % injection 3 mL  3 mL Intravenous PRN Belva Crome, MD      . umeclidinium bromide (INCRUSE ELLIPTA) 62.5 MCG/INH 1 puff  1 puff Inhalation Daily Lelon Perla, MD   1 puff at 08/10/16 0801    Vitals:   08/11/16  2032 08/12/16 0629 08/12/16 0741 08/12/16 0934  BP: 96/66 107/64  123/72  Pulse: 81 80  86  Resp: 18 20    Temp: 98.5 F (36.9 C) 98.4 F (36.9 C)    TempSrc: Oral Oral    SpO2: 95% 98% 94%   Weight:      Height:        PHYSICAL EXAM GEN: Well nourished, well developed, in no acute distress.  HEENT: Grossly normal.  Neck: Supple, no JVD, carotid bruits, or masses. Cardiac: RRR, no murmurs, rubs, or gallops. No clubbing, cyanosis, edema.   Respiratory:  Velcro crackles 1/3 up b/l. GI: Soft, nontender, nondistended,. MS: no deformity or atrophy. Skin: warm and dry, no rash. Neuro:  Strength and sensation are intact. Psych: AAOx3.  Normal affect.  LABS: Basic Metabolic Panel:  Recent Labs  08/10/16 0303 08/11/16 0704  NA 136 137  K 3.7 4.2  CL 99* 102  CO2 28 28  GLUCOSE 116* 98  BUN 23* 17  CREATININE 1.04* 0.81  CALCIUM 8.2* 8.2*   Liver Function Tests: No results for input(s): AST, ALT, ALKPHOS, BILITOT, PROT, ALBUMIN in the last 72 hours. No results for input(s): LIPASE, AMYLASE in the last 72 hours. CBC:  Recent Labs  08/11/16 0704 08/12/16 0518  WBC 13.8* 15.6*  HGB 10.6* 11.1*  HCT 33.3* 34.7*  MCV 96.8 95.3  PLT 224 231   Cardiac Enzymes: No  results for input(s): CKTOTAL, CKMB, CKMBINDEX, TROPONINI in the last 72 hours. BNP: Invalid input(s): POCBNP D-Dimer: No results for input(s): DDIMER in the last 72 hours. Hemoglobin A1C: No results for input(s): HGBA1C in the last 72 hours. Fasting Lipid Panel: No results for input(s): CHOL, HDL, LDLCALC, TRIG, CHOLHDL, LDLDIRECT in the last 72 hours. Thyroid Function Tests: No results for input(s): TSH, T4TOTAL, T3FREE, THYROIDAB in the last 72 hours.  Invalid input(s): FREET3 Anemia Panel: No results for input(s): VITAMINB12, FOLATE, FERRITIN, TIBC, IRON, RETICCTPCT in the last 72 hours.  RADIOLOGY: Dg Chest 2 View  Result Date: 08/08/2016 CLINICAL DATA:  Shortness of breath with exertion EXAM: CHEST  2 VIEW COMPARISON:  07/12/2016 FINDINGS: Cardiac shadow is enlarged but stable. Diffuse increased interstitial changes are noted likely representing some interstitial edema superimposed over more chronic fibrotic changes. No sizable effusion is noted. No focal confluent infiltrate is seen. IMPRESSION: Likely degree of interstitial edema superimposed over more chronic fibrotic changes. Electronically Signed   By: Inez Catalina M.D.   On: 08/08/2016 17:30      ASSESSMENT AND PLAN: 1. NSTEMI: Not an optimal PCI candidate. Will manage medically. Continue ASA, Plavix, metoprolol, and simvastatin.  2. Acute systolic CHF: Has grade 2 diastolic dysfunction with elevated filling pressures. Symptomatically improving. Will give an additional 40 mg IV Lasix. BNP now in 500 range from 1200 range. Check BMET.  3. HTN: BP normal.  4. Hyperlipidemia: Continue statin.   Kate Sable, M.D., F.A.C.C.

## 2016-08-13 ENCOUNTER — Encounter (HOSPITAL_COMMUNITY): Payer: Self-pay | Admitting: Interventional Cardiology

## 2016-08-13 LAB — CBC
HEMATOCRIT: 33.7 % — AB (ref 36.0–46.0)
HEMOGLOBIN: 10.8 g/dL — AB (ref 12.0–15.0)
MCH: 30.9 pg (ref 26.0–34.0)
MCHC: 32 g/dL (ref 30.0–36.0)
MCV: 96.3 fL (ref 78.0–100.0)
Platelets: 232 10*3/uL (ref 150–400)
RBC: 3.5 MIL/uL — ABNORMAL LOW (ref 3.87–5.11)
RDW: 14.9 % (ref 11.5–15.5)
WBC: 15.1 10*3/uL — ABNORMAL HIGH (ref 4.0–10.5)

## 2016-08-13 MED ORDER — POTASSIUM CHLORIDE CRYS ER 20 MEQ PO TBCR
40.0000 meq | EXTENDED_RELEASE_TABLET | Freq: Once | ORAL | Status: AC
  Administered 2016-08-13: 40 meq via ORAL
  Filled 2016-08-13: qty 2

## 2016-08-13 MED ORDER — FUROSEMIDE 10 MG/ML IJ SOLN
40.0000 mg | Freq: Once | INTRAMUSCULAR | Status: AC
  Administered 2016-08-13: 40 mg via INTRAVENOUS
  Filled 2016-08-13: qty 4

## 2016-08-13 MED FILL — Verapamil HCl IV Soln 2.5 MG/ML: INTRAVENOUS | Qty: 2 | Status: AC

## 2016-08-13 NOTE — Progress Notes (Signed)
CARDIAC REHAB PHASE I   PRE:  Rate/Rhythm: 89 SR  BP:  Supine:   Sitting: 123/71  Standing:    SaO2: 96% 4L , 88%RA probe on toe. Put probe on forehead and sats 100%RA  MODE:  Ambulation: 200 ft   POST:  Rate/Rhythm: 102 ST  BP:  Supine:   Sitting: 95/67  Standing:    SaO2: 93-1005 RA whole walk but sat probe on forehead 0955-1043 Put oxygen sat probe on forehead to walk as it was on her toe. Sats much better on forehead and good wave form. Pt's response to activity hard to determine as she seems very tired and slow to answer. She denied feeling very SOB but c/o being tired. Had been asleep when I entered room. To chair and then pt asked to go back to bed. Left off oxygen and notified RN. Was at 100%RA lying in bed. BP lower after walk and she did state she felt a little lightheaded when asked.  Would recommend PT.   Graylon Good, RN BSN  08/13/2016 10:37 AM

## 2016-08-13 NOTE — Care Management Important Message (Signed)
Important Message  Patient Details  Name: ATLANTA JANES MRN: [id number] Date of Birth: 1928/06/28   Medicare Important Message Given:  Yes    Shambria Camerer Abena 08/13/2016, 1:47 PM

## 2016-08-13 NOTE — Progress Notes (Signed)
Patient Name: Linda Kelly Date of Encounter: 08/13/2016  Primary Cardiologist: Dr. Talmadge Chad Problem List     Principal Problem:   NSTEMI (non-ST elevated myocardial infarction) [facility]) Active Problems:   Essential hypertension   CAD S/P percutaneous coronary angioplasty 1985   COPD mixed type ([place])   Carotid stenosis-s/p CEA 2015   Acute CHF (HCC)-etiology not yet determined    Patient Profile     82 year old female with h/o CAD with a history of MI 37 years ago. Also with idopathic pulmonary fibrosis, not on home O2, who presented to [facility] with CC of increased dyspnea and fatigue. Ruled in for NSTEMI.   Subjective   She denies CP. Still on supplemental O2 (not on home O2). She has not ambulated much since being admitted. Main complaint today is constant fatigue.   Inpatient Medications    Scheduled Meds: . arformoterol  15 mcg Nebulization BID  . aspirin EC  81 mg Oral Daily  . clopidogrel  75 mg Oral Daily  . famotidine  20 mg Oral Daily  . folic acid  0.5 mg Oral QHS  . metoprolol tartrate  12.5 mg Oral BID  . montelukast  10 mg Oral QHS  . oxybutynin  10 mg Oral QHS  . sertraline  50 mg Oral QHS  . simvastatin  40 mg Oral QHS  . sodium chloride flush  3 mL Intravenous Q12H  . umeclidinium bromide  1 puff Inhalation Daily   Continuous Infusions:   PRN Meds: sodium chloride, acetaminophen, ALPRAZolam, loratadine, nitroGLYCERIN, ondansetron (ZOFRAN) IV, oxyCODONE-acetaminophen, sodium chloride flush   Vital Signs    Vitals:   08/12/16 2130 08/12/16 2140 08/13/16 0454 08/13/16 0649  BP: 118/66   106/60  Pulse: 85 91  86  Resp: 16   (!) 21  Temp: 98.6 F (37 C)  98.4 F (36.9 C)   TempSrc:   Oral   SpO2: 100%   100%  Weight:   139 lb (63 kg)   Height:        Intake/Output Summary (Last 24 hours) at 08/13/16 0852 Last data filed at 08/13/16 0454  Gross per 24 hour  Intake              365 ml  Output             1060 ml  Net             -695  ml   Filed Weights   08/10/16 0555 08/11/16 0231 08/13/16 0454  Weight: 141 lb 1.6 oz (64 kg) 143 lb 3.2 oz (65 kg) 139 lb (63 kg)    Physical Exam   GEN: Well nourished, well developed, in no acute distress, elderly HEENT: Grossly normal.  Neck: Supple, no JVD, carotid bruits, or masses. Cardiac: RRR, no murmurs, rubs, or gallops. No clubbing, cyanosis, edema.  Radials/DP/PT 2+ and equal bilaterally.  Respiratory:  Respirations regular and unlabored, bilateral crackles at the bases c/w pulmonary fibrosis GI: Soft, nontender, nondistended, BS + x 4. MS: no deformity or atrophy. Skin: warm and dry, no rash. Neuro:  Strength and sensation are intact. Psych: AAOx3.  Normal affect.  Labs    CBC  Recent Labs  08/12/16 0518 08/13/16 0513  WBC 15.6* 15.1*  HGB 11.1* 10.8*  HCT 34.7* 33.7*  MCV 95.3 96.3  PLT 231 [id number]   Basic Metabolic Panel  Recent Labs  08/11/16 0704 08/12/16 1042  NA 137 136  K 4.2 3.5  CL 102 98*  CO2 28 29  GLUCOSE 98 118*  BUN 17 20  CREATININE 0.81 0.84  CALCIUM 8.2* 8.7*   Liver Function Tests No results for input(s): AST, ALT, ALKPHOS, BILITOT, PROT, ALBUMIN in the last 72 hours. No results for input(s): LIPASE, AMYLASE in the last 72 hours. Cardiac Enzymes No results for input(s): CKTOTAL, CKMB, CKMBINDEX, TROPONINI in the last 72 hours. BNP Invalid input(s): POCBNP D-Dimer No results for input(s): DDIMER in the last 72 hours. Hemoglobin A1C No results for input(s): HGBA1C in the last 72 hours. Fasting Lipid Panel No results for input(s): CHOL, HDL, LDLCALC, TRIG, CHOLHDL, LDLDIRECT in the last 72 hours. Thyroid Function Tests No results for input(s): TSH, T4TOTAL, T3FREE, THYROIDAB in the last 72 hours.  Invalid input(s): FREET3  Telemetry    NSR - Personally Reviewed  ECG    08/08/16 NSR- Personally Reviewed  Radiology    No results found.  Cardiac Studies   LHC 08/10/16 Conclusion    Complicated procedure that  demonstrated high-grade obstruction in the right iliac artery preventing the procedure from being completed and requiring crossover to the left femoral artery.  Inability to advance the pulmonary artery catheter into the pulmonary artery or capillary wedge position. RV systolic pressure is mildly to moderately elevated.  Severe multivessel coronary disease with chronic total occlusion of the mid RCA collateralized by right to right and left-to-right sources.  Total occlusion of the mid LAD beyond a large first diagonal. Left left collaterals are noted.  99% stenosis in the mid to distal native circumflex beyond a very tortuous segment. PCI was not attempted. Distal territory is relatively small.  Inferior wall severe hypokinesis involving the basal to mid segment. Anterior and apical mild hypokinesis is noted. Ejection fraction is estimated to be 35-45%. Presentation was that of acute on chronic combined systolic and diastolic heart failure.    2D Echo 08/10/16 Study Conclusions  - Left ventricle: The cavity size was normal. Wall thickness was   normal. Systolic function was mildly to moderately reduced. The   estimated ejection fraction was in the range of 40% to 45%.   Moderate hypokinesis of the basal-midanterolateral and   inferolateral myocardium; consistent with ischemia in the   distribution of the left circumflex coronary artery. Features are   consistent with a pseudonormal left ventricular filling pattern,   with concomitant abnormal relaxation and increased filling   pressure (grade 2 diastolic dysfunction). - Mitral valve: Moderately calcified annulus. There was mild to   moderate regurgitation directed centrally. - Left atrium: The atrium was mildly dilated.   Patient Profile     82 year old female with h/o CAD with a history of MI 37 years ago. Also with idiopathic pulmonary fibrosis, not on home O2, who presented to [facility] with CC of increased dyspnea and fatigue. Ruled  in for NSTEMI.   Assessment & Plan    1. NSTEMI: Not an optimal PCI candidate. Will manage medically. Continue ASA, Plavix, metoprolol, and simvastatin. She is CP free. Ambulate with cardiac rehab today.   2. Acute systolic CHF: EF [id number]. She also has grade 2 diastolic dysfunction with elevated filling pressures. She was treated with IV lasix. Symptomatically improving, but still requiring supplemental O2 (also with pulmonary fibrosis). No peripheral edema. Ambulate with cardiac rehab today. Continue BB for BP control and LVF. BP is too soft for ACE-I. Can add as an outpatient. May need PRN Lasix at time of d/c, based on daily weights at home. Low sodium  diet.   3. HTN: BP normal. Soft but stable. Continue low dose BB. Consider adding low dose ACE-I as an OP for systolic dysfunction, if BP allows.   4. Hyperlipidemia: Continue statin.  5. Idiopathic Pulmonary Fibrosis: was not on home O2 prior to admit. Now on supplemental O2. Breathing treatments per respiratory therapy. Will have patient ambulate with cardiac rehab today. Monitor O2 sats while ambulating. Test how patient does on RA. She will continue to f/u with her primary pulmonologist as an outpatient.   Signed, Lyda Jester, PA-C  08/13/2016, 8:52 AM   I have examined the patient and reviewed assessment and plan and discussed with patient.  Agree with above as stated.  Will give one dose of Lasix and potassium.  Wean oxygen. PT consult.  Larae Grooms

## 2016-08-14 ENCOUNTER — Telehealth: Payer: Self-pay | Admitting: Cardiology

## 2016-08-14 DIAGNOSIS — J84112 Idiopathic pulmonary fibrosis: Secondary | ICD-10-CM

## 2016-08-14 DIAGNOSIS — I509 Heart failure, unspecified: Secondary | ICD-10-CM

## 2016-08-14 DIAGNOSIS — J8410 Pulmonary fibrosis, unspecified: Secondary | ICD-10-CM

## 2016-08-14 DIAGNOSIS — I5042 Chronic combined systolic (congestive) and diastolic (congestive) heart failure: Secondary | ICD-10-CM

## 2016-08-14 LAB — BASIC METABOLIC PANEL
Anion gap: 9 (ref 5–15)
BUN: 24 mg/dL — AB (ref 6–20)
CALCIUM: 9 mg/dL (ref 8.9–10.3)
CO2: 32 mmol/L (ref 22–32)
CREATININE: 1.02 mg/dL — AB (ref 0.44–1.00)
Chloride: 96 mmol/L — ABNORMAL LOW (ref 101–111)
GFR calc Af Amer: 56 mL/min — ABNORMAL LOW (ref >60)
GFR, EST NON AFRICAN AMERICAN: 49 mL/min — AB (ref >60)
GLUCOSE: 109 mg/dL — AB (ref 65–99)
Potassium: 4.7 mmol/L (ref 3.5–5.1)
SODIUM: 137 mmol/L (ref 135–145)

## 2016-08-14 LAB — CBC
HCT: 33.6 % — ABNORMAL LOW (ref 36.0–46.0)
Hemoglobin: 10.8 g/dL — ABNORMAL LOW (ref 12.0–15.0)
MCH: 30.9 pg (ref 26.0–34.0)
MCHC: 32.1 g/dL (ref 30.0–36.0)
MCV: 96.3 fL (ref 78.0–100.0)
PLATELETS: 235 10*3/uL (ref 150–400)
RBC: 3.49 MIL/uL — AB (ref 3.87–5.11)
RDW: 15 % (ref 11.5–15.5)
WBC: 12.7 10*3/uL — ABNORMAL HIGH (ref 4.0–10.5)

## 2016-08-14 MED ORDER — METOPROLOL TARTRATE 25 MG PO TABS: 12.5000 mg | ORAL_TABLET | Freq: Two times a day (BID) | ORAL | 6 refills | Status: DC

## 2016-08-14 MED ORDER — NITROGLYCERIN 0.4 MG SL SUBL: 0.4000 mg | SUBLINGUAL_TABLET | SUBLINGUAL | 3 refills | Status: DC | PRN

## 2016-08-14 MED ORDER — FUROSEMIDE 40 MG PO TABS: 40.0000 mg | ORAL_TABLET | Freq: Every day | ORAL | 6 refills | Status: DC

## 2016-08-14 MED ORDER — POTASSIUM CHLORIDE ER 20 MEQ PO TBCR: 10.0000 meq | EXTENDED_RELEASE_TABLET | Freq: Every day | ORAL | 6 refills | Status: DC

## 2016-08-14 NOTE — Progress Notes (Signed)
CARDIAC REHAB PHASE I   PRE:  Rate/Rhythm: 83 SR  BP:  Supine:   Sitting: 119/64  Standing:    SaO2: 95%RA  MODE:  Ambulation: 200 ft   POST:  Rate/Rhythm: 92  BP:  Supine: 110/65  Sitting:   Standing:    SaO2: 96%RA 1038-1120 Pt walked 200 ft on RA with rolling walker and asst x 2 with steady gait. Asst x 2 so we could monitor sats whole walk. Did well on RA. Daughter stated that pt has access to walker for home. Will let PT evaluate if pt needs home therapy. MI and CHF ed completed with pt and daughter who voiced understanding. Reviewed signs and symptoms of CHF and when to call MD. Encouraged daily weights and 2000 mg sodium restriction. Reviewed NTG use and encouraged walking as tolerated. Pt mainly walks in house. Discussed CRP 2 and will refer to [facility] as pt considers. Does water aerobics three times a week and might just do this for ex. Discussed not doing water aerobics until cardiologist gives ok. Gave low sodium diets, CHF and MI booklets for review.   Graylon Good, RN BSN  08/14/2016 11:17 AM

## 2016-08-14 NOTE — Plan of Care (Signed)
Problem: Education: Goal: Knowledge of [facility] Education information/materials will improve Outcome: Completed/Met Date Met: 08/14/16 Pt educated throughout entire admission regarding tests, procedures, medications, labs, and available resources   Problem: Safety: Goal: Ability to remain free from injury will improve Outcome: Completed/Met Date Met: 08/14/16 Pt has remained free from injury during this admission   Problem: Discharge Progression Outcomes Goal: No anginal pain Outcome: Completed/Met Date Met: 08/14/16 Pt remains chest pain free Goal: Vascular site scale level 0 - I Vascular Site Scale Level 0: No bruising/bleeding/hematoma Level I (Mild): Bruising/Ecchymosis, minimal bleeding/ooozing, palpable hematoma < 3 cm Level II (Moderate): Bleeding not affecting hemodynamic parameters, pseudoaneurysm, palpable hematoma > 3 cm Level III  (Severe) Bleeding which affects hemodynamic parameters or retroperitoneal hemorrhage  Outcome: Completed/Met Date Met: 08/14/16 Radial and groin sites level 0 Goal: Tolerates diet Outcome: Completed/Met Date Met: 08/14/16 Pt able to tolerate heart healthy diet with no difficulties

## 2016-08-14 NOTE — Progress Notes (Signed)
Patient Name: Linda Kelly Date of Encounter: 08/14/2016  Primary Cardiologist: Dr. Talmadge Chad Problem List     Principal Problem:   NSTEMI (non-ST elevated myocardial infarction) [facility]) Active Problems:   Essential hypertension   CAD S/P percutaneous coronary angioplasty 1985   COPD mixed type ([place])   Carotid stenosis-s/p CEA 2015   Acute CHF (HCC)-etiology not yet determined    Patient Profile     82 year old female with h/o CAD with a history of MI 37 years ago. Also with idopathic pulmonary fibrosis, not on home O2, who presented to [facility] with CC of increased dyspnea and fatigue. Ruled in for NSTEMI.   Subjective   No chest pain this morning. Had oxygen on but not in her nose this morning. Sats-95%. States she felt well walking with cardiac rehab yesterday.    Inpatient Medications    Scheduled Meds: . arformoterol  15 mcg Nebulization BID  . aspirin EC  81 mg Oral Daily  . clopidogrel  75 mg Oral Daily  . famotidine  20 mg Oral Daily  . folic acid  0.5 mg Oral QHS  . metoprolol tartrate  12.5 mg Oral BID  . montelukast  10 mg Oral QHS  . oxybutynin  10 mg Oral QHS  . sertraline  50 mg Oral QHS  . simvastatin  40 mg Oral QHS  . sodium chloride flush  3 mL Intravenous Q12H  . umeclidinium bromide  1 puff Inhalation Daily   Continuous Infusions:   PRN Meds: sodium chloride, acetaminophen, ALPRAZolam, loratadine, nitroGLYCERIN, ondansetron (ZOFRAN) IV, oxyCODONE-acetaminophen, sodium chloride flush   Vital Signs    Vitals:   08/13/16 1849 08/13/16 2027 08/13/16 2034 08/14/16 0432  BP: 115/70 106/62  116/62  Pulse: 86 (!) 101    Resp: (!) 23 (!) 34  (!) 29  Temp:  97.8 F (36.6 C)  97.5 F (36.4 C)  TempSrc:  Axillary  Oral  SpO2: 97% 94% 94% 100%  Weight:    137 lb 11.2 oz (62.5 kg)  Height:        Intake/Output Summary (Last 24 hours) at 08/14/16 0811 Last data filed at 08/13/16 2200  Gross per 24 hour  Intake              480 ml  Output               475 ml  Net                5 ml   Filed Weights   08/11/16 0231 08/13/16 0454 08/14/16 0432  Weight: 143 lb 3.2 oz (65 kg) 139 lb (63 kg) 137 lb 11.2 oz (62.5 kg)    Physical Exam   GEN: Well nourished, well developed, in no acute distress, elderly HEENT: Grossly normal.  Neck: Supple, no JVD, carotid bruits, or masses. Cardiac: RRR, no murmurs, rubs, or gallops. No clubbing, cyanosis, edema.  Radials/DP/PT 2+ and equal bilaterally.  Respiratory:  Respirations regular and unlabored, bilateral crackles at the bases c/w pulmonary fibrosis GI: Soft, nontender, nondistended, BS + x 4. MS: no deformity or atrophy. Skin: warm and dry, no rash. Neuro:  Strength and sensation are intact. Psych: AAOx3.  Normal affect.  Labs    CBC  Recent Labs  08/13/16 0513 08/14/16 0353  WBC 15.1* 12.7*  HGB 10.8* 10.8*  HCT 33.7* 33.6*  MCV 96.3 96.3  PLT 232 [id number]   Basic Metabolic Panel  Recent Labs  08/12/16 1042  08/14/16 0353  NA 136 137  K 3.5 4.7  CL 98* 96*  CO2 29 32  GLUCOSE 118* 109*  BUN 20 24*  CREATININE 0.84 1.02*  CALCIUM 8.7* 9.0   Liver Function Tests No results for input(s): AST, ALT, ALKPHOS, BILITOT, PROT, ALBUMIN in the last 72 hours. No results for input(s): LIPASE, AMYLASE in the last 72 hours. Cardiac Enzymes No results for input(s): CKTOTAL, CKMB, CKMBINDEX, TROPONINI in the last 72 hours. BNP Invalid input(s): POCBNP D-Dimer No results for input(s): DDIMER in the last 72 hours. Hemoglobin A1C No results for input(s): HGBA1C in the last 72 hours. Fasting Lipid Panel No results for input(s): CHOL, HDL, LDLCALC, TRIG, CHOLHDL, LDLDIRECT in the last 72 hours. Thyroid Function Tests No results for input(s): TSH, T4TOTAL, T3FREE, THYROIDAB in the last 72 hours.  Invalid input(s): FREET3  Telemetry    NSR - Personally Reviewed  ECG    08/13/16 NSR- Personally Reviewed  Radiology    No results found.  Cardiac Studies   LHC  08/10/16 Conclusion    Complicated procedure that demonstrated high-grade obstruction in the right iliac artery preventing the procedure from being completed and requiring crossover to the left femoral artery.  Inability to advance the pulmonary artery catheter into the pulmonary artery or capillary wedge position. RV systolic pressure is mildly to moderately elevated.  Severe multivessel coronary disease with chronic total occlusion of the mid RCA collateralized by right to right and left-to-right sources.  Total occlusion of the mid LAD beyond a large first diagonal. Left left collaterals are noted.  99% stenosis in the mid to distal native circumflex beyond a very tortuous segment. PCI was not attempted. Distal territory is relatively small.  Inferior wall severe hypokinesis involving the basal to mid segment. Anterior and apical mild hypokinesis is noted. Ejection fraction is estimated to be 35-45%. Presentation was that of acute on chronic combined systolic and diastolic heart failure.    2D Echo 08/10/16 Study Conclusions  - Left ventricle: The cavity size was normal. Wall thickness was   normal. Systolic function was mildly to moderately reduced. The   estimated ejection fraction was in the range of 40% to 45%.   Moderate hypokinesis of the basal-midanterolateral and   inferolateral myocardium; consistent with ischemia in the   distribution of the left circumflex coronary artery. Features are   consistent with a pseudonormal left ventricular filling pattern,   with concomitant abnormal relaxation and increased filling   pressure (grade 2 diastolic dysfunction). - Mitral valve: Moderately calcified annulus. There was mild to   moderate regurgitation directed centrally. - Left atrium: The atrium was mildly dilated.   Patient Profile     82 year old female with h/o CAD with a history of MI 37 years ago. Also with idiopathic pulmonary fibrosis, not on home O2, who presented to  [facility] with CC of increased dyspnea and fatigue. Ruled in for NSTEMI.   Assessment & Plan    1. NSTEMI: Not an optimal PCI candidate. Will manage medically. Continue ASA, Plavix, metoprolol, and simvastatin. She is CP free. Ambulated with cardiac rehab yesterday and maintained O2 sats, states she felt well. PT ordered yesterday.  2. Acute systolic CHF: EF [id number]. She also has grade 2 diastolic dysfunction with elevated filling pressures. She was treated with IV lasix. Symptomatically improved. No peripheral edema. Ambulated with cardiac rehab yesterday and maintained sats on RA. Continue BB for BP control and LVF. BP is too soft for ACE-I. Can add as an  outpatient. Received a dose of IV lasix yesterday. May need PRN Lasix at time of d/c, based on daily weights at home. Low sodium diet.   3. HTN: BP normal. Soft but stable. Continue low dose BB. Consider adding low dose ACE-I as an OP for systolic dysfunction, if BP allows.   4. Hyperlipidemia: Continue statin.  5. Idiopathic Pulmonary Fibrosis: was not on home O2 prior to admit. Initially required supplemental O2, but maintaining sats without this morning. Breathing treatments per respiratory therapy. She will continue to f/u with her primary pulmonologist as an outpatient.   Signed, Reino Bellis, NP-C 08/14/2016, 8:11 AM   I have examined the patient and reviewed assessment and plan and discussed with patient.  Agree with above as stated.  Needs PT eval per cardiac rehab.  Now on RA.  If she is able to ambulate, will plan on d/c to home.  Oxygen sats have been low during sleep (83% this AM) but quickly come back to normal range when she wakes.  Will check with pulmonary if she needs nighttime oxygen.    Larae Grooms

## 2016-08-14 NOTE — Discharge Summary (Addendum)
Discharge Summary    Patient ID: Linda Kelly,  MRN: [id number], DOB/AGE: Aug 20, 1928 82 y.o.  Admit date: 08/08/2016 Discharge date: 08/14/2016  Primary Care Provider: Binnie Rail Primary Cardiologist: Dr. Stanford Breed  Discharge Diagnoses    Principal Problem:   NSTEMI (non-ST elevated myocardial infarction) [facility]) Active Problems:   Essential hypertension   CAD S/P percutaneous coronary angioplasty 1985   COPD mixed type ([place])   Carotid stenosis-s/p CEA 2015   Acute CHF (HCC)-etiology not yet determined   Allergies No Known Allergies  Diagnostic Studies/Procedures    LHC: 10/13  Conclusion    Complicated procedure that demonstrated high-grade obstruction in the right iliac artery preventing the procedure from being completed and requiring crossover to the left femoral artery.  Inability to advance the pulmonary artery catheter into the pulmonary artery or capillary wedge position. RV systolic pressure is mildly to moderately elevated.  Severe multivessel coronary disease with chronic total occlusion of the mid RCA collateralized by right to right and left-to-right sources.  Total occlusion of the mid LAD beyond a large first diagonal. Left left collaterals are noted.  99% stenosis in the mid to distal native circumflex beyond a very tortuous segment. PCI was not attempted. Distal territory is relatively small.  Inferior wall severe hypokinesis involving the basal to mid segment. Anterior and apical mild hypokinesis is noted. Ejection fraction is estimated to be 35-45%. Presentation was that of acute on chronic combined systolic and diastolic heart failure.  RECOMMENDATIONS:   Medical therapy with optimization of heart failure regimen.  Antiplatelet therapy.  Given the patient's age, I do not believe surgical revascularization is warranted/prudent.  Only PCI target would be the mid circumflex, which I do not believe would change her clinical status (not  having ischemic pain or evidence of ongoing infarction). There would be relatively high risk of inability to deliver stent and/or primary failure due to tortuosity.   _____________   History of Present Illness     Linda Kelly is a 82 yo female with PMH of  CAD ('85), HLD, pulmonary fibrosis who presented to the ED c/o increased dyspnea and fatigue on 10/11.She was treated with a balloon angioplasty in 1985 to her RCA. She has been stable since then from a cardiac standpoint. Last stress myoview in October of 2010 showed inferior and inferolateral infarct but no significant ischemia.  Last EF in 2010 was 60%. In the ED she received IV lasix and breathing improved. Labs showed elevated trop and was started on IV heparin. She was started on BB and made NPO with plans to cardiac catheterization the following day.   Hospital Course     Consultants: None  She was continued on IV lasix the following day and cath delayed to allow for further diuresis. Echo showed LVEF 40-45% (reduced from 60% in 2010) with moderate hypokinesis of the basal and mid anterolateral and inferolateral myocardium with Grade 2 DD and elevated LV filling pressure. Trop peaked at 11.90. BNP on admission improved from 1224>>532 with diuresis.   On 10/13 she underwent R/LHC with Dr. Tamala Julian showing severe multivessel CAD with chronic total occlusion of mid RCA, total occlusion of mid LAD with left-left collaterals, and 99% of mid to distal native circumflex. EF was estimated at 35-45%. Not considered a candidate for PCI and opted for medical therapy (ASA, Plavix , Metoprolol and simvastatin). She was continued on IV lasix as her dyspnea improved but still persisted.   Weight this admission decreased from  145lbs>>137lbs, with total net UOP of -2.6L. She was able to weaned from supplemental 02 while awake, but sats were noted in the mid 80s while resting. Plan to discharge with home O2 at night. Was seen and evaluated by PT and no home health  PT needs noted. Of note her blood pressure remained to soft for the addition of ACEi this admission, consider in the outpatient setting.   She was seen and assessed by Dr. Irish Lack on 10/17 and determined stable for discharge home. I have arranged for follow up in the office within 1 week. She will also be discharged home on 40mg  lasix daily and instructed to weigh daily to assess her fluid volume.  _____________  Discharge Vitals Blood pressure (!) 114/57, pulse 90, temperature 98 F (36.7 C), temperature source Oral, resp. rate 18, height 4\' 10"  (1.473 m), weight 137 lb 11.2 oz (62.5 kg), SpO2 92 %.  Filed Weights   08/11/16 0231 08/13/16 0454 08/14/16 0432  Weight: 143 lb 3.2 oz (65 kg) 139 lb (63 kg) 137 lb 11.2 oz (62.5 kg)    Labs & Radiologic Studies    CBC  Recent Labs  08/13/16 0513 08/14/16 0353  WBC 15.1* 12.7*  HGB 10.8* 10.8*  HCT 33.7* 33.6*  MCV 96.3 96.3  PLT 232 [id number]   Basic Metabolic Panel  Recent Labs  08/12/16 1042 08/14/16 0353  NA 136 137  K 3.5 4.7  CL 98* 96*  CO2 29 32  GLUCOSE 118* 109*  BUN 20 24*  CREATININE 0.84 1.02*  CALCIUM 8.7* 9.0   Liver Function Tests No results for input(s): AST, ALT, ALKPHOS, BILITOT, PROT, ALBUMIN in the last 72 hours. No results for input(s): LIPASE, AMYLASE in the last 72 hours. Cardiac Enzymes No results for input(s): CKTOTAL, CKMB, CKMBINDEX, TROPONINI in the last 72 hours. BNP Invalid input(s): POCBNP D-Dimer No results for input(s): DDIMER in the last 72 hours. Hemoglobin A1C No results for input(s): HGBA1C in the last 72 hours. Fasting Lipid Panel No results for input(s): CHOL, HDL, LDLCALC, TRIG, CHOLHDL, LDLDIRECT in the last 72 hours. Thyroid Function Tests No results for input(s): TSH, T4TOTAL, T3FREE, THYROIDAB in the last 72 hours.  Invalid input(s): FREET3 _____________  Dg Chest 2 View  Result Date: 08/08/2016 CLINICAL DATA:  Shortness of breath with exertion EXAM: CHEST  2 VIEW  COMPARISON:  07/12/2016 FINDINGS: Cardiac shadow is enlarged but stable. Diffuse increased interstitial changes are noted likely representing some interstitial edema superimposed over more chronic fibrotic changes. No sizable effusion is noted. No focal confluent infiltrate is seen. IMPRESSION: Likely degree of interstitial edema superimposed over more chronic fibrotic changes. Electronically Signed   By: Inez Catalina M.D.   On: 08/08/2016 17:30   Disposition   Pt is being discharged home today in good condition.  Follow-up [place] .   Why:  Oxygen Contact information: [place] [address] [place], [place] Follow up on 08/22/2016.   Specialties:  Cardiology, Radiology Why:  at 10am for your hospital follow up with Dr. Lonia Skinner PA [facility] - Inpatient information: [address] [place] [address] [phone]          Discharge Instructions    ([place]) Call MD:  Anytime you have any of the following symptoms: 1) 3 pound weight gain in 24 hours or 5  pounds in 1 week 2) shortness of breath, with or without a dry hacking cough 3) swelling in the hands, feet or stomach 4) if you have to sleep on extra pillows at night in order to breathe.    Complete by:  As directed    Amb Referral to Cardiac Rehabilitation    Complete by:  As directed    Diagnosis:  NSTEMI   Diet - low sodium heart healthy    Complete by:  As directed    Discharge instructions    Complete by:  As directed    Groin Site Care Refer to this sheet in the next few weeks. These instructions provide you with information on caring for yourself after your procedure. Your caregiver may also give you more specific instructions. Your treatment has been planned according to current medical practices, but problems sometimes occur. Call your caregiver if you have any problems or questions  after your procedure. HOME CARE INSTRUCTIONS You may shower 24 hours after the procedure. Remove the bandage (dressing) and gently wash the site with plain soap and water. Gently pat the site dry.  Do not apply powder or lotion to the site.  Do not sit in a bathtub, swimming pool, or whirlpool for 5 to 7 days.  No bending, squatting, or lifting anything over 10 pounds (4.5 kg) as directed by your caregiver.  Inspect the site at least twice daily.  Do not drive home if you are discharged the same day of the procedure. Have someone else drive you.  You may drive 24 hours after the procedure unless otherwise instructed by your caregiver.  What to expect: Any bruising will usually fade within 1 to 2 weeks.  Blood that collects in the tissue (hematoma) may be painful to the touch. It should usually decrease in size and tenderness within 1 to 2 weeks.  SEEK IMMEDIATE MEDICAL CARE IF: You have unusual pain at the groin site or down the affected leg.  You have redness, warmth, swelling, or pain at the groin site.  You have drainage (other than a small amount of blood on the dressing).  You have chills.  You have a fever or persistent symptoms for more than 72 hours.  You have a fever and your symptoms suddenly get worse.  Your leg becomes pale, cool, tingly, or numb.  You have heavy bleeding from the site. Hold pressure on the site. .   For home use only DME oxygen    Complete by:  As directed    Mode or (Route):  Nasal cannula   Liters per Minute:  2   Frequency:  Only at night (stationary unit needed)   Oxygen delivery system:  Gas   Increase activity slowly    Complete by:  As directed       Discharge Medications   Current Discharge Medication List    START taking these medications   Details  furosemide (LASIX) 40 MG tablet Take 1 tablet (40 mg total) by mouth daily. Qty: 30 tablet, Refills: 6    metoprolol tartrate (LOPRESSOR) 25 MG tablet Take 0.5 tablets (12.5 mg total) by  mouth 2 (two) times daily. Qty: 60 tablet, Refills: 6    nitroGLYCERIN (NITROSTAT) 0.4 MG SL tablet Place 1 tablet (0.4 mg total) under the tongue every 5 (five) minutes x 3 doses as needed for chest pain. Qty: 25 tablet, Refills: 3    potassium chloride 20 MEQ TBCR Take 10 mEq by mouth daily. Qty: 30 tablet,  Refills: 6      CONTINUE these medications which have NOT CHANGED   Details  aspirin EC 81 MG tablet Take 81 mg by mouth at bedtime.     cholecalciferol (VITAMIN D) 1000 UNITS tablet Take 1,000 Units by mouth at bedtime.     clopidogrel (PLAVIX) 75 MG tablet Take 1 tablet (75 mg total) by mouth daily. Qty: 90 tablet, Refills: 1    folic acid (FOLVITE) A999333 MCG tablet Take 400 mcg by mouth at bedtime.     loratadine (CLARITIN) 10 MG tablet Take 10 mg by mouth daily as needed for allergies.    montelukast (SINGULAIR) 10 MG tablet Take 1 tablet (10 mg total) by mouth at bedtime. Qty: 90 tablet, Refills: 3   Associated Diagnoses: Allergic rhinitis, unspecified allergic rhinitis type    Omega-3 Fatty Acids (FISH OIL) 1000 MG CAPS Take 1,000 mg by mouth at bedtime.     ranitidine (ZANTAC) 150 MG tablet TAKE 1 TABLET BY MOUTH TWICE DAILY Qty: 180 tablet, Refills: 3    sertraline (ZOLOFT) 50 MG tablet Take 1 tablet (50 mg total) by mouth daily. Qty: 90 tablet, Refills: 3    simvastatin (ZOCOR) 40 MG tablet Take 1 tablet (40 mg total) by mouth daily. Qty: 90 tablet, Refills: 3    tolterodine (DETROL) 2 MG tablet Take 1 tablet (2 mg total) by mouth at bedtime. Qty: 30 tablet, Refills: 3    umeclidinium-vilanterol (ANORO ELLIPTA) 62.5-25 MCG/INH AEPB Inhale 1 puff into the lungs daily. Qty: 60 each, Refills: 11    vitamin E 400 UNIT capsule Take 400 Units by mouth at bedtime.          Aspirin prescribed at discharge?  Yes High Intensity Statin Prescribed? (Lipitor 40-80mg  or Crestor 20-40mg ): Yes Beta Blocker Prescribed? Yes For EF <40%, was ACEI/ARB Prescribed? No: Soft  BP ADP Receptor Inhibitor Prescribed? (i.e. Plavix etc.-Includes Medically Managed Patients): Yes For EF <40%, Aldosterone Inhibitor Prescribed? No: Soft BP Was EF assessed during THIS hospitalization? Yes Was Cardiac Rehab II ordered? (Included Medically managed Patients): Yes   Outstanding Labs/Studies   BMET at follow up visit.  Duration of Discharge Encounter   Greater than 30 minutes including physician time.  Signed, Reino Bellis NP-C 08/14/2016, 3:34 PM   I have examined the patient and reviewed assessment and plan and discussed with patient.  Agree with above as stated.  Severe three-vessel coronary artery disease treated medically managed. She also has new onset systolic heart failure. Medications were limited due to low blood pressure. Given that she presented with pulmonary edema, will discharge on daily Lasix. Potassium supplement as well. She'll need electrolytes checked in 1-2 weeks. Physical therapy walked with her and their recommendations were followed. She did not report any further angina. I spoke to the daughter as well. The daughter plans to stay with her mother for the next 1-2 weeks.  She also had oxygen desaturations during sleep while on room air. While awake, saturations were in the 90s on room air. Order was placed for nighttime oxygen. Follow-up with Dr. Stanford Breed.  Follow-up with pulmonary for her interstitial lung disease (UIP). Will likely defer long-term management of her home oxygen to pulmonary service.  Larae Grooms

## 2016-08-14 NOTE — Evaluation (Signed)
Physical Therapy Evaluation Patient Details Name: Linda Kelly MRN: [id number] DOB: 04-04-1928 Today's Date: 08/14/2016   History of Present Illness  82 y.o. female admitted to [facility] on 08/14/16 for SOB.  Pt dx with NSTEMI s/p cardiac cath 08/10/16 with no acute intervention performed.  Pt being treated medically.  Pt with significant PMHx of MI, vertigo, tinnitis, stroke, SOB, PVD, HOH, COPD, CHF, CAD, L shoulder surgery, and R foot surgery.   Clinical Impression  Pt is generally weak and deconditioned from a few days in the hospital.  Her O2 sats actually did better with gait than at rest (at rest 90-91%, with gait 98-100%).  No DOE reported and other than a general sense that she felt weak and shaky from being in the bed for a few days, she was in no distress during mobility.  She politely declined offers of home PT and reported her daughter would work with her, so HEP handout provided and I spoke with pt's daughter about walking TID and doing her HEP TID.  Daughter verbalized understanding.  Pt is safe to return home.  PT to follow acutely until d/c confirmed.       Follow Up Recommendations No PT follow up;Other (comment) (pt reports daughter will work with her)    Equipment Recommendations  None recommended by PT    Recommendations for Other Services   NA     Precautions / Restrictions Precautions Precautions: Other (comment) Precaution Comments: monitor O2 sats on RA during activity      Mobility  Bed Mobility Overal bed mobility: Needs Assistance Bed Mobility: Supine to Sit     Supine to sit: Min assist     General bed mobility comments: min hand held assist to pull up to sitting on compliant mattress  Transfers Overall transfer level: Needs assistance Equipment used: Rolling walker (2 wheeled) Transfers: Sit to/from Stand Sit to Stand: Min guard         General transfer comment: min guard assist to support trunk during transitions.    Ambulation/Gait Ambulation/Gait assistance: Supervision Ambulation Distance (Feet): 150 Feet Assistive device: Rolling walker (2 wheeled) Gait Pattern/deviations: Step-through pattern;Shuffle;Trunk flexed Gait velocity: decreased   General Gait Details: verbal cues for upright posture.  Pt's O2 sats significantly better during gait than at rest.  Pt reports general fatigue.          Balance Overall balance assessment: Needs assistance Sitting-balance support: Feet supported;No upper extremity supported Sitting balance-Leahy Scale: Good     Standing balance support: Bilateral upper extremity supported;No upper extremity supported;Single extremity supported Standing balance-Leahy Scale: Fair                               Pertinent Vitals/Pain Pain Assessment: No/denies pain    Home Living Family/patient expects to be discharged to:: Private residence Living Arrangements: Alone Available Help at Discharge: Family;Available 24 hours/day Type of Home: House Home Access: Stairs to enter Entrance Stairs-Rails: None Entrance Stairs-Number of Steps: 1 Home Layout: One level Home Equipment: Walker - 2 wheels;Cane - single point;Shower seat Additional Comments: she still drives, does not use O2 at baseline.     Prior Function Level of Independence: Independent with assistive device(s)         Comments: pt uses a cane for gait when she leaves the house        Extremity/Trunk Assessment   Upper Extremity Assessment: Defer to OT evaluation  Lower Extremity Assessment: Generalized weakness (likely from being in bed a few days)      Cervical / Trunk Assessment: Kyphotic  Communication   Communication: No difficulties  Cognition Arousal/Alertness: Awake/alert Behavior During Therapy: WFL for tasks assessed/performed Overall Cognitive Status: Within Functional Limits for tasks assessed                      General Comments General  comments (skin integrity, edema, etc.): HEP provided.  Pt preferes that her daughter go on regular walks with her and help her do the HEP PT gave her today insteady of having a home therapist, so I talked to the pt and her daughter about walking around the house TID and doing the provided HEP TID.     Exercises General Exercises - Lower Extremity Long Arc Quad: AROM;Both;10 reps Hip ABduction/ADduction: AROM;Both;10 reps (adduction against pillow) Hip Flexion/Marching: AROM;Both;10 reps Toe Raises: AROM;Both;10 reps Heel Raises: PROM;Both;10 reps   Assessment/Plan    PT Assessment Patient needs continued PT services  PT Problem List Decreased strength;Decreased activity tolerance;Decreased balance;Decreased mobility;Decreased knowledge of use of DME          PT Treatment Interventions DME instruction;Gait training;Stair training;Functional mobility training;Therapeutic activities;Therapeutic exercise;Balance training;Patient/family education    PT Goals (Current goals can be found in the Care Plan section)  Acute Rehab PT Goals Patient Stated Goal: to go home today and avoid using O2 during the day.  PT Goal Formulation: With patient/family Time For Goal Achievement: 08/28/16 Potential to Achieve Goals: Good    Frequency Min 3X/week    End of Session Equipment Utilized During Treatment: Gait belt Activity Tolerance: Patient limited by fatigue Patient left: in chair;with call bell/phone within reach;with family/visitor present           Time: [id number] PT Time Calculation (min) (ACUTE ONLY): 21 min   Charges:   PT Evaluation $PT Eval Moderate Complexity: 1 Procedure          Linda Kelly B. Merwyn Hodapp, PT, DPT [phone]   08/14/2016, 2:26 PM

## 2016-08-14 NOTE — Progress Notes (Signed)
SATURATION QUALIFICATIONS: (This note is used to comply with regulatory documentation for home oxygen)  Patient Saturations on Room Air at Rest = 84%  Patient Saturations on Room Air while Ambulating = 96%  Patient Saturations on 2 Liters of oxygen while Ambulating = 96%  Please briefly explain why patient needs home oxygen:

## 2016-08-14 NOTE — Plan of Care (Signed)
Problem: Phase II Progression Outcomes Goal: Discharge plan in place and appropriate Outcome: Completed/Met Date Met: 08/14/16 Pt will be discharged home today after physical therapy sees her Goal: Pain controlled with appropriate interventions Outcome: Completed/Met Date Met: 08/14/16 Pt remains chest pain free Goal: Tolerates diet Outcome: Completed/Met Date Met: 08/14/16 Pt able to tolerate current heart healthy diet with no difficulties  Goal: Vascular site scale level 0 - I Vascular Site Scale Level 0: No bruising/bleeding/hematoma Level I (Mild): Bruising/Ecchymosis, minimal bleeding/ooozing, palpable hematoma < 3 cm Level II (Moderate): Bleeding not affecting hemodynamic parameters, pseudoaneurysm, palpable hematoma > 3 cm Level III  (Severe) Bleeding which affects hemodynamic parameters or retroperitoneal hemorrhage  Outcome: Completed/Met Date Met: 08/14/16 Radial and groin sites level 0

## 2016-08-14 NOTE — Care Management Note (Signed)
Case Management Note  Patient Details  Name: Linda Kelly MRN: [id number] Date of Birth: 05/19/1928  Subjective/Objective:   Pt presented for Nstemi. Plan will be for d/c home today with DME 02. CM did discuss with pt in regards of [facility] and pt is refusing services.                  Action/Plan: Pt states appropriate to use [facility] in regards to DME. Referral made to [facility] and DME to be delivered to room before d/c. No further needs identified.   Expected Discharge Date:                  Expected Discharge Plan:  Home/Self Care  In-House Referral:  NA  Discharge planning Services  CM Consult  Post Acute Care Choice:  Durable Medical Equipment Choice offered to:  Patient, Adult Children  DME Arranged:  Oxygen DME Agency:  [place] Arranged:  Patient Refused, NA [place] Agency:  NA  Status of Service:  Completed, signed off  If discussed at [place] of Stay Meetings, dates discussed:    Additional Comments:  Bethena Roys, RN 08/14/2016, 2:33 PM

## 2016-08-14 NOTE — Progress Notes (Signed)
Pt discharged home with daughter.  Reviewed how to work the home 02 tank.  Reviewed discharge instructions and education, all questions answered.  Assessment unchanged from earlier.

## 2016-08-14 NOTE — Telephone Encounter (Signed)
New message     TCM appt on  10.25.2017  With Almyra Deforest. Per Ria Comment.

## 2016-08-15 ENCOUNTER — Telehealth: Payer: Self-pay | Admitting: *Deleted

## 2016-08-15 NOTE — Telephone Encounter (Signed)
Patient contacted regarding discharge from [facility] on 08/14/16.  Patient understands to follow up with provider Almyra Deforest on 08/22/16 at 10:00am at [facility]. Patient understands discharge instructions? Yes Patient understands medications and regimen? Yes Patient understands to bring all medications to this visit? Yes

## 2016-08-15 NOTE — Telephone Encounter (Signed)
   Pt was on TCM list admitted for NSTEMI (non-ST elevated myocardial infarction) ([place]). Pt had Complicated procedure that demonstrated high-grade obstruction in the right iliac arterypreventing the procedure from being completed and requiring crossover to the left femoral artery. Inability to advance the pulmonary artery catheter into the pulmonary artery or capillary wedge position. RV systolic pressure is mildly to moderately elevated. D/C 10/17, and will f/u w/cardiology 10/25...Johny Chess

## 2016-08-22 ENCOUNTER — Encounter: Payer: Self-pay | Admitting: Physician Assistant

## 2016-08-22 ENCOUNTER — Ambulatory Visit (INDEPENDENT_AMBULATORY_CARE_PROVIDER_SITE_OTHER): Payer: PPO | Admitting: Physician Assistant

## 2016-08-22 VITALS — BP 114/80 | HR 74 | Ht <= 58 in | Wt 139.4 lb

## 2016-08-22 DIAGNOSIS — Z79899 Other long term (current) drug therapy: Secondary | ICD-10-CM

## 2016-08-22 DIAGNOSIS — I5042 Chronic combined systolic (congestive) and diastolic (congestive) heart failure: Secondary | ICD-10-CM

## 2016-08-22 DIAGNOSIS — I2510 Atherosclerotic heart disease of native coronary artery without angina pectoris: Secondary | ICD-10-CM | POA: Diagnosis not present

## 2016-08-22 DIAGNOSIS — J449 Chronic obstructive pulmonary disease, unspecified: Secondary | ICD-10-CM

## 2016-08-22 DIAGNOSIS — I739 Peripheral vascular disease, unspecified: Secondary | ICD-10-CM

## 2016-08-22 DIAGNOSIS — I779 Disorder of arteries and arterioles, unspecified: Secondary | ICD-10-CM | POA: Diagnosis not present

## 2016-08-22 DIAGNOSIS — E785 Hyperlipidemia, unspecified: Secondary | ICD-10-CM

## 2016-08-22 LAB — BASIC METABOLIC PANEL
BUN: 17 mg/dL (ref 7–25)
CALCIUM: 9 mg/dL (ref 8.6–10.4)
CO2: 31 mmol/L (ref 20–31)
CREATININE: 0.96 mg/dL — AB (ref 0.60–0.88)
Chloride: 98 mmol/L (ref 98–110)
Glucose, Bld: 80 mg/dL (ref 65–99)
Potassium: 4.7 mmol/L (ref 3.5–5.3)
Sodium: 139 mmol/L (ref 135–146)

## 2016-08-22 MED ORDER — METOPROLOL SUCCINATE ER 25 MG PO TB24: 25.0000 mg | ORAL_TABLET | Freq: Every day | ORAL | 9 refills | Status: DC

## 2016-08-22 NOTE — Patient Instructions (Addendum)
Medication Instructions:  STOP Metoprolol Tart START- Toprol XL 25 mg daily  Labwork: BMP Today  Testing/Procedures: None Ordered  Follow-Up: Your physician recommends that you schedule a follow-up appointment in: 1-2 Months with Dr Stanford Breed   Any Other Special Instructions Will Be Listed Below (If Applicable).     If you need a refill on your cardiac medications before your next appointment, please call your pharmacy.

## 2016-08-22 NOTE — Progress Notes (Signed)
Cardiology Office Note    Date:  08/22/2016   ID:  Linda Kelly, DOB 11-11-1927, MRN [id number]  PCP:  Binnie Rail, MD  Cardiologist:  Dr. Stanford Breed  Chief Complaint  Patient presents with  . Transitions Of Care    post hospital, NSTEMI, 7 day TCM followup, seen for Dr. Stanford Breed    History of Present Illness:  Linda Kelly is a 82 y.o. female with carotid artery disease s/p R CEA by Dr. Donnetta Hutching 03/03/2014, COPD, GERD, anxiety/depression, hyperlipidemia, h/o CVA, idiopathic pulmonary fibrosis and CAD. She had a remote history of MI in 1985 treated with balloon angioplasty, repeat cath in July 1985 showed patent RCA, recently she was admitted to the hospital on 08/08/2016 due to significant fatigue. She denies any chest discomfort. On arrival, her troponin was elevated at 11.9. Cardiology was consulted, she was admitted to the hospital, serial troponin overnight continued to trend down. Echocardiogram obtained on the following day on 08/10/2016 showed EF 40-45%, moderate hypokinesis with basal mid anterolateral and inferolateral myocardium, consistent with ischemia in the distribution of left circumflex territory, grade 2 diastolic dysfunction, mild to moderate MR. Cardiac catheterization performed on 10/13 revealed chronic total occlusion of mid RCA with right to right and left-to-right collateral, total occlusion of mid LAD beyond large first diagonal, 99% stenosis in mid to distal left circumflex beyond the very tortuous segment, EF 35-45%. PCI of left circumflex was not attempted because the distal territory was relatively small and unlikely to change her clinical status. If PCI of was attempted, it will be relatively high risk of inability to deliver stent or primary failure due to tortuosity. Procedure was further complicated by the fact that there was inability to advance from the right groin due to high-grade obstruction in the right iliac artery prompting the interventional cardiologist to  proceed on the left side. There was also inability to advance the pulmonary artery catheter into the pulmonary artery during right heart cath despite her RV systolic pressure was mildly to moderately elevated at 42/3/8. Her LVEDP was 21 mmHg. It was felt her presentation was acute on chronic combined systolic and diastolic heart failure.  Given the RHC result, she was given additional 40 mg IV Lasix with good urinary output. She was on aspirin, Plavix, metoprolol and simvastatin. Blood pressure was too soft to add ace inhibitors in the hospital. Her weight during the recent admission due to decreased from 145 pounds down to 137 pounds with net urinary output of 2.6 L. She was able to weaned from supplemental oxygen while awake, however saturation 10 to dip down to mid 80s while resting. She was discharged on home O2 at night.  Patient presents today for cardiology office evaluation. She has been doing okay since discharge. She denies any significant chest discomfort or shortness of breath. According to the daughter, it did take her roughly the entire day after discharge to recover back to her baseline. She denies any significant orthopnea, paroxysmal nocturnal dyspnea or lower extremity edema. She has lost additional several pounds since she left the hospital. She has been compliant with her medication however there is some confusion about how much Lasix she has been taking. Per record, she was discharged on 40 mg daily of Lasix, however according to her, she was instructed to take 1/2 tablet of Lasix twice a day, I told her she will be taking 40 mg total per day of Lasix using either method. While for her other concerns she has been  having some dry nose and nasal bleeding after using nighttime nasal cannula. We will give her a humidifier. Otherwise given her LV dysfunction, I think it would be more beneficial for her to be on Toprol-XL 25 mg daily. Given the use of Lasix, we will also obtain a basic metabolic  panel today. Otherwise she has denied any significant chest discomfort since she left the hospital. If her blood pressure is stable on follow-up, then I will consider adding ACE inhibitor. Otherwise her blood pressure is stable today.   Past Medical History:  Diagnosis Date  . Anxiety   . Anxiety and depression   . Arthritis   . Benign neoplasm of esophagus   . CAD (coronary artery disease)   . Cancer [facility])    Skin cancer on back  . Carotid artery disease ([place])   . CHF (congestive heart failure) ([place])   . Chronic gastritis   . Colon, diverticulosis   . COPD (chronic obstructive pulmonary disease) ([place])   . Depression   . GERD (gastroesophageal reflux disease)   . Hiatal hernia   . High frequency hearing loss   . Hyperlipidemia   . IBS (irritable bowel syndrome)   . Idiopathic pulmonary fibrosis   . Internal hemorrhoid   . MI (myocardial infarction) 1985  . Other nonthrombocytopenic purpuras   . PONV (postoperative nausea and vomiting)   . PVD (peripheral vascular disease) ([place])   . Shortness of breath    with exertion  . Solitary pulmonary nodule   . Stroke [facility])    Hx: of several mini -strokes  . Tinnitus    chronic  . Vertigo     Past Surgical History:  Procedure Laterality Date  . ANGIOPLASTY  1997, 98, 99  . BREAST SURGERY     Hx; of biopsy  . CARDIAC CATHETERIZATION N/A 08/10/2016   Procedure: Left Heart Cath and Coronary Angiography;  Surgeon: Belva Crome, MD;  Location: [place] [facility];  Service: Cardiovascular;  Laterality: N/A;  . CAROTID ENDARTERECTOMY     left side x 3, saphenous vein graft from left leg  . CATARACT EXTRACTION W/ INTRAOCULAR LENS  IMPLANT, BILATERAL    . COLON SURGERY    . COLONOSCOPY  11-27-01, 10-30-05, 05-24-11   diverticulosis, hemorrhoids  . DENTAL SURGERY     2012   . ENDARTERECTOMY Right 03/03/2014   Procedure: RIGHT CAROTID ENDARTERECTOMY WITH PATCH ANGIOPLASTY;  Surgeon: Rosetta Posner, MD;  Location: [place];  Service: Vascular;   Laterality: Right;  . FOOT SURGERY     bilateral  . SHOULDER SURGERY     left  . TOE SURGERY     right foot  . TONSILLECTOMY    . TUBAL LIGATION    . UPPER GASTROINTESTINAL ENDOSCOPY  11-27-01, 08-18-08   Hiatal hernia, benign esophagus neoplasia, gastritis, tortuous esophagus 74 [address]oney dilation performed    Current Medications: Outpatient Medications Prior to Visit  Medication Sig Dispense Refill  . aspirin EC 81 MG tablet Take 81 mg by mouth at bedtime.     . cholecalciferol (VITAMIN D) 1000 UNITS tablet Take 1,000 Units by mouth at bedtime.     . clopidogrel (PLAVIX) 75 MG tablet Take 1 tablet (75 mg total) by mouth daily. (Patient taking differently: Take 75 mg by mouth at bedtime. ) 90 tablet 1  . folic acid (FOLVITE) A999333 MCG tablet Take 400 mcg by mouth at bedtime.     . furosemide (LASIX) 40 MG tablet Take 1 tablet (  40 mg total) by mouth daily. 30 tablet 6  . loratadine (CLARITIN) 10 MG tablet Take 10 mg by mouth daily as needed for allergies.    . montelukast (SINGULAIR) 10 MG tablet Take 1 tablet (10 mg total) by mouth at bedtime. 90 tablet 3  . nitroGLYCERIN (NITROSTAT) 0.4 MG SL tablet Place 1 tablet (0.4 mg total) under the tongue every 5 (five) minutes x 3 doses as needed for chest pain. 25 tablet 3  . Omega-3 Fatty Acids (FISH OIL) 1000 MG CAPS Take 1,000 mg by mouth at bedtime.     . potassium chloride 20 MEQ TBCR Take 10 mEq by mouth daily. 30 tablet 6  . ranitidine (ZANTAC) 150 MG tablet TAKE 1 TABLET BY MOUTH TWICE DAILY (Patient taking differently: Take 150 mg by mouth 2 (two) times daily. ) 180 tablet 3  . sertraline (ZOLOFT) 50 MG tablet Take 1 tablet (50 mg total) by mouth daily. (Patient taking differently: Take 50 mg by mouth at bedtime. ) 90 tablet 3  . simvastatin (ZOCOR) 40 MG tablet Take 1 tablet (40 mg total) by mouth daily. (Patient taking differently: Take 40 mg by mouth at bedtime. ) 90 tablet 3  . tolterodine (DETROL) 2 MG tablet Take 1 tablet (2 mg  total) by mouth at bedtime. 30 tablet 3  . umeclidinium-vilanterol (ANORO ELLIPTA) 62.5-25 MCG/INH AEPB Inhale 1 puff into the lungs daily. 60 each 11  . vitamin E 400 UNIT capsule Take 400 Units by mouth at bedtime.     . metoprolol tartrate (LOPRESSOR) 25 MG tablet Take 0.5 tablets (12.5 mg total) by mouth 2 (two) times daily. 60 tablet 6   No facility-administered medications prior to visit.      Allergies:   Review of patient's allergies indicates no known allergies.   Social History   Social History  . Marital status: Widowed    Spouse name: N/A  . Number of children: 6  . Years of education: N/A   Occupational History  . 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 Smoking status: Former Smoker    Packs/day: 0.50    Years: 60.00    Types: Cigarettes    Quit date: 02/26/2014  . Smokeless tobacco: Never Used  . Alcohol use No  . Drug use: No  . Sexual activity: No   Other Topics Concern  . 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   Pt does not get regular exercise     Family History:  The patient's family history includes Breast cancer in her maternal aunt; Cancer in her sister; Colitis in her mother and sister; Colon cancer in her father; Heart attack in her father and son; Heart disease in her father and mother; Hyperlipidemia in her brother, daughter, father, mother, sister, sister, and son; Hypertension in her daughter and son; Lung cancer in her paternal aunt; Other in her brother; Prostate cancer in her father; Stroke in her mother; Vasculitis in her son.   ROS:   Please see the history of present illness.    ROS All other systems reviewed and are negative.   PHYSICAL EXAM:   VS:  BP 114/80   Pulse 74   Ht 4\' 10"  (1.473 m)   Wt 139 lb 6.4 oz (63.2 kg)   SpO2 96%  BMI 29.13 kg/m    GEN: Well nourished,  well developed, in no acute distress  HEENT: normal  Neck: no JVD, carotid bruits, or masses Cardiac: RRR; no murmurs, rubs, or gallops,no edema  Respiratory:  clear to auscultation bilaterally, normal work of breathing GI: soft, nontender, nondistended, + BS MS: no deformity or atrophy  Skin: warm and dry, no rash Neuro:  Alert and Oriented x 3, Strength and sensation are intact Psych: euthymic mood, full affect  Wt Readings from Last 3 Encounters:  08/22/16 139 lb 6.4 oz (63.2 kg)  08/14/16 137 lb 11.2 oz (62.5 kg)  08/08/16 148 lb 8 oz (67.4 kg)      Studies/Labs Reviewed:   EKG:  EKG is not ordered today.    Recent Labs: 02/28/2016: TSH 1.92 08/08/2016: ALT 13 08/11/2016: B Natriuretic Peptide 532.9 08/14/2016: BUN 24; Creatinine, Ser 1.02; Hemoglobin 10.8; Platelets 235; Potassium 4.7; Sodium 137   Lipid Panel    Component Value Date/Time   CHOL 163 08/09/2016 0140   TRIG 147 08/09/2016 0140   HDL 42 08/09/2016 0140   CHOLHDL 3.9 08/09/2016 0140   VLDL 29 08/09/2016 0140   LDLCALC 92 08/09/2016 0140    Additional studies/ records that were reviewed today include:   Carotid US 05/08/2016   LE ABI 10/04/2015     Echo 08/10/2016 - Left ventricle: The cavity size was normal. Wall thickness was   normal. Systolic function was mildly to moderately reduced. The   estimated ejection fraction was in the range of 40% to 45%.   Moderate hypokinesis of the basal-midanterolateral and   inferolateral myocardium; consistent with ischemia in the   distribution of the left circumflex coronary artery. Features are   consistent with a pseudonormal left ventricular filling pattern,   with concomitant abnormal relaxation and increased filling   pressure (grade 2 diastolic dysfunction). - Mitral valve: Moderately calcified annulus. There was mild to   moderate regurgitation directed centrally. - Left atrium: The atrium was mildly dilated.   Cath [id number]  Complicated  procedure that demonstrated high-grade obstruction in the right iliac artery preventing the procedure from being completed and requiring crossover to the left femoral artery.  Inability to advance the pulmonary artery catheter into the pulmonary artery or capillary wedge position. RV systolic pressure is mildly to moderately elevated.  Severe multivessel coronary disease with chronic total occlusion of the mid RCA collateralized by right to right and left-to-right sources.  Total occlusion of the mid LAD beyond a large first diagonal. Left left collaterals are noted.  99% stenosis in the mid to distal native circumflex beyond a very tortuous segment. PCI was not attempted. Distal territory is relatively small.  Inferior wall severe hypokinesis involving the basal to mid segment. Anterior and apical mild hypokinesis is noted. Ejection fraction is estimated to be 35-45%. Presentation was that of acute on chronic combined systolic and diastolic heart failure.  RECOMMENDATIONS:   Medical therapy with optimization of heart failure regimen.  Antiplatelet therapy.  Given the patient's age, I do not believe surgical revascularization is warranted/prudent.  Only PCI target would be the mid circumflex, which I do not believe would change her clinical status (not having ischemic pain or evidence of ongoing infarction). There would be relatively high risk of inability to deliver stent and/or primary failure due to tortuosity.   ASSESSMENT:    1. Chronic combined systolic and diastolic CHF (congestive heart failure) ([place])   2. Chronic obstructive pulmonary disease, unspecified COPD type ([place])  3. Coronary artery disease involving native coronary artery of native heart without angina pectoris   4. Carotid artery disease, unspecified laterality ([place])   5. Hyperlipidemia, unspecified hyperlipidemia type   6. Medication management      PLAN:  In order of problems listed above:  1. Chronic  combined systolic and diastolic heart failure - She is euvolemic based on physical exam. Recent echocardiogram shows her ejection fraction was 40-45%, however cardiac cath showed was 35-45%. Given LV dysfunction, we will change her metoprolol tartrate to Toprol-XL.  - if her blood pressure stable, we will consider adding lisinopril, her blood pressure is borderline today.  2. COPD and pulmonary fibrosis: She does have very fine crackle near bilateral bases, likely related to pulmonary fibrosis, low suspicion of fluid accumulation based on physical exam. She is being followed by Dr. Annamaria Boots.  3. CAD: Status significant triple-vessel disease on recent cath, with chronically occluded mid RCA and left circumflex, and significant stenosis in the left circumflex which will be high risk for any PCI. Currently on medical therapy. She denies any obvious angina.  4. Carotid artery disease: She had a right CEA by Dr. Donnetta Hutching 2015.  5. Hyperlipidemia: Currently on pravastatin, her recent lipid panel was normal.  6. R iliac artery obstruction: Noted during recent cath, eventually when he informed the left side. Surprisingly, she had a ABI in December 2016 that did not shows significant drop in ABI bilaterally. Patient also denies any claudication symptoms with walking. Will need repeat lower extremity arterial Doppler on follow-up.   Medication Adjustments/Labs and Tests Ordered: Current medicines are reviewed at length with the patient today.  Concerns regarding medicines are outlined above.  Medication changes, Labs and Tests ordered today are listed in the Patient Instructions below. Patient Instructions  Medication Instructions:  STOP Metoprolol Tart START- Toprol XL 25 mg daily  Labwork: BMP Today  Testing/Procedures: None Ordered  Follow-Up: Your physician recommends that you schedule a follow-up appointment in: 1-2 Months with Dr Stanford Breed   Any Other Special Instructions Will Be Listed Below  (If Applicable).     If you need a refill on your cardiac medications before your next appointment, please call your pharmacy.      Hilbert Corrigan, [place]  08/22/2016 11:30 AM    [address], [place], [place]  [address] Phone: [phone]; Fax: [phone]

## 2016-08-28 ENCOUNTER — Other Ambulatory Visit: Payer: Self-pay | Admitting: *Deleted

## 2016-08-28 ENCOUNTER — Encounter: Payer: Self-pay | Admitting: Internal Medicine

## 2016-08-28 ENCOUNTER — Ambulatory Visit (INDEPENDENT_AMBULATORY_CARE_PROVIDER_SITE_OTHER): Payer: PPO | Admitting: Internal Medicine

## 2016-08-28 VITALS — BP 114/86 | HR 79 | Temp 97.6°F | Resp 16 | Ht <= 58 in | Wt 143.0 lb

## 2016-08-28 DIAGNOSIS — F32A Depression, unspecified: Secondary | ICD-10-CM

## 2016-08-28 DIAGNOSIS — R51 Headache: Secondary | ICD-10-CM | POA: Diagnosis not present

## 2016-08-28 DIAGNOSIS — F329 Major depressive disorder, single episode, unspecified: Secondary | ICD-10-CM

## 2016-08-28 DIAGNOSIS — I5041 Acute combined systolic (congestive) and diastolic (congestive) heart failure: Secondary | ICD-10-CM | POA: Diagnosis not present

## 2016-08-28 DIAGNOSIS — R42 Dizziness and giddiness: Secondary | ICD-10-CM

## 2016-08-28 DIAGNOSIS — M5416 Radiculopathy, lumbar region: Secondary | ICD-10-CM

## 2016-08-28 DIAGNOSIS — G479 Sleep disorder, unspecified: Secondary | ICD-10-CM

## 2016-08-28 DIAGNOSIS — I10 Essential (primary) hypertension: Secondary | ICD-10-CM | POA: Diagnosis not present

## 2016-08-28 DIAGNOSIS — R519 Headache, unspecified: Secondary | ICD-10-CM

## 2016-08-28 MED ORDER — TOLTERODINE TARTRATE 2 MG PO TABS: 2.0000 mg | ORAL_TABLET | Freq: Every day | ORAL | 1 refills | Status: DC

## 2016-08-28 NOTE — Assessment & Plan Note (Signed)
BP well controlled Current regimen effective and well tolerated Continue current medications at current doses  

## 2016-08-28 NOTE — Progress Notes (Signed)
Pre visit review using our clinic review tool, if applicable. No additional management support is needed unless otherwise documented below in the visit note. 

## 2016-08-28 NOTE — Assessment & Plan Note (Signed)
Having intermittent pain-worse since being in the hospital being. Continue to increase activity as tolerated Pain relief with Tylenol-continue Deferred referral to orthopedics-she did miss her appointment she is in the hospital Will let me know if she wants to be referred again

## 2016-08-28 NOTE — Assessment & Plan Note (Signed)
Occurs approximately twice a month Advised her to get up from bed when she is not able to sleep and try reading-doing household chores or activities Ideally would like to avoid additional medication because of possible side effects She will continue to work on this and let me know if she feels she really needs to take medication, but otherwise we will not start any new medications.

## 2016-08-28 NOTE — Assessment & Plan Note (Signed)
She has been experiencing headaches since leaving the hospital - it is intermittent and improves with tylenol Possible sinus related to due to medications, low BP Mild in nature Will monitor for now Continue tylenol as needed

## 2016-08-28 NOTE — Patient Instructions (Addendum)
   All other Health Maintenance issues reviewed.   All recommended immunizations and age-appropriate screenings are up-to-date or discussed.  No immunizations administered today.   Medications reviewed and updated.  No changes recommended at this time.   Please followup in 4-6 months, sooner if needed

## 2016-08-28 NOTE — Progress Notes (Signed)
Subjective:    Patient ID: Linda Kelly, female    DOB: Mar 23, 1928, 82 y.o.   MRN: [id number]  HPI The patient is here for follow up.  She was here earlier this month. Wellness visit and was found to be very short of breath and hypoxic. Her symptoms started the day before her appointment. She felt short of breath and fatigue. Her symptoms persisted and during her appointment because of her hypoxia and significant shortness of breath we sent her to the emergency room for further evaluation. Her troponin was elevated and she was diagnosed with an NSTEMI.  She was found to have severe multivessel coronary artery disease with chronic total occlusion of the mid RCA. She total occlusion of the mid LAD and 99% stenosis of the mid to distal circumflex. There is inferior wall severe hypokinesis. Her EF was approximately 35-45%. It was recommended that we optimize medical therapy. Was not felt that she was a good surgical candidate. No PCI was attempted. She was started on a beta blocker, lasix and potassium.  She feels she is getting stronger every day.  She has a slight headache at times - this started after the hospital.  It is primarily in the frontal region. She does have sinus issues, but has no other sinus symptoms now.  The headache started after being hospitalized.  She also has some lightheadedness.  She gets lightheaded when she stands up.  She does not walk around much but has had some lightheadedness with walking around. She has taken tylenol for her headache and it helps.  Her symptoms are mild and do not affect her activities.     Twice a month she has a sleepless night and it is miserable.  She gets up and walks around or does housework.  She wonders if she can have a sleep medication.    She has been having left left pain since being in the hospital.  She had lumbar radiculopathy earlier this year.  Tylenol seems to help.  She does not want to see ortho at this time - she missed her appt when  she was in the hospital.     Medications and allergies reviewed with patient and updated if appropriate.  Patient Active Problem List   Diagnosis Date Noted  . Acute combined systolic and diastolic congestive heart failure ([place])   . UIP (usual interstitial pneumonitis) ([place])   . Acute CHF (HCC)-etiology not yet determined 08/09/2016  . NSTEMI (non-ST elevated myocardial infarction) ([place]) 08/08/2016  . Lumbar radiculopathy 08/01/2016  . Left-sided low back pain without sciatica 03/28/2016  . Cephalalgia 03/10/2016  . Overactive bladder 02/21/2016  . Poor balance 02/21/2016  . Chronic lower back pain 02/21/2016  . Carotid stenosis-s/p CEA 2015 03/03/2014  . Thoracic aorta atherosclerosis ([place]) 02/04/2014  . Change in bowel habits 05/22/2011  . URINARY INCONTINENCE 12/22/2010  . ABDOMINAL BRUIT 08/10/2010  . FASTING HYPERGLYCEMIA 11/15/2009  . Depression 08/01/2009  . GERD 08/01/2009  . IRRITABLE BOWEL SYNDROME 12/27/2008  . TINNITUS, CHRONIC 11/05/2008  . HEARING LOSS, HIGH FREQUENCY 11/05/2008  . HIATAL HERNIA 08/11/2008  . OTHER NONTHROMBOCYTOPENIC PURPURAS 06/08/2008  . ORTHOSTATIC HYPOTENSION 06/08/2008  . Idiopathic interstitial pneumonia/ UIP 11/20/2007  . Lung nodule 11/20/2007  . OBESITY, MILD 11/19/2007  . Hyperlipidemia 10/09/2007  . Tobacco abuse, in remission 10/09/2007  . Essential hypertension 10/09/2007  . CAD S/P percutaneous coronary angioplasty 1985 10/09/2007  . Peripheral vascular disease ([place]) 10/09/2007  . COPD mixed type ([place]) 10/09/2007  .  VERTIGO 03/31/2007  . HEADACHE 03/31/2007    Current Outpatient Prescriptions on File Prior to Visit  Medication Sig Dispense Refill  . aspirin EC 81 MG tablet Take 81 mg by mouth at bedtime.     . cholecalciferol (VITAMIN D) 1000 UNITS tablet Take 1,000 Units by mouth at bedtime.     . clopidogrel (PLAVIX) 75 MG tablet Take 1 tablet (75 mg total) by mouth daily. (Patient taking differently: Take 75 mg by mouth  at bedtime. ) 90 tablet 1  . folic acid (FOLVITE) A999333 MCG tablet Take 400 mcg by mouth at bedtime.     . furosemide (LASIX) 40 MG tablet Take 1 tablet (40 mg total) by mouth daily. 30 tablet 6  . KLOR-CON M20 20 MEQ tablet Take 0.5 mEq by mouth daily.    Marland Kitchen loratadine (CLARITIN) 10 MG tablet Take 10 mg by mouth daily as needed for allergies.    . metoprolol succinate (TOPROL XL) 25 MG 24 hr tablet Take 1 tablet (25 mg total) by mouth daily. 30 tablet 9  . montelukast (SINGULAIR) 10 MG tablet Take 1 tablet (10 mg total) by mouth at bedtime. 90 tablet 3  . nitroGLYCERIN (NITROSTAT) 0.4 MG SL tablet Place 1 tablet (0.4 mg total) under the tongue every 5 (five) minutes x 3 doses as needed for chest pain. 25 tablet 3  . Omega-3 Fatty Acids (FISH OIL) 1000 MG CAPS Take 1,000 mg by mouth at bedtime.     . potassium chloride 20 MEQ TBCR Take 10 mEq by mouth daily. 30 tablet 6  . ranitidine (ZANTAC) 150 MG tablet TAKE 1 TABLET BY MOUTH TWICE DAILY (Patient taking differently: Take 150 mg by mouth 2 (two) times daily. ) 180 tablet 3  . sertraline (ZOLOFT) 50 MG tablet Take 1 tablet (50 mg total) by mouth daily. (Patient taking differently: Take 50 mg by mouth at bedtime. ) 90 tablet 3  . simvastatin (ZOCOR) 40 MG tablet Take 1 tablet (40 mg total) by mouth daily. (Patient taking differently: Take 40 mg by mouth at bedtime. ) 90 tablet 3  . tolterodine (DETROL) 2 MG tablet Take 1 tablet (2 mg total) by mouth at bedtime. 30 tablet 3  . umeclidinium-vilanterol (ANORO ELLIPTA) 62.5-25 MCG/INH AEPB Inhale 1 puff into the lungs daily. 60 each 11  . vitamin E 400 UNIT capsule Take 400 Units by mouth at bedtime.      No current facility-administered medications on file prior to visit.     Past Medical History:  Diagnosis Date  . Anxiety   . Anxiety and depression   . Arthritis   . Benign neoplasm of esophagus   . CAD (coronary artery disease)   . Cancer [facility])    Skin cancer on back  . Carotid artery  disease ([place])   . CHF (congestive heart failure) ([place])   . Chronic gastritis   . Colon, diverticulosis   . COPD (chronic obstructive pulmonary disease) ([place])   . Depression   . GERD (gastroesophageal reflux disease)   . Hiatal hernia   . High frequency hearing loss   . Hyperlipidemia   . IBS (irritable bowel syndrome)   . Idiopathic pulmonary fibrosis   . Internal hemorrhoid   . MI (myocardial infarction) 1985  . Other nonthrombocytopenic purpuras   . PONV (postoperative nausea and vomiting)   . PVD (peripheral vascular disease) ([place])   . Shortness of breath    with exertion  . Solitary pulmonary nodule   .  Stroke [facility])    Hx: of several mini -strokes  . Tinnitus    chronic  . Vertigo     Past Surgical History:  Procedure Laterality Date  . ANGIOPLASTY  1997, 98, 99  . BREAST SURGERY     Hx; of biopsy  . CARDIAC CATHETERIZATION N/A 08/10/2016   Procedure: Left Heart Cath and Coronary Angiography;  Surgeon: Belva Crome, MD;  Location: [place] [facility];  Service: Cardiovascular;  Laterality: N/A;  . CAROTID ENDARTERECTOMY     left side x 3, saphenous vein graft from left leg  . CATARACT EXTRACTION W/ INTRAOCULAR LENS  IMPLANT, BILATERAL    . COLON SURGERY    . COLONOSCOPY  11-27-01, 10-30-05, 05-24-11   diverticulosis, hemorrhoids  . DENTAL SURGERY     2012   . ENDARTERECTOMY Right 03/03/2014   Procedure: RIGHT CAROTID ENDARTERECTOMY WITH PATCH ANGIOPLASTY;  Surgeon: Rosetta Posner, MD;  Location: [place];  Service: Vascular;  Laterality: Right;  . FOOT SURGERY     bilateral  . SHOULDER SURGERY     left  . TOE SURGERY     right foot  . TONSILLECTOMY    . TUBAL LIGATION    . UPPER GASTROINTESTINAL ENDOSCOPY  11-27-01, 08-18-08   Hiatal hernia, benign esophagus neoplasia, gastritis, tortuous esophagus 29 Maloney dilation performed    Social History   Social History  . Marital status: Widowed    Spouse name: N/A  . Number of children: 6  . Years of education: N/A    Occupational History  . 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 Smoking status: Former Smoker    Packs/day: 0.50    Years: 60.00    Types: Cigarettes    Quit date: 02/26/2014  . Smokeless tobacco: Never Used  . Alcohol use No  . Drug use: No  . Sexual activity: No   Other Topics Concern  . Not on file   Social History Narrative   [place] to [place] twice a year to visit sister- 3-4 weeks travel there every year.   Pt does not get regular exercise    Family History  Problem Relation Age of Onset  . Heart attack Father     deceased at 31, MGF  . Colon cancer Father   . Prostate cancer Father   . Heart disease Father   . Hyperlipidemia Father   . Heart disease Mother     deaceased at age 80  . Colitis Mother   . Stroke Mother   . Hyperlipidemia Mother   . Colitis Sister   . Hyperlipidemia Sister   . Other Brother     deceased age 63; war  . Hyperlipidemia Brother   . Breast cancer Maternal Aunt     great   . Lung cancer Paternal Aunt     great  . Vasculitis Son   . Hyperlipidemia Son   . Hypertension Son   . Heart attack Son   . Cancer Sister     unknown  . Hyperlipidemia Sister   . Hyperlipidemia Daughter   . Hypertension Daughter   . COPD Neg Hx   . Asthma Neg Hx     Review of Systems  Constitutional: Positive for fatigue (with activity). Negative for appetite change, chills and fever.  HENT: Negative for congestion, ear pain, sinus pressure and sore throat.   Respiratory: Positive for  shortness of breath. Negative for cough and wheezing.   Cardiovascular: Negative for chest pain, palpitations and leg swelling.  Gastrointestinal: Negative for abdominal pain.  Neurological: Positive for light-headedness and headaches.       Objective:   Vitals:   08/28/16 1119  BP: 114/86  Pulse: 79  Resp: 16  Temp: 97.6 F (36.4 C)   Filed Weights   08/28/16 1119  Weight: 143  lb (64.9 kg)   Body mass index is 29.89 kg/m.   Physical Exam    Constitutional: Appears well-developed and well-nourished. No distress.  HENT:  Head: Normocephalic and atraumatic.  Neck: Neck supple. No tracheal deviation present. No thyromegaly present.  No cervical lymphadenopathy Cardiovascular: Normal rate, regular rhythm and normal heart sounds.   2/6 systolic murmur heard. No carotid bruit .  No edema Pulmonary/Chest: Effort normal and breath sounds normal. No respiratory distress. No has no wheezes. No rales.  Skin: Skin is warm and dry. Not diaphoretic.  Psychiatric: Normal mood and affect. Behavior is normal.      Assessment & Plan:    See Problem List for Assessment and Plan of chronic medical problems.   F/u in 4-6 months

## 2016-08-28 NOTE — Assessment & Plan Note (Signed)
Started after being in the hospital Likely medication related and orthostatic to some degree Continue current medications Her symptoms are almost and she is aware of the increased risk of falls

## 2016-08-28 NOTE — Assessment & Plan Note (Signed)
euvolemic here today Weighing herself at home Continue current medications Management per cardio

## 2016-08-28 NOTE — Assessment & Plan Note (Signed)
Controlled, stable Continue current dose of medication  

## 2016-09-10 NOTE — Progress Notes (Signed)
HPI: FU carotid artery disease, CAD, HLD, pulmonary fibrosis. Her cardiac issues date back to 38 when she had an MI. She was treated with a balloon angioplasty at that time. Renal dopplers 12/12 showed stable 1-59 bilateral renal artery stenosis. Patient had a right carotid endarterectomy in May 2015. Patient was admitted with congestive heart failure October 2017. Echocardiogram October 2017 showed ejection fraction A999333, grade 2 diastolic dysfunction and mild to moderate mitral regurgitation. Cardiac catheterization October 2017 showed high-grade obstruction in the right iliac artery. There was severe multivessel coronary artery disease with occlusion of the right coronary artery, occlusion of the LAD, 99% distal circumflex and a very tortuous segment. Ejection fraction 35-45%. Medical therapy recommended. Surgical and vascular resection was not felt to be possible given her age and comorbidities. Patient has been treated with diuretics. Since last seen, the patient has dyspnea with more extreme activities but not with routine activities. It is relieved with rest. It is not associated with chest pain. There is no orthopnea, PND or pedal edema. There is no syncope or palpitations. There is no exertional chest pain.   Current Outpatient Prescriptions  Medication Sig Dispense Refill  . aspirin EC 81 MG tablet Take 81 mg by mouth at bedtime.     . cholecalciferol (VITAMIN D) 1000 UNITS tablet Take 1,000 Units by mouth at bedtime.     . clopidogrel (PLAVIX) 75 MG tablet Take 1 tablet (75 mg total) by mouth daily. (Patient taking differently: Take 75 mg by mouth at bedtime. ) 90 tablet 1  . folic acid (FOLVITE) A999333 MCG tablet Take 400 mcg by mouth at bedtime.     . furosemide (LASIX) 40 MG tablet Take 1 tablet (40 mg total) by mouth daily. 30 tablet 6  . KLOR-CON M20 20 MEQ tablet Take 0.5 mEq by mouth daily.    Marland Kitchen loratadine (CLARITIN) 10 MG tablet Take 10 mg by mouth daily as needed for  allergies.    . metoprolol succinate (TOPROL XL) 25 MG 24 hr tablet Take 1 tablet (25 mg total) by mouth daily. 30 tablet 9  . montelukast (SINGULAIR) 10 MG tablet Take 1 tablet (10 mg total) by mouth at bedtime. 90 tablet 3  . nitroGLYCERIN (NITROSTAT) 0.4 MG SL tablet Place 1 tablet (0.4 mg total) under the tongue every 5 (five) minutes x 3 doses as needed for chest pain. 25 tablet 3  . Omega-3 Fatty Acids (FISH OIL) 1000 MG CAPS Take 1,000 mg by mouth at bedtime.     . potassium chloride 20 MEQ TBCR Take 10 mEq by mouth daily. 30 tablet 6  . ranitidine (ZANTAC) 150 MG tablet TAKE 1 TABLET BY MOUTH TWICE DAILY (Patient taking differently: Take 150 mg by mouth 2 (two) times daily. ) 180 tablet 3  . sertraline (ZOLOFT) 50 MG tablet Take 1 tablet (50 mg total) by mouth daily. (Patient taking differently: Take 50 mg by mouth at bedtime. ) 90 tablet 3  . simvastatin (ZOCOR) 40 MG tablet Take 1 tablet (40 mg total) by mouth daily. (Patient taking differently: Take 40 mg by mouth at bedtime. ) 90 tablet 3  . tolterodine (DETROL) 2 MG tablet Take 1 tablet (2 mg total) by mouth at bedtime. 90 tablet 1  . umeclidinium-vilanterol (ANORO ELLIPTA) 62.5-25 MCG/INH AEPB Inhale 1 puff into the lungs daily. 60 each 11  . vitamin E 400 UNIT capsule Take 400 Units by mouth at bedtime.      No current  facility-administered medications for this visit.      Past Medical History:  Diagnosis Date  . Anxiety   . Anxiety and depression   . Arthritis   . Benign neoplasm of esophagus   . CAD (coronary artery disease)   . Cancer [facility])    Skin cancer on back  . Carotid artery disease ([place])   . CHF (congestive heart failure) ([place])   . Chronic gastritis   . Colon, diverticulosis   . COPD (chronic obstructive pulmonary disease) ([place])   . Depression   . GERD (gastroesophageal reflux disease)   . Hiatal hernia   . High frequency hearing loss   . Hyperlipidemia   . IBS (irritable bowel syndrome)   . Idiopathic  pulmonary fibrosis   . Internal hemorrhoid   . MI (myocardial infarction) 1985  . Other nonthrombocytopenic purpuras   . PONV (postoperative nausea and vomiting)   . PVD (peripheral vascular disease) ([place])   . Shortness of breath    with exertion  . Solitary pulmonary nodule   . Stroke [facility])    Hx: of several mini -strokes  . Tinnitus    chronic  . Vertigo     Past Surgical History:  Procedure Laterality Date  . ANGIOPLASTY  1997, 98, 99  . BREAST SURGERY     Hx; of biopsy  . CARDIAC CATHETERIZATION N/A 08/10/2016   Procedure: Left Heart Cath and Coronary Angiography;  Surgeon: Belva Crome, MD;  Location: [place] [facility];  Service: Cardiovascular;  Laterality: N/A;  . CAROTID ENDARTERECTOMY     left side x 3, saphenous vein graft from left leg  . CATARACT EXTRACTION W/ INTRAOCULAR LENS  IMPLANT, BILATERAL    . COLON SURGERY    . COLONOSCOPY  11-27-01, 10-30-05, 05-24-11   diverticulosis, hemorrhoids  . DENTAL SURGERY     2012   . ENDARTERECTOMY Right 03/03/2014   Procedure: RIGHT CAROTID ENDARTERECTOMY WITH PATCH ANGIOPLASTY;  Surgeon: Rosetta Posner, MD;  Location: [place];  Service: Vascular;  Laterality: Right;  . FOOT SURGERY     bilateral  . SHOULDER SURGERY     left  . TOE SURGERY     right foot  . TONSILLECTOMY    . TUBAL LIGATION    . UPPER GASTROINTESTINAL ENDOSCOPY  11-27-01, 08-18-08   Hiatal hernia, benign esophagus neoplasia, gastritis, tortuous esophagus 63 Maloney dilation performed    Social History   Social History  . Marital status: Widowed    Spouse name: N/A  . Number of children: 6  . Years of education: N/A   Occupational History  . 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 Smoking status: Former Smoker    Packs/day: 0.50    Years: 60.00    Types: Cigarettes    Quit date: 02/26/2014  . Smokeless tobacco: Never Used  . Alcohol use No  . Drug use: No  . Sexual activity: No   Other Topics Concern  .  Not on file   Social History Narrative   [place] to [place] twice a year to visit sister- 3-4 weeks travel there every year.   Pt does not get regular exercise    Family History  Problem Relation Age of Onset  . Heart attack Father     deceased at 50, MGF  . Colon cancer Father   . Prostate cancer Father   .  Heart disease Father   . Hyperlipidemia Father   . Heart disease Mother     deaceased at age 12  . Colitis Mother   . Stroke Mother   . Hyperlipidemia Mother   . Colitis Sister   . Hyperlipidemia Sister   . Other Brother     deceased age 59; war  . Hyperlipidemia Brother   . Breast cancer Maternal Aunt     great   . Lung cancer Paternal Aunt     great  . Vasculitis Son   . Hyperlipidemia Son   . Hypertension Son   . Heart attack Son   . Cancer Sister     unknown  . Hyperlipidemia Sister   . Hyperlipidemia Daughter   . Hypertension Daughter   . COPD Neg Hx   . Asthma Neg Hx     ROS: no fevers or chills, productive cough, hemoptysis, dysphasia, odynophagia, melena, hematochezia, dysuria, hematuria, rash, seizure activity, orthopnea, PND, pedal edema, claudication. Remaining systems are negative.  Physical Exam: Well-developed well-nourished in no acute distress.  Skin is warm and dry.  HEENT is normal.  Neck is supple.  Chest is clear to auscultation with normal expansion.  Cardiovascular exam is regular rate and rhythm.  Abdominal exam nontender or distended. No masses palpated. Extremities show no edema. neuro grossly intact  ECG  A/P  1 Hypertension-blood pressure controlled. Continue present medications.  2 coronary artery disease-continue aspirin, Plavix and statin. Patient will be treated medically as outlined in history of present illness. She is not having chest pain. Given age and pulmonary disease not candidate for CABG.  3 hyperlipidemia-continue statin. Check lipids and liver.   4 carotid artery  disease-followed by vascular surgery.  5 chronic combined systolic/diastolic congestive heart failure-patient is euvolemic on examination today. Continue present dose of Lasix. Check potassium and renal function.   6 Ischemic cardiomyopathy-continue beta blocker. I will not add an ACE inhibitor at this point as her blood pressure is borderline and she has some orthostatic symptoms.   Kirk Ruths, MD

## 2016-09-13 ENCOUNTER — Encounter: Payer: Self-pay | Admitting: Cardiology

## 2016-09-13 ENCOUNTER — Ambulatory Visit (INDEPENDENT_AMBULATORY_CARE_PROVIDER_SITE_OTHER): Payer: PPO | Admitting: Cardiology

## 2016-09-13 VITALS — BP 114/74 | HR 79 | Ht <= 58 in | Wt 144.2 lb

## 2016-09-13 DIAGNOSIS — E785 Hyperlipidemia, unspecified: Secondary | ICD-10-CM

## 2016-09-13 DIAGNOSIS — I2510 Atherosclerotic heart disease of native coronary artery without angina pectoris: Secondary | ICD-10-CM

## 2016-09-13 DIAGNOSIS — I10 Essential (primary) hypertension: Secondary | ICD-10-CM | POA: Diagnosis not present

## 2016-09-13 NOTE — Patient Instructions (Signed)
Medication Instructions:   NO CHANGE  Labwork:  Your physician recommends that you return for lab work WHEN FASTING AT [place]:  Your physician recommends that you schedule a follow-up appointment in: [place]

## 2016-09-17 ENCOUNTER — Telehealth: Payer: Self-pay | Admitting: Cardiology

## 2016-09-17 NOTE — Telephone Encounter (Signed)
Unable to reach pt or leave a message  

## 2016-09-17 NOTE — Telephone Encounter (Signed)
Pt is going to [employer] for 2 days. She wants to know if she can go without her oxygen for thiose 2 days?

## 2016-09-18 NOTE — Telephone Encounter (Signed)
Spoke with pt, aware according to [facility] she is to be on 2 liters of oxygen continuously. She can travel with the tank she has at her home and she can go to a location where she will be if she needs to replace the tank. Made patient aware she will need to call [facility] and let them know she is traveling. She reports currently she is only wearing the oxygen at night. Aware the orders for her oxygen are from dr Janee Morn office. Patient voiced understanding of above and said she will take care of things.

## 2016-09-18 NOTE — Telephone Encounter (Signed)
Follow up   Pt verbalized that she is returning call for rn about her O2

## 2016-09-19 ENCOUNTER — Other Ambulatory Visit: Payer: Self-pay | Admitting: Emergency Medicine

## 2016-09-19 MED ORDER — SERTRALINE HCL 50 MG PO TABS: 50.0000 mg | ORAL_TABLET | Freq: Every day | ORAL | 1 refills | Status: DC

## 2016-09-25 ENCOUNTER — Telehealth (HOSPITAL_COMMUNITY): Payer: Self-pay | Admitting: Cardiac Rehabilitation

## 2016-09-25 ENCOUNTER — Other Ambulatory Visit: Payer: PPO | Admitting: *Deleted

## 2016-09-25 DIAGNOSIS — E785 Hyperlipidemia, unspecified: Secondary | ICD-10-CM

## 2016-09-25 LAB — BASIC METABOLIC PANEL
BUN: 17 mg/dL (ref 7–25)
CHLORIDE: 104 mmol/L (ref 98–110)
CO2: 29 mmol/L (ref 20–31)
Calcium: 8.6 mg/dL (ref 8.6–10.4)
Creat: 0.93 mg/dL — ABNORMAL HIGH (ref 0.60–0.88)
Glucose, Bld: 91 mg/dL (ref 65–99)
POTASSIUM: 4.1 mmol/L (ref 3.5–5.3)
Sodium: 141 mmol/L (ref 135–146)

## 2016-09-25 LAB — HEPATIC FUNCTION PANEL
ALT: 6 U/L (ref 6–29)
AST: 15 U/L (ref 10–35)
Albumin: 3.8 g/dL (ref 3.6–5.1)
Alkaline Phosphatase: 79 U/L (ref 33–130)
BILIRUBIN DIRECT: 0.1 mg/dL (ref <=0.2)
BILIRUBIN INDIRECT: 0.5 mg/dL (ref 0.2–1.2)
TOTAL PROTEIN: 6.5 g/dL (ref 6.1–8.1)
Total Bilirubin: 0.6 mg/dL (ref 0.2–1.2)

## 2016-09-25 LAB — LIPID PANEL
Cholesterol: 144 mg/dL (ref <200)
HDL: 32 mg/dL — ABNORMAL LOW (ref >50)
LDL CALC: 71 mg/dL (ref <100)
TRIGLYCERIDES: 204 mg/dL — AB (ref <150)
Total CHOL/HDL Ratio: 4.5 Ratio (ref <5.0)
VLDL: 41 mg/dL — AB (ref <30)

## 2016-09-25 NOTE — Telephone Encounter (Signed)
pc to pt to discuss enrolling in cardiac rehab. Pt states she is exercising on her own with her daughter's assistance.

## 2016-09-26 ENCOUNTER — Encounter: Payer: Self-pay | Admitting: *Deleted

## 2016-09-27 ENCOUNTER — Telehealth: Payer: Self-pay | Admitting: Cardiology

## 2016-09-27 MED ORDER — POTASSIUM CHLORIDE ER 10 MEQ PO TBCR: 5.0000 meq | EXTENDED_RELEASE_TABLET | Freq: Every day | ORAL | 3 refills | Status: DC

## 2016-09-27 NOTE — Telephone Encounter (Signed)
Spoke with pt, she has potassium listed twice on her medication list. She has been taking 1/2 of a 10 meq tablet and potassium on recent lab work was normal. She will continue with the same. Refill sent to the pharmacy.

## 2016-09-27 NOTE — Telephone Encounter (Signed)
Please call,she says its a mix up in her Potassium medicine.

## 2016-11-28 ENCOUNTER — Ambulatory Visit (INDEPENDENT_AMBULATORY_CARE_PROVIDER_SITE_OTHER)
Admission: RE | Admit: 2016-11-28 | Discharge: 2016-11-28 | Disposition: A | Discharge status: Home / Self Care | Payer: PPO | Source: Ambulatory Visit | Attending: Internal Medicine | Admitting: Internal Medicine

## 2016-11-28 DIAGNOSIS — J84112 Idiopathic pulmonary fibrosis: Secondary | ICD-10-CM | POA: Diagnosis not present

## 2016-11-28 DIAGNOSIS — R918 Other nonspecific abnormal finding of lung field: Secondary | ICD-10-CM

## 2016-11-29 ENCOUNTER — Ambulatory Visit (INDEPENDENT_AMBULATORY_CARE_PROVIDER_SITE_OTHER): Payer: PPO | Admitting: Internal Medicine

## 2016-11-29 ENCOUNTER — Encounter: Payer: Self-pay | Admitting: Internal Medicine

## 2016-11-29 VITALS — BP 92/70 | HR 87 | Ht <= 58 in | Wt 145.6 lb

## 2016-11-29 DIAGNOSIS — J449 Chronic obstructive pulmonary disease, unspecified: Secondary | ICD-10-CM

## 2016-11-29 DIAGNOSIS — J84112 Idiopathic pulmonary fibrosis: Secondary | ICD-10-CM

## 2016-11-29 DIAGNOSIS — R911 Solitary pulmonary nodule: Secondary | ICD-10-CM | POA: Diagnosis not present

## 2016-11-29 DIAGNOSIS — J9611 Chronic respiratory failure with hypoxia: Secondary | ICD-10-CM | POA: Diagnosis not present

## 2016-11-29 NOTE — Assessment & Plan Note (Signed)
Nodule seems to have resolved on current chest CT, consistent with inflammatory etiology.

## 2016-11-29 NOTE — Assessment & Plan Note (Signed)
Progressive oxygen dependency. We will recorded 85% at rest on room air at this visit. I recommended they get an oximeter for home use and aim for saturation 90-94% as a guideline. We discussed effect of long oxygen cable from source. Discussed alternative portable devices. Plan-DME to consider light portable oxygen source, possibly concentrator.

## 2016-11-29 NOTE — Patient Instructions (Signed)
Order- Walk test on room air       Dx UIP  Order- DME [facility]- please evaluate for light portable O2 system   2-3L/M pulse  Use oximeter as needed- goal saturation is for saturation to usually range 90-94%  Ok to stop Anoro since it doesn't seem to help

## 2016-11-29 NOTE — Assessment & Plan Note (Signed)
She did not recognize any benefit from Anoro and seems to have little reactive airways disease. Her obstructive component is stabilized by the interstitial fibrotic changes.

## 2016-11-29 NOTE — Progress Notes (Signed)
Patient ID: Linda Kelly, female    DOB: [birth year], 83 y.o.   MRN: [id number]  HPI 06/07/11- 83 yoF 1/2 PPD smoker followed for pulmonary fibrosis/ ILD, COPD chronic hypoxic respiratory failure, complicated by CAD/ MI, PAD, hx lung nodule, h[place] exposure with long term f/u here.  CT chest 01/04/15- the pattern is most compatible with usual t interstitial pneumonia (UIP) CT chest 11/28/2016- Continued progression of fibrotic interstitial lung disease, Aortic atherosclerosis. Stable ectasia of the ascending thoracicaorta, maximum diameter 4.2 cm -----------------------------------------------------------------------------  11/29/2016-83 year old female former smoker followed for pulmonary fibrosis/ILD/UIP, COPD, CAD/history MI, PAD, history lung nodule, dizziness, [place] dust exposure with long-term follow-up here [employer] because of side effects O2 2 L sleep and as needed/ FOLLOWS FOR: Pt states that her SOB is worse than it was at her last visit. Pt uses O2 at 2 liters PRN -- pt uses when she has SOB and feeling tired. Here with daughter. Has oxygen at home but tries to go out without it. Anoro no help. Cough daily, mostly dry. CT chest 11/28/2016 IMPRESSION: 1. Continued progression of fibrotic interstitial lung disease characterized by confluent extensive subpleural reticulation and ground-glass attenuation, traction bronchiectasis and mild honeycombing, with a slight basilar predominance. These findings may be due to usual interstitial pneumonia (UIP), although the presence of mild-to-moderate air trapping raises the possibility of chronic hypersensitivity pneumonitis. 2. No acute consolidative airspace disease. Previously described nodular opacity in the medial basilar right upper lobe has resolved. No new or progressive pulmonary nodules. 3. Stable mild mediastinal lymphadenopathy, most consistent with benign reactive adenopathy. 4.  Aortic atherosclerosis. Stable ectasia of the ascending thoracic aorta, maximum diameter 4.2 cm. Recommend annual imaging followup by CTA or MRA. This recommendation follows 2010 ACCF/AHA/AATS/ACR/ASA/SCA/SCAI/SIR/STS/SVM Guidelines for the Diagnosis and Management of Patients with Thoracic Aortic Disease. Circulation. 2010; 121: [id number]. 5. Left main and 3 vessel coronary atherosclerosis.   Review of Systems-see HPI Constitutional:   No-   weight loss, night sweats, fevers, chills,+ fatigue, lassitude. HEENT:   No-  headaches, difficulty swallowing, tooth/dental problems, sore throat,       No-  sneezing, itching, ear ache, nasal congestion, post nasal drip,  CV:  No-   chest pain, orthopnea, PND, swelling in lower extremities, anasarca, dizziness, palpitations Resp: +shortness of breath with exertion or at rest.              No-   productive cough,   + non-productive cough,  No-  coughing up of blood.              No-   change in color of mucus.  No- wheezing.   Skin: No-   rash or lesions. GI:   heartburn, indigestion, no-abdominal pain, nausea, vomiting,  GU: MS:  No-   joint pain or swelling.  . Neuro- + dizziness Psych:  No- change in mood or affect. No depression or anxiety.  No memory loss.  Objective:   Physical Exam General- Alert, Oriented, Affect-appropriate, Distress- none acute,. Resting saturation on arrival 90%. Resting just before                  trying to walk test on room air saturation was found to be 85%.      Skin- rash-none, lesions- none, excoriation- none Lymphadenopathy- none Head- atraumatic            Eyes- Gross vision intact, PERRLA, conjunctivae clear secretions  Ears- Hearing, canals normal            Nose- Clear, No-Septal dev, mucus, polyps, erosion, perforation             Throat- Mallampati II , mucosa clear , drainage- none, tonsils- atrophic;  dentures Neck- flexible , trachea midline, no stridor , thyroid nl, carotid +R  bruit Chest - symmetrical excursion , unlabored           Heart/CV- RRR , no murmur , no gallop  , no rub, nl s1 s2                           - JVD- none , edema- none, stasis changes- none, varices- none           Lung- +crackles to scapulae, unlabored at rest,  wheeze- none, cough- none , dullness-none, rub- none, 94% RA           Chest wall-  Abd-  Br/ Gen/ Rectal- Not done, not indicated Extrem- cyanosis- none, clubbing- none, atrophy- none, strength- nl Neuro- +seems alert and sharp at this visit

## 2016-12-04 NOTE — Progress Notes (Signed)
HPI: FU carotid artery disease, CAD, HLD, pulmonary fibrosis. Her cardiac issues date back to 25 when she had an MI. She was treated with a balloon angioplasty at that time. Renal dopplers 12/12 showed stable 1-59 bilateral renal artery stenosis. Patient had a right carotid endarterectomy in May 2015. Patient was admitted with congestive heart failure October 2017. Echocardiogram October 2017 showed ejection fraction [id number], grade 2 diastolic dysfunction and mild to moderate mitral regurgitation. Cardiac catheterization October 2017 showed high-grade obstruction in the right iliac artery. There was severe multivessel coronary artery disease with occlusion of the right coronary artery, occlusion of the LAD, 99% distal circumflex and a very tortuous segment. Ejection fraction 35-45%. Medical therapy recommended. Surgical was not felt to be possible given her age and comorbidities. Chest CT January 2018 showed interstitial lung disease. Atherosclerosis noted and thoracic aortic aneurysm measuring 4.2 cm. Since last seen, patient has dyspnea on exertion. No orthopnea, PND, pedal edema, exertional chest pain or syncope. She complains of significant weakness and discomfort in her lower extremities for 2 weeks.  Current Outpatient Prescriptions  Medication Sig Dispense Refill  . aspirin EC 81 MG tablet Take 81 mg by mouth at bedtime.     . cholecalciferol (VITAMIN D) 1000 UNITS tablet Take 1,000 Units by mouth at bedtime.     . clopidogrel (PLAVIX) 75 MG tablet Take 1 tablet (75 mg total) by mouth daily. -- Office visit needed for further refills 90 tablet 0  . folic acid (FOLVITE) A999333 MCG tablet Take 400 mcg by mouth at bedtime.     . furosemide (LASIX) 40 MG tablet Take 1 tablet (40 mg total) by mouth daily. 30 tablet 6  . loratadine (CLARITIN) 10 MG tablet Take 10 mg by mouth daily as needed for allergies.    . metoprolol succinate (TOPROL XL) 25 MG 24 hr tablet Take 1 tablet (25 mg total) by mouth  daily. 30 tablet 9  . montelukast (SINGULAIR) 10 MG tablet Take 1 tablet (10 mg total) by mouth at bedtime. 90 tablet 3  . nitroGLYCERIN (NITROSTAT) 0.4 MG SL tablet Place 1 tablet (0.4 mg total) under the tongue every 5 (five) minutes x 3 doses as needed for chest pain. 25 tablet 3  . Omega-3 Fatty Acids (FISH OIL) 1000 MG CAPS Take 1,000 mg by mouth at bedtime.     . OXYGEN Inhale 2.5 L into the lungs continuous.    . potassium chloride (KLOR-CON 10) 10 MEQ tablet Take 0.5 tablets (5 mEq total) by mouth daily. 45 tablet 3  . ranitidine (ZANTAC) 150 MG tablet TAKE 1 TABLET BY MOUTH TWICE DAILY (Patient taking differently: Take 150 mg by mouth 2 (two) times daily. ) 180 tablet 3  . sertraline (ZOLOFT) 50 MG tablet Take 1 tablet (50 mg total) by mouth daily. 90 tablet 1  . simvastatin (ZOCOR) 40 MG tablet Take 1 tablet (40 mg total) by mouth daily. (Patient taking differently: Take 40 mg by mouth at bedtime. ) 90 tablet 3  . tolterodine (DETROL) 2 MG tablet Take 1 tablet (2 mg total) by mouth at bedtime. 90 tablet 1  . vitamin E 400 UNIT capsule Take 400 Units by mouth at bedtime.      No current facility-administered medications for this visit.      Past Medical History:  Diagnosis Date  . Anxiety   . Anxiety and depression   . Arthritis   . Benign neoplasm of esophagus   . CAD (  coronary artery disease)   . Cancer [facility])    Skin cancer on back  . Carotid artery disease ([place])   . CHF (congestive heart failure) ([place])   . Chronic gastritis   . Colon, diverticulosis   . COPD (chronic obstructive pulmonary disease) ([place])   . Depression   . GERD (gastroesophageal reflux disease)   . Hiatal hernia   . High frequency hearing loss   . Hyperlipidemia   . IBS (irritable bowel syndrome)   . Idiopathic pulmonary fibrosis   . Internal hemorrhoid   . MI (myocardial infarction) 1985  . Other nonthrombocytopenic purpuras   . PONV (postoperative nausea and vomiting)   . PVD (peripheral vascular  disease) ([place])   . Shortness of breath    with exertion  . Solitary pulmonary nodule   . Stroke [facility])    Hx: of several mini -strokes  . Tinnitus    chronic  . Vertigo     Past Surgical History:  Procedure Laterality Date  . ANGIOPLASTY  1997, 98, 99  . BREAST SURGERY     Hx; of biopsy  . CARDIAC CATHETERIZATION N/A 08/10/2016   Procedure: Left Heart Cath and Coronary Angiography;  Surgeon: Belva Crome, MD;  Location: [place] [facility];  Service: Cardiovascular;  Laterality: N/A;  . CAROTID ENDARTERECTOMY     left side x 3, saphenous vein graft from left leg  . CATARACT EXTRACTION W/ INTRAOCULAR LENS  IMPLANT, BILATERAL    . COLON SURGERY    . COLONOSCOPY  11-27-01, 10-30-05, 05-24-11   diverticulosis, hemorrhoids  . DENTAL SURGERY     2012   . ENDARTERECTOMY Right 03/03/2014   Procedure: RIGHT CAROTID ENDARTERECTOMY WITH PATCH ANGIOPLASTY;  Surgeon: Rosetta Posner, MD;  Location: [place];  Service: Vascular;  Laterality: Right;  . FOOT SURGERY     bilateral  . SHOULDER SURGERY     left  . TOE SURGERY     right foot  . TONSILLECTOMY    . TUBAL LIGATION    . UPPER GASTROINTESTINAL ENDOSCOPY  11-27-01, 08-18-08   Hiatal hernia, benign esophagus neoplasia, gastritis, tortuous esophagus 66 Maloney dilation performed    Social History   Social History  . Marital status: Widowed    Spouse name: N/A  . Number of children: 6  . Years of education: N/A   Occupational History  . 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 Smoking status: Former Smoker    Packs/day: 0.50    Years: 60.00    Types: Cigarettes    Quit date: 02/26/2014  . Smokeless tobacco: Never Used  . Alcohol use No  . Drug use: No  . Sexual activity: No   Other Topics Concern  . Not on file   Social History Narrative   [place] to [place] twice a year to visit sister- 3-4 weeks travel there every year.   Pt does not get regular  exercise    Family History  Problem Relation Age of Onset  . Heart attack Father     deceased at 18, MGF  . Colon cancer Father   . Prostate cancer Father   . Heart disease Father   . Hyperlipidemia Father   . Heart disease Mother     deaceased at age 52  . Colitis Mother   . Stroke Mother   . Hyperlipidemia Mother   . Colitis  Sister   . Hyperlipidemia Sister   . Other Brother     deceased age 65; war  . Hyperlipidemia Brother   . Breast cancer Maternal Aunt     great   . Lung cancer Paternal Aunt     great  . Vasculitis Son   . Hyperlipidemia Son   . Hypertension Son   . Heart attack Son   . Cancer Sister     unknown  . Hyperlipidemia Sister   . Hyperlipidemia Daughter   . Hypertension Daughter   . COPD Neg Hx   . Asthma Neg Hx     ROS: Weakness and pain in extremities but no fevers or chills, productive cough, hemoptysis, dysphasia, odynophagia, melena, hematochezia, dysuria, hematuria, rash, seizure activity, orthopnea, PND, pedal edema, claudication. Remaining systems are negative.  Physical Exam: Well-developed well-nourished in no acute distress.  Skin is warm and dry.  HEENT is normal.  Neck is supple.  Chest diffuse dry crackles Cardiovascular exam is regular rate and rhythm.  Abdominal exam nontender or distended. No masses palpated. Extremities show no edema. neuro grossly intact   A/P  1 Coronary artery disease-continue aspirin, Plavix.   2 hypertension-blood pressure controlled. Continue present medications.  3 hyperlipidemia-patient complains of pain and weakness in extremities. Question related to statin. I will discontinue Zocor. Check creatinine kinase. If symptoms improve we will try a different statin.  4 carotid artery disease-followed by vascular surgery.  5 chronic combined systolic/diastolic congestive heart failure-continue present dose of diuretics. check potassium and renal function.   6 ischemic cardiomyopathy- continue beta  blocker. I have not added an ACE inhibitor because of borderline blood pressure and history of orthostasis.  7 thoracic aortic aneurysm-noted on CT. She is not a candidate for surgical intervention and it is only mildly dilated. Would not pursue further imaging.   Kirk Ruths, MD

## 2016-12-08 ENCOUNTER — Other Ambulatory Visit: Payer: Self-pay | Admitting: Internal Medicine

## 2016-12-10 ENCOUNTER — Ambulatory Visit (INDEPENDENT_AMBULATORY_CARE_PROVIDER_SITE_OTHER): Payer: PPO | Admitting: Cardiology

## 2016-12-10 ENCOUNTER — Encounter: Payer: Self-pay | Admitting: Cardiology

## 2016-12-10 VITALS — BP 113/70 | HR 82 | Ht <= 58 in | Wt 147.4 lb

## 2016-12-10 DIAGNOSIS — I2510 Atherosclerotic heart disease of native coronary artery without angina pectoris: Secondary | ICD-10-CM

## 2016-12-10 DIAGNOSIS — E7800 Pure hypercholesterolemia, unspecified: Secondary | ICD-10-CM | POA: Diagnosis not present

## 2016-12-10 DIAGNOSIS — I5042 Chronic combined systolic (congestive) and diastolic (congestive) heart failure: Secondary | ICD-10-CM

## 2016-12-10 DIAGNOSIS — R29898 Other symptoms and signs involving the musculoskeletal system: Secondary | ICD-10-CM

## 2016-12-10 NOTE — Patient Instructions (Addendum)
Medication Instructions:   STOP SIMVASTATIN=AFTER 4 WEEKS CALL THE OFFICE TO DISCUSS SYMPTOMS  Labwork:  Your physician recommends that you HAVE LAB WORK TOMORROW=[address]= 3RD FLOOR  Follow-Up:  Your physician recommends that you schedule a follow-up appointment in: [place]

## 2016-12-11 ENCOUNTER — Other Ambulatory Visit: Payer: Self-pay

## 2016-12-13 ENCOUNTER — Other Ambulatory Visit (INDEPENDENT_AMBULATORY_CARE_PROVIDER_SITE_OTHER): Payer: PPO

## 2016-12-13 DIAGNOSIS — R29898 Other symptoms and signs involving the musculoskeletal system: Secondary | ICD-10-CM

## 2016-12-14 LAB — BASIC METABOLIC PANEL
BUN/Creatinine Ratio: 17 (ref 12–28)
BUN: 15 mg/dL (ref 8–27)
CALCIUM: 8.9 mg/dL (ref 8.7–10.3)
CHLORIDE: 97 mmol/L (ref 96–106)
CO2: 28 mmol/L (ref 18–29)
Creatinine, Ser: 0.88 mg/dL (ref 0.57–1.00)
GFR calc Af Amer: 69 mL/min/{1.73_m2} (ref >59)
GFR calc non Af Amer: 60 mL/min/{1.73_m2} (ref >59)
Glucose: 86 mg/dL (ref 65–99)
POTASSIUM: 4.2 mmol/L (ref 3.5–5.2)
Sodium: 142 mmol/L (ref 134–144)

## 2016-12-14 LAB — CK: Total CK: 39 U/L (ref 24–173)

## 2016-12-17 ENCOUNTER — Telehealth: Payer: Self-pay | Admitting: Emergency Medicine

## 2016-12-17 NOTE — Telephone Encounter (Signed)
PA has been started for Plavix, may take 24-72 hours to receive response through CoverMyMed

## 2016-12-18 NOTE — Telephone Encounter (Signed)
Received approval for Plavix, 12/17/16 - 10/28/2017. Will not cover Clopidogrel. Pharmacy notified.

## 2017-01-03 ENCOUNTER — Inpatient Hospital Stay (HOSPITAL_COMMUNITY)
Admission: EM | Admit: 2017-01-03 | Discharge: 2017-01-09 | DRG: 280 | Disposition: A | Discharge status: Home Health | Payer: PPO | Attending: Internal Medicine | Admitting: Internal Medicine

## 2017-01-03 ENCOUNTER — Emergency Department (HOSPITAL_COMMUNITY): Payer: PPO

## 2017-01-03 ENCOUNTER — Ambulatory Visit: Payer: Self-pay | Admitting: Internal Medicine

## 2017-01-03 ENCOUNTER — Encounter (HOSPITAL_COMMUNITY): Payer: Self-pay | Admitting: Emergency Medicine

## 2017-01-03 DIAGNOSIS — I248 Other forms of acute ischemic heart disease: Secondary | ICD-10-CM

## 2017-01-03 DIAGNOSIS — I2510 Atherosclerotic heart disease of native coronary artery without angina pectoris: Secondary | ICD-10-CM | POA: Diagnosis present

## 2017-01-03 DIAGNOSIS — Z7982 Long term (current) use of aspirin: Secondary | ICD-10-CM

## 2017-01-03 DIAGNOSIS — F411 Generalized anxiety disorder: Secondary | ICD-10-CM | POA: Diagnosis not present

## 2017-01-03 DIAGNOSIS — I959 Hypotension, unspecified: Secondary | ICD-10-CM | POA: Diagnosis present

## 2017-01-03 DIAGNOSIS — N179 Acute kidney failure, unspecified: Secondary | ICD-10-CM | POA: Diagnosis present

## 2017-01-03 DIAGNOSIS — Z87891 Personal history of nicotine dependence: Secondary | ICD-10-CM

## 2017-01-03 DIAGNOSIS — R0602 Shortness of breath: Secondary | ICD-10-CM

## 2017-01-03 DIAGNOSIS — I701 Atherosclerosis of renal artery: Secondary | ICD-10-CM | POA: Diagnosis present

## 2017-01-03 DIAGNOSIS — J9621 Acute and chronic respiratory failure with hypoxia: Secondary | ICD-10-CM | POA: Diagnosis present

## 2017-01-03 DIAGNOSIS — K589 Irritable bowel syndrome without diarrhea: Secondary | ICD-10-CM | POA: Diagnosis present

## 2017-01-03 DIAGNOSIS — I739 Peripheral vascular disease, unspecified: Secondary | ICD-10-CM

## 2017-01-03 DIAGNOSIS — I2582 Chronic total occlusion of coronary artery: Secondary | ICD-10-CM | POA: Diagnosis present

## 2017-01-03 DIAGNOSIS — Z9981 Dependence on supplemental oxygen: Secondary | ICD-10-CM

## 2017-01-03 DIAGNOSIS — I255 Ischemic cardiomyopathy: Secondary | ICD-10-CM | POA: Diagnosis present

## 2017-01-03 DIAGNOSIS — Z8249 Family history of ischemic heart disease and other diseases of the circulatory system: Secondary | ICD-10-CM

## 2017-01-03 DIAGNOSIS — E785 Hyperlipidemia, unspecified: Secondary | ICD-10-CM | POA: Diagnosis present

## 2017-01-03 DIAGNOSIS — I214 Non-ST elevation (NSTEMI) myocardial infarction: Secondary | ICD-10-CM | POA: Diagnosis present

## 2017-01-03 DIAGNOSIS — T380X5A Adverse effect of glucocorticoids and synthetic analogues, initial encounter: Secondary | ICD-10-CM | POA: Diagnosis present

## 2017-01-03 DIAGNOSIS — Z7189 Other specified counseling: Secondary | ICD-10-CM | POA: Diagnosis not present

## 2017-01-03 DIAGNOSIS — J309 Allergic rhinitis, unspecified: Secondary | ICD-10-CM | POA: Diagnosis present

## 2017-01-03 DIAGNOSIS — I252 Old myocardial infarction: Secondary | ICD-10-CM

## 2017-01-03 DIAGNOSIS — Z961 Presence of intraocular lens: Secondary | ICD-10-CM | POA: Diagnosis present

## 2017-01-03 DIAGNOSIS — D649 Anemia, unspecified: Secondary | ICD-10-CM | POA: Diagnosis present

## 2017-01-03 DIAGNOSIS — I340 Nonrheumatic mitral (valve) insufficiency: Secondary | ICD-10-CM | POA: Diagnosis present

## 2017-01-03 DIAGNOSIS — J189 Pneumonia, unspecified organism: Secondary | ICD-10-CM | POA: Diagnosis not present

## 2017-01-03 DIAGNOSIS — J8489 Other specified interstitial pulmonary diseases: Secondary | ICD-10-CM | POA: Diagnosis present

## 2017-01-03 DIAGNOSIS — H9190 Unspecified hearing loss, unspecified ear: Secondary | ICD-10-CM | POA: Diagnosis present

## 2017-01-03 DIAGNOSIS — J84112 Idiopathic pulmonary fibrosis: Secondary | ICD-10-CM | POA: Diagnosis present

## 2017-01-03 DIAGNOSIS — D72829 Elevated white blood cell count, unspecified: Secondary | ICD-10-CM | POA: Diagnosis present

## 2017-01-03 DIAGNOSIS — I700 Atherosclerosis of aorta: Secondary | ICD-10-CM | POA: Diagnosis not present

## 2017-01-03 DIAGNOSIS — J449 Chronic obstructive pulmonary disease, unspecified: Secondary | ICD-10-CM | POA: Diagnosis not present

## 2017-01-03 DIAGNOSIS — R748 Abnormal levels of other serum enzymes: Secondary | ICD-10-CM

## 2017-01-03 DIAGNOSIS — J440 Chronic obstructive pulmonary disease with acute lower respiratory infection: Secondary | ICD-10-CM | POA: Diagnosis present

## 2017-01-03 DIAGNOSIS — Z9861 Coronary angioplasty status: Secondary | ICD-10-CM | POA: Diagnosis not present

## 2017-01-03 DIAGNOSIS — Z803 Family history of malignant neoplasm of breast: Secondary | ICD-10-CM

## 2017-01-03 DIAGNOSIS — D638 Anemia in other chronic diseases classified elsewhere: Secondary | ICD-10-CM | POA: Diagnosis present

## 2017-01-03 DIAGNOSIS — R739 Hyperglycemia, unspecified: Secondary | ICD-10-CM | POA: Diagnosis present

## 2017-01-03 DIAGNOSIS — Z9842 Cataract extraction status, left eye: Secondary | ICD-10-CM

## 2017-01-03 DIAGNOSIS — F329 Major depressive disorder, single episode, unspecified: Secondary | ICD-10-CM | POA: Diagnosis present

## 2017-01-03 DIAGNOSIS — I509 Heart failure, unspecified: Secondary | ICD-10-CM | POA: Diagnosis not present

## 2017-01-03 DIAGNOSIS — I21A1 Myocardial infarction type 2: Secondary | ICD-10-CM | POA: Diagnosis present

## 2017-01-03 DIAGNOSIS — R778 Other specified abnormalities of plasma proteins: Secondary | ICD-10-CM

## 2017-01-03 DIAGNOSIS — I712 Thoracic aortic aneurysm, without rupture: Secondary | ICD-10-CM | POA: Diagnosis present

## 2017-01-03 DIAGNOSIS — I5041 Acute combined systolic (congestive) and diastolic (congestive) heart failure: Secondary | ICD-10-CM

## 2017-01-03 DIAGNOSIS — Z66 Do not resuscitate: Secondary | ICD-10-CM | POA: Diagnosis present

## 2017-01-03 DIAGNOSIS — R6889 Other general symptoms and signs: Secondary | ICD-10-CM | POA: Diagnosis present

## 2017-01-03 DIAGNOSIS — R7989 Other specified abnormal findings of blood chemistry: Secondary | ICD-10-CM

## 2017-01-03 DIAGNOSIS — Z85828 Personal history of other malignant neoplasm of skin: Secondary | ICD-10-CM

## 2017-01-03 DIAGNOSIS — Z9841 Cataract extraction status, right eye: Secondary | ICD-10-CM

## 2017-01-03 DIAGNOSIS — Z515 Encounter for palliative care: Secondary | ICD-10-CM

## 2017-01-03 DIAGNOSIS — Z823 Family history of stroke: Secondary | ICD-10-CM

## 2017-01-03 DIAGNOSIS — I5042 Chronic combined systolic (congestive) and diastolic (congestive) heart failure: Secondary | ICD-10-CM | POA: Diagnosis present

## 2017-01-03 DIAGNOSIS — J8410 Pulmonary fibrosis, unspecified: Secondary | ICD-10-CM | POA: Diagnosis not present

## 2017-01-03 DIAGNOSIS — I110 Hypertensive heart disease with heart failure: Secondary | ICD-10-CM | POA: Diagnosis present

## 2017-01-03 DIAGNOSIS — K219 Gastro-esophageal reflux disease without esophagitis: Secondary | ICD-10-CM | POA: Diagnosis present

## 2017-01-03 DIAGNOSIS — Z79899 Other long term (current) drug therapy: Secondary | ICD-10-CM

## 2017-01-03 DIAGNOSIS — I5043 Acute on chronic combined systolic (congestive) and diastolic (congestive) heart failure: Secondary | ICD-10-CM | POA: Diagnosis present

## 2017-01-03 DIAGNOSIS — I4581 Long QT syndrome: Secondary | ICD-10-CM | POA: Diagnosis present

## 2017-01-03 DIAGNOSIS — Z7902 Long term (current) use of antithrombotics/antiplatelets: Secondary | ICD-10-CM

## 2017-01-03 DIAGNOSIS — R0689 Other abnormalities of breathing: Secondary | ICD-10-CM | POA: Diagnosis not present

## 2017-01-03 DIAGNOSIS — R0682 Tachypnea, not elsewhere classified: Secondary | ICD-10-CM

## 2017-01-03 DIAGNOSIS — I10 Essential (primary) hypertension: Secondary | ICD-10-CM | POA: Diagnosis not present

## 2017-01-03 DIAGNOSIS — Y95 Nosocomial condition: Secondary | ICD-10-CM | POA: Diagnosis present

## 2017-01-03 DIAGNOSIS — H9319 Tinnitus, unspecified ear: Secondary | ICD-10-CM | POA: Diagnosis present

## 2017-01-03 DIAGNOSIS — Z800 Family history of malignant neoplasm of digestive organs: Secondary | ICD-10-CM

## 2017-01-03 DIAGNOSIS — Z801 Family history of malignant neoplasm of trachea, bronchus and lung: Secondary | ICD-10-CM

## 2017-01-03 DIAGNOSIS — Z8673 Personal history of transient ischemic attack (TIA), and cerebral infarction without residual deficits: Secondary | ICD-10-CM

## 2017-01-03 DIAGNOSIS — F419 Anxiety disorder, unspecified: Secondary | ICD-10-CM | POA: Diagnosis present

## 2017-01-03 LAB — COMPREHENSIVE METABOLIC PANEL
ALBUMIN: 3.4 g/dL — AB (ref 3.5–5.0)
ALT: 9 U/L — ABNORMAL LOW (ref 14–54)
ANION GAP: 9 (ref 5–15)
AST: 23 U/L (ref 15–41)
Alkaline Phosphatase: 63 U/L (ref 38–126)
BILIRUBIN TOTAL: 1.1 mg/dL (ref 0.3–1.2)
BUN: 22 mg/dL — ABNORMAL HIGH (ref 6–20)
CO2: 26 mmol/L (ref 22–32)
Calcium: 8.6 mg/dL — ABNORMAL LOW (ref 8.9–10.3)
Chloride: 103 mmol/L (ref 101–111)
Creatinine, Ser: 0.99 mg/dL (ref 0.44–1.00)
GFR calc non Af Amer: 50 mL/min — ABNORMAL LOW (ref >60)
GFR, EST AFRICAN AMERICAN: 58 mL/min — AB (ref >60)
GLUCOSE: 116 mg/dL — AB (ref 65–99)
POTASSIUM: 4.2 mmol/L (ref 3.5–5.1)
Sodium: 138 mmol/L (ref 135–145)
TOTAL PROTEIN: 7.2 g/dL (ref 6.5–8.1)

## 2017-01-03 LAB — CBC WITH DIFFERENTIAL/PLATELET
BASOS PCT: 0 %
Basophils Absolute: 0 10*3/uL (ref 0.0–0.1)
EOS ABS: 0 10*3/uL (ref 0.0–0.7)
Eosinophils Relative: 0 %
HEMATOCRIT: 31.5 % — AB (ref 36.0–46.0)
Hemoglobin: 9.9 g/dL — ABNORMAL LOW (ref 12.0–15.0)
Lymphocytes Relative: 7 %
Lymphs Abs: 1.2 10*3/uL (ref 0.7–4.0)
MCH: 30.4 pg (ref 26.0–34.0)
MCHC: 31.4 g/dL (ref 30.0–36.0)
MCV: 96.6 fL (ref 78.0–100.0)
MONO ABS: 1.7 10*3/uL — AB (ref 0.1–1.0)
MONOS PCT: 10 %
NEUTROS ABS: 13.6 10*3/uL — AB (ref 1.7–7.7)
Neutrophils Relative %: 83 %
Platelets: 211 10*3/uL (ref 150–400)
RBC: 3.26 MIL/uL — ABNORMAL LOW (ref 3.87–5.11)
RDW: 15.4 % (ref 11.5–15.5)
WBC: 16.4 10*3/uL — ABNORMAL HIGH (ref 4.0–10.5)

## 2017-01-03 LAB — I-STAT ARTERIAL BLOOD GAS, ED
Acid-Base Excess: 1 mmol/L (ref 0.0–2.0)
BICARBONATE: 25 mmol/L (ref 20.0–28.0)
O2 SAT: 98 %
PCO2 ART: 38 mmHg (ref 32.0–48.0)
PO2 ART: 105 mmHg (ref 83.0–108.0)
Patient temperature: 98.6
TCO2: 26 mmol/L (ref 0–100)
pH, Arterial: 7.427 (ref 7.350–7.450)

## 2017-01-03 LAB — TROPONIN I
TROPONIN I: 3.19 ng/mL — AB (ref <0.03)
Troponin I: 4.04 ng/mL (ref <0.03)

## 2017-01-03 LAB — HEPARIN LEVEL (UNFRACTIONATED): Heparin Unfractionated: 0.1 IU/mL — ABNORMAL LOW (ref 0.30–0.70)

## 2017-01-03 LAB — MRSA PCR SCREENING: MRSA by PCR: NEGATIVE

## 2017-01-03 LAB — I-STAT TROPONIN, ED: Troponin i, poc: 2.15 ng/mL (ref 0.00–0.08)

## 2017-01-03 LAB — BRAIN NATRIURETIC PEPTIDE: B Natriuretic Peptide: 960.4 pg/mL — ABNORMAL HIGH (ref 0.0–100.0)

## 2017-01-03 MED ORDER — HEPARIN (PORCINE) IN NACL 100-0.45 UNIT/ML-% IJ SOLN
1300.0000 [IU]/h | INTRAMUSCULAR | Status: DC
  Administered 2017-01-03: 700 [IU]/h via INTRAVENOUS
  Administered 2017-01-04: 1100 [IU]/h via INTRAVENOUS
  Administered 2017-01-05: 1300 [IU]/h via INTRAVENOUS
  Filled 2017-01-03 (×3): qty 250

## 2017-01-03 MED ORDER — CLOPIDOGREL BISULFATE 75 MG PO TABS
75.0000 mg | ORAL_TABLET | Freq: Every day | ORAL | Status: DC
  Administered 2017-01-04 – 2017-01-09 (×6): 75 mg via ORAL
  Filled 2017-01-03 (×6): qty 1

## 2017-01-03 MED ORDER — ASPIRIN EC 81 MG PO TBEC
81.0000 mg | DELAYED_RELEASE_TABLET | Freq: Every day | ORAL | Status: DC
  Administered 2017-01-04 – 2017-01-08 (×5): 81 mg via ORAL
  Filled 2017-01-03 (×5): qty 1

## 2017-01-03 MED ORDER — MONTELUKAST SODIUM 10 MG PO TABS
10.0000 mg | ORAL_TABLET | Freq: Every day | ORAL | Status: DC
  Administered 2017-01-03 – 2017-01-08 (×6): 10 mg via ORAL
  Filled 2017-01-03 (×6): qty 1

## 2017-01-03 MED ORDER — LEVOFLOXACIN IN D5W 500 MG/100ML IV SOLN
500.0000 mg | INTRAVENOUS | Status: DC
  Administered 2017-01-04 – 2017-01-05 (×2): 500 mg via INTRAVENOUS
  Filled 2017-01-03 (×2): qty 100

## 2017-01-03 MED ORDER — FUROSEMIDE 10 MG/ML IJ SOLN
40.0000 mg | Freq: Two times a day (BID) | INTRAMUSCULAR | Status: DC
  Administered 2017-01-03 – 2017-01-06 (×6): 40 mg via INTRAVENOUS
  Filled 2017-01-03 (×6): qty 4

## 2017-01-03 MED ORDER — SODIUM CHLORIDE 0.9% FLUSH
3.0000 mL | INTRAVENOUS | Status: DC | PRN
  Administered 2017-01-06: 3 mL via INTRAVENOUS
  Filled 2017-01-03: qty 3

## 2017-01-03 MED ORDER — HEPARIN BOLUS VIA INFUSION
1500.0000 [IU] | Freq: Once | INTRAVENOUS | Status: AC
  Administered 2017-01-03: 1500 [IU] via INTRAVENOUS
  Filled 2017-01-03: qty 1500

## 2017-01-03 MED ORDER — SERTRALINE HCL 50 MG PO TABS
50.0000 mg | ORAL_TABLET | Freq: Every day | ORAL | Status: DC
  Administered 2017-01-04 – 2017-01-09 (×6): 50 mg via ORAL
  Filled 2017-01-03 (×6): qty 1

## 2017-01-03 MED ORDER — POTASSIUM CHLORIDE ER 10 MEQ PO TBCR
5.0000 meq | EXTENDED_RELEASE_TABLET | Freq: Every day | ORAL | Status: DC
  Filled 2017-01-03: qty 1

## 2017-01-03 MED ORDER — FAMOTIDINE 20 MG PO TABS
10.0000 mg | ORAL_TABLET | Freq: Every day | ORAL | Status: DC
  Administered 2017-01-04 – 2017-01-09 (×6): 10 mg via ORAL
  Filled 2017-01-03 (×6): qty 1

## 2017-01-03 MED ORDER — OMEGA-3-ACID ETHYL ESTERS 1 G PO CAPS
1.0000 g | ORAL_CAPSULE | Freq: Two times a day (BID) | ORAL | Status: DC
  Administered 2017-01-03 – 2017-01-09 (×12): 1 g via ORAL
  Filled 2017-01-03 (×12): qty 1

## 2017-01-03 MED ORDER — ONDANSETRON HCL 4 MG/2ML IJ SOLN: 4.0000 mg | Freq: Four times a day (QID) | INTRAMUSCULAR | Status: DC | PRN

## 2017-01-03 MED ORDER — ENOXAPARIN SODIUM 40 MG/0.4ML ~~LOC~~ SOLN: 40.0000 mg | SUBCUTANEOUS | Status: DC

## 2017-01-03 MED ORDER — SODIUM CHLORIDE 0.9 % IV SOLN: 250.0000 mL | INTRAVENOUS | Status: DC | PRN

## 2017-01-03 MED ORDER — ASPIRIN 81 MG PO CHEW
324.0000 mg | CHEWABLE_TABLET | Freq: Once | ORAL | Status: AC
  Administered 2017-01-03: 324 mg via ORAL
  Filled 2017-01-03: qty 4

## 2017-01-03 MED ORDER — ACETAMINOPHEN 325 MG PO TABS
650.0000 mg | ORAL_TABLET | ORAL | Status: DC | PRN
  Administered 2017-01-04: 650 mg via ORAL
  Filled 2017-01-03: qty 2

## 2017-01-03 MED ORDER — SODIUM CHLORIDE 0.9% FLUSH
3.0000 mL | Freq: Two times a day (BID) | INTRAVENOUS | Status: DC
  Administered 2017-01-03 – 2017-01-07 (×8): 3 mL via INTRAVENOUS

## 2017-01-03 MED ORDER — HEPARIN BOLUS VIA INFUSION
3000.0000 [IU] | Freq: Once | INTRAVENOUS | Status: AC
  Administered 2017-01-03: 3000 [IU] via INTRAVENOUS
  Filled 2017-01-03: qty 3000

## 2017-01-03 MED ORDER — FUROSEMIDE 10 MG/ML IJ SOLN
40.0000 mg | Freq: Once | INTRAMUSCULAR | Status: AC
  Administered 2017-01-03: 40 mg via INTRAVENOUS
  Filled 2017-01-03: qty 4

## 2017-01-03 MED ORDER — LEVOFLOXACIN IN D5W 500 MG/100ML IV SOLN
500.0000 mg | Freq: Once | INTRAVENOUS | Status: AC
  Administered 2017-01-03: 500 mg via INTRAVENOUS
  Filled 2017-01-03: qty 100

## 2017-01-03 MED ORDER — POTASSIUM CHLORIDE 20 MEQ/15ML (10%) PO SOLN
5.0000 meq | Freq: Every day | ORAL | Status: DC
  Administered 2017-01-03 – 2017-01-09 (×7): 5.0667 meq via ORAL
  Filled 2017-01-03 (×7): qty 15

## 2017-01-03 NOTE — Progress Notes (Signed)
Pt arrived by EMS on CPAP. Pt initially placed on BIPAP for less than 10 minutes and titrated to partial rebreather. Pt breathing comfortably at this time with SATs 100%. WIll titrate O2 as able.

## 2017-01-03 NOTE — ED Notes (Signed)
EKG given to Dr. Yao.  

## 2017-01-03 NOTE — Progress Notes (Signed)
Pharmacy Antibiotic Note  Linda Kelly is a 83 y.o. female admitted on 01/03/2017 with acute bronchitis.  Pharmacy has been consulted for levofloxacin dosing.  Patient is afebrile on admission with an elevated of WBC 16.4. CrCL ~34 mL/min, with serum creatinine slightly elevated above baseline. QTc 524, not on any other QTc prolonging medications currently.   Plan: Levofloxacin 500mg  IV every 24 hours Monitor renal function, QTc, clinical progress F/U ability to take PO medications   Height: 4\' 10"  (147.3 cm) Weight: 154 lb 1.6 oz (69.9 kg) IBW/kg (Calculated) : 40.9  Temp (24hrs), Avg:98.6 F (37 C), Min:98.6 F (37 C), Linda:98.6 F (37 C)   Recent Labs Lab 01/03/17 1117  WBC 16.4*  CREATININE 0.99    Estimated Creatinine Clearance: 33.8 mL/min (by C-G formula based on SCr of 0.99 mg/dL).    No Known Allergies  Antimicrobials this admission: 3/8 levofloxacin >>   Dose adjustments this admission: N/A  Microbiology results: None  Thank you for allowing pharmacy to be a part of this patient's care.  Demetrius Charity, PharmD Acute Care Pharmacy Resident  Pager: [phone] 01/03/2017

## 2017-01-03 NOTE — Progress Notes (Addendum)
ANTICOAGULATION CONSULT NOTE - Follow Up Consult  Pharmacy Consult for heparin Indication: chest pain/ACS  No Known Allergies  Patient Measurements: Height: 4\' 10"  (147.3 cm) Weight: 146 lb 2.6 oz (66.3 kg) IBW/kg (Calculated) : 40.9 Heparin Dosing Weight: 56.8 kg  Vital Signs: Temp: 99.1 F (37.3 C) (03/08 1946) Temp Source: Axillary (03/08 1946) BP: 111/69 (03/08 1946) Pulse Rate: 89 (03/08 1946)  Labs:  Recent Labs  01/03/17 1117 01/03/17 1514 01/03/17 1932  HGB 9.9*  --   --   HCT 31.5*  --   --   PLT 211  --   --   HEPARINUNFRC  --   --  0.10*  CREATININE 0.99  --   --   TROPONINI  --  4.04*  --    Estimated Creatinine Clearance: 32.9 mL/min (by C-G formula based on SCr of 0.99 mg/dL).  Medications:  Prescriptions Prior to Admission  Medication Sig Dispense Refill Last Dose  . aspirin EC 81 MG tablet Take 81 mg by mouth at bedtime.    01/03/2017 at Unknown time  . cholecalciferol (VITAMIN D) 1000 UNITS tablet Take 1,000 Units by mouth at bedtime.    01/02/2017 at Unknown time  . clopidogrel (PLAVIX) 75 MG tablet Take 1 tablet (75 mg total) by mouth daily. -- Office visit needed for further refills 90 tablet 0 01/02/2017 at 0800  . furosemide (LASIX) 40 MG tablet Take 1 tablet (40 mg total) by mouth daily. 30 tablet 6 01/02/2017 at Unknown time  . metoprolol succinate (TOPROL XL) 25 MG 24 hr tablet Take 1 tablet (25 mg total) by mouth daily. 30 tablet 9 01/02/2017 at 0800  . montelukast (SINGULAIR) 10 MG tablet Take 1 tablet (10 mg total) by mouth at bedtime. 90 tablet 3 01/02/2017 at Unknown time  . nitroGLYCERIN (NITROSTAT) 0.4 MG SL tablet Place 1 tablet (0.4 mg total) under the tongue every 5 (five) minutes x 3 doses as needed for chest pain. 25 tablet 3 unk at unk  . Omega-3 Fatty Acids (FISH OIL) 1000 MG CAPS Take 1,000 mg by mouth at bedtime.    01/02/2017 at Unknown time  . OXYGEN Inhale 2.5 L into the lungs continuous.   01/02/2017 at Unknown time  . potassium chloride  (KLOR-CON 10) 10 MEQ tablet Take 0.5 tablets (5 mEq total) by mouth daily. 45 tablet 3 01/02/2017 at Unknown time  . ranitidine (ZANTAC) 150 MG tablet TAKE 1 TABLET BY MOUTH TWICE DAILY (Patient taking differently: Take 150 mg by mouth 2 (two) times daily. ) 180 tablet 3 01/02/2017 at Unknown time  . sertraline (ZOLOFT) 50 MG tablet Take 1 tablet (50 mg total) by mouth daily. 90 tablet 1 01/02/2017 at Unknown time  . vitamin E 400 UNIT capsule Take 400 Units by mouth at bedtime.    01/02/2017 at Unknown time  . simvastatin (ZOCOR) 40 MG tablet Take 1 tablet (40 mg total) by mouth daily. (Patient not taking: Reported on 01/03/2017) 90 tablet 3 Not Taking at Unknown time  . tolterodine (DETROL) 2 MG tablet Take 1 tablet (2 mg total) by mouth at bedtime. (Patient not taking: Reported on 01/03/2017) 90 tablet 1 Not Taking at Unknown time    Assessment: 60 yof presents with SOB. Troponin +. Pharmacy consulted to dose heparin for ACS. Not on anticoagulation PTA. Hg 9.9, plt WNL. No bleeding or infusion related issues reported.  Heparin level subtherapeutic: 0.10  Goal of Therapy:  Heparin level 0.3-0.7 units/ml Monitor platelets by anticoagulation protocol:  Yes   Plan:  Bolus 1500 units IV heparin Increase heparin gtt to 850 units/hr Check heparin level/CBC with morning labs Monitor s/sx bleeding  Georga Bora, PharmD Clinical Pharmacist Pager: [phone] 01/03/2017 8:24 PM

## 2017-01-03 NOTE — ED Notes (Signed)
Pt removed from NRB by RT and placed on cannula. Pt sats decreased to 83%. Placed back on NRB and notified RT.

## 2017-01-03 NOTE — Progress Notes (Signed)
Pt failed Apex trial on 4L. Placed on 50% venti mask by RT. Will continue to monitor SATs.

## 2017-01-03 NOTE — Progress Notes (Addendum)
Returned call to ED for [facility] for bedside report for patient going to 4E 25. Left message with Levada Dy

## 2017-01-03 NOTE — Progress Notes (Signed)
[place] for heparin Indication: chest pain/ACS  Heparin Dosing Weight: 56.8 kg   Assessment: 88 yof presents with SOB. Troponin +. Pharmacy consulted to dose heparin for ACS. Not on anticoagulation PTA. Hg 9.9, plt WNL. No bleed documented.  Goal of Therapy:  Heparin level 0.3-0.7 units/ml Monitor platelets by anticoagulation protocol: Yes   Plan:  Heparin 3000 unit bolus Start heparin at 700 units/h 8h heparin level Daily heparin level/CBC Monitor s/sx bleeding   Elicia Lamp, PharmD, BCPS Clinical Pharmacist 01/03/2017 12:12 PM

## 2017-01-03 NOTE — ED Notes (Signed)
  [place] with [place], NP about elevated Troponin

## 2017-01-03 NOTE — ED Provider Notes (Signed)
[facility] DEPT Provider Note   CSN: [id number] Arrival date & time: 01/03/17  1047     History   Chief Complaint Chief Complaint  Patient presents with  . Shortness of Breath    HPI Linda Kelly is a 83 y.o. female.  HPI Arrives stating that she is short of breath symptoms progress over the last 24 hours Patient states that she has had difficulty sleeping as laying down flat makes her shortness of breath worse She does have a history of congestive heart failure Echo reviewed from 07/2016 revealed an ejection fraction of 40-45% with mild to moderate mitral regurgitation Patient also has significant coronary artery disease based on A cath done at the same time However, patient denies any chest pain When EMS arrived, patient was satting in the 60s on 2.5 L of oxygen, which she uses at home at baseline  The suspected pulmonary edema and administered one nitroglycerin but patient became hypotensive to the 70 systolic This resolved spontaneously and patient was placed on BiPAP as well   after approximately 10 minutes on BiPAP patient was satting normally but continued with increased work of breathing Patient is on Plavix, Lasix 40 mg daily She states she's been compliant with her medications Denies any history of clots in the lungs or legs She denies any significant infectious symptoms although does endorse a mild cough  Past Medical History:  Diagnosis Date  . Anxiety   . Anxiety and depression   . Arthritis   . Benign neoplasm of esophagus   . CAD (coronary artery disease)   . Cancer [facility])    Skin cancer on back  . Carotid artery disease ([place])   . CHF (congestive heart failure) ([place])   . Chronic gastritis   . Colon, diverticulosis   . COPD (chronic obstructive pulmonary disease) ([place])   . Depression   . GERD (gastroesophageal reflux disease)   . Hiatal hernia   . High frequency hearing loss   . Hyperlipidemia   . IBS (irritable bowel syndrome)   . Idiopathic  pulmonary fibrosis   . Internal hemorrhoid   . MI (myocardial infarction) 1985  . Other nonthrombocytopenic purpuras   . PONV (postoperative nausea and vomiting)   . PVD (peripheral vascular disease) ([place])   . Shortness of breath    with exertion  . Solitary pulmonary nodule   . Stroke [facility])    Hx: of several mini -strokes  . Tinnitus    chronic  . Vertigo     Patient Active Problem List   Diagnosis Date Noted  . Chronic respiratory failure ([place]) 11/29/2016  . Episodic lightheadedness 08/28/2016  . Sleep difficulties 08/28/2016  . Acute combined systolic and diastolic congestive heart failure ([place])   . UIP (usual interstitial pneumonitis) ([place])   . NSTEMI (non-ST elevated myocardial infarction) ([place]) 08/08/2016  . Lumbar radiculopathy 08/01/2016  . Left-sided low back pain without sciatica 03/28/2016  . Cephalalgia 03/10/2016  . Overactive bladder 02/21/2016  . Poor balance 02/21/2016  . Chronic lower back pain 02/21/2016  . Carotid stenosis-s/p CEA 2015 03/03/2014  . Thoracic aorta atherosclerosis ([place]) 02/04/2014  . Change in bowel habits 05/22/2011  . URINARY INCONTINENCE 12/22/2010  . ABDOMINAL BRUIT 08/10/2010  . FASTING HYPERGLYCEMIA 11/15/2009  . Depression 08/01/2009  . GERD 08/01/2009  . IRRITABLE BOWEL SYNDROME 12/27/2008  . TINNITUS, CHRONIC 11/05/2008  . HEARING LOSS, HIGH FREQUENCY 11/05/2008  . HIATAL HERNIA 08/11/2008  . OTHER NONTHROMBOCYTOPENIC PURPURAS 06/08/2008  . ORTHOSTATIC HYPOTENSION 06/08/2008  .  Lung nodule 11/20/2007  . OBESITY, MILD 11/19/2007  . Hyperlipidemia 10/09/2007  . Tobacco abuse, in remission 10/09/2007  . Essential hypertension 10/09/2007  . CAD S/P percutaneous coronary angioplasty 1985 10/09/2007  . Peripheral vascular disease ([place]) 10/09/2007  . COPD mixed type ([place]) 10/09/2007  . VERTIGO 03/31/2007  . Headache 03/31/2007    Past Surgical History:  Procedure Laterality Date  . ANGIOPLASTY  1997, 98, 99  . BREAST  SURGERY     Hx; of biopsy  . CARDIAC CATHETERIZATION N/A 08/10/2016   Procedure: Left Heart Cath and Coronary Angiography;  Surgeon: Belva Crome, MD;  Location: [facility];  Service: Cardiovascular;  Laterality: N/A;  . CAROTID ENDARTERECTOMY     left side x 3, saphenous vein graft from left leg  . CATARACT EXTRACTION W/ INTRAOCULAR LENS  IMPLANT, BILATERAL    . COLON SURGERY    . COLONOSCOPY  11-27-01, 10-30-05, 05-24-11   diverticulosis, hemorrhoids  . DENTAL SURGERY     2012   . ENDARTERECTOMY Right 03/03/2014   Procedure: RIGHT CAROTID ENDARTERECTOMY WITH PATCH ANGIOPLASTY;  Surgeon: Rosetta Posner, MD;  Location: [facility];  Service: Vascular;  Laterality: Right;  . FOOT SURGERY     bilateral  . SHOULDER SURGERY     left  . TOE SURGERY     right foot  . TONSILLECTOMY    . TUBAL LIGATION    . UPPER GASTROINTESTINAL ENDOSCOPY  11-27-01, 08-18-08   Hiatal hernia, benign esophagus neoplasia, gastritis, tortuous esophagus 34 Maloney dilation performed    OB History    No data available       Home Medications    Prior to Admission medications   Medication Sig Start Date End Date Taking? Authorizing Provider  aspirin EC 81 MG tablet Take 81 mg by mouth at bedtime.     Historical Provider, MD  cholecalciferol (VITAMIN D) 1000 UNITS tablet Take 1,000 Units by mouth at bedtime.     Historical Provider, MD  clopidogrel (PLAVIX) 75 MG tablet Take 1 tablet (75 mg total) by mouth daily. -- Office visit needed for further refills 12/10/16   Binnie Rail, MD  folic acid (FOLVITE) 694 MCG tablet Take 400 mcg by mouth at bedtime.     Historical Provider, MD  furosemide (LASIX) 40 MG tablet Take 1 tablet (40 mg total) by mouth daily. 08/14/16   Cheryln Manly, NP  loratadine (CLARITIN) 10 MG tablet Take 10 mg by mouth daily as needed for allergies.    Historical Provider, MD  metoprolol succinate (TOPROL XL) 25 MG 24 hr tablet Take 1 tablet (25 mg total) by mouth daily. 08/22/16   Almyra Deforest, PA  montelukast (SINGULAIR) 10 MG tablet Take 1 tablet (10 mg total) by mouth at bedtime. 03/22/16   Binnie Rail, MD  nitroGLYCERIN (NITROSTAT) 0.4 MG SL tablet Place 1 tablet (0.4 mg total) under the tongue every 5 (five) minutes x 3 doses as needed for chest pain. 08/14/16   Cheryln Manly, NP  Omega-3 Fatty Acids (FISH OIL) 1000 MG CAPS Take 1,000 mg by mouth at bedtime.     Historical Provider, MD  OXYGEN Inhale 2.5 L into the lungs continuous.    Historical Provider, MD  potassium chloride (KLOR-CON 10) 10 MEQ tablet Take 0.5 tablets (5 mEq total) by mouth daily. 09/27/16   Lelon Perla, MD  ranitidine (ZANTAC) 150 MG tablet TAKE 1 TABLET BY MOUTH TWICE DAILY Patient taking differently: Take  150 mg by mouth 2 (two) times daily.  02/21/16   Binnie Rail, MD  sertraline (ZOLOFT) 50 MG tablet Take 1 tablet (50 mg total) by mouth daily. 09/19/16   Binnie Rail, MD  simvastatin (ZOCOR) 40 MG tablet Take 1 tablet (40 mg total) by mouth daily. Patient taking differently: Take 40 mg by mouth at bedtime.  02/21/16   Binnie Rail, MD  tolterodine (DETROL) 2 MG tablet Take 1 tablet (2 mg total) by mouth at bedtime. 08/28/16   Binnie Rail, MD  vitamin E 400 UNIT capsule Take 400 Units by mouth at bedtime.     Historical Provider, MD    Family History Family History  Problem Relation Age of Onset  . Heart attack Father     deceased at 46, MGF  . Colon cancer Father   . Prostate cancer Father   . Heart disease Father   . Hyperlipidemia Father   . Heart disease Mother     deaceased at age 15  . Colitis Mother   . Stroke Mother   . Hyperlipidemia Mother   . Colitis Sister   . Hyperlipidemia Sister   . Other Brother     deceased age 15; war  . Hyperlipidemia Brother   . Breast cancer Maternal Aunt     great   . Lung cancer Paternal Aunt     great  . Vasculitis Son   . Hyperlipidemia Son   . Hypertension Son   . Heart attack Son   . Cancer Sister     unknown  .  Hyperlipidemia Sister   . Hyperlipidemia Daughter   . Hypertension Daughter   . COPD Neg Hx   . Asthma Neg Hx     Social History Social History  Substance Use Topics  . Smoking status: Former Smoker    Packs/day: 0.50    Years: 60.00    Types: Cigarettes    Quit date: 02/26/2014  . Smokeless tobacco: Never Used  . Alcohol use No     Allergies   Patient has no known allergies.   Review of Systems Review of Systems  Constitutional: Negative for fever.  Allergic/Immunologic: Negative for immunocompromised state.  All other systems reviewed and are negative.    Physical Exam Updated Vital Signs BP 122/73 (BP Location: Right Arm)   Pulse 94   Temp 98.6 F (37 C) (Axillary)   Resp 16   SpO2 100%   Physical Exam  Constitutional: She appears well-developed and well-nourished. No distress.  HENT:  Head: Normocephalic and atraumatic.  Eyes: Conjunctivae are normal.  Neck: Neck supple.  Cardiovascular: Normal rate and regular rhythm.   No murmur heard. Pulmonary/Chest: No respiratory distress. She has no wheezes. She has rales. She exhibits no tenderness.  Tachypnic, on BiPAP  Abdominal: Soft. She exhibits no distension and no mass. There is no tenderness. There is no rebound and no guarding.  Musculoskeletal: She exhibits edema (minimal pitting edema bilaterally, no calf asymmetry or tenderness).  Neurological: She is alert.  Skin: Skin is warm and dry.  Psychiatric: She has a normal mood and affect.  Nursing note and vitals reviewed.  ED Treatments / Results  Labs (all labs ordered are listed, but only abnormal results are displayed) Labs Reviewed  CBC WITH DIFFERENTIAL/PLATELET - Abnormal; Notable for the following:       Result Value   WBC 16.4 (*)    RBC 3.26 (*)    Hemoglobin 9.9 (*)  HCT 31.5 (*)    Neutro Abs 13.6 (*)    Monocytes Absolute 1.7 (*)    All other components within normal limits  COMPREHENSIVE METABOLIC PANEL - Abnormal; Notable for the  following:    Glucose, Bld 116 (*)    BUN 22 (*)    Calcium 8.6 (*)    Albumin 3.4 (*)    ALT 9 (*)    GFR calc non Af Amer 50 (*)    GFR calc Af Amer 58 (*)    All other components within normal limits  BRAIN NATRIURETIC PEPTIDE - Abnormal; Notable for the following:    B Natriuretic Peptide 960.4 (*)    All other components within normal limits  I-STAT TROPOININ, ED - Abnormal; Notable for the following:    Troponin i, poc 2.15 (*)    All other components within normal limits  BLOOD GAS, ARTERIAL  HEPARIN LEVEL (UNFRACTIONATED)  I-STAT ARTERIAL BLOOD GAS, ED    EKG  EKG Interpretation  Date/Time:  Thursday January 03 2017 10:53:14 EST Ventricular Rate:  93 PR Interval:    QRS Duration: 108 QT Interval:  421 QTC Calculation: 524 R Axis:   -7 Text Interpretation:  Sinus rhythm Probable left ventricular hypertrophy Borderline T abnormalities, inferior leads Prolonged QT interval No significant change since last tracing Confirmed by YAO  MD, DAVID ([id number]) on 01/03/2017 11:08:37 AM       Radiology Dg Chest Port 1 View  Result Date: 01/03/2017 CLINICAL DATA:  Shortness of breath EXAM: PORTABLE CHEST 1 VIEW COMPARISON:  11/28/2016 FINDINGS: There is cardiac enlargement. Aortic atherosclerosis identified. Diffuse bilateral interstitial reticular opacities are identified compatible with pulmonary fibrosis. No superimposed airspace consolidation identified. IMPRESSION: 1. Advanced changes of pulmonary fibrosis. Electronically Signed   By: Kerby Moors M.D.   On: 01/03/2017 11:33    Procedures Procedures (including critical care time)  Medications Ordered in ED Medications  heparin bolus via infusion 3,000 Units (not administered)  heparin ADULT infusion 100 units/mL (25000 units/245mL sodium chloride 0.45%) (not administered)  furosemide (LASIX) injection 40 mg (40 mg Intravenous Given 01/03/17 1130)  aspirin chewable tablet 324 mg (324 mg Oral Given 01/03/17 1129)     Initial  Impression / Assessment and Plan / ED Course  I have reviewed the triage vital signs and the nursing notes.  Pertinent labs & imaging results that were available during my care of the patient were reviewed by me and considered in my medical decision making (see chart for details).    Significant coronary artery disease with elevated troponin concerning for NSTEMI Patient however denies any chest pain Received aspirin, initially on BiPAP but we have been successful at weaning her to a nonrebreather and currently satting 91% on such It is likely impossible that patient has demand ischemia secondary to hypotension at the scene after nitroglycerin However, given her significant coronary artery disease on a recent calf, initiated heparin and discuss with cardiology Patient received Lasix given CHF exacerbation with BNP elevation Has known pulmonary dz - on 2.5L at baseline Remainder of labs reviewed and considered in medical decision making Will admit  Final Clinical Impressions(s) / ED Diagnoses   Final diagnoses:  Congestive heart failure, unspecified congestive heart failure chronicity, unspecified congestive heart failure type ([place])  Elevated troponin    New Prescriptions New Prescriptions   No medications on file     Karma Greaser, MD 01/03/17 [place], MD 01/05/17 2028

## 2017-01-03 NOTE — ED Triage Notes (Signed)
Pt to ER by [facility] from home where patient daughter called out due to patient shortness of breath onset yesterday. Hx of CHF and COPD. EMS reports initial RA sat 61%, rales throughout, placed on CPAP. Sats 100% on arrival. Pt a/o x4. Pt normally wears 2.5 L O2 daily.

## 2017-01-03 NOTE — Consult Note (Signed)
CARDIOLOGY CONSULT NOTE   Patient ID: Linda Kelly MRN: [id number] DOB/AGE: [birth year] 83 y.o.  Admit date: 01/03/2017  Requesting Physician: Dr. Shirlyn Goltz Primary Physician:   Binnie Rail, MD Primary Cardiologist:   Dr. Stanford Breed Reason for Consultation:   Shortness of breath  HPI: Linda Kelly is a 83 y.o. female with a history of pulmonary fibrosis 2.5 L O2 at baseline,  CAD-severe multivessel/ not surgical candidate , HLD, carotid artery disease, PVD. Her cardiac issues date back to 72 when she had an MI. She was treated with a balloon angioplasty at that time. Renal dopplers 12/12 showed stable 1-59 bilateral renal artery stenosis. Patient had a right carotid endarterectomy in May 2015. Patient was admitted with congestive heart failure October 2017. Echocardiogram October 2017 showed ejection fraction 92-42%, grade 2 diastolic dysfunction and mild to moderate mitral regurgitation. Cardiac catheterization October 2017 showed severe multivessel coronary artery disease with occlusion of the right coronary artery, occlusion of the LAD, 99% distal circumflex and a very tortuous segment. Ejection fraction 35-45%. Medical therapy recommended. Surgical was not felt to be possible given her age and comorbidities, high-grade obstruction in the right iliac artery was also found during her work up.Chest CT January 2018showed interstitial lung disease. Atherosclerosis noted and thoracic aortic aneurysm measuring 4.2 cm.  Patient arrived to the ER today by EMS for acutely worsening SOB over the past day, she reports difficulty sleeping last night as well. Her O2 sats were 60's on 2.5L of  O2 she uses at home. Has been seeing Dr. Annamaria Boots recently with pulmonary for increasing oxygen dependency. Given 1 tab Nitro was administered and she dropped her systolic pressure to 70. She was then placed on BiPap which was eventually weaned and changed to nonrebreather with sats in low 90%.. In ED given  aspirin, started on Heparin drip, 40mg  IV lasix, sating 91%, Troponin 2.15. Medicine asked to admit and [place] to consult.     Past Medical History:  Diagnosis Date  . Anxiety   . Anxiety and depression   . Arthritis   . Benign neoplasm of esophagus   . CAD (coronary artery disease)   . Cancer [facility])    Skin cancer on back  . Carotid artery disease ([place])   . CHF (congestive heart failure) ([place])   . Chronic gastritis   . Colon, diverticulosis   . COPD (chronic obstructive pulmonary disease) ([place])   . Depression   . GERD (gastroesophageal reflux disease)   . Hiatal hernia   . High frequency hearing loss   . Hyperlipidemia   . IBS (irritable bowel syndrome)   . Idiopathic pulmonary fibrosis   . Internal hemorrhoid   . MI (myocardial infarction) 1985  . Other nonthrombocytopenic purpuras   . PONV (postoperative nausea and vomiting)   . PVD (peripheral vascular disease) ([place])   . Shortness of breath    with exertion  . Solitary pulmonary nodule   . Stroke [facility])    Hx: of several mini -strokes  . Tinnitus    chronic  . Vertigo      Past Surgical History:  Procedure Laterality Date  . ANGIOPLASTY  1997, 98, 99  . BREAST SURGERY     Hx; of biopsy  . CARDIAC CATHETERIZATION N/A 08/10/2016   Procedure: Left Heart Cath and Coronary Angiography;  Surgeon: Belva Crome, MD;  Location: [place] [facility];  Service: Cardiovascular;  Laterality: N/A;  . CAROTID ENDARTERECTOMY  left side x 3, saphenous vein graft from left leg  . CATARACT EXTRACTION W/ INTRAOCULAR LENS  IMPLANT, BILATERAL    . COLON SURGERY    . COLONOSCOPY  11-27-01, 10-30-05, 05-24-11   diverticulosis, hemorrhoids  . DENTAL SURGERY     2012   . ENDARTERECTOMY Right 03/03/2014   Procedure: RIGHT CAROTID ENDARTERECTOMY WITH PATCH ANGIOPLASTY;  Surgeon: Rosetta Posner, MD;  Location: [place];  Service: Vascular;  Laterality: Right;  . FOOT SURGERY     bilateral  . SHOULDER SURGERY     left  . TOE SURGERY     right  foot  . TONSILLECTOMY    . TUBAL LIGATION    . UPPER GASTROINTESTINAL ENDOSCOPY  11-27-01, 08-18-08   Hiatal hernia, benign esophagus neoplasia, gastritis, tortuous esophagus 75 Maloney dilation performed    No Known Allergies  I have reviewed the patient's current medications  . heparin 700 Units/hr (01/03/17 1228)     Prior to Admission medications   Medication Sig Start Date End Date Taking? Authorizing Provider  aspirin EC 81 MG tablet Take 81 mg by mouth at bedtime.     Historical Provider, MD  cholecalciferol (VITAMIN D) 1000 UNITS tablet Take 1,000 Units by mouth at bedtime.     Historical Provider, MD  clopidogrel (PLAVIX) 75 MG tablet Take 1 tablet (75 mg total) by mouth daily. -- Office visit needed for further refills 12/10/16   Binnie Rail, MD  folic acid (FOLVITE) 416 MCG tablet Take 400 mcg by mouth at bedtime.     Historical Provider, MD  furosemide (LASIX) 40 MG tablet Take 1 tablet (40 mg total) by mouth daily. 08/14/16   Cheryln Manly, NP  loratadine (CLARITIN) 10 MG tablet Take 10 mg by mouth daily as needed for allergies.    Historical Provider, MD  metoprolol succinate (TOPROL XL) 25 MG 24 hr tablet Take 1 tablet (25 mg total) by mouth daily. 08/22/16   Almyra Deforest, PA  montelukast (SINGULAIR) 10 MG tablet Take 1 tablet (10 mg total) by mouth at bedtime. 03/22/16   Binnie Rail, MD  nitroGLYCERIN (NITROSTAT) 0.4 MG SL tablet Place 1 tablet (0.4 mg total) under the tongue every 5 (five) minutes x 3 doses as needed for chest pain. 08/14/16   Cheryln Manly, NP  Omega-3 Fatty Acids (FISH OIL) 1000 MG CAPS Take 1,000 mg by mouth at bedtime.     Historical Provider, MD  OXYGEN Inhale 2.5 L into the lungs continuous.    Historical Provider, MD  potassium chloride (KLOR-CON 10) 10 MEQ tablet Take 0.5 tablets (5 mEq total) by mouth daily. 09/27/16   Lelon Perla, MD  ranitidine (ZANTAC) 150 MG tablet TAKE 1 TABLET BY MOUTH TWICE DAILY Patient taking differently:  Take 150 mg by mouth 2 (two) times daily.  02/21/16   Binnie Rail, MD  sertraline (ZOLOFT) 50 MG tablet Take 1 tablet (50 mg total) by mouth daily. 09/19/16   Binnie Rail, MD  simvastatin (ZOCOR) 40 MG tablet Take 1 tablet (40 mg total) by mouth daily. Patient taking differently: Take 40 mg by mouth at bedtime.  02/21/16   Binnie Rail, MD  tolterodine (DETROL) 2 MG tablet Take 1 tablet (2 mg total) by mouth at bedtime. 08/28/16   Binnie Rail, MD  vitamin E 400 UNIT capsule Take 400 Units by mouth at bedtime.     Historical Provider, MD     Social History  Social History  . Marital status: Widowed    Spouse name: N/A  . Number of children: 6  . Years of education: N/A   Occupational History  . 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 Smoking status: Former Smoker    Packs/day: 0.50    Years: 60.00    Types: Cigarettes    Quit date: 02/26/2014  . Smokeless tobacco: Never Used  . Alcohol use No  . Drug use: No  . Sexual activity: No   Other Topics Concern  . Not on file   Social History Narrative   [place] to [place] twice a year to visit sister- 3-4 weeks travel there every year.   Pt does not get regular exercise    Family Status  Relation Status  . Father Alive  . Mother Deceased  . Sister   . Brother Deceased  . Maternal Aunt Deceased  . Paternal Aunt Deceased  . Son   . Sister   . Daughter   . Neg Hx    Family History  Problem Relation Age of Onset  . Heart attack Father     deceased at 66, MGF  . Colon cancer Father   . Prostate cancer Father   . Heart disease Father   . Hyperlipidemia Father   . Heart disease Mother     deaceased at age 50  . Colitis Mother   . Stroke Mother   . Hyperlipidemia Mother   . Colitis Sister   . Hyperlipidemia Sister   . Other Brother     deceased age 82; war  . Hyperlipidemia Brother   . Breast cancer Maternal Aunt     great   .  Lung cancer Paternal Aunt     great  . Vasculitis Son   . Hyperlipidemia Son   . Hypertension Son   . Heart attack Son   . Cancer Sister     unknown  . Hyperlipidemia Sister   . Hyperlipidemia Daughter   . Hypertension Daughter   . COPD Neg Hx   . Asthma Neg Hx        ROS:  Full 14 point review of systems complete and found to be negative unless listed above.  Physical Exam Blood pressure 122/73, pulse 94, temperature 98.6 F (37 C), temperature source Axillary, resp. rate 16, height 4\' 10"  (1.473 m), weight 154 lb 1.6 oz (69.9 kg), SpO2 90 %. Body mass index is 32.21 kg/m.  General: Well developed, well nourished, ill appearing with respiratory distress. Head: Normocephalic, atraumatic, sclera non-icteric, no xanthomas, nares are without discharge.          Neck: Negative for carotid bruits. + JVD  Lungs: +velcro opening to ascultation, + rales and increased effort on breathing on nonrebreather. Heart: RRR with S1 S2. No murmurs, rubs, or gallops appreciated. Abdomen: Soft, non-tender, non-distended with normoactive bowel sounds. No hepatomegaly. No rebound/guarding. No obvious abdominal masses. Msk:  deconditioned Extremities: No clubbing or cyanosis. No edema.  Distal pedal pulses are 2+ and equal bilaterally. Neuro: Alert and oriented X 3. No focal deficit. No facial asymmetry. Moves all extremities spontaneously. Psych:  Responds to questions appropriately with a normal affect.        Labs:  Lab Results  Component Value Date   WBC 16.4 (H) 01/03/2017   HGB 9.9 (L) 01/03/2017   HCT 31.5 (L) 01/03/2017   MCV  96.6 01/03/2017   PLT 211 01/03/2017   No results for input(s): INR in the last 72 hours.  Recent Labs Lab 01/03/17 1117  NA 138  K 4.2  CL 103  CO2 26  BUN 22*  CREATININE 0.99  CALCIUM 8.6*  PROT 7.2  BILITOT 1.1  ALKPHOS 63  ALT 9*  AST 23  GLUCOSE 116*  ALBUMIN 3.4*    Recent Labs  01/03/17 1128  TROPIPOC 2.15*   No results found  for: PROBNP Lab Results  Component Value Date   CHOL 144 09/25/2016   HDL 32 (L) 09/25/2016   LDLCALC 71 09/25/2016   TRIG 204 (H) 09/25/2016    Echo   Left Heart Cath and coronary Angiography 08/10/2016  Conclusion    Complicated procedure that demonstrated high-grade obstruction in the right iliac artery preventing the procedure from being completed and requiring crossover to the left femoral artery.  Inability to advance the pulmonary artery catheter into the pulmonary artery or capillary wedge position. RV systolic pressure is mildly to moderately elevated.  Severe multivessel coronary disease with chronic total occlusion of the mid RCA collateralized by right to right and left-to-right sources.  Total occlusion of the mid LAD beyond a large first diagonal. Left left collaterals are noted.  99% stenosis in the mid to distal native circumflex beyond a very tortuous segment. PCI was not attempted. Distal territory is relatively small.  Inferior wall severe hypokinesis involving the basal to mid segment. Anterior and apical mild hypokinesis is noted. Ejection fraction is estimated to be 35-45%. Presentation was that of acute on chronic combined systolic and diastolic heart failure.     Transthoracic Echocardiography 08/10/16  Study Conclusions  - Left ventricle: The cavity size was normal. Wall thickness was   normal. Systolic function was mildly to moderately reduced. The   estimated ejection fraction was in the range of 40% to 45%.   Moderate hypokinesis of the basal-midanterolateral and   inferolateral myocardium; consistent with ischemia in the   distribution of the left circumflex coronary artery. Features are   consistent with a pseudonormal left ventricular filling pattern,   with concomitant abnormal relaxation and increased filling   pressure (grade 2 diastolic dysfunction). - Mitral valve: Moderately calcified annulus. There was mild to   moderate  regurgitation directed centrally. - Left atrium: The atrium was mildly dilated.      ECG:  HR 93, Sinus Rhythm - personally reviewed     Radiology:   Dg Chest Port 1 View Result Date: 01/03/2017 IMPRESSION: 1. Advanced changes of pulmonary fibrosis. Electronically Signed   By: Kerby Moors M.D.   On: 01/03/2017 11:33   ASSESSMENT AND PLAN:       Acute on chronic, systolic and diastolic heart failure and acute on chronic respiratory failure with pulmonary fibrosis: Following closely with Dr. Annamaria Boots for progressive oxygen dependency.  -  Monitor I/O's -  Daily weights - Lasix 40 mg IV BID -  Monitor creatinine, potassium, repeat BMet in the AM - Will follow patient closely,   Severe CAD  - Not a surgical candidate due to comorbidities. Will continue to manage medically.  Hypertension -  Monitor blood pressures closely, currently 122/73. Would not recommend Nitro at this time as it dropped her pressure.  SignedLinus Mako, PA-C 01/03/2017 1:29 PM  Co-Sign MD  Personally seen and examined. Agree with above. 83 year old with oxygen dependent chronic lung disease, pulmonary fibrosis, severe nonoperable coronary artery disease, moderate systolic dysfunction here  with hypoxic acute on chronic respiratory failure with combination of acute on chronic systolic/diastolic heart failure.  Moderately ill-appearing, laying in bed, family at bedside. Face mask oxygen in place. Velcro-like crackles heard in lungs bilaterally. Minimally elevated JVD. No significant edema.   - We will go ahead and empirically continue Lasix IV to help decongest and reduced left ventricular end-diastolic pressures.  - Her troponin elevation is likely suggestive of demand ischemia and not true acute coronary syndrome. This does however portend a worse and cardiovascular prognosis. She does have severe underlying coronary artery disease and any undue stress on her heart may lead to supply demand  mismatch.  - She has underlying peripheral vascular disease as well as aortic atherosclerosis which increases her overall risk.  - Prior carotid endarterectomy.  - Primary team will continue to address her underlying progressive pulmonary fibrosis. Dr. Janee Morn note personally reviewed.  - We will continue to follow closely.  Candee Furbish, MD

## 2017-01-03 NOTE — H&P (Signed)
History and Physical    Linda Kelly MGQ:[id number] DOB: May 12, 1928 DOA: 01/03/2017   PCP: Binnie Rail, MD   Patient coming from/Resides with: Private residence/alone but children check on daily  Admission status: Inpatient/sdu-medically necessary to stay a minimum 2 midnights to rule out impending and/or unexpected changes in physiologic status that may differ from initial evaluation performed in the ER and/or at time of admission. Patient presents with acute on chronic respiratory failure with increasing O2 needs. Suspect exacerbation of underlying UIP has precipitated CHF exacerbation and exacerbated underlying known severe multivessel CAD. Will require close monitoring of oxygen status as oxygen is slowly going back to baseline, telemetry monitoring as well as cycling troponin to rule out significant cardiac event although not candidate for cardiac catheterization. She will also require IV Lasix and frequent monitoring of hemodynamic status and strict intake and output.  Chief Complaint: Shortness of breath  HPI: Linda Kelly is a 83 y.o. 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 Patient developed shortness of breath on 3/7 that became more progressive. EMS was called to the home and room air saturations were 61%. O2 saturations were not obtained on patient's usual 2 L/m. CPAP was applied with subsequent sats 100%. Patient denies chest pain although does report some numbness and aching in the left arm. She is not having any jaw pain. She is not having cough or other infectious symptoms. She  is not been on any prednisone recently.  ED Course:  Vital Signs: BP 122/73 (BP Location: Right Arm)   Pulse 94   Temp 98.6 F (37 C) (Axillary)   Resp 16   Ht 4\' 10"  (1.473 m)   Wt 69.9 kg (154 lb 1.6 oz)   SpO2 93%   BMI 32.21 kg/m  PCXR: Advanced changes of pulmonary fibrosis Lab data: Sodium 138, potassium 4.2, chloride 13, CO2 26, glucose 116, BUN 22, creatinine 0.99, LFTs not elevated, BNP 960, poc troponin 2.15, white count 16,400 with neutrophils 83% and absolute H Phyllis 13.6%, hemoglobin 9.9, platelets 211,000 Medications and treatments: Lasix 40 mg IV 1, aspirin 324 mg 1, heparin bolus 3000 units followed by an infusion at 700 units/hr  Review of Systems:  In addition to the HPI above,  No Fever-chills, myalgias or other constitutional symptoms No Headache, changes with Vision or hearing, new weakness, tingling, numbness in any extremity, dizziness, dysarthria or word finding difficulty, gait disturbance or imbalance, tremors or seizure activity No problems swallowing food or Liquids, indigestion/reflux, choking or coughing while eating, abdominal pain with or after eating No Chest pain, Cough, palpitations No Abdominal pain, N/V, melena,hematochezia, dark tarry stools, constipation No dysuria, malodorous urine, hematuria or flank pain No new skin rashes, lesions, masses or bruises, No new joint pains, aches, swelling or redness No recent unintentional weight gain or loss No polyuria, polydypsia or polyphagia   Past Medical History:  Diagnosis Date  . Anxiety   . Anxiety and depression   . Arthritis   . Benign neoplasm of esophagus   . CAD (coronary artery disease)   . Cancer [facility]    Skin cancer on back  . Carotid artery disease ([place])   .  CHF (congestive heart failure) ([place])   . Chronic gastritis   . Colon, diverticulosis   . COPD (chronic obstructive pulmonary disease) ([place])   . Depression   . GERD (gastroesophageal reflux disease)   . Hiatal hernia   .  High frequency hearing loss   . Hyperlipidemia   . IBS (irritable bowel syndrome)   . Idiopathic pulmonary fibrosis   . Internal hemorrhoid   . MI (myocardial infarction) 1985  . Other nonthrombocytopenic purpuras   . PONV (postoperative nausea and vomiting)   . PVD (peripheral vascular disease) ([place])   . Shortness of breath    with exertion  . Solitary pulmonary nodule   . Stroke [facility])    Hx: of several mini -strokes  . Tinnitus    chronic  . Vertigo     Past Surgical History:  Procedure Laterality Date  . ANGIOPLASTY  1997, 98, 99  . BREAST SURGERY     Hx; of biopsy  . CARDIAC CATHETERIZATION N/A 08/10/2016   Procedure: Left Heart Cath and Coronary Angiography;  Surgeon: Belva Crome, MD;  Location: [place] [facility];  Service: Cardiovascular;  Laterality: N/A;  . CAROTID ENDARTERECTOMY     left side x 3, saphenous vein graft from left leg  . CATARACT EXTRACTION W/ INTRAOCULAR LENS  IMPLANT, BILATERAL    . COLON SURGERY    . COLONOSCOPY  11-27-01, 10-30-05, 05-24-11   diverticulosis, hemorrhoids  . DENTAL SURGERY     2012   . ENDARTERECTOMY Right 03/03/2014   Procedure: RIGHT CAROTID ENDARTERECTOMY WITH PATCH ANGIOPLASTY;  Surgeon: Rosetta Posner, MD;  Location: [place];  Service: Vascular;  Laterality: Right;  . FOOT SURGERY     bilateral  . SHOULDER SURGERY     left  . TOE SURGERY     right foot  . TONSILLECTOMY    . TUBAL LIGATION    . UPPER GASTROINTESTINAL ENDOSCOPY  11-27-01, 08-18-08   Hiatal hernia, benign esophagus neoplasia, gastritis, tortuous esophagus 62 Maloney dilation performed    Social History   Social History  . Marital status: Widowed    Spouse name: N/A  . Number of children: 6  . Years of education: N/A   Occupational History  . 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 Smoking status: Former Smoker    Packs/day: 0.50    Years: 60.00    Types: Cigarettes    Quit date: 02/26/2014  . Smokeless tobacco:  Never Used  . Alcohol use No  . Drug use: No  . Sexual activity: No   Other Topics Concern  . Not on file   Social History Narrative   [place] to [place] twice a year to visit sister- 3-4 weeks travel there every year.   Pt does not get regular exercise    Mobility: Rolling walker Work history: N/A   No Known Allergies  Family History  Problem Relation Age of Onset  . Heart attack Father     deceased at 37, MGF  . Colon cancer Father   . Prostate cancer Father   . Heart disease Father   . Hyperlipidemia Father   . Heart disease Mother     deaceased at age 27  . Colitis Mother   . Stroke Mother   . Hyperlipidemia Mother   . Colitis Sister   . Hyperlipidemia Sister   . Other Brother  deceased age 64; war  . Hyperlipidemia Brother   . Breast cancer Maternal Aunt     great   . Lung cancer Paternal Aunt     great  . Vasculitis Son   . Hyperlipidemia Son   . Hypertension Son   . Heart attack Son   . Cancer Sister     unknown  . Hyperlipidemia Sister   . Hyperlipidemia Daughter   . Hypertension Daughter   . COPD Neg Hx   . Asthma Neg Hx      Prior to Admission medications   Medication Sig Start Date End Date Taking? Authorizing Provider  aspirin EC 81 MG tablet Take 81 mg by mouth at bedtime.    Yes Historical Provider, MD  cholecalciferol (VITAMIN D) 1000 UNITS tablet Take 1,000 Units by mouth at bedtime.    Yes Historical Provider, MD  clopidogrel (PLAVIX) 75 MG tablet Take 1 tablet (75 mg total) by mouth daily. -- Office visit needed for further refills 12/10/16  Yes Binnie Rail, MD  furosemide (LASIX) 40 MG tablet Take 1 tablet (40 mg total) by mouth daily. 08/14/16  Yes Cheryln Manly, NP  metoprolol succinate (TOPROL XL) 25 MG 24 hr tablet Take 1 tablet (25 mg total) by mouth daily. 08/22/16  Yes Almyra Deforest, PA  montelukast (SINGULAIR) 10 MG tablet Take 1 tablet (10 mg total) by mouth at bedtime. 03/22/16  Yes  Binnie Rail, MD  nitroGLYCERIN (NITROSTAT) 0.4 MG SL tablet Place 1 tablet (0.4 mg total) under the tongue every 5 (five) minutes x 3 doses as needed for chest pain. 08/14/16  Yes Cheryln Manly, NP  Omega-3 Fatty Acids (FISH OIL) 1000 MG CAPS Take 1,000 mg by mouth at bedtime.    Yes Historical Provider, MD  OXYGEN Inhale 2.5 L into the lungs continuous.   Yes Historical Provider, MD  potassium chloride (KLOR-CON 10) 10 MEQ tablet Take 0.5 tablets (5 mEq total) by mouth daily. 09/27/16  Yes Lelon Perla, MD  ranitidine (ZANTAC) 150 MG tablet TAKE 1 TABLET BY MOUTH TWICE DAILY Patient taking differently: Take 150 mg by mouth 2 (two) times daily.  02/21/16  Yes Binnie Rail, MD  sertraline (ZOLOFT) 50 MG tablet Take 1 tablet (50 mg total) by mouth daily. 09/19/16  Yes Binnie Rail, MD  vitamin E 400 UNIT capsule Take 400 Units by mouth at bedtime.    Yes Historical Provider, MD  simvastatin (ZOCOR) 40 MG tablet Take 1 tablet (40 mg total) by mouth daily. Patient not taking: Reported on 01/03/2017 02/21/16   Binnie Rail, MD  tolterodine (DETROL) 2 MG tablet Take 1 tablet (2 mg total) by mouth at bedtime. Patient not taking: Reported on 01/03/2017 08/28/16   Binnie Rail, MD    Physical Exam: Vitals:   01/03/17 1100 01/03/17 1105 01/03/17 1203 01/03/17 1300  BP: 122/73 122/73    Pulse: 92 94    Resp: 26 16    Temp:  98.6 F (37 C)    TempSrc:  Axillary    SpO2: 100% 100% 90% 93%  Weight:   69.9 kg (154 lb 1.6 oz)   Height:   4\' 10"  (1.473 m)       Constitutional: NAD, calm, comfortable Eyes: PERRL, lids and conjunctivae normal ENMT: Mucous membranes are moist. Posterior pharynx clear of any exudate or lesions.Normal dentition.  Neck: normal, supple, no masses, no thyromegaly Respiratory: Diffuse inspiratory and expiratory crackles, 85% Ventimask, mild tachypnea  with respiratory rates initially around 25-29 bpm, slight increased work of breathing at rest which may be  baseline Cardiovascular: Regular rate and rhythm, no murmurs / rubs / gallops. No extremity edema. 2+ pedal pulses. No carotid bruits.  Abdomen: no tenderness, no masses palpated. No hepatosplenomegaly. Bowel sounds positive.  Musculoskeletal: no clubbing / cyanosis. No joint deformity upper and lower extremities. Good ROM, no contractures. Normal muscle tone.  Skin: no rashes, lesions, ulcers. No induration Neurologic: CN 2-12 grossly intact. Sensation intact, DTR normal. Strength 5/5 x all 4 extremities.  Psychiatric: Normal judgment and insight. Alert and oriented x 3. Normal mood.    Labs on Admission: I have personally reviewed following labs and imaging studies  CBC:  Recent Labs Lab 01/03/17 1117  WBC 16.4*  NEUTROABS 13.6*  HGB 9.9*  HCT 31.5*  MCV 96.6  PLT 998   Basic Metabolic Panel:  Recent Labs Lab 01/03/17 1117  NA 138  K 4.2  CL 103  CO2 26  GLUCOSE 116*  BUN 22*  CREATININE 0.99  CALCIUM 8.6*   GFR: Estimated Creatinine Clearance: 33.8 mL/min (by C-G formula based on SCr of 0.99 mg/dL). Liver Function Tests:  Recent Labs Lab 01/03/17 1117  AST 23  ALT 9*  ALKPHOS 63  BILITOT 1.1  PROT 7.2  ALBUMIN 3.4*   No results for input(s): LIPASE, AMYLASE in the last 168 hours. No results for input(s): AMMONIA in the last 168 hours. Coagulation Profile: No results for input(s): INR, PROTIME in the last 168 hours. Cardiac Enzymes: No results for input(s): CKTOTAL, CKMB, CKMBINDEX, TROPONINI in the last 168 hours. BNP (last 3 results) No results for input(s): PROBNP in the last 8760 hours. HbA1C: No results for input(s): HGBA1C in the last 72 hours. CBG: No results for input(s): GLUCAP in the last 168 hours. Lipid Profile: No results for input(s): CHOL, HDL, LDLCALC, TRIG, CHOLHDL, LDLDIRECT in the last 72 hours. Thyroid Function Tests: No results for input(s): TSH, T4TOTAL, FREET4, T3FREE, THYROIDAB in the last 72 hours. Anemia Panel: No results  for input(s): VITAMINB12, FOLATE, FERRITIN, TIBC, IRON, RETICCTPCT in the last 72 hours. Urine analysis:    Component Value Date/Time   COLORURINE YELLOW 02/26/2014 1306   APPEARANCEUR CLOUDY (A) 02/26/2014 1306   LABSPEC 1.021 02/26/2014 1306   PHURINE 6.0 02/26/2014 1306   GLUCOSEU NEGATIVE 02/26/2014 1306   HGBUR NEGATIVE 02/26/2014 1306   BILIRUBINUR NEGATIVE 02/26/2014 1306   BILIRUBINUR neg 09/06/2011 1502   KETONESUR NEGATIVE 02/26/2014 1306   PROTEINUR NEGATIVE 02/26/2014 1306   UROBILINOGEN 0.2 02/26/2014 1306   NITRITE NEGATIVE 02/26/2014 1306   LEUKOCYTESUR MODERATE (A) 02/26/2014 1306   Sepsis Labs: @LABRCNTIP (procalcitonin:4,lacticidven:4) )No results found for this or any previous visit (from the past 240 hour(s)).   Radiological Exams on Admission: Dg Chest Port 1 View  Result Date: 01/03/2017 CLINICAL DATA:  Shortness of breath EXAM: PORTABLE CHEST 1 VIEW COMPARISON:  11/28/2016 FINDINGS: There is cardiac enlargement. Aortic atherosclerosis identified. Diffuse bilateral interstitial reticular opacities are identified compatible with pulmonary fibrosis. No superimposed airspace consolidation identified. IMPRESSION: 1. Advanced changes of pulmonary fibrosis. Electronically Signed   By: Kerby Moors M.D.   On: 01/03/2017 11:33    EKG: (Independently reviewed) Sinus rhythm with ventricular rate 93 bpm, QTC 524 ms, no definitive acute ischemic changes, incomplete right bundle branch block  Assessment/Plan Principal Problem:   Acute on chronic respiratory failure with hypoxia  -Patient presents with progressive onset less than 24 hours of shortness of breath  without cough, fevers or chills with elevated BNP-'s findings are concerning for likely CHF exacerbation possibly precipitated by underlying bronchitis and/or worsening of underlying UIP -Treat underlying causes -Supportive care with oxygen, incentive spirometry, early mobilization -Wean oxygen as  tolerated  Active Problems:   CAD S/P percutaneous coronary angioplasty 1985/NSTEMI (non-ST elevated myocardial infarction)  -Evaluated by cardiology today -Not a candidate for cardiac catheterization/surgical intervention -Focus on medical management -Given need to diurese with recent suboptimal blood pressures agreeable to holding beta blocker and not utilizing nitrates at this point -Continue to cycle troponins -If significant elevation in troponin may wish to repeat echocardiogram otherwise no indication to pursue at this juncture since was completed in October 2017 during previous admission -Continue low-dose aspirin and Plavix    Acute combined systolic and diastolic congestive heart failure  -Elevated BNP -Cardiology recommends diuresing with Lasix 40 mg IV q 12hrs -Holding Aldactone during acute diuresing to minimize renal effects -No ACE inhibitor secondary to relative hypotension -Beta blocker also on hold secondary to relative hypotension and acute decompensation -Daily weights with strict I/O -Echocardiogram over 2017: EF 40-34% with diastolic parameters consistent with grade 2 dysfunction    UIP (usual interstitial pneumonitis)  -Followed by Dr. Annamaria Boots as an outpatient -Does not have cough, fevers or chills but did present with leukocytosis with left shift -May have atypical presentation of bronchitis therefore will begin Levaquin-telemetry monitoring given slightly prolonged QTc interval -Discussed with patient the severity of her lung disease and recommended that at next visit with Dr. Annamaria Boots she discusses CODE STATUS more specifically recommendations to pursue a DO NOT INTUBATE status and the minimum    Essential hypertension -Current blood pressure somewhat suboptimal so holding home antihypertensive medications    COPD mixed type  -See above    Hyperlipidemia -Omega-3 fatty acids/Zocor      DVT prophylaxis: Lovenox  Code Status: Full Family Communication: No  family at bedside  Disposition Plan: Home Consults called: Cardiology/[place]   ELLIS,ALLISON L. ANP-BC [facility] Pager [phone]   If 7PM-7AM, please contact night-coverage [email or web address] Password [email or web address]  01/03/2017, 2:26 PM

## 2017-01-04 ENCOUNTER — Inpatient Hospital Stay (HOSPITAL_COMMUNITY): Payer: PPO

## 2017-01-04 DIAGNOSIS — I2510 Atherosclerotic heart disease of native coronary artery without angina pectoris: Secondary | ICD-10-CM

## 2017-01-04 DIAGNOSIS — J9621 Acute and chronic respiratory failure with hypoxia: Secondary | ICD-10-CM

## 2017-01-04 DIAGNOSIS — I214 Non-ST elevation (NSTEMI) myocardial infarction: Secondary | ICD-10-CM

## 2017-01-04 DIAGNOSIS — Z9861 Coronary angioplasty status: Secondary | ICD-10-CM

## 2017-01-04 LAB — BASIC METABOLIC PANEL
Anion gap: 13 (ref 5–15)
BUN: 21 mg/dL — AB (ref 6–20)
CALCIUM: 8.1 mg/dL — AB (ref 8.9–10.3)
CO2: 24 mmol/L (ref 22–32)
CREATININE: 0.96 mg/dL (ref 0.44–1.00)
Chloride: 99 mmol/L — ABNORMAL LOW (ref 101–111)
GFR calc non Af Amer: 52 mL/min — ABNORMAL LOW (ref >60)
Glucose, Bld: 86 mg/dL (ref 65–99)
Potassium: 3.6 mmol/L (ref 3.5–5.1)
SODIUM: 136 mmol/L (ref 135–145)

## 2017-01-04 LAB — CBC
HCT: 29.3 % — ABNORMAL LOW (ref 36.0–46.0)
Hemoglobin: 9.2 g/dL — ABNORMAL LOW (ref 12.0–15.0)
MCH: 30.2 pg (ref 26.0–34.0)
MCHC: 31.4 g/dL (ref 30.0–36.0)
MCV: 96.1 fL (ref 78.0–100.0)
Platelets: 212 10*3/uL (ref 150–400)
RBC: 3.05 MIL/uL — ABNORMAL LOW (ref 3.87–5.11)
RDW: 15.2 % (ref 11.5–15.5)
WBC: 15.1 10*3/uL — ABNORMAL HIGH (ref 4.0–10.5)

## 2017-01-04 LAB — RESPIRATORY PANEL BY PCR
ADENOVIRUS-RVPPCR: NOT DETECTED
Bordetella pertussis: NOT DETECTED
CORONAVIRUS NL63-RVPPCR: NOT DETECTED
CORONAVIRUS OC43-RVPPCR: NOT DETECTED
Chlamydophila pneumoniae: NOT DETECTED
Coronavirus 229E: NOT DETECTED
Coronavirus HKU1: NOT DETECTED
INFLUENZA A-RVPPCR: NOT DETECTED
INFLUENZA B-RVPPCR: NOT DETECTED
METAPNEUMOVIRUS-RVPPCR: NOT DETECTED
MYCOPLASMA PNEUMONIAE-RVPPCR: NOT DETECTED
PARAINFLUENZA VIRUS 1-RVPPCR: NOT DETECTED
PARAINFLUENZA VIRUS 3-RVPPCR: NOT DETECTED
PARAINFLUENZA VIRUS 4-RVPPCR: NOT DETECTED
Parainfluenza Virus 2: NOT DETECTED
RESPIRATORY SYNCYTIAL VIRUS-RVPPCR: NOT DETECTED
RHINOVIRUS / ENTEROVIRUS - RVPPCR: NOT DETECTED

## 2017-01-04 LAB — HEPARIN LEVEL (UNFRACTIONATED)
Heparin Unfractionated: 0.14 IU/mL — ABNORMAL LOW (ref 0.30–0.70)
Heparin Unfractionated: 0.5 IU/mL (ref 0.30–0.70)

## 2017-01-04 LAB — TROPONIN I: TROPONIN I: 2.44 ng/mL — AB (ref <0.03)

## 2017-01-04 LAB — SEDIMENTATION RATE: Sed Rate: 85 mm/hr — ABNORMAL HIGH (ref 0–22)

## 2017-01-04 MED ORDER — DIPHENHYDRAMINE HCL 25 MG PO CAPS
25.0000 mg | ORAL_CAPSULE | Freq: Once | ORAL | Status: AC
  Administered 2017-01-04: 25 mg via ORAL
  Filled 2017-01-04: qty 1

## 2017-01-04 MED ORDER — HEPARIN BOLUS VIA INFUSION
2000.0000 [IU] | Freq: Once | INTRAVENOUS | Status: AC
Start: 2017-01-04 — End: 2017-01-04
  Administered 2017-01-04: 2000 [IU] via INTRAVENOUS
  Filled 2017-01-04: qty 2000

## 2017-01-04 MED ORDER — ATORVASTATIN CALCIUM 40 MG PO TABS
40.0000 mg | ORAL_TABLET | Freq: Every day | ORAL | Status: DC
  Administered 2017-01-04 – 2017-01-08 (×5): 40 mg via ORAL
  Filled 2017-01-04 (×5): qty 1

## 2017-01-04 MED ORDER — MORPHINE SULFATE (PF) 2 MG/ML IV SOLN
2.0000 mg | Freq: Once | INTRAVENOUS | Status: AC
  Administered 2017-01-04: 2 mg via INTRAVENOUS
  Filled 2017-01-04: qty 1

## 2017-01-04 MED ORDER — HEPARIN BOLUS VIA INFUSION
2000.0000 [IU] | Freq: Once | INTRAVENOUS | Status: AC
  Administered 2017-01-04: 2000 [IU] via INTRAVENOUS
  Filled 2017-01-04: qty 2000

## 2017-01-04 NOTE — Progress Notes (Signed)
ANTICOAGULATION CONSULT NOTE - Follow Up Consult  Pharmacy Consult for heparin Indication: chest pain/ACS  Labs:  Recent Labs  01/03/17 1117 01/03/17 1514 01/03/17 1932 01/04/17 0243  HGB 9.9*  --   --  9.2*  HCT 31.5*  --   --  29.3*  PLT 211  --   --  212  HEPARINUNFRC  --   --  0.10* <0.10*  CREATININE 0.99  --   --  0.96  TROPONINI  --  4.04* 3.19*  --     Assessment: 83yo female undetectable on heparin with lower level despite increased rate last pm; no gtt issues per RN.  Goal of Therapy:  Heparin level 0.3-0.7 units/ml   Plan:  Will rebolus with heparin 2000 units and increase gtt by 4 units/kg/hr to 1100 units/hr and check level in [place], PharmD, BCPS  01/04/2017,5:20 AM

## 2017-01-04 NOTE — Consult Note (Signed)
PCCM Consult Note  Admission date: 01/03/2017 Consult date: 01/04/2017 Referring provider: Dr. Reesa Chew, [facility]  CC: Shortness of the breathing  HPI: 83 yo female brought to ER with dyspnea.  She was noted to have low oxygen level.  She has hx of pulmonary fibrosis and normal uses 2.5 liters oxygen.  She also has hx of CAD (not surgical candidate) and combined CHF.  She had elevated troponin.  There was concern for CHF exacerbation.  She was given lasix and cardiology consulted.  She said her symptoms started suddenly two days prior to admission.  She denies sinus congestion, sore throat, fever, wheeze, nausea, abdominal pain, diarrhea, or dysuria.  She is feeling better compared to on day of admission, but not back to baseline.  She is still on increased FiO2.  She  has a past medical history of Anxiety; Anxiety and depression; Arthritis; Benign neoplasm of esophagus; CAD (coronary artery disease); Cancer ([place]); Carotid artery disease ([place]); CHF (congestive heart failure) ([place]); Chronic gastritis; Colon, diverticulosis; COPD (chronic obstructive pulmonary disease) ([place]); Depression; GERD (gastroesophageal reflux disease); Hiatal hernia; High frequency hearing loss; Hyperlipidemia; IBS (irritable bowel syndrome); Idiopathic pulmonary fibrosis; Internal hemorrhoid; MI (myocardial infarction) (1985); Other nonthrombocytopenic purpuras; PONV (postoperative nausea and vomiting); PVD (peripheral vascular disease) ([place]); Shortness of breath; Solitary pulmonary nodule; Stroke [facility]); Tinnitus; and Vertigo.  She  has a past surgical history that includes Foot surgery; Toe Surgery; Carotid endarterectomy; Tonsillectomy; Shoulder surgery; Dental surgery; Angioplasty (1997, 98, 99); Upper gastrointestinal endoscopy (11-27-01, 08-18-08); Colonoscopy (11-27-01, 10-30-05, 05-24-11); Colon surgery; Cataract extraction w/ intraocular lens  implant, bilateral; Breast surgery; Tubal ligation; Endarterectomy (Right, 03/03/2014); and  Cardiac catheterization (N/A, 08/10/2016).  She  reports that she quit smoking about 2 years ago. Her smoking use included Cigarettes. She has a 30.00 pack-year smoking history. She has never used smokeless tobacco. She reports that she does not drink alcohol or use drugs.  Her family history includes Breast cancer in her maternal aunt; Cancer in her sister; Colitis in her mother and sister; Colon cancer in her father; Heart attack in her father and son; Heart disease in her father and mother; Hyperlipidemia in her brother, daughter, father, mother, sister, sister, and son; Hypertension in her daughter and son; Lung cancer in her paternal aunt; Other in her brother; Prostate cancer in her father; Stroke in her mother; Vasculitis in her son. There is no history of COPD or Asthma.  No Known Allergies  No current facility-administered medications on file prior to encounter.    Current Outpatient Prescriptions on File Prior to Encounter  Medication Sig  . aspirin EC 81 MG tablet Take 81 mg by mouth at bedtime.   . cholecalciferol (VITAMIN D) 1000 UNITS tablet Take 1,000 Units by mouth at bedtime.   . clopidogrel (PLAVIX) 75 MG tablet Take 1 tablet (75 mg total) by mouth daily. -- Office visit needed for further refills  . furosemide (LASIX) 40 MG tablet Take 1 tablet (40 mg total) by mouth daily.  . metoprolol succinate (TOPROL XL) 25 MG 24 hr tablet Take 1 tablet (25 mg total) by mouth daily.  . montelukast (SINGULAIR) 10 MG tablet Take 1 tablet (10 mg total) by mouth at bedtime.  . nitroGLYCERIN (NITROSTAT) 0.4 MG SL tablet Place 1 tablet (0.4 mg total) under the tongue every 5 (five) minutes x 3 doses as needed for chest pain.  . Omega-3 Fatty Acids (FISH OIL) 1000 MG CAPS Take 1,000 mg by mouth at bedtime.   . OXYGEN Inhale  2.5 L into the lungs continuous.  . potassium chloride (KLOR-CON 10) 10 MEQ tablet Take 0.5 tablets (5 mEq total) by mouth daily.  . ranitidine (ZANTAC) 150 MG tablet TAKE 1  TABLET BY MOUTH TWICE DAILY (Patient taking differently: Take 150 mg by mouth 2 (two) times daily. )  . sertraline (ZOLOFT) 50 MG tablet Take 1 tablet (50 mg total) by mouth daily.  . vitamin E 400 UNIT capsule Take 400 Units by mouth at bedtime.   . simvastatin (ZOCOR) 40 MG tablet Take 1 tablet (40 mg total) by mouth daily. (Patient not taking: Reported on 01/03/2017)  . tolterodine (DETROL) 2 MG tablet Take 1 tablet (2 mg total) by mouth at bedtime. (Patient not taking: Reported on 01/03/2017)    ROS: 12 point negative except above.  Vital signs: BP 111/70 (BP Location: Right Arm)   Pulse 91   Temp 99.3 F (37.4 C) (Axillary)   Resp (!) 28   Ht 4' 10"  (1.473 m)   Wt 145 lb 8.1 oz (66 kg)   SpO2 92%   BMI 30.41 kg/m   Intake/output: I/O last 3 completed shifts: In: 252.6 [I.V.:152.6; IV Piggyback:100] Out: 1450 [Urine:1450]  General: pleasant Neuro: normal strength, CN intact HEENT: pupils reactive, no stridor, no sinus tenderness, no LAN Cardiac: regular, 2/6 SM Chest: b/l crackles more at bases, no wheeze Abd: soft, non tender Ext: ankle edema Skin: no rashes   CMP Latest Ref Rng & Units 01/04/2017 01/03/2017 12/13/2016  Glucose 65 - 99 mg/dL 86 116(H) 86  BUN 6 - 20 mg/dL 21(H) 22(H) 15  Creatinine 0.44 - 1.00 mg/dL 0.96 0.99 0.88  Sodium 135 - 145 mmol/L 136 138 142  Potassium 3.5 - 5.1 mmol/L 3.6 4.2 4.2  Chloride 101 - 111 mmol/L 99(L) 103 97  CO2 22 - 32 mmol/L 24 26 28   Calcium 8.9 - 10.3 mg/dL 8.1(L) 8.6(L) 8.9  Total Protein 6.5 - 8.1 g/dL - 7.2 -  Total Bilirubin 0.3 - 1.2 mg/dL - 1.1 -  Alkaline Phos 38 - 126 U/L - 63 -  AST 15 - 41 U/L - 23 -  ALT 14 - 54 U/L - 9(L) -     CBC Latest Ref Rng & Units 01/04/2017 01/03/2017 08/14/2016  WBC 4.0 - 10.5 K/uL 15.1(H) 16.4(H) 12.7(H)  Hemoglobin 12.0 - 15.0 g/dL 9.2(L) 9.9(L) 10.8(L)  Hematocrit 36.0 - 46.0 % 29.3(L) 31.5(L) 33.6(L)  Platelets 150 - 400 K/uL 212 211 235     ABG    Component Value Date/Time    PHART 7.427 01/03/2017 1144   PCO2ART 38.0 01/03/2017 1144   PO2ART 105.0 01/03/2017 1144   HCO3 25.0 01/03/2017 1144   TCO2 26 01/03/2017 1144   O2SAT 98.0 01/03/2017 1144     CBG (last 3)  No results for input(s): GLUCAP in the last 72 hours.   Imaging: Dg Chest Port 1 View  Result Date: 01/04/2017 CLINICAL DATA:  Initial evaluation for increased oxygen demand. History of pulmonary fibrosis and CHF. EXAM: PORTABLE CHEST 1 VIEW COMPARISON:  Prior radiograph from 01/03/2017 as well as prior CT from 11/28/2016. FINDINGS: Stable cardiomegaly. Mediastinal silhouette within normal limits. Aortic atherosclerosis noted. Lung volumes mildly reduced. Advanced changes related to underlying pulmonary fibrosis again seen. Mild blunting of left costophrenic angle is similar to previous, suspected to be chronic. No definite pleural effusion. No overt superimposed pulmonary edema appreciated. No consolidative airspace opacity. No pneumothorax. Osseous structures unchanged.  Osteopenia noted. IMPRESSION: 1. Advanced pulmonary fibrotic  lung changes, relatively unchanged from previous. No definite superimposed acute cardiopulmonary abnormality identified. 2. Stable cardiomegaly. 3. Aortic atherosclerosis. Electronically Signed   By: Jeannine Boga M.D.   On: 01/04/2017 01:03   Dg Chest Port 1 View  Result Date: 01/03/2017 CLINICAL DATA:  Shortness of breath EXAM: PORTABLE CHEST 1 VIEW COMPARISON:  11/28/2016 FINDINGS: There is cardiac enlargement. Aortic atherosclerosis identified. Diffuse bilateral interstitial reticular opacities are identified compatible with pulmonary fibrosis. No superimposed airspace consolidation identified. IMPRESSION: 1. Advanced changes of pulmonary fibrosis. Electronically Signed   By: Kerby Moors M.D.   On: 01/03/2017 11:33     Studies: PFT 02/19/13 >> FEV1 1.40 (119%), FEV1% 86, TLC 3.31 (94%), DLCO 43% Echo 08/10/16 >> EF 40 to 45%, grade 2 DD, mild/mod MR HR CT chest  11/28/16 >> extensive subpleural reticulation and GGO, traction BTX, mild honeycombing   Antibiotics: Levaquin 3/09 >>  Cultures: Respiratory viral panel 3/08 >> negative  Events: 3/08 Admit, cardiology consulted  Summary: 83 yo female with acute on chronic hypoxic respiratory failure.  This is likely related to acute on chronic combined CHF with NSTEMI, acute on chronic pulmonary edema, and progressive hypoxia triggering a viscous cycle.  I am less concerned about infection component or inflammatory component.  She clearly has very little reserve, and wouldn't take much for her to get into trouble.  Assessment/plan:  Acute on chronic hypoxic respiratory failure. - oxygen to keep SpO2 90 to 95%  Acute on chronic combined CHF with acute on chronic pulmonary edema. NSTEMI. - negative fluid balance  Idiopathic pulmonary fibrosis. - will check ESR >> if elevated, then will give trial of steroids  Possible bronchitis. - Abx per primary team  Goals of care. - I explained that her chronic cardiac and pulmonary problems will continue to progress - might benefit from palliative care assessment  Updated pt's son and daughter at bedside  Chesley Mires, MD [place] 01/04/2017, 3:36 PM Pager:  [phone] After 3pm call: [phone]

## 2017-01-04 NOTE — Progress Notes (Signed)
ANTICOAGULATION CONSULT NOTE - Follow Up Consult  Pharmacy Consult for heparin Indication: NSTEMI  Labs:  Recent Labs  01/03/17 1117 01/03/17 1514  01/03/17 1932 01/04/17 0243 01/04/17 1249 01/04/17 2226  HGB 9.9*  --   --   --  9.2*  --   --   HCT 31.5*  --   --   --  29.3*  --   --   PLT 211  --   --   --  212  --   --   HEPARINUNFRC  --   --   < > 0.10* <0.10* 0.14* 0.50  CREATININE 0.99  --   --   --  0.96  --   --   TROPONINI  --  4.04*  --  3.19* 2.44*  --   --   < > = values in this interval not displayed.   Assessment/Plan:  83yo female therapeutic on heparin after multiple rate changes. Will continue gtt at current rate and confirm stable with am labs.   Wynona Neat, PharmD, BCPS  01/04/2017,11:06 PM

## 2017-01-04 NOTE — Progress Notes (Addendum)
Progress Note  Patient Name: Linda Kelly Date of Encounter: 01/04/2017  Primary Cardiologist: Dr. Stanford Breed  Subjective   No current chest pain. Baseline shortness of breath  Inpatient Medications    Scheduled Meds: . aspirin EC  81 mg Oral QHS  . clopidogrel  75 mg Oral Daily  . famotidine  10 mg Oral Daily  . furosemide  40 mg Intravenous BID  . levofloxacin (LEVAQUIN) IV  500 mg Intravenous Q24H  . montelukast  10 mg Oral QHS  . omega-3 acid ethyl esters  1 g Oral BID  . potassium chloride  5.0667 mEq Oral Daily  . sertraline  50 mg Oral Daily  . sodium chloride flush  3 mL Intravenous Q12H   Continuous Infusions: . heparin 1,100 Units/hr (01/04/17 0802)   PRN Meds: sodium chloride, acetaminophen, ondansetron (ZOFRAN) IV, sodium chloride flush   Vital Signs    Vitals:   01/03/17 2348 01/04/17 0402 01/04/17 0821 01/04/17 1249  BP: 128/63 127/78 133/73 111/70  Pulse: 100 91    Resp: (!) 34 (!) 28    Temp: 98.5 F (36.9 C) 98.1 F (36.7 C) 100.2 F (37.9 C) 99.3 F (37.4 C)  TempSrc: Oral Axillary Axillary Axillary  SpO2: 90% 92%    Weight:  145 lb 8.1 oz (66 kg)    Height:        Intake/Output Summary (Last 24 hours) at 01/04/17 1251 Last data filed at 01/04/17 0730  Gross per 24 hour  Intake           252.61 ml  Output             1450 ml  Net         -1197.39 ml   Filed Weights   01/03/17 1203 01/03/17 1730 01/04/17 0402  Weight: 154 lb 1.6 oz (69.9 kg) 146 lb 2.6 oz (66.3 kg) 145 lb 8.1 oz (66 kg)    Telemetry    No adverse arrhythmias - Personally Reviewed  ECG    Sinus rhythm, no ischemic changes- Personally Reviewed  Physical Exam   GEN: Moderately ill-appearing in bed.   Neck:  mild JVD, facemask oxygen Cardiac: RRR, no murmurs, rubs, or gallops.  Respiratory: Velcro-like crackles bilaterally GI: Soft, nontender, non-distended  MS: No edema; No deformity. Neuro:  Nonfocal  Psych: Normal affect   Labs    Chemistry Recent  Labs Lab 01/03/17 1117 01/04/17 0243  NA 138 136  K 4.2 3.6  CL 103 99*  CO2 26 24  GLUCOSE 116* 86  BUN 22* 21*  CREATININE 0.99 0.96  CALCIUM 8.6* 8.1*  PROT 7.2  --   ALBUMIN 3.4*  --   AST 23  --   ALT 9*  --   ALKPHOS 63  --   BILITOT 1.1  --   GFRNONAA 50* 52*  GFRAA 58* >60  ANIONGAP 9 13     Hematology Recent Labs Lab 01/03/17 1117 01/04/17 0243  WBC 16.4* 15.1*  RBC 3.26* 3.05*  HGB 9.9* 9.2*  HCT 31.5* 29.3*  MCV 96.6 96.1  MCH 30.4 30.2  MCHC 31.4 31.4  RDW 15.4 15.2  PLT 211 212    Cardiac Enzymes Recent Labs Lab 01/03/17 1514 01/03/17 1932 01/04/17 0243  TROPONINI 4.04* 3.19* 2.44*    Recent Labs Lab 01/03/17 1128  TROPIPOC 2.15*     BNP Recent Labs Lab 01/03/17 1117  BNP 960.4*     DDimer No results for input(s): DDIMER in the  last 168 hours.   Radiology    Dg Chest Port 1 View  Result Date: 01/04/2017 CLINICAL DATA:  Initial evaluation for increased oxygen demand. History of pulmonary fibrosis and CHF. EXAM: PORTABLE CHEST 1 VIEW COMPARISON:  Prior radiograph from 01/03/2017 as well as prior CT from 11/28/2016. FINDINGS: Stable cardiomegaly. Mediastinal silhouette within normal limits. Aortic atherosclerosis noted. Lung volumes mildly reduced. Advanced changes related to underlying pulmonary fibrosis again seen. Mild blunting of left costophrenic angle is similar to previous, suspected to be chronic. No definite pleural effusion. No overt superimposed pulmonary edema appreciated. No consolidative airspace opacity. No pneumothorax. Osseous structures unchanged.  Osteopenia noted. IMPRESSION: 1. Advanced pulmonary fibrotic lung changes, relatively unchanged from previous. No definite superimposed acute cardiopulmonary abnormality identified. 2. Stable cardiomegaly. 3. Aortic atherosclerosis. Electronically Signed   By: Jeannine Boga M.D.   On: 01/04/2017 01:03   Dg Chest Port 1 View  Result Date: 01/03/2017 CLINICAL DATA:   Shortness of breath EXAM: PORTABLE CHEST 1 VIEW COMPARISON:  11/28/2016 FINDINGS: There is cardiac enlargement. Aortic atherosclerosis identified. Diffuse bilateral interstitial reticular opacities are identified compatible with pulmonary fibrosis. No superimposed airspace consolidation identified. IMPRESSION: 1. Advanced changes of pulmonary fibrosis. Electronically Signed   By: Kerby Moors M.D.   On: 01/03/2017 11:33    Cardiac Studies   Echocardiogram 08/10/16:  - Left ventricle: The cavity size was normal. Wall thickness was   normal. Systolic function was mildly to moderately reduced. The   estimated ejection fraction was in the range of 40% to 45%.   Moderate hypokinesis of the basal-midanterolateral and   inferolateral myocardium; consistent with ischemia in the   distribution of the left circumflex coronary artery. Features are   consistent with a pseudonormal left ventricular filling pattern,   with concomitant abnormal relaxation and increased filling   pressure (grade 2 diastolic dysfunction). - Mitral valve: Moderately calcified annulus. There was mild to   moderate regurgitation directed centrally. - Left atrium: The atrium was mildly dilated.   Patient Profile     83 y.o. female with pulmonary fibrosis, acute on chronic systolic and diastolic heart failure, acute on chronic respiratory failure, severe coronary artery disease not amenable to PCI or CABG, hypertension, non-ST elevation myocardial infarction, type II secondary to supply demand mismatch  Assessment & Plan    Acute on chronic systolic heart failure/acute on chronic hypoxic respiratory failure with pulmonary fibrosis  - She is -1.4 L weight is down to 145 from 154?  - Prior EF 40-45% in October 2017.  - Creatinine 0.96, stable.  - Continue with IV Lasix  - No beta blocker secondary to recent hypotension  - No ACE inhibitor because of hypotension.  - Flu is negative, may be able to come off of drug  precautions now.  Non-ST elevation myocardial infarction, type II-demand ischemia  - Troponin 3.19 trending down to 2.4  - Supply demand mismatch from her severe underlying coronary artery disease under the current stress of her current illness.  - Noninvasive candidate. She has been deemed a non-CABG non-PCI candidate.  - Continue with medical management.  - Would recommend IV heparin until tomorrow morning.  - Continue aspirin, Plavix  - I will add atorvastatin 40 mg   Pulmonary fibrosis  - Difficult situation, progressing. Oxygen demands have been progressing in review of Dr. Janee Morn note.  - See recent CT scan   Signed, Candee Furbish, MD  01/04/2017, 12:51 PM

## 2017-01-04 NOTE — Plan of Care (Signed)
Problem: Education: Goal: Knowledge of [facility] Education information/materials will improve Outcome: Progressing Patient and her family oriented to unit/ hospital. Discussed plan of care and medications with patient/ family. Further discussed with patient how to address pain. Patient denies pain, but is aware how to rate 0-10 and aware how to use call bell for assistance.   Problem: Safety: Goal: Ability to remain free from injury will improve Outcome: Progressing Call bell within reach and room kept free from clutter. Patient displays no impulsive behavior.   Problem: Health Behavior/Discharge Planning: Goal: Ability to manage health-related needs will improve Outcome: Progressing Discussion begun regarding discharge and any needs patient would have. Aware that patient has living will which we need to add to chart.   Problem: Pain Managment: Goal: General experience of comfort will improve Outcome: Progressing No complaints of pain; however patient did have some tachypnea- for which she was given a one time dose of morphine.   Problem: Physical Regulation: Goal: Ability to maintain clinical measurements within normal limits will improve Outcome: Progressing Patient heparin increased to reach therapeutic level. Tolerating Venti mask at 15 L currently.   Problem: Skin Integrity: Goal: Risk for impaired skin integrity will decrease Outcome: Progressing Patient able to turn self, but skin assessed frequently and larger turns assisted/ encouraged. Patient currently on heparin and has increased bruising.   Problem: Tissue Perfusion: Goal: Risk factors for ineffective tissue perfusion will decrease Outcome: Progressing Working to reach therapeutic level of heparin.   Problem: Fluid Volume: Goal: Ability to maintain a balanced intake and output will improve Outcome: Progressing Patient given Lasix and having good output.

## 2017-01-04 NOTE — Progress Notes (Signed)
PROGRESS NOTE    Linda Kelly  AXK:[id number] DOB: Sep 27, 1928 DOA: 01/03/2017 PCP: Binnie Rail, MD   Brief Narrative:  83 year old female with past medical history of significant pulmonary fibrosis on chronic home oxygen from [place], multivessel CAD, combined systolic and diastolic CHF, hypertension and hyperlipidemia came to the ED with complaints of progressive shortness of breath for past 3 days. At this time she appears to be in acute CHF along with chronic progression of her pulmonary disease.   Assessment & Plan:   Principal Problem:   Acute on chronic respiratory failure with hypoxia (HCC) Active Problems:   Hyperlipidemia   Essential hypertension   CAD S/P percutaneous coronary angioplasty 1985   COPD mixed type (HCC)   NSTEMI (non-ST elevated myocardial infarction) ([place])   Acute combined systolic and diastolic congestive heart failure (HCC)   UIP (usual interstitial pneumonitis) (HCC)  Acute on chronic respiratory failure with hypoxia -At this time she is requiring high levels of oxygen-mask and high flow oxygen. -Use CPAP if needed -Incentive spirometry and wean oxygen as tolerated  Acute combined systolic and diastolic congestive heart failure stage IV with CAD -Continue IV Lasix at this time. -Daily weight and strict input and output -Cardiology following -CAD - medical management at this time.   Usual interstitial pneumonitis/pulmonary fibrosis -Supplemental oxygen. Follows with Dr. Annamaria Boots outpatient -Apparently patient was supposed be on Anoro but she had not been taking it as patient thought if she is on oxygen she doesn't need that. I will consult pulmonary for further recommendations. -She would benefit from palliative care consult as her disease is chronic and likely irreversible at this time. empirically started on Levaquin for atypical coverage  Hypertension -Continue home medications when appropriate  Hyperlipidemia -Omega-3 fatty  acids/Zocor.   DVT prophylaxis: Lovenox  Code Status: Full Family Communication: Spoke with [name] (daughter) over the phone.  Disposition Plan: Home Consults called: Cardiology/[place]  Consultants:   Cardiology   Pulm  Procedures:   None  Antimicrobials:   Levaquin   Subjective: Patient states she feels about the same at this time and does get short of breath with movement. While trying to eat her breakfast she took her mask off this morning and immediately desatted into mid 70s on room air. Her oxygenation improved after she put her mask back on to high 80s. She denies any chest pain and other complaints at this time.  After speaking to her daughter [name] I was told that since patient has been on oxygen 2.5 L at home she thought she was not supposed to take Anoro. She follows with Dr. Annamaria Boots outpatient.  Objective: Vitals:   01/03/17 2348 01/04/17 0402 01/04/17 0821 01/04/17 1249  BP: 128/63 127/78 133/73 111/70  Pulse: 100 91    Resp: (!) 34 (!) 28    Temp: 98.5 F (36.9 C) 98.1 F (36.7 C) 100.2 F (37.9 C) 99.3 F (37.4 C)  TempSrc: Oral Axillary Axillary Axillary  SpO2: 90% 92%    Weight:  66 kg (145 lb 8.1 oz)    Height:        Intake/Output Summary (Last 24 hours) at 01/04/17 1353 Last data filed at 01/04/17 0730  Gross per 24 hour  Intake           252.61 ml  Output             1250 ml  Net          -997.39 ml   [employer]  01/03/17 1203 01/03/17 1730 01/04/17 0402  Weight: 69.9 kg (154 lb 1.6 oz) 66.3 kg (146 lb 2.6 oz) 66 kg (145 lb 8.1 oz)    Examination:  General exam: Appears calm and comfortable  Respiratory system: Clear to auscultation. Respiratory effort normal. Cardiovascular system: S1 & S2 heard, RRR. No JVD, murmurs, rubs, gallops or clicks. No pedal edema. Gastrointestinal system: Abdomen is nondistended, soft and nontender. No organomegaly or masses felt. Normal bowel sounds heard. Central nervous system: Alert and oriented. No  focal neurological deficits. Extremities: Symmetric 5 x 5 power. Skin: No rashes, lesions or ulcers Psychiatry: Judgement and insight appear normal. Mood & affect appropriate.     Data Reviewed:   CBC:  Recent Labs Lab 01/03/17 1117 01/04/17 0243  WBC 16.4* 15.1*  NEUTROABS 13.6*  --   HGB 9.9* 9.2*  HCT 31.5* 29.3*  MCV 96.6 96.1  PLT 211 101   Basic Metabolic Panel:  Recent Labs Lab 01/03/17 1117 01/04/17 0243  NA 138 136  K 4.2 3.6  CL 103 99*  CO2 26 24  GLUCOSE 116* 86  BUN 22* 21*  CREATININE 0.99 0.96  CALCIUM 8.6* 8.1*   GFR: Estimated Creatinine Clearance: 33.8 mL/min (by C-G formula based on SCr of 0.96 mg/dL). Liver Function Tests:  Recent Labs Lab 01/03/17 1117  AST 23  ALT 9*  ALKPHOS 63  BILITOT 1.1  PROT 7.2  ALBUMIN 3.4*   No results for input(s): LIPASE, AMYLASE in the last 168 hours. No results for input(s): AMMONIA in the last 168 hours. Coagulation Profile: No results for input(s): INR, PROTIME in the last 168 hours. Cardiac Enzymes:  Recent Labs Lab 01/03/17 1514 01/03/17 1932 01/04/17 0243  TROPONINI 4.04* 3.19* 2.44*   BNP (last 3 results) No results for input(s): PROBNP in the last 8760 hours. HbA1C: No results for input(s): HGBA1C in the last 72 hours. CBG: No results for input(s): GLUCAP in the last 168 hours. Lipid Profile: No results for input(s): CHOL, HDL, LDLCALC, TRIG, CHOLHDL, LDLDIRECT in the last 72 hours. Thyroid Function Tests: No results for input(s): TSH, T4TOTAL, FREET4, T3FREE, THYROIDAB in the last 72 hours. Anemia Panel: No results for input(s): VITAMINB12, FOLATE, FERRITIN, TIBC, IRON, RETICCTPCT in the last 72 hours. Sepsis Labs: No results for input(s): PROCALCITON, LATICACIDVEN in the last 168 hours.  Recent Results (from the past 240 hour(s))  MRSA PCR Screening     Status: None   Collection Time: 01/03/17  5:24 PM  Result Value Ref Range Status   MRSA by PCR NEGATIVE NEGATIVE Final     Comment:        The GeneXpert MRSA Assay (FDA approved for NASAL specimens only), is one component of a comprehensive MRSA colonization surveillance program. It is not intended to diagnose MRSA infection nor to guide or monitor treatment for MRSA infections.   Respiratory Panel by PCR     Status: None   Collection Time: 01/03/17  9:00 PM  Result Value Ref Range Status   Adenovirus NOT DETECTED NOT DETECTED Final   Coronavirus 229E NOT DETECTED NOT DETECTED Final   Coronavirus HKU1 NOT DETECTED NOT DETECTED Final   Coronavirus NL63 NOT DETECTED NOT DETECTED Final   Coronavirus OC43 NOT DETECTED NOT DETECTED Final   Metapneumovirus NOT DETECTED NOT DETECTED Final   Rhinovirus / Enterovirus NOT DETECTED NOT DETECTED Final   Influenza A NOT DETECTED NOT DETECTED Final   Influenza B NOT DETECTED NOT DETECTED Final   Parainfluenza Virus 1  NOT DETECTED NOT DETECTED Final   Parainfluenza Virus 2 NOT DETECTED NOT DETECTED Final   Parainfluenza Virus 3 NOT DETECTED NOT DETECTED Final   Parainfluenza Virus 4 NOT DETECTED NOT DETECTED Final   Respiratory Syncytial Virus NOT DETECTED NOT DETECTED Final   Bordetella pertussis NOT DETECTED NOT DETECTED Final   Chlamydophila pneumoniae NOT DETECTED NOT DETECTED Final   Mycoplasma pneumoniae NOT DETECTED NOT DETECTED Final         Radiology Studies: Dg Chest Port 1 View  Result Date: 01/04/2017 CLINICAL DATA:  Initial evaluation for increased oxygen demand. History of pulmonary fibrosis and CHF. EXAM: PORTABLE CHEST 1 VIEW COMPARISON:  Prior radiograph from 01/03/2017 as well as prior CT from 11/28/2016. FINDINGS: Stable cardiomegaly. Mediastinal silhouette within normal limits. Aortic atherosclerosis noted. Lung volumes mildly reduced. Advanced changes related to underlying pulmonary fibrosis again seen. Mild blunting of left costophrenic angle is similar to previous, suspected to be chronic. No definite pleural effusion. No overt superimposed  pulmonary edema appreciated. No consolidative airspace opacity. No pneumothorax. Osseous structures unchanged.  Osteopenia noted. IMPRESSION: 1. Advanced pulmonary fibrotic lung changes, relatively unchanged from previous. No definite superimposed acute cardiopulmonary abnormality identified. 2. Stable cardiomegaly. 3. Aortic atherosclerosis. Electronically Signed   By: Jeannine Boga M.D.   On: 01/04/2017 01:03   Dg Chest Port 1 View  Result Date: 01/03/2017 CLINICAL DATA:  Shortness of breath EXAM: PORTABLE CHEST 1 VIEW COMPARISON:  11/28/2016 FINDINGS: There is cardiac enlargement. Aortic atherosclerosis identified. Diffuse bilateral interstitial reticular opacities are identified compatible with pulmonary fibrosis. No superimposed airspace consolidation identified. IMPRESSION: 1. Advanced changes of pulmonary fibrosis. Electronically Signed   By: Kerby Moors M.D.   On: 01/03/2017 11:33        Scheduled Meds: . aspirin EC  81 mg Oral QHS  . atorvastatin  40 mg Oral q1800  . clopidogrel  75 mg Oral Daily  . famotidine  10 mg Oral Daily  . furosemide  40 mg Intravenous BID  . levofloxacin (LEVAQUIN) IV  500 mg Intravenous Q24H  . montelukast  10 mg Oral QHS  . omega-3 acid ethyl esters  1 g Oral BID  . potassium chloride  5.0667 mEq Oral Daily  . sertraline  50 mg Oral Daily  . sodium chloride flush  3 mL Intravenous Q12H   Continuous Infusions: . heparin 1,100 Units/hr (01/04/17 0802)     LOS: 1 day    Time spent: 35 mins     Oretta Berkland Arsenio Loader, MD [facility] Pager [phone]   If 7PM-7AM, please contact night-coverage [email or web address] Password [email or web address] 01/04/2017, 1:53 PM

## 2017-01-04 NOTE — Plan of Care (Signed)
Problem: Safety: Goal: Ability to remain free from injury will improve Outcome: Progressing Pt alert and oriented; able to communicate needs and use call light appropriately  Problem: Pain Managment: Goal: General experience of comfort will improve Outcome: Progressing Patient uses numeric pain scale to quantify pain intensity, describe characteristics and location  Problem: Nutrition: Goal: Adequate nutrition will be maintained Outcome: Not Progressing Patient currently NPO

## 2017-01-04 NOTE — Progress Notes (Signed)
ANTICOAGULATION CONSULT NOTE - Follow Up Consult  Pharmacy Consult for Heparin Indication: chest pain/ACS  No Known Allergies  Patient Measurements: Height: 4\' 10"  (147.3 cm) Weight: 145 lb 8.1 oz (66 kg) IBW/kg (Calculated) : 40.9 Heparin Dosing Weight: 56 kg  Vital Signs: Temp: 99.3 F (37.4 C) (03/09 1249) Temp Source: Axillary (03/09 1249) BP: 111/70 (03/09 1249) Pulse Rate: 91 (03/09 0402)  Labs:  Recent Labs  01/03/17 1117 01/03/17 1514 01/03/17 1932 01/04/17 0243 01/04/17 1249  HGB 9.9*  --   --  9.2*  --   HCT 31.5*  --   --  29.3*  --   PLT 211  --   --  212  --   HEPARINUNFRC  --   --  0.10* <0.10* 0.14*  CREATININE 0.99  --   --  0.96  --   TROPONINI  --  4.04* 3.19* 2.44*  --     Estimated Creatinine Clearance: 33.8 mL/min (by C-G formula based on SCr of 0.96 mg/dL).   Medications:  Infusions:  . heparin 1,100 Units/hr (01/04/17 0802)    Assessment: 83 year old female on medical therapy for ACS including heparin. Her heparin level remains subtherapeutic despite rate adjustment. Her CBC is stable and no bleeding is noted.  Goal of Therapy:  Heparin level 0.3-0.7 units/ml Monitor platelets by anticoagulation protocol: Yes   Plan:  Heparin 2000 units IV bolus Increase Heparin infusion to 1300 units/hr Check Heparin level in 8 hours Daily heparin level and CBC Anticipate heparin will be discontinued 3/10 AM  Legrand Como S 01/04/2017,2:25 PM

## 2017-01-05 DIAGNOSIS — J84112 Idiopathic pulmonary fibrosis: Secondary | ICD-10-CM

## 2017-01-05 DIAGNOSIS — Z515 Encounter for palliative care: Secondary | ICD-10-CM

## 2017-01-05 DIAGNOSIS — J8410 Pulmonary fibrosis, unspecified: Secondary | ICD-10-CM

## 2017-01-05 DIAGNOSIS — Z7189 Other specified counseling: Secondary | ICD-10-CM

## 2017-01-05 DIAGNOSIS — I5043 Acute on chronic combined systolic (congestive) and diastolic (congestive) heart failure: Secondary | ICD-10-CM

## 2017-01-05 DIAGNOSIS — J189 Pneumonia, unspecified organism: Secondary | ICD-10-CM

## 2017-01-05 LAB — CBC
HCT: 30.5 % — ABNORMAL LOW (ref 36.0–46.0)
Hemoglobin: 9.4 g/dL — ABNORMAL LOW (ref 12.0–15.0)
MCH: 29.5 pg (ref 26.0–34.0)
MCHC: 30.8 g/dL (ref 30.0–36.0)
MCV: 95.6 fL (ref 78.0–100.0)
Platelets: 225 10*3/uL (ref 150–400)
RBC: 3.19 MIL/uL — ABNORMAL LOW (ref 3.87–5.11)
RDW: 15.1 % (ref 11.5–15.5)
WBC: 12.2 10*3/uL — ABNORMAL HIGH (ref 4.0–10.5)

## 2017-01-05 LAB — BASIC METABOLIC PANEL
Anion gap: 10 (ref 5–15)
BUN: 22 mg/dL — AB (ref 6–20)
CALCIUM: 8.5 mg/dL — AB (ref 8.9–10.3)
CO2: 29 mmol/L (ref 22–32)
CREATININE: 0.88 mg/dL (ref 0.44–1.00)
Chloride: 99 mmol/L — ABNORMAL LOW (ref 101–111)
GFR calc non Af Amer: 58 mL/min — ABNORMAL LOW (ref >60)
Glucose, Bld: 101 mg/dL — ABNORMAL HIGH (ref 65–99)
Potassium: 3.4 mmol/L — ABNORMAL LOW (ref 3.5–5.1)
SODIUM: 138 mmol/L (ref 135–145)

## 2017-01-05 LAB — HEPARIN LEVEL (UNFRACTIONATED): HEPARIN UNFRACTIONATED: 0.46 [IU]/mL (ref 0.30–0.70)

## 2017-01-05 MED ORDER — TRAZODONE HCL 50 MG PO TABS
25.0000 mg | ORAL_TABLET | Freq: Once | ORAL | Status: AC
  Administered 2017-01-05: 25 mg via ORAL
  Filled 2017-01-05: qty 1

## 2017-01-05 MED ORDER — METHYLPREDNISOLONE SODIUM SUCC 40 MG IJ SOLR: 40.0000 mg | Freq: Two times a day (BID) | INTRAMUSCULAR | Status: DC

## 2017-01-05 MED ORDER — METHYLPREDNISOLONE SODIUM SUCC 125 MG IJ SOLR
60.0000 mg | Freq: Four times a day (QID) | INTRAMUSCULAR | Status: AC
  Administered 2017-01-05 – 2017-01-08 (×14): 60 mg via INTRAVENOUS
  Filled 2017-01-05 (×14): qty 2

## 2017-01-05 MED ORDER — IPRATROPIUM-ALBUTEROL 0.5-2.5 (3) MG/3ML IN SOLN: 3.0000 mL | RESPIRATORY_TRACT | Status: DC | PRN

## 2017-01-05 NOTE — Progress Notes (Signed)
Progress Note  Patient Name: Linda Kelly Date of Encounter: 01/05/2017  Primary Cardiologist: Dr. Kirk Ruths  Subjective   Short of breath, and has required increasing oxygen requirements. No chest pain.  Inpatient Medications    Scheduled Meds: . aspirin EC  81 mg Oral QHS  . atorvastatin  40 mg Oral q1800  . clopidogrel  75 mg Oral Daily  . famotidine  10 mg Oral Daily  . furosemide  40 mg Intravenous BID  . levofloxacin (LEVAQUIN) IV  500 mg Intravenous Q24H  . methylPREDNISolone (SOLU-MEDROL) injection  60 mg Intravenous Q6H  . montelukast  10 mg Oral QHS  . omega-3 acid ethyl esters  1 g Oral BID  . potassium chloride  5.0667 mEq Oral Daily  . sertraline  50 mg Oral Daily  . sodium chloride flush  3 mL Intravenous Q12H    PRN Meds: sodium chloride, acetaminophen, ipratropium-albuterol, ondansetron (ZOFRAN) IV, sodium chloride flush   Vital Signs    Vitals:   01/05/17 0600 01/05/17 0700 01/05/17 0900 01/05/17 1100  BP: (!) 119/53 (!) 100/50 127/69   Pulse: 94 97 96   Resp: (!) 29 (!) 29 (!) 24   Temp:  97.8 F (36.6 C)  98 F (36.7 C)  TempSrc:  Axillary  Axillary  SpO2: 95% 91% 93%   Weight:      Height:        Intake/Output Summary (Last 24 hours) at 01/05/17 1207 Last data filed at 01/05/17 1119  Gross per 24 hour  Intake          1219.03 ml  Output             1500 ml  Net          -280.97 ml   Filed Weights   01/03/17 1730 01/04/17 0402 01/05/17 0358  Weight: 146 lb 2.6 oz (66.3 kg) 145 lb 8.1 oz (66 kg) 142 lb 6.7 oz (64.6 kg)    Telemetry    Sinus rhythm. Personally reviewed.  ECG    Tracing from 01/03/2017 shows sinus rhythm with increased voltage and nonspecific T-wave changes. Personally reviewed.  Physical Exam   GEN: Elderly woman, no acute distress. Oxygen via face mask. Neck: No JVD. Cardiac: RRR, no gallop.  Respiratory:  Coarse crackles bilaterally. GI: Soft, nontender, bowel sounds present. MS: No edema; No  deformity.  Labs    Chemistry Recent Labs Lab 01/03/17 1117 01/04/17 0243 01/05/17 0842  NA 138 136 138  K 4.2 3.6 3.4*  CL 103 99* 99*  CO2 26 24 29   GLUCOSE 116* 86 101*  BUN 22* 21* 22*  CREATININE 0.99 0.96 0.88  CALCIUM 8.6* 8.1* 8.5*  PROT 7.2  --   --   ALBUMIN 3.4*  --   --   AST 23  --   --   ALT 9*  --   --   ALKPHOS 63  --   --   BILITOT 1.1  --   --   GFRNONAA 50* 52* 58*  GFRAA 58* >60 >60  ANIONGAP 9 13 10      Hematology Recent Labs Lab 01/03/17 1117 01/04/17 0243 01/05/17 0842  WBC 16.4* 15.1* 12.2*  RBC 3.26* 3.05* 3.19*  HGB 9.9* 9.2* 9.4*  HCT 31.5* 29.3* 30.5*  MCV 96.6 96.1 95.6  MCH 30.4 30.2 29.5  MCHC 31.4 31.4 30.8  RDW 15.4 15.2 15.1  PLT 211 212 225    Cardiac Enzymes Recent Labs Lab 01/03/17  1514 01/03/17 1932 01/04/17 0243  TROPONINI 4.04* 3.19* 2.44*    Recent Labs Lab 01/03/17 1128  TROPIPOC 2.15*     BNP Recent Labs Lab 01/03/17 1117  BNP 960.4*     Radiology    Dg Chest Port 1 View  Result Date: 01/04/2017 CLINICAL DATA:  Initial evaluation for increased oxygen demand. History of pulmonary fibrosis and CHF. EXAM: PORTABLE CHEST 1 VIEW COMPARISON:  Prior radiograph from 01/03/2017 as well as prior CT from 11/28/2016. FINDINGS: Stable cardiomegaly. Mediastinal silhouette within normal limits. Aortic atherosclerosis noted. Lung volumes mildly reduced. Advanced changes related to underlying pulmonary fibrosis again seen. Mild blunting of left costophrenic angle is similar to previous, suspected to be chronic. No definite pleural effusion. No overt superimposed pulmonary edema appreciated. No consolidative airspace opacity. No pneumothorax. Osseous structures unchanged.  Osteopenia noted. IMPRESSION: 1. Advanced pulmonary fibrotic lung changes, relatively unchanged from previous. No definite superimposed acute cardiopulmonary abnormality identified. 2. Stable cardiomegaly. 3. Aortic atherosclerosis. Electronically Signed    By: Jeannine Boga M.D.   On: 01/04/2017 01:03    Cardiac Studies   Echocardiogram 08/10/2016: Study Conclusions  - Left ventricle: The cavity size was normal. Wall thickness was   normal. Systolic function was mildly to moderately reduced. The   estimated ejection fraction was in the range of 40% to 45%.   Moderate hypokinesis of the basal-midanterolateral and   inferolateral myocardium; consistent with ischemia in the   distribution of the left circumflex coronary artery. Features are   consistent with a pseudonormal left ventricular filling pattern,   with concomitant abnormal relaxation and increased filling   pressure (grade 2 diastolic dysfunction). - Mitral valve: Moderately calcified annulus. There was mild to   moderate regurgitation directed centrally. - Left atrium: The atrium was mildly dilated.  Cardiac catheterization 62/37/6283:  Complicated procedure that demonstrated high-grade obstruction in the right iliac artery preventing the procedure from being completed and requiring crossover to the left femoral artery.  Inability to advance the pulmonary artery catheter into the pulmonary artery or capillary wedge position. RV systolic pressure is mildly to moderately elevated.  Severe multivessel coronary disease with chronic total occlusion of the mid RCA collateralized by right to right and left-to-right sources.  Total occlusion of the mid LAD beyond a large first diagonal. Left left collaterals are noted.  99% stenosis in the mid to distal native circumflex beyond a very tortuous segment. PCI was not attempted. Distal territory is relatively small.  Inferior wall severe hypokinesis involving the basal to mid segment. Anterior and apical mild hypokinesis is noted. Ejection fraction is estimated to be 35-45%. Presentation was that of acute on chronic combined systolic and diastolic heart failure.  RECOMMENDATIONS:   Medical therapy with optimization of  heart failure regimen.  Antiplatelet therapy.  Given the patient's age, I do not believe surgical revascularization is warranted/prudent.  Only PCI target would be the mid circumflex, which I do not believe would change her clinical status (not having ischemic pain or evidence of ongoing infarction). There would be relatively high risk of inability to deliver stent and/or primary failure due to tortuosity.  Patient Profile     83 y.o. female history of pulmonary fibrosis that has progressed, acute on chronic combined heart failure, acute on chronic hypoxic respiratory failure, hypertension, and severe multivessel CAD without optimal revascularization options. She presents with a type II NSTEMI in the setting of demand ischemia and is being managed medically.  Assessment & Plan    1.  NSTEMI, type II In the setting of demand ischemia. Peak troponin I 3.19.  2. Severe multivessel CAD without optimal revascularization options. Managing medically. LVEF 40-45% as of October 2017.  3. Progressing pulmonary fibrosis with respiratory failure and oxygen requirement.  4. Acute on chronic systolic heart failure. Currently not on beta blocker or ACE inhibitor due to hypotension.  Current cardiac regimen includes aspirin, Plavix, Lipitor, and IV Lasix. Low blood pressure has limited titration of other therapies such as beta blocker and ACE inhibitor. Being followed by Pulmonary concurrently. May want to consider Palliative Care consultation in light of declining health status to establish reasonable goals of care.  Signed, Rozann Lesches, MD  01/05/2017, 12:07 PM

## 2017-01-05 NOTE — Consult Note (Signed)
PCCM Consult Note  Admission date: 01/03/2017 Consult date: 01/04/2017 Referring provider: Dr. Reesa Chew, [facility]  CC: Shortness of the breathing  HPI: 83 yo female with progressive dyspnea, hypoxia 2nd to IPF with acute pneumonitis, combined CHF.  She is followed by Dr. Annamaria Boots.  Subjective: Still feels short of breath.  Vital signs: BP 127/69   Pulse 96   Temp 97.8 F (36.6 C) (Axillary)   Resp (!) 24   Ht _0  (1.473 m)   Wt 142 lb 6.7 oz (64.6 kg)   SpO2 93%   BMI 29.77 kg/m   Intake/output: I/O last 3 completed shifts: In: 656.2 [P.O.:240; I.V.:416.2] Out: 2200 [Urine:2200]  General: pleasant Neuro: normal strength HEENT: no stridor Cardiac: regular, 2/6 SM Chest: b/l crackles Abd: soft, non tender Ext: no edema Skin: no rashes   CMP Latest Ref Rng & Units 01/05/2017 01/04/2017 01/03/2017  Glucose 65 - 99 mg/dL 101(H) 86 116(H)  BUN 6 - 20 mg/dL 22(H) 21(H) 22(H)  Creatinine 0.44 - 1.00 mg/dL 0.88 0.96 0.99  Sodium 135 - 145 mmol/L 138 136 138  Potassium 3.5 - 5.1 mmol/L 3.4(L) 3.6 4.2  Chloride 101 - 111 mmol/L 99(L) 99(L) 103  CO2 22 - 32 mmol/L _1 Calcium 8.9 - 10.3 mg/dL 8.5(L) 8.1(L) 8.6(L)  Total Protein 6.5 - 8.1 g/dL - - 7.2  Total Bilirubin 0.3 - 1.2 mg/dL - - 1.1  Alkaline Phos 38 - 126 U/L - - 63  AST 15 - 41 U/L - - 23  ALT 14 - 54 U/L - - 9(L)     CBC Latest Ref Rng & Units 01/05/2017 01/04/2017 01/03/2017  WBC 4.0 - 10.5 K/uL 12.2(H) 15.1(H) 16.4(H)  Hemoglobin 12.0 - 15.0 g/dL 9.4(L) 9.2(L) 9.9(L)  Hematocrit 36.0 - 46.0 % 30.5(L) 29.3(L) 31.5(L)  Platelets 150 - 400 K/uL 225 212 211     ABG    Component Value Date/Time   PHART 7.427 01/03/2017 1144   PCO2ART 38.0 01/03/2017 1144   PO2ART 105.0 01/03/2017 1144   HCO3 25.0 01/03/2017 1144   TCO2 26 01/03/2017 1144   O2SAT 98.0 01/03/2017 1144     CBG (last 3)  No results for input(s): GLUCAP in the last 72 hours.   Imaging: Dg Chest Port 1 View  Result Date: 01/04/2017 CLINICAL  DATA:  Initial evaluation for increased oxygen demand. History of pulmonary fibrosis and CHF. EXAM: PORTABLE CHEST 1 VIEW COMPARISON:  Prior radiograph from 01/03/2017 as well as prior CT from 11/28/2016. FINDINGS: Stable cardiomegaly. Mediastinal silhouette within normal limits. Aortic atherosclerosis noted. Lung volumes mildly reduced. Advanced changes related to underlying pulmonary fibrosis again seen. Mild blunting of left costophrenic angle is similar to previous, suspected to be chronic. No definite pleural effusion. No overt superimposed pulmonary edema appreciated. No consolidative airspace opacity. No pneumothorax. Osseous structures unchanged.  Osteopenia noted. IMPRESSION: 1. Advanced pulmonary fibrotic lung changes, relatively unchanged from previous. No definite superimposed acute cardiopulmonary abnormality identified. 2. Stable cardiomegaly. 3. Aortic atherosclerosis. Electronically Signed   By: Jeannine Boga M.D.   On: 01/04/2017 01:03   Dg Chest Port 1 View  Result Date: 01/03/2017 CLINICAL DATA:  Shortness of breath EXAM: PORTABLE CHEST 1 VIEW COMPARISON:  11/28/2016 FINDINGS: There is cardiac enlargement. Aortic atherosclerosis identified. Diffuse bilateral interstitial reticular opacities are identified compatible with pulmonary fibrosis. No superimposed airspace consolidation identified. IMPRESSION: 1. Advanced changes of pulmonary fibrosis. Electronically Signed   By: Kerby Moors M.D.   On: 01/03/2017 11:33  Studies: PFT 02/19/13 >> FEV1 1.40 (119%), FEV1% 86, TLC 3.31 (94%), DLCO 43% Echo 08/10/16 >> EF 40 to 45%, grade 2 DD, mild/mod MR HR CT chest 11/28/16 >> extensive subpleural reticulation and GGO, traction BTX, mild honeycombing  ESR 01/04/17 >> 84  Antibiotics: Levaquin 3/09 >>  Cultures: Respiratory viral panel 3/08 >> negative  Events: 3/08 Admit, cardiology consulted 3/10 Start steroids  Summary: 83 yo female with acute on chronic hypoxic  respiratory failure.  This is likely related to acute on chronic combined CHF with NSTEMI, acute on chronic pulmonary edema, and progressive hypoxia triggering a viscous cycle.  She has elevated ESR and could also have acute pneumonitis.  She clearly has very little reserve, and wouldn't take much for her to get into trouble.  Assessment/plan:  Acute on chronic hypoxic respiratory failure. - oxygen to keep SpO2 90 to 95%  Acute on chronic combined CHF with acute on chronic pulmonary edema. NSTEMI. - negative fluid balance as able  Idiopathic pulmonary fibrosis with acute pneumonitis. - change solumedrol to 60 mg q6h  Possible bronchitis. - Abx per primary team  Goals of care. - I explained that her chronic cardiac and pulmonary problems will continue to progress - might benefit from palliative care assessment  PCCM will f/u on 01/07/17 >> call if help needed sooner.  Chesley Mires, MD [facility]Critical Care 01/05/2017, 10:31 AM Pager:  [phone] After 3pm call: [phone]

## 2017-01-05 NOTE — Progress Notes (Signed)
PROGRESS NOTE    Linda Kelly  OQH:[id number] DOB: [birth year] DOA: 01/03/2017 PCP: Binnie Rail, MD   Brief Narrative:  83 year old female with past medical history of significant pulmonary fibrosis on chronic home oxygen from [place], multivessel CAD, combined systolic and diastolic CHF, hypertension and hyperlipidemia came to the ED with complaints of progressive shortness of breath for past 3 days. At this time she appears to be in acute CHF along with chronic progression of her pulmonary disease.   Assessment & Plan:   Principal Problem:   Acute on chronic respiratory failure with hypoxia (HCC) Active Problems:   Hyperlipidemia   Essential hypertension   CAD S/P percutaneous coronary angioplasty 1985   COPD mixed type (HCC)   NSTEMI (non-ST elevated myocardial infarction) ([place])   Acute combined systolic and diastolic congestive heart failure (HCC)   UIP (usual interstitial pneumonitis) (HCC)  Acute on chronic respiratory failure with hypoxia -At this time she is requiring high levels of oxygen-mask and high flow oxygen. -Use CPAP if needed -Incentive spirometry and wean oxygen as tolerated  Acute combined systolic and diastolic congestive heart failure stage IV with CAD -Continue IV Lasix at this time. -Daily weight and strict input and output -Cardiology following -CAD - medical management at this time.  -She is on heparin drip this morning for possible non-ST elevation MI likely demand ischemia. Can be stopped this morning.  Usual interstitial pneumonitis/pulmonary fibrosis -Supplemental oxygen. Follows with Dr. Annamaria Boots outpatient -Apparently patient was supposed be on Anoro but she had not been taking it as patient thought if she is on oxygen she doesn't need that. I will consult pulmonary for further recommendations. -Palliative care consult called this morning to establish long-term goals of care will Due to low volume lung reserve and chronicity of her  pulmonary pathology empirically started on Levaquin for atypical coverage -Pulmonary suggestive ESR to be checked and if elevated to start her on steroids. Her ESR is 85 therefore will start her on steroids. -Continue nebulizer treatments.  Hypertension -Continue home medications when appropriate  Hyperlipidemia -Omega-3 fatty acids next line-statin  DVT prophylaxis: Lovenox  Code Status: Full Family Communication: Spoke with [name] (daughter) over the phone.  Disposition Plan: Home Consults called: Cardiology/[place]  Consultants:   Cardiology   Pulm  Procedures:   None  Antimicrobials:   Levaquin   Subjective: Patient respiratory status still remains the same as yesterday. She is on high flow this morning and saturating low 90s. No other complaints including chest pain, dizziness, abdominal pain. She is tolerating oral diet as best as she could. I also spoke with her daughter [name] this morning was open to speak with palliative care team in terms of establishing long-term goals of care.  Objective: Vitals:   01/05/17 0500 01/05/17 0600 01/05/17 0700 01/05/17 0900  BP: (!) 124/55 (!) 119/53 (!) 100/50 127/69  Pulse: 96 94 97 96  Resp: (!) 27 (!) 29 (!) 29 (!) 24  Temp:   97.8 F (36.6 C)   TempSrc:   Axillary   SpO2: 93% 95% 91% 93%  Weight:      Height:        Intake/Output Summary (Last 24 hours) at 01/05/17 0959 Last data filed at 01/05/17 0946  Gross per 24 hour  Intake           965.35 ml  Output             1250 ml  Net          -  284.65 ml   Filed Weights   01/03/17 1730 01/04/17 0402 01/05/17 0358  Weight: 66.3 kg (146 lb 2.6 oz) 66 kg (145 lb 8.1 oz) 64.6 kg (142 lb 6.7 oz)    Examination:  General exam: Appears calm and comfortable  Respiratory system: diffuse corase Bs, on High flow.  Cardiovascular system: S1 & S2 heard, RRR. No JVD, murmurs, rubs, gallops or clicks. No pedal edema. Gastrointestinal system: Abdomen is nondistended, soft and  nontender. No organomegaly or masses felt. Normal bowel sounds heard. Central nervous system: Alert and oriented. No focal neurological deficits. Extremities: Symmetric 5 x 5 power. Skin: No rashes, lesions or ulcers Psychiatry: Judgement and insight appear normal. Mood & affect appropriate.     Data Reviewed:   CBC:  Recent Labs Lab 01/03/17 1117 01/04/17 0243  WBC 16.4* 15.1*  NEUTROABS 13.6*  --   HGB 9.9* 9.2*  HCT 31.5* 29.3*  MCV 96.6 96.1  PLT 211 440   Basic Metabolic Panel:  Recent Labs Lab 01/03/17 1117 01/04/17 0243  NA 138 136  K 4.2 3.6  CL 103 99*  CO2 26 24  GLUCOSE 116* 86  BUN 22* 21*  CREATININE 0.99 0.96  CALCIUM 8.6* 8.1*   GFR: Estimated Creatinine Clearance: 33.5 mL/min (by C-G formula based on SCr of 0.96 mg/dL). Liver Function Tests:  Recent Labs Lab 01/03/17 1117  AST 23  ALT 9*  ALKPHOS 63  BILITOT 1.1  PROT 7.2  ALBUMIN 3.4*   No results for input(s): LIPASE, AMYLASE in the last 168 hours. No results for input(s): AMMONIA in the last 168 hours. Coagulation Profile: No results for input(s): INR, PROTIME in the last 168 hours. Cardiac Enzymes:  Recent Labs Lab 01/03/17 1514 01/03/17 1932 01/04/17 0243  TROPONINI 4.04* 3.19* 2.44*   BNP (last 3 results) No results for input(s): PROBNP in the last 8760 hours. HbA1C: No results for input(s): HGBA1C in the last 72 hours. CBG: No results for input(s): GLUCAP in the last 168 hours. Lipid Profile: No results for input(s): CHOL, HDL, LDLCALC, TRIG, CHOLHDL, LDLDIRECT in the last 72 hours. Thyroid Function Tests: No results for input(s): TSH, T4TOTAL, FREET4, T3FREE, THYROIDAB in the last 72 hours. Anemia Panel: No results for input(s): VITAMINB12, FOLATE, FERRITIN, TIBC, IRON, RETICCTPCT in the last 72 hours. Sepsis Labs: No results for input(s): PROCALCITON, LATICACIDVEN in the last 168 hours.  Recent Results (from the past 240 hour(s))  MRSA PCR Screening     Status:  None   Collection Time: 01/03/17  5:24 PM  Result Value Ref Range Status   MRSA by PCR NEGATIVE NEGATIVE Final    Comment:        The GeneXpert MRSA Assay (FDA approved for NASAL specimens only), is one component of a comprehensive MRSA colonization surveillance program. It is not intended to diagnose MRSA infection nor to guide or monitor treatment for MRSA infections.   Respiratory Panel by PCR     Status: None   Collection Time: 01/03/17  9:00 PM  Result Value Ref Range Status   Adenovirus NOT DETECTED NOT DETECTED Final   Coronavirus 229E NOT DETECTED NOT DETECTED Final   Coronavirus HKU1 NOT DETECTED NOT DETECTED Final   Coronavirus NL63 NOT DETECTED NOT DETECTED Final   Coronavirus OC43 NOT DETECTED NOT DETECTED Final   Metapneumovirus NOT DETECTED NOT DETECTED Final   Rhinovirus / Enterovirus NOT DETECTED NOT DETECTED Final   Influenza A NOT DETECTED NOT DETECTED Final   Influenza B NOT  DETECTED NOT DETECTED Final   Parainfluenza Virus 1 NOT DETECTED NOT DETECTED Final   Parainfluenza Virus 2 NOT DETECTED NOT DETECTED Final   Parainfluenza Virus 3 NOT DETECTED NOT DETECTED Final   Parainfluenza Virus 4 NOT DETECTED NOT DETECTED Final   Respiratory Syncytial Virus NOT DETECTED NOT DETECTED Final   Bordetella pertussis NOT DETECTED NOT DETECTED Final   Chlamydophila pneumoniae NOT DETECTED NOT DETECTED Final   Mycoplasma pneumoniae NOT DETECTED NOT DETECTED Final         Radiology Studies: Dg Chest Port 1 View  Result Date: 01/04/2017 CLINICAL DATA:  Initial evaluation for increased oxygen demand. History of pulmonary fibrosis and CHF. EXAM: PORTABLE CHEST 1 VIEW COMPARISON:  Prior radiograph from 01/03/2017 as well as prior CT from 11/28/2016. FINDINGS: Stable cardiomegaly. Mediastinal silhouette within normal limits. Aortic atherosclerosis noted. Lung volumes mildly reduced. Advanced changes related to underlying pulmonary fibrosis again seen. Mild blunting of left  costophrenic angle is similar to previous, suspected to be chronic. No definite pleural effusion. No overt superimposed pulmonary edema appreciated. No consolidative airspace opacity. No pneumothorax. Osseous structures unchanged.  Osteopenia noted. IMPRESSION: 1. Advanced pulmonary fibrotic lung changes, relatively unchanged from previous. No definite superimposed acute cardiopulmonary abnormality identified. 2. Stable cardiomegaly. 3. Aortic atherosclerosis. Electronically Signed   By: Jeannine Boga M.D.   On: 01/04/2017 01:03   Dg Chest Port 1 View  Result Date: 01/03/2017 CLINICAL DATA:  Shortness of breath EXAM: PORTABLE CHEST 1 VIEW COMPARISON:  11/28/2016 FINDINGS: There is cardiac enlargement. Aortic atherosclerosis identified. Diffuse bilateral interstitial reticular opacities are identified compatible with pulmonary fibrosis. No superimposed airspace consolidation identified. IMPRESSION: 1. Advanced changes of pulmonary fibrosis. Electronically Signed   By: Kerby Moors M.D.   On: 01/03/2017 11:33        Scheduled Meds: . aspirin EC  81 mg Oral QHS  . atorvastatin  40 mg Oral q1800  . clopidogrel  75 mg Oral Daily  . famotidine  10 mg Oral Daily  . furosemide  40 mg Intravenous BID  . levofloxacin (LEVAQUIN) IV  500 mg Intravenous Q24H  . montelukast  10 mg Oral QHS  . omega-3 acid ethyl esters  1 g Oral BID  . potassium chloride  5.0667 mEq Oral Daily  . sertraline  50 mg Oral Daily  . sodium chloride flush  3 mL Intravenous Q12H   Continuous Infusions: . heparin 1,300 Units/hr (01/05/17 0056)     LOS: 2 days    Time spent: 35 mins     Ramy Greth Arsenio Loader, MD [facility] Pager [phone]   If 7PM-7AM, please contact night-coverage [email or web address] Password [email or web address] 01/05/2017, 9:59 AM

## 2017-01-05 NOTE — Consult Note (Signed)
Consultation Note Date: 01/05/2017   Patient Name: Linda Kelly  DOB: 1928/09/16  MRN: [id number]  Age / Sex: 83 y.o., female  PCP: Binnie Rail, MD Referring Physician: Damita Lack, MD  Reason for Consultation: Establishing goals of care  HPI/Patient Profile: 83 y.o. female  with past medical history of pulmonary fibrosis/ UIP, mixed heart failure, and severe CAD who was admitted on 01/03/2017 with acute on chronic respiratory failure secondary to heart failure in the setting of pulm fibrosis.  Linda Kelly was found to have a troponin of 3.14.  Cardiology was consulted and diagnosed her with NSTEMI.  She is not a candidate for invasive procedures and is being treated with medical management.  Prior to admission she was on 2.5L of oxygen continuously at home.  Currently she remains on 15L of high flow unable to successfully wean thus far.   Clinical Assessment and Goals of Care:  I have reviewed medical records including EPIC notes, labs and imaging, received report from the bedside RN, assessed the patient and then discussed [place], EOL wishes, disposition and options.  I introduced Palliative Medicine as specialized medical care for people living with serious illness. It focuses on providing relief from the symptoms and stress of a serious illness. The goal is to improve quality of life for both the patient and the family.  We discussed a brief life review of the patient.  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  It is felt that she developed her lung disease as a result of her exposure during her service in [place].  Many of her medical bills are paid for by a fund from the [facility].  Then I assessed functional and  nutritional status at home by gathering history.  She is able to walk around the home.  When she was really tired she used a walker.  Her children helped her with cooking, cleaning, and grocery shopping. We discussed her current illness and what it means in the larger context of her on-going co-morbidities.  Natural disease trajectory and expectations at EOL were discussed.  Linda Kelly understand that her lung disease is progressive and will slowly continue to get worse.  Dr. Annamaria Boots has been very clear with her about disease progression.  Likewise she knows that she has severe coronary artery disease and a weak heart.  We discussed the effects of this combination.  I attempted to elicit values and goals of care important to the patient.  Linda Kelly is very clear in her thinking.  She does not want to be coded - she feels she would not have a good quality of life afterward. However she would accept bipap. She states frankly "If I can't think or talk don't keep me alive".  She indicated that her Chauncey Reading is her daughter Linda Bal  Kelly and her durable power of attorney is her son Linda Kelly.  Linda Kelly had discussions just last week with her family regarding her living will, powers of attorney and will.  Unfortunately the papers have not been signed yet.  Questions and concerns were addressed.  PMT will continue to support holistically.   Primary Decision Maker:  PATIENT    SUMMARY OF RECOMMENDATIONS    PMT will continue to reach out to the family Linda Bal) to ensure that she understands her mother's condition and wishes.  Code Status/Advance Care Planning:  Limited code - Bipap is OK.  Out of this facility she will be a DNR and needs a gold form on D/C    Symptom Management:   Per primary team.  Additional Recommendations (Limitations, Scope, Preferences):  Full Scope Treatment   Patient wants to speak with someone about her hospital bill to give them the [place] information.  It is important to  her that her children not have to deal with the hospital bill.  Psycho-social/Spiritual:   Desire for further Chaplaincy support: She is Catholic.  Yes  Prognosis:   Unable to determine  Discharge Planning: To Be Determined      Primary Diagnoses: Present on Admission: . Acute on chronic respiratory failure with hypoxia ([place]) . Pulmonary fibrosis ([place]) . Non-ST elevation (NSTEMI) myocardial infarction ([place]) . Hyperlipidemia . Essential hypertension . Acute on chronic combined systolic and diastolic CHF (congestive heart failure) ([place]) . COPD mixed type ([place])   I have reviewed the medical record, interviewed the patient and family, and examined the patient. The following aspects are pertinent.  Past Medical History:  Diagnosis Date  . Anxiety   . Anxiety and depression   . Arthritis   . Benign neoplasm of esophagus   . CAD (coronary artery disease)   . Cancer [facility])    Skin cancer on back  . Carotid artery disease ([place])   . CHF (congestive heart failure) ([place])   . Chronic gastritis   . Colon, diverticulosis   . COPD (chronic obstructive pulmonary disease) ([place])   . Depression   . GERD (gastroesophageal reflux disease)   . Hiatal hernia   . High frequency hearing loss   . Hyperlipidemia   . IBS (irritable bowel syndrome)   . Idiopathic pulmonary fibrosis   . Internal hemorrhoid   . MI (myocardial infarction) 1985  . Other nonthrombocytopenic purpuras   . PONV (postoperative nausea and vomiting)   . PVD (peripheral vascular disease) ([place])   . Shortness of breath    with exertion  . Solitary pulmonary nodule   . Stroke [facility])    Hx: of several mini -strokes  . Tinnitus    chronic  . Vertigo    Social History   Social History  . Marital status: Widowed    Spouse name: N/A  . Number of children: 6  . Years of education: N/A   Occupational History  . 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 Smoking status: Former  Smoker    Packs/day: 0.50    Years: 60.00    Types: Cigarettes    Quit date: 02/26/2014  . Smokeless tobacco: Never Used  . Alcohol use No  . Drug use: No  . Sexual activity: No   Other Topics Concern  . 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   Pt does not get regular exercise   Family History  Problem Relation Age of Onset  . Heart attack Father     deceased at 70, MGF  . Colon cancer Father   . Prostate cancer Father   . Heart disease Father   . Hyperlipidemia Father   . Heart disease Mother     deaceased at age 37  . Colitis Mother   . Stroke Mother   . Hyperlipidemia Mother   . Colitis Sister   . Hyperlipidemia Sister   . Other Brother     deceased age 90; war  . Hyperlipidemia Brother   . Breast cancer Maternal Aunt     great   . Lung cancer Paternal Aunt     great  . Vasculitis Son   . Hyperlipidemia Son   . Hypertension Son   . Heart attack Son   . Cancer Sister     unknown  . Hyperlipidemia Sister   . Hyperlipidemia Daughter   . Hypertension Daughter   . COPD Neg Hx   . Asthma Neg Hx    Scheduled Meds: . aspirin EC  81 mg Oral QHS  . atorvastatin  40 mg Oral q1800  . clopidogrel  75 mg Oral Daily  . famotidine  10 mg Oral Daily  . furosemide  40 mg Intravenous BID  . levofloxacin (LEVAQUIN) IV  500 mg Intravenous Q24H  . methylPREDNISolone (SOLU-MEDROL) injection  60 mg Intravenous Q6H  . montelukast  10 mg Oral QHS  . omega-3 acid ethyl esters  1 g Oral BID  . potassium chloride  5.0667 mEq Oral Daily  . sertraline  50 mg Oral Daily  . sodium chloride flush  3 mL Intravenous Q12H   Continuous Infusions: PRN Meds:.sodium chloride, acetaminophen, ipratropium-albuterol, ondansetron (ZOFRAN) IV, sodium chloride flush No Known Allergies Review of Systems She complains of fatigue and new overwhelming weakness in the past 3 days.   Denies changes in bowel habits, dysuria, anorexia, abdominal pain or chest pain.  Physical Exam  Petite well developed female on 15L of high flow oxygen She is A&O coherent with clear speech and good judgement.  Vital Signs: BP 127/69   Pulse 96   Temp 98 F (36.7 C) (Axillary)   Resp (!) 24   Ht 4\' 10"  (1.473 m)   Wt 64.6 kg (142 lb 6.7 oz)   SpO2 93%   BMI 29.77 kg/m  Pain Assessment: 0-10   Pain Score: 4    SpO2: SpO2: 93 % O2 Device:SpO2: 93 % O2 Flow Rate: .O2 Flow Rate (L/min): 15 L/min  IO: Intake/output summary:  Intake/Output Summary (Last 24 hours) at 01/05/17 1618 Last data filed at 01/05/17 1119  Gross per 24 hour  Intake          1219.03 ml  Output             1300 ml  Net           -80.97 ml    LBM: Last BM Date: 01/03/17 Baseline Weight: Weight: 69.9 kg (154 lb 1.6 oz) Most recent weight: Weight: 64.6 kg (142 lb 6.7 oz)     Palliative Assessment/Data:   Flowsheet Rows   Flowsheet Row Most Recent Value  Intake Tab  Referral Department  Hospitalist  Unit at Time of Referral  Intermediate Care Unit  Palliative Care Primary Diagnosis  Pulmonary  Date Notified  01/05/17  Palliative  Care Type  New Palliative care  Reason for referral  Clarify Goals of Care  Date of Admission  01/03/17  Date first seen by Palliative Care  01/05/17  # of days Palliative referral response time  0 Day(s)  # of days IP prior to Palliative referral  2  Clinical Assessment  Palliative Performance Scale Score  30%  Dyspnea Max Last 24 Hours  6  Dyspnea Min Last 24 hours  3  Psychosocial & Spiritual Assessment  Palliative Care Outcomes  Patient/Family meeting held?  Yes  Who was at the meeting?  patient  Palliative Care Outcomes  Clarified goals of care, Changed CPR status  Patient/Family wishes: Interventions discontinued/not started   Mechanical Ventilation, Vasopressors      Time In: 3:00 Time Out: 4:40 Time Total: 100 Greater than 50%  of this time was spent  counseling and coordinating care related to the above assessment and plan.  Signed by: Imogene Burn, PA-C Palliative Medicine Pager: [phone]  Please contact Palliative Medicine Team phone at [phone] for questions and concerns.  For individual provider: See Shea Evans

## 2017-01-06 LAB — BASIC METABOLIC PANEL
Anion gap: 10 (ref 5–15)
BUN: 32 mg/dL — ABNORMAL HIGH (ref 6–20)
CALCIUM: 9.5 mg/dL (ref 8.9–10.3)
CO2: 31 mmol/L (ref 22–32)
CREATININE: 1.09 mg/dL — AB (ref 0.44–1.00)
Chloride: 96 mmol/L — ABNORMAL LOW (ref 101–111)
GFR, EST AFRICAN AMERICAN: 52 mL/min — AB (ref >60)
GFR, EST NON AFRICAN AMERICAN: 45 mL/min — AB (ref >60)
Glucose, Bld: 160 mg/dL — ABNORMAL HIGH (ref 65–99)
Potassium: 3.8 mmol/L (ref 3.5–5.1)
SODIUM: 137 mmol/L (ref 135–145)

## 2017-01-06 LAB — CBC
HCT: 31.8 % — ABNORMAL LOW (ref 36.0–46.0)
Hemoglobin: 10 g/dL — ABNORMAL LOW (ref 12.0–15.0)
MCH: 29.9 pg (ref 26.0–34.0)
MCHC: 31.4 g/dL (ref 30.0–36.0)
MCV: 94.9 fL (ref 78.0–100.0)
PLATELETS: 263 10*3/uL (ref 150–400)
RBC: 3.35 MIL/uL — ABNORMAL LOW (ref 3.87–5.11)
RDW: 14.8 % (ref 11.5–15.5)
WBC: 8.5 10*3/uL (ref 4.0–10.5)

## 2017-01-06 NOTE — Progress Notes (Addendum)
Daily Progress Note   Patient Name: Linda Kelly       Date: 01/06/2017 DOB: Feb 01, 1928  Age: 83 y.o. MRN#: [id number] Attending Physician: Damita Lack, MD Primary Care Physician: Binnie Rail, MD Admit Date: 01/03/2017  Reason for Consultation/Follow-up: Establishing goals of care  Subjective: Patient feeling better.  Oxygen requirements have decreased from 15L to 6L.  However, Mrs. Linda Kelly still desats significantly (7s - 84s) when trying to eat or when conversing on the phone.  We talked about home.  She lives alone and Linda Kelly is the primary daughter who is helping her now.  She is interested in having more care at home.  We discussed Hospice Services in the home and she was receptive to them.  She asked me to discuss that with Linda Kelly, her son, who she tells me today is the one she wants to help her make medical decisions (yesterday she told me it would be Linda Kelly).  Linda Kelly emphasized to me how important it is to her to be able to stay in her home.  She does not want to go to a nursing facility.  I spoke with Linda Kelly (there are [place] in the family).  His number is [phone]. Linda Kelly understands that his mother's health is declining.  He would like to work towards getting 24 hour care for her in her home.  We discussed hospice services and he was receptive to them.  He confided that his sister Linda Kelly would have trouble accepting her mother's decline as they are so close.    Assessment: 83 yo female with advanced pulmonary disease and advanced coronary artery disease.  Oxygen requirements are slowly decreasing from 15L to 6L.  She is on 2.5L at baseline.   We will continue to see how much she improves.  If she is unable to walk and remains dyspneic then she is an excellent Hospice  Services candidate.  If she is able to walk and can reduce her oxygen requirement further - she may not need hospice yet.  Of note - medications such as Anoro ($$$) would not be available on hospice service.   So if she needs/wants to use this medication she will not be a hospice candidate.   Patient Profile/HPI: 83 y.o. female  with past medical history of pulmonary fibrosis/  UIP, mixed heart failure, and severe CAD who was admitted on 01/03/2017 with acute on chronic respiratory failure secondary to heart failure in the setting of pulm fibrosis.  Linda Kelly was found to have a troponin of 3.14.  Cardiology was consulted and diagnosed her with NSTEMI.  She is not a candidate for invasive procedures and is being treated with medical management.  Prior to admission she was on 2.5L of oxygen continuously at home.  Currently she remains on 15L of high flow unable to successfully wean thus far.    Length of Stay: 3  Current Medications: Scheduled Meds:  . aspirin EC  81 mg Oral QHS  . atorvastatin  40 mg Oral q1800  . clopidogrel  75 mg Oral Daily  . famotidine  10 mg Oral Daily  . methylPREDNISolone (SOLU-MEDROL) injection  60 mg Intravenous Q6H  . montelukast  10 mg Oral QHS  . omega-3 acid ethyl esters  1 g Oral BID  . potassium chloride  5.0667 mEq Oral Daily  . sertraline  50 mg Oral Daily  . sodium chloride flush  3 mL Intravenous Q12H    Continuous Infusions:   PRN Meds: sodium chloride, acetaminophen, ipratropium-albuterol, ondansetron (ZOFRAN) IV, sodium chloride flush  Physical Exam        Well developed petite female.  Unable to eat due to dyspnea On 6L no resp distress. A&O, coherent, pleasant and cooperative  Vital Signs: BP 104/65   Pulse 97   Temp 97.3 F (36.3 C) (Oral)   Resp (!) 31   Ht 4\' 10"  (1.473 m)   Wt 66.7 kg (147 lb 0.8 oz)   SpO2 96%   BMI 30.73 kg/m  SpO2: SpO2: 96 % O2 Device: O2 Device: Venturi Mask O2 Flow Rate: O2 Flow Rate (L/min): 15  L/min  Intake/output summary:  Intake/Output Summary (Last 24 hours) at 01/06/17 1456 Last data filed at 01/06/17 0940  Gross per 24 hour  Intake              346 ml  Output             1000 ml  Net             -654 ml   LBM: Last BM Date: 01/05/17 Baseline Weight: Weight: 69.9 kg (154 lb 1.6 oz) Most recent weight: Weight: 66.7 kg (147 lb 0.8 oz)       Palliative Assessment/Data:    Flowsheet Rows   Flowsheet Row Most Recent Value  Intake Tab  Referral Department  Hospitalist  Unit at Time of Referral  Intermediate Care Unit  Palliative Care Primary Diagnosis  Pulmonary  Date Notified  01/05/17  Palliative Care Type  New Palliative care  Reason for referral  Clarify Goals of Care  Date of Admission  01/03/17  Date first seen by Palliative Care  01/05/17  # of days Palliative referral response time  0 Day(s)  # of days IP prior to Palliative referral  2  Clinical Assessment  Palliative Performance Scale Score  30%  Dyspnea Max Last 24 Hours  6  Dyspnea Min Last 24 hours  3  Psychosocial & Spiritual Assessment  Palliative Care Outcomes  Patient/Family meeting held?  Yes  Who was at the meeting?  patient  Palliative Care Outcomes  Clarified goals of care, Changed CPR status  Patient/Family wishes: Interventions discontinued/not started   Mechanical Ventilation, Vasopressors      Patient Active Problem List   Diagnosis Date Noted  .  DNR (do not resuscitate) discussion   . Palliative care encounter   . Goals of care, counseling/discussion   . Acute on chronic respiratory failure with hypoxia ([place]) 01/03/2017  . Demand ischemia ([place])   . Elevated troponin   . Chronic respiratory failure ([place]) 11/29/2016  . Episodic lightheadedness 08/28/2016  . Sleep difficulties 08/28/2016  . Acute on chronic combined systolic and diastolic CHF (congestive heart failure) ([place])   . Pulmonary fibrosis ([place])   . Non-ST elevation (NSTEMI) myocardial infarction ([place]) 08/08/2016  .  Lumbar radiculopathy 08/01/2016  . Left-sided low back pain without sciatica 03/28/2016  . Cephalalgia 03/10/2016  . Overactive bladder 02/21/2016  . Poor balance 02/21/2016  . Chronic lower back pain 02/21/2016  . Carotid stenosis-s/p CEA 2015 03/03/2014  . Aortic atherosclerosis ([place]) 02/04/2014  . Change in bowel habits 05/22/2011  . URINARY INCONTINENCE 12/22/2010  . ABDOMINAL BRUIT 08/10/2010  . FASTING HYPERGLYCEMIA 11/15/2009  . Depression 08/01/2009  . GERD 08/01/2009  . IRRITABLE BOWEL SYNDROME 12/27/2008  . TINNITUS, CHRONIC 11/05/2008  . HEARING LOSS, HIGH FREQUENCY 11/05/2008  . HIATAL HERNIA 08/11/2008  . OTHER NONTHROMBOCYTOPENIC PURPURAS 06/08/2008  . ORTHOSTATIC HYPOTENSION 06/08/2008  . Lung nodule 11/20/2007  . OBESITY, MILD 11/19/2007  . Hyperlipidemia 10/09/2007  . Tobacco abuse, in remission 10/09/2007  . Essential hypertension 10/09/2007  . CAD S/P percutaneous coronary angioplasty 1985 10/09/2007  . PVD (peripheral vascular disease) ([place]) 10/09/2007  . COPD mixed type ([place]) 10/09/2007  . VERTIGO 03/31/2007  . Headache 03/31/2007    Palliative Care Plan    Recommendations/Plan:  Physical therapy evaluation - to help determine Hospice eligibility.  Continue to progress with full scope treatment.  Prior to discharge we will reassess in an attempt to offer the best resources possible.  When ready D/C to home - family intends to provide 24 hour care.  Goals of Care and Additional Recommendations:  Limitations on Scope of Treatment: Full Scope Treatment  Code Status:  DNR  Prognosis:   < 6 months - due to severe coronary artery disease and severe pulmonary fibrosis / UIP   Discharge Planning:  Home with Hospice vs home health  Care plan was discussed with patient and son Linda Kelly on [phone] [facility])  Thank you for allowing the Palliative Medicine Team to assist in the care of this patient.  Total time spent:  60 min.     Greater  than 50%  of this time was spent counseling and coordinating care related to the above assessment and plan.  Imogene Burn, PA-C Palliative Medicine  Please contact Palliative MedicineTeam phone at [phone] for questions and concerns between 7 am - 7 pm.   Please see AMION for individual provider pager numbers.

## 2017-01-06 NOTE — Progress Notes (Signed)
PROGRESS NOTE    Linda Kelly  ZOX:[id number] DOB: 14-Oct-1928 DOA: 01/03/2017 PCP: Binnie Rail, MD   Brief Narrative:  83 year old female with past medical history of significant pulmonary fibrosis on chronic home oxygen from [place], multivessel CAD, combined systolic and diastolic CHF, hypertension and hyperlipidemia came to the ED with complaints of progressive shortness of breath for past 3 days. At this time she appears to be in acute CHF along with chronic progression of her pulmonary disease.   Assessment & Plan:   Principal Problem:   Acute on chronic respiratory failure with hypoxia (HCC) Active Problems:   Hyperlipidemia   Essential hypertension   CAD S/P percutaneous coronary angioplasty 1985   COPD mixed type (HCC)   Non-ST elevation (NSTEMI) myocardial infarction ([place])   Acute on chronic combined systolic and diastolic CHF (congestive heart failure) (HCC)   Pulmonary fibrosis (HCC)   DNR (do not resuscitate) discussion   Palliative care encounter   Goals of care, counseling/discussion  Acute on chronic respiratory failure with hypoxia -At this time she is requiring high levels of oxygen requiring high flow at times. -Use CPAP if needed -Incentive spirometry and wean oxygen as tolerated  Acute combined systolic and diastolic congestive heart failure stage IV with CAD -I will stop IV Lasix this morning. May be her home dose can be restarted orally tomorrow. -Daily weight and strict input and output -Cardiology following -CAD - medical management at this time.  -due to possible concerns of NSTEMI, she was on heparin drip which was stopped on 3/10  Usual interstitial pneumonitis/pulmonary fibrosis -Supplemental oxygen. Follows with Dr. Annamaria Boots outpatient -Apparently patient was supposed be on Anoro but she had not been taking it as patient thought if she is on oxygen she doesn't need that. I will consult pulmonary for further recommendations. -Palliative  care consult called this morning to establish long-term goals of care will Due to low volume lung reserve and chronicity of her pulmonary pathology -d/c Abx -Pulmonary suggestive ESR to be checked and if elevated to start her on steroids. Her ESR is 85 therefore will start her on steroids. -Continue nebulizer treatments.  Hypertension -Continue home medications when appropriate  Hyperlipidemia -Omega-3 fatty acids next line-statin  DVT prophylaxis: Lovenox  Code Status: Full Family Communication: Spoke with [name] (daughter) over the phone.  Disposition Plan: Home Consults called: Cardiology/[place] and Pulm  Consultants:   Cardiology   Pulm  Procedures:   None  Antimicrobials:   Levaquin stopped on 3/11   Subjective: Patient is doing well this morning. Her symptoms have improved after starting on steroids. She states this is the best she has felt last couple weeks or so.  Objective: Vitals:   01/06/17 0514 01/06/17 0700 01/06/17 0800 01/06/17 0827  BP:  111/71 104/65   Pulse:  97    Resp:  (!) 24 20 (!) 31  Temp:    97 F (36.1 C)  TempSrc:    Axillary  SpO2:  96%    Weight: 65.8 kg (145 lb 1 oz)   66.7 kg (147 lb 0.8 oz)  Height:        Intake/Output Summary (Last 24 hours) at 01/06/17 1241 Last data filed at 01/06/17 0940  Gross per 24 hour  Intake              346 ml  Output             1000 ml  Net             -  654 ml   Filed Weights   01/05/17 0358 01/06/17 0514 01/06/17 0827  Weight: 64.6 kg (142 lb 6.7 oz) 65.8 kg (145 lb 1 oz) 66.7 kg (147 lb 0.8 oz)    Examination:  General exam: Appears calm and comfortable  Respiratory system: diffuse corase BS but improved. On [place].  Cardiovascular system: S1 & S2 heard, RRR. No JVD, murmurs, rubs, gallops or clicks. No pedal edema. Gastrointestinal system: Abdomen is nondistended, soft and nontender. No organomegaly or masses felt. Normal bowel sounds heard. Central nervous system: Alert and oriented. No  focal neurological deficits. Extremities: Symmetric 5 x 5 power. Skin: No rashes, lesions or ulcers Psychiatry: Judgement and insight appear normal. Mood & affect appropriate.     Data Reviewed:   CBC:  Recent Labs Lab 01/03/17 1117 01/04/17 0243 01/05/17 0842 01/06/17 0420  WBC 16.4* 15.1* 12.2* 8.5  NEUTROABS 13.6*  --   --   --   HGB 9.9* 9.2* 9.4* 10.0*  HCT 31.5* 29.3* 30.5* 31.8*  MCV 96.6 96.1 95.6 94.9  PLT 211 212 225 403   Basic Metabolic Panel:  Recent Labs Lab 01/03/17 1117 01/04/17 0243 01/05/17 0842 01/06/17 0420  NA 138 136 138 137  K 4.2 3.6 3.4* 3.8  CL 103 99* 99* 96*  CO2 26 24 29 31   GLUCOSE 116* 86 101* 160*  BUN 22* 21* 22* 32*  CREATININE 0.99 0.96 0.88 1.09*  CALCIUM 8.6* 8.1* 8.5* 9.5   GFR: Estimated Creatinine Clearance: 29.9 mL/min (by C-G formula based on SCr of 1.09 mg/dL (H)). Liver Function Tests:  Recent Labs Lab 01/03/17 1117  AST 23  ALT 9*  ALKPHOS 63  BILITOT 1.1  PROT 7.2  ALBUMIN 3.4*   No results for input(s): LIPASE, AMYLASE in the last 168 hours. No results for input(s): AMMONIA in the last 168 hours. Coagulation Profile: No results for input(s): INR, PROTIME in the last 168 hours. Cardiac Enzymes:  Recent Labs Lab 01/03/17 1514 01/03/17 1932 01/04/17 0243  TROPONINI 4.04* 3.19* 2.44*   BNP (last 3 results) No results for input(s): PROBNP in the last 8760 hours. HbA1C: No results for input(s): HGBA1C in the last 72 hours. CBG: No results for input(s): GLUCAP in the last 168 hours. Lipid Profile: No results for input(s): CHOL, HDL, LDLCALC, TRIG, CHOLHDL, LDLDIRECT in the last 72 hours. Thyroid Function Tests: No results for input(s): TSH, T4TOTAL, FREET4, T3FREE, THYROIDAB in the last 72 hours. Anemia Panel: No results for input(s): VITAMINB12, FOLATE, FERRITIN, TIBC, IRON, RETICCTPCT in the last 72 hours. Sepsis Labs: No results for input(s): PROCALCITON, LATICACIDVEN in the last 168  hours.  Recent Results (from the past 240 hour(s))  MRSA PCR Screening     Status: None   Collection Time: 01/03/17  5:24 PM  Result Value Ref Range Status   MRSA by PCR NEGATIVE NEGATIVE Final    Comment:        The GeneXpert MRSA Assay (FDA approved for NASAL specimens only), is one component of a comprehensive MRSA colonization surveillance program. It is not intended to diagnose MRSA infection nor to guide or monitor treatment for MRSA infections.   Respiratory Panel by PCR     Status: None   Collection Time: 01/03/17  9:00 PM  Result Value Ref Range Status   Adenovirus NOT DETECTED NOT DETECTED Final   Coronavirus 229E NOT DETECTED NOT DETECTED Final   Coronavirus HKU1 NOT DETECTED NOT DETECTED Final   Coronavirus NL63 NOT DETECTED NOT DETECTED  Final   Coronavirus OC43 NOT DETECTED NOT DETECTED Final   Metapneumovirus NOT DETECTED NOT DETECTED Final   Rhinovirus / Enterovirus NOT DETECTED NOT DETECTED Final   Influenza A NOT DETECTED NOT DETECTED Final   Influenza B NOT DETECTED NOT DETECTED Final   Parainfluenza Virus 1 NOT DETECTED NOT DETECTED Final   Parainfluenza Virus 2 NOT DETECTED NOT DETECTED Final   Parainfluenza Virus 3 NOT DETECTED NOT DETECTED Final   Parainfluenza Virus 4 NOT DETECTED NOT DETECTED Final   Respiratory Syncytial Virus NOT DETECTED NOT DETECTED Final   Bordetella pertussis NOT DETECTED NOT DETECTED Final   Chlamydophila pneumoniae NOT DETECTED NOT DETECTED Final   Mycoplasma pneumoniae NOT DETECTED NOT DETECTED Final         Radiology Studies: No results found.      Scheduled Meds: . aspirin EC  81 mg Oral QHS  . atorvastatin  40 mg Oral q1800  . clopidogrel  75 mg Oral Daily  . famotidine  10 mg Oral Daily  . furosemide  40 mg Intravenous BID  . methylPREDNISolone (SOLU-MEDROL) injection  60 mg Intravenous Q6H  . montelukast  10 mg Oral QHS  . omega-3 acid ethyl esters  1 g Oral BID  . potassium chloride  5.0667 mEq Oral  Daily  . sertraline  50 mg Oral Daily  . sodium chloride flush  3 mL Intravenous Q12H   Continuous Infusions:    LOS: 3 days    Time spent: 35 mins     Ankit Arsenio Loader, MD [facility] Pager [phone]   If 7PM-7AM, please contact night-coverage [email or web address] Password [facility] 01/06/2017, 12:41 PM

## 2017-01-07 ENCOUNTER — Inpatient Hospital Stay (HOSPITAL_COMMUNITY): Payer: PPO

## 2017-01-07 DIAGNOSIS — J449 Chronic obstructive pulmonary disease, unspecified: Secondary | ICD-10-CM

## 2017-01-07 DIAGNOSIS — I10 Essential (primary) hypertension: Secondary | ICD-10-CM

## 2017-01-07 LAB — BASIC METABOLIC PANEL
ANION GAP: 12 (ref 5–15)
BUN: 46 mg/dL — ABNORMAL HIGH (ref 6–20)
CO2: 29 mmol/L (ref 22–32)
Calcium: 9.2 mg/dL (ref 8.9–10.3)
Chloride: 95 mmol/L — ABNORMAL LOW (ref 101–111)
Creatinine, Ser: 1.36 mg/dL — ABNORMAL HIGH (ref 0.44–1.00)
GFR calc Af Amer: 40 mL/min — ABNORMAL LOW (ref >60)
GFR, EST NON AFRICAN AMERICAN: 34 mL/min — AB (ref >60)
Glucose, Bld: 159 mg/dL — ABNORMAL HIGH (ref 65–99)
POTASSIUM: 4 mmol/L (ref 3.5–5.1)
SODIUM: 136 mmol/L (ref 135–145)

## 2017-01-07 LAB — GLUCOSE, CAPILLARY
GLUCOSE-CAPILLARY: 143 mg/dL — AB (ref 65–99)
Glucose-Capillary: 122 mg/dL — ABNORMAL HIGH (ref 65–99)

## 2017-01-07 LAB — CBC
HCT: 30.5 % — ABNORMAL LOW (ref 36.0–46.0)
Hemoglobin: 9.7 g/dL — ABNORMAL LOW (ref 12.0–15.0)
MCH: 29.8 pg (ref 26.0–34.0)
MCHC: 31.8 g/dL (ref 30.0–36.0)
MCV: 93.6 fL (ref 78.0–100.0)
Platelets: 293 10*3/uL (ref 150–400)
RBC: 3.26 MIL/uL — AB (ref 3.87–5.11)
RDW: 14.7 % (ref 11.5–15.5)
WBC: 20.4 10*3/uL — AB (ref 4.0–10.5)

## 2017-01-07 MED ORDER — ZOLPIDEM TARTRATE 5 MG PO TABS
5.0000 mg | ORAL_TABLET | Freq: Every evening | ORAL | Status: DC | PRN
  Administered 2017-01-07 – 2017-01-08 (×2): 5 mg via ORAL
  Filled 2017-01-07 (×2): qty 1

## 2017-01-07 MED ORDER — ZOLPIDEM TARTRATE 5 MG PO TABS
5.0000 mg | ORAL_TABLET | Freq: Once | ORAL | Status: AC
  Administered 2017-01-07: 5 mg via ORAL
  Filled 2017-01-07: qty 1

## 2017-01-07 MED ORDER — ACETAMINOPHEN 325 MG PO TABS: 650.0000 mg | ORAL_TABLET | ORAL | Status: DC | PRN

## 2017-01-07 NOTE — Evaluation (Signed)
Physical Therapy Evaluation Patient Details Name: Linda Kelly MRN: [id number] DOB: 1928/01/14 Today's Date: 01/07/2017   History of Present Illness  pt presents with Acute on Chronic Respiratory Failure and NSTEMI.  pt with a hx of Pulmonary fibrosis, HF, CAD, Depression, Anxiety, PVD, COPD, Vertigo, Esophageal CA, MI, and CVA.    Clinical Impression  Pt very pleasant and eager for OOB mobility.  Pt seems very realistic about her mobility and abilities given her respiratory status.  Pt was on 6L O2 during activity and O2 sats ranged from 83 - 94%.  Pt indicates that her family will be arranging 24hr care for her at home.  Discussed home equipment and pt thinks that she has a W/C, but will have family double check that it is still functional.  Feel pt would benefit from a rollator style of walker to allow pt to use the seat when needed for rest and energy conservation.  Will continue to follow to A with transition to home.      Follow Up Recommendations Home health PT;Supervision/Assistance - 24 hour    Equipment Recommendations  Wheelchair (measurements PT);Wheelchair cushion (measurements PT) Agricultural consultant)    Recommendations for Other Services       Precautions / Restrictions Precautions Precautions: Fall Precaution Comments: Watch O2 Restrictions Weight Bearing Restrictions: No      Mobility  Bed Mobility Overal bed mobility: Needs Assistance Bed Mobility: Supine to Sit     Supine to sit: Min guard;HOB elevated     General bed mobility comments: pt effortful with coming to sitting and heavy reliance of UE support.  O2 sats decrease from upper 90s to 87-89% on 5L O2.    Transfers Overall transfer level: Needs assistance Equipment used: Rolling walker (2 wheeled) Transfers: Sit to/from Stand Sit to Stand: Min guard         General transfer comment: cues for UE use only.  pt demonstrates good technique.    Ambulation/Gait Ambulation/Gait assistance: Min  guard Ambulation Distance (Feet): 100 Feet Assistive device: Rolling walker (2 wheeled) Gait Pattern/deviations: Step-through pattern;Decreased stride length     General Gait Details: pt able to ambulate in the hallway while on 6L O2.  pt needed 3 standing rest breaks due to low sats in mid 80s, except for last standing rest break when sats decreased to 83% on 6L O2.  cues for less talking and more focus on breathing as pt is conversant with PT during ambulation.    Stairs            Wheelchair Mobility    Modified Rankin (Stroke Patients Only)       Balance Overall balance assessment: Needs assistance Sitting-balance support: Single extremity supported;Feet supported Sitting balance-Leahy Scale: Fair Sitting balance - Comments: Seems to use UE support more for energy conservation vs actual balance.     Standing balance support: Single extremity supported;Bilateral upper extremity supported;During functional activity Standing balance-Leahy Scale: Poor                               Pertinent Vitals/Pain Pain Assessment: No/denies pain    Home Living Family/patient expects to be discharged to:: Private residence Living Arrangements: Alone Available Help at Discharge: Family;Available 24 hours/day Type of Home: House (townhome) Home Access: Stairs to enter Entrance Stairs-Rails: None Entrance Stairs-Number of Steps: 2 Home Layout: One level Home Equipment: Environmental consultant - 2 wheels;Cane - single point;Shower seat (pt thinks she has an  old W/C, family to check. )      Prior Function Level of Independence: Independent with assistive device(s)         Comments: pt uses can vs RW depending on how she feels.     Hand Dominance        Extremity/Trunk Assessment   Upper Extremity Assessment Upper Extremity Assessment: Generalized weakness    Lower Extremity Assessment Lower Extremity Assessment: Generalized weakness    Cervical / Trunk  Assessment Cervical / Trunk Assessment: Kyphotic  Communication   Communication: No difficulties  Cognition Arousal/Alertness: Awake/alert Behavior During Therapy: WFL for tasks assessed/performed Overall Cognitive Status: Within Functional Limits for tasks assessed                      General Comments      Exercises     Assessment/Plan    PT Assessment Patient needs continued PT services  PT Problem List Decreased strength;Decreased activity tolerance;Decreased balance;Decreased mobility;Decreased knowledge of use of DME;Cardiopulmonary status limiting activity       PT Treatment Interventions DME instruction;Gait training;Stair training;Functional mobility training;Therapeutic activities;Therapeutic exercise;Balance training;Patient/family education    PT Goals (Current goals can be found in the Care Plan section)  Acute Rehab PT Goals Patient Stated Goal: Get home PT Goal Formulation: With patient Time For Goal Achievement: 01/21/17 Potential to Achieve Goals: Fair    Frequency Min 3X/week   Barriers to discharge        Co-evaluation               End of Session Equipment Utilized During Treatment: Gait belt;Oxygen Activity Tolerance: Patient limited by fatigue Patient left: in chair;with call bell/phone within reach;with chair alarm set Nurse Communication: Mobility status PT Visit Diagnosis: Unsteadiness on feet (R26.81);Muscle weakness (generalized) (M62.81)         Time: 6546-5035 PT Time Calculation (min) (ACUTE ONLY): 44 min   Charges:   PT Evaluation $PT Eval Moderate Complexity: 1 Procedure PT Treatments $Gait Training: 8-22 mins $Therapeutic Activity: 8-22 mins   PT G CodesCatarina Hartshorn, [place]  [phone] 01/07/2017, 1:57 PM

## 2017-01-07 NOTE — Care Management Important Message (Signed)
Important Message  Patient Details  Name: Linda Kelly MRN: [id number] Date of Birth: [birth year]   Medicare Important Message Given:  Yes    [name] Ab[place] 01/07/2017, 1:50 PM

## 2017-01-07 NOTE — Progress Notes (Signed)
Progress Note  Patient Name: Linda Kelly Date of Encounter: 01/07/2017  Primary Cardiologist: Dr. Stanford Breed  Subjective   Breathing improved. Now on 5L [place]. Denies any recent chest discomfort or palpitations.   Inpatient Medications    Scheduled Meds: . aspirin EC  81 mg Oral QHS  . atorvastatin  40 mg Oral q1800  . clopidogrel  75 mg Oral Daily  . famotidine  10 mg Oral Daily  . methylPREDNISolone (SOLU-MEDROL) injection  60 mg Intravenous Q6H  . montelukast  10 mg Oral QHS  . omega-3 acid ethyl esters  1 g Oral BID  . potassium chloride  5.0667 mEq Oral Daily  . sertraline  50 mg Oral Daily  . sodium chloride flush  3 mL Intravenous Q12H   Continuous Infusions:  PRN Meds: sodium chloride, acetaminophen, ipratropium-albuterol, ondansetron (ZOFRAN) IV, sodium chloride flush   Vital Signs    Vitals:   01/07/17 0458 01/07/17 0700 01/07/17 0738 01/07/17 0810  BP:  118/69  95/81  Pulse:  80  86  Resp:  (!) 29  17  Temp:    97.3 F (36.3 C)  TempSrc:    Oral  SpO2:  100%  95%  Weight: 145 lb 4.5 oz (65.9 kg)  146 lb 9.7 oz (66.5 kg)   Height:   4\' 10"  (1.473 m)     Intake/Output Summary (Last 24 hours) at 01/07/17 1002 Last data filed at 01/07/17 0939  Gross per 24 hour  Intake              363 ml  Output              425 ml  Net              -62 ml   Filed Weights   01/06/17 0827 01/07/17 0458 01/07/17 0738  Weight: 147 lb 0.8 oz (66.7 kg) 145 lb 4.5 oz (65.9 kg) 146 lb 9.7 oz (66.5 kg)    Telemetry    NSR, HR in 80's to low-100's.  - Personally Reviewed  ECG    No new tracings.   Physical Exam   General: Well developed, well nourished, elderly Caucasian female appearing in no acute distress. Head: Normocephalic, atraumatic.  Neck: Supple without bruits, JVD not elevated. Lungs:  Resp regular and unlabored, mild rales at bases bilaterally. Heart: RRR, S1, S2, no S3, S4, or murmur; no rub. Abdomen: Soft, non-tender, non-distended with normoactive  bowel sounds. No hepatomegaly. No rebound/guarding. No obvious abdominal masses. Extremities: No clubbing, cyanosis, or edema. Distal pedal pulses are 2+ bilaterally. Neuro: Alert and oriented X 3. Moves all extremities spontaneously. Psych: Normal affect.  Labs    Chemistry Recent Labs Lab 01/03/17 1117  01/05/17 0842 01/06/17 0420 01/07/17 0402  NA 138  < > 138 137 136  K 4.2  < > 3.4* 3.8 4.0  CL 103  < > 99* 96* 95*  CO2 26  < > 29 31 29   GLUCOSE 116*  < > 101* 160* 159*  BUN 22*  < > 22* 32* 46*  CREATININE 0.99  < > 0.88 1.09* 1.36*  CALCIUM 8.6*  < > 8.5* 9.5 9.2  PROT 7.2  --   --   --   --   ALBUMIN 3.4*  --   --   --   --   AST 23  --   --   --   --   ALT 9*  --   --   --   --  ALKPHOS 63  --   --   --   --   BILITOT 1.1  --   --   --   --   GFRNONAA 50*  < > 58* 45* 34*  GFRAA 58*  < > >60 52* 40*  ANIONGAP 9  < > 10 10 12   < > = values in this interval not displayed.   Hematology Recent Labs Lab 01/05/17 0842 01/06/17 0420 01/07/17 0402  WBC 12.2* 8.5 20.4*  RBC 3.19* 3.35* 3.26*  HGB 9.4* 10.0* 9.7*  HCT 30.5* 31.8* 30.5*  MCV 95.6 94.9 93.6  MCH 29.5 29.9 29.8  MCHC 30.8 31.4 31.8  RDW 15.1 14.8 14.7  PLT 225 263 293    Cardiac Enzymes Recent Labs Lab 01/03/17 1514 01/03/17 1932 01/04/17 0243  TROPONINI 4.04* 3.19* 2.44*    Recent Labs Lab 01/03/17 1128  TROPIPOC 2.15*     BNP Recent Labs Lab 01/03/17 1117  BNP 960.4*     DDimer No results for input(s): DDIMER in the last 168 hours.   Radiology    Dg Chest 2 View  Result Date: 01/07/2017 CLINICAL DATA:  Shortness of breath.  Pulmonary fibrosis. EXAM: CHEST  2 VIEW COMPARISON:  01/04/2017. FINDINGS: Normal sized heart with an interval decrease in size. Decreased prominence of the pulmonary vasculature and interstitial markings. Residual interstitial prominence throughout both lungs. Diffuse osteopenia and bilateral shoulder degenerative changes. No pleural fluid. IMPRESSION:  Resolved changes of congestive heart failure with residual chronic interstitial lung disease. Electronically Signed   By: Claudie Revering M.D.   On: 01/07/2017 08:38    Cardiac Studies   Echocardiogram: 08/10/2016 Study Conclusions  - Left ventricle: The cavity size was normal. Wall thickness was   normal. Systolic function was mildly to moderately reduced. The   estimated ejection fraction was in the range of 40% to 45%.   Moderate hypokinesis of the basal-midanterolateral and   inferolateral myocardium; consistent with ischemia in the   distribution of the left circumflex coronary artery. Features are   consistent with a pseudonormal left ventricular filling pattern,   with concomitant abnormal relaxation and increased filling   pressure (grade 2 diastolic dysfunction). - Mitral valve: Moderately calcified annulus. There was mild to   moderate regurgitation directed centrally. - Left atrium: The atrium was mildly dilated  Cardiac Catheterization: 93/81/0175  Complicated procedure that demonstrated high-grade obstruction in the right iliac artery preventing the procedure from being completed and requiring crossover to the left femoral artery.  Inability to advance the pulmonary artery catheter into the pulmonary artery or capillary wedge position. RV systolic pressure is mildly to moderately elevated.  Severe multivessel coronary disease with chronic total occlusion of the mid RCA collateralized by right to right and left-to-right sources.  Total occlusion of the mid LAD beyond a large first diagonal. Left left collaterals are noted.  99% stenosis in the mid to distal native circumflex beyond a very tortuous segment. PCI was not attempted. Distal territory is relatively small.  Inferior wall severe hypokinesis involving the basal to mid segment. Anterior and apical mild hypokinesis is noted. Ejection fraction is estimated to be 35-45%. Presentation was that of acute on chronic  combined systolic and diastolic heart failure.  RECOMMENDATIONS:   Medical therapy with optimization of heart failure regimen.  Antiplatelet therapy.  Given the patient's age, I do not believe surgical revascularization is warranted/prudent.  Only PCI target would be the mid circumflex, which I do not believe would change her  clinical status (not having ischemic pain or evidence of ongoing infarction). There would be relatively high risk of inability to deliver stent and/or primary failure due to tortuosity.  Patient Profile     83 y.o. female w/ PMH of CAD (severe, multivessel with CTO of RCA, CTO of mild-LAD beyond 1st Diag, and 99% mid-distal LCx disease by cath in 07/2016 - not felt to be a surgical candidate), pulmonary fibrosis, carotid artery disease, HLD, and PVD admitted with worsening dyspnea. Cardiology consulted for CHF and elevated troponin.   Assessment & Plan    1. Acute on Chronic Combined Systolic and Diastolic CHF/ Ischemic Cardiomyopathy - BNP elevated at 960 on admission. Echo in 07/2016 showed a reduced EF of 40-45% with Grade 2 DD.  - diuresed with IV Lasix 40mg  BID with a recorded net output of -2.7L, yet weights have been stable at 146 lbs. IV Lasix discontinued in the setting of her improved respiratory status and AKI. She was on PO Lasix 40mg  daily at home. Plan to resume tomorrow if creatinine stable.  - not on BB or ACE-I/ARB secondary to hypotension.   2. Type 2 NSTEMI/ CAD - she has known severe multivessel with CTO of RCA, CTO of mild-LAD beyond 1st Diag, and 99% mid-distal LCx disease by cath in 07/2016. Was not felt to be a surgical candidate secondary to her age and co-morbidities.  - peak troponin of 4.04, trending down to 2.44 on 3/9. Medically treated with Heparin for > 48 hours. Not a surgical candidate or good PCI candidate based on cath in 07/2016. Agree with Palliative Care consultation.  - continue ASA, Plavix, and statin.   3. Pulmonary  Fibrosis - initially on 15L, at 6L this AM. On 2.5L [place] prior to admission.  - Pulmonology following.   4. HTN - BP at 95/81 - 140/76 in the past 24 hours (one recording at 202/191 not accurate).  - PTA anti-hypertensive medications currently held with episodes of hypotension.   5. AKI - creatinine 0.99 on admission, trending up to 1.36 this AM. Lasix has been discontinued.   Arna Medici , PA-C 10:02 AM 01/07/2017 Pager: [phone]  I have seen and examined the patient along with Erma Heritage , PA-C.  I have reviewed the chart, notes and new data.  I agree with PA/NP's note.  Key new complaints: breathing at baseline, no abdominal bloating Key examination changes: bilateral dry crackles, no JVD, no S3, no edema Key new findings / data: creat increasing.  PLAN: Dyspnea improved, no angina. She has a very low threshold for decompensation. Needs to perform daily weights and adjust furosemide does (double if she gains > 3lb). Last week she made chicken soup with 2 boxes of broth. The day before admission she had two slices of pizza. Discussed sodium restriction and the need to avoid processed foods with high sodium content. Would restart her furosemide at home dose 40 mg daily starting in AM.  Sanda Klein, MD, [place] [phone] 01/07/2017, 11:34 AM

## 2017-01-07 NOTE — Progress Notes (Signed)
Called Elle on 3E to give report on patient. Ellle asked to call back in 10 min

## 2017-01-07 NOTE — Progress Notes (Signed)
PCCM Consult Note  Admission date: 01/03/2017 Consult date: 01/04/2017 Referring provider: Dr. Reesa Chew, [facility]  CC: Shortness of the breathing  HPI: 83 yo female with progressive dyspnea, hypoxia 2nd to IPF with acute pneumonitis, combined CHF.  She is followed by Dr. Annamaria Boots.  Subjective: States she is much improved. Much less shortness of breath  Vital signs: BP 139/74   Pulse 86   Temp 97.3 F (36.3 C) (Oral)   Resp (!) 23   Ht _0  (1.473 m)   Wt 146 lb 9.7 oz (66.5 kg)   SpO2 100%   BMI 30.64 kg/m   Intake/output: I/O last 3 completed shifts: In: 70 [P.O.:240; I.V.:6] Out: 1075 [Urine:1075]  General: Alert oriented x 3 and apporpriate Neuro: A&O x 3, MAE x 4, appropriate HEENT:Normocephalic, atraumatic, MM pink and moist Cardiac: RRR, 2/6 SM Chest: crackles throughout, with good air movement Abd: soft, non tender, BS + Ext: No edema or deformities noted Skin: intact warm and dry with no rash noted.   CMP Latest Ref Rng & Units 01/07/2017 01/06/2017 01/05/2017  Glucose 65 - 99 mg/dL 159(H) 160(H) 101(H)  BUN 6 - 20 mg/dL 46(H) 32(H) 22(H)  Creatinine 0.44 - 1.00 mg/dL 1.36(H) 1.09(H) 0.88  Sodium 135 - 145 mmol/L 136 137 138  Potassium 3.5 - 5.1 mmol/L 4.0 3.8 3.4(L)  Chloride 101 - 111 mmol/L 95(L) 96(L) 99(L)  CO2 22 - 32 mmol/L _1 Calcium 8.9 - 10.3 mg/dL 9.2 9.5 8.5(L)  Total Protein 6.5 - 8.1 g/dL - - -  Total Bilirubin 0.3 - 1.2 mg/dL - - -  Alkaline Phos 38 - 126 U/L - - -  AST 15 - 41 U/L - - -  ALT 14 - 54 U/L - - -     CBC Latest Ref Rng & Units 01/07/2017 01/06/2017 01/05/2017  WBC 4.0 - 10.5 K/uL 20.4(H) 8.5 12.2(H)  Hemoglobin 12.0 - 15.0 g/dL 9.7(L) 10.0(L) 9.4(L)  Hematocrit 36.0 - 46.0 % 30.5(L) 31.8(L) 30.5(L)  Platelets 150 - 400 K/uL 293 263 225     ABG    Component Value Date/Time   PHART 7.427 01/03/2017 1144   PCO2ART 38.0 01/03/2017 1144   PO2ART 105.0 01/03/2017 1144   HCO3 25.0 01/03/2017 1144   TCO2 26 01/03/2017 1144   O2SAT 98.0 01/03/2017 1144     CBG (last 3)  No results for input(s): GLUCAP in the last 72 hours.   Imaging: Dg Chest 2 View  Result Date: 01/07/2017 CLINICAL DATA:  Shortness of breath.  Pulmonary fibrosis. EXAM: CHEST  2 VIEW COMPARISON:  01/04/2017. FINDINGS: Normal sized heart with an interval decrease in size. Decreased prominence of the pulmonary vasculature and interstitial markings. Residual interstitial prominence throughout both lungs. Diffuse osteopenia and bilateral shoulder degenerative changes. No pleural fluid. IMPRESSION: Resolved changes of congestive heart failure with residual chronic interstitial lung disease. Electronically Signed   By: Claudie Revering M.D.   On: 01/07/2017 08:38     Studies: PFT 02/19/13 >> FEV1 1.40 (119%), FEV1% 86, TLC 3.31 (94%), DLCO 43% Echo 08/10/16 >> EF 40 to 45%, grade 2 DD, mild/mod MR HR CT chest 11/28/16 >> extensive subpleural reticulation and GGO, traction BTX, mild honeycombing  ESR 01/04/17 >> 84  Antibiotics: Levaquin 3/09 >> 3/11  Cultures: Respiratory viral panel 3/08 >> negative  Events: 3/08 Admit, cardiology consulted 3/10 Start steroids  Summary: 83 yo female with acute on chronic hypoxic respiratory failure.  This is likely related to acute on  chronic combined CHF with NSTEMI, acute on chronic pulmonary edema, and progressive hypoxia triggering a viscous cycle.  She has elevated ESR and could also have acute pneumonitis.  She clearly has very little reserve, and wouldn't take much for her to get into trouble.  Assessment/plan:  Acute on chronic hypoxic respiratory failure. - titrate oxygen to keep SpO2 90 to 95% - Home oxygen is 2.5 L  Acute on chronic combined CHF with acute on chronic pulmonary edema. NSTEMI. - negative fluid balance as able  Idiopathic pulmonary fibrosis with acute pneumonitis. - continue  solumedrol to 60 mg q6h  Possible bronchitis. - Abx per primary team  Goals of care. - Dr. Halford Chessman  explained that her chronic cardiac and pulmonary problems will continue to progress - might benefit from palliative care assessment   She is doing well,saturations are 95% on 3.5 L at rest. She  will need to follow up with Pulmonary within a week of discharge. Continue Steroids for now.     Magdalen Spatz, AGACNP-BC [facility] 01/07/2017, 1:54 PM Pager:  [phone]

## 2017-01-07 NOTE — Progress Notes (Signed)
Chaplain Note:  Referral for support.  Patient is a very pleasant woman who described her long history of heart and pulmonary problems. She indicated that she is feeling significantly better than she was 4 days ago. She has several children and 11 grandchildren - she spoke of all of them with pride for how they are living their lives and what they are doing. She especially noted a visit from her grandson, a captain in [employer], who arrived from an overseas assignment and came by to visit her. THis seem to make her day.   She is a member of [employer] where she has been a member for many years. Originally from [place] she came to [facility] when her husband's business brought them here.  She has good support from her priest and her parish.  Wells Guiles CHaplain

## 2017-01-07 NOTE — Progress Notes (Addendum)
Linda Kelly TEAM 1 - Stepdown/ICU TEAM  Linda Kelly  KZS:[id number] DOB: Jun 01, 1928 DOA: 01/03/2017 PCP: Binnie Rail, MD    Brief Narrative:  83 year old female with history of pulmonary fibrosis on chronic home oxygen due to exposure at the [facility] site, multivessel CAD, combined systolic and diastolic CHF, hypertension, and hyperlipidemia who came to the ED with complaints of progressive shortness of breath for 3 days. She appeared to be in acute CHF along with chronic progression of her pulmonary disease.  Subjective: Patient states she is feeling much better.  She has begun to work with physical therapy.  She denies chest pain fevers chills nausea vomiting or abdominal pain.  Assessment & Plan:  Acute on chronic respiratory failure with hypoxia Continue to wean oxygen as possible toward home dose of 2.5 L  Acute combined systolic and diastolic congestive heart failure  Cardiology following - EF 40-45% w/ grade 2 DD via TTE Oct 2017 - 2 resume oral Lasix in a.m. Filed Weights   01/06/17 0827 01/07/17 0458 01/07/17 0738  Weight: 66.7 kg (147 lb 0.8 oz) 65.9 kg (145 lb 4.5 oz) 66.5 kg (146 lb 9.7 oz)     Acute kidney injury -due to diuresis for above - diuresis on hold today - recheck in AM   Recent Labs Lab 01/03/17 1117 01/04/17 0243 01/05/17 0842 01/06/17 0420 01/07/17 0402  CREATININE 0.99 0.96 0.88 1.09* 1.36*    Severe multivessel CAD w/ acute NSTEMI  - medical management at this time w/ ASA, Plavix, and statin  Usual interstitial pneumonitis / pulmonary fibrosis -requires 2.5L [place] O2 at home  -supposed be on Anoro but she had not been taking it  -continue steroids as suggested by PCCM   Hypertension Blood pressure is reasonably well controlled at present  Hyperlipidemia Cont Omega-3 fatty acids + statin  DVT prophylaxis: lovenox  Code Status: NO INTUBATION - NO CPR  Family Communication: no family present at time of exam  Disposition Plan:  stable for transfer to CHF tele bed  Consultants:  PCCM Cardiology  Palliative Care   Procedures: none  Antimicrobials:  levaquin 3/8 > 3/10  Objective: Blood pressure 136/77, pulse 85, temperature 97.3 F (36.3 C), temperature source Oral, resp. rate (!) 24, height 4\' 10"  (1.473 m), weight 66.5 kg (146 lb 9.7 oz), SpO2 97 %.  Intake/Output Summary (Last 24 hours) at 01/07/17 1613 Last data filed at 01/07/17 1300  Gross per 24 hour  Intake              603 ml  Output              625 ml  Net              -22 ml   Filed Weights   01/06/17 0827 01/07/17 0458 01/07/17 0738  Weight: 66.7 kg (147 lb 0.8 oz) 65.9 kg (145 lb 4.5 oz) 66.5 kg (146 lb 9.7 oz)    Examination: General: No acute respiratory distress at rest Lungs: Fine crackles diffusely with no wheezing Cardiovascular: Regular rate and rhythm without murmur gallop or rub normal S1 and S2 Abdomen: Nontender, nondistended, soft, bowel sounds positive, no rebound, no ascites, no appreciable mass Extremities: No significant cyanosis, clubbing, or edema bilateral lower extremities  CBC:  Recent Labs Lab 01/03/17 1117 01/04/17 0243 01/05/17 0842 01/06/17 0420 01/07/17 0402  WBC 16.4* 15.1* 12.2* 8.5 20.4*  NEUTROABS 13.6*  --   --   --   --  HGB 9.9* 9.2* 9.4* 10.0* 9.7*  HCT 31.5* 29.3* 30.5* 31.8* 30.5*  MCV 96.6 96.1 95.6 94.9 93.6  PLT 211 212 225 263 128   Basic Metabolic Panel:  Recent Labs Lab 01/03/17 1117 01/04/17 0243 01/05/17 0842 01/06/17 0420 01/07/17 0402  NA 138 136 138 137 136  K 4.2 3.6 3.4* 3.8 4.0  CL 103 99* 99* 96* 95*  CO2 26 24 29 31 29   GLUCOSE 116* 86 101* 160* 159*  BUN 22* 21* 22* 32* 46*  CREATININE 0.99 0.96 0.88 1.09* 1.36*  CALCIUM 8.6* 8.1* 8.5* 9.5 9.2   GFR: Estimated Creatinine Clearance: 24 mL/min (by C-G formula based on SCr of 1.36 mg/dL (H)).  Liver Function Tests:  Recent Labs Lab 01/03/17 1117  AST 23  ALT 9*  ALKPHOS 63  BILITOT 1.1  PROT 7.2    ALBUMIN 3.4*    Cardiac Enzymes:  Recent Labs Lab 01/03/17 1514 01/03/17 1932 01/04/17 0243  TROPONINI 4.04* 3.19* 2.44*    HbA1C: Hgb A1c MFr Bld  Date/Time Value Ref Range Status  08/09/2016 01:40 AM 5.2 4.8 - 5.6 % Final    Comment:    (NOTE)         Pre-diabetes: 5.7 - 6.4         Diabetes: >6.4         Glycemic control for adults with diabetes: <7.0   02/28/2016 10:46 AM 5.4 4.6 - 6.5 % Final    Comment:    Glycemic Control Guidelines for People with Diabetes:Non Diabetic:  <6%Goal of Therapy: <7%Additional Action Suggested:  >8%     CBG: No results for input(s): GLUCAP in the last 168 hours.  Recent Results (from the past 240 hour(s))  MRSA PCR Screening     Status: None   Collection Time: 01/03/17  5:24 PM  Result Value Ref Range Status   MRSA by PCR NEGATIVE NEGATIVE Final    Comment:        The GeneXpert MRSA Assay (FDA approved for NASAL specimens only), is one component of a comprehensive MRSA colonization surveillance program. It is not intended to diagnose MRSA infection nor to guide or monitor treatment for MRSA infections.   Respiratory Panel by PCR     Status: None   Collection Time: 01/03/17  9:00 PM  Result Value Ref Range Status   Adenovirus NOT DETECTED NOT DETECTED Final   Coronavirus 229E NOT DETECTED NOT DETECTED Final   Coronavirus HKU1 NOT DETECTED NOT DETECTED Final   Coronavirus NL63 NOT DETECTED NOT DETECTED Final   Coronavirus OC43 NOT DETECTED NOT DETECTED Final   Metapneumovirus NOT DETECTED NOT DETECTED Final   Rhinovirus / Enterovirus NOT DETECTED NOT DETECTED Final   Influenza A NOT DETECTED NOT DETECTED Final   Influenza B NOT DETECTED NOT DETECTED Final   Parainfluenza Virus 1 NOT DETECTED NOT DETECTED Final   Parainfluenza Virus 2 NOT DETECTED NOT DETECTED Final   Parainfluenza Virus 3 NOT DETECTED NOT DETECTED Final   Parainfluenza Virus 4 NOT DETECTED NOT DETECTED Final   Respiratory Syncytial Virus NOT DETECTED  NOT DETECTED Final   Bordetella pertussis NOT DETECTED NOT DETECTED Final   Chlamydophila pneumoniae NOT DETECTED NOT DETECTED Final   Mycoplasma pneumoniae NOT DETECTED NOT DETECTED Final     Scheduled Meds: . aspirin EC  81 mg Oral QHS  . atorvastatin  40 mg Oral q1800  . clopidogrel  75 mg Oral Daily  . famotidine  10 mg Oral Daily  .  methylPREDNISolone (SOLU-MEDROL) injection  60 mg Intravenous Q6H  . montelukast  10 mg Oral QHS  . omega-3 acid ethyl esters  1 g Oral BID  . potassium chloride  5.0667 mEq Oral Daily  . sertraline  50 mg Oral Daily  . sodium chloride flush  3 mL Intravenous Q12H     LOS: 4 days   Cherene Altes, MD [facility] Office  [phone] Pager - Text Page per Amion as per below:  On-Call/Text Page:      [email or web address]      password [email or web address]  If 7PM-7AM, please contact night-coverage [email or web address] Password [facility] 01/07/2017, 4:13 PM

## 2017-01-08 ENCOUNTER — Inpatient Hospital Stay (HOSPITAL_COMMUNITY): Payer: PPO

## 2017-01-08 DIAGNOSIS — R6889 Other general symptoms and signs: Secondary | ICD-10-CM | POA: Diagnosis present

## 2017-01-08 DIAGNOSIS — R0689 Other abnormalities of breathing: Secondary | ICD-10-CM

## 2017-01-08 DIAGNOSIS — Z515 Encounter for palliative care: Secondary | ICD-10-CM

## 2017-01-08 DIAGNOSIS — D649 Anemia, unspecified: Secondary | ICD-10-CM

## 2017-01-08 DIAGNOSIS — T380X5A Adverse effect of glucocorticoids and synthetic analogues, initial encounter: Secondary | ICD-10-CM | POA: Diagnosis present

## 2017-01-08 DIAGNOSIS — J189 Pneumonia, unspecified organism: Secondary | ICD-10-CM | POA: Diagnosis present

## 2017-01-08 DIAGNOSIS — R739 Hyperglycemia, unspecified: Secondary | ICD-10-CM

## 2017-01-08 DIAGNOSIS — F411 Generalized anxiety disorder: Secondary | ICD-10-CM | POA: Diagnosis present

## 2017-01-08 DIAGNOSIS — D72829 Elevated white blood cell count, unspecified: Secondary | ICD-10-CM

## 2017-01-08 LAB — COMPREHENSIVE METABOLIC PANEL
ALT: 9 U/L — AB (ref 14–54)
AST: 16 U/L (ref 15–41)
Albumin: 3.1 g/dL — ABNORMAL LOW (ref 3.5–5.0)
Alkaline Phosphatase: 60 U/L (ref 38–126)
Anion gap: 10 (ref 5–15)
BUN: 41 mg/dL — AB (ref 6–20)
CHLORIDE: 99 mmol/L — AB (ref 101–111)
CO2: 29 mmol/L (ref 22–32)
CREATININE: 1.09 mg/dL — AB (ref 0.44–1.00)
Calcium: 8.8 mg/dL — ABNORMAL LOW (ref 8.9–10.3)
GFR calc Af Amer: 52 mL/min — ABNORMAL LOW (ref >60)
GFR calc non Af Amer: 45 mL/min — ABNORMAL LOW (ref >60)
Glucose, Bld: 157 mg/dL — ABNORMAL HIGH (ref 65–99)
Potassium: 4.1 mmol/L (ref 3.5–5.1)
SODIUM: 138 mmol/L (ref 135–145)
Total Bilirubin: 0.7 mg/dL (ref 0.3–1.2)
Total Protein: 6.3 g/dL — ABNORMAL LOW (ref 6.5–8.1)

## 2017-01-08 LAB — CBC
HCT: 29.1 % — ABNORMAL LOW (ref 36.0–46.0)
Hemoglobin: 9.4 g/dL — ABNORMAL LOW (ref 12.0–15.0)
MCH: 30.5 pg (ref 26.0–34.0)
MCHC: 32.3 g/dL (ref 30.0–36.0)
MCV: 94.5 fL (ref 78.0–100.0)
PLATELETS: 322 10*3/uL (ref 150–400)
RBC: 3.08 MIL/uL — ABNORMAL LOW (ref 3.87–5.11)
RDW: 15 % (ref 11.5–15.5)
WBC: 20.4 10*3/uL — ABNORMAL HIGH (ref 4.0–10.5)

## 2017-01-08 LAB — RHEUMATOID FACTOR: Rhuematoid fact SerPl-aCnc: 10.1 IU/mL (ref 0.0–13.9)

## 2017-01-08 LAB — CYCLIC CITRUL PEPTIDE ANTIBODY, IGG/IGA: CCP Antibodies IgG/IgA: >250 units — ABNORMAL HIGH (ref 0–19)

## 2017-01-08 LAB — ANTINUCLEAR ANTIBODIES, IFA: ANTINUCLEAR ANTIBODIES, IFA: NEGATIVE

## 2017-01-08 LAB — ANTI-SCLERODERMA ANTIBODY: Scleroderma (Scl-70) (ENA) Antibody, IgG: <0.2 AI (ref 0.0–0.9)

## 2017-01-08 LAB — SEDIMENTATION RATE: SED RATE: 65 mm/h — AB (ref 0–22)

## 2017-01-08 MED ORDER — PREDNISONE 50 MG PO TABS
60.0000 mg | ORAL_TABLET | Freq: Every day | ORAL | Status: DC
  Administered 2017-01-09: 60 mg via ORAL
  Filled 2017-01-08: qty 1

## 2017-01-08 MED ORDER — LEVOFLOXACIN IN D5W 500 MG/100ML IV SOLN
500.0000 mg | INTRAVENOUS | Status: DC
  Administered 2017-01-08: 500 mg via INTRAVENOUS
  Filled 2017-01-08: qty 100

## 2017-01-08 MED ORDER — ENOXAPARIN SODIUM 40 MG/0.4ML ~~LOC~~ SOLN
40.0000 mg | SUBCUTANEOUS | Status: DC
  Filled 2017-01-08: qty 0.4

## 2017-01-08 MED ORDER — FUROSEMIDE 40 MG PO TABS
40.0000 mg | ORAL_TABLET | Freq: Every day | ORAL | Status: DC
  Administered 2017-01-08 – 2017-01-09 (×2): 40 mg via ORAL
  Filled 2017-01-08 (×2): qty 1

## 2017-01-08 MED ORDER — ALPRAZOLAM 0.25 MG PO TABS
0.2500 mg | ORAL_TABLET | Freq: Three times a day (TID) | ORAL | Status: DC | PRN
  Administered 2017-01-08: 0.25 mg via ORAL
  Filled 2017-01-08: qty 1

## 2017-01-08 NOTE — Progress Notes (Signed)
Progress Note    Linda Kelly  JIR:[id number] DOB: 1928-07-03  DOA: 01/03/2017 PCP: Binnie Rail, MD    Brief Narrative:   Chief complaint: Follow-up CHF exacerbation  Medical records reviewed and are as summarized below:  Linda Kelly is an 83 y.o. female with a PMH of pulmonary fibrosis on chronic home oxygen due to exposure at the [facility] site, multivessel CAD (chronic total occlusion of the mid RCA, mid LAD, 99% stenosis of mid-distal circumflex by cath done 07/2016), combined systolic and diastolic CHF (EF 75-10 percent/grade 2 diastolic dysfunction by echo 07/2016), hypertension, and hyperlipidemia who was admitted 01/03/17 with a chief complaint of a 3 day history of shortness of breath.  Assessment/Plan:   Principal Problem:   Acute on chronic respiratory failure with hypoxia [facility]): Multifactorial Multifactorial. Maintained on 2.5 L of nasal cannula oxygen at home with plans to wean to home dose prior to discharge. Diuresed.Chest x-ray done this morning and is compared to the one done 01/07/17. Findings show an infiltrate in the left lower lobe with central pulmonary vascular congestion and interstitial prominence. The film from today likely was not a good quality film with hypoinflation.    Active Problems:   Hyperlipidemia Continue omega-3 fatty acids and statin therapy.    CAD S/P percutaneous coronary angioplasty 1985/non-ST elevation MI Not a candidate for surgery secondary to age/comorbidities, and not a good PCI candidate based on prior cath. Treated with IV heparin for > 48 hours. Continue aspirin/Plavix, statin. Cardiology following.    COPD mixed type (HCC)/HCAP Respiratory virus panel negative. Continue duo nebs, Singulair. Currently on Solu-Medrol 60 mg every 6 hours, wean as tolerated. She had only 2 days of Levaquin, but with concerns for a left lower lobe infiltrate and significant leukocytosis, would place her back on Levaquin.    Acute on chronic  combined systolic and diastolic CHF (congestive heart failure) (HCC) BNP elevated at 960 on admission. Has diuresed with Lasix 40 mg IV twice a day with Lasix held yesterday secondary to acute kidney injury. Not on a beta blocker or ACE inhibitor/ARB secondary to hypotension.    Pulmonary fibrosis (HCC)/Classic UIP Supposed be on Anoro but she had not been taking it.  Continue chronic oxygen, wean as tolerated to home dose of oxygen. Continue steroids per PCCM recommendations, will need prolonged taper over 2-3 weeks. Will need pulmonology follow-up one week post discharge. Prognosis less than 6 months per palliative care evaluation. May be a hospice candidate. Palliative continues to follow for discharge planning.    AKI Creatinine 0.99 on admission, trended up to 1.36 01/07/17. Lasix was held yesterday. Creatinine improved to 1.09 today. Home dose of Lasix resumed.    Steroid-induced hyperglycemia Glucoses mildly elevated on a.m. labs, likely from steroids. Monitor trend and initiate SSI if indicated.    Normocytic anemia Likely anemia of chronic disease.    Leukocytosis Monitor closely. Hopefully just a steroid effect.    Anxiety state Start low-dose Xanax.  Family Communication/Anticipated D/C date and plan/Code Status   DVT prophylaxis: Lovenox ordered. Code Status: Partial code: DNI and no CPR.  Family Communication: No family was at the bedside. Disposition Plan: Lives alone.   Medical Consultants:    PCCM  Cardiology   Palliative Care.   Procedures:    None  Anti-Infectives:    Levaquin 01/03/17---> 01/05/17, Resumed 01/08/17  Subjective:   Patient is quite anxious and endorses shortness of breath. She says she slept well last  night, but has felt stressed since she woke up after working with physical therapy. She complains about frequent needlesticks. Says her appetite is okay.  Objective:    Vitals:   01/07/17 1959 01/08/17 0005 01/08/17 0350 01/08/17 0614   BP: (!) 91/57 93/63  112/86  Pulse: 90 80  79  Resp: 20 18  18   Temp: 97.8 F (36.6 C) 97.9 F (36.6 C)  97.7 F (36.5 C)  TempSrc: Oral Oral  Oral  SpO2: 97% 98%  96%  Weight: 63.5 kg (139 lb 14.4 oz)  64.2 kg (141 lb 8.6 oz) 62.4 kg (137 lb 9.6 oz)  Height: 4\' 10"  (1.473 m)       Intake/Output Summary (Last 24 hours) at 01/08/17 0805 Last data filed at 01/08/17 0615  Gross per 24 hour  Intake              843 ml  Output             1300 ml  Net             -457 ml   Filed Weights   01/07/17 1959 01/08/17 0350 01/08/17 0614  Weight: 63.5 kg (139 lb 14.4 oz) 64.2 kg (141 lb 8.6 oz) 62.4 kg (137 lb 9.6 oz)    Exam: General exam: Chronically ill-appearing female who is anxious. Respiratory system: Bibasilar crackles. Respiratory effort increased. Cardiovascular system: S1 & S2 heard, RRR. No JVD,  rubs, gallops or clicks. No murmurs. Gastrointestinal system: Abdomen is nondistended, soft and nontender. No organomegaly or masses felt. Normal bowel sounds heard. Central nervous system: Alert and oriented x 2. No focal neurological deficits. Extremities: No clubbing,  or cyanosis. No edema. Skin: No rashes, lesions or ulcers. Psychiatry: Judgement and insight appear mildly impaired. Mood & affect anxious.   Data Reviewed:   I have personally reviewed following labs and imaging studies:  Labs: Basic Metabolic Panel:  Recent Labs Lab 01/04/17 0243 01/05/17 0842 01/06/17 0420 01/07/17 0402 01/08/17 0521  NA 136 138 137 136 138  K 3.6 3.4* 3.8 4.0 4.1  CL 99* 99* 96* 95* 99*  CO2 24 29 31 29 29   GLUCOSE 86 101* 160* 159* 157*  BUN 21* 22* 32* 46* 41*  CREATININE 0.96 0.88 1.09* 1.36* 1.09*  CALCIUM 8.1* 8.5* 9.5 9.2 8.8*   GFR Estimated Creatinine Clearance: 29 mL/min (by C-G formula based on SCr of 1.09 mg/dL (H)). Liver Function Tests:  Recent Labs Lab 01/03/17 1117 01/08/17 0521  AST 23 16  ALT 9* 9*  ALKPHOS 63 60  BILITOT 1.1 0.7  PROT 7.2 6.3*    ALBUMIN 3.4* 3.1*    CBC:  Recent Labs Lab 01/03/17 1117 01/04/17 0243 01/05/17 0842 01/06/17 0420 01/07/17 0402 01/08/17 0521  WBC 16.4* 15.1* 12.2* 8.5 20.4* 20.4*  NEUTROABS 13.6*  --   --   --   --   --   HGB 9.9* 9.2* 9.4* 10.0* 9.7* 9.4*  HCT 31.5* 29.3* 30.5* 31.8* 30.5* 29.1*  MCV 96.6 96.1 95.6 94.9 93.6 94.5  PLT 211 212 225 263 293 322   Cardiac Enzymes:  Recent Labs Lab 01/03/17 1514 01/03/17 1932 01/04/17 0243  TROPONINI 4.04* 3.19* 2.44*   CBG:  Recent Labs Lab 01/07/17 1209 01/07/17 1732  GLUCAP 143* 122*    Microbiology Recent Results (from the past 240 hour(s))  MRSA PCR Screening     Status: None   Collection Time: 01/03/17  5:24 PM  Result Value Ref Range  Status   MRSA by PCR NEGATIVE NEGATIVE Final    Comment:        The GeneXpert MRSA Assay (FDA approved for NASAL specimens only), is one component of a comprehensive MRSA colonization surveillance program. It is not intended to diagnose MRSA infection nor to guide or monitor treatment for MRSA infections.   Respiratory Panel by PCR     Status: None   Collection Time: 01/03/17  9:00 PM  Result Value Ref Range Status   Adenovirus NOT DETECTED NOT DETECTED Final   Coronavirus 229E NOT DETECTED NOT DETECTED Final   Coronavirus HKU1 NOT DETECTED NOT DETECTED Final   Coronavirus NL63 NOT DETECTED NOT DETECTED Final   Coronavirus OC43 NOT DETECTED NOT DETECTED Final   Metapneumovirus NOT DETECTED NOT DETECTED Final   Rhinovirus / Enterovirus NOT DETECTED NOT DETECTED Final   Influenza A NOT DETECTED NOT DETECTED Final   Influenza B NOT DETECTED NOT DETECTED Final   Parainfluenza Virus 1 NOT DETECTED NOT DETECTED Final   Parainfluenza Virus 2 NOT DETECTED NOT DETECTED Final   Parainfluenza Virus 3 NOT DETECTED NOT DETECTED Final   Parainfluenza Virus 4 NOT DETECTED NOT DETECTED Final   Respiratory Syncytial Virus NOT DETECTED NOT DETECTED Final   Bordetella pertussis NOT DETECTED  NOT DETECTED Final   Chlamydophila pneumoniae NOT DETECTED NOT DETECTED Final   Mycoplasma pneumoniae NOT DETECTED NOT DETECTED Final    Radiology: Dg Chest 2 View  Result Date: 01/07/2017 CLINICAL DATA:  Shortness of breath.  Pulmonary fibrosis. EXAM: CHEST  2 VIEW COMPARISON:  01/04/2017. FINDINGS: Normal sized heart with an interval decrease in size. Decreased prominence of the pulmonary vasculature and interstitial markings. Residual interstitial prominence throughout both lungs. Diffuse osteopenia and bilateral shoulder degenerative changes. No pleural fluid. IMPRESSION: Resolved changes of congestive heart failure with residual chronic interstitial lung disease. Electronically Signed   By: Claudie Revering M.D.   On: 01/07/2017 08:38   Dg Chest Port 1 View  Result Date: 01/08/2017 CLINICAL DATA:  Shortness of breath EXAM: PORTABLE CHEST 1 VIEW COMPARISON:  PA and lateral chest x-ray of October 09, 2017 FINDINGS: Today's study is obtained in a somewhat lordotic projection. The lungs are hypoinflated. There is retrocardiac density on the left with partial obscuration of the hemidiaphragm. The cardiac silhouette is enlarged. The interstitial markings of both lungs are coarse. There is calcification in the wall of the aortic arch. The bony thorax exhibits no acute abnormalities. IMPRESSION: Limited study due to patient positioning. Mild hypoinflation. Left lower lobe atelectasis or pneumonia. Enlargement of the cardiac silhouette with mild central pulmonary vascular congestion and bilateral interstitial prominence may reflect low-grade CHF. Thoracic aortic atherosclerosis. Electronically Signed   By: David  Martinique M.D.   On: 01/08/2017 07:42    Medications:   . aspirin EC  81 mg Oral QHS  . atorvastatin  40 mg Oral q1800  . clopidogrel  75 mg Oral Daily  . famotidine  10 mg Oral Daily  . furosemide  40 mg Oral Daily  . methylPREDNISolone (SOLU-MEDROL) injection  60 mg Intravenous Q6H  .  montelukast  10 mg Oral QHS  . omega-3 acid ethyl esters  1 g Oral BID  . potassium chloride  5.0667 mEq Oral Daily  . sertraline  50 mg Oral Daily   Continuous Infusions:  Medical decision making is of high complexity and this patient is at high risk of deterioration, therefore this is a level 3 visit.  (> 4 problem points, >4  data points, high risk)   LOS: 5 days   Latish Toutant  [facility] Pager [phone]. If unable to reach me by pager, please call my cell phone at [phone].  *Please refer to [email or web address], password [email or web address] to get updated schedule on who will round on this patient, as hospitalists switch teams weekly. If 7PM-7AM, please contact night-coverage at [email or web address], password [email or web address] for any overnight needs.  01/08/2017, 8:05 AM

## 2017-01-08 NOTE — Progress Notes (Signed)
Pharmacy Antibiotic Note Linda Kelly is a 83 y.o. female admitted on 01/03/2017 with pneumonia.  Pharmacy has been consulted for Levaquin dosing. Current CrCl ~ 30 ml/min   Plan: Begin Levaquin 500 mg IV every 24 hours   Height: 4\' 10"  (147.3 cm) Weight: 137 lb 9.6 oz (62.4 kg) IBW/kg (Calculated) : 40.9  Temp (24hrs), Avg:97.7 F (36.5 C), Min:97.3 F (36.3 C), Max:97.9 F (36.6 C)   Recent Labs Lab 01/04/17 0243 01/05/17 0842 01/06/17 0420 01/07/17 0402 01/08/17 0521  WBC 15.1* 12.2* 8.5 20.4* 20.4*  CREATININE 0.96 0.88 1.09* 1.36* 1.09*    Estimated Creatinine Clearance: 29 mL/min (by C-G formula based on SCr of 1.09 mg/dL (H)).    No Known Allergies  Antimicrobials this admission: 3/8 Levaquin >> 3/10, 3/13 >>   Microbiology results: 3/8 MRSA PCR: negative   Thank you for allowing pharmacy to be a part of this patient's care.  Duayne Cal 01/08/2017 3:30 PM

## 2017-01-08 NOTE — Progress Notes (Signed)
Name: Linda Kelly MRN: [id number] DOB: 10-Sep-1928    ADMISSION DATE:  01/03/2017 CONSULTATION DATE:  01/04/2017   REFERRING MD :  Dr. Reesa Chew   CHIEF COMPLAINT:  Dyspnea   BRIEF PATIENT DESCRIPTION:  83 yo female with progressive dyspnea, hypoxia 2nd to IPF with acute pneumonitis, combined CHF. Followed by Dr. Annamaria Boots outpatient.   SIGNIFICANT EVENTS  3/8 > Admit 3/10 > Steroids began   STUDIES:  PFT 02/19/13 > FEV1 1.40 (119%), FEV1% 86, TLC 3.31 (94%), DLCO 43% Echo 08/10/16 > EF 40 to 45%, grade 2 DD, mild/mod MR HR CT chest 11/28/16 > extensive subpleural reticulation and GGO, traction BTX, mild honeycombing  ESR 01/04/17 >> 84  ANTIBIOTICS: Levaquin 3/09 > 3/11  CULTURES Respiratory viral panel 3/08 >> negative  SUBJECTIVE:  Denies dyspnea. Working with PT  VITAL SIGNS: Temp:  [97.3 F (36.3 C)-97.9 F (36.6 C)] 97.7 F (36.5 C) (03/13 0614) Pulse Rate:  [74-94] 74 (03/13 1151) Resp:  [18-24] 20 (03/13 1151) BP: (91-146)/(57-112) 101/72 (03/13 1151) SpO2:  [96 %-98 %] 98 % (03/13 1151) Weight:  [62.4 kg (137 lb 9.6 oz)-64.2 kg (141 lb 8.6 oz)] 62.4 kg (137 lb 9.6 oz) (03/13 6759)  PHYSICAL EXAMINATION: General:  Elderly female, no distress  Neuro:  Alert, oriented, fully intact  HEENT:  Normocephalic  Cardiovascular:  RRR, no MRG, NI S1/S2 Lungs:  Coarse breath sounds, no wheeze, unlabored  Abdomen:  Non-tender, active bowel sounds  Musculoskeletal:  No acute  Skin:  Warm, dry, intact    Recent Labs Lab 01/06/17 0420 01/07/17 0402 01/08/17 0521  NA 137 136 138  K 3.8 4.0 4.1  CL 96* 95* 99*  CO2 _0 BUN 32* 46* 41*  CREATININE 1.09* 1.36* 1.09*  GLUCOSE 160* 159* 157*    Recent Labs Lab 01/06/17 0420 01/07/17 0402 01/08/17 0521  HGB 10.0* 9.7* 9.4*  HCT 31.8* 30.5* 29.1*  WBC 8.5 20.4* 20.4*  PLT 263 293 322   Dg Chest 2 View  Result Date: 01/07/2017 CLINICAL DATA:  Shortness of breath.  Pulmonary fibrosis. EXAM: CHEST  2 VIEW  COMPARISON:  01/04/2017. FINDINGS: Normal sized heart with an interval decrease in size. Decreased prominence of the pulmonary vasculature and interstitial markings. Residual interstitial prominence throughout both lungs. Diffuse osteopenia and bilateral shoulder degenerative changes. No pleural fluid. IMPRESSION: Resolved changes of congestive heart failure with residual chronic interstitial lung disease. Electronically Signed   By: Claudie Revering M.D.   On: 01/07/2017 08:38   Dg Chest Port 1 View  Result Date: 01/08/2017 CLINICAL DATA:  Shortness of breath EXAM: PORTABLE CHEST 1 VIEW COMPARISON:  PA and lateral chest x-ray of October 09, 2017 FINDINGS: Today's study is obtained in a somewhat lordotic projection. The lungs are hypoinflated. There is retrocardiac density on the left with partial obscuration of the hemidiaphragm. The cardiac silhouette is enlarged. The interstitial markings of both lungs are coarse. There is calcification in the wall of the aortic arch. The bony thorax exhibits no acute abnormalities. IMPRESSION: Limited study due to patient positioning. Mild hypoinflation. Left lower lobe atelectasis or pneumonia. Enlargement of the cardiac silhouette with mild central pulmonary vascular congestion and bilateral interstitial prominence may reflect low-grade CHF. Thoracic aortic atherosclerosis. Electronically Signed   By: David  Martinique M.D.   On: 01/08/2017 07:42    ASSESSMENT / PLAN:  Acute on Chronic Hypoxic Respiratory Failure secondary to IPF with acute pneumonitis +/- bronchitis  -On 2.5L baseline Plan -Maintain Oxygen  Saturation >90 -Plan 3 week steroid taper > Start 60 mg prednisone 3/13, continue for 5 days. Then 50 mg for 4 days, 40 mg for 4 days, 30 mg for 4 days, 20 mg 3 days, 10 mg 2 days  -Continue Scheduled Duoneb  -May need increase oxygen requirements at discharge  -Will make outpatient follow up appointment    , AG-ACNP [facility]  Pgr: [phone]  PCCM Pgr: [phone]  

## 2017-01-08 NOTE — Progress Notes (Signed)
Daily Progress Note   Patient Name: Linda Kelly       Date: 01/08/2017 DOB: [birth year]  Age: 83 y.o. MRN#: [id number] Attending Physician: Venetia Maxon Rama, MD Primary Care Physician: Binnie Rail, MD Admit Date: 01/03/2017  Reason for Consultation/Follow-up: Establishing goals of care and Hospice Evaluation  Subjective: Linda Kelly is feeling good and looking chipper today.  She is still on 3.5 - 4L of oxygen and she is unable to transfer without assistance, but she is feeling well.  Today I have talked with her son Linda Kelly, her son Linda Kelly, her daughter Linda Kelly and the patient herself - each individually about the patient's medical situation and whether to accept regular [facility] services vs hospice at discharge.  Linda Kelly is very focused on her hospital bill and wants me to take down her [facility] information, but with each of the three children I am able to explain the chronic degradation of both her pulmonary fibrosis and her severe coronary artery disease.  I gave an estimated prognosis (when asked of 6 mos - 1 year).    The patient and her primary hands on care taker, Linda Kelly, have opted not to accept hospice services at this time.  But Linda Kelly will have some one stay with her mother 24 hours a day.  Linda Kelly commented - when she gets sick again we will be ready for hospice services.  I advised her that she was able to obtain them thru her mother's primary care physician if Linda Kelly was not in the hospital.  Assessment: 83 yo female with advanced pulmonary disease and advanced coronary artery disease.  Oxygen requirements are improving.  Now she is on 3.5L.  I anticipate she will not be in the hospital much longer.   Patient Profile/HPI: 83 y.o. female  with past medical history of pulmonary  fibrosis/ UIP, mixed heart failure, and severe CAD who was admitted on 01/03/2017 with acute on chronic respiratory failure secondary to heart failure in the setting of pulm fibrosis.  Linda Kelly was found to have a troponin of 3.14.  Cardiology was consulted and diagnosed her with NSTEMI.  She is not a candidate for invasive procedures and is being treated with medical management.  Prior to admission she was on 2.5L of oxygen continuously at home.  She was  on 15L of high flow but has been able to slowly wean to 3.5 L  Length of Stay: 5  Current Medications: Scheduled Meds:  . aspirin EC  81 mg Oral QHS  . atorvastatin  40 mg Oral q1800  . clopidogrel  75 mg Oral Daily  . enoxaparin (LOVENOX) injection  40 mg Subcutaneous Q24H  . famotidine  10 mg Oral Daily  . furosemide  40 mg Oral Daily  . methylPREDNISolone (SOLU-MEDROL) injection  60 mg Intravenous Q6H  . montelukast  10 mg Oral QHS  . omega-3 acid ethyl esters  1 g Oral BID  . potassium chloride  5.0667 mEq Oral Daily  . sertraline  50 mg Oral Daily    Continuous Infusions:   PRN Meds: acetaminophen, ALPRAZolam, ipratropium-albuterol, ondansetron (ZOFRAN) IV, zolpidem  Physical Exam        Well developed petite female.  Looks improved.  Sitting up in bed chatting with a friend. On 3.5 no resp distress.  Able to speak without shortness of breath. A&O, coherent, pleasant and cooperative  Vital Signs: BP 101/72 (BP Location: Right Arm)   Pulse 74   Temp 97.7 F (36.5 C) (Oral)   Resp 20   Ht 4\' 10"  (1.473 m)   Wt 62.4 kg (137 lb 9.6 oz)   SpO2 98%   BMI 28.76 kg/m  SpO2: SpO2: 98 % O2 Device: O2 Device: Nasal Cannula O2 Flow Rate: O2 Flow Rate (L/min): 3.5 L/min  Intake/output summary:   Intake/Output Summary (Last 24 hours) at 01/08/17 1403 Last data filed at 01/08/17 5284  Gross per 24 hour  Intake              480 ml  Output             1220 ml  Net             -740 ml   LBM: Last BM Date: 01/05/17 Baseline  Weight: Weight: 69.9 kg (154 lb 1.6 oz) Most recent weight: Weight: 62.4 kg (137 lb 9.6 oz)       Palliative Assessment/Data:    Flowsheet Rows   Flowsheet Row Most Recent Value  Intake Tab  Referral Department  Hospitalist  Unit at Time of Referral  Intermediate Care Unit  Palliative Care Primary Diagnosis  Cardiac  Date Notified  01/05/17  Palliative Care Type  New Palliative care  Reason for referral  Clarify Goals of Care  Date of Admission  01/03/17  Date first seen by Palliative Care  01/05/17  # of days Palliative referral response time  0 Day(s)  # of days IP prior to Palliative referral  2  Clinical Assessment  Palliative Performance Scale Score  30%  Dyspnea Max Last 24 Hours  6  Dyspnea Min Last 24 hours  3  Psychosocial & Spiritual Assessment  Palliative Care Outcomes  Patient/Family meeting held?  Yes  Who was at the meeting?  patient  Palliative Care Outcomes  Clarified goals of care, Changed CPR status  Patient/Family wishes: Interventions discontinued/not started   Mechanical Ventilation, Vasopressors      Patient Active Problem List   Diagnosis Date Noted  . Steroid-induced hyperglycemia 01/08/2017  . Normocytic anemia 01/08/2017  . Leukocytosis 01/08/2017  . Encounter for hospice care discussion   . Palliative care encounter   . Goals of care, counseling/discussion   . Acute on chronic respiratory failure with hypoxia ([place]) 01/03/2017  . Demand ischemia ([place])   . Elevated troponin   .  Chronic respiratory failure ([place]) 11/29/2016  . Episodic lightheadedness 08/28/2016  . Sleep difficulties 08/28/2016  . Acute on chronic combined systolic and diastolic CHF (congestive heart failure) ([place])   . Pulmonary fibrosis ([place])   . Non-ST elevation (NSTEMI) myocardial infarction ([place]) 08/08/2016  . Lumbar radiculopathy 08/01/2016  . Left-sided low back pain without sciatica 03/28/2016  . Cephalalgia 03/10/2016  . Overactive bladder 02/21/2016  . Poor  balance 02/21/2016  . Chronic lower back pain 02/21/2016  . Carotid stenosis-s/p CEA 2015 03/03/2014  . Aortic atherosclerosis ([place]) 02/04/2014  . Change in bowel habits 05/22/2011  . URINARY INCONTINENCE 12/22/2010  . ABDOMINAL BRUIT 08/10/2010  . FASTING HYPERGLYCEMIA 11/15/2009  . Depression 08/01/2009  . GERD 08/01/2009  . IRRITABLE BOWEL SYNDROME 12/27/2008  . TINNITUS, CHRONIC 11/05/2008  . HEARING LOSS, HIGH FREQUENCY 11/05/2008  . HIATAL HERNIA 08/11/2008  . OTHER NONTHROMBOCYTOPENIC PURPURAS 06/08/2008  . ORTHOSTATIC HYPOTENSION 06/08/2008  . Lung nodule 11/20/2007  . OBESITY, MILD 11/19/2007  . Hyperlipidemia 10/09/2007  . Tobacco abuse, in remission 10/09/2007  . Essential hypertension 10/09/2007  . CAD S/P percutaneous coronary angioplasty 1985 10/09/2007  . PVD (peripheral vascular disease) ([place]) 10/09/2007  . COPD mixed type ([place]) 10/09/2007  . VERTIGO 03/31/2007  . Headache 03/31/2007    Palliative Care Plan    Recommendations/Plan:  D/C to home when medically appropriate with whatever home health services the patient and daughter want to accept.  3 in 1 DME ordered  family intends to provide 24 hour care.  Goals of Care and Additional Recommendations:  Limitations on Scope of Treatment: Full Scope Treatment  Code Status:  DNR  Prognosis:   < 6 months - due to severe coronary artery disease and severe pulmonary fibrosis / UIP   Discharge Planning:  Home with Hospice vs home health  Care plan was discussed with patient and son Kriste Basque on [phone] [facility]), son Linda Kelly, and dtr Linda Kelly as well as case manager, [name]  Thank you for allowing the Palliative Medicine Team to assist in the care of this patient.  Total time spent:  45 min.     Greater than 50%  of this time was spent counseling and coordinating care related to the above assessment and plan.  Imogene Burn, PA-C Palliative Medicine  Please contact Palliative MedicineTeam phone  at [phone] for questions and concerns between 7 am - 7 pm.   Please see AMION for individual provider pager numbers.

## 2017-01-08 NOTE — Care Management Note (Addendum)
Case Management Note  Patient Details  Name: Linda Kelly MRN: [id number] Date of Birth: 06-23-1928  Subjective/Objective:      Admitted with Acute on Chronic Resp Failure             Action/Plan: CM talked to patient about possible HHC services at discharge with her daughter present, patient does not want any HHC at this time; her daughter stated, last time she was discharged from the hospital she did her physical therapy; (the physical therapist gave the daughter written information of the type of exercises she needed to perform at home); she has home oxygen with [place]; CM will continue to follow for DCP  01/09/2017- Talked to patient again about [facility] services, no she is in agreement and chose [place]; Butch Penny with [facility] called for arrangements. HHRN/ PT;  Expected Discharge Date:     Possibly 01/09/2017             Expected Discharge Plan:  [place]  In-House Referral:   [facility]  Discharge planning Services  CM Consult  Choice offered to:  Patient  HH Arranged:  Patient Refused    Status of Service:  In process, will continue to follow  Sherrilyn Rist [phone] 01/08/2017, 2:04 PM

## 2017-01-08 NOTE — Consult Note (Signed)
   [facility] CM Inpatient Consult   01/08/2017  Linda Kelly 02-Sep-1928 [id number]   Patient evaluated for [place] [facility] as a [place] beneficiary.  Chart review reveals the patient is an 83 y.o. female  with past medical history of pulmonary fibrosis/ UIP, mixed heart failure, and severe CAD who was admitted on 01/03/2017 with acute on chronic respiratory failure secondary to heart failure in the setting of pulm fibrosis.  [name] was found to have a troponin of 3.14.  Cardiology was consulted and diagnosed her with NSTEMI per Palliative Care PA-C note. Patient lives alone but has 4 of the 6 of her children living in the area.   01/09/17 12:26 - Patient endorses her Primary care provider Dr. Billey Gosling, uses [facility] [address] for pharmacy. She has declined palliative care/hospice at this time and she is limiting her home health services she states, "because it will cost a lot"  Butch Penny from [place] explaining the possible so-pays.  Patient consented to 1 PT session at home.  Explained [place] Management's role for community follow up and support.  Consent form signed.  Patient states she needed a 3-N-1 and notified the inpatient RNCM, [name].  Patient will be followed for [place] Management and explained [place] Management does not replace or interfere with services already arranged.  Patient and daughter verbalized understand and was given a folder with [facility] Management information.  For questions, please contact:  Natividad Brood, RN BSN [facility] Liaison  [phone] business mobile phone Toll free office [phone]

## 2017-01-08 NOTE — Progress Notes (Signed)
Progress Note  Patient Name: Linda Kelly Date of Encounter: 01/08/2017  Primary Cardiologist: Dr. Stanford Breed  Subjective   Says she slept very well overnight (received Ambien). Denies any chest discomfort or palpitations. Breathing improving, still not at baseline.   Inpatient Medications    Scheduled Meds: . aspirin EC  81 mg Oral QHS  . atorvastatin  40 mg Oral q1800  . clopidogrel  75 mg Oral Daily  . famotidine  10 mg Oral Daily  . methylPREDNISolone (SOLU-MEDROL) injection  60 mg Intravenous Q6H  . montelukast  10 mg Oral QHS  . omega-3 acid ethyl esters  1 g Oral BID  . potassium chloride  5.0667 mEq Oral Daily  . sertraline  50 mg Oral Daily   Continuous Infusions:  PRN Meds: acetaminophen, ipratropium-albuterol, ondansetron (ZOFRAN) IV, zolpidem   Vital Signs    Vitals:   01/07/17 1959 01/08/17 0005 01/08/17 0350 01/08/17 0614  BP: (!) 91/57 93/63  112/86  Pulse: 90 80  79  Resp: 20 18  18   Temp: 97.8 F (36.6 C) 97.9 F (36.6 C)  97.7 F (36.5 C)  TempSrc: Oral Oral  Oral  SpO2: 97% 98%  96%  Weight: 139 lb 14.4 oz (63.5 kg)  141 lb 8.6 oz (64.2 kg) 137 lb 9.6 oz (62.4 kg)  Height: 4\' 10"  (1.473 m)       Intake/Output Summary (Last 24 hours) at 01/08/17 0747 Last data filed at 01/08/17 0615  Gross per 24 hour  Intake              843 ml  Output             1300 ml  Net             -457 ml   Filed Weights   01/07/17 1959 01/08/17 0350 01/08/17 0614  Weight: 139 lb 14.4 oz (63.5 kg) 141 lb 8.6 oz (64.2 kg) 137 lb 9.6 oz (62.4 kg)    Telemetry    NSR, HR in 70's. Artifact overnight on telemetry.  - Personally Reviewed  ECG    No new tracings.   Physical Exam   General: Well developed, elderly Caucasian female appearing in no acute distress. Head: Normocephalic, atraumatic.  Neck: Supple without bruits, JVD not elevated. Lungs:  Resp regular and unlabored, mild rales at bases bilaterally. Heart: RRR, S1, S2, no S3, S4, or murmur; no  rub. Abdomen: Soft, non-tender, non-distended with normoactive bowel sounds. No hepatomegaly. No rebound/guarding. No obvious abdominal masses. Extremities: No clubbing, cyanosis, or lower extremity edema. Distal pedal pulses are 2+ bilaterally. Neuro: Alert and oriented X 3. Moves all extremities spontaneously. Psych: Normal affect.  Labs    Chemistry Recent Labs Lab 01/03/17 1117  01/06/17 0420 01/07/17 0402 01/08/17 0521  NA 138  < > 137 136 138  K 4.2  < > 3.8 4.0 4.1  CL 103  < > 96* 95* 99*  CO2 26  < > 31 29 29   GLUCOSE 116*  < > 160* 159* 157*  BUN 22*  < > 32* 46* 41*  CREATININE 0.99  < > 1.09* 1.36* 1.09*  CALCIUM 8.6*  < > 9.5 9.2 8.8*  PROT 7.2  --   --   --  6.3*  ALBUMIN 3.4*  --   --   --  3.1*  AST 23  --   --   --  16  ALT 9*  --   --   --  9*  ALKPHOS 63  --   --   --  60  BILITOT 1.1  --   --   --  0.7  GFRNONAA 50*  < > 45* 34* 45*  GFRAA 58*  < > 52* 40* 52*  ANIONGAP 9  < > 10 12 10   < > = values in this interval not displayed.   Hematology  Recent Labs Lab 01/06/17 0420 01/07/17 0402 01/08/17 0521  WBC 8.5 20.4* 20.4*  RBC 3.35* 3.26* 3.08*  HGB 10.0* 9.7* 9.4*  HCT 31.8* 30.5* 29.1*  MCV 94.9 93.6 94.5  MCH 29.9 29.8 30.5  MCHC 31.4 31.8 32.3  RDW 14.8 14.7 15.0  PLT 263 293 322    Cardiac Enzymes  Recent Labs Lab 01/03/17 1514 01/03/17 1932 01/04/17 0243  TROPONINI 4.04* 3.19* 2.44*     Recent Labs Lab 01/03/17 1128  TROPIPOC 2.15*     BNP  Recent Labs Lab 01/03/17 1117  BNP 960.4*     DDimer No results for input(s): DDIMER in the last 168 hours.   Radiology    Dg Chest 2 View  Result Date: 01/07/2017 CLINICAL DATA:  Shortness of breath.  Pulmonary fibrosis. EXAM: CHEST  2 VIEW COMPARISON:  01/04/2017. FINDINGS: Normal sized heart with an interval decrease in size. Decreased prominence of the pulmonary vasculature and interstitial markings. Residual interstitial prominence throughout both lungs. Diffuse  osteopenia and bilateral shoulder degenerative changes. No pleural fluid. IMPRESSION: Resolved changes of congestive heart failure with residual chronic interstitial lung disease. Electronically Signed   By: Claudie Revering M.D.   On: 01/07/2017 08:38    Cardiac Studies   Echocardiogram: 08/10/2016 Study Conclusions  - Left ventricle: The cavity size was normal. Wall thickness was   normal. Systolic function was mildly to moderately reduced. The   estimated ejection fraction was in the range of 40% to 45%.   Moderate hypokinesis of the basal-midanterolateral and   inferolateral myocardium; consistent with ischemia in the   distribution of the left circumflex coronary artery. Features are   consistent with a pseudonormal left ventricular filling pattern,   with concomitant abnormal relaxation and increased filling   pressure (grade 2 diastolic dysfunction). - Mitral valve: Moderately calcified annulus. There was mild to   moderate regurgitation directed centrally. - Left atrium: The atrium was mildly dilated  Cardiac Catheterization: 03/47/4259  Complicated procedure that demonstrated high-grade obstruction in the right iliac artery preventing the procedure from being completed and requiring crossover to the left femoral artery.  Inability to advance the pulmonary artery catheter into the pulmonary artery or capillary wedge position. RV systolic pressure is mildly to moderately elevated.  Severe multivessel coronary disease with chronic total occlusion of the mid RCA collateralized by right to right and left-to-right sources.  Total occlusion of the mid LAD beyond a large first diagonal. Left left collaterals are noted.  99% stenosis in the mid to distal native circumflex beyond a very tortuous segment. PCI was not attempted. Distal territory is relatively small.  Inferior wall severe hypokinesis involving the basal to mid segment. Anterior and apical mild hypokinesis is noted. Ejection  fraction is estimated to be 35-45%. Presentation was that of acute on chronic combined systolic and diastolic heart failure.  RECOMMENDATIONS:   Medical therapy with optimization of heart failure regimen.  Antiplatelet therapy.  Given the patient's age, I do not believe surgical revascularization is warranted/prudent.  Only PCI target would be the mid circumflex, which I do not believe would  change her clinical status (not having ischemic pain or evidence of ongoing infarction). There would be relatively high risk of inability to deliver stent and/or primary failure due to tortuosity.  Patient Profile     83 y.o. female w/ PMH of CAD (severe, multivessel with CTO of RCA, CTO of mild-LAD beyond 1st Diag, and 99% mid-distal LCx disease by cath in 07/2016 - not felt to be a surgical candidate), pulmonary fibrosis, carotid artery disease, HLD, and PVD admitted with worsening dyspnea. Cardiology consulted for CHF and elevated troponin.   Assessment & Plan    1. Acute on Chronic Combined Systolic and Diastolic CHF/ Ischemic Cardiomyopathy - BNP elevated at 960 on admission. Echo in 07/2016 showed a reduced EF of 40-45% with Grade 2 DD.  - diuresed with IV Lasix 40mg  BID with a recorded net output of -3.3L, yet weights have been variable at 137 lbs to 141 lbs this AM (was 146 lbs on admission).  IV Lasix discontinued in the setting of her improved respiratory status and AKI. With improving creatinine, will resume PTA PO Lasix 40mg  daily. - recommend resuming low-dose cardioselective BB prior to discharge.   2. Type 2 NSTEMI/ CAD - she has known severe multivessel with CTO of RCA, CTO of mild-LAD beyond 1st Diag, and 99% mid-distal LCx disease by cath in 07/2016. Was not felt to be a surgical candidate secondary to her age and co-morbidities.  - peak troponin of 4.04, trending down to 2.44 on 3/9. Medically treated with Heparin for > 48 hours. Not a surgical candidate or good PCI candidate based  on cath in 07/2016. Agree with Palliative Care consultation.  - continue ASA, Plavix, and statin.   3. Pulmonary Fibrosis - initially on 15L, now at 98% on 3.5L [place]. Was using 2.5L [place] prior to admission. - Pulmonology following.   4. HTN - BP at 91/57 - 134/112 in the past 24 hours. - PTA anti-hypertensive medications currently held with episodes of hypotension. Consider resuming PTA Cardioselective BB.   5. AKI - creatinine 0.99 on admission, trending up to 1.36 on 3/12, improved to 1.09 this AM.   6. Leukocytosis - WBC trending up to 20.4. - likely secondary to steroid use as she is receiving Solu-Medrol.   Arna Medici , PA-C 7:47 AM 01/08/2017 Pager: [phone]  I have seen and examined the patient along with Erma Heritage , PA-C.  I have reviewed the chart, notes and new data.  I agree with PA's note.  Key new complaints: breathing much better, slept flat in bed. No angina. Easily frustrated and anxious (for example due to infiltrated IV this AM). Key examination changes: No overt hypervolemia Key new findings / data: creat markedly improved.  PLAN: Reviewed again sodium restriction, daily weights, signs and symptoms of CHF. BP borderline low. Seems to have a very low margin for decompensation - would adjust diuretic dose for weight gain >3 lb from "dry weight".  From cardiac point of view may be ready for DC tomorrow.  Sanda Klein, MD, [place] [phone] 01/08/2017, 10:53 AM

## 2017-01-09 DIAGNOSIS — I509 Heart failure, unspecified: Secondary | ICD-10-CM

## 2017-01-09 DIAGNOSIS — F411 Generalized anxiety disorder: Secondary | ICD-10-CM

## 2017-01-09 LAB — BASIC METABOLIC PANEL
ANION GAP: 6 (ref 5–15)
BUN: 46 mg/dL — ABNORMAL HIGH (ref 6–20)
CALCIUM: 8.7 mg/dL — AB (ref 8.9–10.3)
CHLORIDE: 96 mmol/L — AB (ref 101–111)
CO2: 34 mmol/L — AB (ref 22–32)
Creatinine, Ser: 1.04 mg/dL — ABNORMAL HIGH (ref 0.44–1.00)
GFR calc Af Amer: 55 mL/min — ABNORMAL LOW (ref >60)
GFR calc non Af Amer: 47 mL/min — ABNORMAL LOW (ref >60)
GLUCOSE: 186 mg/dL — AB (ref 65–99)
POTASSIUM: 4.1 mmol/L (ref 3.5–5.1)
Sodium: 136 mmol/L (ref 135–145)

## 2017-01-09 LAB — CBC
HEMATOCRIT: 30.4 % — AB (ref 36.0–46.0)
Hemoglobin: 9.6 g/dL — ABNORMAL LOW (ref 12.0–15.0)
MCH: 29.6 pg (ref 26.0–34.0)
MCHC: 31.6 g/dL (ref 30.0–36.0)
MCV: 93.8 fL (ref 78.0–100.0)
PLATELETS: 335 10*3/uL (ref 150–400)
RBC: 3.24 MIL/uL — AB (ref 3.87–5.11)
RDW: 14.5 % (ref 11.5–15.5)
WBC: 17.1 10*3/uL — AB (ref 4.0–10.5)

## 2017-01-09 LAB — SJOGRENS SYNDROME-B EXTRACTABLE NUCLEAR ANTIBODY: SSB (La) (ENA) Antibody, IgG: <0.2 AI (ref 0.0–0.9)

## 2017-01-09 LAB — SJOGRENS SYNDROME-A EXTRACTABLE NUCLEAR ANTIBODY

## 2017-01-09 MED ORDER — PREDNISONE 10 MG PO TABS: ORAL_TABLET | ORAL | 0 refills | Status: DC

## 2017-01-09 MED ORDER — ALPRAZOLAM 0.25 MG PO TABS: 0.2500 mg | ORAL_TABLET | Freq: Three times a day (TID) | ORAL | 0 refills | Status: DC | PRN

## 2017-01-09 MED ORDER — LEVOFLOXACIN 250 MG PO TABS: 500.0000 mg | ORAL_TABLET | Freq: Every day | ORAL | 0 refills | Status: DC

## 2017-01-09 MED ORDER — LEVOFLOXACIN 500 MG PO TABS: 500.0000 mg | ORAL_TABLET | Freq: Every day | ORAL | 0 refills | Status: DC

## 2017-01-09 MED ORDER — IPRATROPIUM-ALBUTEROL 0.5-2.5 (3) MG/3ML IN SOLN: 3.0000 mL | RESPIRATORY_TRACT | 4 refills | Status: DC | PRN

## 2017-01-09 MED ORDER — LEVOFLOXACIN 500 MG PO TABS
250.0000 mg | ORAL_TABLET | Freq: Every day | ORAL | Status: DC
  Administered 2017-01-09: 250 mg via ORAL
  Filled 2017-01-09: qty 1

## 2017-01-09 MED ORDER — LEVOFLOXACIN 250 MG PO TABS: 250.0000 mg | ORAL_TABLET | Freq: Every day | ORAL | 0 refills | Status: DC

## 2017-01-09 NOTE — Discharge Summary (Signed)
Physician Discharge Summary   Patient ID: Linda Kelly MRN: [id number] DOB/AGE: 1928-07-07 83 y.o.  Admit date: 01/03/2017 Discharge date: 01/09/2017  Primary Care Physician:  Binnie Rail, MD  Discharge Diagnoses:    . Acute on chronic respiratory failure with hypoxia ([place]) . Pulmonary fibrosis ([place]) . Acute on chronic combined systolic and diastolic CHF (congestive heart failure) ([place]) . Non-ST elevation (NSTEMI) myocardial infarction ([place]) . HCAP (healthcare-associated pneumonia) . Hyperlipidemia . Essential hypertension . COPD mixed type ([place]) . Steroid-induced hyperglycemia . Normocytic anemia . Anxiety state    Consults:   Cardiology Pulmonology Palliative medicine  Recommendations for Outpatient Follow-up:   1. Please repeat CBC/BMET at next visit 2. Home health PT, OT, home health aide, nurse arranged by case management   DIET: heart healthy diet, low sodium, weight monitoring    Allergies:  No Known Allergies   DISCHARGE MEDICATIONS: Current Discharge Medication List    START taking these medications   Details  ALPRAZolam (XANAX) 0.25 MG tablet Take 1 tablet (0.25 mg total) by mouth 3 (three) times daily as needed for anxiety. Qty: 30 tablet, Refills: 0    ipratropium-albuterol (DUONEB) 0.5-2.5 (3) MG/3ML SOLN Take 3 mLs by nebulization every 4 (four) hours as needed. Qty: 360 mL, Refills: 4    levofloxacin (LEVAQUIN) 250 MG tablet Take 1 tablet (250 mg total) by mouth daily. X 5 days Qty: 5 tablet, Refills: 0    predniSONE (DELTASONE) 10 MG tablet Prednisone dosing: Take prednisone 60 mg (6tabs) for 4 days. Then taper to 50 mg (5tabs) for 4 days, then 40 mg (4 tabs) for 4 days, then 30 mg (3 tabs) for 4 days, 20 mg (2 tabs) for 3 days, then 10 mg  (1 tabs) for 2 days, then OFF. Qty: 80 tablet, Refills: 0      CONTINUE these medications which have NOT CHANGED   Details  aspirin EC 81 MG tablet Take 81 mg by mouth at bedtime.      cholecalciferol (VITAMIN D) 1000 UNITS tablet Take 1,000 Units by mouth at bedtime.     clopidogrel (PLAVIX) 75 MG tablet Take 1 tablet (75 mg total) by mouth daily. -- Office visit needed for further refills Qty: 90 tablet, Refills: 0    furosemide (LASIX) 40 MG tablet Take 1 tablet (40 mg total) by mouth daily. Qty: 30 tablet, Refills: 6    montelukast (SINGULAIR) 10 MG tablet Take 1 tablet (10 mg total) by mouth at bedtime. Qty: 90 tablet, Refills: 3   Associated Diagnoses: Allergic rhinitis, unspecified allergic rhinitis type    nitroGLYCERIN (NITROSTAT) 0.4 MG SL tablet Place 1 tablet (0.4 mg total) under the tongue every 5 (five) minutes x 3 doses as needed for chest pain. Qty: 25 tablet, Refills: 3    Omega-3 Fatty Acids (FISH OIL) 1000 MG CAPS Take 1,000 mg by mouth at bedtime.     OXYGEN Inhale 2.5 L into the lungs continuous.    potassium chloride (KLOR-CON 10) 10 MEQ tablet Take 0.5 tablets (5 mEq total) by mouth daily. Qty: 45 tablet, Refills: 3    ranitidine (ZANTAC) 150 MG tablet TAKE 1 TABLET BY MOUTH TWICE DAILY Qty: 180 tablet, Refills: 3    sertraline (ZOLOFT) 50 MG tablet Take 1 tablet (50 mg total) by mouth daily. Qty: 90 tablet, Refills: 1    vitamin E 400 UNIT capsule Take 400 Units by mouth at bedtime.     simvastatin (ZOCOR) 40 MG tablet Take 1 tablet (40  mg total) by mouth daily. Qty: 90 tablet, Refills: 3    tolterodine (DETROL) 2 MG tablet Take 1 tablet (2 mg total) by mouth at bedtime. Qty: 90 tablet, Refills: 1      STOP taking these medications     metoprolol succinate (TOPROL XL) 25 MG 24 hr tablet          Brief H and P: For complete details please refer to admission H and P, but in briefLois K Kelly is an 83 y.o. female with a PMH of pulmonary fibrosis on chronic home oxygen due to exposure at the [facility] site, multivessel CAD (chronic total occlusion of the mid RCA, mid LAD, 99% stenosis of mid-distal circumflex by cath done  07/2016), combined systolic and diastolic CHF (EF 34-74 percent/grade 2 diastolic dysfunction by echo 07/2016), hypertension,and hyperlipidemia who was admitted 01/03/17 with a chief complaint of a 3 day history of shortness of breath.  Hospital Course:     Acute on chronic respiratory failure with hypoxia (HCC)  secondary to interstitial pulmonary fibrosis with acute pneumonitis and bronchitis, acute on chronic combined systolic and diastolic CHF with ischemic cardiomyopathy - Multifactorial. Maintained on 2.5 L of nasal cannula oxygen at home  - Pulmonology and cardiology were consulted. Currently improving, no wheezing, on 3-3.5 L, plan for home O2 evaluation prior to discharge. - Patient was placed on IV antibiotics, scheduled to duo nebs, IV Solu-Medrol. Per pulmonology recommendations, discharged on 3 week steroid taper.Start 60 mg prednisone 3/13, continue for 5 days. Then 50 mg for 4 days, 40 mg for 4 days, 30 mg for 4 days, 20 mg 3 days, 10 mg 2 days  - Outpatient follow-up with pulmonology and cardiology scheduled. -Palliative medicine consult was obtained given history of advanced pulmonary disease, advanced CAD, ischemic cardiomyopathy, however patient has opted not to accept hospice services at this time. Per cardiology she is not candidate for invasive procedures and is being treated with medical management. Patient was able to wean from high flow oxygen requirements during this admission to 3 L O2 at this time - She is also cleared from cardiology service to be discharged home, recommended Lasix 40 mg daily  Hyperlipidemia Continue omega-3 fatty acids and statin therapy.    CAD S/P percutaneous coronary angioplasty 1985/non-ST elevation MI Not a candidate for surgery secondary to age/comorbidities, and not a good PCI candidate based on prior cath. Treated with IV heparin for > 48 hours. Continue aspirin/Plavix, statin. Cardiology following.    COPD mixed type (HCC)/HCAP Respiratory  virus panel negative.  -  continue duo nebs, prednisone taper as outlined above  - Repeat chest x-ray showed concerns for left lower lobe infiltrate, patient was placed back on Levaquin, will continue for 5 days.    Acute on chronic combined systolic and diastolic CHF (congestive heart failure) (HCC) BNP elevated at 960 on admission. Has diuresed with Lasix 40 mg IV twice a dayduring hospitalization.    Not on a beta blocker or ACE inhibitor/ARB secondary to hypotension. Per cardiology, DC on oral Lasix 40 mg daily    Pulmonary fibrosis (HCC)/Classic UIP Supposed be on Anoro but she had not been taking it.  Continue chronic oxygen, wean as tolerated to home dose of oxygen. Continue steroids per PCCM recommendations, will need prolonged taper over 2-3 weeks. Will need pulmonology follow-up one week post discharge. Prognosis less than 6 months per palliative care evaluation. May be a hospice candidate. Palliative continues to follow for discharge planning.  AKI Creatinine 0.99 on admission, trended up to 1.36 on 01/07/17. Lasix was heldon 3/12. Creatinine has improved to 1.0 at the time of discharge. Home dose of Lasix resumed.    Steroid-induced hyperglycemia Glucoses mildly elevated on a.m. labs, likely from steroids. Monitor trend and initiate SSI if indicated.    Normocytic anemia Likely anemia of chronic disease.    Leukocytosis Monitor closely. Hopefully just a steroid effect.Comment trending down    Anxiety state Started low-dose Xanax., PCP to follow   Day of Discharge BP 92/71 (BP Location: Right Arm)   Pulse 82   Temp 97.5 F (36.4 C) (Oral)   Resp 20   Ht 4\' 10"  (1.473 m)   Wt 62.9 kg (138 lb 9.6 oz)   SpO2 98%   BMI 28.97 kg/m   Physical Exam: General: Alert and awake oriented x3 not in any acute distress. HEENT: anicteric sclera, pupils reactive to light and accommodation CVS: S1-S2 clear no murmur rubs or gallops Chest: clear to auscultation bilaterally,  no wheezing rales or rhonchi Abdomen: soft nontender, nondistended, normal bowel sounds Extremities: no cyanosis, clubbing or edema noted bilaterally Neuro: Cranial nerves II-XII intact, no focal neurological deficits   The results of significant diagnostics from this hospitalization (including imaging, microbiology, ancillary and laboratory) are listed below for reference.    LAB RESULTS: Basic Metabolic Panel:  Recent Labs Lab 01/08/17 0521 01/09/17 0438  NA 138 136  K 4.1 4.1  CL 99* 96*  CO2 29 34*  GLUCOSE 157* 186*  BUN 41* 46*  CREATININE 1.09* 1.04*  CALCIUM 8.8* 8.7*   Liver Function Tests:  Recent Labs Lab 01/03/17 1117 01/08/17 0521  AST 23 16  ALT 9* 9*  ALKPHOS 63 60  BILITOT 1.1 0.7  PROT 7.2 6.3*  ALBUMIN 3.4* 3.1*   No results for input(s): LIPASE, AMYLASE in the last 168 hours. No results for input(s): AMMONIA in the last 168 hours. CBC:  Recent Labs Lab 01/03/17 1117  01/08/17 0521 01/09/17 0438  WBC 16.4*  < > 20.4* 17.1*  NEUTROABS 13.6*  --   --   --   HGB 9.9*  < > 9.4* 9.6*  HCT 31.5*  < > 29.1* 30.4*  MCV 96.6  < > 94.5 93.8  PLT 211  < > 322 335  < > = values in this interval not displayed. Cardiac Enzymes:  Recent Labs Lab 01/03/17 1932 01/04/17 0243  TROPONINI 3.19* 2.44*   BNP: Invalid input(s): POCBNP CBG:  Recent Labs Lab 01/07/17 1209 01/07/17 1732  GLUCAP 143* 122*    Significant Diagnostic Studies:  Dg Chest Port 1 View  Result Date: 01/04/2017 CLINICAL DATA:  Initial evaluation for increased oxygen demand. History of pulmonary fibrosis and CHF. EXAM: PORTABLE CHEST 1 VIEW COMPARISON:  Prior radiograph from 01/03/2017 as well as prior CT from 11/28/2016. FINDINGS: Stable cardiomegaly. Mediastinal silhouette within normal limits. Aortic atherosclerosis noted. Lung volumes mildly reduced. Advanced changes related to underlying pulmonary fibrosis again seen. Mild blunting of left costophrenic angle is similar to  previous, suspected to be chronic. No definite pleural effusion. No overt superimposed pulmonary edema appreciated. No consolidative airspace opacity. No pneumothorax. Osseous structures unchanged.  Osteopenia noted. IMPRESSION: 1. Advanced pulmonary fibrotic lung changes, relatively unchanged from previous. No definite superimposed acute cardiopulmonary abnormality identified. 2. Stable cardiomegaly. 3. Aortic atherosclerosis. Electronically Signed   By: Jeannine Boga M.D.   On: 01/04/2017 01:03   Dg Chest Port 1 View  Result Date: 01/03/2017 CLINICAL  DATA:  Shortness of breath EXAM: PORTABLE CHEST 1 VIEW COMPARISON:  11/28/2016 FINDINGS: There is cardiac enlargement. Aortic atherosclerosis identified. Diffuse bilateral interstitial reticular opacities are identified compatible with pulmonary fibrosis. No superimposed airspace consolidation identified. IMPRESSION: 1. Advanced changes of pulmonary fibrosis. Electronically Signed   By: Kerby Moors M.D.   On: 01/03/2017 11:33    2D ECHO:   Disposition and Follow-up: Discharge Instructions    ([place]) Call MD:  Anytime you have any of the following symptoms: 1) 3 pound weight gain in 24 hours or 5 pounds in 1 week 2) shortness of breath, with or without a dry hacking cough 3) swelling in the hands, feet or stomach 4) if you have to sleep on extra pillows at night in order to breathe.    Complete by:  As directed    Diet - low sodium heart healthy    Complete by:  As directed    Increase activity slowly    Complete by:  As directed        [facility]. Go on 01/22/2017.   Specialty:  Pulmonology Why:  @11 :30am Contact information: [place] [facility] Follow up.   Why:  They will do your home health care at your home Contact information: [place] [address] [phone]        [facility] Meng, [place] Follow up on 01/23/2017.   Specialties:  Cardiology, Radiology Why:  Cardiology Hospital Follow-Up on 01/23/2017 at 1:30PM (Dr. Jacalyn Lefevre Physician Assistant).  Contact information: [address] [place] [address] [phone]        Binnie Rail, MD. Go on 01/18/2017.   Specialty:  Internal Medicine Why:  @1 :00pm Contact information: [place] [address] [phone]            Time spent on Discharge  40 minutes   Signed:   RAI,RIPUDEEP M.D. [facility] 01/09/2017, 12:47 PM Pager: [phone]

## 2017-01-09 NOTE — Progress Notes (Signed)
Pharmacy Antibiotic Note Linda Kelly is a 83 y.o. female admitted on 01/03/2017 with pneumonia. Started levaquin yesterday, now switching to po, likely discharge today. crcl ~ 30 ml/min.  Plan: Adjust levaquin to 250 mg daily base on renal function Pharmacy sign off.    Height: 4\' 10"  (147.3 cm) Weight: 138 lb 9.6 oz (62.9 kg) IBW/kg (Calculated) : 40.9  Temp (24hrs), Avg:97.5 F (36.4 C), Min:97.5 F (36.4 C), Max:97.5 F (36.4 C)   Recent Labs Lab 01/05/17 0842 01/06/17 0420 01/07/17 0402 01/08/17 0521 01/09/17 0438  WBC 12.2* 8.5 20.4* 20.4* 17.1*  CREATININE 0.88 1.09* 1.36* 1.09* 1.04*    Estimated Creatinine Clearance: 30.5 mL/min (by C-G formula based on SCr of 1.04 mg/dL (H)).    No Known Allergies  Antimicrobials this admission: 3/8 Levaquin >> 3/10, 3/13 >>   Microbiology results: 3/8 MRSA PCR: negative   Thank you for allowing pharmacy to be a part of this patient's care.  Maryanna Shape, PharmD, BCPS  Clinical Pharmacist  Pager: [phone]    01/09/2017 12:22 PM

## 2017-01-09 NOTE — Progress Notes (Signed)
Progress Note  Patient Name: Linda Kelly Date of Encounter: 01/09/2017  Primary Cardiologist: Dr. Stanford Breed  Subjective   Denies any chest pain or palpitations. Reports breathing is close to baseline.   Inpatient Medications    Scheduled Meds: . aspirin EC  81 mg Oral QHS  . atorvastatin  40 mg Oral q1800  . clopidogrel  75 mg Oral Daily  . enoxaparin (LOVENOX) injection  40 mg Subcutaneous Q24H  . famotidine  10 mg Oral Daily  . furosemide  40 mg Oral Daily  . levofloxacin (LEVAQUIN) IV  500 mg Intravenous Q24H  . montelukast  10 mg Oral QHS  . omega-3 acid ethyl esters  1 g Oral BID  . potassium chloride  5.0667 mEq Oral Daily  . predniSONE  60 mg Oral Q breakfast  . sertraline  50 mg Oral Daily   Continuous Infusions:  PRN Meds: acetaminophen, ALPRAZolam, ipratropium-albuterol, ondansetron (ZOFRAN) IV, zolpidem   Vital Signs    Vitals:   01/08/17 1151 01/08/17 2014 01/09/17 0433 01/09/17 0623  BP: 101/72 90/68 92/71    Pulse: 74 81 82   Resp: 20 20 20    Temp:  97.5 F (36.4 C) 97.5 F (36.4 C)   TempSrc:  Oral Oral   SpO2: 98% 99% 98%   Weight:    138 lb 9.6 oz (62.9 kg)  Height:        Intake/Output Summary (Last 24 hours) at 01/09/17 0845 Last data filed at 01/09/17 0600  Gross per 24 hour  Intake              340 ml  Output              871 ml  Net             -531 ml   Filed Weights   01/08/17 0350 01/08/17 0614 01/09/17 0623  Weight: 141 lb 8.6 oz (64.2 kg) 137 lb 9.6 oz (62.4 kg) 138 lb 9.6 oz (62.9 kg)    Telemetry   NSR, HR in 60's - 70's.  - Personally Reviewed  ECG    No new tracings.   Physical Exam   General: Well developed, elderly Caucasian female appearing in no acute distress. Head: Normocephalic, atraumatic.  Neck: Supple without bruits, JVD not elevated. Lungs:  Resp regular and unlabored, no wheezing or rales appreciated. Heart: RRR, S1, S2, no S3, S4, or murmur; no rub. Abdomen: Soft, non-tender, non-distended with  normoactive bowel sounds. No hepatomegaly. No rebound/guarding. No obvious abdominal masses. Extremities: No clubbing, cyanosis, or lower extremity edema. Distal pedal pulses are 2+ bilaterally. Neuro: Alert and oriented X 3. Moves all extremities spontaneously. Psych: Normal affect.  Labs    Chemistry Recent Labs Lab 01/03/17 1117  01/07/17 0402 01/08/17 0521 01/09/17 0438  NA 138  < > 136 138 136  K 4.2  < > 4.0 4.1 4.1  CL 103  < > 95* 99* 96*  CO2 26  < > 29 29 34*  GLUCOSE 116*  < > 159* 157* 186*  BUN 22*  < > 46* 41* 46*  CREATININE 0.99  < > 1.36* 1.09* 1.04*  CALCIUM 8.6*  < > 9.2 8.8* 8.7*  PROT 7.2  --   --  6.3*  --   ALBUMIN 3.4*  --   --  3.1*  --   AST 23  --   --  16  --   ALT 9*  --   --  9*  --  ALKPHOS 63  --   --  60  --   BILITOT 1.1  --   --  0.7  --   GFRNONAA 50*  < > 34* 45* 47*  GFRAA 58*  < > 40* 52* 55*  ANIONGAP 9  < > 12 10 6   < > = values in this interval not displayed.   Hematology  Recent Labs Lab 01/07/17 0402 01/08/17 0521 01/09/17 0438  WBC 20.4* 20.4* 17.1*  RBC 3.26* 3.08* 3.24*  HGB 9.7* 9.4* 9.6*  HCT 30.5* 29.1* 30.4*  MCV 93.6 94.5 93.8  MCH 29.8 30.5 29.6  MCHC 31.8 32.3 31.6  RDW 14.7 15.0 14.5  PLT 293 322 335    Cardiac Enzymes  Recent Labs Lab 01/03/17 1514 01/03/17 1932 01/04/17 0243  TROPONINI 4.04* 3.19* 2.44*     Recent Labs Lab 01/03/17 1128  TROPIPOC 2.15*     BNP  Recent Labs Lab 01/03/17 1117  BNP 960.4*     DDimer No results for input(s): DDIMER in the last 168 hours.   Radiology    Dg Chest 2 View  Result Date: 01/07/2017 CLINICAL DATA:  Shortness of breath.  Pulmonary fibrosis. EXAM: CHEST  2 VIEW COMPARISON:  01/04/2017. FINDINGS: Normal sized heart with an interval decrease in size. Decreased prominence of the pulmonary vasculature and interstitial markings. Residual interstitial prominence throughout both lungs. Diffuse osteopenia and bilateral shoulder degenerative changes.  No pleural fluid. IMPRESSION: Resolved changes of congestive heart failure with residual chronic interstitial lung disease. Electronically Signed   By: Claudie Revering M.D.   On: 01/07/2017 08:38    Cardiac Studies   Echocardiogram: 08/10/2016 Study Conclusions  - Left ventricle: The cavity size was normal. Wall thickness was   normal. Systolic function was mildly to moderately reduced. The   estimated ejection fraction was in the range of 40% to 45%.   Moderate hypokinesis of the basal-midanterolateral and   inferolateral myocardium; consistent with ischemia in the   distribution of the left circumflex coronary artery. Features are   consistent with a pseudonormal left ventricular filling pattern,   with concomitant abnormal relaxation and increased filling   pressure (grade 2 diastolic dysfunction). - Mitral valve: Moderately calcified annulus. There was mild to   moderate regurgitation directed centrally. - Left atrium: The atrium was mildly dilated  Cardiac Catheterization: 54/00/8676  Complicated procedure that demonstrated high-grade obstruction in the right iliac artery preventing the procedure from being completed and requiring crossover to the left femoral artery.  Inability to advance the pulmonary artery catheter into the pulmonary artery or capillary wedge position. RV systolic pressure is mildly to moderately elevated.  Severe multivessel coronary disease with chronic total occlusion of the mid RCA collateralized by right to right and left-to-right sources.  Total occlusion of the mid LAD beyond a large first diagonal. Left left collaterals are noted.  99% stenosis in the mid to distal native circumflex beyond a very tortuous segment. PCI was not attempted. Distal territory is relatively small.  Inferior wall severe hypokinesis involving the basal to mid segment. Anterior and apical mild hypokinesis is noted. Ejection fraction is estimated to be 35-45%. Presentation was  that of acute on chronic combined systolic and diastolic heart failure.  RECOMMENDATIONS:   Medical therapy with optimization of heart failure regimen.  Antiplatelet therapy.  Given the patient's age, I do not believe surgical revascularization is warranted/prudent.  Only PCI target would be the mid circumflex, which I do not believe would change her  clinical status (not having ischemic pain or evidence of ongoing infarction). There would be relatively high risk of inability to deliver stent and/or primary failure due to tortuosity.  Patient Profile     83 y.o. female w/ PMH of CAD (severe, multivessel with CTO of RCA, CTO of mild-LAD beyond 1st Diag, and 99% mid-distal LCx disease by cath in 07/2016 - not felt to be a surgical candidate), pulmonary fibrosis, carotid artery disease, HLD, and PVD admitted on 01/03/2017 with worsening dyspnea. Cardiology consulted for CHF and elevated troponin.   Assessment & Plan    1. Acute on Chronic Combined Systolic and Diastolic CHF/ Ischemic Cardiomyopathy - BNP elevated at 960 on admission. Echo in 07/2016 showed a reduced EF of 40-45% with Grade 2 DD.  - diuresed with IV Lasix 40mg  BID with a recorded net output of -3.8L, yet weights have been variable but overall negative (146 lbs --> 138 lbs).  IV Lasix discontinued in the setting of her improved respiratory status and AKI. Restarted on PTA PO Lasix 40mg  daily on 01/08/2017. - recommend resuming low-dose cardioselective BB as an outpatient if BP allows.   2. Type 2 NSTEMI/ CAD - she has known severe multivessel with CTO of RCA, CTO of mild-LAD beyond 1st Diag, and 99% mid-distal LCx disease by cath in 07/2016. Was not felt to be a surgical candidate secondary to her age and co-morbidities.  - peak troponin of 4.04, trending down to 2.44 on 3/9. Medically treated with Heparin for > 48 hours. Not a surgical candidate or good PCI candidate based on cath in 07/2016. Agree with Palliative Care  consultation.  - continue ASA, Plavix, and statin.   3. Pulmonary Fibrosis - initially on 15L, now at 98% on 3.5L [place]. Was using 2.5L [place] prior to admission. - Pulmonology following.   4. HTN - BP at 90/68 - 101/72  in the past 24 hours. - PTA anti-hypertensive medications currently held with episodes of hypotension.   5. AKI - creatinine 0.99 on admission, trending up to 1.36 on 3/12, improved to 1.04 this AM.   6. Leukocytosis - WBC improved from 20.4 on 3/13 to 17.1 this AM . - likely secondary to steroid use as she is receiving Solu-Medrol. Switched to Steroid Taper by Pulmonology.   Arna Medici , PA-C 8:45 AM 01/09/2017 Pager: [phone]  I have seen and examined the patient along with Erma Heritage , PA-C.  I have reviewed the chart, notes and new data.  I agree with PA/NP's note.  PLAN: OK for DC today from our standpoint. Low BP precludes restarting beta blocker, but this should be tried as outpatient. Arrange early transition of care visit in 1 week. Reinforce meticulous sodium restriction, daily weight monitoring, immediate call for advice if weight increases 3 lb or more from current weight.  Sanda Klein, MD, [place] [phone] 01/09/2017, 9:51 AM

## 2017-01-09 NOTE — Progress Notes (Signed)
Physical Therapy Treatment Patient Details Name: Linda Kelly MRN: [id number] DOB: Feb 09, 1928 Today's Date: 01/09/2017    History of Present Illness pt presents with Acute on Chronic Respiratory Failure and NSTEMI.  pt with a hx of Pulmonary fibrosis, HF, CAD, Depression, Anxiety, PVD, COPD, Vertigo, Esophageal CA, MI, and CVA.      PT Comments     Pt presented sitting on [facility] when therapist entered room. Pt making progress with mobility, but continues to require increased supplemental O2 with activity. Pt on 3.5L of O2 at rest with SPO2 at 90-91%. With activity, pt requiring 4L of O2 then 6L of O2 as she desats to as low as 80%. She also required standing rest breaks x2 with focus on deep breathing to increase SPO2 to 88-89%. Pt would continue to benefit from skilled physical therapy services at this time while admitted and after d/c to address her limitations in order to improve her overall safety and independence with functional mobility.      Follow Up Recommendations  Home health PT;Supervision/Assistance - 24 hour     Equipment Recommendations  Wheelchair (measurements PT);Wheelchair cushion (measurements PT);Other (comment) (rollator)    Recommendations for Other Services       Precautions / Restrictions Precautions Precautions: Fall Precaution Comments: Watch O2 Restrictions Weight Bearing Restrictions: No    Mobility  Bed Mobility Overal bed mobility: Modified Independent                Transfers Overall transfer level: Needs assistance Equipment used: Rolling walker (2 wheeled) Transfers: Sit to/from Stand Sit to Stand: Supervision         General transfer comment: increased time, supervision for safety  Ambulation/Gait Ambulation/Gait assistance: Min guard Ambulation Distance (Feet): 150 Feet Assistive device: Rolling walker (2 wheeled) Gait Pattern/deviations: Step-through pattern;Decreased stride length Gait velocity: decreased Gait velocity  interpretation: Below normal speed for age/gender General Gait Details: pt began ambulating on 4-6L of O2 with SPO2 decreasing to as low as 80%, requiring standing rest breaks x2 with focus on deep breathing technique   Stairs            Wheelchair Mobility    Modified Rankin (Stroke Patients Only)       Balance Overall balance assessment: Needs assistance Sitting-balance support: No upper extremity supported Sitting balance-Leahy Scale: Fair     Standing balance support: No upper extremity supported Standing balance-Leahy Scale: Fair                      Cognition Arousal/Alertness: Awake/alert Behavior During Therapy: WFL for tasks assessed/performed Overall Cognitive Status: Within Functional Limits for tasks assessed                      Exercises      General Comments        Pertinent Vitals/Pain Pain Assessment: No/denies pain    Home Living                      Prior Function            PT Goals (current goals can now be found in the care plan section) Acute Rehab PT Goals PT Goal Formulation: With patient Time For Goal Achievement: 01/21/17 Potential to Achieve Goals: Fair Progress towards PT goals: Progressing toward goals    Frequency    Min 3X/week      PT Plan Current plan remains appropriate    Co-evaluation  End of Session Equipment Utilized During Treatment: Gait belt;Oxygen Activity Tolerance: Patient limited by fatigue Patient left: in bed;with call bell/phone within reach;with family/visitor present Nurse Communication: Mobility status PT Visit Diagnosis: Unsteadiness on feet (R26.81);Muscle weakness (generalized) (M62.81)     Time: 3967-2897 PT Time Calculation (min) (ACUTE ONLY): 20 min  Charges:  $Gait Training: 8-22 mins                    G CodesClearnce Sorrel Jager Koska 01-24-2017, 1:55 PM Sherie Don, [place], DPT [phone]

## 2017-01-10 ENCOUNTER — Other Ambulatory Visit: Payer: Self-pay

## 2017-01-10 ENCOUNTER — Telehealth: Payer: Self-pay | Admitting: Internal Medicine

## 2017-01-10 DIAGNOSIS — J449 Chronic obstructive pulmonary disease, unspecified: Secondary | ICD-10-CM

## 2017-01-10 NOTE — Telephone Encounter (Signed)
Order sent to [facility] for neb machine and supplies Pt aware and nothing further needed

## 2017-01-10 NOTE — Patient Outreach (Signed)
[facility]) Care Management  01/10/2017  Linda Kelly 1928/06/09 [id number]  Subjective: Client reports feeling betters since discharge. She is not at her usual activity, but is better than on admission  Objective: none  Assessment: 83 year old with recent admission 3/8-3/14 with heart failure, respiratory failure, HCAP. Client reports feeling better. Client does not have home health nursing involved, however she is scheduled for a scheduled for a physical therapy visit.  Medications reviewed-Daughter assist client with medication management although client is knowledgeable about her medications. Client reports she does not have the nebulizer machine. Linda Kelly reports she has all her medications including duoneb, but does not have a nebulizer machine.  RNCM called Dr. Kathee Polite with Linda Kelly regarding client's DME needs. Informed that client was discharged home with duoneb medication, but does not have a machine-Request nebulizer order be [facility] to [facility] on [address] (per daughter's request).  No other issues noted at this time.  Plan: transition of care Program. Home visit next week.    Linda Silversmith, RN, MSN, [place] Coordinator Cell: [phone]

## 2017-01-10 NOTE — Patient Outreach (Signed)
[facility]) Care Management  01/10/2017  Linda Kelly Aug 09, 1928 [id number]  Care Coordination-Call to Dr. Janee Morn office. Discussed client sent home with duoneb medication, however does not have a nebulizer machine at home. RNCM received a return call from [place] who will discuss with Dr. Annamaria Boots and call client/her daughter with further instructions.  Plan: continue to follow.  Thea Silversmith, RN, MSN, [place] Coordinator Cell: [phone]

## 2017-01-10 NOTE — Telephone Encounter (Signed)
Yes- ok to order nebulizer compressor and Rx for her neb solution, refillable for a year, through her DME  For dx COPD mixed type

## 2017-01-10 NOTE — Telephone Encounter (Signed)
Spoke with Linda Kelly  She states that the pt was recently d/c'ed from hospital with Duoneb  She has the medication but no machine or supplies  Looks like med was prescribed by Dr. Nira Conn  Is it okay to send order for machine and supplies? Please advise, thanks!

## 2017-01-14 ENCOUNTER — Other Ambulatory Visit: Payer: Self-pay

## 2017-01-14 NOTE — Patient Outreach (Signed)
[facility]) Care Management  01/14/2017  MASHELLE BUSICK 1927/11/12 [id number]   Assessment: RNCM received report that client called the 24 hour nurse advice line on 3/17 regarding a slight sore throat. Client denies any questions or concerns at this time. Client reports she no longer has a sore throat.  Plan: previous scheduled home visit tomorrow.

## 2017-01-15 ENCOUNTER — Other Ambulatory Visit: Payer: Self-pay

## 2017-01-16 NOTE — Patient Outreach (Signed)
[facility]) Care Management   01/16/2017  Linda Kelly 15-Oct-1928 [id number]  Linda Kelly is an 83 y.o. female  Subjective: Client reports she is better since discharge, slowly improving in strength.  Objective:   Review of Systems  Respiratory:       Fine inspiratory crackles noted bilaterally.   Cardiovascular:       Heart rate regular, no edema noted.    Physical Exam  Encounter Medications:   Outpatient Encounter Prescriptions as of 01/15/2017  Medication Sig Note  . acetaminophen (TYLENOL) 650 MG CR tablet Take 650 mg by mouth daily.   Marland Kitchen ALPRAZolam (XANAX) 0.25 MG tablet Take 1 tablet (0.25 mg total) by mouth 3 (three) times daily as needed for anxiety.   Marland Kitchen aspirin EC 81 MG tablet Take 81 mg by mouth at bedtime.  01/03/2017: Pt took 324mg  this am  . cholecalciferol (VITAMIN D) 1000 UNITS tablet Take 1,000 Units by mouth at bedtime.  01/15/2017: Taking 2000IU  . clopidogrel (PLAVIX) 75 MG tablet Take 1 tablet (75 mg total) by mouth daily. -- Office visit needed for further refills   . furosemide (LASIX) 40 MG tablet Take 1 tablet (40 mg total) by mouth daily.   . montelukast (SINGULAIR) 10 MG tablet Take 1 tablet (10 mg total) by mouth at bedtime.   . nitroGLYCERIN (NITROSTAT) 0.4 MG SL tablet Place 1 tablet (0.4 mg total) under the tongue every 5 (five) minutes x 3 doses as needed for chest pain.   . Omega-3 Fatty Acids (FISH OIL) 1000 MG CAPS Take 1,000 mg by mouth at bedtime.  01/15/2017: Taking 500mg  EPA RxOmega-3  . OXYGEN Inhale 2.5 L into the lungs continuous.   . potassium chloride (KLOR-CON 10) 10 MEQ tablet Take 0.5 tablets (5 mEq total) by mouth daily.   . predniSONE (DELTASONE) 10 MG tablet Prednisone dosing: Take prednisone 60 mg (6tabs) for 4 days. Then taper to 50 mg (5tabs) for 4 days, then 40 mg (4 tabs) for 4 days, then 30 mg (3 tabs) for 4 days, 20 mg (2 tabs) for 3 days, then 10 mg  (1 tabs) for 2 days, then OFF.   . ranitidine (ZANTAC) 150 MG  tablet TAKE 1 TABLET BY MOUTH TWICE DAILY (Patient taking differently: Take 150 mg by mouth 2 (two) times daily. )   . sertraline (ZOLOFT) 50 MG tablet Take 1 tablet (50 mg total) by mouth daily.   . simvastatin (ZOCOR) 40 MG tablet Take 1 tablet (40 mg total) by mouth daily.   . vitamin E 400 UNIT capsule Take 400 Units by mouth at bedtime.    Marland Kitchen ipratropium-albuterol (DUONEB) 0.5-2.5 (3) MG/3ML SOLN Take 3 mLs by nebulization every 4 (four) hours as needed. (Patient not taking: Reported on 01/10/2017)   . levofloxacin (LEVAQUIN) 250 MG tablet Take 1 tablet (250 mg total) by mouth daily. X 5 days (Patient not taking: Reported on 01/15/2017)   . tolterodine (DETROL) 2 MG tablet Take 1 tablet (2 mg total) by mouth at bedtime. (Patient not taking: Reported on 01/10/2017)    No facility-administered encounter medications on file as of 01/15/2017.     Functional Status:   In your present state of health, do you have any difficulty performing the following activities: 01/15/2017 01/03/2017  Hearing? [name]  Vision? Y Y  Difficulty concentrating or making decisions? N N  Walking or climbing stairs? Y N  Dressing or bathing? Y Y  Doing errands, shopping? [name]  Preparing Food and eating ? Y -  Using the Toilet? N -  In the past six months, have you accidently leaked urine? Y -  Do you have problems with loss of bowel control? N -  Managing your Medications? N -  Managing your Finances? N -  Housekeeping or managing your Housekeeping? N -  Some recent data might be hidden    Fall/Depression Screening:    PHQ 2/9 Scores 01/15/2017 03/27/2016 02/21/2016 12/28/2013  PHQ - 2 Score 0 0 1 0   Fall Risk  01/15/2017 03/27/2016 02/21/2016 12/28/2013  Falls in the past year? No Yes Yes No  Number falls in past yr: - 1 1 -  Injury with Fall? - Yes No -  Risk Factor Category  - High Fall Risk - -  Risk for fall due to : - History of fall(s) Impaired balance/gait -  Follow up - - Falls evaluation completed -     Assessment:  83 year old with recent admission 3/8-3/14 for Heart failure, respiratory failure, healthcare acquired pneumonia. History of copd, MI, HTN, pulmonary fibrosis.  Client has daughter and son. Daughter has been staying with her since her discharge.   Client on continuous oxygen per nasal cannula(was on oxygen prior to hospitalization. Client reports she was instructed at discharge to keep oxygen at 3l/[place] until seen by physician.  Medications reviewed. Client had question about Metoprolol. She was on prior to hospitalization, but not discharged home on Metoprolol. RNCM reinforced per discharge instructions that blood pressure was low in the hospital therefore was not restarted at discharge. Physician to reevaluate at follow up visit. Client and her daughter states they will follow up with both primary care and cardiologist at next visit.    Re: heart failure-weighs self daily and records weights. Reviewed Heart Failure zone tool.  Re: COPD-reviewed basics of COPD zone tool.  Client seems to be improving and gaining strength back slowly. No additional questions or issues noted.  Plan: transition of care call next week. Continue heart failure education/zone tool.  Thea Silversmith, RN, MSN, [place] Coordinator Cell: [phone]

## 2017-01-17 ENCOUNTER — Other Ambulatory Visit: Payer: Self-pay

## 2017-01-17 NOTE — Progress Notes (Signed)
Subjective:    Patient ID: Linda Kelly, female    DOB: 1928/10/27, 83 y.o.   MRN: [id number]  HPI The patient is here for follow up from the hospital  Admitted 01/03/17 - 01/09/17 for acute on chronic respiratory failure with hypoxia.   She went to the ED with 3 days of SOB.  She has pulmonary fibrosis, acute on chronic systolic and diastolic CHF, CAD s/p NSTEMI and COPD.   Acute on chronic resp failure with hypoxia was secondary to pulmonary fibrosis with acute pneumonitis and bronchitis and acute on chronic diastolic and systolic CHF:  Cardiology and pulmonary were consulted.  She was on 3.5 L of oxygen in hospital (home was 2.5 L).  She had IV antibitiocs, nebs, IV steroids. She was discharged on a 3 week steroid taper.  Palliative care saw patient (felt she had less than 6 months to live)- she did not want hospice at this time.  She was discharged home on 3 L oxygen via [place] and lasix 40 mg daily .  CAD, NSTEMI, CHF:  She is not a surgical candidate and is being treated medically.  She is on ASA, plavix and statin.  Her weight is stable.   COPD, HCAP:  CXR showed possible LLL infiltrate.  She was started on levaquin.  She had nebs and steroids.  She monitors her oxygen - she is using 3L and wonders if she still needs that.   Anxiety:  She was started xanax if needed.  She has not needed.      Her daughter is taking care of her. She feels she has everything she needs at home.  She is eating better.  She has had constipation for the past several day.  She did not take anything for this.  She did have a bowel movement this morning.  She usually does not have constipation.   She is not sleeping well.  She is getting up at the hospital hours.  She uses the bedside commode.  She wonders if there is something she can take.   She does not feel ready for hospice.  Some of her children feel she needs it, but her and her daughter that cares for her do not feel it is necessary at this time.     Medications and allergies reviewed with patient and updated if appropriate.  Patient Active Problem List   Diagnosis Date Noted  . Steroid-induced hyperglycemia 01/08/2017  . Normocytic anemia 01/08/2017  . Leukocytosis 01/08/2017  . Anxiety state 01/08/2017  . HCAP (healthcare-associated pneumonia) 01/08/2017  . Increased oxygen demand   . Palliative care by specialist   . Encounter for hospice care discussion   . Palliative care encounter   . Goals of care, counseling/discussion   . Acute on chronic respiratory failure with hypoxia ([place]) 01/03/2017  . Demand ischemia ([place])   . Elevated troponin   . Chronic respiratory failure ([place]) 11/29/2016  . Episodic lightheadedness 08/28/2016  . Sleep difficulties 08/28/2016  . Acute on chronic combined systolic and diastolic CHF (congestive heart failure) ([place])   . Pulmonary fibrosis ([place])   . Non-ST elevation (NSTEMI) myocardial infarction ([place]) 08/08/2016  . Lumbar radiculopathy 08/01/2016  . Left-sided low back pain without sciatica 03/28/2016  . Cephalalgia 03/10/2016  . Overactive bladder 02/21/2016  . Poor balance 02/21/2016  . Chronic lower back pain 02/21/2016  . Carotid stenosis-s/p CEA 2015 03/03/2014  . Aortic atherosclerosis ([place]) 02/04/2014  . Change in bowel habits 05/22/2011  .  URINARY INCONTINENCE 12/22/2010  . ABDOMINAL BRUIT 08/10/2010  . FASTING HYPERGLYCEMIA 11/15/2009  . Depression 08/01/2009  . GERD 08/01/2009  . IRRITABLE BOWEL SYNDROME 12/27/2008  . TINNITUS, CHRONIC 11/05/2008  . HEARING LOSS, HIGH FREQUENCY 11/05/2008  . HIATAL HERNIA 08/11/2008  . OTHER NONTHROMBOCYTOPENIC PURPURAS 06/08/2008  . ORTHOSTATIC HYPOTENSION 06/08/2008  . Lung nodule 11/20/2007  . OBESITY, MILD 11/19/2007  . Hyperlipidemia 10/09/2007  . Tobacco abuse, in remission 10/09/2007  . Essential hypertension 10/09/2007  . CAD S/P percutaneous coronary angioplasty 1985 10/09/2007  . PVD (peripheral vascular disease) ([place])  10/09/2007  . COPD mixed type ([place]) 10/09/2007  . VERTIGO 03/31/2007  . Headache 03/31/2007    Current Outpatient Prescriptions on File Prior to Visit  Medication Sig Dispense Refill  . acetaminophen (TYLENOL) 650 MG CR tablet Take 650 mg by mouth daily.    Marland Kitchen ALPRAZolam (XANAX) 0.25 MG tablet Take 1 tablet (0.25 mg total) by mouth 3 (three) times daily as needed for anxiety. 30 tablet 0  . aspirin EC 81 MG tablet Take 81 mg by mouth at bedtime.     . cholecalciferol (VITAMIN D) 1000 UNITS tablet Take 1,000 Units by mouth at bedtime.     . clopidogrel (PLAVIX) 75 MG tablet Take 1 tablet (75 mg total) by mouth daily. -- Office visit needed for further refills 90 tablet 0  . furosemide (LASIX) 40 MG tablet Take 1 tablet (40 mg total) by mouth daily. 30 tablet 6  . ipratropium-albuterol (DUONEB) 0.5-2.5 (3) MG/3ML SOLN Take 3 mLs by nebulization every 4 (four) hours as needed. 360 mL 4  . montelukast (SINGULAIR) 10 MG tablet Take 1 tablet (10 mg total) by mouth at bedtime. 90 tablet 3  . nitroGLYCERIN (NITROSTAT) 0.4 MG SL tablet Place 1 tablet (0.4 mg total) under the tongue every 5 (five) minutes x 3 doses as needed for chest pain. 25 tablet 3  . Omega-3 Fatty Acids (FISH OIL) 1000 MG CAPS Take 1,000 mg by mouth at bedtime.     . OXYGEN Inhale 2.5 L into the lungs continuous.    . potassium chloride (KLOR-CON 10) 10 MEQ tablet Take 0.5 tablets (5 mEq total) by mouth daily. 45 tablet 3  . predniSONE (DELTASONE) 10 MG tablet Prednisone dosing: Take prednisone 60 mg (6tabs) for 4 days. Then taper to 50 mg (5tabs) for 4 days, then 40 mg (4 tabs) for 4 days, then 30 mg (3 tabs) for 4 days, 20 mg (2 tabs) for 3 days, then 10 mg  (1 tabs) for 2 days, then OFF. 80 tablet 0  . ranitidine (ZANTAC) 150 MG tablet TAKE 1 TABLET BY MOUTH TWICE DAILY (Patient taking differently: Take 150 mg by mouth 2 (two) times daily. ) 180 tablet 3  . sertraline (ZOLOFT) 50 MG tablet Take 1 tablet (50 mg total) by mouth  daily. 90 tablet 1  . simvastatin (ZOCOR) 40 MG tablet Take 1 tablet (40 mg total) by mouth daily. 90 tablet 3  . vitamin E 400 UNIT capsule Take 400 Units by mouth at bedtime.      No current facility-administered medications on file prior to visit.     Past Medical History:  Diagnosis Date  . Anxiety   . Anxiety and depression   . Arthritis   . Benign neoplasm of esophagus   . CAD (coronary artery disease)   . Cancer [facility])    Skin cancer on back  . Carotid artery disease ([place])   . CHF (congestive heart  failure) ([place])   . Chronic gastritis   . Colon, diverticulosis   . COPD (chronic obstructive pulmonary disease) ([place])   . Depression   . GERD (gastroesophageal reflux disease)   . Hiatal hernia   . High frequency hearing loss   . Hyperlipidemia   . IBS (irritable bowel syndrome)   . Idiopathic pulmonary fibrosis   . Internal hemorrhoid   . MI (myocardial infarction) 1985  . Other nonthrombocytopenic purpuras   . PONV (postoperative nausea and vomiting)   . PVD (peripheral vascular disease) ([place])   . Shortness of breath    with exertion  . Solitary pulmonary nodule   . Stroke [facility]    Hx: of several mini -strokes  . Tinnitus    chronic  . Vertigo     Past Surgical History:  Procedure Laterality Date  . ANGIOPLASTY  1997, 98, 99  . BREAST SURGERY     Hx; of biopsy  . CARDIAC CATHETERIZATION N/A 08/10/2016   Procedure: Left Heart Cath and Coronary Angiography;  Surgeon: Belva Crome, MD;  Location: [facility];  Service: Cardiovascular;  Laterality: N/A;  . CAROTID ENDARTERECTOMY     left side x 3, saphenous vein graft from left leg  . CATARACT EXTRACTION W/ INTRAOCULAR LENS  IMPLANT, BILATERAL    . COLON SURGERY    . COLONOSCOPY  11-27-01, 10-30-05, 05-24-11   diverticulosis, hemorrhoids  . DENTAL SURGERY     2012   . ENDARTERECTOMY Right 03/03/2014   Procedure: RIGHT CAROTID ENDARTERECTOMY WITH PATCH ANGIOPLASTY;  Surgeon: Rosetta Posner, MD;  Location: [facility];  Service: Vascular;  Laterality: Right;  . FOOT SURGERY     bilateral  . SHOULDER SURGERY     left  . TOE SURGERY     right foot  . TONSILLECTOMY    . TUBAL LIGATION    . UPPER GASTROINTESTINAL ENDOSCOPY  11-27-01, 08-18-08   Hiatal hernia, benign esophagus neoplasia, gastritis, tortuous esophagus 6 Maloney dilation performed    Social History   Social History  . Marital status: Widowed    Spouse name: N/A  . Number of children: 6  . Years of education: N/A   Occupational History  . 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 Smoking status: Former Smoker    Packs/day: 0.50    Years: 60.00    Types: Cigarettes    Quit date: 02/26/2014  . Smokeless tobacco: Never Used  . Alcohol use No  . Drug use: No  . Sexual activity: No   Other Topics Concern  . Not on file   Social History Narrative   [place] to [place] twice a year to visit sister- 3-4 weeks travel there every year.   Pt does not get regular exercise    Family History  Problem Relation Age of Onset  . Heart attack Father     deceased at 33, MGF  . Colon cancer Father   . Prostate cancer Father   . Heart disease Father   . Hyperlipidemia Father   . Heart disease Mother     deaceased at age 47  . Colitis Mother   . Stroke Mother   . Hyperlipidemia Mother   . Colitis Sister   . Hyperlipidemia Sister   . Other Brother     deceased age 37; war  . Hyperlipidemia Brother   . Breast cancer  Maternal Aunt     great   . Lung cancer Paternal Aunt     great  . Vasculitis Son   . Hyperlipidemia Son   . Hypertension Son   . Heart attack Son   . Cancer Sister     unknown  . Hyperlipidemia Sister   . Hyperlipidemia Daughter   . Hypertension Daughter   . COPD Neg Hx   . Asthma Neg Hx     Review of Systems  Constitutional: Negative for fever.       Appetite improving    HENT: Negative for ear pain and sore throat.     Respiratory: Positive for cough (few coughs a day) and shortness of breath. Negative for wheezing.   Cardiovascular: Negative for chest pain, palpitations and leg swelling.  Gastrointestinal: Negative for nausea.       No gerd  Neurological: Positive for dizziness and light-headedness. Negative for headaches.       Objective:   Vitals:   01/18/17 1300  BP: 130/84  Pulse: (!) 101  Resp: 18   Wt Readings from Last 3 Encounters:  01/18/17 137 lb (62.1 kg)  01/15/17 135 lb (61.2 kg)  01/09/17 138 lb 9.6 oz (62.9 kg)   Body mass index is 28.63 kg/m.   Physical Exam    Constitutional: Appears well-developed and well-nourished. No distress.  HENT:  Head: Normocephalic and atraumatic.  Neck: Neck supple. No tracheal deviation present. No thyromegaly present.  No cervical lymphadenopathy Cardiovascular: Normal rate, regular rhythm and normal heart sounds.   No murmur heard.  No edema Pulmonary/Chest: wearing oxygen via [place], mild respiratory distress. Dry crackles throughout.  No has no wheezes.   Skin: Skin is warm and dry. Not diaphoretic.  Psychiatric: Normal mood and affect. Behavior is normal.      Assessment & Plan:    See Problem List for Assessment and Plan of chronic medical problems.   FU in 6 months

## 2017-01-17 NOTE — Patient Outreach (Signed)
[name]) Care Management  01/17/2017  Linda Kelly 11-10-1927 [id number]   RNCM received referral per EMMI red flag on yesterday re: "Lost interest in things they used to enjoy?". RNCM called to follow up. Client reports: "I did not hear the question properly. That answer should not have been a negative answer."   Plan: RNCM will complete transition of care call next week.  Thea Silversmith, RN, MSN, [place] Coordinator Cell: [phone]

## 2017-01-18 ENCOUNTER — Encounter: Payer: Self-pay | Admitting: Internal Medicine

## 2017-01-18 ENCOUNTER — Ambulatory Visit (INDEPENDENT_AMBULATORY_CARE_PROVIDER_SITE_OTHER): Payer: Commercial Managed Care - PPO | Admitting: Internal Medicine

## 2017-01-18 VITALS — BP 130/84 | HR 101 | Resp 18 | Wt 137.0 lb

## 2017-01-18 DIAGNOSIS — J449 Chronic obstructive pulmonary disease, unspecified: Secondary | ICD-10-CM | POA: Diagnosis not present

## 2017-01-18 DIAGNOSIS — I5043 Acute on chronic combined systolic (congestive) and diastolic (congestive) heart failure: Secondary | ICD-10-CM

## 2017-01-18 DIAGNOSIS — J8410 Pulmonary fibrosis, unspecified: Secondary | ICD-10-CM | POA: Diagnosis not present

## 2017-01-18 DIAGNOSIS — J9611 Chronic respiratory failure with hypoxia: Secondary | ICD-10-CM

## 2017-01-18 DIAGNOSIS — I10 Essential (primary) hypertension: Secondary | ICD-10-CM

## 2017-01-18 DIAGNOSIS — F411 Generalized anxiety disorder: Secondary | ICD-10-CM | POA: Diagnosis not present

## 2017-01-18 NOTE — Assessment & Plan Note (Signed)
Advised her to take the xanax as needed - has not taken it Ok to take it at night if needed Advised trying to stick to a schedule of bedtime/ waketime to get her back on a good schedule

## 2017-01-18 NOTE — Assessment & Plan Note (Signed)
Doing better On oxygen 3 L - oxygen sat 98% Sees pulmonary next week, but advised to decresae back to 2.5 L at rest but may still need 3 L with activity - she will monitor at home

## 2017-01-18 NOTE — Progress Notes (Signed)
Pre visit review using our clinic review tool, if applicable. No additional management support is needed unless otherwise documented below in the visit note. 

## 2017-01-18 NOTE — Assessment & Plan Note (Signed)
Sees pulmonary next week °

## 2017-01-18 NOTE — Assessment & Plan Note (Signed)
BP well controlled Current regimen effective and well tolerated Continue current medications at current doses  

## 2017-01-18 NOTE — Assessment & Plan Note (Signed)
Recent hospitalization  Doing better No evidence of fluid overload on exam  has cardiology follow up next week Continue current medications

## 2017-01-18 NOTE — Patient Instructions (Addendum)
  Medications reviewed and updated.  No changes recommended at this time.     Please followup in 6 months   

## 2017-01-21 ENCOUNTER — Telehealth: Payer: Self-pay | Admitting: Internal Medicine

## 2017-01-21 ENCOUNTER — Other Ambulatory Visit: Payer: Self-pay

## 2017-01-21 DIAGNOSIS — R197 Diarrhea, unspecified: Secondary | ICD-10-CM

## 2017-01-21 NOTE — Telephone Encounter (Signed)
Pt called stating that she has had diarrhea for 2 nights. She wanted to talk with someone regarding this issue. Please advise. Thanks [employer]

## 2017-01-21 NOTE — Telephone Encounter (Signed)
Stool culture and test for cdiff ordered - she was recently in the hospital and has been on antibiotics - concern for possible cdiff.  She needs to subit stool sample as soon as she can - needs to get containers from lab.  Needs to maintain good hydration - if she gets dehydrated may need to go to ED.

## 2017-01-21 NOTE — Patient Outreach (Signed)
[facility]) Care Management  01/21/2017  Linda Kelly Jul 07, 1928 [id number]  Subjective: "My stomach is hurting so bad". From the time I wake up-From 10am to 10pm. It has got me feeling so weak. I am loosing weight". Last BM 2 hours ago.  Objective: none-telephonic call  Assessment: 83 year old with recent admission 3/8-3/14 for Heart failure, respiratory failure, healthcare acquired pneumonia. History of copd, MI, HTN, pulmonary fibrosis.  RNCM called re: Red flag EMMI and transition of care. Client reports her main issue today is diarrhea over the weekend. Reports it has her feeling weak. She states it is sometimes liquid and sometimes soft. Weight today is 133 pounds.  Linda Kelly reports she has called her primary care and is awaiting a return response.  Client to call RNCM as needed.  Plan: Update primary care provider. Transition of care call next week.  Thea Silversmith, RN, MSN, [place] Coordinator Cell: [phone]

## 2017-01-21 NOTE — Telephone Encounter (Signed)
Called patient in regard to transition of care.  States patient is having diarrhea from 10am to 10pm for a couple days.  States patient souls weak on phone.  States that stool is soft to liquid and smelly.  Would like office to follow up with patient in regard.

## 2017-01-21 NOTE — Patient Outreach (Signed)
[facility]) Care Management  01/21/2017  Linda Kelly [birth year] [id number]   Patient triggered Red on EMMI heart failure dashboard, notification sent to Thea Silversmith

## 2017-01-21 NOTE — Telephone Encounter (Signed)
Please advise 

## 2017-01-21 NOTE — Patient Outreach (Signed)
[facility]) Care Management  01/21/2017  Linda Kelly November 14, 1927 [id number]   Care Coordination: RNCM called primary care office and spoke with Tammy to inform of client's report of diarrhea with complaint of weakness. Reports has been a couple days. Reports sometimes loose, sometimes soft and smelly.   Plan: continue to follow.  Thea Silversmith, RN, MSN, [place] Coordinator Cell: [phone]

## 2017-01-22 ENCOUNTER — Other Ambulatory Visit: Payer: Commercial Managed Care - PPO

## 2017-01-22 ENCOUNTER — Other Ambulatory Visit: Payer: Self-pay | Admitting: Emergency Medicine

## 2017-01-22 ENCOUNTER — Inpatient Hospital Stay: Payer: Self-pay | Admitting: Adult Health

## 2017-01-22 ENCOUNTER — Other Ambulatory Visit: Payer: Self-pay

## 2017-01-22 DIAGNOSIS — D649 Anemia, unspecified: Secondary | ICD-10-CM

## 2017-01-22 DIAGNOSIS — R197 Diarrhea, unspecified: Secondary | ICD-10-CM

## 2017-01-22 NOTE — Telephone Encounter (Signed)
This encounter was created in error - please disregard.

## 2017-01-22 NOTE — Patient Outreach (Signed)
[facility]) Care Management  01/22/2017  Linda Kelly Dec 31, 1927 [id number]   Patient triggered RED on EMMI Heart Failure dashboard, notification sent to:  Mariann Laster, RN

## 2017-01-22 NOTE — Telephone Encounter (Signed)
Spoke with daughter, she will come to office and pick up specimen cup for stool sample.

## 2017-01-22 NOTE — Progress Notes (Unsigned)
Error

## 2017-01-23 ENCOUNTER — Encounter: Payer: Self-pay | Admitting: Physician Assistant

## 2017-01-23 ENCOUNTER — Other Ambulatory Visit: Payer: Self-pay

## 2017-01-23 ENCOUNTER — Telehealth: Payer: Self-pay | Admitting: Internal Medicine

## 2017-01-23 ENCOUNTER — Ambulatory Visit (INDEPENDENT_AMBULATORY_CARE_PROVIDER_SITE_OTHER): Payer: Commercial Managed Care - PPO | Admitting: Physician Assistant

## 2017-01-23 ENCOUNTER — Other Ambulatory Visit: Payer: Self-pay | Admitting: Physician Assistant

## 2017-01-23 VITALS — BP 113/79 | HR 100 | Ht <= 58 in | Wt 134.0 lb

## 2017-01-23 DIAGNOSIS — I2510 Atherosclerotic heart disease of native coronary artery without angina pectoris: Secondary | ICD-10-CM

## 2017-01-23 DIAGNOSIS — E785 Hyperlipidemia, unspecified: Secondary | ICD-10-CM

## 2017-01-23 DIAGNOSIS — D649 Anemia, unspecified: Secondary | ICD-10-CM

## 2017-01-23 DIAGNOSIS — Z8709 Personal history of other diseases of the respiratory system: Secondary | ICD-10-CM | POA: Diagnosis not present

## 2017-01-23 DIAGNOSIS — I5042 Chronic combined systolic (congestive) and diastolic (congestive) heart failure: Secondary | ICD-10-CM | POA: Diagnosis not present

## 2017-01-23 LAB — CBC
HEMATOCRIT: 37.6 % (ref 35.0–45.0)
Hemoglobin: 12.5 g/dL (ref 11.7–15.5)
MCH: 30.9 pg (ref 27.0–33.0)
MCHC: 33.2 g/dL (ref 32.0–36.0)
MCV: 92.8 fL (ref 80.0–100.0)
MPV: 8.8 fL (ref 7.5–12.5)
Platelets: 245 10*3/uL (ref 140–400)
RBC: 4.05 MIL/uL (ref 3.80–5.10)
RDW: 16.2 % — AB (ref 11.0–15.0)
WBC: 20 10*3/uL — AB (ref 3.8–10.8)

## 2017-01-23 LAB — CLOSTRIDIUM DIFFICILE BY PCR: Toxigenic C. Difficile by PCR: NOT DETECTED

## 2017-01-23 NOTE — Telephone Encounter (Signed)
[place] of care numbered and put in CMA's box.

## 2017-01-23 NOTE — Patient Instructions (Signed)
Medication Instructions:  Your physician recommends that you continue on your current medications as directed. Please refer to the Current Medication list given to you today.  Labwork: Your physician recommends that you return for lab work in: TODAY--CBC, BMP  Testing/Procedures: None   Follow-Up: Your physician recommends that you schedule a follow-up appointment in: 1-2 months with DR CRENSHAW  Any Other Special Instructions Will Be Listed Below (If Applicable).  If you need a refill on your cardiac medications before your next appointment, please call your pharmacy.

## 2017-01-23 NOTE — Patient Outreach (Signed)
[facility]) Care Management  01/23/2017  Linda Kelly Nov 05, 1927 [id number]  Subjective: "Client reports she continues with diarrhea".  Objective: none-telephonic call  Assessment: 83 year old with recent admission 3/8-3/14 for Heart failure, respiratory failure, healthcare acquired pneumonia. History of copd, MI, HTN, pulmonary fibrosis.  RNCM called re: Red flag EMMI new or worsening problem. Client reports she continues to have diarrhea. Linda Kelly states her daughter took a specimen over to primary care office on yesterday and they are awaiting results.  Client to follow up today with primary care by the end of the day if they do not receive a call.  Client encouraged to drink plenty of fluids and remain hydrated.  Plan: continue to follow.  Thea Silversmith, RN, MSN, [place] Coordinator Cell: [phone]

## 2017-01-23 NOTE — Progress Notes (Signed)
Cardiology Office Note    Date:  01/23/2017   ID:  Linda Kelly, DOB October 12, 1928, MRN [id number]  PCP:  Binnie Rail, MD  Cardiologist:  Dr. Stanford Breed  Chief Complaint  Patient presents with  . Hospitalization Follow-up    seen for Dr. Stanford Breed, recently admitted for PNA and CHF    History of Present Illness:  Linda Kelly is a 83 y.o. female with PMH of carotid artery disease, CAD, HLD, pulmonary fibrosis, CVA, and COPD. She suffered an MI in 1985 and was treated with balloon angioplasty. Renal Doppler obtained in 09/2011 showed stable 1-59% bilateral renal artery stenosis. She eventually underwent right carotid endarterectomy in May 2015. She was admitted with congestive heart failure in October 2017. Echocardiogram obtained at that time showed EF 40-45%, grade 2 [place] dysfunction, mild to moderate mitral regurg. Cardiac catheterization showed high-grade obstruction in the right iliac artery, multivessel coronary artery disease with occlusion of right coronary artery, occlusion of LAD, 99% distal left circumflex disease, a very tortuous segment, EF 35-45%. Medical therapy was recommended. Surgery was felt to be possible given her age and comorbidity. Chest CT in January 2018 showed interstitial lung disease. Atherosclerosis was noted in the thoracic aortic aneurysm measuring 4.2 cm.  Her last office visit was on Mar 05, 202018, she did complain of significant weakness and discomfort in her lower extremity for 2 weeks. Total CK was normal. She presented to the hospital on 01/03/2017 with significant shortness of breath. Her O2 saturation was in the 60s on 2.5 L nasal cannula. Initial troponin was 2.15. The elevated troponin was felt most likely due to demand ischemia, she was treated for acute or chronic combined systolic and diastolic heart failure. She was discharged home on 01/09/2017 with home health PT, OT, home health aide and nurse. She was also discharged on a three-week steroid taper.  Palliative care service has seen the patient during this admission, however she did not wish for hospice at this time.  She presents today for cartilage office visit. While in the waiting room, apparently she had several bouts of diarrhea. This has been ongoing for the past 5 days. She has a negative C. difficile panel, her stool cultures currently pending. Otherwise, she says her breathing has improved. She has been compliant with her diuretics since her discharge. Her weight today is 134 pounds. She feels very weak. Given her recent diarrhea, I am concerned that current dose of diuretic is going to dehydrate her further. I will obtain a basic metabolic panel. She has also been recently anemic as well, I will repeat a CBC as well. She denies any chest discomfort. She is currently on 30 mg dose of prednisone, this is part of a 3 weeks tapering dose. Recent lab work does show elevated white blood cell count which is likely related to the steroid.   Past Medical History:  Diagnosis Date  . Anxiety   . Anxiety and depression   . Arthritis   . Benign neoplasm of esophagus   . CAD (coronary artery disease)   . Cancer [facility])    Skin cancer on back  . Carotid artery disease ([place])   . CHF (congestive heart failure) ([place])   . Chronic gastritis   . Colon, diverticulosis   . COPD (chronic obstructive pulmonary disease) ([place])   . Depression   . GERD (gastroesophageal reflux disease)   . Hiatal hernia   . High frequency hearing loss   . Hyperlipidemia   . IBS (irritable  bowel syndrome)   . Idiopathic pulmonary fibrosis   . Internal hemorrhoid   . MI (myocardial infarction) 1985  . Other nonthrombocytopenic purpuras   . PONV (postoperative nausea and vomiting)   . PVD (peripheral vascular disease) ([place])   . Shortness of breath    with exertion  . Solitary pulmonary nodule   . Stroke [facility])    Hx: of several mini -strokes  . Tinnitus    chronic  . Vertigo     Past Surgical History:    Procedure Laterality Date  . ANGIOPLASTY  1997, 98, 99  . BREAST SURGERY     Hx; of biopsy  . CARDIAC CATHETERIZATION N/A 08/10/2016   Procedure: Left Heart Cath and Coronary Angiography;  Surgeon: Belva Crome, MD;  Location: [facility] [facility];  Service: Cardiovascular;  Laterality: N/A;  . CAROTID ENDARTERECTOMY     left side x 3, saphenous vein graft from left leg  . CATARACT EXTRACTION W/ INTRAOCULAR LENS  IMPLANT, BILATERAL    . COLON SURGERY    . COLONOSCOPY  11-27-01, 10-30-05, 05-24-11   diverticulosis, hemorrhoids  . DENTAL SURGERY     2012   . ENDARTERECTOMY Right 03/03/2014   Procedure: RIGHT CAROTID ENDARTERECTOMY WITH PATCH ANGIOPLASTY;  Surgeon: Rosetta Posner, MD;  Location: [place];  Service: Vascular;  Laterality: Right;  . FOOT SURGERY     bilateral  . SHOULDER SURGERY     left  . TOE SURGERY     right foot  . TONSILLECTOMY    . TUBAL LIGATION    . UPPER GASTROINTESTINAL ENDOSCOPY  11-27-01, 08-18-08   Hiatal hernia, benign esophagus neoplasia, gastritis, tortuous esophagus 43 Maloney dilation performed    Current Medications: Outpatient Medications Prior to Visit  Medication Sig Dispense Refill  . acetaminophen (TYLENOL) 650 MG CR tablet Take 650 mg by mouth daily.    Marland Kitchen ALPRAZolam (XANAX) 0.25 MG tablet Take 1 tablet (0.25 mg total) by mouth 3 (three) times daily as needed for anxiety. 30 tablet 0  . aspirin EC 81 MG tablet Take 81 mg by mouth at bedtime.     . cholecalciferol (VITAMIN D) 1000 UNITS tablet Take 1,000 Units by mouth at bedtime.     . clopidogrel (PLAVIX) 75 MG tablet Take 1 tablet (75 mg total) by mouth daily. -- Office visit needed for further refills 90 tablet 0  . furosemide (LASIX) 40 MG tablet Take 1 tablet (40 mg total) by mouth daily. 30 tablet 6  . ipratropium-albuterol (DUONEB) 0.5-2.5 (3) MG/3ML SOLN Take 3 mLs by nebulization every 4 (four) hours as needed. 360 mL 4  . montelukast (SINGULAIR) 10 MG tablet Take 1 tablet (10 mg total) by  mouth at bedtime. 90 tablet 3  . nitroGLYCERIN (NITROSTAT) 0.4 MG SL tablet Place 1 tablet (0.4 mg total) under the tongue every 5 (five) minutes x 3 doses as needed for chest pain. 25 tablet 3  . Omega-3 Fatty Acids (FISH OIL) 1000 MG CAPS Take 1,000 mg by mouth at bedtime.     . OXYGEN Inhale 2.5 L into the lungs continuous.    . potassium chloride (KLOR-CON 10) 10 MEQ tablet Take 0.5 tablets (5 mEq total) by mouth daily. 45 tablet 3  . predniSONE (DELTASONE) 10 MG tablet Prednisone dosing: Take prednisone 60 mg (6tabs) for 4 days. Then taper to 50 mg (5tabs) for 4 days, then 40 mg (4 tabs) for 4 days, then 30 mg (3 tabs) for 4 days, 20  mg (2 tabs) for 3 days, then 10 mg  (1 tabs) for 2 days, then OFF. 80 tablet 0  . ranitidine (ZANTAC) 150 MG tablet TAKE 1 TABLET BY MOUTH TWICE DAILY (Patient taking differently: Take 150 mg by mouth 2 (two) times daily. ) 180 tablet 3  . sertraline (ZOLOFT) 50 MG tablet Take 1 tablet (50 mg total) by mouth daily. 90 tablet 1  . simvastatin (ZOCOR) 40 MG tablet Take 1 tablet (40 mg total) by mouth daily. 90 tablet 3  . vitamin E 400 UNIT capsule Take 400 Units by mouth at bedtime.      No facility-administered medications prior to visit.      Allergies:   Patient has no known allergies.   Social History   Social History  . Marital status: Widowed    Spouse name: N/A  . Number of children: 6  . Years of education: N/A   Occupational History  . 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 Smoking status: Former Smoker    Packs/day: 0.50    Years: 60.00    Types: Cigarettes    Quit date: 02/26/2014  . Smokeless tobacco: Never Used  . Alcohol use No  . Drug use: No  . Sexual activity: No   Other Topics Concern  . 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   Pt does not get regular exercise      Family History:  The patient's family history includes Breast cancer in her maternal aunt; Cancer in her sister; Colitis in her mother and sister; Colon cancer in her father; Heart attack in her father and son; Heart disease in her father and mother; Hyperlipidemia in her brother, daughter, father, mother, sister, sister, and son; Hypertension in her daughter and son; Lung cancer in her paternal aunt; Other in her brother; Prostate cancer in her father; Stroke in her mother; Vasculitis in her son.   ROS:   Please see the history of present illness.    ROS All other systems reviewed and are negative.   PHYSICAL EXAM:   VS:  BP 113/79   Pulse 100   Ht 4\' 10"  (1.473 m)   Wt 134 lb (60.8 kg)   SpO2 100%   BMI 28.01 kg/m    GEN: Well nourished, well developed, in no acute distress  In wheel chair HEENT: normal  On [place] and oxygen Neck: no JVD, carotid bruits, or masses Cardiac: bibasilar fine crackle; no murmurs, rubs, or gallops,no edema  Respiratory:  clear to auscultation bilaterally, normal work of breathing GI: soft, nontender, nondistended, + BS MS: no deformity or atrophy  Skin: warm and dry, no rash Neuro:  Alert and Oriented x 3, Strength and sensation are intact Psych: euthymic mood, full affect  Wt Readings from Last 3 Encounters:  01/23/17 134 lb (60.8 kg)  01/18/17 137 lb (62.1 kg)  01/15/17 135 lb (61.2 kg)      Studies/Labs Reviewed:   EKG:  EKG is not ordered today.    Recent Labs: 02/28/2016: TSH 1.92 01/03/2017: B Natriuretic Peptide 960.4 01/08/2017: ALT 9 01/09/2017: BUN 46; Creatinine, Ser 1.04; Hemoglobin 9.6; Platelets 335; Potassium 4.1; Sodium 136   Lipid Panel    Component Value Date/Time   CHOL 144 09/25/2016 0929   TRIG 204 (H) 09/25/2016  4709   HDL 32 (L) 09/25/2016 0929   CHOLHDL 4.5 09/25/2016 0929   VLDL 41 (H) 09/25/2016 0929   LDLCALC 71 09/25/2016 0929    Additional studies/ records that were reviewed today include:     Echocardiogram: 08/10/2016 Study Conclusions  - Left ventricle: The cavity size was normal. Wall thickness was normal. Systolic function was mildly to moderately reduced. The estimated ejection fraction was in the range of 40% to 45%. Moderate hypokinesis of the basal-midanterolateral and inferolateral myocardium; consistent with ischemia in the distribution of the left circumflex coronary artery. Features are consistent with a pseudonormal left ventricular filling pattern, with concomitant abnormal relaxation and increased filling pressure (grade 2 diastolic dysfunction). - Mitral valve: Moderately calcified annulus. There was mild to moderate regurgitation directed centrally. - Left atrium: The atrium was mildly dilated   Cardiac Catheterization: 62/83/6629  Complicated procedure that demonstrated high-grade obstruction in the right iliac arterypreventing the procedure from being completed and requiring crossover to the left femoral artery.  Inability to advance the pulmonary artery catheter into the pulmonary artery or capillary wedge position. RV systolic pressure is mildly to moderately elevated.  Severe multivessel coronary disease with chronic total occlusion of the mid RCAcollateralized by right to right and left-to-right sources.  Total occlusion of the mid LAD beyond a large first diagonal. Left left collaterals are noted.  99% stenosis in the mid to distal native circumflex beyond a very tortuous segment. PCI was not attempted. Distal territory is relatively small.  Inferior wall severe hypokinesis involving the basal to mid segment. Anterior and apical mild hypokinesis is noted. Ejection fraction is estimated to be 35-45%.Presentation was that ofacute on chronic combined systolic and diastolic heart failure.  RECOMMENDATIONS:   Medical therapy with optimization of heart failure regimen.  Antiplatelet therapy.  Given the patient's age,  I do not believe surgical revascularization is warranted/prudent.  Only PCI target would be the mid circumflex, which I do not believe would change her clinical status (not having ischemic pain or evidence of ongoing infarction). There would be relatively high risk of inability to deliver stent and/or primary failure due to tortuosity.    ASSESSMENT:    1. Chronic combined systolic and diastolic heart failure ([place])   2. Anemia, unspecified type   3. Coronary artery disease involving native coronary artery of native heart without angina pectoris   4. Hyperlipidemia, unspecified hyperlipidemia type   5. H/O pulmonary fibrosis      PLAN:  In order of problems listed above:  1. Chronic combined systolic and diastolic heart failure: Despite the fact patient has very fine bibasilar crackle, she does not appears to be volume overloaded on physical exam. I think the bibasilar crackle is consistent with her history of pulmonary fibrosis. Not have significant JVD or lower extremity edema. I am more concerned about dehydration on the current dose of diuretic with her persistent diarrhea for the past 5 days. I will check a basic metabolic panel to make sure her electrolyte and renal function stable.  2. Anemia: Persistent anemia since at least 2017, hemoglobin ranged from 9-11. Recent laboratory work also shows severely elevated white blood cell count, this is after the steroid therapy. Will obtain CBC today. Expect the white blood cell count to remain elevated.  3. Coronary artery disease: She has known severe coronary artery disease not amenable for bypass surgery. Medical therapy is currently recommended. She has no obvious angina. Continue Plavix  4. Hyperlipidemia: On Zocor 40 mg daily.  5.  h/o pulmonary fibrosis: Currently on 3 L home oxygen.    Medication Adjustments/Labs and Tests Ordered: Current medicines are reviewed at length with the patient today.  Concerns regarding medicines are  outlined above.  Medication changes, Labs and Tests ordered today are listed in the Patient Instructions below. Patient Instructions  Medication Instructions:  Your physician recommends that you continue on your current medications as directed. Please refer to the Current Medication list given to you today.  Labwork: Your physician recommends that you return for lab work in: TODAY--CBC, BMP  Testing/Procedures: None   Follow-Up: Your physician recommends that you schedule a follow-up appointment in: 1-2 months with DR CRENSHAW  Any Other Special Instructions Will Be Listed Below (If Applicable).  If you need a refill on your cardiac medications before your next appointment, please call your pharmacy.      Hilbert Corrigan, [place]  01/23/2017 5:42 PM    [facility] [address], [place], [place]  [address] Phone: [phone]; Fax: [phone]

## 2017-01-24 LAB — BASIC METABOLIC PANEL
BUN: 24 mg/dL (ref 7–25)
CALCIUM: 8.8 mg/dL (ref 8.6–10.4)
CHLORIDE: 94 mmol/L — AB (ref 98–110)
CO2: 30 mmol/L (ref 20–31)
CREATININE: 1.27 mg/dL — AB (ref 0.60–0.88)
GLUCOSE: 105 mg/dL — AB (ref 65–99)
Potassium: 4.5 mmol/L (ref 3.5–5.3)
Sodium: 136 mmol/L (ref 135–146)

## 2017-01-26 LAB — STOOL CULTURE

## 2017-01-28 ENCOUNTER — Other Ambulatory Visit: Payer: Self-pay

## 2017-01-28 DIAGNOSIS — R739 Hyperglycemia, unspecified: Secondary | ICD-10-CM

## 2017-01-28 DIAGNOSIS — I5042 Chronic combined systolic (congestive) and diastolic (congestive) heart failure: Secondary | ICD-10-CM

## 2017-01-28 DIAGNOSIS — J189 Pneumonia, unspecified organism: Secondary | ICD-10-CM

## 2017-01-28 DIAGNOSIS — J9621 Acute and chronic respiratory failure with hypoxia: Secondary | ICD-10-CM

## 2017-01-28 DIAGNOSIS — I214 Non-ST elevation (NSTEMI) myocardial infarction: Secondary | ICD-10-CM

## 2017-01-28 DIAGNOSIS — I110 Hypertensive heart disease with heart failure: Secondary | ICD-10-CM

## 2017-01-28 DIAGNOSIS — J8410 Pulmonary fibrosis, unspecified: Secondary | ICD-10-CM | POA: Diagnosis not present

## 2017-01-28 DIAGNOSIS — F419 Anxiety disorder, unspecified: Secondary | ICD-10-CM | POA: Diagnosis not present

## 2017-01-28 DIAGNOSIS — J440 Chronic obstructive pulmonary disease with acute lower respiratory infection: Secondary | ICD-10-CM

## 2017-01-28 DIAGNOSIS — D649 Anemia, unspecified: Secondary | ICD-10-CM

## 2017-01-28 DIAGNOSIS — E785 Hyperlipidemia, unspecified: Secondary | ICD-10-CM | POA: Diagnosis not present

## 2017-01-28 NOTE — Patient Instructions (Signed)

## 2017-01-28 NOTE — Patient Outreach (Signed)
[facility]) Care Management  [place]  01/28/2017   Linda Kelly 1928/10/20 [id number]  Subjective: client reports diarrhea resolved.  Objective: none-telephonic assessment.  Encounter Medications:  Outpatient Encounter Prescriptions as of 01/28/2017  Medication Sig Note  . acetaminophen (TYLENOL) 650 MG CR tablet Take 650 mg by mouth daily.   Marland Kitchen ALPRAZolam (XANAX) 0.25 MG tablet Take 1 tablet (0.25 mg total) by mouth 3 (three) times daily as needed for anxiety.   Marland Kitchen aspirin EC 81 MG tablet Take 81 mg by mouth at bedtime.  01/03/2017: Pt took 324mg  this am  . cholecalciferol (VITAMIN D) 1000 UNITS tablet Take 1,000 Units by mouth at bedtime.  01/15/2017: Taking 2000IU  . clopidogrel (PLAVIX) 75 MG tablet Take 1 tablet (75 mg total) by mouth daily. -- Office visit needed for further refills   . furosemide (LASIX) 40 MG tablet Take 1 tablet (40 mg total) by mouth daily.   Marland Kitchen ipratropium-albuterol (DUONEB) 0.5-2.5 (3) MG/3ML SOLN Take 3 mLs by nebulization every 4 (four) hours as needed.   . montelukast (SINGULAIR) 10 MG tablet Take 1 tablet (10 mg total) by mouth at bedtime.   . nitroGLYCERIN (NITROSTAT) 0.4 MG SL tablet Place 1 tablet (0.4 mg total) under the tongue every 5 (five) minutes x 3 doses as needed for chest pain.   . Omega-3 Fatty Acids (FISH OIL) 1000 MG CAPS Take 1,000 mg by mouth at bedtime.  01/15/2017: Taking 500mg  EPA RxOmega-3  . OXYGEN Inhale 2.5 L into the lungs continuous.   . potassium chloride (KLOR-CON 10) 10 MEQ tablet Take 0.5 tablets (5 mEq total) by mouth daily.   . predniSONE (DELTASONE) 10 MG tablet Prednisone dosing: Take prednisone 60 mg (6tabs) for 4 days. Then taper to 50 mg (5tabs) for 4 days, then 40 mg (4 tabs) for 4 days, then 30 mg (3 tabs) for 4 days, 20 mg (2 tabs) for 3 days, then 10 mg  (1 tabs) for 2 days, then OFF.   . ranitidine (ZANTAC) 150 MG tablet TAKE 1 TABLET BY MOUTH TWICE DAILY (Patient taking differently: Take 150 mg  by mouth 2 (two) times daily. )   . sertraline (ZOLOFT) 50 MG tablet Take 1 tablet (50 mg total) by mouth daily.   . simvastatin (ZOCOR) 40 MG tablet Take 1 tablet (40 mg total) by mouth daily.   . vitamin E 400 UNIT capsule Take 400 Units by mouth at bedtime.     No facility-administered encounter medications on file as of 01/28/2017.     Functional Status:  In your present state of health, do you have any difficulty performing the following activities: 01/15/2017 01/03/2017  Hearing? [name]  Vision? Y Y  Difficulty concentrating or making decisions? N N  Walking or climbing stairs? Y N  Dressing or bathing? Y Y  Doing errands, shopping? Y N  Preparing Food and eating ? Y -  Using the Toilet? N -  In the past six months, have you accidently leaked urine? Y -  Do you have problems with loss of bowel control? N -  Managing your Medications? N -  Managing your Finances? N -  Housekeeping or managing your Housekeeping? N -  Some recent data might be hidden    Fall/Depression Screening: PHQ 2/9 Scores 01/15/2017 03/27/2016 02/21/2016 12/28/2013  PHQ - 2 Score 0 0 1 0   Fall Risk  01/15/2017 03/27/2016 02/21/2016 12/28/2013  Falls in the past year? No Yes Yes No  Number falls in past yr: - 1 1 -  Injury with Fall? - Yes No -  Risk Factor Category  - High Fall Risk - -  Risk for fall due to : - History of fall(s) Impaired balance/gait -  Follow up - - Falls evaluation completed -   Assessment: 83 year old with recent admission 3/8-3/14 for Heart failure, respiratory failure, healthcare acquired pneumonia. History of copd, MI, HTN, pulmonary fibrosis.  RNCM called for transition of care. Client reports her diarrhea is resolved. Client reports eating good yesterday and reports a weight gain of 3lbs. Heart failure zone tool reviewed. RNCM reinforced the need to monitor for increased weight gain. Client voiced understanding. Client without any questions or concerns at this time.  Plan: continue to  follow. Transition of care call next week.  Thea Silversmith, RN, MSN, [place] Coordinator Cell: [phone]

## 2017-01-29 ENCOUNTER — Other Ambulatory Visit: Payer: Self-pay

## 2017-01-29 NOTE — Patient Outreach (Signed)
[facility]) Care Management  01/29/2017  GREENLEE ANCHETA [birth year] [id number]   Patient triggered Red on EMMI heart failure dashboard, notification sent to Thea Silversmith, RN

## 2017-01-29 NOTE — Patient Outreach (Signed)
[facility]) Care Management  01/29/2017  CHRYS LANDGREBE [birth year] [id number]   RNCM received notification of EMMI red flag weight 136 pounds on yesterday. RNCM completed transition of care call on yesterday and weight and Heart failure management discussed with client on yesterday, therefore, RNCM will not call today. See note dated 01/28/17.  Plan: transition of care call next week.  Thea Silversmith, RN, MSN, [place] Coordinator Cell: [phone]

## 2017-02-01 ENCOUNTER — Other Ambulatory Visit: Payer: Self-pay

## 2017-02-01 NOTE — Patient Outreach (Signed)
Patient triggered Red on [place] Heart Failure Dashboard, notification sent to:  Davina Green, RN 

## 2017-02-01 NOTE — Patient Outreach (Signed)
[facility]) Care Management  02/01/2017  Linda Kelly 01-07-1928 [id number]   Telephone call to patient for EMMI-Heart Failure Dashboard Red-weight.  No answer.  States voicemail not set up.  Unable to leave a voice mail.    Plan: RN Health Coach will notify primary nurse Thea Silversmith of EMMI red.  Jone Baseman, RN, MSN [place] [phone]

## 2017-02-07 ENCOUNTER — Encounter: Payer: Self-pay | Admitting: Adult Health

## 2017-02-07 ENCOUNTER — Ambulatory Visit (INDEPENDENT_AMBULATORY_CARE_PROVIDER_SITE_OTHER): Payer: Commercial Managed Care - PPO | Admitting: Adult Health

## 2017-02-07 ENCOUNTER — Other Ambulatory Visit: Payer: Self-pay

## 2017-02-07 ENCOUNTER — Telehealth: Payer: Self-pay | Admitting: General Practice

## 2017-02-07 ENCOUNTER — Ambulatory Visit (INDEPENDENT_AMBULATORY_CARE_PROVIDER_SITE_OTHER)
Admission: RE | Admit: 2017-02-07 | Discharge: 2017-02-07 | Disposition: A | Discharge status: Home / Self Care | Payer: Commercial Managed Care - PPO | Source: Ambulatory Visit | Attending: Adult Health | Admitting: Adult Health

## 2017-02-07 VITALS — BP 134/84 | HR 88 | Ht <= 58 in | Wt 139.0 lb

## 2017-02-07 DIAGNOSIS — J189 Pneumonia, unspecified organism: Secondary | ICD-10-CM

## 2017-02-07 DIAGNOSIS — J9611 Chronic respiratory failure with hypoxia: Secondary | ICD-10-CM | POA: Diagnosis not present

## 2017-02-07 DIAGNOSIS — J8410 Pulmonary fibrosis, unspecified: Secondary | ICD-10-CM | POA: Diagnosis not present

## 2017-02-07 NOTE — Assessment & Plan Note (Addendum)
Recent flare w/ acute Pneumonitis -appears improved clinically  O2 demands  Are decreased  Check chest xray

## 2017-02-07 NOTE — Patient Instructions (Addendum)
Continue on Duoneb 3-4 times daily  Chest xray today .  Decrease Oxygen 2l/m .  Saline nasal rinses and gel As needed   Follow up Dr. Annamaria Boots  In 2-3 months and As needed   Please contact office for sooner follow up if symptoms do not improve or worsen or seek emergency care

## 2017-02-07 NOTE — Telephone Encounter (Signed)
I called the patient to confirm her PCP since Dr. Linna Darner retired.  There was no answer, and the voicemail hadn't been set up yet. Duane Lope ([place])

## 2017-02-07 NOTE — Progress Notes (Signed)
@Patient  ID: Linda Kelly, female    DOB: [birth year], 83 y.o.   MRN: [id number]  Chief Complaint  Patient presents with  . Follow-up    COPD     Referring provider: Binnie Rail, MD  HPI: 83 year old female, smoker followed for pulmonary fibrosis, COPD, chronic hypoxic respiratory failure on oxygen. [place] dust exposure  Has CHF (Echo 07/2016 EF 40-45%, gr 2 DD )   02/07/2017 Follow up : Post hospital follow up  Patient returns for a post hospital follow-up. Patient was admitted 1 month ago with acute on chronic congestive heart failure. She was felt to have acute pneumonitis with bronchitis and possible pneumonia. She was treated with aggressive diuresis, IV antibiotics, and steroids. Since discharge. Patient says that her breathing has improved. She's not as short of breath. She's not had increased leg swelling. She does get winded with prolonged walking. She remains on oxygen at 3 L. She does complains that her nose is irritated and occasionally has a nosebleed. He denies any nausea, vomiting, chest pain, orthopnea, PND or hemoptysis   No Known Allergies  Immunization History  Administered Date(s) Administered  . Influenza Split 07/30/2011, 07/29/2012, 08/05/2013  . Influenza Whole 10/02/2007, 08/04/2009, 07/29/2010  . Influenza, High Dose Seasonal PF 08/01/2016  . Influenza,inj,Quad PF,36+ Mos 06/29/2014, 07/14/2015  . Pneumococcal Conjugate-13 07/14/2015  . Pneumococcal Polysaccharide-23 10/29/2002, 08/06/2013  . Zoster 06/29/2013    Past Medical History:  Diagnosis Date  . Anxiety   . Anxiety and depression   . Arthritis   . Benign neoplasm of esophagus   . CAD (coronary artery disease)   . Cancer [facility])    Skin cancer on back  . Carotid artery disease ([place])   . CHF (congestive heart failure) ([place])   . Chronic gastritis   . Colon, diverticulosis   . COPD (chronic obstructive pulmonary disease) ([place])   . Depression   . GERD  (gastroesophageal reflux disease)   . Hiatal hernia   . High frequency hearing loss   . Hyperlipidemia   . IBS (irritable bowel syndrome)   . Idiopathic pulmonary fibrosis   . Internal hemorrhoid   . MI (myocardial infarction) 1985  . Other nonthrombocytopenic purpuras   . PONV (postoperative nausea and vomiting)   . PVD (peripheral vascular disease) ([place])   . Shortness of breath    with exertion  . Solitary pulmonary nodule   . Stroke [facility])    Hx: of several mini -strokes  . Tinnitus    chronic  . Vertigo     Tobacco History: History  Smoking Status  . Former Smoker  . Packs/day: 0.50  . Years: 60.00  . Types: Cigarettes  . Quit date: 02/26/2014  Smokeless Tobacco  . Never Used   Counseling given: Not Answered   Outpatient Encounter Prescriptions as of 02/07/2017  Medication Sig  . acetaminophen (TYLENOL) 650 MG CR tablet Take 650 mg by mouth daily.  Marland Kitchen ALPRAZolam (XANAX) 0.25 MG tablet Take 1 tablet (0.25 mg total) by mouth 3 (three) times daily as needed for anxiety.  Marland Kitchen aspirin EC 81 MG tablet Take 81 mg by mouth at bedtime.   . cholecalciferol (VITAMIN D) 1000 UNITS tablet Take 1,000 Units by mouth at bedtime.   . clopidogrel (PLAVIX) 75 MG tablet Take 1 tablet (75 mg total) by mouth daily. -- Office visit needed for further refills  . furosemide (LASIX) 40 MG tablet Take 1 tablet (40 mg total) by mouth daily.  Marland Kitchen  ipratropium-albuterol (DUONEB) 0.5-2.5 (3) MG/3ML SOLN Take 3 mLs by nebulization every 4 (four) hours as needed.  . montelukast (SINGULAIR) 10 MG tablet Take 1 tablet (10 mg total) by mouth at bedtime.  . nitroGLYCERIN (NITROSTAT) 0.4 MG SL tablet Place 1 tablet (0.4 mg total) under the tongue every 5 (five) minutes x 3 doses as needed for chest pain.  . Omega-3 Fatty Acids (FISH OIL) 1000 MG CAPS Take 1,000 mg by mouth at bedtime.   . OXYGEN Inhale 2.5 L into the lungs continuous.  . potassium chloride (KLOR-CON 10) 10 MEQ tablet Take 0.5 tablets (5 mEq  total) by mouth daily.  . ranitidine (ZANTAC) 150 MG tablet TAKE 1 TABLET BY MOUTH TWICE DAILY (Patient taking differently: Take 150 mg by mouth 2 (two) times daily. )  . sertraline (ZOLOFT) 50 MG tablet Take 1 tablet (50 mg total) by mouth daily.  . simvastatin (ZOCOR) 40 MG tablet Take 1 tablet (40 mg total) by mouth daily.  . vitamin E 400 UNIT capsule Take 400 Units by mouth at bedtime.   . [DISCONTINUED] predniSONE (DELTASONE) 10 MG tablet Prednisone dosing: Take prednisone 60 mg (6tabs) for 4 days. Then taper to 50 mg (5tabs) for 4 days, then 40 mg (4 tabs) for 4 days, then 30 mg (3 tabs) for 4 days, 20 mg (2 tabs) for 3 days, then 10 mg  (1 tabs) for 2 days, then OFF. (Patient not taking: Reported on 02/07/2017)   No facility-administered encounter medications on file as of 02/07/2017.      Review of Systems  Constitutional:   No  weight loss, night sweats,  Fevers, chills, fatigue, or  lassitude.  HEENT:   No headaches,  Difficulty swallowing,  Tooth/dental problems, or  Sore throat,                No sneezing, itching, ear ache,  +nasal congestion, post nasal drip,   CV:  No chest pain,  Orthopnea, PND, swelling in lower extremities, anasarca, dizziness, palpitations, syncope.   GI  No heartburn, indigestion, abdominal pain, nausea, vomiting, diarrhea, change in bowel habits, loss of appetite, bloody stools.   Resp:    No chest wall deformity  Skin: no rash or lesions.  GU: no dysuria, change in color of urine, no urgency or frequency.  No flank pain, no hematuria   MS:  No joint pain or swelling.  No decreased range of motion.  No back pain.    Physical Exam  BP 134/84 (BP Location: Right Arm, Cuff Size: Normal)   Pulse 88   Ht 4\' 10"  (1.473 m)   Wt 139 lb (63 kg)   SpO2 100%   BMI 29.05 kg/m   GEN: A/Ox3; pleasant , NAD, elderly on O2    HEENT:  [place]/AT,  EACs-clear, TMs-wnl, NOSE-clear drainage , left nostril w/ crusted blood. THROAT-clear, no lesions, no  postnasal drip or exudate noted.   NECK:  Supple w/ fair ROM; no JVD; normal carotid impulses w/o bruits; no thyromegaly or nodules palpated; no lymphadenopathy.    RESP  Clear  P & A; w/o, wheezes/ rales/ or rhonchi. no accessory muscle use, no dullness to percussion  CARD:  RRR, no m/r/g, no peripheral edema, pulses intact, no cyanosis or clubbing.  GI:   Soft & nt; nml bowel sounds; no organomegaly or masses detected.   Musco: Warm bil, no deformities or joint swelling noted.   Neuro: alert, no focal deficits noted.    Skin: Warm,  no lesions or rashes    Assessment & Plan:   Chronic respiratory failure (HCC) O2 sats 99% on 2l/m  May decreased from 3/m to 2l/m   HCAP (healthcare-associated pneumonia) Recent hospitalization with pnuemonitis /COPD exacerbation  Clinically improving  Check cxr   Pulmonary fibrosis (HCC) Recent flare w/ acute Pneumonitis -appears improved clinically  O2 demands  Are decreased  Check chest xray       Rexene Edison, NP 02/07/2017

## 2017-02-07 NOTE — Assessment & Plan Note (Signed)
O2 sats 99% on 2l/m  May decreased from 3/m to 2l/m

## 2017-02-07 NOTE — Patient Outreach (Signed)
[place] [facility]) Care Management  02/07/2017  HEILY CARLUCCI [birth year] [id number]   Subjective: none  Objective: none  Assessment: 83 year old with recent admission 3/8-3/14 for Heart failure, respiratory failure, healthcare acquired pneumonia. History of copd, MI, HTN, pulmonary fibrosis.  RNCM called for transition of care. No answer. Unable to leave message.  Noted that Client saw pulmonary provider today.  Plan: RNCM will follow up next week.  Thea Silversmith, RN, MSN, [place] Coordinator Cell: [phone]

## 2017-02-07 NOTE — Assessment & Plan Note (Signed)
Recent hospitalization with pnuemonitis /COPD exacerbation  Clinically improving  Check cxr

## 2017-02-11 ENCOUNTER — Inpatient Hospital Stay: Payer: Self-pay | Admitting: Internal Medicine

## 2017-02-11 ENCOUNTER — Other Ambulatory Visit: Payer: Self-pay

## 2017-02-11 NOTE — Patient Outreach (Signed)
[place] [facility]) Care Management  [place]  02/11/2017   Linda Kelly [birth year] [id number]  Subjective: client reports her activity level is about the same. Client reports breathing is better.  Objective: none-telephonic call.  Encounter Medications:  Outpatient Encounter Prescriptions as of 02/11/2017  Medication Sig Note  . acetaminophen (TYLENOL) 650 MG CR tablet Take 650 mg by mouth daily.   Marland Kitchen ALPRAZolam (XANAX) 0.25 MG tablet Take 1 tablet (0.25 mg total) by mouth 3 (three) times daily as needed for anxiety.   Marland Kitchen aspirin EC 81 MG tablet Take 81 mg by mouth at bedtime.    . cholecalciferol (VITAMIN D) 1000 UNITS tablet Take 1,000 Units by mouth at bedtime.  01/15/2017: Taking 2000IU  . clopidogrel (PLAVIX) 75 MG tablet Take 1 tablet (75 mg total) by mouth daily. -- Office visit needed for further refills   . furosemide (LASIX) 40 MG tablet Take 1 tablet (40 mg total) by mouth daily.   Marland Kitchen ipratropium-albuterol (DUONEB) 0.5-2.5 (3) MG/3ML SOLN Take 3 mLs by nebulization every 4 (four) hours as needed.   . montelukast (SINGULAIR) 10 MG tablet Take 1 tablet (10 mg total) by mouth at bedtime.   . nitroGLYCERIN (NITROSTAT) 0.4 MG SL tablet Place 1 tablet (0.4 mg total) under the tongue every 5 (five) minutes x 3 doses as needed for chest pain.   . Omega-3 Fatty Acids (FISH OIL) 1000 MG CAPS Take 1,000 mg by mouth at bedtime.  01/15/2017: Taking 500mg  EPA RxOmega-3  . OXYGEN Inhale 2.5 L into the lungs continuous.   . potassium chloride (KLOR-CON 10) 10 MEQ tablet Take 0.5 tablets (5 mEq total) by mouth daily.   . ranitidine (ZANTAC) 150 MG tablet TAKE 1 TABLET BY MOUTH TWICE DAILY (Patient taking differently: Take 150 mg by mouth 2 (two) times daily. )   . sertraline (ZOLOFT) 50 MG tablet Take 1 tablet (50 mg total) by mouth daily.   . simvastatin (ZOCOR) 40 MG tablet Take 1 tablet (40 mg total) by mouth daily.   . vitamin E 400 UNIT capsule Take 400 Units by mouth at  bedtime.     No facility-administered encounter medications on file as of 02/11/2017.     Functional Status:  In your present state of health, do you have any difficulty performing the following activities: 01/15/2017 01/03/2017  Hearing? [name]  Vision? Y Y  Difficulty concentrating or making decisions? N N  Walking or climbing stairs? Y N  Dressing or bathing? Y Y  Doing errands, shopping? Y N  Preparing Food and eating ? Y -  Using the Toilet? N -  In the past six months, have you accidently leaked urine? Y -  Do you have problems with loss of bowel control? N -  Managing your Medications? N -  Managing your Finances? N -  Housekeeping or managing your Housekeeping? N -  Some recent data might be hidden    Fall/Depression Screening: PHQ 2/9 Scores 01/15/2017 03/27/2016 02/21/2016 12/28/2013  PHQ - 2 Score 0 0 1 0   Assessment: 83 year old with recent admission 3/8-3/14 for Heart failure, respiratory failure, healthcare acquired pneumonia. History of copd, MI, HTN, pulmonary fibrosis.  RNCM called for transition of care. Client reports she has followed up with primary care and pulmonary provider as scheduled. Client reports oxygen level was decreased by pulmonary provider. Client states she continues to take her medications as ordered.  No new issues or concerns at this time.  Plan: home visit next month.  Thea Silversmith, RN, MSN, [place] Coordinator Cell: [phone]

## 2017-02-12 NOTE — Progress Notes (Signed)
Called spoke with patient, advised of cxr results / recs as stated by TP.  Pt verbalized her understanding and denied any questions. 

## 2017-02-13 ENCOUNTER — Other Ambulatory Visit: Payer: Self-pay

## 2017-02-13 NOTE — Patient Outreach (Signed)
[place] [facility]) Care Management  02/13/2017  PAHOUA SCHREINER 1928/01/08 [id number]   Subjective: Reports she was having pain in the left arm from the shoulder to the elbow and sometimes all the way down to her hand.  Objective: none  Assessment: 83 year old with recent admission for heart failure, respiratory failure, HCAP 3/8-3/14. Client  Reports first noticed these type symptoms about two years ago and was seen by Dr. Donnetta Hutching. Client states symptoms intermittant a couple times/day. She reports Tyelenol helps relieve the pain after an hour or so. Denies pain or any of these symptoms now. RNCM encouraged client to follow up with primary care to notify. Client states she is going to call primary care office now.  Plan: follow up within the next 1-2 weeks.  Thea Silversmith, RN, MSN, [place] Coordinator Cell: [phone]

## 2017-02-15 ENCOUNTER — Other Ambulatory Visit: Payer: Self-pay

## 2017-02-15 NOTE — Patient Outreach (Addendum)
[place] [facility]) Care Management  02/15/2017  KAREL MOWERS 06/17/1928 [id number]  Subjective: "I am doing ok today". My daughter is taking care of me and she is very well aware of all this.  Objective: none  Assessment: 83 year old with recent admission 3/8-3/14 for Heart failure, respiratory failure, healthcare acquired pneumonia. History of copd, MI, HTN, pulmonary fibrosis.  RNCM called to follow up on EMMI red flag: did not weigh self yesterday. Mrs. Bautch reports that she weighed herself, but the system did not understand what she was saying. She reports her weight is up one pound, but has been eating desserts. Denies any swelling, denies any increased shortness of breath.   Client reports she followed up with primary care regarding pain to left arm. Denies pain at this time. Client reports she has an appointment on Monday with primary care.  RNCM encouraged client to call 24 hour nurse advice line as needed. RNCM encouraged client to seek provider assistance if symptoms return/worsen.  Plan: continue to follow. Home visit within the next two weeks.  Thea Silversmith, RN, MSN, [place] Coordinator Cell: [phone]

## 2017-02-15 NOTE — Patient Outreach (Signed)
Patient triggered Red on EMMI Heart Failure Dashboard.  Notification sent to Thea Silversmith, RN.

## 2017-02-17 NOTE — Progress Notes (Signed)
Subjective:    Patient ID: Linda Kelly, female    DOB: 05/23/1928, 83 y.o.   MRN: [id number]  HPI The patient is here for an acute visit for left arm pain.  It started two weeks ago.    Left arm pain from shoulder to elbow, sometimes to hand:  Nothing helps with the pain.  She has tried heat.  The pain is a feeling like something has gone to sleep.  It tinglings and feels numb.  It reminds her of when her carotid artery was clogged in the past.  It often occurs in the evening when watching TV, sometimes it occurs during the day.  It is not related to activity or position. It gives her a SOB.  She has tried xanax - and it did help.  She did not try NTG.  It has not gotten worse since it started.  She is sedentary due to her SOB.  The pain can last a couple of hours.    She denies chest pain, palpitations and edema.  She has frequent lightheadedness.       Medications and allergies reviewed with patient and updated if appropriate.  Patient Active Problem List   Diagnosis Date Noted  . Steroid-induced hyperglycemia 01/08/2017  . Normocytic anemia 01/08/2017  . Leukocytosis 01/08/2017  . Anxiety state 01/08/2017  . HCAP (healthcare-associated pneumonia) 01/08/2017  . Increased oxygen demand   . Palliative care by specialist   . Encounter for hospice care discussion   . Palliative care encounter   . Goals of care, counseling/discussion   . Acute on chronic respiratory failure with hypoxia ([place]) 01/03/2017  . Demand ischemia ([place])   . Elevated troponin   . Chronic respiratory failure ([place]) 11/29/2016  . Episodic lightheadedness 08/28/2016  . Sleep difficulties 08/28/2016  . Acute on chronic combined systolic and diastolic CHF (congestive heart failure) ([place])   . Pulmonary fibrosis ([place])   . Non-ST elevation (NSTEMI) myocardial infarction ([place]) 08/08/2016  . Lumbar radiculopathy 08/01/2016  . Left-sided low back pain without sciatica 03/28/2016  . Cephalalgia 03/10/2016  .  Overactive bladder 02/21/2016  . Poor balance 02/21/2016  . Chronic lower back pain 02/21/2016  . Carotid stenosis-s/p CEA 2015 03/03/2014  . Aortic atherosclerosis ([place]) 02/04/2014  . Change in bowel habits 05/22/2011  . URINARY INCONTINENCE 12/22/2010  . ABDOMINAL BRUIT 08/10/2010  . FASTING HYPERGLYCEMIA 11/15/2009  . Depression 08/01/2009  . GERD 08/01/2009  . IRRITABLE BOWEL SYNDROME 12/27/2008  . TINNITUS, CHRONIC 11/05/2008  . HEARING LOSS, HIGH FREQUENCY 11/05/2008  . HIATAL HERNIA 08/11/2008  . OTHER NONTHROMBOCYTOPENIC PURPURAS 06/08/2008  . ORTHOSTATIC HYPOTENSION 06/08/2008  . Lung nodule 11/20/2007  . OBESITY, MILD 11/19/2007  . Hyperlipidemia 10/09/2007  . Tobacco abuse, in remission 10/09/2007  . Essential hypertension 10/09/2007  . CAD S/P percutaneous coronary angioplasty 1985 10/09/2007  . PVD (peripheral vascular disease) ([place]) 10/09/2007  . COPD mixed type ([place]) 10/09/2007  . VERTIGO 03/31/2007  . Headache 03/31/2007    Current Outpatient Prescriptions on File Prior to Visit  Medication Sig Dispense Refill  . acetaminophen (TYLENOL) 650 MG CR tablet Take 650 mg by mouth daily.    Marland Kitchen ALPRAZolam (XANAX) 0.25 MG tablet Take 1 tablet (0.25 mg total) by mouth 3 (three) times daily as needed for anxiety. 30 tablet 0  . aspirin EC 81 MG tablet Take 81 mg by mouth at bedtime.     . cholecalciferol (VITAMIN D) 1000 UNITS tablet Take 1,000 Units by mouth at  bedtime.     . clopidogrel (PLAVIX) 75 MG tablet Take 1 tablet (75 mg total) by mouth daily. -- Office visit needed for further refills 90 tablet 0  . furosemide (LASIX) 40 MG tablet Take 1 tablet (40 mg total) by mouth daily. 30 tablet 6  . ipratropium-albuterol (DUONEB) 0.5-2.5 (3) MG/3ML SOLN Take 3 mLs by nebulization every 4 (four) hours as needed. 360 mL 4  . montelukast (SINGULAIR) 10 MG tablet Take 1 tablet (10 mg total) by mouth at bedtime. 90 tablet 3  . nitroGLYCERIN (NITROSTAT) 0.4 MG SL tablet Place 1  tablet (0.4 mg total) under the tongue every 5 (five) minutes x 3 doses as needed for chest pain. 25 tablet 3  . Omega-3 Fatty Acids (FISH OIL) 1000 MG CAPS Take 1,000 mg by mouth at bedtime.     . OXYGEN Inhale 2.5 L into the lungs continuous.    . potassium chloride (KLOR-CON 10) 10 MEQ tablet Take 0.5 tablets (5 mEq total) by mouth daily. 45 tablet 3  . ranitidine (ZANTAC) 150 MG tablet TAKE 1 TABLET BY MOUTH TWICE DAILY (Patient taking differently: Take 150 mg by mouth 2 (two) times daily. ) 180 tablet 3  . sertraline (ZOLOFT) 50 MG tablet Take 1 tablet (50 mg total) by mouth daily. 90 tablet 1  . simvastatin (ZOCOR) 40 MG tablet Take 1 tablet (40 mg total) by mouth daily. 90 tablet 3  . vitamin E 400 UNIT capsule Take 400 Units by mouth at bedtime.      No current facility-administered medications on file prior to visit.     Past Medical History:  Diagnosis Date  . Anxiety   . Anxiety and depression   . Arthritis   . Benign neoplasm of esophagus   . CAD (coronary artery disease)   . Cancer [facility])    Skin cancer on back  . Carotid artery disease ([place])   . CHF (congestive heart failure) ([place])   . Chronic gastritis   . Colon, diverticulosis   . COPD (chronic obstructive pulmonary disease) ([place])   . Depression   . GERD (gastroesophageal reflux disease)   . Hiatal hernia   . High frequency hearing loss   . Hyperlipidemia   . IBS (irritable bowel syndrome)   . Idiopathic pulmonary fibrosis   . Internal hemorrhoid   . MI (myocardial infarction) ([place]) 1985  . Other nonthrombocytopenic purpuras   . PONV (postoperative nausea and vomiting)   . PVD (peripheral vascular disease) ([place])   . Shortness of breath    with exertion  . Solitary pulmonary nodule   . Stroke [facility])    Hx: of several mini -strokes  . Tinnitus    chronic  . Vertigo     Past Surgical History:  Procedure Laterality Date  . ANGIOPLASTY  1997, 98, 99  . BREAST SURGERY     Hx; of biopsy  . CARDIAC  CATHETERIZATION N/A 08/10/2016   Procedure: Left Heart Cath and Coronary Angiography;  Surgeon: Belva Crome, MD;  Location: [place] [facility];  Service: Cardiovascular;  Laterality: N/A;  . CAROTID ENDARTERECTOMY     left side x 3, saphenous vein graft from left leg  . CATARACT EXTRACTION W/ INTRAOCULAR LENS  IMPLANT, BILATERAL    . COLON SURGERY    . COLONOSCOPY  11-27-01, 10-30-05, 05-24-11   diverticulosis, hemorrhoids  . DENTAL SURGERY     2012   . ENDARTERECTOMY Right 03/03/2014   Procedure: RIGHT CAROTID ENDARTERECTOMY WITH  PATCH ANGIOPLASTY;  Surgeon: Rosetta Posner, MD;  Location: [place];  Service: Vascular;  Laterality: Right;  . FOOT SURGERY     bilateral  . SHOULDER SURGERY     left  . TOE SURGERY     right foot  . TONSILLECTOMY    . TUBAL LIGATION    . UPPER GASTROINTESTINAL ENDOSCOPY  11-27-01, 08-18-08   Hiatal hernia, benign esophagus neoplasia, gastritis, tortuous esophagus 49 Maloney dilation performed    Social History   Social History  . Marital status: Widowed    Spouse name: N/A  . Number of children: 6  . Years of education: N/A   Occupational History  . 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 Smoking status: Former Smoker    Packs/day: 0.50    Years: 60.00    Types: Cigarettes    Quit date: 02/26/2014  . Smokeless tobacco: Never Used  . Alcohol use No  . Drug use: No  . Sexual activity: No   Other Topics Concern  . Not on file   Social History Narrative   [place] to [place] twice a year to visit sister- 3-4 weeks travel there every year.   Pt does not get regular exercise    Family History  Problem Relation Age of Onset  . Heart attack Father     deceased at 55, MGF  . Colon cancer Father   . Prostate cancer Father   . Heart disease Father   . Hyperlipidemia Father   . Heart disease Mother     deaceased at age 47  . Colitis Mother   . Stroke Mother   .  Hyperlipidemia Mother   . Colitis Sister   . Hyperlipidemia Sister   . Other Brother     deceased age 71; war  . Hyperlipidemia Brother   . Breast cancer Maternal Aunt     great   . Lung cancer Paternal Aunt     great  . Vasculitis Son   . Hyperlipidemia Son   . Hypertension Son   . Heart attack Son   . Cancer Sister     unknown  . Hyperlipidemia Sister   . Hyperlipidemia Daughter   . Hypertension Daughter   . COPD Neg Hx   . Asthma Neg Hx     Review of Systems  Constitutional: Negative for diaphoresis and fever.  Respiratory: Positive for cough (coughs 1-2 times a day) and shortness of breath. Negative for wheezing.   Cardiovascular: Negative for chest pain, palpitations and leg swelling.  Gastrointestinal: Positive for nausea (with arm pain).  Neurological: Positive for light-headedness. Negative for headaches.       Objective:   Vitals:   02/18/17 1600  BP: 134/88  Pulse: 92  Resp: 16  Temp: 97.4 F (36.3 C)   Wt Readings from Last 3 Encounters:  02/18/17 144 lb (65.3 kg)  02/07/17 139 lb (63 kg)  01/23/17 134 lb (60.8 kg)   Body mass index is 30.1 kg/m.   Physical Exam    Constitutional: Appears well-developed and well-nourished. No distress.  HENT:  Head: Normocephalic and atraumatic.  Neck: Neck supple. No tracheal deviation present. No thyromegaly present.  No cervical lymphadenopathy Cardiovascular: Normal rate, regular rhythm and normal heart sounds.   No murmur heard. No carotid bruit .  No edema Pulmonary/Chest:  No respiratory distress. No has no wheezes. Dry crackles  throughout lungs - worse at bases.  Skin: Skin is warm and dry. Not diaphoretic.  Psychiatric: Normal mood and affect. Behavior is normal.      Assessment & Plan:    See Problem List for Assessment and Plan of chronic medical problems.

## 2017-02-18 ENCOUNTER — Ambulatory Visit (INDEPENDENT_AMBULATORY_CARE_PROVIDER_SITE_OTHER): Payer: Commercial Managed Care - PPO | Admitting: Internal Medicine

## 2017-02-18 ENCOUNTER — Ambulatory Visit: Payer: Self-pay | Admitting: Internal Medicine

## 2017-02-18 ENCOUNTER — Encounter: Payer: Self-pay | Admitting: Internal Medicine

## 2017-02-18 ENCOUNTER — Other Ambulatory Visit: Payer: Self-pay

## 2017-02-18 VITALS — BP 134/88 | HR 92 | Temp 97.4°F | Resp 16 | Wt 144.0 lb

## 2017-02-18 DIAGNOSIS — M79602 Pain in left arm: Secondary | ICD-10-CM | POA: Diagnosis not present

## 2017-02-18 NOTE — Progress Notes (Signed)
Pre visit review using our clinic review tool, if applicable. No additional management support is needed unless otherwise documented below in the visit note. 

## 2017-02-18 NOTE — Patient Instructions (Addendum)
Use the nitroglycerin when you have the left arm pain.    I will contact Dr Stanford Breed and Dr Early regarding your symptoms.

## 2017-02-18 NOTE — Patient Outreach (Signed)
[place] [facility]) Care Management  02/18/2017  Linda Kelly Mar 08, 1928 [id number]   Subjective: None  Objective: None  Assessment: 83 year old with recent admission 3/8-3/14 for Heart failure, respiratory failure, healthcare acquired pneumonia. History of copd, MI, HTN, pulmonary fibrosis.  RNCM received notification EMMI red flag completed by client on 02/15/17: new problems;new worsening problems; tired/fatigued. RNCM spoke with client on 02/15/17. These issues addressed therefore no call today regarding this notification.  Plan: attend home visit scheduled for next week.  Thea Silversmith, RN, MSN, [place] Coordinator Cell: [phone]

## 2017-02-18 NOTE — Assessment & Plan Note (Signed)
Two weeks of intermittent left arm pain - tingling/numb/ pain - reminds her of when her carotid artery was stenosed Concern for possible angina vs pain from subclavian steal/carotid artery disease Will refer to Dr Early urgently - move up appt - may need to see Dr Stanford Breed as well

## 2017-02-19 ENCOUNTER — Other Ambulatory Visit: Payer: Self-pay

## 2017-02-19 ENCOUNTER — Telehealth: Payer: Self-pay

## 2017-02-19 NOTE — Telephone Encounter (Signed)
rec'd call from [facility].  Requested to move pt's Carotid Duplex and office evaluation up to an urgent appt. Reported the pt. c/o pain in left arm with tingling and numbness, and stated the symptoms are similar to when she had carotid artery stenosis in past.  Dr. Billey Gosling is requesting pt. Be seen more urgently.

## 2017-02-19 NOTE — Patient Outreach (Signed)
[facility]) Care Management  02/19/2017  SHARISE LIPPY 1928-03-11 [id number]  Subjective: "we'll I did, but she could not get my weight".  Objective: none-telephonic call  Assessment: 83 year old with recent admission 3/8-3/14 for Heart failure, respiratory failure, healthcare acquired pneumonia. History of copd, MI, HTN, pulmonary fibrosis  RNCM received notification EMMI red flag that client did not weigh self. Client reports she did weigh, but the EMMI system was not able to register what she was saying. Client reports her weight was 137 today and 137 on yesterday. Client with specific concerns at this time.  RNCM reinforced if weights increased more than 2-3 pounds in a day or 5 pounds in a week to notify cardiologist.  Plan: home visit scheduled for next week.  Thea Silversmith, RN, MSN, [place] Coordinator Cell: [phone]

## 2017-02-20 ENCOUNTER — Ambulatory Visit (HOSPITAL_COMMUNITY)
Admission: RE | Admit: 2017-02-20 | Discharge: 2017-02-20 | Disposition: A | Discharge status: Home / Self Care | Payer: PPO | Source: Ambulatory Visit | Attending: Vascular Surgery | Admitting: Vascular Surgery

## 2017-02-20 ENCOUNTER — Other Ambulatory Visit: Payer: Self-pay

## 2017-02-20 DIAGNOSIS — R200 Anesthesia of skin: Secondary | ICD-10-CM | POA: Diagnosis present

## 2017-02-20 DIAGNOSIS — I6529 Occlusion and stenosis of unspecified carotid artery: Secondary | ICD-10-CM

## 2017-02-20 DIAGNOSIS — I6522 Occlusion and stenosis of left carotid artery: Secondary | ICD-10-CM | POA: Insufficient documentation

## 2017-02-20 DIAGNOSIS — R202 Paresthesia of skin: Secondary | ICD-10-CM | POA: Diagnosis not present

## 2017-02-20 LAB — VAS US CAROTID
LCCADDIAS: -20 cm/s
LEFT ECA DIAS: -22 cm/s
Left CCA dist sys: -192 cm/s
Left CCA prox sys: 52 cm/s
Left ICA dist dias: -23 cm/s
Left ICA dist sys: -80 cm/s
Left ICA prox dias: -79 cm/s
Left ICA prox sys: -228 cm/s
RCCADSYS: -66 cm/s
RCCAPDIAS: 19 cm/s
RIGHT CCA MID DIAS: 31 cm/s
RIGHT ECA DIAS: -13 cm/s
Right CCA prox sys: 67 cm/s

## 2017-02-21 ENCOUNTER — Ambulatory Visit: Payer: Commercial Managed Care - PPO | Admitting: Family

## 2017-02-21 ENCOUNTER — Telehealth: Payer: Self-pay | Admitting: Family

## 2017-02-21 ENCOUNTER — Ambulatory Visit (INDEPENDENT_AMBULATORY_CARE_PROVIDER_SITE_OTHER): Payer: Commercial Managed Care - PPO | Admitting: Family

## 2017-02-21 ENCOUNTER — Encounter: Payer: Self-pay | Admitting: Family

## 2017-02-21 VITALS — BP 115/80 | HR 89 | Temp 96.6°F | Resp 20 | Ht <= 58 in | Wt 141.0 lb

## 2017-02-21 DIAGNOSIS — I771 Stricture of artery: Secondary | ICD-10-CM | POA: Diagnosis not present

## 2017-02-21 DIAGNOSIS — I6523 Occlusion and stenosis of bilateral carotid arteries: Secondary | ICD-10-CM | POA: Diagnosis not present

## 2017-02-21 DIAGNOSIS — Z9889 Other specified postprocedural states: Secondary | ICD-10-CM | POA: Diagnosis not present

## 2017-02-21 NOTE — Telephone Encounter (Signed)
Per instructions from Johney Maine and the blue discharge sheet from 02/21/17 I scheduled this patient an appointment for a CTA [place] at [address] location of [place] location at 11:30am. She knows to arrive at 11:10am and no solid foods 4 hours prior. And to also see Dr.Early at 1:30pm on 03/13/17. I sent a staff message to [facility] for pre-authorization of the CTA. And I mailed a letter to the patient with all the above information. awt

## 2017-02-21 NOTE — Patient Instructions (Signed)
Stroke Prevention Some medical conditions and behaviors are associated with an increased chance of having a stroke. You may prevent a stroke by making healthy choices and managing medical conditions. How can I reduce my risk of having a stroke?  Stay physically active. Get at least 30 minutes of activity on most or all days.  Do not smoke. It may also be helpful to avoid exposure to secondhand smoke.  Limit alcohol use. Moderate alcohol use is considered to be:  No more than 2 drinks per day for men.  No more than 1 drink per day for nonpregnant women.  Eat healthy foods. This involves:  Eating 5 or more servings of fruits and vegetables a day.  Making dietary changes that address high blood pressure (hypertension), high cholesterol, diabetes, or obesity.  Manage your cholesterol levels.  Making food choices that are high in fiber and low in saturated fat, trans fat, and cholesterol may control cholesterol levels.  Take any prescribed medicines to control cholesterol as directed by your health care provider.  Manage your diabetes.  Controlling your carbohydrate and sugar intake is recommended to manage diabetes.  Take any prescribed medicines to control diabetes as directed by your health care provider.  Control your hypertension.  Making food choices that are low in salt (sodium), saturated fat, trans fat, and cholesterol is recommended to manage hypertension.  Ask your health care provider if you need treatment to lower your blood pressure. Take any prescribed medicines to control hypertension as directed by your health care provider.  If you are 18-39 years of age, have your blood pressure checked every 3-5 years. If you are 40 years of age or older, have your blood pressure checked every year.  Maintain a healthy weight.  Reducing calorie intake and making food choices that are low in sodium, saturated fat, trans fat, and cholesterol are recommended to manage  weight.  Stop drug abuse.  Avoid taking birth control pills.  Talk to your health care provider about the risks of taking birth control pills if you are over 35 years old, smoke, get migraines, or have ever had a blood clot.  Get evaluated for sleep disorders (sleep apnea).  Talk to your health care provider about getting a sleep evaluation if you snore a lot or have excessive sleepiness.  Take medicines only as directed by your health care provider.  For some people, aspirin or blood thinners (anticoagulants) are helpful in reducing the risk of forming abnormal blood clots that can lead to stroke. If you have the irregular heart rhythm of atrial fibrillation, you should be on a blood thinner unless there is a good reason you cannot take them.  Understand all your medicine instructions.  Make sure that other conditions (such as anemia or atherosclerosis) are addressed. Get help right away if:  You have sudden weakness or numbness of the face, arm, or leg, especially on one side of the body.  Your face or eyelid droops to one side.  You have sudden confusion.  You have trouble speaking (aphasia) or understanding.  You have sudden trouble seeing in one or both eyes.  You have sudden trouble walking.  You have dizziness.  You have a loss of balance or coordination.  You have a sudden, severe headache with no known cause.  You have new chest pain or an irregular heartbeat. Any of these symptoms may represent a serious problem that is an emergency. Do not wait to see if the symptoms will go away.   Get medical help at once. Call your local emergency services (911 in [place] Do not drive yourself to the hospital. This information is not intended to replace advice given to you by your health care provider. Make sure you discuss any questions you have with your health care provider. Document Released: 11/22/2004 Document Revised: 03/22/2016 Document Reviewed: 04/17/2013 Elsevier  Interactive Patient Education  2017 Elsevier Inc.  

## 2017-02-21 NOTE — Progress Notes (Signed)
Chief Complaint: Follow up Extracranial Carotid Artery Stenosis   History of Present Illness  Linda Kelly is a 83 y.o. female who is status post right carotid endarterectomy in 2015, and left carotid endarterectomy in 1995, with redo endarterectomies in 88 and 99.  Dr. Donnetta Hutching has been monitoring her carotid artery stenosis.  She reports stable pulmonary fibrosis.  She returns today after our office received a phone call on 02-19-17 from [facility].  Requested to move pt's Carotid Duplex and office evaluation up to an urgent appt. Reported the pt. c/o pain in left arm with tingling and numbness, and stated the symptoms are similar to when she had carotid artery stenosis in the past.  Dr. Billey Gosling is requesting pt be seen more urgently.   Pt states she was advised by Dr. Quay Burow to take NTG to see if the left arm pain/tingling/numbness improved, pt states the pain did resolve, but that it returns every day in the late afternoon/evening in the left upper arm, and continues until she falls asleep. This pain/tingling/numbness started about 2 months ago.  She is left hand dominant.  She has not been evaluated for c-spine issues.   She uses 2L supplemental O2 around the clock. She past medical hx includes CHF,COPD, and idiopathic pulmonary fibrosis.   Serum creatinine was 1.27 on 01-23-17.   Pt Diabetic: no Pt smoker: former smoker, quit in 2015, smoked x 60 years  Pt meds include: Statin : yes ASA: yes Other anticoagulants/antiplatelets: Plavix   Past Medical History:  Diagnosis Date  . Anxiety   . Anxiety and depression   . Arthritis   . Benign neoplasm of esophagus   . CAD (coronary artery disease)   . Cancer [facility])    Skin cancer on back  . Carotid artery disease ([place])   . CHF (congestive heart failure) ([place])   . Chronic gastritis   . Colon, diverticulosis   . COPD (chronic obstructive pulmonary disease) ([place])   . Depression   . GERD (gastroesophageal reflux disease)    . Hiatal hernia   . High frequency hearing loss   . Hyperlipidemia   . IBS (irritable bowel syndrome)   . Idiopathic pulmonary fibrosis   . Internal hemorrhoid   . MI (myocardial infarction) ([place]) 1985  . Other nonthrombocytopenic purpuras   . PONV (postoperative nausea and vomiting)   . PVD (peripheral vascular disease) ([place])   . Shortness of breath    with exertion  . Solitary pulmonary nodule   . Stroke [facility])    Hx: of several mini -strokes  . Tinnitus    chronic  . Vertigo     Social History Social History  Substance Use Topics  . Smoking status: Former Smoker    Packs/day: 0.50    Years: 60.00    Types: Cigarettes    Quit date: 02/26/2014  . Smokeless tobacco: Never Used  . Alcohol use No    Family History Family History  Problem Relation Age of Onset  . Heart attack Father     deceased at 71, MGF  . Colon cancer Father   . Prostate cancer Father   . Heart disease Father   . Hyperlipidemia Father   . Heart disease Mother     deaceased at age 51  . Colitis Mother   . Stroke Mother   . Hyperlipidemia Mother   . Colitis Sister   . Hyperlipidemia Sister   . Other Brother     deceased age  23; war  . Hyperlipidemia Brother   . Breast cancer Maternal Aunt     great   . Lung cancer Paternal Aunt     great  . Vasculitis Son   . Hyperlipidemia Son   . Hypertension Son   . Heart attack Son   . Cancer Sister     unknown  . Hyperlipidemia Sister   . Hyperlipidemia Daughter   . Hypertension Daughter   . COPD Neg Hx   . Asthma Neg Hx     Surgical History Past Surgical History:  Procedure Laterality Date  . ANGIOPLASTY  1997, 98, 99  . BREAST SURGERY     Hx; of biopsy  . CARDIAC CATHETERIZATION N/A 08/10/2016   Procedure: Left Heart Cath and Coronary Angiography;  Surgeon: Belva Crome, MD;  Location: [place];  Service: Cardiovascular;  Laterality: N/A;  . CAROTID ENDARTERECTOMY     left side x 3, saphenous vein graft from left leg  .  CATARACT EXTRACTION W/ INTRAOCULAR LENS  IMPLANT, BILATERAL    . COLON SURGERY    . COLONOSCOPY  11-27-01, 10-30-05, 05-24-11   diverticulosis, hemorrhoids  . DENTAL SURGERY     2012   . ENDARTERECTOMY Right 03/03/2014   Procedure: RIGHT CAROTID ENDARTERECTOMY WITH PATCH ANGIOPLASTY;  Surgeon: Rosetta Posner, MD;  Location: [place];  Service: Vascular;  Laterality: Right;  . FOOT SURGERY     bilateral  . SHOULDER SURGERY     left  . TOE SURGERY     right foot  . TONSILLECTOMY    . TUBAL LIGATION    . UPPER GASTROINTESTINAL ENDOSCOPY  11-27-01, 08-18-08   Hiatal hernia, benign esophagus neoplasia, gastritis, tortuous esophagus 12 Maloney dilation performed    No Known Allergies  Current Outpatient Prescriptions  Medication Sig Dispense Refill  . acetaminophen (TYLENOL) 650 MG CR tablet Take 650 mg by mouth daily.    Marland Kitchen ALPRAZolam (XANAX) 0.25 MG tablet Take 1 tablet (0.25 mg total) by mouth 3 (three) times daily as needed for anxiety. 30 tablet 0  . aspirin EC 81 MG tablet Take 81 mg by mouth at bedtime.     . cholecalciferol (VITAMIN D) 1000 UNITS tablet Take 1,000 Units by mouth at bedtime.     . clopidogrel (PLAVIX) 75 MG tablet Take 1 tablet (75 mg total) by mouth daily. -- Office visit needed for further refills 90 tablet 0  . furosemide (LASIX) 40 MG tablet Take 1 tablet (40 mg total) by mouth daily. 30 tablet 6  . ipratropium-albuterol (DUONEB) 0.5-2.5 (3) MG/3ML SOLN Take 3 mLs by nebulization every 4 (four) hours as needed. 360 mL 4  . montelukast (SINGULAIR) 10 MG tablet Take 1 tablet (10 mg total) by mouth at bedtime. 90 tablet 3  . nitroGLYCERIN (NITROSTAT) 0.4 MG SL tablet Place 1 tablet (0.4 mg total) under the tongue every 5 (five) minutes x 3 doses as needed for chest pain. 25 tablet 3  . Omega-3 Fatty Acids (FISH OIL) 1000 MG CAPS Take 1,000 mg by mouth at bedtime.     . OXYGEN Inhale 2.5 L into the lungs continuous.    . potassium chloride (KLOR-CON 10) 10 MEQ tablet Take 0.5  tablets (5 mEq total) by mouth daily. 45 tablet 3  . ranitidine (ZANTAC) 150 MG tablet TAKE 1 TABLET BY MOUTH TWICE DAILY (Patient taking differently: Take 150 mg by mouth 2 (two) times daily. ) 180 tablet 3  . sertraline (ZOLOFT) 50 MG  tablet Take 1 tablet (50 mg total) by mouth daily. 90 tablet 1  . simvastatin (ZOCOR) 40 MG tablet Take 1 tablet (40 mg total) by mouth daily. 90 tablet 3  . vitamin E 400 UNIT capsule Take 400 Units by mouth at bedtime.      No current facility-administered medications for this visit.     Review of Systems : See HPI for pertinent positives and negatives.  Physical Examination  Vitals:   02/21/17 1204  BP: 115/80  Pulse: 89  Resp: 20  Temp: (!) 96.6 F (35.9 C)  TempSrc: Oral  SpO2: 91%  Weight: 141 lb (64 kg)  Height: 4\' 10"  (1.473 m)   Body mass index is 29.47 kg/m.   Left brachial blood pressure is 80 systolic by Doppler.   General: WDWN female in NAD GAIT: slow, steady, using walker Eyes: PERRLA Pulmonary:  Respirations are slightly labored at rest, limited air movement, no rales, rhonchi, or wheezing. Supplemental O2 via [place] and her tank.   Cardiac: regular rhythm, no detected murmur.  VASCULAR EXAM Carotid Bruits Right Left   Negative Negative    Aorta is not palpable. Radial pulses: 1+ palpable right and not palpable on the left, left brachial pulse is not palpable but has a brisk Doppler signal. All finger tips are pink with brisk capillary refill.                                                                                                                     LE Pulses Right Left       POPLITEAL  not palpable   not palpable       POSTERIOR TIBIAL  not palpable   faintly palpable        DORSALIS PEDIS      ANTERIOR TIBIAL faintly palpable  faintly palpable     Gastrointestinal: soft, nontender, BS WNL, no r/g, no palpable masses.  Musculoskeletal: No muscle atrophy/wasting. M/S 4/5 throughout, extremities without  ischemic changes. All toes are pink with brisk capillary refill   Neurologic: A&O X 3; Appropriate Affect, Speech is normal CN 2-12 intact, pain and light touch intact in extremities, Motor exam as listed above.     Assessment: ABRIE EGLOFF is a 83 y.o. female who presents with a 2 months hx of left upper arm pain/tingling/numbness that recurs every late afternoon and evening until she goes to bed.  She denies left leg symptoms, denies changes in her vision or speech.   She has not been evaluated for c-spine issues.   Right arm brachial pressure by automated sphygmomanometer is 115/80, unable to detect left arm pressure with this method; by manual sphygmomanometer and Doppler, her left brachial pressure is 80.; 35 mm Hg pressure gradient.  Her right radial pulse is 1+ palpable, left radial and brachial pulses are not palpable but are Dopplerable.  There is normal sensation to touch equally in both hands. Fingertips of both hands are pink with brisk capillary refill.  She is  left hand dominant, is able to use her left hand adequately.   Dr. Bridgett Larsson is on call; he had just started an operation when I spoke to the OR circulator nurse that answered his pager. After pt left, Dr. Bridgett Larsson returned my call. We discussed pt HPI, results of today's carotid duplex compared to the previous, and physical exam results. See Plan.  Serum creatinine was 1.27 on 01-23-17.    DATA (02/21/17): Carotid Duplex: Left ECA stenosis. Increased velocity in the left proximal ICA which appears to be due to a change in vessel diameter.  Unable to visualize left vertebral artery due to depth of vessel and monophasic left mid subclavian artery flow.  Right vertebral artery flow is antegrade, right subclavian artery waveforms are normal.  No restenosis of the right ICA (CEA site) with increased stenosis of the left proximal/mid ICA (79 cm/s proximal, 29 cm/s mid)) (CEA site), compared to the last exam on 05-08-16 (15 cm/s  proximal, 73 cm/s mid). >50% left mid/distal CCA stenosis at the proximal patch.    Plan:  Will schedule a CTA of her neck to include aortic arch and left subclavian artery, see Dr. Donnetta Hutching afterward.   The patient was given information about stroke prevention and what symptoms should prompt the patient to seek immediate medical care. Thank you for allowing [place] to participate in this patient's care.  Clemon Chambers, RN, MSN, FNP-C [facility] Office: [phone]  Clinic Physician: Bridgett Larsson on call  02/21/17 8:20 PM

## 2017-02-25 ENCOUNTER — Other Ambulatory Visit: Payer: Self-pay

## 2017-02-25 NOTE — Patient Outreach (Signed)
[place] [facility]) Care Management  02/25/2017  BECKI MCCASKILL 1928-03-17 [id number]    Subjective: "the machine picked it up wrong".  Objective: none-telephonic call  Assessment: 83 year old with recent admission 3/8-3/14 for Heart failure, respiratory failure, healthcare acquired pneumonia. History of copd, MI, HTN, pulmonary fibrosis.  RNCM received notification regarding EMMI red flag: "new/worsening problem and does not know why to take meds". Ms. Lahmann reports she was not having any issues at this time and stated the machine picked her answers up wrong. No issues noted today.  Plan: attend home visit scheduled for later in the week.  Thea Silversmith, RN, MSN, [place] Coordinator Cell: [phone]

## 2017-02-26 ENCOUNTER — Ambulatory Visit: Payer: Self-pay | Admitting: Family

## 2017-02-28 ENCOUNTER — Other Ambulatory Visit: Payer: Self-pay

## 2017-02-28 ENCOUNTER — Telehealth: Payer: Self-pay | Admitting: Internal Medicine

## 2017-02-28 DIAGNOSIS — J449 Chronic obstructive pulmonary disease, unspecified: Secondary | ICD-10-CM

## 2017-02-28 DIAGNOSIS — I2510 Atherosclerotic heart disease of native coronary artery without angina pectoris: Secondary | ICD-10-CM

## 2017-02-28 DIAGNOSIS — I5042 Chronic combined systolic (congestive) and diastolic (congestive) heart failure: Secondary | ICD-10-CM

## 2017-02-28 DIAGNOSIS — Z9861 Coronary angioplasty status: Secondary | ICD-10-CM

## 2017-02-28 DIAGNOSIS — J9611 Chronic respiratory failure with hypoxia: Secondary | ICD-10-CM

## 2017-02-28 DIAGNOSIS — J8410 Pulmonary fibrosis, unspecified: Secondary | ICD-10-CM

## 2017-02-28 MED ORDER — PREDNISONE 20 MG PO TABS: 20.0000 mg | ORAL_TABLET | Freq: Every day | ORAL | 0 refills | Status: DC

## 2017-02-28 NOTE — Patient Outreach (Signed)
[place] [facility]) Care Management   02/28/2017  Linda Kelly [birth year] [id number]  Linda Kelly is an 83 y.o. female  Subjective: "I am coughing a lot".  Objective:  BP 118/74   Pulse 100   Resp (!) 24 Comment: increased to 91 percent when sitting back resting.  Ht 1.473 m (4\' 10" )   Wt 138 lb (62.6 kg)   SpO2 (!) 88% Comment: increased to 91 percent at rest.  BMI 28.84 kg/m   Review of Systems  Respiratory: Positive for cough and shortness of breath. Negative for sputum production and wheezing.        Inspiratory crackles.  Cardiovascular:       Inspiratory crackles noted bilaterally. Heart rate regular tachycardia rate 100.    Physical Exam  Encounter Medications:   Outpatient Encounter Prescriptions as of 02/28/2017  Medication Sig Note  . acetaminophen (TYLENOL) 650 MG CR tablet Take 650 mg by mouth daily.   Marland Kitchen ALPRAZolam (XANAX) 0.25 MG tablet Take 1 tablet (0.25 mg total) by mouth 3 (three) times daily as needed for anxiety.   Marland Kitchen aspirin EC 81 MG tablet Take 81 mg by mouth at bedtime.    . cholecalciferol (VITAMIN D) 1000 UNITS tablet Take 1,000 Units by mouth at bedtime.  01/15/2017: Taking 2000IU  . clopidogrel (PLAVIX) 75 MG tablet Take 1 tablet (75 mg total) by mouth daily. -- Office visit needed for further refills   . furosemide (LASIX) 40 MG tablet Take 1 tablet (40 mg total) by mouth daily.   Marland Kitchen ipratropium-albuterol (DUONEB) 0.5-2.5 (3) MG/3ML SOLN Take 3 mLs by nebulization every 4 (four) hours as needed.   . montelukast (SINGULAIR) 10 MG tablet Take 1 tablet (10 mg total) by mouth at bedtime.   . nitroGLYCERIN (NITROSTAT) 0.4 MG SL tablet Place 1 tablet (0.4 mg total) under the tongue every 5 (five) minutes x 3 doses as needed for chest pain.   . Omega-3 Fatty Acids (FISH OIL) 1000 MG CAPS Take 1,000 mg by mouth at bedtime.  01/15/2017: Taking 500mg  EPA RxOmega-3  . OXYGEN Inhale 2.5 L into the lungs continuous.   . potassium chloride (KLOR-CON 10) 10  MEQ tablet Take 0.5 tablets (5 mEq total) by mouth daily.   . ranitidine (ZANTAC) 150 MG tablet TAKE 1 TABLET BY MOUTH TWICE DAILY (Patient taking differently: Take 150 mg by mouth 2 (two) times daily. )   . sertraline (ZOLOFT) 50 MG tablet Take 1 tablet (50 mg total) by mouth daily.   . simvastatin (ZOCOR) 40 MG tablet Take 1 tablet (40 mg total) by mouth daily.   . vitamin E 400 UNIT capsule Take 400 Units by mouth at bedtime.     No facility-administered encounter medications on file as of 02/28/2017.     Functional Status:   In your present state of health, do you have any difficulty performing the following activities: 01/15/2017 01/03/2017  Hearing? [name]  Vision? Y Y  Difficulty concentrating or making decisions? N N  Walking or climbing stairs? Y N  Dressing or bathing? Y Y  Doing errands, shopping? Y N  Preparing Food and eating ? Y -  Using the Toilet? N -  In the past six months, have you accidently leaked urine? Y -  Do you have problems with loss of bowel control? N -  Managing your Medications? N -  Managing your Finances? N -  Housekeeping or managing your Housekeeping? N -  Some recent data might  be hidden    Fall/Depression Screening:    Fall Risk  01/15/2017 03/27/2016 02/21/2016  Falls in the past year? No Yes Yes  Number falls in past yr: - 1 1  Injury with Fall? - Yes No  Risk Factor Category  - High Fall Risk -  Risk for fall due to : - History of fall(s) Impaired balance/gait  Follow up - - Falls evaluation completed   PHQ 2/9 Scores 01/15/2017 03/27/2016 02/21/2016 12/28/2013  PHQ - 2 Score 0 0 1 0    Assessment:  83 year old with recent admission 3/8-3/14 for Heart failure, respiratory failure, healthcare acquired pneumonia. History of copd, MI, HTN, pulmonary fibrosis.  Ms. Wolfrey reports dry cough that is getting worse, non productive, reports feeling more short of breath than usual with activity. She states when resting, she does not feel short of breath. Reports  using nebulizer 3-4 times/day. Client reports oxygen was decreased to 2 L/[place] at last pulmonology visit. She reports she dressed herself this morning, but has to rest in between putting on each item of clothing. Mrs. Burr reports she able to go to the store with her daughter on yesterday, but states that was unusual for her.  Discussed palliative care and Hospice. Daughter present during discussion. Client states some of her children have spoken about Hospice and she would like someone to come to her house to talk with her regarding Hospice services and what they have to offer.  RNCM called primary care to notify of client's request for hospice to come to her house to discuss their services.  RNCM called pulmonology regarding clients increased cough and shortness of breath with activity.  Plan: telephonic follow up call in the next 2-3 weeks.  Thea Silversmith, RN, MSN, [place] Coordinator Cell: [phone]

## 2017-02-28 NOTE — Telephone Encounter (Signed)
Please advise 

## 2017-02-28 NOTE — Telephone Encounter (Signed)
Pt c/o worsening nonprod cough, sob with minimal activity X2-3 days.  Denies fever, CP, mucus production, sinus congestion.  Pt is wearing 2lpm O2- SPO2 is dropping down to 88% with exertion on this.  Is staying in low 90's% at rest. Per pt, 02 was dropped from 3lm to 2lpm at 4/12 OV with TP.  Pt requesting further recs.    Pt uses [facility] on [address].    CY please advise.  Thanks.

## 2017-02-28 NOTE — Telephone Encounter (Signed)
Called and stated Linda Kelly would like a referral to Hospice to learn about their services. She wants to understand what hospice does.

## 2017-02-28 NOTE — Telephone Encounter (Signed)
Referral ordered

## 2017-02-28 NOTE — Telephone Encounter (Signed)
Called and spoke with pt and she is aware of recs from [place].  She will try this and update accordingly .  Nothing further is needed.

## 2017-02-28 NOTE — Telephone Encounter (Signed)
Suggest prednisone 20 mg, # 4, 1 daily x 4 days                Ok to turn O2 up to 3-4 L as needed with exertion. Go back down to 2L when able.

## 2017-03-04 ENCOUNTER — Telehealth: Payer: Self-pay | Admitting: Cardiology

## 2017-03-04 ENCOUNTER — Encounter: Payer: Self-pay | Admitting: Cardiology

## 2017-03-04 ENCOUNTER — Telehealth: Payer: Self-pay | Admitting: Internal Medicine

## 2017-03-04 MED ORDER — FUROSEMIDE 40 MG PO TABS: ORAL_TABLET | ORAL | 6 refills | Status: DC

## 2017-03-04 NOTE — Telephone Encounter (Signed)
New Message    Burman Nieves is a private rn for pt and she verbalized that she is calling for rn   To go over pt medications, she said that she noticed that pt is not on anything for heart failure

## 2017-03-04 NOTE — Telephone Encounter (Signed)
Spoke with patient and verbal ok to speak with [facility] given. Maggie who is an Therapist, sports and the patients granddaughter was asking why patient was not on a beta blocker or ACE/ARB and was supposed to be she was discharged from the hospital. Reviewed hospital d/c summary from 01/03/17 and explained to [facility] that patient had hypotension and that was the reason she was just on the Lasix 40 mg daily only. Did ask if the patient had been having blood pressure checks at home and Burman Nieves stated they were fine. Per Burman Nieves patients baseline systolic blood pressure is usually  100-110 and that she can drop to 70's and be asymptomatic. Patients weight up 3 pounds since 03/02/17 with some increased shortness of breath. Denies any swelling in extremities but does not usually present with edema. Burman Nieves says she can hear the fluid in her throat sitting beside her. Maggie would like to at least try increasing her Lasix if no additional medications will be added at this time. She has a follow up scheduled with Dr Stanford Breed on 03/19/17 Will forward to Dr Stanford Breed for review

## 2017-03-04 NOTE — Telephone Encounter (Signed)
Ok to increase lasix to 40 q am and 20 q pm; bmet one week; fu with me as scheduled and will review additional meds at that time Kirk Ruths

## 2017-03-04 NOTE — Telephone Encounter (Signed)
Advised Maggie Have tried to call daughter about where to follow up for labs several times today and unable to reach secondary to a recording number not reachable Left message for Burman Nieves to call back tomorrow

## 2017-03-04 NOTE — Telephone Encounter (Signed)
Would like to review patients med.  States patient has CHF and nothing prescribed that she knows of.  Would like a follow up call in regard.  I did advise for a call to be made to cardiology as well.

## 2017-03-05 ENCOUNTER — Telehealth: Payer: Self-pay | Admitting: Cardiology

## 2017-03-05 NOTE — Telephone Encounter (Signed)
s/w pt's daughter Conger,Janet she states that pt is SOB and has swelling since Saturday she states that she would like to see the PA and discuss. Pt is taking 40mg  lasix in the am and 20mg  in the PM she will increase pm dose to 40mg  and call back if this helps.pt denies any other sx (chest pain or pressure, nausea, etc...) Made an appt with Lurena Joiner 5-17 to discuss swelling and sob. daughter will call back with update before Friday.

## 2017-03-05 NOTE — Telephone Encounter (Signed)
Tried contacting pts daughter. Spoke with pt to inform that we would not be able to speak with Maggie due to her not being on DPR. She states that she does not fill that the Lasix is enough, advised her to contact Dr Lonia Skinner office to discuss change.

## 2017-03-05 NOTE — Telephone Encounter (Signed)
Hospice has been contacted. They will contact pts family to set up a time to see pt.

## 2017-03-05 NOTE — Telephone Encounter (Signed)
Patient daughter calling, states that she believes that she has a buildup of fluid around heart and lungs.. Patient complaining difficulty breathing and dizzy and has been experiencing this since Saturday. Thanks.

## 2017-03-07 NOTE — Telephone Encounter (Signed)
Linda Rocher, LPN   Note    s/w pt's daughter Conger,Janet she states that pt is SOB and has swelling since Saturday she states that she would like to see the PA and discuss. Pt is taking 40mg  lasix in the am and 20mg  in the PM she will increase pm dose to 40mg  and call back if this helps.pt denies any other sx (chest pain or pressure, nausea, etc...) Made an appt with Lurena Joiner 5-17 to discuss swelling and sob. daughter will call back with update before Friday.

## 2017-03-08 ENCOUNTER — Other Ambulatory Visit: Payer: Self-pay | Admitting: Internal Medicine

## 2017-03-11 ENCOUNTER — Encounter: Payer: Self-pay | Admitting: Vascular Surgery

## 2017-03-13 ENCOUNTER — Ambulatory Visit (INDEPENDENT_AMBULATORY_CARE_PROVIDER_SITE_OTHER): Payer: PPO | Admitting: Vascular Surgery

## 2017-03-13 ENCOUNTER — Ambulatory Visit: Payer: Self-pay | Admitting: Vascular Surgery

## 2017-03-13 ENCOUNTER — Encounter: Payer: Self-pay | Admitting: Vascular Surgery

## 2017-03-13 ENCOUNTER — Ambulatory Visit
Admission: RE | Admit: 2017-03-13 | Discharge: 2017-03-13 | Disposition: A | Discharge status: Home / Self Care | Payer: PPO | Source: Ambulatory Visit | Attending: Family | Admitting: Family

## 2017-03-13 VITALS — BP 0/0 | HR 94 | Temp 97.1°F | Resp 16 | Ht <= 58 in | Wt 143.0 lb

## 2017-03-13 DIAGNOSIS — I6523 Occlusion and stenosis of bilateral carotid arteries: Secondary | ICD-10-CM

## 2017-03-13 DIAGNOSIS — I771 Stricture of artery: Secondary | ICD-10-CM

## 2017-03-13 MED ORDER — IOPAMIDOL (ISOVUE-370) INJECTION 76%
80.0000 mL | Freq: Once | INTRAVENOUS | Status: AC | PRN
  Administered 2017-03-13: 80 mL via INTRAVENOUS

## 2017-03-13 NOTE — Progress Notes (Signed)
[facility] [place]  Patient name: Linda Kelly MRN: [id number] DOB: 1928/05/24 Sex: female  REASON FOR VISIT: Discussed CT angiogram chest and neck today for follow-up of diffuse severe extracranial cerebrovascular occlusive disease  HPI: Linda Kelly is a 83 y.o. female here today for follow-up. She had seen Achille Rich nurse practitioner couple weeks ago which time she was describing some discomfort in her left arm. She has known supra aortic trunk occlusive disease and underwent CT angiogram for further evaluation. She had this earlier today and is here now for discussion. She is here today with her daughter. She is her usual spry self. She has written down the complaints that she has which include release soreness which comes and goes in her left bicep area. This is unrelated to activity. She reports that she can occasionally have this in her right arm as well but much less common than her left bicep region. She has a reports occasional cramping in her hands and feet that is unrelated to activity. She is on chronic oxygen therapy and has frequent nosebleeds associated with this. She reports that she is sleepy all the time and occasionally has positional dizziness. She specifically denies any focal neurologic deficits.  Past Medical History:  Diagnosis Date  . Anxiety   . Anxiety and depression   . Arthritis   . Benign neoplasm of esophagus   . CAD (coronary artery disease)   . Cancer [facility])    Skin cancer on back  . Carotid artery disease ([place])   . CHF (congestive heart failure) ([place])   . Chronic gastritis   . Colon, diverticulosis   . COPD (chronic obstructive pulmonary disease) ([place])   . Depression   . GERD (gastroesophageal reflux disease)   . Hiatal hernia   . High frequency hearing loss   . Hyperlipidemia   . IBS (irritable bowel syndrome)   . Idiopathic pulmonary fibrosis   . Internal hemorrhoid   . MI  (myocardial infarction) ([place]) 1985  . Other nonthrombocytopenic purpuras   . PONV (postoperative nausea and vomiting)   . PVD (peripheral vascular disease) ([place])   . Shortness of breath    with exertion  . Solitary pulmonary nodule   . Stroke [facility])    Hx: of several mini -strokes  . Tinnitus    chronic  . Vertigo     Family History  Problem Relation Age of Onset  . Heart attack Father        deceased at 63, MGF  . Colon cancer Father   . Prostate cancer Father   . Heart disease Father   . Hyperlipidemia Father   . Heart disease Mother        deaceased at age 26  . Colitis Mother   . Stroke Mother   . Hyperlipidemia Mother   . Colitis Sister   . Hyperlipidemia Sister   . Other Brother        deceased age 58; war  . Hyperlipidemia Brother   . Breast cancer Maternal Aunt        great   . Lung cancer Paternal Aunt        great  . Vasculitis Son   . Hyperlipidemia Son   . Hypertension Son   . Heart attack Son   . Cancer Sister        unknown  . Hyperlipidemia Sister   . Hyperlipidemia Daughter   . Hypertension Daughter   . COPD Neg  Hx   . Asthma Neg Hx     SOCIAL HISTORY: Social History  Substance Use Topics  . Smoking status: Former Smoker    Packs/day: 0.50    Years: 60.00    Types: Cigarettes    Quit date: 02/26/2014  . Smokeless tobacco: Never Used  . Alcohol use No    No Known Allergies  Current Outpatient Prescriptions  Medication Sig Dispense Refill  . acetaminophen (TYLENOL) 650 MG CR tablet Take 650 mg by mouth daily.    Marland Kitchen ALPRAZolam (XANAX) 0.25 MG tablet Take 1 tablet (0.25 mg total) by mouth 3 (three) times daily as needed for anxiety. 30 tablet 0  . aspirin EC 81 MG tablet Take 81 mg by mouth at bedtime.     . cholecalciferol (VITAMIN D) 1000 UNITS tablet Take 1,000 Units by mouth at bedtime.     . clopidogrel (PLAVIX) 75 MG tablet Take 1 tablet (75 mg total) by mouth daily. 90 tablet 1  . furosemide (LASIX) 40 MG tablet 1 tablet by mouth  every morning and 1/2 tablet in the evening 45 tablet 6  . ipratropium-albuterol (DUONEB) 0.5-2.5 (3) MG/3ML SOLN Take 3 mLs by nebulization every 4 (four) hours as needed. 360 mL 4  . montelukast (SINGULAIR) 10 MG tablet Take 1 tablet (10 mg total) by mouth at bedtime. 90 tablet 3  . nitroGLYCERIN (NITROSTAT) 0.4 MG SL tablet Place 1 tablet (0.4 mg total) under the tongue every 5 (five) minutes x 3 doses as needed for chest pain. 25 tablet 3  . Omega-3 Fatty Acids (FISH OIL) 1000 MG CAPS Take 1,000 mg by mouth at bedtime.     . OXYGEN Inhale 2.5 L into the lungs continuous.    . potassium chloride (KLOR-CON 10) 10 MEQ tablet Take 0.5 tablets (5 mEq total) by mouth daily. 45 tablet 3  . predniSONE (DELTASONE) 20 MG tablet Take 1 tablet (20 mg total) by mouth daily with breakfast. 4 tablet 0  . ranitidine (ZANTAC) 150 MG tablet TAKE 1 TABLET BY MOUTH TWICE DAILY (Patient taking differently: Take 150 mg by mouth 2 (two) times daily. ) 180 tablet 3  . sertraline (ZOLOFT) 50 MG tablet Take 1 tablet (50 mg total) by mouth daily. 90 tablet 1  . simvastatin (ZOCOR) 40 MG tablet Take 1 tablet (40 mg total) by mouth daily. 90 tablet 3  . vitamin E 400 UNIT capsule Take 400 Units by mouth at bedtime.      No current facility-administered medications for this visit.     REVIEW OF SYSTEMS:  [X]  denotes positive finding, [ ]  denotes negative finding Cardiac  Comments:  Chest pain or chest pressure:    Shortness of breath upon exertion: x   Short of breath when lying flat: x   Irregular heart rhythm:        Vascular    Pain in calf, thigh, or hip brought on by ambulation:    Pain in feet at night that wakes you up from your sleep:     Blood clot in your veins:    Leg swelling:           PHYSICAL EXAM: Vitals:   03/13/17 1359 03/13/17 1401  BP: 122/82 (!) 0/0  Pulse: 94   Resp: 16   Temp: 97.1 F (36.2 C)   TempSrc: Oral   SpO2: 96%   Weight: 143 lb (64.9 kg)   Height: 4\' 10"  (1.473 m)       GENERAL: The  patient is a well-nourished female, in no acute distress. The vital signs are documented above. CARDIOVASCULAR: Easily palpable right radial pulse. I do not palpate left radial pulse. She has well-healed carotid incisions bilaterally with no evidence of bruits bilaterally PULMONARY: There is good air exchange  MUSCULOSKELETAL: There are no major deformities or cyanosis. NEUROLOGIC: No focal weakness or paresthesias are detected. SKIN: There are no ulcers or rashes noted. PSYCHIATRIC: The patient has a normal affect.  DATA:  The angiogram was reviewed and discussed with the patient. This shows extremely severe diffuse disease in her arch vessels. On the right she has an 80% stenosis in her right subclavian artery at its origin and also an 80% stenosis in the vertebral artery on the right after its takeoff. On the left she has 50% stenosis of her common carotid artery at the origin. She has had prior carotid endarterectomy and there is a weblike stenosis below the endarterectomy site and also in the internal carotid artery above this. She has a 90% stenosis in her left subclavian takeoff from the arch and has complete occlusion of her left vertebral artery.  MEDICAL ISSUES: Had long discussion with the patient and her daughter regarding all this. I do not feel that she has any symptoms referable to this with the possible exception of her dizziness. She reports that her dizziness is tolerable and that when it does occur she simply sits and gathers herself and then presses the head. I did explain that the weblike stenosis in her internal carotid on the left I could potentially put her at increased risk for stroke however she has had prior endarterectomies. She is comfortable with this and wishes observation only. She is more concerned with a disabling stroke and she is with dying. She was quite comfortable with this entire discussion and does not want anything done that would not make her  feel better she freely reports that she actually feels pretty good. I would not recommend ongoing ultrasound follow-up with her diffuse disease as this would be pointless. If she has symptoms referable to her disease she will notify us and we would potentially intervening at that time. Otherwise we'll not see her actively in that she has issues.    Rosetta Posner, MD FACS [facility] Tel [phone] Pager [phone]

## 2017-03-14 ENCOUNTER — Ambulatory Visit (INDEPENDENT_AMBULATORY_CARE_PROVIDER_SITE_OTHER): Payer: PPO | Admitting: Cardiology

## 2017-03-14 ENCOUNTER — Encounter: Payer: Self-pay | Admitting: Cardiology

## 2017-03-14 ENCOUNTER — Telehealth: Payer: Self-pay | Admitting: Emergency Medicine

## 2017-03-14 DIAGNOSIS — I739 Peripheral vascular disease, unspecified: Secondary | ICD-10-CM | POA: Diagnosis not present

## 2017-03-14 DIAGNOSIS — J8410 Pulmonary fibrosis, unspecified: Secondary | ICD-10-CM | POA: Diagnosis not present

## 2017-03-14 DIAGNOSIS — I2510 Atherosclerotic heart disease of native coronary artery without angina pectoris: Secondary | ICD-10-CM

## 2017-03-14 DIAGNOSIS — Z9861 Coronary angioplasty status: Secondary | ICD-10-CM

## 2017-03-14 DIAGNOSIS — I255 Ischemic cardiomyopathy: Secondary | ICD-10-CM | POA: Insufficient documentation

## 2017-03-14 MED ORDER — METOPROLOL SUCCINATE ER 25 MG PO TB24: 12.5000 mg | ORAL_TABLET | Freq: Every day | ORAL | 5 refills | Status: DC

## 2017-03-14 MED ORDER — ISOSORBIDE MONONITRATE ER 30 MG PO TB24: 15.0000 mg | ORAL_TABLET | Freq: Every day | ORAL | 5 refills | Status: DC

## 2017-03-14 NOTE — Assessment & Plan Note (Signed)
Remote PCI- Cath Oct 2017- severe CAD, not a candidate for PCI or CABG- plan medical Rx

## 2017-03-14 NOTE — Telephone Encounter (Signed)
PA for Plavix has been completed. Awaiting response.

## 2017-03-14 NOTE — Progress Notes (Deleted)
HPI: FU carotid artery disease, CAD, HLD, pulmonary fibrosis. Her cardiac issues date back to 75 when she had an MI. She was treated with a balloon angioplasty at that time. Renal dopplers 12/12 showed stable 1-59 bilateral renal artery stenosis. Patient had a right carotid endarterectomy in May 2015. Echocardiogram October 2017 showed ejection fraction 02-58%, grade 2 diastolic dysfunction and mild to moderate mitral regurgitation. Cardiac catheterization October 2017 showed high-grade obstruction in the right iliac artery. There was severe multivessel coronary artery disease with occlusion of the right coronary artery, occlusion of the LAD, 99% distal circumflex and a very tortuous segment. Ejection fraction 35-45%. Medical therapy recommended. Surgery was not felt to be possible given her age and comorbidities. Chest CT January 2018 showed interstitial lung disease. Atherosclerosis noted and thoracic aortic aneurysm measuring 4.2 cm. Patient also with severe extracranial cerebrovascular occlusive disease that is being treated medically. Since last seen,   Current Outpatient Prescriptions  Medication Sig Dispense Refill  . acetaminophen (TYLENOL) 650 MG CR tablet Take 650 mg by mouth daily.    Marland Kitchen ALPRAZolam (XANAX) 0.25 MG tablet Take 1 tablet (0.25 mg total) by mouth 3 (three) times daily as needed for anxiety. 30 tablet 0  . aspirin EC 81 MG tablet Take 81 mg by mouth at bedtime.     . cholecalciferol (VITAMIN D) 1000 UNITS tablet Take 1,000 Units by mouth at bedtime.     . clopidogrel (PLAVIX) 75 MG tablet Take 1 tablet (75 mg total) by mouth daily. 90 tablet 1  . furosemide (LASIX) 40 MG tablet 1 tablet by mouth every morning and 1/2 tablet in the evening 45 tablet 6  . ipratropium-albuterol (DUONEB) 0.5-2.5 (3) MG/3ML SOLN Take 3 mLs by nebulization every 4 (four) hours as needed. 360 mL 4  . montelukast (SINGULAIR) 10 MG tablet Take 1 tablet (10 mg total) by mouth at bedtime. 90 tablet  3  . nitroGLYCERIN (NITROSTAT) 0.4 MG SL tablet Place 1 tablet (0.4 mg total) under the tongue every 5 (five) minutes x 3 doses as needed for chest pain. 25 tablet 3  . Omega-3 Fatty Acids (FISH OIL) 1000 MG CAPS Take 1,000 mg by mouth at bedtime.     . OXYGEN Inhale 2.5 L into the lungs continuous.    . potassium chloride (KLOR-CON 10) 10 MEQ tablet Take 0.5 tablets (5 mEq total) by mouth daily. 45 tablet 3  . predniSONE (DELTASONE) 20 MG tablet Take 1 tablet (20 mg total) by mouth daily with breakfast. 4 tablet 0  . ranitidine (ZANTAC) 150 MG tablet TAKE 1 TABLET BY MOUTH TWICE DAILY (Patient taking differently: Take 150 mg by mouth 2 (two) times daily. ) 180 tablet 3  . sertraline (ZOLOFT) 50 MG tablet Take 1 tablet (50 mg total) by mouth daily. 90 tablet 1  . simvastatin (ZOCOR) 40 MG tablet Take 1 tablet (40 mg total) by mouth daily. 90 tablet 3  . vitamin E 400 UNIT capsule Take 400 Units by mouth at bedtime.      No current facility-administered medications for this visit.      Past Medical History:  Diagnosis Date  . Anxiety   . Anxiety and depression   . Arthritis   . Benign neoplasm of esophagus   . CAD (coronary artery disease)   . Cancer [facility])    Skin cancer on back  . Carotid artery disease ([place])   . CHF (congestive heart failure) ([place])   . Chronic gastritis   .  Colon, diverticulosis   . COPD (chronic obstructive pulmonary disease) ([place])   . Depression   . GERD (gastroesophageal reflux disease)   . Hiatal hernia   . High frequency hearing loss   . Hyperlipidemia   . IBS (irritable bowel syndrome)   . Idiopathic pulmonary fibrosis   . Internal hemorrhoid   . MI (myocardial infarction) ([place]) 1985  . Other nonthrombocytopenic purpuras   . PONV (postoperative nausea and vomiting)   . PVD (peripheral vascular disease) ([place])   . Shortness of breath    with exertion  . Solitary pulmonary nodule   . Stroke [facility])    Hx: of several mini -strokes  . Tinnitus     chronic  . Vertigo     Past Surgical History:  Procedure Laterality Date  . ANGIOPLASTY  1997, 98, 99  . BREAST SURGERY     Hx; of biopsy  . CARDIAC CATHETERIZATION N/A 08/10/2016   Procedure: Left Heart Cath and Coronary Angiography;  Surgeon: Belva Crome, MD;  Location: [facility];  Service: Cardiovascular;  Laterality: N/A;  . CAROTID ENDARTERECTOMY     left side x 3, saphenous vein graft from left leg  . CATARACT EXTRACTION W/ INTRAOCULAR LENS  IMPLANT, BILATERAL    . COLON SURGERY    . COLONOSCOPY  11-27-01, 10-30-05, 05-24-11   diverticulosis, hemorrhoids  . DENTAL SURGERY     2012   . ENDARTERECTOMY Right 03/03/2014   Procedure: RIGHT CAROTID ENDARTERECTOMY WITH PATCH ANGIOPLASTY;  Surgeon: Rosetta Posner, MD;  Location: [facility];  Service: Vascular;  Laterality: Right;  . FOOT SURGERY     bilateral  . SHOULDER SURGERY     left  . TOE SURGERY     right foot  . TONSILLECTOMY    . TUBAL LIGATION    . UPPER GASTROINTESTINAL ENDOSCOPY  11-27-01, 08-18-08   Hiatal hernia, benign esophagus neoplasia, gastritis, tortuous esophagus 27 [place]oney dilation performed    Social History   Social History  . Marital status: Widowed    Spouse name: N/A  . Number of children: 6  . Years of education: N/A   Occupational History  . 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 Smoking status: Former Smoker    Packs/day: 0.50    Years: 60.00    Types: Cigarettes    Quit date: 02/26/2014  . Smokeless tobacco: Never Used  . Alcohol use No  . Drug use: No  . Sexual activity: No   Other Topics Concern  . Not on file   Social History Narrative   [place] to [place] twice a year to visit sister- 3-4 weeks travel there every year.   Pt does not get regular exercise    Family History  Problem Relation Age of Onset  . Heart attack Father        deceased at 59, MGF  . Colon cancer Father   . Prostate cancer  Father   . Heart disease Father   . Hyperlipidemia Father   . Heart disease Mother        deaceased at age 54  . Colitis Mother   . Stroke Mother   . Hyperlipidemia Mother   . Colitis Sister   . Hyperlipidemia Sister   . Other Brother        deceased age 55; war  . Hyperlipidemia Brother   . Breast  cancer Maternal Aunt        great   . Lung cancer Paternal Aunt        great  . Vasculitis Son   . Hyperlipidemia Son   . Hypertension Son   . Heart attack Son   . Cancer Sister        unknown  . Hyperlipidemia Sister   . Hyperlipidemia Daughter   . Hypertension Daughter   . COPD Neg Hx   . Asthma Neg Hx     ROS: no fevers or chills, productive cough, hemoptysis, dysphasia, odynophagia, melena, hematochezia, dysuria, hematuria, rash, seizure activity, orthopnea, PND, pedal edema, claudication. Remaining systems are negative.  Physical Exam: Well-developed well-nourished in no acute distress.  Skin is warm and dry.  HEENT is normal.  Neck is supple.  Chest is clear to auscultation with normal expansion.  Cardiovascular exam is regular rate and rhythm.  Abdominal exam nontender or distended. No masses palpated. Extremities show no edema. neuro grossly intact  ECG- personally reviewed  A/P  1  Kirk Ruths, MD

## 2017-03-14 NOTE — Assessment & Plan Note (Addendum)
Widespread PVD- AAA 4.2 cm, Lt > Rt severe SCA stenosis, occluded iliac at cath Oct 2017, bilateral CA disease s/p RCE in 2015, LCE '95, '97, and 1999- patent carotids by doppler April 2018- pt evaluated by Dr Donnetta Hutching 03/13/17- no further testing, only consider intervention for intractable symptoms.

## 2017-03-14 NOTE — Assessment & Plan Note (Signed)
EF 40-45% by echo Oct 2017 

## 2017-03-14 NOTE — Patient Instructions (Signed)
Medication Instructions:  START METOPROLOL SUCCINATE 12.5 MG DAILY (1/2 TAB) START IMDUR 15 MG DAILY (1/2 TAB)  If you need a refill on your cardiac medications before your next appointment, please call your pharmacy.  Follow-Up: Your physician wants you to follow-up in: 2 [place]     Thank you for choosing [facility]!!

## 2017-03-14 NOTE — Progress Notes (Signed)
03/14/2017 Linda Kelly   03-07-1928  [id number]  Primary Physician Quay Burow, Claudina Lick, MD Primary Cardiologist: Dr Stanford Breed  HPI:  83 y/o female with medically treated severe CAD, pulmonary fibrosis on chronic O2, and widespread PVD. She is seen in the office today as a follow up after recent office visit with Dr Donnetta Hutching. She recently complained of dizziness and some Lt arm pain. She has known supra aortic trunk occlusive disease and underwent CT angiogram for further evaluation.   This showed extremely severe diffuse disease in her arch vessels. On the right she has an 80% stenosis in her right subclavian artery at its origin and also an 80% stenosis in the vertebral artery on the right after its takeoff. On the left she has 50% stenosis of her common carotid artery at the origin. She has had prior carotid endarterectomy and there is a weblike stenosis below the endarterectomy site and also in the internal carotid artery above this. She has a 90% stenosis in her left subclavian takeoff from the arch and has complete occlusion of her left vertebral artery. Dr Early discussed this at length with the pt and her daughter. The pt reported she was actually doing pretty well. Dr Donnetta Hutching did not suggest she have any follow up studies. He would consider intervention for symptoms not controlled by medication.  The pt tells me she has occasional chest pian, she took a NTG a few weeks ago. Her main compliant is DOE and early fatigue.    Current Outpatient Prescriptions  Medication Sig Dispense Refill  . acetaminophen (TYLENOL) 650 MG CR tablet Take 650 mg by mouth daily.    Marland Kitchen ALPRAZolam (XANAX) 0.25 MG tablet Take 1 tablet (0.25 mg total) by mouth 3 (three) times daily as needed for anxiety. 30 tablet 0  . aspirin EC 81 MG tablet Take 81 mg by mouth at bedtime.     . cholecalciferol (VITAMIN D) 1000 UNITS tablet Take 1,000 Units by mouth at bedtime.     . clopidogrel (PLAVIX) 75 MG tablet Take 1 tablet (75 mg  total) by mouth daily. 90 tablet 1  . furosemide (LASIX) 40 MG tablet 1 tablet by mouth every morning and 1/2 tablet in the evening 45 tablet 6  . ipratropium-albuterol (DUONEB) 0.5-2.5 (3) MG/3ML SOLN Take 3 mLs by nebulization every 4 (four) hours as needed. 360 mL 4  . montelukast (SINGULAIR) 10 MG tablet Take 1 tablet (10 mg total) by mouth at bedtime. 90 tablet 3  . nitroGLYCERIN (NITROSTAT) 0.4 MG SL tablet Place 1 tablet (0.4 mg total) under the tongue every 5 (five) minutes x 3 doses as needed for chest pain. 25 tablet 3  . Omega-3 Fatty Acids (FISH OIL) 1000 MG CAPS Take 1,000 mg by mouth at bedtime.     . OXYGEN Inhale 2.5 L into the lungs continuous.    . potassium chloride (KLOR-CON 10) 10 MEQ tablet Take 0.5 tablets (5 mEq total) by mouth daily. 45 tablet 3  . predniSONE (DELTASONE) 20 MG tablet Take 1 tablet (20 mg total) by mouth daily with breakfast. 4 tablet 0  . ranitidine (ZANTAC) 150 MG tablet TAKE 1 TABLET BY MOUTH TWICE DAILY (Patient taking differently: Take 150 mg by mouth 2 (two) times daily. ) 180 tablet 3  . sertraline (ZOLOFT) 50 MG tablet Take 1 tablet (50 mg total) by mouth daily. 90 tablet 1  . simvastatin (ZOCOR) 40 MG tablet Take 1 tablet (40 mg total) by mouth daily.  90 tablet 3  . vitamin E 400 UNIT capsule Take 400 Units by mouth at bedtime.     . isosorbide mononitrate (IMDUR) 30 MG 24 hr tablet Take 0.5 tablets (15 mg total) by mouth daily. 15 tablet 5  . metoprolol succinate (TOPROL XL) 25 MG 24 hr tablet Take 0.5 tablets (12.5 mg total) by mouth daily. 15 tablet 5   No current facility-administered medications for this visit.     No Known Allergies  Past Medical History:  Diagnosis Date  . Anxiety   . Anxiety and depression   . Arthritis   . Benign neoplasm of esophagus   . CAD (coronary artery disease)   . Cancer [facility])    Skin cancer on back  . Carotid artery disease ([place])   . CHF (congestive heart failure) ([place])   . Chronic gastritis   .  Colon, diverticulosis   . COPD (chronic obstructive pulmonary disease) ([place])   . Depression   . GERD (gastroesophageal reflux disease)   . Hiatal hernia   . High frequency hearing loss   . Hyperlipidemia   . IBS (irritable bowel syndrome)   . Idiopathic pulmonary fibrosis   . Internal hemorrhoid   . MI (myocardial infarction) ([place]) 1985  . Other nonthrombocytopenic purpuras   . PONV (postoperative nausea and vomiting)   . PVD (peripheral vascular disease) ([place])   . Shortness of breath    with exertion  . Solitary pulmonary nodule   . Stroke [facility])    Hx: of several mini -strokes  . Tinnitus    chronic  . Vertigo     Social History   Social History  . Marital status: Widowed    Spouse name: N/A  . Number of children: 6  . Years of education: N/A   Occupational History  . 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 Smoking status: Former Smoker    Packs/day: 0.50    Years: 60.00    Types: Cigarettes    Quit date: 02/26/2014  . Smokeless tobacco: Never Used  . Alcohol use No  . Drug use: No  . Sexual activity: No   Other Topics Concern  . Not on file   Social History Narrative   [place] to [place] twice a year to visit sister- 3-4 weeks travel there every year.   Pt does not get regular exercise     Family History  Problem Relation Age of Onset  . Heart attack Father        deceased at 48, MGF  . Colon cancer Father   . Prostate cancer Father   . Heart disease Father   . Hyperlipidemia Father   . Heart disease Mother        deaceased at age 37  . Colitis Mother   . Stroke Mother   . Hyperlipidemia Mother   . Colitis Sister   . Hyperlipidemia Sister   . Other Brother        deceased age 62; war  . Hyperlipidemia Brother   . Breast cancer Maternal Aunt        great   . Lung cancer Paternal Aunt        great  . Vasculitis Son   . Hyperlipidemia Son   . Hypertension Son   .  Heart attack Son   . Cancer Sister        unknown  .  Hyperlipidemia Sister   . Hyperlipidemia Daughter   . Hypertension Daughter   . COPD Neg Hx   . Asthma Neg Hx      Review of Systems: General: negative for chills, fever, night sweats or weight changes.  Cardiovascular: negative for chest pain, dyspnea on exertion, edema, orthopnea, palpitations, paroxysmal nocturnal dyspnea or shortness of breath Dermatological: negative for rash Respiratory: negative for cough or wheezing Urologic: negative for hematuria Abdominal: negative for nausea, vomiting, diarrhea, bright red blood per rectum, melena, or hematemesis Neurologic: negative for visual changes, syncope, or dizziness All other systems reviewed and are otherwise negative except as noted above.    Blood pressure 121/73, pulse 97, height 4\' 10"  (1.473 m), weight 142 lb (64.4 kg).  General appearance: alert, cooperative, appears stated age, no distress and uses a walker, on O2 Neck: no carotid bruit and no JVD Lungs: fine crackles, no wheezing Heart: regular rate and rhythm Extremities: trace edema Neurologic: Grossly normal   ASSESSMENT AND PLAN:   CAD S/P percutaneous coronary angioplasty 1985 Remote PCI- Cath Oct 2017- severe CAD, not a candidate for PCI or CABG- plan medical Rx  PVD (peripheral vascular disease) ([place]) Widespread PVD- AAA 4.2 cm, Lt > Rt severe SCA stenosis, occluded iliac at cath Oct 2017, bilateral CA disease s/p RCE in 2015, LCE '95, '97, and 1999- patent carotids by doppler April 2018- pt evaluated by Dr Donnetta Hutching 03/13/17- no further testing, only consider intervention for intractable symptoms.  Pulmonary fibrosis ([place]) CT 11/28/2016 documents progression of interstitial disease  Ischemic cardiomyopathy EF 40-45% by echo Oct 2017   PLAN  I suggested we add some anti anginal Rx. If her chest pain was secondary to angina this may help, it could also help her exertional fatigue. I added Toprol 12.5 mg  and Imdur 15 mg. F/U Dr Stanford Breed in 2 months.   Kerin Ransom PA-C 03/14/2017 4:20 PM

## 2017-03-14 NOTE — Assessment & Plan Note (Signed)
CT 11/28/2016 documents progression of interstitial disease

## 2017-03-18 ENCOUNTER — Ambulatory Visit: Payer: Self-pay

## 2017-03-18 ENCOUNTER — Other Ambulatory Visit: Payer: Self-pay

## 2017-03-18 NOTE — Patient Outreach (Signed)
[facility]) Care Management  03/18/2017  Linda Kelly 03/01/1928 [id number]   Subjective: "I fell a little better".  Objective: none  Assessment: 83 year old with recent admission 3/8-3/14 for Heart failure, respiratory failure, healthcare acquired pneumonia. History of copd, MI, HTN, pulmonary fibrosis.  RNCM called to follow up. Client reports she has been to cardiologist, and taken prednisone prescribed by pulmonary. Client reports that Hospice came to her home and spoke with her and her family. She reports she has the contact number and will call them when she is ready.   RNCM also discussed resource available "Care Connections". Client thanked [facility] for the resource information.  RNCM encouraged client to call with questions or as needed.  RNCM also reinforced 24 hour nurse advice line availability.  No issues or concerns voiced. No care management needs identified at this time.  Plan: close case.  Thea Silversmith, RN, MSN, [place] Coordinator Cell: [phone]

## 2017-03-19 ENCOUNTER — Ambulatory Visit: Payer: PPO | Admitting: Cardiology

## 2017-03-20 ENCOUNTER — Telehealth: Payer: Self-pay | Admitting: Internal Medicine

## 2017-03-20 DIAGNOSIS — R2689 Other abnormalities of gait and mobility: Secondary | ICD-10-CM

## 2017-03-20 DIAGNOSIS — J8410 Pulmonary fibrosis, unspecified: Secondary | ICD-10-CM

## 2017-03-20 NOTE — Telephone Encounter (Signed)
Please advise on DX

## 2017-03-20 NOTE — Telephone Encounter (Signed)
Pt called stating she would like a walker with a seat [facility].

## 2017-03-20 NOTE — Telephone Encounter (Signed)
done

## 2017-03-21 NOTE — Telephone Encounter (Signed)
Message sent to [facility] with [facility] informing order was sent.

## 2017-03-23 ENCOUNTER — Other Ambulatory Visit: Payer: Self-pay | Admitting: Cardiology

## 2017-03-27 NOTE — Telephone Encounter (Signed)
Per [facility] Clopidogrel 75 mg tab is on formulary with no restrictions.

## 2017-03-29 ENCOUNTER — Other Ambulatory Visit: Payer: Self-pay | Admitting: Emergency Medicine

## 2017-03-29 MED ORDER — ALPRAZOLAM 0.25 MG PO TABS: 0.2500 mg | ORAL_TABLET | Freq: Three times a day (TID) | ORAL | 1 refills | Status: DC | PRN

## 2017-04-07 ENCOUNTER — Other Ambulatory Visit: Payer: Self-pay | Admitting: Internal Medicine

## 2017-04-07 DIAGNOSIS — J309 Allergic rhinitis, unspecified: Secondary | ICD-10-CM

## 2017-04-08 ENCOUNTER — Other Ambulatory Visit: Payer: Self-pay | Admitting: Physician Assistant

## 2017-04-08 ENCOUNTER — Other Ambulatory Visit: Payer: Self-pay | Admitting: Internal Medicine

## 2017-04-08 NOTE — Telephone Encounter (Signed)
Disp Refills Start End   metoprolol succinate (TOPROL XL) 25 MG 24 hr tablet 15 tablet 5 03/14/2017    Sig - Route: Take 0.5 tablets (12.5 mg total) by mouth daily. - Oral   E-Prescribing Status: Receipt confirmed by pharmacy (03/14/2017 4:20 PM EDT)   Pharmacy   [id number] - [place], [place] - [address] AT [address]

## 2017-04-08 NOTE — Telephone Encounter (Signed)
Please review for refill. Thanks!  

## 2017-04-11 ENCOUNTER — Ambulatory Visit (INDEPENDENT_AMBULATORY_CARE_PROVIDER_SITE_OTHER): Payer: Commercial Managed Care - PPO | Admitting: Internal Medicine

## 2017-04-11 ENCOUNTER — Encounter: Payer: Self-pay | Admitting: Internal Medicine

## 2017-04-11 DIAGNOSIS — J9611 Chronic respiratory failure with hypoxia: Secondary | ICD-10-CM

## 2017-04-11 DIAGNOSIS — I739 Peripheral vascular disease, unspecified: Secondary | ICD-10-CM | POA: Diagnosis not present

## 2017-04-11 DIAGNOSIS — J8410 Pulmonary fibrosis, unspecified: Secondary | ICD-10-CM

## 2017-04-11 MED ORDER — IPRATROPIUM-ALBUTEROL 0.5-2.5 (3) MG/3ML IN SOLN: 3.0000 mL | RESPIRATORY_TRACT | 12 refills | Status: DC | PRN

## 2017-04-11 NOTE — Assessment & Plan Note (Signed)
Oxygen dependent 3 L continuous. She would like to find a sufficiently light portable concentrator capable of maintaining 3 L but has been looking into this and realizes she may not find anything.

## 2017-04-11 NOTE — Assessment & Plan Note (Signed)
Diffuse significant atherosclerosis. She has a clear understanding from discussions with vascular surgery and cardiology and realizes they have nothing much more to offer

## 2017-04-11 NOTE — Progress Notes (Signed)
Patient ID: Linda Kelly, female    DOB: 1927/12/06, 83 y.o.   MRN: [id number]  HPI 06/07/11- 79 yoF 1/2 PPD smoker followed for pulmonary fibrosis/ ILD, COPD chronic hypoxic respiratory failure, complicated by CAD/ MI, PAD, hx lung nodule, h[place] exposure with long term f/u here.  CT chest 01/04/15- the pattern is most compatible with usual t interstitial pneumonia (UIP) CT chest 11/28/2016- Continued progression of fibrotic interstitial lung disease, Aortic atherosclerosis. Stable ectasia of the ascending thoracicaorta, maximum diameter 4.2 cm -----------------------------------------------------------------------------  11/29/2016-83 year old female former smoker followed for pulmonary fibrosis/ILD/UIP, COPD, CAD/history MI, PAD, history lung nodule, dizziness, [place] dust exposure with long-term follow-up here [facility] because of side effects O2 2 L sleep and as needed/ FOLLOWS FOR: Pt states that her SOB is worse than it was at her last visit. Pt uses O2 at 2 liters PRN -- pt uses when she has SOB and feeling tired. Here with daughter. Has oxygen at home but tries to go out without it. Anoro no help. Cough daily, mostly dry. CT chest 11/28/2016 IMPRESSION: 1. Continued progression of fibrotic interstitial lung disease characterized by confluent extensive subpleural reticulation and ground-glass attenuation, traction bronchiectasis and mild honeycombing, with a slight basilar predominance. These findings may be due to usual interstitial pneumonia (UIP), although the presence of mild-to-moderate air trapping raises the possibility of chronic hypersensitivity pneumonitis. 2. No acute consolidative airspace disease. Previously described nodular opacity in the medial basilar right upper lobe has resolved. No new or progressive pulmonary nodules. 3. Stable mild mediastinal lymphadenopathy, most consistent with benign reactive adenopathy. 4.  Aortic atherosclerosis. Stable ectasia of the ascending thoracic aorta, maximum diameter 4.2 cm. Recommend annual imaging followup by CTA or MRA. This recommendation follows 2010 ACCF/AHA/AATS/ACR/ASA/SCA/SCAI/SIR/STS/SVM Guidelines for the Diagnosis and Management of Patients with Thoracic Aortic Disease. Circulation. 2010; 121: N462-V035. 5. Left main and 3 vessel coronary atherosclerosis.  04/11/17- 83 year old female former smoker followed for pulmonary fibrosis/ILD/UIP, COPD, chronic hypoxic respiratory failure, CAD/history MI, PAD, history lung nodule, dizziness, [place] dust exposure with long-term follow-up here [facility] because of side effects O2 2 L sleep and as needed/ She was seen here in April after hospitalized with acute on chronic CHF complicated by bronchopneumonia. Oxygen was increased to 2.5 L and CXR was done. CT angio of neck in May had documented extensive thoracic and cervical atherosclerosis. Desaturated today on arrival to 82% on room air. hospitalization follow up  patient states that some days she feels good and some days she doesn't He admits breathing is somewhat better than when she was in the hospital in April. Takes off oxygen only briefly to shower and put on clothes but aware of dyspnea from room to room in her home wearing oxygen. Asks about portable concentrator's but understands they may not be better for continuous 3 L flow and her tank oxygen. Daughter is here. They have talked with hospice. She wishes DO NOT RESUSCITATE if no prospect to return to her current level. She understands from discussion with cardiology and vascular surgery that they don't have anything more to offer. She is not interested in trying OFEV. I did say that I could refer her to pulmonary at one of the universities at any point, but I honestly doubt that could offer much more. Using nebulizer 2-4 times daily. CXR 02/07/17 IMPRESSION: 1. Improved lung inflation  with resolution of left basilar infiltrate. 2. Fibrotic interstitial lung disease. 3. Aortic atherosclerosis.  Review of Systems-see HPI Constitutional:   No-   weight loss, night sweats, fevers, chills,+ fatigue, lassitude. HEENT:   No-  headaches, difficulty swallowing, tooth/dental problems, sore throat,       No-  sneezing, itching, ear ache, nasal congestion, post nasal drip,  CV:  No-   chest pain, orthopnea, PND, swelling in lower extremities, anasarca, dizziness, palpitations Resp: +shortness of breath with exertion or at rest.              No-   productive cough,   + non-productive cough,  No-  coughing up of blood.              No-   change in color of mucus.  No- wheezing.   Skin: No-   rash or lesions. GI:   heartburn, indigestion, no-abdominal pain, nausea, vomiting,  GU: MS:  No-   joint pain or swelling.  . Neuro- + dizziness Psych:  No- change in mood or affect. No depression or anxiety.  No memory loss.  Objective:   Physical Exam General- Alert, Oriented, Affect-appropriate, Distress- none acute,.    + Desaturated to 82% on room air on arrival  Skin- rash-none, lesions- none, excoriation- none Lymphadenopathy- none Head- atraumatic            Eyes- Gross vision intact, PERRLA, conjunctivae clear secretions            Ears- Hearing, canals normal            Nose- + mild stuffy, No-Septal dev, mucus, polyps, erosion, perforation             Throat- Mallampati II , mucosa clear , drainage- none, tonsils- atrophic;  dentures Neck- flexible , trachea midline, no stridor , thyroid nl, carotid +R bruit Chest - symmetrical excursion , unlabored           Heart/CV- RRR , no murmur , no gallop  , no rub, nl s1 s2                           - JVD- none , edema- none, stasis changes- none, varices- none           Lung- +crackles to scapulae, unlabored at rest,  wheeze- none, cough- none , dullness-none, rub- none, 94% RA           Chest wall-  Abd-  Br/ Gen/ Rectal- Not  done, not indicated Extrem- cyanosis- none, clubbing- none, atrophy- none, strength- nl Neuro- +seems alert and sharp at this visit

## 2017-04-11 NOTE — Patient Instructions (Signed)
We are continuing oxygen at 3L  Please call as needed

## 2017-04-11 NOTE — Assessment & Plan Note (Signed)
Continued progression demonstrated as recently as chest CT high resolution-11/28/16 She quit [name] because of side effects and doesn't want  Ofev

## 2017-05-06 ENCOUNTER — Telehealth: Payer: Self-pay | Admitting: Cardiology

## 2017-05-06 MED ORDER — FUROSEMIDE 40 MG PO TABS: 40.0000 mg | ORAL_TABLET | Freq: Every day | ORAL | 3 refills | Status: DC

## 2017-05-06 NOTE — Telephone Encounter (Signed)
Spoke with pt dtr, dose of furosemide confirmed and sent to the pharmacy electronically.

## 2017-05-06 NOTE — Telephone Encounter (Signed)
New message    Pt daughter is calling asking that a new prescription with new directions for pt lasix be sent to her pharmacy. She said pt now takes 1 pill twice a day and she used to take 1 pill in the morning and half a pill at night.

## 2017-05-08 ENCOUNTER — Telehealth: Payer: Self-pay

## 2017-05-08 MED ORDER — FUROSEMIDE 40 MG PO TABS: 40.0000 mg | ORAL_TABLET | Freq: Two times a day (BID) | ORAL | 3 refills | Status: DC

## 2017-05-08 NOTE — Telephone Encounter (Addendum)
Patients daughter walked in today, she had a concern about her moms medicine, Lasix. The patients daughter was concerned about the directions of Lasix patient is taking Lasix bid and the rx was sent for qd. I spoke with Hilda Blades, RN, ok'd me to resend Lasix bid to pharmacy.  Left message on daughters v/m. Called patient directly and notified her that rx was sent to the pharmacy. Patient voiced understanding.  Patients daughter called backed, informed her that Lasix bid rx ws sent to pharmacy and that I had already informed her mom. She voiced understanding.

## 2017-05-30 ENCOUNTER — Ambulatory Visit: Payer: Self-pay | Admitting: Internal Medicine

## 2017-06-11 ENCOUNTER — Ambulatory Visit: Payer: Self-pay | Admitting: Vascular Surgery

## 2017-06-11 ENCOUNTER — Encounter (HOSPITAL_COMMUNITY): Payer: Self-pay

## 2017-06-12 NOTE — Progress Notes (Signed)
HPI: FU carotid artery disease, CAD, HLD, pulmonary fibrosis. Her cardiac issues date back to 42 when she had an MI. She was treated with a balloon angioplasty at that time. Renal dopplers 12/12 showed stable 1-59 bilateral renal artery stenosis. Patient had a right carotid endarterectomy in May 2015. Patient was admitted with congestive heart failure October 2017. Echocardiogram October 2017 showed ejection fraction 51-88%, grade 2 diastolic dysfunction and mild to moderate mitral regurgitation. Cardiac catheterization October 2017 showed high-grade obstruction in the right iliac artery. There was severe multivessel coronary artery disease with occlusion of the right coronary artery, occlusion of the LAD, 99% distal circumflex and a very tortuous segment. Ejection fraction 35-45%. Medical therapy recommended. Surgery was not felt to be possible given her age and comorbidities. Chest CT January 2018 showed interstitial lung disease. Atherosclerosis noted and thoracic aortic aneurysm measuring 4.2 cm. she has been found to have diffuse disease in her arch vessels on prior CTA including 80% right subclavian and 80% right vertebral. There was a 90% left subclavian and occluded left vertebral. Seen by vascular surgery and medical therapy recommended. Since last seen, she does have dyspnea on exertion. She took nitroglycerin one time for chest pain.  Current Outpatient Prescriptions  Medication Sig Dispense Refill  . acetaminophen (TYLENOL) 650 MG CR tablet Take 650 mg by mouth daily.    Marland Kitchen ALPRAZolam (XANAX) 0.25 MG tablet Take 1 tablet (0.25 mg total) by mouth 3 (three) times daily as needed for anxiety. 30 tablet 1  . aspirin EC 81 MG tablet Take 81 mg by mouth at bedtime.     . cholecalciferol (VITAMIN D) 1000 UNITS tablet Take 1,000 Units by mouth at bedtime.     . clopidogrel (PLAVIX) 75 MG tablet Take 1 tablet (75 mg total) by mouth daily. 90 tablet 1  . furosemide (LASIX) 40 MG tablet Take 1  tablet (40 mg total) by mouth 2 (two) times daily. 60 tablet 3  . ipratropium-albuterol (DUONEB) 0.5-2.5 (3) MG/3ML SOLN Take 3 mLs by nebulization every 4 (four) hours as needed. 360 mL 12  . isosorbide mononitrate (IMDUR) 30 MG 24 hr tablet Take 0.5 tablets (15 mg total) by mouth daily. 15 tablet 5  . metoprolol succinate (TOPROL XL) 25 MG 24 hr tablet Take 0.5 tablets (12.5 mg total) by mouth daily. 15 tablet 5  . montelukast (SINGULAIR) 10 MG tablet Take 1 tablet (10 mg total) by mouth at bedtime. -- Office visit needed for further refills 90 tablet 0  . nitroGLYCERIN (NITROSTAT) 0.4 MG SL tablet Place 1 tablet (0.4 mg total) under the tongue every 5 (five) minutes x 3 doses as needed for chest pain. 25 tablet 3  . Omega-3 Fatty Acids (FISH OIL) 1000 MG CAPS Take 1,000 mg by mouth at bedtime.     . OXYGEN Inhale 2.5 L into the lungs continuous.    . potassium chloride (KLOR-CON 10) 10 MEQ tablet Take 0.5 tablets (5 mEq total) by mouth daily. 45 tablet 3  . predniSONE (DELTASONE) 20 MG tablet Take 1 tablet (20 mg total) by mouth daily with breakfast. 4 tablet 0  . ranitidine (ZANTAC) 150 MG tablet TAKE 1 TABLET BY MOUTH TWICE DAILY (Patient taking differently: Take 150 mg by mouth 2 (two) times daily. ) 180 tablet 3  . sertraline (ZOLOFT) 50 MG tablet Take 1 tablet (50 mg total) by mouth daily. 90 tablet 1  . simvastatin (ZOCOR) 40 MG tablet Take 1 tablet (40 mg  total) by mouth daily. --- Office visit needed for further refills 90 tablet 0  . vitamin E 400 UNIT capsule Take 400 Units by mouth at bedtime.      No current facility-administered medications for this visit.      Past Medical History:  Diagnosis Date  . Anxiety   . Anxiety and depression   . Arthritis   . Benign neoplasm of esophagus   . CAD (coronary artery disease)   . Cancer [facility])    Skin cancer on back  . Carotid artery disease ([place])   . CHF (congestive heart failure) ([place])   . Chronic gastritis   . Colon,  diverticulosis   . COPD (chronic obstructive pulmonary disease) ([place])   . Depression   . GERD (gastroesophageal reflux disease)   . Hiatal hernia   . High frequency hearing loss   . Hyperlipidemia   . IBS (irritable bowel syndrome)   . Idiopathic pulmonary fibrosis   . Internal hemorrhoid   . MI (myocardial infarction) ([place]) 1985  . Other nonthrombocytopenic purpuras   . PONV (postoperative nausea and vomiting)   . PVD (peripheral vascular disease) ([place])   . Shortness of breath    with exertion  . Solitary pulmonary nodule   . Stroke [facility])    Hx: of several mini -strokes  . Tinnitus    chronic  . Vertigo     Past Surgical History:  Procedure Laterality Date  . ANGIOPLASTY  1997, 98, 99  . BREAST SURGERY     Hx; of biopsy  . CARDIAC CATHETERIZATION N/A 08/10/2016   Procedure: Left Heart Cath and Coronary Angiography;  Surgeon: Belva Crome, MD;  Location: [place] [facility];  Service: Cardiovascular;  Laterality: N/A;  . CAROTID ENDARTERECTOMY     left side x 3, saphenous vein graft from left leg  . CATARACT EXTRACTION W/ INTRAOCULAR LENS  IMPLANT, BILATERAL    . COLON SURGERY    . COLONOSCOPY  11-27-01, 10-30-05, 05-24-11   diverticulosis, hemorrhoids  . DENTAL SURGERY     2012   . ENDARTERECTOMY Right 03/03/2014   Procedure: RIGHT CAROTID ENDARTERECTOMY WITH PATCH ANGIOPLASTY;  Surgeon: Rosetta Posner, MD;  Location: [place];  Service: Vascular;  Laterality: Right;  . FOOT SURGERY     bilateral  . SHOULDER SURGERY     left  . TOE SURGERY     right foot  . TONSILLECTOMY    . TUBAL LIGATION    . UPPER GASTROINTESTINAL ENDOSCOPY  11-27-01, 08-18-08   Hiatal hernia, benign esophagus neoplasia, gastritis, tortuous esophagus 44 Maloney dilation performed    Social History   Social History  . Marital status: Widowed    Spouse name: N/A  . Number of children: 6  . Years of education: N/A   Occupational History  . 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 Smoking status: Former Smoker    Packs/day: 0.50    Years: 60.00    Types: Cigarettes    Quit date: 02/26/2014  . Smokeless tobacco: Never Used  . Alcohol use No  . Drug use: No  . Sexual activity: No   Other Topics Concern  . Not on file   Social History Narrative   [place] to [place] twice a year to visit sister- 3-4 weeks travel there every year.   Pt does not get regular exercise  Family History  Problem Relation Age of Onset  . Heart attack Father        deceased at 66, MGF  . Colon cancer Father   . Prostate cancer Father   . Heart disease Father   . Hyperlipidemia Father   . Heart disease Mother        deaceased at age 61  . Colitis Mother   . Stroke Mother   . Hyperlipidemia Mother   . Colitis Sister   . Hyperlipidemia Sister   . Other Brother        deceased age 69; war  . Hyperlipidemia Brother   . Breast cancer Maternal Aunt        great   . Lung cancer Paternal Aunt        great  . Vasculitis Son   . Hyperlipidemia Son   . Hypertension Son   . Heart attack Son   . Cancer Sister        unknown  . Hyperlipidemia Sister   . Hyperlipidemia Daughter   . Hypertension Daughter   . COPD Neg Hx   . Asthma Neg Hx     ROS: recent depression but no fevers or chills, productive cough, hemoptysis, dysphasia, odynophagia, melena, hematochezia, dysuria, hematuria, rash, seizure activity, orthopnea, PND, pedal edema, claudication. Remaining systems are negative.  Physical Exam: Well-developed well-nourished in no acute distress.  Skin is warm and dry.  HEENT is normal.  Neck is supple.  Chest dry basilar crackles Cardiovascular exam is regular rate and rhythm.  Abdominal exam nontender or distended. No masses palpated. Extremities show no edema. neuro grossly intact  A/P  1 Coronary artery disease-continue aspirin, statin and Plavix.  2 hypertension-blood pressure is borderline. She has  some dizziness with standing. Discontinue isosorbide to allow higher blood pressure.  3 hyperlipidemia-continue statin.  4 carotid artery disease-followed by vascular surgery.  5 chronic combined systolic/diastolic congestive heart failure-she appears to be euvolemic on examination. We will continue with present dose of diuretics. Check K and renal function.   6 ischemic cardiomyopathy-continue beta blocker. We have not added an ARB or ACE inhibitor as patient does have a history of orthostasis and blood pressure borderline.  7 thoracic aortic aneurysm-mildly dilated on previous CT. She would not be a candidate for surgical intervention and therefore we will not pursue follow-up imaging.  8 Pulmonary fibrosis  Kirk Ruths, MD

## 2017-06-13 ENCOUNTER — Encounter: Payer: Self-pay | Admitting: Cardiology

## 2017-06-13 ENCOUNTER — Ambulatory Visit (INDEPENDENT_AMBULATORY_CARE_PROVIDER_SITE_OTHER): Payer: PPO | Admitting: Cardiology

## 2017-06-13 VITALS — BP 108/62 | HR 98 | Ht <= 58 in | Wt 143.0 lb

## 2017-06-13 DIAGNOSIS — I2510 Atherosclerotic heart disease of native coronary artery without angina pectoris: Secondary | ICD-10-CM | POA: Diagnosis not present

## 2017-06-13 DIAGNOSIS — E7800 Pure hypercholesterolemia, unspecified: Secondary | ICD-10-CM

## 2017-06-13 DIAGNOSIS — I10 Essential (primary) hypertension: Secondary | ICD-10-CM | POA: Diagnosis not present

## 2017-06-13 DIAGNOSIS — Z9861 Coronary angioplasty status: Secondary | ICD-10-CM | POA: Diagnosis not present

## 2017-06-13 DIAGNOSIS — I255 Ischemic cardiomyopathy: Secondary | ICD-10-CM | POA: Diagnosis not present

## 2017-06-13 NOTE — Patient Instructions (Signed)
Medication Instructions:   STOP ISOSORBIDE  Labwork:  Your physician recommends that you HAVE LAB WORK TODAY  Follow-Up:  Your physician wants you to follow-up in: [place] will receive a reminder letter in the mail two months in advance. If you don't receive a letter, please call our office to schedule the follow-up appointment.   If you need a refill on your cardiac medications before your next appointment, please call your pharmacy.

## 2017-06-14 LAB — BASIC METABOLIC PANEL
BUN/Creatinine Ratio: 10 — ABNORMAL LOW (ref 12–28)
BUN: 10 mg/dL (ref 8–27)
CALCIUM: 9.2 mg/dL (ref 8.7–10.3)
CO2: 29 mmol/L (ref 20–29)
CREATININE: 1 mg/dL (ref 0.57–1.00)
Chloride: 95 mmol/L — ABNORMAL LOW (ref 96–106)
GFR, EST AFRICAN AMERICAN: 59 mL/min/{1.73_m2} — AB (ref >59)
GFR, EST NON AFRICAN AMERICAN: 51 mL/min/{1.73_m2} — AB (ref >59)
Glucose: 94 mg/dL (ref 65–99)
POTASSIUM: 3.9 mmol/L (ref 3.5–5.2)
Sodium: 140 mmol/L (ref 134–144)

## 2017-06-21 ENCOUNTER — Telehealth: Payer: Self-pay | Admitting: Internal Medicine

## 2017-06-21 ENCOUNTER — Telehealth: Payer: Self-pay | Admitting: *Deleted

## 2017-06-21 NOTE — Telephone Encounter (Signed)
PA for furosemide faxed to the number provided.

## 2017-06-21 NOTE — Telephone Encounter (Signed)
Spoke with patient. She stated that her insurance is refusing to pay for her Lasix due to the lack of information from pulmonary. Advised patient that this RX originated from Dr. Jacalyn Lefevre office, not Dr. Annamaria Boots. She verbalized understanding. Nothing else needed at time of call.

## 2017-07-04 ENCOUNTER — Other Ambulatory Visit: Payer: Self-pay | Admitting: Internal Medicine

## 2017-07-04 DIAGNOSIS — J309 Allergic rhinitis, unspecified: Secondary | ICD-10-CM

## 2017-07-10 ENCOUNTER — Other Ambulatory Visit: Payer: Self-pay | Admitting: Internal Medicine

## 2017-07-10 DIAGNOSIS — J309 Allergic rhinitis, unspecified: Secondary | ICD-10-CM

## 2017-07-16 ENCOUNTER — Telehealth: Payer: Self-pay | Admitting: Internal Medicine

## 2017-07-16 DIAGNOSIS — J309 Allergic rhinitis, unspecified: Secondary | ICD-10-CM

## 2017-07-16 NOTE — Telephone Encounter (Signed)
Pt called regarding these refills. I told her that she was to follow up with Dr Quay Burow 6 months from her hospital follow up back in March. She said "I don't know why I need to keep coming in there for these medications that I have been on for years. All I am going to do is come in, she will talk to me for a few minutes and then send in my refills. Do you know how hard it is for me to come in there? I am on oxygen and I have a walker." I told her that she needs an appointment for these refills. She wanted to me tell you what she said and see if these can be sent in anyway. I told her that I would send it back but I didn't think they would be refilled. Please advise.

## 2017-07-16 NOTE — Telephone Encounter (Signed)
Please advise 

## 2017-07-16 NOTE — Telephone Encounter (Signed)
She really needs to be seen twice a year by me.  We can try to keep it at once a year, but needs to be seen at least once a year

## 2017-07-17 NOTE — Telephone Encounter (Signed)
Spoke with pt, she has an appt with Dr Quay Burow on Friday. She has enough medication until her appt.

## 2017-07-18 NOTE — Patient Instructions (Addendum)
  Flu immunization administered today.    Medications reviewed and updated.   No changes recommended at this time.  Your prescription(s) have been submitted to your pharmacy. Please take as directed and contact our office if you believe you are having problem(s) with the medication(s).    Please followup in 8 months

## 2017-07-18 NOTE — Progress Notes (Signed)
Subjective:    Patient ID: Linda Kelly, female    DOB: [birth year], 83 y.o.   MRN: [id number]  HPI The patient is here for follow up.  CAD, Hypertension, CHF: She is taking her medication daily. She is compliant with a low sodium diet.   She is not exercising regularly.  She does not monitor her blood pressure at home.    COPD, pulmonary fibrosis, chronic resp failure on oxygen:  She coughs every day. She has chronic SOB and occasional wheeze.  She does not feel her SOB is worse.  She has increased her oxygen since she was here last.   Anxiety, depression:  She is taking sertraline daily and xanax if needed. She feels her anxiety and depression are well controlled.   Hyperlipidemia: She is taking her medication daily. She is compliant with a low fat/cholesterol diet. She is not exercising regularly. She denies myalgias.    GERD:  She is taking her medication daily as prescribed.  She denies any GERD symptoms and feels her GERD is well controlled.      Medications and allergies reviewed with patient and updated if appropriate.  Patient Active Problem List   Diagnosis Date Noted  . Ischemic cardiomyopathy 03/14/2017  . Pain of left upper extremity 02/18/2017  . Steroid-induced hyperglycemia 01/08/2017  . Normocytic anemia 01/08/2017  . Leukocytosis 01/08/2017  . Anxiety state 01/08/2017  . HCAP (healthcare-associated pneumonia) 01/08/2017  . Increased oxygen demand   . Palliative care by specialist   . Encounter for hospice care discussion   . Palliative care encounter   . Goals of care, counseling/discussion   . Acute on chronic respiratory failure with hypoxia ([place]) 01/03/2017  . Demand ischemia ([place])   . Elevated troponin   . Chronic respiratory failure with hypoxia ([place]) 11/29/2016  . Episodic lightheadedness 08/28/2016  . Sleep difficulties 08/28/2016  . Chronic combined systolic (congestive) and diastolic (congestive) heart failure ([place])   . Pulmonary fibrosis ([place])    . Non-ST elevation (NSTEMI) myocardial infarction ([place]) 08/08/2016  . Lumbar radiculopathy 08/01/2016  . Left-sided low back pain without sciatica 03/28/2016  . Cephalalgia 03/10/2016  . Overactive bladder 02/21/2016  . Poor balance 02/21/2016  . Chronic lower back pain 02/21/2016  . Carotid stenosis-s/p CEA 2015 03/03/2014  . PVD (peripheral vascular disease) ([place]) 02/04/2014  . Change in bowel habits 05/22/2011  . URINARY INCONTINENCE 12/22/2010  . ABDOMINAL BRUIT 08/10/2010  . FASTING HYPERGLYCEMIA 11/15/2009  . Depression 08/01/2009  . GERD 08/01/2009  . IRRITABLE BOWEL SYNDROME 12/27/2008  . TINNITUS, CHRONIC 11/05/2008  . HEARING LOSS, HIGH FREQUENCY 11/05/2008  . HIATAL HERNIA 08/11/2008  . OTHER NONTHROMBOCYTOPENIC PURPURAS 06/08/2008  . ORTHOSTATIC HYPOTENSION 06/08/2008  . Lung nodule 11/20/2007  . OBESITY, MILD 11/19/2007  . Hyperlipidemia 10/09/2007  . Tobacco abuse, in remission 10/09/2007  . Essential hypertension 10/09/2007  . CAD S/P percutaneous coronary angioplasty 1985 10/09/2007  . COPD mixed type ([place]) 10/09/2007  . VERTIGO 03/31/2007  . Headache 03/31/2007    Current Outpatient Prescriptions on File Prior to Visit  Medication Sig Dispense Refill  . acetaminophen (TYLENOL) 650 MG CR tablet Take 650 mg by mouth daily.    Marland Kitchen ALPRAZolam (XANAX) 0.25 MG tablet Take 1 tablet (0.25 mg total) by mouth 3 (three) times daily as needed for anxiety. 30 tablet 1  . aspirin EC 81 MG tablet Take 81 mg by mouth at bedtime.     . cholecalciferol (VITAMIN D) 1000 UNITS tablet Take 1,000  Units by mouth at bedtime.     . clopidogrel (PLAVIX) 75 MG tablet Take 1 tablet (75 mg total) by mouth daily. 90 tablet 1  . furosemide (LASIX) 40 MG tablet Take 1 tablet (40 mg total) by mouth 2 (two) times daily. 60 tablet 3  . ipratropium-albuterol (DUONEB) 0.5-2.5 (3) MG/3ML SOLN Take 3 mLs by nebulization every 4 (four) hours as needed. 360 mL 12  . metoprolol succinate (TOPROL XL)  25 MG 24 hr tablet Take 0.5 tablets (12.5 mg total) by mouth daily. 15 tablet 5  . montelukast (SINGULAIR) 10 MG tablet Take 1 tablet (10 mg total) by mouth at bedtime. -- Office visit needed for further refills 90 tablet 0  . nitroGLYCERIN (NITROSTAT) 0.4 MG SL tablet Place 1 tablet (0.4 mg total) under the tongue every 5 (five) minutes x 3 doses as needed for chest pain. 25 tablet 3  . Omega-3 Fatty Acids (FISH OIL) 1000 MG CAPS Take 1,000 mg by mouth at bedtime.     . OXYGEN Inhale 2.5 L into the lungs continuous.    . potassium chloride (KLOR-CON 10) 10 MEQ tablet Take 0.5 tablets (5 mEq total) by mouth daily. 45 tablet 3  . predniSONE (DELTASONE) 20 MG tablet Take 1 tablet (20 mg total) by mouth daily with breakfast. 4 tablet 0  . ranitidine (ZANTAC) 150 MG tablet TAKE 1 TABLET BY MOUTH TWICE DAILY (Patient taking differently: Take 150 mg by mouth 2 (two) times daily. ) 180 tablet 3  . sertraline (ZOLOFT) 50 MG tablet Take 1 tablet (50 mg total) by mouth daily. 90 tablet 1  . simvastatin (ZOCOR) 40 MG tablet Take 1 tablet (40 mg total) by mouth daily. --- Office visit needed for further refills 90 tablet 0  . vitamin E 400 UNIT capsule Take 400 Units by mouth at bedtime.      No current facility-administered medications on file prior to visit.     Past Medical History:  Diagnosis Date  . Anxiety   . Anxiety and depression   . Arthritis   . Benign neoplasm of esophagus   . CAD (coronary artery disease)   . Cancer [facility])    Skin cancer on back  . Carotid artery disease ([place])   . CHF (congestive heart failure) ([place])   . Chronic gastritis   . Colon, diverticulosis   . COPD (chronic obstructive pulmonary disease) ([place])   . Depression   . GERD (gastroesophageal reflux disease)   . Hiatal hernia   . High frequency hearing loss   . Hyperlipidemia   . IBS (irritable bowel syndrome)   . Idiopathic pulmonary fibrosis   . Internal hemorrhoid   . MI (myocardial infarction) ([place]) 1985  .  Other nonthrombocytopenic purpuras   . PONV (postoperative nausea and vomiting)   . PVD (peripheral vascular disease) ([place])   . Shortness of breath    with exertion  . Solitary pulmonary nodule   . Stroke [facility])    Hx: of several mini -strokes  . Tinnitus    chronic  . Vertigo     Past Surgical History:  Procedure Laterality Date  . ANGIOPLASTY  1997, 98, 99  . BREAST SURGERY     Hx; of biopsy  . CARDIAC CATHETERIZATION N/A 08/10/2016   Procedure: Left Heart Cath and Coronary Angiography;  Surgeon: Belva Crome, MD;  Location: [facility];  Service: Cardiovascular;  Laterality: N/A;  . CAROTID ENDARTERECTOMY     left side x  3, saphenous vein graft from left leg  . CATARACT EXTRACTION W/ INTRAOCULAR LENS  IMPLANT, BILATERAL    . COLON SURGERY    . COLONOSCOPY  11-27-01, 10-30-05, 05-24-11   diverticulosis, hemorrhoids  . DENTAL SURGERY     2012   . ENDARTERECTOMY Right 03/03/2014   Procedure: RIGHT CAROTID ENDARTERECTOMY WITH PATCH ANGIOPLASTY;  Surgeon: Rosetta Posner, MD;  Location: [place];  Service: Vascular;  Laterality: Right;  . FOOT SURGERY     bilateral  . SHOULDER SURGERY     left  . TOE SURGERY     right foot  . TONSILLECTOMY    . TUBAL LIGATION    . UPPER GASTROINTESTINAL ENDOSCOPY  11-27-01, 08-18-08   Hiatal hernia, benign esophagus neoplasia, gastritis, tortuous esophagus 35 Maloney dilation performed    Social History   Social History  . Marital status: Widowed    Spouse name: N/A  . Number of children: 6  . Years of education: N/A   Occupational History  . 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 Smoking status: Former Smoker    Packs/day: 0.50    Years: 60.00    Types: Cigarettes    Quit date: 02/26/2014  . Smokeless tobacco: Never Used  . Alcohol use No  . Drug use: No  . Sexual activity: No   Other Topics Concern  . None   Social History Narrative   [employer], clinical   Goes  to [place] twice a year to visit sister- 3-4 weeks travel there every year.   Pt does not get regular exercise    Family History  Problem Relation Age of Onset  . Heart attack Father        deceased at 68, MGF  . Colon cancer Father   . Prostate cancer Father   . Heart disease Father   . Hyperlipidemia Father   . Heart disease Mother        deaceased at age 53  . Colitis Mother   . Stroke Mother   . Hyperlipidemia Mother   . Colitis Sister   . Hyperlipidemia Sister   . Other Brother        deceased age 60; war  . Hyperlipidemia Brother   . Breast cancer Maternal Aunt        great   . Lung cancer Paternal Aunt        great  . Vasculitis Son   . Hyperlipidemia Son   . Hypertension Son   . Heart attack Son   . Cancer Sister        unknown  . Hyperlipidemia Sister   . Hyperlipidemia Daughter   . Hypertension Daughter   . COPD Neg Hx   . Asthma Neg Hx     Review of Systems  Constitutional: Positive for appetite change (decreased). Negative for chills and fever.  HENT: Positive for sneezing.   Respiratory: Positive for cough, shortness of breath and wheezing.   Cardiovascular: Positive for chest pain (transient) and palpitations. Negative for leg swelling.  Gastrointestinal: Positive for diarrhea (episodic, lasts couple of days). Negative for abdominal pain (cramping with bowel movements) and nausea.  Endocrine: Positive for cold intolerance.  Genitourinary: Positive for hematuria.       Objective:   Vitals:   07/19/17 1048  BP: 130/90  Pulse: 83  Temp: (!) 97.5 F (36.4 C)  SpO2: 98%   Wt Readings from  Last 3 Encounters:  07/19/17 134 lb 8 oz (61 kg)  06/13/17 143 lb (64.9 kg)  04/11/17 141 lb (64 kg)   Body mass index is 28.11 kg/m.   Physical Exam    Constitutional: chronically ill appearing on oxygen. No distress.  HENT:  Head: Normocephalic and atraumatic.  Neck: Neck supple. No tracheal deviation present. No thyromegaly present.  No cervical  lymphadenopathy Cardiovascular: Normal rate, regular rhythm and normal heart sounds.   No murmur heard. No carotid bruit .  No edema Pulmonary/Chest: Effort normal.  Diffusely decreased breath sounds normal. No respiratory distress. No has no wheezes. Mild basilar rales.  Skin: Skin is warm and dry. Not diaphoretic.  Psychiatric: Normal mood and affect. Behavior is normal.      Assessment & Plan:    See Problem List for Assessment and Plan of chronic medical problems.

## 2017-07-19 ENCOUNTER — Ambulatory Visit (INDEPENDENT_AMBULATORY_CARE_PROVIDER_SITE_OTHER): Payer: PPO | Admitting: Internal Medicine

## 2017-07-19 ENCOUNTER — Encounter: Payer: Self-pay | Admitting: Internal Medicine

## 2017-07-19 ENCOUNTER — Other Ambulatory Visit: Payer: Self-pay | Admitting: Internal Medicine

## 2017-07-19 VITALS — BP 130/90 | HR 83 | Temp 97.5°F | Ht <= 58 in | Wt 134.5 lb

## 2017-07-19 DIAGNOSIS — Z23 Encounter for immunization: Secondary | ICD-10-CM | POA: Diagnosis not present

## 2017-07-19 DIAGNOSIS — F411 Generalized anxiety disorder: Secondary | ICD-10-CM | POA: Diagnosis not present

## 2017-07-19 DIAGNOSIS — J9611 Chronic respiratory failure with hypoxia: Secondary | ICD-10-CM

## 2017-07-19 DIAGNOSIS — F32A Depression, unspecified: Secondary | ICD-10-CM

## 2017-07-19 DIAGNOSIS — I10 Essential (primary) hypertension: Secondary | ICD-10-CM

## 2017-07-19 DIAGNOSIS — J8410 Pulmonary fibrosis, unspecified: Secondary | ICD-10-CM

## 2017-07-19 DIAGNOSIS — K219 Gastro-esophageal reflux disease without esophagitis: Secondary | ICD-10-CM

## 2017-07-19 DIAGNOSIS — J309 Allergic rhinitis, unspecified: Secondary | ICD-10-CM | POA: Diagnosis not present

## 2017-07-19 DIAGNOSIS — F329 Major depressive disorder, single episode, unspecified: Secondary | ICD-10-CM | POA: Diagnosis not present

## 2017-07-19 MED ORDER — SIMVASTATIN 40 MG PO TABS: 40.0000 mg | ORAL_TABLET | Freq: Every day | ORAL | 3 refills | Status: DC

## 2017-07-19 MED ORDER — ALPRAZOLAM 0.25 MG PO TABS: 0.2500 mg | ORAL_TABLET | Freq: Three times a day (TID) | ORAL | 2 refills | Status: DC | PRN

## 2017-07-19 MED ORDER — MONTELUKAST SODIUM 10 MG PO TABS: 10.0000 mg | ORAL_TABLET | Freq: Every day | ORAL | 3 refills | Status: DC

## 2017-07-19 NOTE — Assessment & Plan Note (Signed)
Stable on 3.5L of oxygen Chronic SOB, cough, wheeze - no change Continue inhalers

## 2017-07-19 NOTE — Assessment & Plan Note (Signed)
Controlled, stable Continue current dose of medication  

## 2017-07-19 NOTE — Assessment & Plan Note (Signed)
On chronic oxygen - 3.5L . Min on [place] Continue inhalers

## 2017-07-19 NOTE — Assessment & Plan Note (Signed)
GERD controlled Continue daily medication  

## 2017-07-19 NOTE — Assessment & Plan Note (Signed)
BP well controlled Current regimen effective and well tolerated Continue current medications at current doses  

## 2017-08-12 ENCOUNTER — Ambulatory Visit: Payer: Self-pay | Admitting: Internal Medicine

## 2017-08-15 ENCOUNTER — Ambulatory Visit: Payer: Self-pay | Admitting: Internal Medicine

## 2017-08-19 ENCOUNTER — Telehealth: Payer: Self-pay | Admitting: Cardiology

## 2017-08-19 NOTE — Telephone Encounter (Signed)
New message    Pt is calling asking for a call back. She said she needs a new order for oxygen. Please call.

## 2017-08-19 NOTE — Telephone Encounter (Signed)
Returned the call to the patient. She stated that she is currently on 2.5 liters of oxygen and needs 3.5 liters. Her current machine cannot handle 3.5 liters. According to her last PCP visit in September, the patient had increased her O2 to 3.5 liters. She stated that she had been instructed to call her cardiologist to have a new order placed. Will route to the physician for his recommendation.

## 2017-08-19 NOTE — Telephone Encounter (Signed)
Appears pulmonary is managing her home [place]

## 2017-08-20 ENCOUNTER — Telehealth: Payer: Self-pay | Admitting: Internal Medicine

## 2017-08-20 NOTE — Telephone Encounter (Signed)
Left message for [name] to call back. It appears that the PCP requested her O2 to be increased, not pulmonary.

## 2017-08-20 NOTE — Telephone Encounter (Signed)
Spoke with pt, aware she will need to contact dr young

## 2017-08-21 ENCOUNTER — Telehealth: Payer: Self-pay | Admitting: Internal Medicine

## 2017-08-21 NOTE — Telephone Encounter (Signed)
Advised pt that she has to call her PCP to change order. Pt understood and nothing further is needed.

## 2017-08-21 NOTE — Telephone Encounter (Signed)
She self increased oxygen from 3 L / min to 3.5 L/ min due to oxygen level dropping at home.
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
## 2017-08-21 NOTE — Telephone Encounter (Signed)
Pt called regarding her O2, please refer to telephone note on 10/22 from pulmonary, patient needs a new order for her O2 to be increased but the tank she has does not go that high.  Please advise and call back patient

## 2017-08-21 NOTE — Telephone Encounter (Signed)
Should this not be addressed by Pulm?

## 2017-08-22 ENCOUNTER — Telehealth: Payer: Self-pay | Admitting: Internal Medicine

## 2017-08-22 DIAGNOSIS — J8410 Pulmonary fibrosis, unspecified: Secondary | ICD-10-CM

## 2017-08-22 DIAGNOSIS — J9611 Chronic respiratory failure with hypoxia: Secondary | ICD-10-CM

## 2017-08-22 DIAGNOSIS — J449 Chronic obstructive pulmonary disease, unspecified: Secondary | ICD-10-CM

## 2017-08-22 NOTE — Telephone Encounter (Signed)
Pt states she needs a new concentrator in home to go up to at least 3.5lpm as this is the liter flow Dr. Quay Burow suggested for her.    Pt is aware that I have placed this order and [facility] will reach out to her. CY is aware to sign order. Pt has pending OV for follow up on Wednesday 09-04-17 at 11:30am. Nothing more needed at this time.

## 2017-08-22 NOTE — Telephone Encounter (Signed)
Please call daughter to discuss

## 2017-08-22 NOTE — Telephone Encounter (Signed)
Tried contacting pts daughter, was told that it was the wrong number. Contacted the patient and was unable to talk to pt and explain what needed to be done due to patient screaming in the phone. Advised pt I would be happy to help but she would need to stop screaming at me. Pt gave me her daughters number. Contacted the daughter and advised that pt is due for OV with Pulm and they would need to do the proper testing to get O2 changed.   Contacted Dr Bertrum Sol assistant and she is going to contact pt.

## 2017-08-22 NOTE — Telephone Encounter (Signed)
Pts daughter called checking on this. Please advise.

## 2017-08-26 ENCOUNTER — Other Ambulatory Visit: Payer: Self-pay | Admitting: Cardiology

## 2017-08-28 ENCOUNTER — Other Ambulatory Visit: Payer: Self-pay | Admitting: Internal Medicine

## 2017-09-04 ENCOUNTER — Other Ambulatory Visit (INDEPENDENT_AMBULATORY_CARE_PROVIDER_SITE_OTHER): Payer: Commercial Managed Care - PPO

## 2017-09-04 ENCOUNTER — Encounter: Payer: Self-pay | Admitting: Internal Medicine

## 2017-09-04 ENCOUNTER — Ambulatory Visit (INDEPENDENT_AMBULATORY_CARE_PROVIDER_SITE_OTHER): Payer: Commercial Managed Care - PPO | Admitting: Internal Medicine

## 2017-09-04 VITALS — BP 126/80 | HR 87 | Ht <= 58 in | Wt 141.8 lb

## 2017-09-04 DIAGNOSIS — R51 Headache: Secondary | ICD-10-CM

## 2017-09-04 DIAGNOSIS — J449 Chronic obstructive pulmonary disease, unspecified: Secondary | ICD-10-CM

## 2017-09-04 DIAGNOSIS — J9611 Chronic respiratory failure with hypoxia: Secondary | ICD-10-CM | POA: Diagnosis not present

## 2017-09-04 DIAGNOSIS — R519 Headache, unspecified: Secondary | ICD-10-CM

## 2017-09-04 LAB — CBC WITH DIFFERENTIAL/PLATELET
BASOS ABS: 0.1 10*3/uL (ref 0.0–0.1)
Basophils Relative: 0.5 % (ref 0.0–3.0)
Eosinophils Absolute: 0.4 10*3/uL (ref 0.0–0.7)
Eosinophils Relative: 3.4 % (ref 0.0–5.0)
HCT: 37.1 % (ref 36.0–46.0)
Hemoglobin: 12 g/dL (ref 12.0–15.0)
LYMPHS ABS: 1.9 10*3/uL (ref 0.7–4.0)
Lymphocytes Relative: 15.6 % (ref 12.0–46.0)
MCHC: 32.4 g/dL (ref 30.0–36.0)
MCV: 94.9 fl (ref 78.0–100.0)
MONOS PCT: 11.5 % (ref 3.0–12.0)
Monocytes Absolute: 1.4 10*3/uL — ABNORMAL HIGH (ref 0.1–1.0)
NEUTROS ABS: 8.3 10*3/uL — AB (ref 1.4–7.7)
NEUTROS PCT: 69 % (ref 43.0–77.0)
PLATELETS: 226 10*3/uL (ref 150.0–400.0)
RBC: 3.91 Mil/uL (ref 3.87–5.11)
RDW: 15.6 % — ABNORMAL HIGH (ref 11.5–15.5)
WBC: 12.1 10*3/uL — ABNORMAL HIGH (ref 4.0–10.5)

## 2017-09-04 LAB — SEDIMENTATION RATE: Sed Rate: 33 mm/hr — ABNORMAL HIGH (ref 0–30)

## 2017-09-04 NOTE — Assessment & Plan Note (Signed)
Pain left temple.  I asked her to see her PCP about this.  She is weak but no obvious focal neurologic event.  We can check for temporal arteritis with sed rate and CBC while she is here, but I do not suspect acute sinusitis or other issue directly related to her respiratory status at this visit.

## 2017-09-04 NOTE — Progress Notes (Signed)
Patient ID: NATHALI VENT, female    DOB: [birth year], 83 y.o.   MRN: [id number]  HPI 06/07/11- 34 yoF former smoker followed for pulmonary fibrosis/ UIP, COPD chronic hypoxic respiratory failure, complicated by CAD/ MI, PAD, hx lung nodule, h[place] exposure with long term f/u here.  CT chest 01/04/15- the pattern is most compatible with usual  interstitial pneumonia (UIP) CT chest 11/28/2016- Continued progression of fibrotic interstitial lung disease, Aortic atherosclerosis. Stable ectasia of the ascending thoracicaorta, maximum diameter 4.2 cm -----------------------------------------------------------------------------  04/11/17- 83 year old female former smoker followed for pulmonary fibrosis/ILD/UIP, COPD, chronic hypoxic respiratory failure, CAD/history MI, PAD, history lung nodule, dizziness, [place] dust exposure with long-term follow-up here [employer] because of side effects O2 2 L sleep and as needed/ She was seen here in April after hospitalized with acute on chronic CHF complicated by bronchopneumonia. Oxygen was increased to 2.5 L and CXR was done. CT angio of neck in May had documented extensive thoracic and cervical atherosclerosis. Desaturated today on arrival to 82% on room air. hospitalization follow up  patient states that some days she feels good and some days she doesn't He admits breathing is somewhat better than when she was in the hospital in April. Takes off oxygen only briefly to shower and put on clothes but aware of dyspnea from room to room in her home wearing oxygen. Asks about portable concentrators but understands they may not be better for continuous 3 L flow than her tank oxygen. Daughter is here. They have talked with hospice. She wishes DO NOT RESUSCITATE if no prospect to return to her current level. She understands from discussion with cardiology and vascular surgery that they don't have anything more to offer. She is  not interested in trying OFEV. I did say that I could refer her to pulmonary at one of the universities at any point, but I honestly doubt that could offer much more. Using nebulizer 2-4 times daily. CXR 02/07/17 IMPRESSION: 1. Improved lung inflation with resolution of left basilar infiltrate. 2. Fibrotic interstitial lung disease. 3. Aortic atherosclerosis.  09/04/17- 83 year old female former smoker followed for pulmonary fibrosis/ILD/UIP, COPD, chronic hypoxic respiratory failure, CAD/history MI, PAD, history lung nodule, dizziness, [place] dust exposure with long-term follow-up here [employer] because of side effects- doesn't want to try Ofev. O2 3 L /Advanced -COPD mixed type-pt continues to wear O2 but has recently noted right side of head hurting with pain that does not go away. Pain has been there for past several days. - Here with daughter.  Has had left temple pain times 3 days.  May have begun with some soreness behind the left eye which has improved.  No definite change in vision or hearing.  Malaise and some unsteadiness without vertigo.  No trauma.  Mild nasal stuffiness from oxygen unchanged.  No chest pain or abdominal upset. Has had flu vaccine.  Review of Systems-see HPI + = positive Constitutional:   No-   weight loss, night sweats, fevers, chills,+ fatigue, lassitude. HEENT:   No-  headaches, difficulty swallowing, tooth/dental problems, sore throat,       No-  sneezing, itching, ear ache, nasal congestion, post nasal drip,  CV:  No-   chest pain, orthopnea, PND, swelling in lower extremities, anasarca, dizziness, palpitations Resp: +shortness of breath with exertion or at rest.              No-   productive cough,   +  non-productive cough,  No-  coughing up of blood.              No-   change in color of mucus.  No- wheezing.   Skin: No-   rash or lesions. GI:   heartburn, indigestion, no-abdominal pain, nausea, vomiting,  GU: MS:  No-   joint  pain or swelling.  . Neuro- + dizziness,  Psych:  No- change in mood or affect. No depression or anxiety.  No memory loss.  Objective:   Physical Exam General- Alert, Oriented, Affect-appropriate, Distress- none acute,.   O2 Skin- rash-none, lesions- none, excoriation- none Lymphadenopathy- none Head- atraumatic              left temporal artery is not obviously distended            Eyes- Gross vision intact, PERRLA, conjunctivae clear secretions            Ears- Hearing, canals normal            Nose- + mild stuffy, No-Septal dev, mucus, polyps, erosion, perforation             Throat- Mallampati II , mucosa clear , drainage- none, tonsils- atrophic;  dentures Neck- flexible , trachea midline, no stridor , thyroid nl, carotid +R bruit Chest - symmetrical excursion , unlabored           Heart/CV- RRR , no murmur , no gallop  , no rub, nl s1 s2                           - JVD- none , edema- none, stasis changes- none, varices- none           Lung- +crackles to scapulae, unlabored at rest,  wheeze- none, cough- none , dullness-none, rub- none, 94% RA           Chest wall-  Abd-  Br/ Gen/ Rectal- Not done, not indicated Extrem- cyanosis- none, clubbing- none, atrophy- none, strength- weak Neuro- +seems weak but interactive.  Nonfocal.  Speech clear.

## 2017-09-04 NOTE — Assessment & Plan Note (Signed)
She remains dependent on continuous oxygen.  No change.

## 2017-09-04 NOTE — Patient Instructions (Signed)
Order- lab-   CBC w diff, sed rate    Dx headache  Try aspirin or ibuprofen for headache. If you don't get better quickly, please check with Dr Quay Burow.  Please call if I can help

## 2017-09-04 NOTE — Assessment & Plan Note (Signed)
She has not had a significant change in chest symptoms since last here.  Continues current meds.

## 2017-09-10 ENCOUNTER — Telehealth: Payer: Self-pay | Admitting: Internal Medicine

## 2017-09-10 NOTE — Telephone Encounter (Signed)
Called pt and advised message from the provider. Pt understood and verbalized understanding. Nothing further is needed.    

## 2017-09-10 NOTE — Telephone Encounter (Signed)
Labs- CBC showed white blood cell count elevated as usual, 12,000. This is not as high as sometimes, but it stays elevated over most of the times it has been checked. Steroids can do this, but not on any prednisone now. Infections can do this, and there can be other reasons. The sedimentation rate test was a little elevated, which can indicate inflammation. You didn't seem to have an infection when you were here recently. I do want you to see your primary physician about the headache and lab results.

## 2017-09-10 NOTE — Telephone Encounter (Signed)
[facility] - Client [facility]     Patient Name: LOUIS Sochacki Initial Comment Caller states her mother has pain in her head and it will not go away. It will go away for a couple hours with some ibuprofen. It has been like this for about a week.  DOB: 1928-02-07      Nurse Assessment  Nurse: Hassell Done, RN, Melanie Date/Time (Eastern Time): 09/10/2017 4:59:22 PM  Confirm and document reason for call. If symptomatic, describe symptoms. ---Caller states she has a pain in her head- sort of wanders around but mostly on L side. Went to see Dr Annamaria Boots and he had blood work done. Nurse said something about an infection. Caller states she wants to make an appointment with.  Does the patient have any new or worsening symptoms? ---No  Please document clinical information provided and list any resource used. ---Caller was interested in making an appointment and not in being triaged for headache.    Guidelines     Guideline Title Affirmed Question Affirmed Notes        Final Disposition User   Clinical Call Hassell Done, RN, Melanie   Comments   Caller planned to call tomorrow and make appointment to be seen about pain in her head . She was following instructions of her PCP until she could be seen.

## 2017-09-10 NOTE — Telephone Encounter (Signed)
Spoke with pt, she states she would like the results from her blood work. CY please advise on lab results.

## 2017-09-11 ENCOUNTER — Encounter: Payer: Self-pay | Admitting: Internal Medicine

## 2017-09-11 ENCOUNTER — Ambulatory Visit (INDEPENDENT_AMBULATORY_CARE_PROVIDER_SITE_OTHER): Payer: Commercial Managed Care - PPO | Admitting: Internal Medicine

## 2017-09-11 DIAGNOSIS — R51 Headache: Secondary | ICD-10-CM

## 2017-09-11 DIAGNOSIS — R519 Headache, unspecified: Secondary | ICD-10-CM | POA: Insufficient documentation

## 2017-09-11 MED ORDER — METHYLPREDNISOLONE ACETATE 80 MG/ML IJ SUSP
80.0000 mg | Freq: Once | INTRAMUSCULAR | Status: AC
  Administered 2017-09-11: 80 mg via INTRAMUSCULAR

## 2017-09-11 NOTE — Patient Instructions (Signed)
You received a steroid injection today.   Stop taking ibuprofen.  Take tylenol for your headache  If you headache does not improve we need to get a Ct scan of your head and consider starting gabapentin for the pain.    Call if no improvement

## 2017-09-11 NOTE — Progress Notes (Signed)
Subjective:    Patient ID: Linda Kelly, female    DOB: [birth year], 83 y.o.   MRN: [id number]  HPI She is here for an acute visit.   Pain in head:  At first she thought it was an ear ache.  It sometimes runs the whole left side of her head.  It started a couple of weeks ago and has been daily.   She has taken 400 mg of advil and it goes away for a few hours.  She has taken advil a few times a day.  She denies numbness/tingling.  The pain is a soft pain, not sharp. She denies acute changes in her vision.  She does get some nausea, but is unsure if it is from the headache or not..    She denies falls.  She is not sleeping due to the headache.     She does not get headaches so this is new for her.    Medications and allergies reviewed with patient and updated if appropriate.  Patient Active Problem List   Diagnosis Date Noted  . Ischemic cardiomyopathy 03/14/2017  . Pain of left upper extremity 02/18/2017  . Steroid-induced hyperglycemia 01/08/2017  . Normocytic anemia 01/08/2017  . Leukocytosis 01/08/2017  . Anxiety state 01/08/2017  . HCAP (healthcare-associated pneumonia) 01/08/2017  . Increased oxygen demand   . Palliative care by specialist   . Encounter for hospice care discussion   . Palliative care encounter   . Goals of care, counseling/discussion   . Demand ischemia ([place])   . Elevated troponin   . Chronic respiratory failure with hypoxia ([place]) 11/29/2016  . Episodic lightheadedness 08/28/2016  . Sleep difficulties 08/28/2016  . Chronic combined systolic (congestive) and diastolic (congestive) heart failure ([place])   . Pulmonary fibrosis ([place])   . Non-ST elevation (NSTEMI) myocardial infarction ([place]) 08/08/2016  . Lumbar radiculopathy 08/01/2016  . Left-sided low back pain without sciatica 03/28/2016  . Cephalalgia 03/10/2016  . Overactive bladder 02/21/2016  . Poor balance 02/21/2016  . Chronic lower back pain 02/21/2016  . Carotid stenosis-s/p CEA 2015  03/03/2014  . PVD (peripheral vascular disease) ([place]) 02/04/2014  . Change in bowel habits 05/22/2011  . URINARY INCONTINENCE 12/22/2010  . ABDOMINAL BRUIT 08/10/2010  . FASTING HYPERGLYCEMIA 11/15/2009  . Depression 08/01/2009  . GERD 08/01/2009  . IRRITABLE BOWEL SYNDROME 12/27/2008  . TINNITUS, CHRONIC 11/05/2008  . HEARING LOSS, HIGH FREQUENCY 11/05/2008  . HIATAL HERNIA 08/11/2008  . OTHER NONTHROMBOCYTOPENIC PURPURAS 06/08/2008  . ORTHOSTATIC HYPOTENSION 06/08/2008  . Lung nodule 11/20/2007  . OBESITY, MILD 11/19/2007  . Hyperlipidemia 10/09/2007  . Tobacco abuse, in remission 10/09/2007  . Essential hypertension 10/09/2007  . CAD S/P percutaneous coronary angioplasty 1985 10/09/2007  . COPD mixed type ([place]) 10/09/2007  . VERTIGO 03/31/2007  . Headache 03/31/2007    Current Outpatient Medications on File Prior to Visit  Medication Sig Dispense Refill  . acetaminophen (TYLENOL) 650 MG CR tablet Take 650 mg by mouth daily.    Marland Kitchen ALPRAZolam (XANAX) 0.25 MG tablet Take 1 tablet (0.25 mg total) by mouth 3 (three) times daily as needed for anxiety. 30 tablet 2  . aspirin EC 81 MG tablet Take 81 mg by mouth at bedtime.     . cholecalciferol (VITAMIN D) 1000 UNITS tablet Take 1,000 Units by mouth at bedtime.     . clopidogrel (PLAVIX) 75 MG tablet TAKE 1 TABLET BY MOUTH EVERY DAY 90 tablet 1  . furosemide (LASIX) 40 MG tablet TAKE  1 TABLET BY MOUTH TWICE A DAY 60 tablet 3  . ipratropium-albuterol (DUONEB) 0.5-2.5 (3) MG/3ML SOLN Take 3 mLs by nebulization every 4 (four) hours as needed. 360 mL 12  . metoprolol succinate (TOPROL XL) 25 MG 24 hr tablet Take 0.5 tablets (12.5 mg total) by mouth daily. 15 tablet 5  . montelukast (SINGULAIR) 10 MG tablet Take 1 tablet (10 mg total) by mouth at bedtime. 90 tablet 3  . nitroGLYCERIN (NITROSTAT) 0.4 MG SL tablet Place 1 tablet (0.4 mg total) under the tongue every 5 (five) minutes x 3 doses as needed for chest pain. 25 tablet 3  . Omega-3  Fatty Acids (FISH OIL) 1000 MG CAPS Take 1,000 mg by mouth at bedtime.     . OXYGEN Inhale 2.5 L into the lungs continuous.    . potassium chloride (KLOR-CON 10) 10 MEQ tablet Take 0.5 tablets (5 mEq total) by mouth daily. 45 tablet 3  . ranitidine (ZANTAC) 150 MG tablet TAKE 1 TABLET BY MOUTH TWICE DAILY (Patient taking differently: Take 150 mg by mouth 2 (two) times daily. ) 180 tablet 3  . sertraline (ZOLOFT) 50 MG tablet Take 1 tablet (50 mg total) by mouth daily. 90 tablet 1  . simvastatin (ZOCOR) 40 MG tablet Take 1 tablet (40 mg total) by mouth daily. 90 tablet 3  . vitamin E 400 UNIT capsule Take 400 Units by mouth at bedtime.      No current facility-administered medications on file prior to visit.     Past Medical History:  Diagnosis Date  . Anxiety   . Anxiety and depression   . Arthritis   . Benign neoplasm of esophagus   . CAD (coronary artery disease)   . Cancer [facility])    Skin cancer on back  . Carotid artery disease ([place])   . CHF (congestive heart failure) ([place])   . Chronic gastritis   . Colon, diverticulosis   . COPD (chronic obstructive pulmonary disease) ([place])   . Depression   . GERD (gastroesophageal reflux disease)   . Hiatal hernia   . High frequency hearing loss   . Hyperlipidemia   . IBS (irritable bowel syndrome)   . Idiopathic pulmonary fibrosis   . Internal hemorrhoid   . MI (myocardial infarction) ([place]) 1985  . Other nonthrombocytopenic purpuras   . PONV (postoperative nausea and vomiting)   . PVD (peripheral vascular disease) ([place])   . Shortness of breath    with exertion  . Solitary pulmonary nodule   . Stroke [facility])    Hx: of several mini -strokes  . Tinnitus    chronic  . Vertigo     Past Surgical History:  Procedure Laterality Date  . ANGIOPLASTY  1997, 98, 99  . BREAST SURGERY     Hx; of biopsy  . CAROTID ENDARTERECTOMY     left side x 3, saphenous vein graft from left leg  . CATARACT EXTRACTION W/ INTRAOCULAR LENS  IMPLANT,  BILATERAL    . COLON SURGERY    . COLONOSCOPY  11-27-01, 10-30-05, 05-24-11   diverticulosis, hemorrhoids  . DENTAL SURGERY     2012   . FOOT SURGERY     bilateral  . SHOULDER SURGERY     left  . TOE SURGERY     right foot  . TONSILLECTOMY    . TUBAL LIGATION    . UPPER GASTROINTESTINAL ENDOSCOPY  11-27-01, 08-18-08   Hiatal hernia, benign esophagus neoplasia, gastritis, tortuous esophagus 52 Maloney dilation performed  Social History   Socioeconomic History  . Marital status: Widowed    Spouse name: None  . Number of children: 6  . Years of education: None  . Highest education level: None  Social Needs  . Financial resource strain: None  . Food insecurity - worry: None  . Food insecurity - inability: None  . Transportation needs - medical: None  . Transportation needs - non-medical: None  Occupational History  . 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 Smoking status: Former Smoker    Packs/day: 0.50    Years: 60.00    Pack years: 30.00    Types: Cigarettes    Last attempt to quit: 02/26/2014    Years since quitting: 3.5  . Smokeless tobacco: Never Used  Substance and Sexual Activity  . Alcohol use: No  . Drug use: No  . Sexual activity: No  Other Topics Concern  . None  Social History Narrative   [employer], clinical   Goes to [place] twice a year to visit sister- 3-4 weeks travel there every year.   Pt does not get regular exercise    Family History  Problem Relation Age of Onset  . Heart attack Father        deceased at 32, MGF  . Colon cancer Father   . Prostate cancer Father   . Heart disease Father   . Hyperlipidemia Father   . Heart disease Mother        deaceased at age 44  . Colitis Mother   . Stroke Mother   . Hyperlipidemia Mother   . Colitis Sister   . Hyperlipidemia Sister   . Other Brother        deceased age 10; war  . Hyperlipidemia Brother   . Breast cancer Maternal Aunt         great   . Lung cancer Paternal Aunt        great  . Vasculitis Son   . Hyperlipidemia Son   . Hypertension Son   . Heart attack Son   . Cancer Sister        unknown  . Hyperlipidemia Sister   . Hyperlipidemia Daughter   . Hypertension Daughter   . COPD Neg Hx   . Asthma Neg Hx     Review of Systems  Constitutional: Negative for chills and fever.  HENT: Positive for ear pain (intermittent left ear), sneezing and sore throat (? sore throat). Negative for congestion and sinus pain.   Respiratory: Positive for cough (dry) and shortness of breath. Negative for wheezing.   Cardiovascular: Negative for chest pain.  Gastrointestinal: Positive for abdominal pain, diarrhea and nausea. Negative for constipation.  Genitourinary: Negative for dysuria and hematuria.  Musculoskeletal: Negative for back pain, neck pain and neck stiffness.  Neurological: Positive for dizziness, light-headedness and headaches. Negative for weakness and numbness.       Objective:   Vitals:   09/11/17 1446  BP: 130/82  Pulse: 82  Temp: 97.7 F (36.5 C)  SpO2: 99%   Filed Weights   09/11/17 1446  Weight: 143 lb (64.9 kg)   Body mass index is 29.89 kg/m.  Wt Readings from Last 3 Encounters:  09/11/17 143 lb (64.9 kg)  09/04/17 141 lb 12.8 oz (64.3 kg)  07/19/17 134 lb 8 oz (61 kg)     Physical Exam  Constitutional: She is oriented to person, place, and time. She  appears well-developed and well-nourished. No distress.  HENT:  Head: Normocephalic and atraumatic.  Right Ear: External ear normal.  Left Ear: External ear normal.  Mouth/Throat: Oropharynx is clear and moist.  Eyes: Conjunctivae are normal.  Neck: Neck supple. No tracheal deviation present. No thyromegaly present.  Cardiovascular: Normal rate and regular rhythm.  Pulmonary/Chest: She is in respiratory distress. She has no wheezes.  Diffuse dry crackles - unchanged  Musculoskeletal:  No neck or upper back pain.      Lymphadenopathy:    She has no cervical adenopathy.  Neurological: She is alert and oriented to person, place, and time. Coordination normal.  Normal sensation and strength  Skin: Skin is warm and dry. No rash noted. She is not diaphoretic. No erythema.  Scalp non tender to palpation  Psychiatric: She has a normal mood and affect. Her behavior is normal.          Assessment & Plan:    See Problem List for Assessment and Plan of chronic medical problems.

## 2017-09-11 NOTE — Assessment & Plan Note (Addendum)
Depo-medrol 80 mg Im x 1 today  New in last two weeks Likely neuralgia New for her - does not usually get headaches She deferred CT of the head today - but will consider it if headache persists Deferred gabapentin Stop advil, since she is taking plavix Take tylenol as needed  Call if no improvement

## 2017-09-20 ENCOUNTER — Other Ambulatory Visit: Payer: Self-pay | Admitting: Cardiology

## 2017-10-01 ENCOUNTER — Telehealth: Payer: Self-pay | Admitting: Internal Medicine

## 2017-10-01 NOTE — Telephone Encounter (Signed)
I called pt and advised her that CY was ok to write an order for POC. When I stated I would senf this to her DME company, she was confused and stated I had to send it to [place]. She states she will call me back after she gathers more information.

## 2017-10-01 NOTE — Telephone Encounter (Signed)
Spoke with pt, she states her insurance would pay for a POC  [employer]. Can we send in an order for one? She is on 3L continuous right now. CY please advise.

## 2017-10-01 NOTE — Telephone Encounter (Signed)
Ok to order POC   3L/min   Dx chronic hypoxic respiratory failure

## 2017-10-03 NOTE — Telephone Encounter (Signed)
Waiting for the pt to call back with information of where to send the order for the POC.

## 2017-10-04 NOTE — Telephone Encounter (Signed)
Spoke with pt, she wants me to call [employer][phone]   Spoke with [employer] and they stated it may take several weeks or even months to get a system through private insurance. Spoke with Ralph Dowdy at [place] [employer] [phone] and he states her insurance is not one they work with and he could only offer her cash pay or financing option to receive the [employer].   Spoke with pt, I convinced her to come in to see if she would qualify for a simply go mini here at our office. I explained it is similar to the [employer] but a different manufacturer. Pt agreed and I scheduled her for a qualifying walk on 10/15/2017 at 3:30 pm. Nothing further is needed.

## 2017-10-09 ENCOUNTER — Other Ambulatory Visit: Payer: Self-pay | Admitting: *Deleted

## 2017-10-11 ENCOUNTER — Other Ambulatory Visit: Payer: Self-pay | Admitting: *Deleted

## 2017-10-11 MED ORDER — ISOSORBIDE MONONITRATE ER 30 MG PO TB24: 30.0000 mg | ORAL_TABLET | Freq: Every day | ORAL | 3 refills | Status: DC

## 2017-10-14 ENCOUNTER — Other Ambulatory Visit: Payer: Self-pay | Admitting: Internal Medicine

## 2017-10-14 ENCOUNTER — Other Ambulatory Visit: Payer: Self-pay

## 2017-10-14 MED ORDER — ISOSORBIDE MONONITRATE ER 30 MG PO TB24: 30.0000 mg | ORAL_TABLET | Freq: Every day | ORAL | 6 refills | Status: DC

## 2017-10-15 ENCOUNTER — Other Ambulatory Visit: Payer: Self-pay | Admitting: Emergency Medicine

## 2017-10-15 ENCOUNTER — Ambulatory Visit (INDEPENDENT_AMBULATORY_CARE_PROVIDER_SITE_OTHER): Payer: Commercial Managed Care - PPO | Admitting: *Deleted

## 2017-10-15 DIAGNOSIS — J449 Chronic obstructive pulmonary disease, unspecified: Secondary | ICD-10-CM | POA: Diagnosis not present

## 2017-10-15 MED ORDER — RANITIDINE HCL 150 MG PO TABS: ORAL_TABLET | ORAL | 1 refills | Status: DC

## 2017-10-15 NOTE — Progress Notes (Signed)
Qualifying walk completed today on Simply Go Mini POC.

## 2017-10-30 ENCOUNTER — Telehealth: Payer: Self-pay | Admitting: Internal Medicine

## 2017-10-30 NOTE — Telephone Encounter (Signed)
Spoke with patient regarding simply go mini from [facility]. Pt is aware that a message will be sent to [facility] to follow up on Note   Sheran Lawless, Lillia Mountain, Isla Pence, [place]; [place], Zettie Pho, RN; Judeth Horn, Katrina A        Hey guys. This order is for a Simply Go Mini.   You all have already sent [place] five for the month of December.    I sent a message back asking them to put the patient on for January 2019     [facility] could you please follow up

## 2017-10-30 NOTE — Telephone Encounter (Signed)
Spoke with the pt and notified of recs per [facility] and she verbalized understanding  Nothing further needed

## 2017-10-30 NOTE — Telephone Encounter (Signed)
I called Linda Kelly at [facility].  He is pulling pt's info & said he would send to process.  Pt already has inhome system so he said they would contact her in 24-48 hours to set up time to pick up home system & bring poc.

## 2018-01-09 ENCOUNTER — Ambulatory Visit (INDEPENDENT_AMBULATORY_CARE_PROVIDER_SITE_OTHER)
Admission: RE | Admit: 2018-01-09 | Discharge: 2018-01-09 | Disposition: A | Discharge status: Home / Self Care | Payer: Commercial Managed Care - PPO | Source: Ambulatory Visit | Attending: Internal Medicine | Admitting: Internal Medicine

## 2018-01-09 ENCOUNTER — Encounter: Payer: Self-pay | Admitting: Internal Medicine

## 2018-01-09 ENCOUNTER — Ambulatory Visit (INDEPENDENT_AMBULATORY_CARE_PROVIDER_SITE_OTHER): Payer: Commercial Managed Care - PPO | Admitting: Internal Medicine

## 2018-01-09 VITALS — BP 114/66 | HR 85 | Ht <= 58 in | Wt 144.4 lb

## 2018-01-09 DIAGNOSIS — J449 Chronic obstructive pulmonary disease, unspecified: Secondary | ICD-10-CM | POA: Diagnosis not present

## 2018-01-09 DIAGNOSIS — J8410 Pulmonary fibrosis, unspecified: Secondary | ICD-10-CM | POA: Diagnosis not present

## 2018-01-09 DIAGNOSIS — J9611 Chronic respiratory failure with hypoxia: Secondary | ICD-10-CM

## 2018-01-09 MED ORDER — LEVALBUTEROL HCL 0.63 MG/3ML IN NEBU
0.6300 mg | INHALATION_SOLUTION | Freq: Once | RESPIRATORY_TRACT | Status: AC
  Administered 2018-01-09: 0.63 mg via RESPIRATORY_TRACT

## 2018-01-09 NOTE — Patient Instructions (Signed)
Order- neb xop 0.63   Dx CPD mixed type  It this treatment help your breathing, then you might try using your own nebulizer at home once or twice a day to see if it helps.  Order- CXR

## 2018-01-09 NOTE — Progress Notes (Signed)
Patient ID: Linda Kelly, female    DOB: 15-Jun-1928, 84 y.o.   MRN: [id number]  HPI 06/07/11- 8 yoF former smoker followed for pulmonary fibrosis/ UIP, COPD chronic hypoxic respiratory failure, complicated by CAD/ MI, PAD, hx lung nodule, [place] exposure with long term f/u here.  CT chest 01/04/15- the pattern is most compatible with usual  interstitial pneumonia (UIP) CT chest 11/28/2016- Continued progression of fibrotic interstitial lung disease, Aortic atherosclerosis. Stable ectasia of the ascending thoracicaorta, maximum diameter 4.2 cm ----------------------------------------------------------------------------- 09/04/17- 84 year old female former smoker followed for pulmonary fibrosis/ILD/UIP, COPD, chronic hypoxic respiratory failure, CAD/history MI, PAD, history lung nodule, dizziness, [place] dust exposure with long-term follow-up here [employer] because of side effects- doesn't want to try Ofev. O2 3 L /Advanced -COPD mixed type-pt continues to wear O2 but has recently noted right side of head hurting with pain that does not go away. Pain has been there for past several days. - Here with daughter.  Has had left temple pain times 3 days.  May have begun with some soreness behind the left eye which has improved.  No definite change in vision or hearing.  Malaise and some unsteadiness without vertigo.  No trauma.  Mild nasal stuffiness from oxygen unchanged.  No chest pain or abdominal upset. Has had flu vaccine.  01/09/18- 84 year old female former smoker followed for pulmonary fibrosis/ILD/UIP, COPD, chronic hypoxic respiratory failure, CAD/history MI, PAD, history lung nodule, dizziness, [place] dust exposure with long-term follow-up here O2 3-4L/m Advanced ----COPD mixed type-slight cough-dry. Sneezing again. Otherwise patient states she is doing well.  She reports a good winter with no significant exacerbations and  little cough now. Describes a "numb" feeling over her body with exertion/walking, relieved by rest and involving body below the neck bilaterally.  Does not feel it as a pain or palpitation, or near syncope. Continues nebulizer.  Review of Systems-see HPI + = positive Constitutional:   No-   weight loss, night sweats, fevers, chills,+ fatigue, lassitude. HEENT:   No-  headaches, difficulty swallowing, tooth/dental problems, sore throat,       No-  sneezing, itching, ear ache, nasal congestion, post nasal drip,  CV:  No-   chest pain, orthopnea, PND, swelling in lower extremities, anasarca, dizziness, palpitations Resp: +shortness of breath with exertion or at rest.              No-   productive cough,   + non-productive cough,  No-  coughing up of blood.              No-   change in color of mucus.  No- wheezing.   Skin: No-   rash or lesions. GI:   heartburn, indigestion, no-abdominal pain, nausea, vomiting,  GU: MS:  No-   joint pain or swelling.  . Neuro- + dizziness,  Psych:  No- change in mood or affect. No depression or anxiety.  No memory loss.  Objective:   Physical Exam General- Alert, Oriented, Affect-appropriate, Distress- none acute,.   O2 3.5L portable Skin- rash-none, lesions- none, excoriation- none Lymphadenopathy- none Head- atraumatic                         Eyes- Gross vision intact, PERRLA, conjunctivae clear secretions            Ears- Hearing, canals normal            Nose- +  mild stuffy, No-Septal dev, mucus, polyps, erosion, perforation             Throat- Mallampati II , mucosa clear , drainage- none, tonsils- atrophic;  +dentures Neck- flexible , trachea midline, no stridor , thyroid nl, carotid +R bruit Chest - symmetrical excursion , unlabored           Heart/CV- RRR , no murmur , no gallop  , no rub, nl s1 s2                           - JVD- none , edema- none, stasis changes- none, varices- none           Lung- +crackles to scapulae, unlabored at rest,   wheeze- none, cough- none , dullness-none, rub- none, 94% RA           Chest wall-  Abd-  Br/ Gen/ Rectal- Not done, not indicated Extrem- cyanosis- none, clubbing- none, atrophy- none, strength- weak Neuro- +seems weak but interactive.  Nonfocal.  Speech clear.

## 2018-01-14 NOTE — Assessment & Plan Note (Signed)
UIP pattern.  Unknown relationship to her [place] dust exposure in 2001 Plan-update CXR

## 2018-01-14 NOTE — Assessment & Plan Note (Signed)
She remains dependent on continuous oxygen.  We discussed saturation goals.

## 2018-01-14 NOTE — Assessment & Plan Note (Signed)
Discussed cough and the COPD component from her old smoking. Plan-nebulizer treatment Xopenex

## 2018-01-16 ENCOUNTER — Telehealth: Payer: Self-pay | Admitting: Internal Medicine

## 2018-01-16 MED ORDER — RANITIDINE HCL 150 MG PO TABS: ORAL_TABLET | ORAL | 1 refills | Status: DC

## 2018-01-16 NOTE — Telephone Encounter (Signed)
[facility] [address].

## 2018-01-18 ENCOUNTER — Other Ambulatory Visit: Payer: Self-pay | Admitting: Cardiology

## 2018-01-22 ENCOUNTER — Other Ambulatory Visit: Payer: Self-pay | Admitting: Cardiology

## 2018-01-25 NOTE — Progress Notes (Signed)
Subjective:    Patient ID: Linda Kelly, female    DOB: 26-Aug-1928, 84 y.o.   MRN: [id number]  HPI The patient is here for follow up of her blood work done last month.  She had blood work done in March in [place] a tthe world Scientist, research (physical sciences), 9/11 doctor.   Blood work was reviewed together.    COPD, pulmonary fibrosis:  She is following with pulmonary and on oxygen continuously.  She has chronic SOB and an occasional dry cough.  She denies wheeze, fever, chills.  She has chronic fatigue.    Dizziness:  She has dizziness when she moves and sometimes at rest.  She feels it now a little.  She sometimes feels better with 4 L of oxygen compared to 3 L.  She has not checked her oxygen level to see if her oxgyen is low when she feels dizzy.    Her feet to her ankles feel enclosed in heavy socks.  She is usually barefoot at home.  She denies any swelling or numbness/tingling.   It is fairly constant. They feels better when they are elevated.  Her feet are on the cold side all the time.    Anxiety, depression: She is taking her medication daily as prescribed. She denies any side effects from the medication. She feels her anxiety and depression are well controlled and she is happy with her current dose of medication.   CHF, CAD, Hypertension: She is taking her medication daily. She is compliant with a low sodium diet.  She has had occasional chest pain and has taken nitro.  She denies palpitations, edema, shortness of breath and regular headaches.       Medications and allergies reviewed with patient and updated if appropriate.  Patient Active Problem List   Diagnosis Date Noted  . Left-sided headache 09/11/2017  . Ischemic cardiomyopathy 03/14/2017  . Pain of left upper extremity 02/18/2017  . Steroid-induced hyperglycemia 01/08/2017  . Normocytic anemia 01/08/2017  . Leukocytosis 01/08/2017  . Anxiety state 01/08/2017  . HCAP (healthcare-associated pneumonia) 01/08/2017  . Increased  oxygen demand   . Palliative care by specialist   . Encounter for hospice care discussion   . Palliative care encounter   . Goals of care, counseling/discussion   . Demand ischemia ([place])   . Elevated troponin   . Chronic respiratory failure with hypoxia ([place]) 11/29/2016  . Episodic lightheadedness 08/28/2016  . Sleep difficulties 08/28/2016  . Chronic combined systolic (congestive) and diastolic (congestive) heart failure ([place])   . Pulmonary fibrosis ([place])   . Non-ST elevation (NSTEMI) myocardial infarction ([place]) 08/08/2016  . Lumbar radiculopathy 08/01/2016  . Left-sided low back pain without sciatica 03/28/2016  . Cephalalgia 03/10/2016  . Overactive bladder 02/21/2016  . Poor balance 02/21/2016  . Chronic lower back pain 02/21/2016  . Carotid stenosis-s/p CEA 2015 03/03/2014  . PVD (peripheral vascular disease) ([place]) 02/04/2014  . Change in bowel habits 05/22/2011  . URINARY INCONTINENCE 12/22/2010  . ABDOMINAL BRUIT 08/10/2010  . FASTING HYPERGLYCEMIA 11/15/2009  . Depression 08/01/2009  . GERD 08/01/2009  . IRRITABLE BOWEL SYNDROME 12/27/2008  . TINNITUS, CHRONIC 11/05/2008  . HEARING LOSS, HIGH FREQUENCY 11/05/2008  . HIATAL HERNIA 08/11/2008  . OTHER NONTHROMBOCYTOPENIC PURPURAS 06/08/2008  . ORTHOSTATIC HYPOTENSION 06/08/2008  . Lung nodule 11/20/2007  . OBESITY, MILD 11/19/2007  . Hyperlipidemia 10/09/2007  . Tobacco abuse, in remission 10/09/2007  . Essential hypertension 10/09/2007  . CAD S/P percutaneous coronary angioplasty 1985 10/09/2007  .  COPD mixed type ([place]) 10/09/2007  . VERTIGO 03/31/2007  . Headache 03/31/2007    Current Outpatient Medications on File Prior to Visit  Medication Sig Dispense Refill  . acetaminophen (TYLENOL) 650 MG CR tablet Take 650 mg by mouth daily.    Marland Kitchen ALPRAZolam (XANAX) 0.25 MG tablet Take 1 tablet (0.25 mg total) by mouth 3 (three) times daily as needed for anxiety. 30 tablet 2  . aspirin EC 81 MG tablet Take 81 mg by mouth  at bedtime.     . cholecalciferol (VITAMIN D) 1000 UNITS tablet Take 1,000 Units by mouth at bedtime.     . clopidogrel (PLAVIX) 75 MG tablet TAKE 1 TABLET BY MOUTH EVERY DAY 90 tablet 1  . furosemide (LASIX) 40 MG tablet TAKE 1 TABLET BY MOUTH TWICE A DAY 180 tablet 2  . ipratropium-albuterol (DUONEB) 0.5-2.5 (3) MG/3ML SOLN Take 3 mLs by nebulization every 4 (four) hours as needed. 360 mL 12  . KLOR-CON 10 10 MEQ tablet TAKE 1/2 TABLET ONCE DAILY 45 tablet 3  . metoprolol succinate (TOPROL-XL) 25 MG 24 hr tablet TAKE 1/2 TABLET EVERY DAY 15 tablet 4  . montelukast (SINGULAIR) 10 MG tablet Take 1 tablet (10 mg total) by mouth at bedtime. 90 tablet 3  . nitroGLYCERIN (NITROSTAT) 0.4 MG SL tablet Place 1 tablet (0.4 mg total) under the tongue every 5 (five) minutes x 3 doses as needed for chest pain. 25 tablet 3  . Omega-3 Fatty Acids (FISH OIL) 1000 MG CAPS Take 1,000 mg by mouth at bedtime.     . OXYGEN Inhale 3.5 L continuous into the lungs.    . ranitidine (ZANTAC) 150 MG tablet TAKE 1 TABLET BY MOUTH TWICE DAILY 180 tablet 1  . sertraline (ZOLOFT) 50 MG tablet TAKE 1 TABLET BY MOUTH EVERY DAY 90 tablet 1  . simvastatin (ZOCOR) 40 MG tablet Take 1 tablet (40 mg total) by mouth daily. 90 tablet 3  . vitamin E 400 UNIT capsule Take 400 Units by mouth at bedtime.     . isosorbide mononitrate (IMDUR) 30 MG 24 hr tablet Take 1 tablet (30 mg total) by mouth daily. 45 tablet 6   No current facility-administered medications on file prior to visit.     Past Medical History:  Diagnosis Date  . Anxiety   . Anxiety and depression   . Arthritis   . Benign neoplasm of esophagus   . CAD (coronary artery disease)   . Cancer [facility])    Skin cancer on back  . Carotid artery disease ([name])   . CHF (congestive heart failure) ([place])   . Chronic gastritis   . Colon, diverticulosis   . COPD (chronic obstructive pulmonary disease) ([place])   . Depression   . GERD (gastroesophageal reflux disease)   . Hiatal  hernia   . High frequency hearing loss   . Hyperlipidemia   . IBS (irritable bowel syndrome)   . Idiopathic pulmonary fibrosis   . Internal hemorrhoid   . MI (myocardial infarction) ([place]) 1985  . Other nonthrombocytopenic purpuras   . PONV (postoperative nausea and vomiting)   . PVD (peripheral vascular disease) ([place])   . Shortness of breath    with exertion  . Solitary pulmonary nodule   . Stroke [facility])    Hx: of several mini -strokes  . Tinnitus    chronic  . Vertigo     Past Surgical History:  Procedure Laterality Date  . ANGIOPLASTY  1997, 98, 99  .  BREAST SURGERY     Hx; of biopsy  . CARDIAC CATHETERIZATION N/A 08/10/2016   Procedure: Left Heart Cath and Coronary Angiography;  Surgeon: Belva Crome, MD;  Location: [facility] [facility];  Service: Cardiovascular;  Laterality: N/A;  . CAROTID ENDARTERECTOMY     left side x 3, saphenous vein graft from left leg  . CATARACT EXTRACTION W/ INTRAOCULAR LENS  IMPLANT, BILATERAL    . COLON SURGERY    . COLONOSCOPY  11-27-01, 10-30-05, 05-24-11   diverticulosis, hemorrhoids  . DENTAL SURGERY     2012   . ENDARTERECTOMY Right 03/03/2014   Procedure: RIGHT CAROTID ENDARTERECTOMY WITH PATCH ANGIOPLASTY;  Surgeon: Rosetta Posner, MD;  Location: [facility];  Service: Vascular;  Laterality: Right;  . FOOT SURGERY     bilateral  . SHOULDER SURGERY     left  . TOE SURGERY     right foot  . TONSILLECTOMY    . TUBAL LIGATION    . UPPER GASTROINTESTINAL ENDOSCOPY  11-27-01, 08-18-08   Hiatal hernia, benign esophagus neoplasia, gastritis, tortuous esophagus 30 Maloney dilation performed    Social History   Socioeconomic History  . Marital status: Widowed    Spouse name: Not on file  . Number of children: 6  . Years of education: Not on file  . Highest education level: Not on file  Occupational History  . Occupation: Armed forces operational officer at [employer]: [place]  . Financial resource strain: Not on file  . Food  insecurity:    Worry: Not on file    Inability: Not on file  . Transportation needs:    Medical: Not on file    Non-medical: Not on file  Tobacco Use  . Smoking status: Former Smoker    Packs/day: 0.50    Years: 60.00    Pack years: 30.00    Types: Cigarettes    Last attempt to quit: 02/26/2014    Years since quitting: 3.9  . Smokeless tobacco: Never Used  Substance and Sexual Activity  . Alcohol use: No  . Drug use: No  . Sexual activity: Never  Lifestyle  . Physical activity:    Days per week: Not on file    Minutes per session: Not on file  . Stress: Not on file  Relationships  . Social connections:    Talks on phone: Not on file    Gets together: Not on file    Attends religious service: Not on file    Active member of club or organization: Not on file    Attends meetings of clubs or organizations: Not on file    Relationship status: Not on file  Other Topics Concern  . Not on file  Social History Narrative   [place] to [place] twice a year to visit sister- 3-4 weeks travel there every year.   Pt does not get regular exercise    Family History  Problem Relation Age of Onset  . Heart attack Father        deceased at 68, MGF  . Colon cancer Father   . Prostate cancer Father   . Heart disease Father   . Hyperlipidemia Father   . Heart disease Mother        deaceased at age 28  . Colitis Mother   . Stroke Mother   . Hyperlipidemia Mother   . Colitis Sister   . Hyperlipidemia Sister   .  Other Brother        deceased age 11; war  . Hyperlipidemia Brother   . Breast cancer Maternal Aunt        great   . Lung cancer Paternal Aunt        great  . Vasculitis Son   . Hyperlipidemia Son   . Hypertension Son   . Heart attack Son   . Cancer Sister        unknown  . Hyperlipidemia Sister   . Hyperlipidemia Daughter   . Hypertension Daughter   . COPD Neg Hx   . Asthma Neg Hx     Review of Systems  Constitutional:  Positive for fatigue (energy level 1.5). Negative for appetite change (good ), chills and fever.  HENT: Positive for sneezing.   Respiratory: Positive for cough (occ, dry cough) and shortness of breath. Negative for wheezing.   Cardiovascular: Positive for chest pain (one episode - took ntg helped). Negative for palpitations and leg swelling.  Neurological: Positive for dizziness. Negative for numbness and headaches.       Objective:   Vitals:   01/27/18 1519  BP: 114/80  Pulse: 85  Resp: 18  Temp: 98 F (36.7 C)  SpO2: 93%   BP Readings from Last 3 Encounters:  01/27/18 114/80  01/09/18 114/66  09/11/17 130/82   Wt Readings from Last 3 Encounters:  01/27/18 144 lb (65.3 kg)  01/09/18 144 lb 6.4 oz (65.5 kg)  09/11/17 143 lb (64.9 kg)   Body mass index is 30.1 kg/m.   Physical Exam    Constitutional: Appears well-developed and well-nourished. No distress.  HENT:  Head: Normocephalic and atraumatic.  Neck: Neck supple. No tracheal deviation present. No thyromegaly present.  No cervical lymphadenopathy Cardiovascular: Normal rate, regular rhythm and normal heart sounds.   No murmur heard. No carotid bruit .  No edema Pulmonary/Chest: Effort normal and breath sounds normal. No respiratory distress. No has no wheezes. No rales.  Skin: Skin is warm and dry. Not diaphoretic.  Psychiatric: Normal mood and affect. Behavior is normal.      Assessment & Plan:    See Problem List for Assessment and Plan of chronic medical problems.

## 2018-01-27 ENCOUNTER — Ambulatory Visit (INDEPENDENT_AMBULATORY_CARE_PROVIDER_SITE_OTHER): Payer: PPO | Admitting: Internal Medicine

## 2018-01-27 ENCOUNTER — Encounter: Payer: Self-pay | Admitting: Internal Medicine

## 2018-01-27 VITALS — BP 114/80 | HR 85 | Temp 98.0°F | Resp 18 | Wt 144.0 lb

## 2018-01-27 DIAGNOSIS — I10 Essential (primary) hypertension: Secondary | ICD-10-CM | POA: Diagnosis not present

## 2018-01-27 DIAGNOSIS — F329 Major depressive disorder, single episode, unspecified: Secondary | ICD-10-CM | POA: Diagnosis not present

## 2018-01-27 DIAGNOSIS — F32A Depression, unspecified: Secondary | ICD-10-CM

## 2018-01-27 DIAGNOSIS — F411 Generalized anxiety disorder: Secondary | ICD-10-CM

## 2018-01-27 DIAGNOSIS — I5042 Chronic combined systolic (congestive) and diastolic (congestive) heart failure: Secondary | ICD-10-CM | POA: Diagnosis not present

## 2018-01-27 NOTE — Assessment & Plan Note (Signed)
Controlled, stable Continue current dose of medication  

## 2018-01-27 NOTE — Assessment & Plan Note (Signed)
Chronic SOB -no change - from lungs No leg edema Appears euvolemic Continue current medications

## 2018-01-27 NOTE — Assessment & Plan Note (Signed)
BP well controlled Current regimen effective and well tolerated Continue current medications at current doses  

## 2018-01-27 NOTE — Patient Instructions (Signed)
  Medications reviewed and updated.  No changes recommended at this time.     Please followup in 6 months   

## 2018-02-23 ENCOUNTER — Other Ambulatory Visit: Payer: Self-pay | Admitting: Internal Medicine

## 2018-03-18 ENCOUNTER — Ambulatory Visit: Payer: Self-pay | Admitting: Internal Medicine

## 2018-04-21 ENCOUNTER — Other Ambulatory Visit: Payer: Self-pay | Admitting: Internal Medicine

## 2018-04-23 ENCOUNTER — Telehealth: Payer: Self-pay | Admitting: Cardiology

## 2018-04-23 NOTE — Telephone Encounter (Signed)
New message:        *STAT* If patient is at the pharmacy, call can be transferred to refill team.   1. Which medications need to be refilled? (please list name of each medication and dose if known) isosorbide mononitrate (IMDUR) 30 MG 24 hr tablet(Expired)  2. Which pharmacy/location (including street and city if local pharmacy) is medication to be sent to?[id number] [place] [address]. AT [address]  3. Do they need a 30 day or 90 day supply? [place]

## 2018-04-24 ENCOUNTER — Other Ambulatory Visit: Payer: Self-pay | Admitting: Cardiology

## 2018-04-25 MED ORDER — ISOSORBIDE MONONITRATE ER 30 MG PO TB24: 30.0000 mg | ORAL_TABLET | Freq: Every day | ORAL | 2 refills | Status: DC

## 2018-05-15 ENCOUNTER — Ambulatory Visit (INDEPENDENT_AMBULATORY_CARE_PROVIDER_SITE_OTHER)
Admission: RE | Admit: 2018-05-15 | Discharge: 2018-05-15 | Disposition: A | Discharge status: Home / Self Care | Payer: PPO | Source: Ambulatory Visit | Attending: Internal Medicine | Admitting: Internal Medicine

## 2018-05-15 ENCOUNTER — Encounter: Payer: Self-pay | Admitting: Internal Medicine

## 2018-05-15 ENCOUNTER — Ambulatory Visit (INDEPENDENT_AMBULATORY_CARE_PROVIDER_SITE_OTHER): Payer: PPO | Admitting: Internal Medicine

## 2018-05-15 VITALS — BP 100/60 | HR 92 | Temp 97.5°F | Ht <= 58 in | Wt 143.0 lb

## 2018-05-15 DIAGNOSIS — J8410 Pulmonary fibrosis, unspecified: Secondary | ICD-10-CM

## 2018-05-15 DIAGNOSIS — F411 Generalized anxiety disorder: Secondary | ICD-10-CM | POA: Diagnosis not present

## 2018-05-15 DIAGNOSIS — K219 Gastro-esophageal reflux disease without esophagitis: Secondary | ICD-10-CM

## 2018-05-15 DIAGNOSIS — M545 Low back pain, unspecified: Secondary | ICD-10-CM

## 2018-05-15 DIAGNOSIS — R0602 Shortness of breath: Secondary | ICD-10-CM | POA: Diagnosis not present

## 2018-05-15 DIAGNOSIS — G8929 Other chronic pain: Secondary | ICD-10-CM | POA: Diagnosis not present

## 2018-05-15 MED ORDER — NITROGLYCERIN 0.4 MG SL SUBL: 0.4000 mg | SUBLINGUAL_TABLET | SUBLINGUAL | 0 refills | Status: DC | PRN

## 2018-05-15 MED ORDER — LIDOCAINE 5 % EX PTCH: 1.0000 | MEDICATED_PATCH | CUTANEOUS | 0 refills | Status: DC

## 2018-05-15 NOTE — Assessment & Plan Note (Signed)
She is having SOB although it is unclear to me if this is a flare or some deconditioning as well due to lack of activity. Checking CXR today and adjust if needed. We have asked her to follow up with her pulmonary specialist and consider pulmonary rehab if appropriate.

## 2018-05-15 NOTE — Progress Notes (Signed)
   Subjective:    Patient ID: Linda Kelly, female    DOB: July 17, 1928, 84 y.o.   MRN: [id number]  HPI The patient is an 84 YO female coming in for several concerns including GERD (taking nexium otc 1 pill daily, taking zantac in the morning, has gerd symptoms in the evening once she goes to bed, denies blood in stool, denies abdominal pain, mostly reflux symptoms), and low back pain (has been present for a long time, lasts for several minutes with standing and then decreases with movement until gone, taking tylenol for the pain but this does not help, is also having pain at night time in bed and cannot get comfortable, denies recent falls) and SOB (she does have chronic lung disease, not using her nebulizers lately, does not use any inhalers daily, some lack of energy and SOB more with doing any activity, she moves from bed to chair and then stays there most of the day, denies much activity, she does have cough, denies change in sputum, using 4.5 L oxygen currently, denies chest pains, is able to lie flat and breathe normally at night time, denies swelling or weight change).   Review of Systems  Constitutional: Positive for activity change, appetite change and fatigue. Negative for chills, fever and unexpected weight change.  Respiratory: Positive for cough and shortness of breath. Negative for chest tightness.   Cardiovascular: Negative.   Gastrointestinal: Positive for abdominal pain. Negative for abdominal distention, constipation, diarrhea, rectal pain and vomiting.       GERD  Musculoskeletal: Positive for arthralgias and back pain. Negative for gait problem, joint swelling, myalgias, neck pain and neck stiffness.  Skin: Negative.   Neurological: Negative.   Psychiatric/Behavioral: Negative.       Objective:   Physical Exam  Constitutional: She is oriented to person, place, and time. She appears well-developed and well-nourished.  HENT:  Head: Normocephalic and atraumatic.  Eyes: EOM are  normal.  Neck: Normal range of motion.  Cardiovascular: Normal rate and regular rhythm.  Pulmonary/Chest: Effort normal and breath sounds normal. No respiratory distress. She has no wheezes. She has no rales.  Some crackles in the bases, normal airflow and no wheezing or focal effusion or rhonchi.  Abdominal: Soft. Bowel sounds are normal. She exhibits no distension. There is tenderness. There is no rebound.  Some tenderness epigastric region  Musculoskeletal: She exhibits no edema.       Right lower leg: She exhibits no edema.       Left lower leg: She exhibits no edema.  Tenderness in the low back in a band, no radiation.  Neurological: She is alert and oriented to person, place, and time. Coordination normal.  Skin: Skin is warm and dry.  Psychiatric: She has a normal mood and affect.   Vitals:   05/15/18 1035  BP: 100/60  Pulse: 92  Temp: (!) 97.5 F (36.4 C)  TempSrc: Oral  SpO2: (!) 88%  Weight: 143 lb (64.9 kg)  Height: 4\' 10"  (1.473 m)      Assessment & Plan:

## 2018-05-15 NOTE — Assessment & Plan Note (Signed)
She asks for refill on xanax today but this in not appropriate during acute visit. Will forward request to PCP.

## 2018-05-15 NOTE — Assessment & Plan Note (Signed)
It does not sound like this is new (family member states more months than weeks). She has past x-ray with significant arthritis. Rx for lidoderm patches today to help with pain. It is unclear if the pain is limiting activity or if her limited activity is causing more pain from arthritis.

## 2018-05-15 NOTE — Assessment & Plan Note (Signed)
Advised to take zantac 1 pill twice a day instead of daily to help with night time symptoms.

## 2018-05-15 NOTE — Patient Instructions (Addendum)
We will check a chest x-ray today and call you back about the results.   We have sent in a lidoderm patch to use on the low back for pain.  We would like you to use the zantac (ranitidine) twice a day instead of once a day.   I will pass along the message about the prescription for care and other medicine to Dr. Quay Burow.

## 2018-05-16 ENCOUNTER — Other Ambulatory Visit: Payer: Self-pay | Admitting: Internal Medicine

## 2018-05-16 MED ORDER — PREDNISONE 20 MG PO TABS: 40.0000 mg | ORAL_TABLET | Freq: Every day | ORAL | 0 refills | Status: DC

## 2018-06-12 ENCOUNTER — Telehealth: Payer: Self-pay | Admitting: Emergency Medicine

## 2018-06-12 NOTE — Telephone Encounter (Signed)
LVM for [facility] to call back to start PA, unable to complete online.

## 2018-06-12 NOTE — Telephone Encounter (Signed)
Spoke with [facility] Rep. Pt's insurance does not cover patches. Pt states daughter went to pharmacy and found 4% lidocaine patches OTC and states that is working fine.

## 2018-06-12 NOTE — Telephone Encounter (Signed)
Copied from [place] [phone]. Topic: General - Other >> Jun 03, 2018  3:59 PM Gardiner Ramus wrote: Reason for CRM: pt needing PA for lidocaine (LIDODERM) 5 % [[id number]]. Please advise. Pt has been waiting since 05/15/18. [phone] pt states that the insurance that needs to be PA for is the [employer] insurance. [phone] or [phone].

## 2018-06-19 ENCOUNTER — Telehealth: Payer: Self-pay | Admitting: *Deleted

## 2018-06-19 NOTE — Telephone Encounter (Signed)
Called and changed 07/29/18 visit to 07/30/18, a day where patient will see PCP and then have AWV per Dr. Quay Burow' request.

## 2018-07-03 ENCOUNTER — Telehealth: Payer: Self-pay | Admitting: Internal Medicine

## 2018-07-03 NOTE — Telephone Encounter (Signed)
I don't recall talking with her about a sleep study, at least not lately. We would need to document reason for suspecting OSA- snoring, daytime sleepiness, witnessed apnea, etc.  Also, we can't do a home sleep test while she is wearing oxygen, because the oxygen flow would interfere with the monitoring device. I don't think it would be safe for her to stay off oxygen for a night. We could do a standard NPSG at sleep center, but the approval she got from her [facility] program would need to be revised.

## 2018-07-03 NOTE — Telephone Encounter (Signed)
LVM for Marlowe Kays at [employer] for pt regarding CY recommendations. X1

## 2018-07-03 NOTE — Telephone Encounter (Signed)
Spoke with Cordella Register at [facility], states that pt's request for an in-home sleep study has been approved d/t age, difficulty.  This is approved X60 days with no copay, no deductible.   Order #: [id number]. Per Marlowe Kays we are cleared to schedule testing.  Will forward to PCCs to follow up on.

## 2018-07-03 NOTE — Telephone Encounter (Signed)
I do not have an order for a home sleep study [place] do you know what this is about Linda Kelly

## 2018-07-03 NOTE — Telephone Encounter (Signed)
I am not aware of any order(s) for HST-nor do I see anything mentioned about HST in her most recent OV with CY. Will forward message to CY to advise. Thanks.

## 2018-07-04 NOTE — Telephone Encounter (Signed)
Attempted to call Marlowe Kays with the [employer]. I did not receive an answer. I have left a message for Marlowe Kays to return our call.

## 2018-07-07 ENCOUNTER — Other Ambulatory Visit: Payer: Self-pay | Admitting: Internal Medicine

## 2018-07-07 NOTE — Telephone Encounter (Signed)
[facility] at [phone]. On hold for more than 15 min. Will need to call back.

## 2018-07-08 ENCOUNTER — Other Ambulatory Visit: Payer: Self-pay | Admitting: Internal Medicine

## 2018-07-08 DIAGNOSIS — J309 Allergic rhinitis, unspecified: Secondary | ICD-10-CM

## 2018-07-08 NOTE — Telephone Encounter (Signed)
Last lipid was 08/2016. Would you like pt to come in for labs for refill?

## 2018-07-17 ENCOUNTER — Ambulatory Visit: Payer: Self-pay | Admitting: Internal Medicine

## 2018-07-23 ENCOUNTER — Ambulatory Visit (INDEPENDENT_AMBULATORY_CARE_PROVIDER_SITE_OTHER): Payer: PPO | Admitting: Internal Medicine

## 2018-07-23 ENCOUNTER — Ambulatory Visit (INDEPENDENT_AMBULATORY_CARE_PROVIDER_SITE_OTHER)
Admission: RE | Admit: 2018-07-23 | Discharge: 2018-07-23 | Disposition: A | Discharge status: Home / Self Care | Payer: PPO | Source: Ambulatory Visit | Attending: Internal Medicine | Admitting: Internal Medicine

## 2018-07-23 ENCOUNTER — Encounter: Payer: Self-pay | Admitting: Internal Medicine

## 2018-07-23 ENCOUNTER — Other Ambulatory Visit (INDEPENDENT_AMBULATORY_CARE_PROVIDER_SITE_OTHER): Payer: PPO

## 2018-07-23 VITALS — BP 112/68 | HR 88 | Ht <= 58 in | Wt 141.0 lb

## 2018-07-23 DIAGNOSIS — J8410 Pulmonary fibrosis, unspecified: Secondary | ICD-10-CM

## 2018-07-23 DIAGNOSIS — I509 Heart failure, unspecified: Secondary | ICD-10-CM | POA: Diagnosis not present

## 2018-07-23 DIAGNOSIS — J9611 Chronic respiratory failure with hypoxia: Secondary | ICD-10-CM

## 2018-07-23 DIAGNOSIS — I5042 Chronic combined systolic (congestive) and diastolic (congestive) heart failure: Secondary | ICD-10-CM

## 2018-07-23 DIAGNOSIS — J84112 Idiopathic pulmonary fibrosis: Secondary | ICD-10-CM | POA: Diagnosis not present

## 2018-07-23 LAB — BRAIN NATRIURETIC PEPTIDE: PRO B NATRI PEPTIDE: 162 pg/mL — AB (ref 0.0–100.0)

## 2018-07-23 NOTE — Patient Instructions (Signed)
Order- lab BNP    Dx Chronic respiratory failure with hypoxia              CXR     UIP/ pulmonary fibrosis, CHF   Ok to use your nebulizer machine twice daily if it helps you

## 2018-07-23 NOTE — Progress Notes (Signed)
Patient ID: Linda Kelly, female    DOB: 16-Mar-1928, 84 y.o.   MRN: [id number]  HPI 06/07/11- 45 yoF former smoker followed for pulmonary fibrosis/ UIP, COPD chronic hypoxic respiratory failure, complicated by CAD/ MI, PAD, hx lung nodule, h[place] exposure with long term f/u here.  GERD CT chest 01/04/15- the pattern is most compatible with usual  interstitial pneumonia (UIP) CT chest 11/28/2016- Continued progression of fibrotic interstitial lung disease, Aortic atherosclerosis. Stable ectasia of the ascending thoracicaorta, maximum diameter 4.2 cm -----------------------------------------------------------------------------  01/09/18- 84 year old female former smoker followed for pulmonary fibrosis/ILD/UIP, COPD, chronic hypoxic respiratory failure, CAD/history MI, PAD, history lung nodule, dizziness, [place] dust exposure with long-term follow-up here O2 3-4L/m Advanced ----COPD mixed type-slight cough-dry. Sneezing again. Otherwise patient states she is doing well.  She reports a good winter with no significant exacerbations and little cough now. Describes a "numb" feeling over her body with exertion/walking, relieved by rest and involving body below the neck bilaterally.  Does not feel it as a pain or palpitation, or near syncope. Continues nebulizer.  07/23/2018- 84 year old female former smoker followed for Pulmonary Fbrosis/ILD/UIP, COPD, chronic hypoxic respiratory failure, CAD/history MI, PAD, history lung nodule, GERD, dizziness, [place] dust exposure with long-term follow-up here O2 4-5L/m Advanced ----COPD mixed type: Increased SOB. Pt notes burning from stomach down to feet alot lately when she gets too Short winded-usually lasts 30-45 seconds.  Arrival saturation today 91% on 5 L pulse POC. Prednisone 40 mg daily, nebulizer DuoNeb, Singulair Times burning from her waist down to her feet on standing, does not have to be  walking. Think she is gradually becoming more short of breath with exertion but no acute events.  Uses nebulizer 2 or 3 times a week.  Frequent sneezing, may be drying from oxygen, but not much cough and no sputum or chest pain. We discussed imaging CXR 05/15/2018 IMPRESSION: 1.  Cardiomegaly. 2. Chronic interstitial lung disease. Slight increase in interstitial prominence suggesting the possibility of the presence of a mild component of CHF or pneumonitis  Review of Systems-see HPI + = positive Constitutional:   No-   weight loss, night sweats, fevers, chills,+ fatigue, lassitude. HEENT:   No-  headaches, difficulty swallowing, tooth/dental problems, sore throat,       No-  sneezing, itching, ear ache, nasal congestion, post nasal drip,  CV:  No-   chest pain, orthopnea, PND, swelling in lower extremities, anasarca, dizziness, palpitations Resp: +shortness of breath with exertion or at rest.              No-   productive cough,   + non-productive cough,  No-  coughing up of blood.              No-   change in color of mucus.  No- wheezing.   Skin: No-   rash or lesions. GI:   heartburn, indigestion, no-abdominal pain, nausea, vomiting,  GU: MS:  No-   joint pain or swelling.  . Neuro- + dizziness,  Psych:  No- change in mood or affect. No depression or anxiety.  No memory loss.  Objective:   Physical Exam General- Alert, Oriented, Affect-appropriate, Distress- none acute,.   O2 3.5L portable Skin- + bruise left upper arm-she is not sure which she did.  Pain. Lymphadenopathy- none Head- atraumatic  Eyes- Gross vision intact, PERRLA, conjunctivae clear secretions            Ears- Hearing, canals normal            Nose- + mild stuffy, No-Septal dev, mucus, polyps, erosion, perforation             Throat- Mallampati II , mucosa clear , drainage- none, tonsils- atrophic;  +dentures Neck- flexible , trachea midline, no stridor , thyroid nl, carotid +R bruit Chest  - symmetrical excursion , unlabored           Heart/CV- RRR , no murmur , no gallop  , no rub, nl s1 s2                           - JVD- none , edema- none, stasis changes- none, varices- none           Lung- +crackles to scapulae, unlabored at rest,  wheeze- none, cough- none , dullness-none, rub- none,            Chest wall-  Abd-  Br/ Gen/ Rectal- Not done, not indicated Extrem- cyanosis- none, clubbing- none, atrophy- none, strength- weak Neuro- +seems weak and a little vague.  Nonfocal.  Speech clear.

## 2018-07-27 NOTE — Progress Notes (Signed)
HPI: FU carotid artery disease, CAD, HLD, pulmonary fibrosis. Her cardiac issues date back to 29 when she had an MI. She was treated with a balloon angioplasty at that time. Echocardiogram October 2017 showed ejection fraction 61-95%, grade 2 diastolic dysfunction and mild to moderate mitral regurgitation. Cardiac catheterization October 2017 showed high-grade obstruction in the right iliac artery. There was severe multivessel coronary artery disease with occlusion of the right coronary artery, occlusion of the LAD, 99% distal circumflex and a very tortuous segment. Ejection fraction 35-45%. Medical therapy recommended. Surgery was not felt to be possible given her age and comorbidities.Chest CT January 2018showed interstitial lung disease. Atherosclerosis noted and thoracic aortic aneurysm measuring 4.2 cm. she has been found to have diffuse disease in her arch vessels on prior CTA including 80% right subclavian and 80% right vertebral. There was a 90% left subclavian and occluded left vertebral. Seen by vascular surgery and medical therapy recommended. Since last seen,  she has dyspnea on exertion unchanged.  No orthopnea, PND, pedal edema, chest pain or syncope.  Current Outpatient Medications  Medication Sig Dispense Refill  . acetaminophen (TYLENOL) 650 MG CR tablet Take 650 mg by mouth daily.    Marland Kitchen ALPRAZolam (XANAX) 0.25 MG tablet Take 1 tablet (0.25 mg total) by mouth 3 (three) times daily as needed for anxiety. 30 tablet 2  . aspirin EC 81 MG tablet Take 81 mg by mouth at bedtime.     . cholecalciferol (VITAMIN D) 1000 UNITS tablet Take 1,000 Units by mouth at bedtime.     . clopidogrel (PLAVIX) 75 MG tablet TAKE 1 TABLET BY MOUTH EVERY DAY 90 tablet 1  . furosemide (LASIX) 40 MG tablet TAKE 1 TABLET BY MOUTH TWICE A DAY 180 tablet 2  . ipratropium-albuterol (DUONEB) 0.5-2.5 (3) MG/3ML SOLN Take 3 mLs by nebulization every 4 (four) hours as needed. 360 mL 12  . KLOR-CON 10 10 MEQ  tablet TAKE 1/2 TABLET ONCE DAILY 45 tablet 3  . lidocaine (LIDODERM) 5 % Place 1 patch onto the skin daily. Remove & Discard patch within 12 hours or as directed by MD 30 patch 0  . metoprolol succinate (TOPROL-XL) 25 MG 24 hr tablet TAKE 1/2 TABLET EVERY DAY 45 tablet 1  . montelukast (SINGULAIR) 10 MG tablet TAKE 1 TABLET BY MOUTH EVERYDAY AT BEDTIME 90 tablet 0  . nitroGLYCERIN (NITROSTAT) 0.4 MG SL tablet Place 1 tablet (0.4 mg total) under the tongue every 5 (five) minutes x 3 doses as needed for chest pain. 3 tablet 0  . Omega-3 Fatty Acids (FISH OIL) 1000 MG CAPS Take 1,000 mg by mouth at bedtime.     . OXYGEN Inhale 3.5 L continuous into the lungs.    . predniSONE (DELTASONE) 20 MG tablet Take 2 tablets (40 mg total) by mouth daily with breakfast. 14 tablet 0  . ranitidine (ZANTAC) 150 MG tablet TAKE 1 TABLET BY MOUTH TWICE DAILY 180 tablet 1  . sertraline (ZOLOFT) 50 MG tablet TAKE 1 TABLET BY MOUTH EVERY DAY 30 tablet 5  . simvastatin (ZOCOR) 40 MG tablet TAKE 1 TABLET BY MOUTH EVERY DAY 90 tablet 3  . vitamin E 400 UNIT capsule Take 400 Units by mouth at bedtime.     . isosorbide mononitrate (IMDUR) 30 MG 24 hr tablet Take 1 tablet (30 mg total) by mouth daily. 30 tablet 2   No current facility-administered medications for this visit.      Past Medical History:  Diagnosis Date  . Anxiety   . Anxiety and depression   . Arthritis   . Benign neoplasm of esophagus   . CAD (coronary artery disease)   . Cancer [facility])    Skin cancer on back  . Carotid artery disease ([place])   . CHF (congestive heart failure) ([place])   . Chronic gastritis   . Colon, diverticulosis   . COPD (chronic obstructive pulmonary disease) ([place])   . Depression   . GERD (gastroesophageal reflux disease)   . Hiatal hernia   . High frequency hearing loss   . Hyperlipidemia   . IBS (irritable bowel syndrome)   . Idiopathic pulmonary fibrosis   . Internal hemorrhoid   . MI (myocardial infarction) ([place]) 1985    . Other nonthrombocytopenic purpuras   . PONV (postoperative nausea and vomiting)   . PVD (peripheral vascular disease) ([place])   . Shortness of breath    with exertion  . Solitary pulmonary nodule   . Stroke [facility])    Hx: of several mini -strokes  . Tinnitus    chronic  . Vertigo     Past Surgical History:  Procedure Laterality Date  . ANGIOPLASTY  1997, 98, 99  . BREAST SURGERY     Hx; of biopsy  . CARDIAC CATHETERIZATION N/A 08/10/2016   Procedure: Left Heart Cath and Coronary Angiography;  Surgeon: Belva Crome, MD;  Location: [facility];  Service: Cardiovascular;  Laterality: N/A;  . CAROTID ENDARTERECTOMY     left side x 3, saphenous vein graft from left leg  . CATARACT EXTRACTION W/ INTRAOCULAR LENS  IMPLANT, BILATERAL    . COLON SURGERY    . COLONOSCOPY  11-27-01, 10-30-05, 05-24-11   diverticulosis, hemorrhoids  . DENTAL SURGERY     2012   . ENDARTERECTOMY Right 03/03/2014   Procedure: RIGHT CAROTID ENDARTERECTOMY WITH PATCH ANGIOPLASTY;  Surgeon: Rosetta Posner, MD;  Location: [facility];  Service: Vascular;  Laterality: Right;  . FOOT SURGERY     bilateral  . SHOULDER SURGERY     left  . TOE SURGERY     right foot  . TONSILLECTOMY    . TUBAL LIGATION    . UPPER GASTROINTESTINAL ENDOSCOPY  11-27-01, 08-18-08   Hiatal hernia, benign esophagus neoplasia, gastritis, tortuous esophagus 42 Maloney dilation performed    Social History   Socioeconomic History  . Marital status: Widowed    Spouse name: Not on file  . Number of children: 6  . Years of education: Not on file  . Highest education level: Not on file  Occupational History  . Occupation: Armed forces operational officer at [employer]: [place]  . Financial resource strain: Not hard at all  . Food insecurity:    Worry: Never true    Inability: Never true  . Transportation needs:    Medical: No    Non-medical: No  Tobacco Use  . Smoking status: Former Smoker    Packs/day: 0.50    Years:  60.00    Pack years: 30.00    Types: Cigarettes    Last attempt to quit: 02/26/2014    Years since quitting: 4.4  . Smokeless tobacco: Never Used  Substance and Sexual Activity  . Alcohol use: No  . Drug use: No  . Sexual activity: Never  Lifestyle  . Physical activity:    Days per week: 0 days    Minutes per session: 0 min  . Stress: Only  a little  Relationships  . Social connections:    Talks on phone: More than three times a week    Gets together: More than three times a week    Attends religious service: 1 to 4 times per year    Active member of club or organization: Not on file    Attends meetings of clubs or organizations: 1 to 4 times per year    Relationship status: Widowed  . Intimate partner violence:    Fear of current or ex partner: Not on file    Emotionally abused: Not on file    Physically abused: Not on file    Forced sexual activity: Not on file  Other Topics Concern  . Not on file  Social History Narrative   [place] to [place] twice a year to visit sister- 3-4 weeks travel there every year.   Pt does not get regular exercise    Family History  Problem Relation Age of Onset  . Heart attack Father        deceased at 10, MGF  . Colon cancer Father   . Prostate cancer Father   . Heart disease Father   . Hyperlipidemia Father   . Heart disease Mother        deaceased at age 29  . Colitis Mother   . Stroke Mother   . Hyperlipidemia Mother   . Colitis Sister   . Hyperlipidemia Sister   . Other Brother        deceased age 54; war  . Hyperlipidemia Brother   . Breast cancer Maternal Aunt        great   . Lung cancer Paternal Aunt        great  . Vasculitis Son   . Hyperlipidemia Son   . Hypertension Son   . Heart attack Son   . Cancer Sister        unknown  . Hyperlipidemia Sister   . Hyperlipidemia Daughter   . Hypertension Daughter   . COPD Neg Hx   . Asthma Neg Hx     ROS: no fevers or chills,  productive cough, hemoptysis, dysphasia, odynophagia, melena, hematochezia, dysuria, hematuria, rash, seizure activity, orthopnea, PND, pedal edema, claudication. Remaining systems are negative.  Physical Exam: Well-developed well-nourished in no acute distress.  Skin is warm and dry.  HEENT is normal.  Neck is supple.  Chest is clear to auscultation with normal expansion.  Cardiovascular exam is regular rate and rhythm.  Abdominal exam nontender or distended. No masses palpated. Extremities show no edema. neuro grossly intact  A/P  1 coronary artery disease-patient doing well from a symptomatic standpoint.  Continue medical therapy with aspirin and statin.  Discontinue Plavix.  2 thoracic aortic aneurysm-noted on previous CT scan and only mildly dilated.  Patient would not be a surgical candidate and we have elected not to pursue follow-up imaging.  3 hypertension-patient's blood pressure is controlled.  Continue present medications.  Check potassium and renal function.  Check hemoglobin.  4 hyperlipidemia-continue statin.  Check lipids and liver.  5 chronic combined systolic/diastolic congestive heart failure-patient is euvolemic today.  Continue present dose of diuretics.  Continue fluid restriction and low-sodium diet.  Check potassium and renal function.  6 ischemic cardiomyopathy-continue beta-blocker.  We have not added an ACE inhibitor or ARB previously due to borderline blood pressure and history of orthostasis.  7 carotid artery disease-followed by vascular surgery.  8 pulmonary fibrosis  Kirk Ruths, MD

## 2018-07-29 ENCOUNTER — Ambulatory Visit: Payer: Self-pay | Admitting: Internal Medicine

## 2018-07-29 NOTE — Progress Notes (Addendum)
Subjective:   Linda Kelly is a 84 y.o. female who presents for an Initial Medicare Annual Wellness Visit.  Review of Systems    No ROS.  Medicare Wellness Visit. Additional risk factors are reflected in the social history.   Cardiac Risk Factors include: advanced age (>48men, >37 women);hypertension;dyslipidemia Sleep patterns: has interrupted sleep, gets up 1-2 times nightly to void and sleeps 6-7 hours nightly.    Home Safety/Smoke Alarms: Feels safe in home. Smoke alarms in place.  Living environment; residence and Firearm Safety: 1-story house/ trailer, equipment: Walkers, Type: Wellsite geologist, Type: Commode, no firearms. Lives with daughter, no needs for DME, good support system Seat Belt Safety/Bike Helmet: Wears seat belt.      Objective:    Today's Vitals   07/30/18 1326 07/30/18 1503  BP: 98/60   Pulse: 93   SpO2: 97%   Weight: 140 lb (63.5 kg)   Height: 4\' 10"  (1.473 m)   PainSc:  5    Body mass index is 29.26 kg/m.  Advanced Directives 07/30/2018 02/21/2017 01/15/2017 01/03/2017 08/08/2016 08/08/2016 06/05/2016  Does Patient Have a Medical Advance Directive? Yes Yes Yes Yes Yes No Yes  Type of Paramedic of [place];Living will [place];Living will Healthcare Power of [place];Living will Living will;Healthcare Power of Attorney - Living will;Healthcare Power of Attorney  Does patient want to make changes to medical advance directive? - - No - Patient declined No - Patient declined No - Patient declined - -  Copy of [place] in Chart? No - copy requested - - No - copy requested No - copy requested - -  Would patient like information on creating a medical advance directive? - - - - No - patient declined information No - patient declined information -    Current Medications (verified) Outpatient Encounter Medications as of 07/30/2018  Medication Sig  . acetaminophen  (TYLENOL) 650 MG CR tablet Take 650 mg by mouth daily.  Marland Kitchen ALPRAZolam (XANAX) 0.25 MG tablet Take 1 tablet (0.25 mg total) by mouth 3 (three) times daily as needed for anxiety.  Marland Kitchen aspirin EC 81 MG tablet Take 81 mg by mouth at bedtime.   . cholecalciferol (VITAMIN D) 1000 UNITS tablet Take 1,000 Units by mouth at bedtime.   . clopidogrel (PLAVIX) 75 MG tablet TAKE 1 TABLET BY MOUTH EVERY DAY  . furosemide (LASIX) 40 MG tablet TAKE 1 TABLET BY MOUTH TWICE A DAY  . ipratropium-albuterol (DUONEB) 0.5-2.5 (3) MG/3ML SOLN Take 3 mLs by nebulization every 4 (four) hours as needed.  . isosorbide mononitrate (IMDUR) 30 MG 24 hr tablet Take 1 tablet (30 mg total) by mouth daily.  Marland Kitchen KLOR-CON 10 10 MEQ tablet TAKE 1/2 TABLET ONCE DAILY  . lidocaine (LIDODERM) 5 % Place 1 patch onto the skin daily. Remove & Discard patch within 12 hours or as directed by MD  . metoprolol succinate (TOPROL-XL) 25 MG 24 hr tablet TAKE 1/2 TABLET EVERY DAY  . montelukast (SINGULAIR) 10 MG tablet TAKE 1 TABLET BY MOUTH EVERYDAY AT BEDTIME  . nitroGLYCERIN (NITROSTAT) 0.4 MG SL tablet Place 1 tablet (0.4 mg total) under the tongue every 5 (five) minutes x 3 doses as needed for chest pain.  . Omega-3 Fatty Acids (FISH OIL) 1000 MG CAPS Take 1,000 mg by mouth at bedtime.   . OXYGEN Inhale 3.5 L continuous into the lungs.  . predniSONE (DELTASONE) 20 MG tablet Take  2 tablets (40 mg total) by mouth daily with breakfast.  . ranitidine (ZANTAC) 150 MG tablet TAKE 1 TABLET BY MOUTH TWICE DAILY  . sertraline (ZOLOFT) 50 MG tablet TAKE 1 TABLET BY MOUTH EVERY DAY  . simvastatin (ZOCOR) 40 MG tablet TAKE 1 TABLET BY MOUTH EVERY DAY  . vitamin E 400 UNIT capsule Take 400 Units by mouth at bedtime.    No facility-administered encounter medications on file as of 07/30/2018.     Allergies (verified) Patient has no known allergies.   History: Past Medical History:  Diagnosis Date  . Anxiety   . Anxiety and depression   . Arthritis     . Benign neoplasm of esophagus   . CAD (coronary artery disease)   . Cancer [facility])    Skin cancer on back  . Carotid artery disease ([place])   . CHF (congestive heart failure) ([place])   . Chronic gastritis   . Colon, diverticulosis   . COPD (chronic obstructive pulmonary disease) ([place])   . Depression   . GERD (gastroesophageal reflux disease)   . Hiatal hernia   . High frequency hearing loss   . Hyperlipidemia   . IBS (irritable bowel syndrome)   . Idiopathic pulmonary fibrosis   . Internal hemorrhoid   . MI (myocardial infarction) ([place]) 1985  . Other nonthrombocytopenic purpuras   . PONV (postoperative nausea and vomiting)   . PVD (peripheral vascular disease) ([place])   . Shortness of breath    with exertion  . Solitary pulmonary nodule   . Stroke [facility])    Hx: of several mini -strokes  . Tinnitus    chronic  . Vertigo    Past Surgical History:  Procedure Laterality Date  . ANGIOPLASTY  1997, 98, 99  . BREAST SURGERY     Hx; of biopsy  . CARDIAC CATHETERIZATION N/A 08/10/2016   Procedure: Left Heart Cath and Coronary Angiography;  Surgeon: Belva Crome, MD;  Location: [place] [facility];  Service: Cardiovascular;  Laterality: N/A;  . CAROTID ENDARTERECTOMY     left side x 3, saphenous vein graft from left leg  . CATARACT EXTRACTION W/ INTRAOCULAR LENS  IMPLANT, BILATERAL    . COLON SURGERY    . COLONOSCOPY  11-27-01, 10-30-05, 05-24-11   diverticulosis, hemorrhoids  . DENTAL SURGERY     2012   . ENDARTERECTOMY Right 03/03/2014   Procedure: RIGHT CAROTID ENDARTERECTOMY WITH PATCH ANGIOPLASTY;  Surgeon: Rosetta Posner, MD;  Location: [place];  Service: Vascular;  Laterality: Right;  . FOOT SURGERY     bilateral  . SHOULDER SURGERY     left  . TOE SURGERY     right foot  . TONSILLECTOMY    . TUBAL LIGATION    . UPPER GASTROINTESTINAL ENDOSCOPY  11-27-01, 08-18-08   Hiatal hernia, benign esophagus neoplasia, gastritis, tortuous esophagus 43 Maloney dilation performed   Family  History  Problem Relation Age of Onset  . Heart attack Father        deceased at 91, MGF  . Colon cancer Father   . Prostate cancer Father   . Heart disease Father   . Hyperlipidemia Father   . Heart disease Mother        deaceased at age 53  . Colitis Mother   . Stroke Mother   . Hyperlipidemia Mother   . Colitis Sister   . Hyperlipidemia Sister   . Other Brother        deceased age 29; war  .  Hyperlipidemia Brother   . Breast cancer Maternal Aunt        great   . Lung cancer Paternal Aunt        great  . Vasculitis Son   . Hyperlipidemia Son   . Hypertension Son   . Heart attack Son   . Cancer Sister        unknown  . Hyperlipidemia Sister   . Hyperlipidemia Daughter   . Hypertension Daughter   . COPD Neg Hx   . Asthma Neg Hx    Social History   Socioeconomic History  . Marital status: Widowed    Spouse name: Not on file  . Number of children: 6  . Years of education: Not on file  . Highest education level: Not on file  Occupational History  . Occupation: Armed forces operational officer at [employer]: [place]  . Financial resource strain: Not hard at all  . Food insecurity:    Worry: Never true    Inability: Never true  . Transportation needs:    Medical: No    Non-medical: No  Tobacco Use  . Smoking status: Former Smoker    Packs/day: 0.50    Years: 60.00    Pack years: 30.00    Types: Cigarettes    Last attempt to quit: 02/26/2014    Years since quitting: 4.4  . Smokeless tobacco: Never Used  Substance and Sexual Activity  . Alcohol use: No  . Drug use: No  . Sexual activity: Never  Lifestyle  . Physical activity:    Days per week: 0 days    Minutes per session: 0 min  . Stress: Only a little  Relationships  . Social connections:    Talks on phone: More than three times a week    Gets together: More than three times a week    Attends religious service: 1 to 4 times per year    Active member of club or organization: Not on  file    Attends meetings of clubs or organizations: 1 to 4 times per year    Relationship status: Widowed  Other Topics Concern  . Not on file  Social History Narrative   [place] to [place] twice a year to visit sister- 3-4 weeks travel there every year.   Pt does not get regular exercise    Tobacco Counseling Counseling given: Not Answered  Activities of Daily Living In your present state of health, do you have any difficulty performing the following activities: 07/30/2018  Hearing? Y  Vision? N  Difficulty concentrating or making decisions? N  Walking or climbing stairs? Y  Dressing or bathing? Y  Doing errands, shopping? Y  Preparing Food and eating ? Y  Using the Toilet? N  In the past six months, have you accidently leaked urine? N  Do you have problems with loss of bowel control? N  Managing your Medications? N  Managing your Finances? N  Housekeeping or managing your Housekeeping? Y  Some recent data might be hidden     Immunizations and Health Maintenance Immunization History  Administered Date(s) Administered  . Influenza Split 07/30/2011, 07/29/2012, 08/05/2013  . Influenza Whole 10/02/2007, 08/04/2009, 07/29/2010  . Influenza, High Dose Seasonal PF 08/01/2016, 07/19/2017  . Influenza,inj,Quad PF,6+ Mos 06/29/2014, 07/14/2015  . Pneumococcal Conjugate-13 07/14/2015  . Pneumococcal Polysaccharide-23 10/29/2002, 08/06/2013  . Zoster 06/29/2013   Health Maintenance Due  Topic Date Due  .  TETANUS/TDAP  10/18/1947  . DEXA SCAN  10/19/1995  . INFLUENZA VACCINE  05/29/2018    Patient Care Team: Binnie Rail, MD as PCP - General (Internal Medicine) Early, Arvilla Meres, MD as Consulting Physician (Vascular Surgery)  Indicate any recent Medical Services you may have received from other than [facility] providers in the past year (date may be approximate).     Assessment:   This is a routine wellness examination for [place]. Physical  assessment deferred to PCP.   Hearing/Vision screen Hearing Screening Comments: HOH, declined audiology referral at this time Vision Screening Comments: appointment yearly   Dietary issues and exercise activities discussed: Current Exercise Habits: The patient does not participate in regular exercise at present, Exercise limited by: respiratory conditions(s);orthopedic condition(s)  Diet (meal preparation, eat out, water intake, caffeinated beverages, dairy products, fruits and vegetables): in general, a "healthy" diet     Reviewed heart healthy and diabetic diet. Encouraged patient to increase daily water and healthy fluid intake.  Goals    . Patient Stated     Maintain current health status, stay as independent as possible.      Depression Screen PHQ 2/9 Scores 07/30/2018 07/30/2018 07/30/2018 01/15/2017 03/27/2016 02/21/2016 12/28/2013  PHQ - 2 Score 2 0 0 0 0 1 0  PHQ- 9 Score 6 0 - - - - -    Fall Risk Fall Risk  07/30/2018 07/30/2018 01/15/2017 03/27/2016 02/21/2016  Falls in the past year? Yes Yes No Yes Yes  Number falls in past yr: 2 or more 2 or more - 1 1  Injury with Fall? Yes Yes - Yes No  Risk Factor Category  High Fall Risk - - High Fall Risk -  Risk for fall due to : Impaired mobility;Impaired balance/gait Impaired balance/gait - History of fall(s) Impaired balance/gait  Follow up Education provided;Falls prevention discussed - - - Falls evaluation completed   Cognitive Function:       Ad8 score reviewed for issues:  Issues making decisions: no  Less interest in hobbies / activities: no  Repeats questions, stories (family complaining): no  Trouble using ordinary gadgets (microwave, computer, phone):no  Forgets the month or year: no  Mismanaging finances: no  Remembering appts: no  Daily problems with thinking and/or memory: no Ad8 score is= 0  Screening Tests Health Maintenance  Topic Date Due  . TETANUS/TDAP  10/18/1947  . DEXA SCAN  10/19/1995  .  INFLUENZA VACCINE  05/29/2018  . PNA vac Low Risk Adult  Completed     Plan:     I have personally reviewed and noted the following in the patient's chart:   . Medical and social history . Use of alcohol, tobacco or illicit drugs  . Current medications and supplements . Functional ability and status . Nutritional status . Physical activity . Advanced directives . List of other physicians . Vitals . Screenings to include cognitive, depression, and falls . Referrals and appointments  In addition, I have reviewed and discussed with patient certain preventive protocols, quality metrics, and best practice recommendations. A written personalized care plan for preventive services as well as general preventive health recommendations were provided to patient.     Michiel Cowboy, RN   07/30/2018     Medical screening examination/treatment/procedure(s) were performed by non-physician practitioner and as supervising physician I was immediately available for consultation/collaboration. I agree with above. Binnie Rail, MD

## 2018-07-29 NOTE — Progress Notes (Signed)
Subjective:    Patient ID: Linda Kelly, female    DOB: 01-09-1928, 84 y.o.   MRN: [id number]  HPI The patient is here for follow up.  She had a bad night and morning.  Her eyes were foggy last night.  She denies any eye pain or changes in vision, they were just foggy.  She just did not feel well.  This morning she felt very weak and had difficulty getting out of bed, which is not usual.    COPD, pulmonary fibrosis:  She is on oxygen continuously.  She has chronic SOB, occasional dry cough.  She does have some headaches and lightheadedness.  she did see Dr Annamaria Boots last week and had a CXR and BNP and was told she has slightly increased congestion and to use her neb treatments twice a day - she thinks this has helped a little.   Anxiety, depression: She is taking her medication daily as prescribed. She denies any side effects from the medication. She feels her anxiety and depression are well controlled and she is happy with her current dose of medication.   CHF, CAD, Hypertension: She is taking her medication daily. She is compliant with a low sodium diet.  She denies chest pain, palpitations, edema.  Both of her arms hurt - they do not hurt right now.  The upper arms are were the pain is - not the shoulders.  Nothing makes the pain worse. Sitting helps. She has not taken anything for this.    Intermittent back pain:  She uses the lidocaine patches as needed.  She does not have the pain now and has not had the pain in a little bit.        Medications and allergies reviewed with patient and updated if appropriate.  Patient Active Problem List   Diagnosis Date Noted  . Left-sided headache 09/11/2017  . Ischemic cardiomyopathy 03/14/2017  . Pain of left upper extremity 02/18/2017  . Steroid-induced hyperglycemia 01/08/2017  . Anxiety state 01/08/2017  . HCAP (healthcare-associated pneumonia) 01/08/2017  . Increased oxygen demand   . Palliative care by specialist   . Encounter for  hospice care discussion   . Palliative care encounter   . Goals of care, counseling/discussion   . Demand ischemia ([place])   . Chronic respiratory failure with hypoxia ([place]) 11/29/2016  . Episodic lightheadedness 08/28/2016  . Sleep difficulties 08/28/2016  . Chronic combined systolic (congestive) and diastolic (congestive) heart failure ([place])   . Pulmonary fibrosis ([place])   . Non-ST elevation (NSTEMI) myocardial infarction ([place]) 08/08/2016  . Lumbar radiculopathy 08/01/2016  . Left-sided low back pain without sciatica 03/28/2016  . Cephalalgia 03/10/2016  . Overactive bladder 02/21/2016  . Poor balance 02/21/2016  . Chronic lower back pain 02/21/2016  . Carotid stenosis-s/p CEA 2015 03/03/2014  . PVD (peripheral vascular disease) ([place]) 02/04/2014  . Change in bowel habits 05/22/2011  . URINARY INCONTINENCE 12/22/2010  . ABDOMINAL BRUIT 08/10/2010  . FASTING HYPERGLYCEMIA 11/15/2009  . Depression 08/01/2009  . GERD 08/01/2009  . IRRITABLE BOWEL SYNDROME 12/27/2008  . TINNITUS, CHRONIC 11/05/2008  . HEARING LOSS, HIGH FREQUENCY 11/05/2008  . HIATAL HERNIA 08/11/2008  . OTHER NONTHROMBOCYTOPENIC PURPURAS 06/08/2008  . ORTHOSTATIC HYPOTENSION 06/08/2008  . Lung nodule 11/20/2007  . OBESITY, MILD 11/19/2007  . Hyperlipidemia 10/09/2007  . Tobacco abuse, in remission 10/09/2007  . Essential hypertension 10/09/2007  . CAD S/P percutaneous coronary angioplasty 1985 10/09/2007  . COPD mixed type ([place]) 10/09/2007  . VERTIGO 03/31/2007  .  Headache 03/31/2007    Current Outpatient Medications on File Prior to Visit  Medication Sig Dispense Refill  . acetaminophen (TYLENOL) 650 MG CR tablet Take 650 mg by mouth daily.    Marland Kitchen ALPRAZolam (XANAX) 0.25 MG tablet Take 1 tablet (0.25 mg total) by mouth 3 (three) times daily as needed for anxiety. 30 tablet 2  . aspirin EC 81 MG tablet Take 81 mg by mouth at bedtime.     . cholecalciferol (VITAMIN D) 1000 UNITS tablet Take 1,000 Units by mouth  at bedtime.     . clopidogrel (PLAVIX) 75 MG tablet TAKE 1 TABLET BY MOUTH EVERY DAY 90 tablet 1  . furosemide (LASIX) 40 MG tablet TAKE 1 TABLET BY MOUTH TWICE A DAY 180 tablet 2  . ipratropium-albuterol (DUONEB) 0.5-2.5 (3) MG/3ML SOLN Take 3 mLs by nebulization every 4 (four) hours as needed. 360 mL 12  . KLOR-CON 10 10 MEQ tablet TAKE 1/2 TABLET ONCE DAILY 45 tablet 3  . lidocaine (LIDODERM) 5 % Place 1 patch onto the skin daily. Remove & Discard patch within 12 hours or as directed by MD 30 patch 0  . metoprolol succinate (TOPROL-XL) 25 MG 24 hr tablet TAKE 1/2 TABLET EVERY DAY 45 tablet 1  . montelukast (SINGULAIR) 10 MG tablet TAKE 1 TABLET BY MOUTH EVERYDAY AT BEDTIME 90 tablet 0  . nitroGLYCERIN (NITROSTAT) 0.4 MG SL tablet Place 1 tablet (0.4 mg total) under the tongue every 5 (five) minutes x 3 doses as needed for chest pain. 3 tablet 0  . Omega-3 Fatty Acids (FISH OIL) 1000 MG CAPS Take 1,000 mg by mouth at bedtime.     . OXYGEN Inhale 3.5 L continuous into the lungs.    . predniSONE (DELTASONE) 20 MG tablet Take 2 tablets (40 mg total) by mouth daily with breakfast. 14 tablet 0  . ranitidine (ZANTAC) 150 MG tablet TAKE 1 TABLET BY MOUTH TWICE DAILY 180 tablet 1  . sertraline (ZOLOFT) 50 MG tablet TAKE 1 TABLET BY MOUTH EVERY DAY 30 tablet 5  . simvastatin (ZOCOR) 40 MG tablet TAKE 1 TABLET BY MOUTH EVERY DAY 90 tablet 3  . vitamin E 400 UNIT capsule Take 400 Units by mouth at bedtime.     . isosorbide mononitrate (IMDUR) 30 MG 24 hr tablet Take 1 tablet (30 mg total) by mouth daily. 30 tablet 2   No current facility-administered medications on file prior to visit.     Past Medical History:  Diagnosis Date  . Anxiety   . Anxiety and depression   . Arthritis   . Benign neoplasm of esophagus   . CAD (coronary artery disease)   . Cancer [facility])    Skin cancer on back  . Carotid artery disease ([place])   . CHF (congestive heart failure) ([place])   . Chronic gastritis   . Colon,  diverticulosis   . COPD (chronic obstructive pulmonary disease) ([place])   . Depression   . GERD (gastroesophageal reflux disease)   . Hiatal hernia   . High frequency hearing loss   . Hyperlipidemia   . IBS (irritable bowel syndrome)   . Idiopathic pulmonary fibrosis   . Internal hemorrhoid   . MI (myocardial infarction) ([place]) 1985  . Other nonthrombocytopenic purpuras   . PONV (postoperative nausea and vomiting)   . PVD (peripheral vascular disease) ([place])   . Shortness of breath    with exertion  . Solitary pulmonary nodule   . Stroke [facility])    Hx:  of several mini -strokes  . Tinnitus    chronic  . Vertigo     Past Surgical History:  Procedure Laterality Date  . ANGIOPLASTY  1997, 98, 99  . BREAST SURGERY     Hx; of biopsy  . CARDIAC CATHETERIZATION N/A 08/10/2016   Procedure: Left Heart Cath and Coronary Angiography;  Surgeon: Belva Crome, MD;  Location: [place] [facility];  Service: Cardiovascular;  Laterality: N/A;  . CAROTID ENDARTERECTOMY     left side x 3, saphenous vein graft from left leg  . CATARACT EXTRACTION W/ INTRAOCULAR LENS  IMPLANT, BILATERAL    . COLON SURGERY    . COLONOSCOPY  11-27-01, 10-30-05, 05-24-11   diverticulosis, hemorrhoids  . DENTAL SURGERY     2012   . ENDARTERECTOMY Right 03/03/2014   Procedure: RIGHT CAROTID ENDARTERECTOMY WITH PATCH ANGIOPLASTY;  Surgeon: Rosetta Posner, MD;  Location: [facility];  Service: Vascular;  Laterality: Right;  . FOOT SURGERY     bilateral  . SHOULDER SURGERY     left  . TOE SURGERY     right foot  . TONSILLECTOMY    . TUBAL LIGATION    . UPPER GASTROINTESTINAL ENDOSCOPY  11-27-01, 08-18-08   Hiatal hernia, benign esophagus neoplasia, gastritis, tortuous esophagus 30 Maloney dilation performed    Social History   Socioeconomic History  . Marital status: Widowed    Spouse name: Not on file  . Number of children: 6  . Years of education: Not on file  . Highest education level: Not on file  Occupational History    . 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 Financial resource strain: Not on file  . Food insecurity:    Worry: Not on file    Inability: Not on file  . Transportation needs:    Medical: Not on file    Non-medical: Not on file  Tobacco Use  . Smoking status: Former Smoker    Packs/day: 0.50    Years: 60.00    Pack years: 30.00    Types: Cigarettes    Last attempt to quit: 02/26/2014    Years since quitting: 4.4  . Smokeless tobacco: Never Used  Substance and Sexual Activity  . Alcohol use: No  . Drug use: No  . Sexual activity: Never  Lifestyle  . Physical activity:    Days per week: Not on file    Minutes per session: Not on file  . Stress: Not on file  Relationships  . Social connections:    Talks on phone: Not on file    Gets together: Not on file    Attends religious service: Not on file    Active member of club or organization: Not on file    Attends meetings of clubs or organizations: Not on file    Relationship status: Not on file  Other Topics Concern  . Not on file  Social History Narrative   [place] to [place] twice a year to visit sister- 3-4 weeks travel there every year.   Pt does not get regular exercise    Family History  Problem Relation Age of Onset  . Heart attack Father        deceased at 56, MGF  . Colon cancer Father   . Prostate cancer Father   . Heart disease Father   . Hyperlipidemia Father   . Heart disease Mother  deaceased at age 101  . Colitis Mother   . Stroke Mother   . Hyperlipidemia Mother   . Colitis Sister   . Hyperlipidemia Sister   . Other Brother        deceased age 48; war  . Hyperlipidemia Brother   . Breast cancer Maternal Aunt        great   . Lung cancer Paternal Aunt        great  . Vasculitis Son   . Hyperlipidemia Son   . Hypertension Son   . Heart attack Son   . Cancer Sister        unknown  . Hyperlipidemia Sister    . Hyperlipidemia Daughter   . Hypertension Daughter   . COPD Neg Hx   . Asthma Neg Hx     Review of Systems  Constitutional: Negative for chills and fever.  HENT: Positive for sneezing.   Respiratory: Positive for cough (minimal) and shortness of breath. Negative for wheezing.   Cardiovascular: Negative for chest pain, palpitations and leg swelling.  Gastrointestinal: Negative for abdominal pain and diarrhea.  Neurological: Positive for light-headedness and headaches (occ).       Objective:   Vitals:   07/30/18 1326  BP: 98/60  Pulse: 93  Resp: 18  Temp: 98.5 F (36.9 C)  SpO2: 97%   BP Readings from Last 3 Encounters:  07/30/18 98/60  07/23/18 112/68  05/15/18 100/60   Wt Readings from Last 3 Encounters:  07/30/18 140 lb (63.5 kg)  07/23/18 141 lb (64 kg)  05/15/18 143 lb (64.9 kg)   Body mass index is 29.26 kg/m.   Physical Exam    Constitutional: chronically ill appearing.  Mild respiratory distress - SOB with talking  HENT:  Head: Normocephalic and atraumatic.  Neck: Neck supple. No tracheal deviation present. No thyromegaly present.  No cervical lymphadenopathy Cardiovascular: Normal rate, regular rhythm and normal heart sounds.   No murmur heard. No carotid bruit .  No edema Pulmonary/Chest: diffusely decreased breath sounds. Mild respiratory distress - SOB with talking. No has no wheezes. Diffuse dry crackles.  Msk: no upper arm pain with palpation b/l  Skin: Skin is warm and dry. Not diaphoretic.  Psychiatric: Normal mood and affect. Behavior is normal.      Assessment & Plan:   Discussed getting blood work done, but she deferred.  She would like to hold off until her next visit.  I think this is reasonable given her severe pulmonary fibrosis.  She again declined hospice or additional help at home.  She does appear weaker today, which may just be related to have a bad night last night and this morning.   Call if no improvement    See Problem  List for Assessment and Plan of chronic medical problems.

## 2018-07-30 ENCOUNTER — Ambulatory Visit (INDEPENDENT_AMBULATORY_CARE_PROVIDER_SITE_OTHER): Payer: PPO | Admitting: Internal Medicine

## 2018-07-30 ENCOUNTER — Encounter: Payer: Self-pay | Admitting: Internal Medicine

## 2018-07-30 ENCOUNTER — Ambulatory Visit (INDEPENDENT_AMBULATORY_CARE_PROVIDER_SITE_OTHER): Payer: PPO | Admitting: *Deleted

## 2018-07-30 VITALS — BP 98/60 | HR 93 | Ht <= 58 in | Wt 140.0 lb

## 2018-07-30 DIAGNOSIS — J8410 Pulmonary fibrosis, unspecified: Secondary | ICD-10-CM | POA: Diagnosis not present

## 2018-07-30 DIAGNOSIS — F329 Major depressive disorder, single episode, unspecified: Secondary | ICD-10-CM

## 2018-07-30 DIAGNOSIS — Z23 Encounter for immunization: Secondary | ICD-10-CM

## 2018-07-30 DIAGNOSIS — F32A Depression, unspecified: Secondary | ICD-10-CM

## 2018-07-30 DIAGNOSIS — I10 Essential (primary) hypertension: Secondary | ICD-10-CM | POA: Diagnosis not present

## 2018-07-30 DIAGNOSIS — Z0000 Encounter for general adult medical examination without abnormal findings: Secondary | ICD-10-CM | POA: Diagnosis not present

## 2018-07-30 DIAGNOSIS — F411 Generalized anxiety disorder: Secondary | ICD-10-CM | POA: Diagnosis not present

## 2018-07-30 DIAGNOSIS — I5042 Chronic combined systolic (congestive) and diastolic (congestive) heart failure: Secondary | ICD-10-CM | POA: Diagnosis not present

## 2018-07-30 NOTE — Patient Instructions (Signed)
   Flu immunization administered today.     Medications reviewed and updated.  Changes include :   none     Please followup in 6 months   

## 2018-07-30 NOTE — Assessment & Plan Note (Signed)
Controlled, stable Continue current dose of medication  

## 2018-07-30 NOTE — Assessment & Plan Note (Signed)
BP Readings from Last 3 Encounters:  07/30/18 98/60  07/30/18 98/60  07/23/18 112/68   BP on low side, but stable Continue current medications

## 2018-07-30 NOTE — Assessment & Plan Note (Signed)
Severe in nature with SOB that appears increased today with some increased weakness Saw Dr Annamaria Boots last week - CXR stable - increasing neb treatments BID helping Follow up with pulm if needed Continue inhalers

## 2018-07-30 NOTE — Assessment & Plan Note (Signed)
SOB related to pulm fibrosis CXR and BNP did not show significant congestion

## 2018-07-31 ENCOUNTER — Ambulatory Visit (INDEPENDENT_AMBULATORY_CARE_PROVIDER_SITE_OTHER): Payer: PPO | Admitting: Cardiology

## 2018-07-31 ENCOUNTER — Encounter: Payer: Self-pay | Admitting: Cardiology

## 2018-07-31 ENCOUNTER — Encounter

## 2018-07-31 VITALS — BP 96/58 | HR 80 | Ht <= 58 in | Wt 140.0 lb

## 2018-07-31 DIAGNOSIS — E7800 Pure hypercholesterolemia, unspecified: Secondary | ICD-10-CM | POA: Diagnosis not present

## 2018-07-31 DIAGNOSIS — I10 Essential (primary) hypertension: Secondary | ICD-10-CM | POA: Diagnosis not present

## 2018-07-31 DIAGNOSIS — I255 Ischemic cardiomyopathy: Secondary | ICD-10-CM | POA: Diagnosis not present

## 2018-07-31 DIAGNOSIS — I2510 Atherosclerotic heart disease of native coronary artery without angina pectoris: Secondary | ICD-10-CM | POA: Diagnosis not present

## 2018-07-31 DIAGNOSIS — Z9861 Coronary angioplasty status: Secondary | ICD-10-CM

## 2018-07-31 NOTE — Patient Instructions (Signed)
Medication Instructions:   NO CHANGE  Labwork:  Your physician recommends that you return for lab work PRIOR TO EATING  Follow-Up:  Your physician wants you to follow-up in: [place] will receive a reminder letter in the mail two months in advance. If you don't receive a letter, please call our office to schedule the follow-up appointment.   If you need a refill on your cardiac medications before your next appointment, please call your pharmacy.

## 2018-08-01 LAB — LIPID PANEL
CHOLESTEROL TOTAL: 197 mg/dL (ref 100–199)
Chol/HDL Ratio: 5.5 ratio — ABNORMAL HIGH (ref 0.0–4.4)
HDL: 36 mg/dL — ABNORMAL LOW (ref >39)
LDL Calculated: 110 mg/dL — ABNORMAL HIGH (ref 0–99)
Triglycerides: 255 mg/dL — ABNORMAL HIGH (ref 0–149)
VLDL Cholesterol Cal: 51 mg/dL — ABNORMAL HIGH (ref 5–40)

## 2018-08-01 LAB — COMPREHENSIVE METABOLIC PANEL
A/G RATIO: 1.5 (ref 1.2–2.2)
ALBUMIN: 4.1 g/dL (ref 3.5–4.7)
ALK PHOS: 121 IU/L — AB (ref 39–117)
ALT: 10 IU/L (ref 0–32)
AST: 19 IU/L (ref 0–40)
BILIRUBIN TOTAL: 0.7 mg/dL (ref 0.0–1.2)
BUN / CREAT RATIO: 16 (ref 12–28)
BUN: 17 mg/dL (ref 8–27)
CHLORIDE: 95 mmol/L — AB (ref 96–106)
CO2: 27 mmol/L (ref 20–29)
Calcium: 9.1 mg/dL (ref 8.7–10.3)
Creatinine, Ser: 1.07 mg/dL — ABNORMAL HIGH (ref 0.57–1.00)
GFR calc non Af Amer: 47 mL/min/{1.73_m2} — ABNORMAL LOW (ref >59)
GFR, EST AFRICAN AMERICAN: 54 mL/min/{1.73_m2} — AB (ref >59)
GLUCOSE: 89 mg/dL (ref 65–99)
Globulin, Total: 2.7 g/dL (ref 1.5–4.5)
POTASSIUM: 4.6 mmol/L (ref 3.5–5.2)
Sodium: 141 mmol/L (ref 134–144)
TOTAL PROTEIN: 6.8 g/dL (ref 6.0–8.5)

## 2018-08-01 LAB — CBC
Hematocrit: 34.7 % (ref 34.0–46.6)
Hemoglobin: 11.5 g/dL (ref 11.1–15.9)
MCH: 31.3 pg (ref 26.6–33.0)
MCHC: 33.1 g/dL (ref 31.5–35.7)
MCV: 94 fL (ref 79–97)
PLATELETS: 253 10*3/uL (ref 150–450)
RBC: 3.68 x10E6/uL — AB (ref 3.77–5.28)
RDW: 13.8 % (ref 12.3–15.4)
WBC: 10.9 10*3/uL — ABNORMAL HIGH (ref 3.4–10.8)

## 2018-08-03 NOTE — Assessment & Plan Note (Signed)
Slowly progressing, consistent with UIP.  No way to know if this has any connection to her [employer] exposure.  She has opted not to treat and that is reasonable.  Need to watch for possible component of CHF. Plan-update CXR, labs for BNP

## 2018-08-03 NOTE — Assessment & Plan Note (Signed)
We discussed the possibility that some of her interstitial prominence might reflect cardiogenic interstitial edema.  She is followed by cardiology. Plan- BNP

## 2018-08-06 ENCOUNTER — Other Ambulatory Visit: Payer: Self-pay | Admitting: Internal Medicine

## 2018-08-07 ENCOUNTER — Telehealth: Payer: Self-pay | Admitting: *Deleted

## 2018-08-07 DIAGNOSIS — E7800 Pure hypercholesterolemia, unspecified: Secondary | ICD-10-CM

## 2018-08-07 MED ORDER — EZETIMIBE 10 MG PO TABS: 10.0000 mg | ORAL_TABLET | Freq: Every day | ORAL | 3 refills | Status: AC

## 2018-08-07 NOTE — Telephone Encounter (Addendum)
Spoke with Linda Kelly, Aware of dr Jacalyn Lefevre recommendations. New script sent to the pharmacy and Lab orders mailed to the Linda Kelly   ----- Message from Lelon Perla, MD sent at 08/01/2018  7:23 AM EDT ----- Add zetia 10 mg daily; lipids and liver 4 weeks Kirk Ruths

## 2018-08-24 ENCOUNTER — Other Ambulatory Visit: Payer: Self-pay | Admitting: Internal Medicine

## 2018-09-08 ENCOUNTER — Telehealth: Payer: Self-pay | Admitting: *Deleted

## 2018-09-08 NOTE — Telephone Encounter (Signed)
Called in response to a VM the patient left nurse to call her back. Unable to reach patient. LVM for patient to call nurse back and nurse will also call patient back at a later time.

## 2018-09-08 NOTE — Telephone Encounter (Signed)
Patient states she has applied to [employer]. She discovered that it will take around 2 years to process her application and knows she does not have that much time left to live. Patient contacted the foundation and they stated the process will be rushed if a MD will write a letter stating that the patient's expectant time to live is imminent. The patient asked if Dr. Quay Burow could write a letter confirming her imminent demise.  Patient requests that her ID# and DOB be included in the letter and for it to be mailed to her address upon completion.  ID: [id number] DOB:[id number]  [address] [place] [address]

## 2018-09-11 LAB — HEPATIC FUNCTION PANEL
ALT: 6 IU/L (ref 0–32)
AST: 16 IU/L (ref 0–40)
Albumin: 4 g/dL (ref 3.5–4.7)
Alkaline Phosphatase: 93 IU/L (ref 39–117)
Bilirubin Total: 0.5 mg/dL (ref 0.0–1.2)
Bilirubin, Direct: 0.17 mg/dL (ref 0.00–0.40)
TOTAL PROTEIN: 6.8 g/dL (ref 6.0–8.5)

## 2018-09-11 LAB — LIPID PANEL
Chol/HDL Ratio: 4.1 ratio (ref 0.0–4.4)
Cholesterol, Total: 156 mg/dL (ref 100–199)
HDL: 38 mg/dL — AB (ref >39)
LDL Calculated: 69 mg/dL (ref 0–99)
Triglycerides: 247 mg/dL — ABNORMAL HIGH (ref 0–149)
VLDL CHOLESTEROL CAL: 49 mg/dL — AB (ref 5–40)

## 2018-09-11 NOTE — Telephone Encounter (Signed)
Letter written - how does that sound?

## 2018-09-11 NOTE — Telephone Encounter (Signed)
Pt aware letter written and sent in the mail

## 2018-09-18 ENCOUNTER — Other Ambulatory Visit: Payer: Self-pay | Admitting: Internal Medicine

## 2018-09-19 ENCOUNTER — Other Ambulatory Visit: Payer: Self-pay | Admitting: Internal Medicine

## 2018-09-20 ENCOUNTER — Other Ambulatory Visit: Payer: Self-pay | Admitting: Cardiology

## 2018-09-22 NOTE — Progress Notes (Signed)
Subjective:    Patient ID: Linda Kelly, female    DOB: Oct 16, 1928, 84 y.o.   MRN: [id number]  HPI The patient is here for an acute visit.  She has decreased hearing, dizziness and clogged ears. She denies ear pain or cold symptoms.  She has not had dizziness before.  She has not taken anything for it.  Her daughter tried to clean out her ears at home but it did not work - she thinks it made it worse.     Medications and allergies reviewed with patient and updated if appropriate.  Patient Active Problem List   Diagnosis Date Noted  . Left-sided headache 09/11/2017  . Ischemic cardiomyopathy 03/14/2017  . Pain of left upper extremity 02/18/2017  . Steroid-induced hyperglycemia 01/08/2017  . Anxiety state 01/08/2017  . Increased oxygen demand   . Palliative care by specialist   . Encounter for hospice care discussion   . Palliative care encounter   . Goals of care, counseling/discussion   . Demand ischemia ([place])   . Chronic respiratory failure with hypoxia ([place]) 11/29/2016  . Episodic lightheadedness 08/28/2016  . Sleep difficulties 08/28/2016  . Chronic combined systolic (congestive) and diastolic (congestive) heart failure ([place])   . Pulmonary fibrosis ([place])   . Non-ST elevation (NSTEMI) myocardial infarction ([place]) 08/08/2016  . Lumbar radiculopathy 08/01/2016  . Left-sided low back pain without sciatica 03/28/2016  . Cephalalgia 03/10/2016  . Overactive bladder 02/21/2016  . Poor balance 02/21/2016  . Chronic lower back pain 02/21/2016  . Carotid stenosis-s/p CEA 2015 03/03/2014  . PVD (peripheral vascular disease) ([place]) 02/04/2014  . Change in bowel habits 05/22/2011  . URINARY INCONTINENCE 12/22/2010  . ABDOMINAL BRUIT 08/10/2010  . FASTING HYPERGLYCEMIA 11/15/2009  . Depression 08/01/2009  . GERD 08/01/2009  . IRRITABLE BOWEL SYNDROME 12/27/2008  . TINNITUS, CHRONIC 11/05/2008  . HEARING LOSS, HIGH FREQUENCY 11/05/2008  . HIATAL HERNIA 08/11/2008  . ORTHOSTATIC  HYPOTENSION 06/08/2008  . Lung nodule 11/20/2007  . OBESITY, MILD 11/19/2007  . Hyperlipidemia 10/09/2007  . Tobacco abuse, in remission 10/09/2007  . Essential hypertension 10/09/2007  . CAD S/P percutaneous coronary angioplasty 1985 10/09/2007  . COPD mixed type ([place]) 10/09/2007  . VERTIGO 03/31/2007  . Headache 03/31/2007    Current Outpatient Medications on File Prior to Visit  Medication Sig Dispense Refill  . acetaminophen (TYLENOL) 650 MG CR tablet Take 650 mg by mouth daily.    Marland Kitchen aspirin EC 81 MG tablet Take 81 mg by mouth at bedtime.     . cholecalciferol (VITAMIN D) 1000 UNITS tablet Take 1,000 Units by mouth at bedtime.     . clopidogrel (PLAVIX) 75 MG tablet TAKE 1 TABLET BY MOUTH EVERY DAY 90 tablet 1  . ezetimibe (ZETIA) 10 MG tablet Take 1 tablet (10 mg total) by mouth daily. 90 tablet 3  . famotidine (PEPCID) 20 MG tablet Take 1 tablet (20 mg total) by mouth 2 (two) times daily. 180 tablet 1  . furosemide (LASIX) 40 MG tablet TAKE 1 TABLET BY MOUTH TWICE A DAY 180 tablet 2  . ipratropium-albuterol (DUONEB) 0.5-2.5 (3) MG/3ML SOLN Take 3 mLs by nebulization every 4 (four) hours as needed. 360 mL 12  . lidocaine (LIDODERM) 5 % Place 1 patch onto the skin daily. Remove & Discard patch within 12 hours or as directed by MD 30 patch 0  . metoprolol succinate (TOPROL-XL) 25 MG 24 hr tablet TAKE 1/2 TABLET EVERY DAY 45 tablet 1  . montelukast (SINGULAIR)  10 MG tablet TAKE 1 TABLET BY MOUTH EVERYDAY AT BEDTIME 90 tablet 0  . nitroGLYCERIN (NITROSTAT) 0.4 MG SL tablet DISSOLVE 1 TABLET UNDER THE TONGUE EVERY 5 MINUTES FOR 3 DOSES AS NEEDED FOR CHEST PAIN. 25 tablet 0  . Omega-3 Fatty Acids (FISH OIL) 1000 MG CAPS Take 1,000 mg by mouth at bedtime.     . OXYGEN Inhale 3.5 L continuous into the lungs.    . potassium chloride (K-DUR) 10 MEQ tablet TAKE 1/2 TABLET ONCE DAILY 45 tablet 1  . predniSONE (DELTASONE) 20 MG tablet Take 2 tablets (40 mg total) by mouth daily with breakfast.  14 tablet 0  . sertraline (ZOLOFT) 50 MG tablet TAKE 1 TABLET BY MOUTH EVERY DAY 90 tablet 2  . simvastatin (ZOCOR) 40 MG tablet TAKE 1 TABLET BY MOUTH EVERY DAY 90 tablet 3  . vitamin E 400 UNIT capsule Take 400 Units by mouth at bedtime.     . isosorbide mononitrate (IMDUR) 30 MG 24 hr tablet Take 1 tablet (30 mg total) by mouth daily. 30 tablet 2   No current facility-administered medications on file prior to visit.     Past Medical History:  Diagnosis Date  . Anxiety   . Anxiety and depression   . Arthritis   . Benign neoplasm of esophagus   . CAD (coronary artery disease)   . Cancer [facility])    Skin cancer on back  . Carotid artery disease ([place])   . CHF (congestive heart failure) ([place])   . Chronic gastritis   . Colon, diverticulosis   . COPD (chronic obstructive pulmonary disease) ([place])   . Depression   . GERD (gastroesophageal reflux disease)   . Hiatal hernia   . High frequency hearing loss   . Hyperlipidemia   . IBS (irritable bowel syndrome)   . Idiopathic pulmonary fibrosis   . Internal hemorrhoid   . MI (myocardial infarction) ([place]) 1985  . Other nonthrombocytopenic purpuras   . PONV (postoperative nausea and vomiting)   . PVD (peripheral vascular disease) ([place])   . Shortness of breath    with exertion  . Solitary pulmonary nodule   . Stroke [facility])    Hx: of several mini -strokes  . Tinnitus    chronic  . Vertigo     Past Surgical History:  Procedure Laterality Date  . ANGIOPLASTY  1997, 98, 99  . BREAST SURGERY     Hx; of biopsy  . CARDIAC CATHETERIZATION N/A 08/10/2016   Procedure: Left Heart Cath and Coronary Angiography;  Surgeon: Belva Crome, MD;  Location: [facility];  Service: Cardiovascular;  Laterality: N/A;  . CAROTID ENDARTERECTOMY     left side x 3, saphenous vein graft from left leg  . CATARACT EXTRACTION W/ INTRAOCULAR LENS  IMPLANT, BILATERAL    . COLON SURGERY    . COLONOSCOPY  11-27-01, 10-30-05, 05-24-11   diverticulosis,  hemorrhoids  . DENTAL SURGERY     2012   . ENDARTERECTOMY Right 03/03/2014   Procedure: RIGHT CAROTID ENDARTERECTOMY WITH PATCH ANGIOPLASTY;  Surgeon: Rosetta Posner, MD;  Location: [facility];  Service: Vascular;  Laterality: Right;  . FOOT SURGERY     bilateral  . SHOULDER SURGERY     left  . TOE SURGERY     right foot  . TONSILLECTOMY    . TUBAL LIGATION    . UPPER GASTROINTESTINAL ENDOSCOPY  11-27-01, 08-18-08   Hiatal hernia, benign esophagus neoplasia, gastritis, tortuous esophagus 54 Venia Minks  dilation performed    Social History   Socioeconomic History  . Marital status: Widowed    Spouse name: Not on file  . Number of children: 6  . Years of education: Not on file  . Highest education level: Not on file  Occupational History  . Occupation: Armed forces operational officer at [employer]: [place]  . Financial resource strain: Not hard at all  . Food insecurity:    Worry: Never true    Inability: Never true  . Transportation needs:    Medical: No    Non-medical: No  Tobacco Use  . Smoking status: Former Smoker    Packs/day: 0.50    Years: 60.00    Pack years: 30.00    Types: Cigarettes    Last attempt to quit: 02/26/2014    Years since quitting: 4.5  . Smokeless tobacco: Never Used  Substance and Sexual Activity  . Alcohol use: No  . Drug use: No  . Sexual activity: Never  Lifestyle  . Physical activity:    Days per week: 0 days    Minutes per session: 0 min  . Stress: Only a little  Relationships  . Social connections:    Talks on phone: More than three times a week    Gets together: More than three times a week    Attends religious service: 1 to 4 times per year    Active member of club or organization: Not on file    Attends meetings of clubs or organizations: 1 to 4 times per year    Relationship status: Widowed  Other Topics Concern  . Not on file  Social History Narrative   [place] to [place] twice a  year to visit sister- 3-4 weeks travel there every year.   Pt does not get regular exercise    Family History  Problem Relation Age of Onset  . Heart attack Father        deceased at 37, MGF  . Colon cancer Father   . Prostate cancer Father   . Heart disease Father   . Hyperlipidemia Father   . Heart disease Mother        deaceased at age 46  . Colitis Mother   . Stroke Mother   . Hyperlipidemia Mother   . Colitis Sister   . Hyperlipidemia Sister   . Other Brother        deceased age 36; war  . Hyperlipidemia Brother   . Breast cancer Maternal Aunt        great   . Lung cancer Paternal Aunt        great  . Vasculitis Son   . Hyperlipidemia Son   . Hypertension Son   . Heart attack Son   . Cancer Sister        unknown  . Hyperlipidemia Sister   . Hyperlipidemia Daughter   . Hypertension Daughter   . COPD Neg Hx   . Asthma Neg Hx     Review of Systems  Constitutional: Negative for chills and fever.  HENT: Positive for hearing loss. Negative for congestion, ear pain, sinus pain and sore throat.   Respiratory: Negative for cough.   Neurological: Positive for dizziness and light-headedness. Negative for headaches.       Objective:   Vitals:   09/23/18 1305  BP: 128/80  Pulse: (!) 102  Resp: 18  Temp: 98.2 F (36.8 C)  SpO2: 91%   BP Readings from Last 3 Encounters:  09/23/18 128/80  07/31/18 (!) 96/58  07/30/18 98/60   Wt Readings from Last 3 Encounters:  09/23/18 137 lb 12.8 oz (62.5 kg)  07/31/18 140 lb (63.5 kg)  07/30/18 140 lb (63.5 kg)   Body mass index is 28.8 kg/m.   Physical Exam  Constitutional: No distress.  Chronically ill appearing on oxygen  HENT:  Head: Normocephalic and atraumatic.  Right Ear: External ear normal.  Left Ear: External ear normal.  Mouth/Throat: Oropharynx is clear and moist.  Bilateral ear canals with excessive cerumen but no erythema, TM's not visualized  Eyes: Conjunctivae are normal.  Neck: Neck supple. No  tracheal deviation present. No thyromegaly present.  Musculoskeletal: She exhibits no edema.  Lymphadenopathy:    She has no cervical adenopathy.  Neurological:  Nonfocal  Skin: Skin is warm and dry. She is not diaphoretic.      PRE-PROCEDURE EXAM: bilateral TMs cannot be visualized due to total occlusion/impaction of the ear canal. PROCEDURE INDICATION: remove wax to visualize ear drum & relieve discomfort CONSENT:  Verbal  PROCEDURE NOTE:   RIGHT EAR:  The CMA used a metal wax curette under direct vision with an otoscope to free the wax bolus from the ear wall and then successfully removed a small bit of wax. The ear was then irrigated with warm water to remove the remaining wax. POST- PROCEDURE EXAM: TMs successfully visualized and found to have no erythema.  There is moderate erythema and small amount of blood in the canal - this is secondary to the procedure  LEFT EAR:  The CMA used a metal wax curette under direct vision with an otoscope to free the wax bolus from the ear wall and then successfully removed a small bit of wax. The ear was then irrigated with warm water to remove the remaining wax. POST- PROCEDURE EXAM: TMs successfully visualized and found to have no erythema.  Ear canal with minimal erythema and very small amount of blood.     She denies ear canal or ear pain after the procedure and feels her hearing has improved.    Assessment & Plan:    See Problem List for Assessment and Plan of chronic medical problems.

## 2018-09-23 ENCOUNTER — Encounter: Payer: Self-pay | Admitting: Internal Medicine

## 2018-09-23 ENCOUNTER — Ambulatory Visit (INDEPENDENT_AMBULATORY_CARE_PROVIDER_SITE_OTHER): Payer: PPO | Admitting: Internal Medicine

## 2018-09-23 VITALS — BP 128/80 | HR 102 | Temp 98.2°F | Resp 18 | Ht <= 58 in | Wt 137.8 lb

## 2018-09-23 DIAGNOSIS — F411 Generalized anxiety disorder: Secondary | ICD-10-CM

## 2018-09-23 DIAGNOSIS — R42 Dizziness and giddiness: Secondary | ICD-10-CM

## 2018-09-23 DIAGNOSIS — H6123 Impacted cerumen, bilateral: Secondary | ICD-10-CM | POA: Insufficient documentation

## 2018-09-23 MED ORDER — ALPRAZOLAM 0.25 MG PO TABS: 0.2500 mg | ORAL_TABLET | Freq: Three times a day (TID) | ORAL | 3 refills | Status: DC | PRN

## 2018-09-23 NOTE — Assessment & Plan Note (Signed)
She has had dizziness in the past per her chart May be BPPV or related to excessive cerumen She deferred meclizine - concern about side effect of drowsiness Takes xanax as needed for anxiety, but has not taken it in a while - will renew - can try for dizziness and anxiety Call if no improvement

## 2018-09-23 NOTE — Assessment & Plan Note (Signed)
Will renew xanax - has not used it in a while, but would like to have it if needed - it was very effective Xanax renewed - TID prn for anxiety

## 2018-09-23 NOTE — Patient Instructions (Signed)
Your ears were cleaned out today.   A refill for xanax (alprazolam) was sent to your pharmacy.  Use this up to three times a day only as needed for your dizziness or anxiety.

## 2018-09-23 NOTE — Assessment & Plan Note (Addendum)
B/l ears cleaned out with lavage and use of curette  Hearing improved, no pain after Mild-moderate irritation in ear canals after procedure but she is asymptomatic - no treatment necessary at this time - she will call if she develops any ear discomfort

## 2018-09-24 ENCOUNTER — Other Ambulatory Visit: Payer: Self-pay

## 2018-09-24 MED ORDER — IPRATROPIUM-ALBUTEROL 0.5-2.5 (3) MG/3ML IN SOLN: 3.0000 mL | RESPIRATORY_TRACT | 12 refills | Status: AC | PRN

## 2018-09-29 ENCOUNTER — Other Ambulatory Visit: Payer: Self-pay | Admitting: Internal Medicine

## 2018-09-29 DIAGNOSIS — J309 Allergic rhinitis, unspecified: Secondary | ICD-10-CM

## 2018-10-02 ENCOUNTER — Emergency Department (HOSPITAL_COMMUNITY): Payer: Commercial Managed Care - PPO

## 2018-10-02 ENCOUNTER — Inpatient Hospital Stay (HOSPITAL_COMMUNITY)
Admission: EM | Admit: 2018-10-02 | Discharge: 2018-10-11 | DRG: 871 | Disposition: A | Discharge status: Home Health | Payer: Commercial Managed Care - PPO | Attending: Family Medicine | Admitting: Family Medicine

## 2018-10-02 DIAGNOSIS — J84112 Idiopathic pulmonary fibrosis: Secondary | ICD-10-CM | POA: Diagnosis present

## 2018-10-02 DIAGNOSIS — I77819 Aortic ectasia, unspecified site: Secondary | ICD-10-CM | POA: Diagnosis present

## 2018-10-02 DIAGNOSIS — Z9861 Coronary angioplasty status: Secondary | ICD-10-CM | POA: Diagnosis not present

## 2018-10-02 DIAGNOSIS — Z823 Family history of stroke: Secondary | ICD-10-CM

## 2018-10-02 DIAGNOSIS — Z7952 Long term (current) use of systemic steroids: Secondary | ICD-10-CM

## 2018-10-02 DIAGNOSIS — I2582 Chronic total occlusion of coronary artery: Secondary | ICD-10-CM | POA: Diagnosis present

## 2018-10-02 DIAGNOSIS — N390 Urinary tract infection, site not specified: Secondary | ICD-10-CM | POA: Diagnosis present

## 2018-10-02 DIAGNOSIS — Z961 Presence of intraocular lens: Secondary | ICD-10-CM | POA: Diagnosis present

## 2018-10-02 DIAGNOSIS — I110 Hypertensive heart disease with heart failure: Secondary | ICD-10-CM | POA: Diagnosis present

## 2018-10-02 DIAGNOSIS — R748 Abnormal levels of other serum enzymes: Secondary | ICD-10-CM | POA: Diagnosis present

## 2018-10-02 DIAGNOSIS — Z7902 Long term (current) use of antithrombotics/antiplatelets: Secondary | ICD-10-CM

## 2018-10-02 DIAGNOSIS — A419 Sepsis, unspecified organism: Secondary | ICD-10-CM | POA: Diagnosis present

## 2018-10-02 DIAGNOSIS — Z803 Family history of malignant neoplasm of breast: Secondary | ICD-10-CM

## 2018-10-02 DIAGNOSIS — D696 Thrombocytopenia, unspecified: Secondary | ICD-10-CM | POA: Diagnosis present

## 2018-10-02 DIAGNOSIS — R7989 Other specified abnormal findings of blood chemistry: Secondary | ICD-10-CM | POA: Diagnosis not present

## 2018-10-02 DIAGNOSIS — B9620 Unspecified Escherichia coli [E. coli] as the cause of diseases classified elsewhere: Secondary | ICD-10-CM | POA: Diagnosis present

## 2018-10-02 DIAGNOSIS — Z8249 Family history of ischemic heart disease and other diseases of the circulatory system: Secondary | ICD-10-CM

## 2018-10-02 DIAGNOSIS — I2510 Atherosclerotic heart disease of native coronary artery without angina pectoris: Secondary | ICD-10-CM

## 2018-10-02 DIAGNOSIS — I739 Peripheral vascular disease, unspecified: Secondary | ICD-10-CM | POA: Diagnosis present

## 2018-10-02 DIAGNOSIS — N179 Acute kidney failure, unspecified: Secondary | ICD-10-CM | POA: Diagnosis present

## 2018-10-02 DIAGNOSIS — F411 Generalized anxiety disorder: Secondary | ICD-10-CM | POA: Diagnosis not present

## 2018-10-02 DIAGNOSIS — R112 Nausea with vomiting, unspecified: Secondary | ICD-10-CM | POA: Diagnosis not present

## 2018-10-02 DIAGNOSIS — R6520 Severe sepsis without septic shock: Secondary | ICD-10-CM | POA: Diagnosis present

## 2018-10-02 DIAGNOSIS — Z87891 Personal history of nicotine dependence: Secondary | ICD-10-CM

## 2018-10-02 DIAGNOSIS — Z9851 Tubal ligation status: Secondary | ICD-10-CM

## 2018-10-02 DIAGNOSIS — I959 Hypotension, unspecified: Secondary | ICD-10-CM | POA: Diagnosis present

## 2018-10-02 DIAGNOSIS — I5041 Acute combined systolic (congestive) and diastolic (congestive) heart failure: Secondary | ICD-10-CM | POA: Diagnosis not present

## 2018-10-02 DIAGNOSIS — I255 Ischemic cardiomyopathy: Secondary | ICD-10-CM | POA: Diagnosis present

## 2018-10-02 DIAGNOSIS — Z9981 Dependence on supplemental oxygen: Secondary | ICD-10-CM

## 2018-10-02 DIAGNOSIS — H9190 Unspecified hearing loss, unspecified ear: Secondary | ICD-10-CM | POA: Diagnosis present

## 2018-10-02 DIAGNOSIS — I5043 Acute on chronic combined systolic (congestive) and diastolic (congestive) heart failure: Secondary | ICD-10-CM | POA: Diagnosis present

## 2018-10-02 DIAGNOSIS — R6521 Severe sepsis with septic shock: Secondary | ICD-10-CM | POA: Diagnosis present

## 2018-10-02 DIAGNOSIS — Z801 Family history of malignant neoplasm of trachea, bronchus and lung: Secondary | ICD-10-CM

## 2018-10-02 DIAGNOSIS — J9621 Acute and chronic respiratory failure with hypoxia: Secondary | ICD-10-CM | POA: Diagnosis present

## 2018-10-02 DIAGNOSIS — K219 Gastro-esophageal reflux disease without esophagitis: Secondary | ICD-10-CM | POA: Diagnosis present

## 2018-10-02 DIAGNOSIS — Z79899 Other long term (current) drug therapy: Secondary | ICD-10-CM

## 2018-10-02 DIAGNOSIS — Z7982 Long term (current) use of aspirin: Secondary | ICD-10-CM

## 2018-10-02 DIAGNOSIS — Z66 Do not resuscitate: Secondary | ICD-10-CM | POA: Diagnosis present

## 2018-10-02 DIAGNOSIS — E860 Dehydration: Secondary | ICD-10-CM | POA: Diagnosis present

## 2018-10-02 DIAGNOSIS — R0602 Shortness of breath: Secondary | ICD-10-CM

## 2018-10-02 DIAGNOSIS — Z9842 Cataract extraction status, left eye: Secondary | ICD-10-CM

## 2018-10-02 DIAGNOSIS — K7200 Acute and subacute hepatic failure without coma: Secondary | ICD-10-CM | POA: Diagnosis present

## 2018-10-02 DIAGNOSIS — J449 Chronic obstructive pulmonary disease, unspecified: Secondary | ICD-10-CM | POA: Diagnosis present

## 2018-10-02 DIAGNOSIS — I252 Old myocardial infarction: Secondary | ICD-10-CM

## 2018-10-02 DIAGNOSIS — Z800 Family history of malignant neoplasm of digestive organs: Secondary | ICD-10-CM

## 2018-10-02 DIAGNOSIS — E785 Hyperlipidemia, unspecified: Secondary | ICD-10-CM | POA: Diagnosis present

## 2018-10-02 DIAGNOSIS — R778 Other specified abnormalities of plasma proteins: Secondary | ICD-10-CM | POA: Diagnosis present

## 2018-10-02 DIAGNOSIS — R9431 Abnormal electrocardiogram [ECG] [EKG]: Secondary | ICD-10-CM | POA: Diagnosis present

## 2018-10-02 DIAGNOSIS — N189 Chronic kidney disease, unspecified: Secondary | ICD-10-CM | POA: Diagnosis not present

## 2018-10-02 DIAGNOSIS — I5021 Acute systolic (congestive) heart failure: Secondary | ICD-10-CM | POA: Diagnosis not present

## 2018-10-02 DIAGNOSIS — K2950 Unspecified chronic gastritis without bleeding: Secondary | ICD-10-CM | POA: Diagnosis present

## 2018-10-02 DIAGNOSIS — Z9841 Cataract extraction status, right eye: Secondary | ICD-10-CM

## 2018-10-02 DIAGNOSIS — Z8673 Personal history of transient ischemic attack (TIA), and cerebral infarction without residual deficits: Secondary | ICD-10-CM

## 2018-10-02 DIAGNOSIS — J8410 Pulmonary fibrosis, unspecified: Secondary | ICD-10-CM | POA: Diagnosis not present

## 2018-10-02 DIAGNOSIS — Z8042 Family history of malignant neoplasm of prostate: Secondary | ICD-10-CM

## 2018-10-02 DIAGNOSIS — Z8349 Family history of other endocrine, nutritional and metabolic diseases: Secondary | ICD-10-CM

## 2018-10-02 DIAGNOSIS — Z85828 Personal history of other malignant neoplasm of skin: Secondary | ICD-10-CM

## 2018-10-02 LAB — CBC WITH DIFFERENTIAL/PLATELET
Abs Immature Granulocytes: 0.14 10*3/uL — ABNORMAL HIGH (ref 0.00–0.07)
Basophils Absolute: 0 10*3/uL (ref 0.0–0.1)
Basophils Relative: 0 %
Eosinophils Absolute: 0 10*3/uL (ref 0.0–0.5)
Eosinophils Relative: 0 %
HCT: 37.4 % (ref 36.0–46.0)
Hemoglobin: 11.6 g/dL — ABNORMAL LOW (ref 12.0–15.0)
IMMATURE GRANULOCYTES: 1 %
LYMPHS PCT: 8 %
Lymphs Abs: 1.2 10*3/uL (ref 0.7–4.0)
MCH: 30.6 pg (ref 26.0–34.0)
MCHC: 31 g/dL (ref 30.0–36.0)
MCV: 98.7 fL (ref 80.0–100.0)
Monocytes Absolute: 1.2 10*3/uL — ABNORMAL HIGH (ref 0.1–1.0)
Monocytes Relative: 8 %
Neutro Abs: 12.2 10*3/uL — ABNORMAL HIGH (ref 1.7–7.7)
Neutrophils Relative %: 83 %
Platelets: 143 10*3/uL — ABNORMAL LOW (ref 150–400)
RBC: 3.79 MIL/uL — ABNORMAL LOW (ref 3.87–5.11)
RDW: 15 % (ref 11.5–15.5)
WBC: 14.8 10*3/uL — ABNORMAL HIGH (ref 4.0–10.5)
nRBC: 0.1 % (ref 0.0–0.2)

## 2018-10-02 LAB — COMPREHENSIVE METABOLIC PANEL
ALT: 1406 U/L — ABNORMAL HIGH (ref 0–44)
ANION GAP: 23 — AB (ref 5–15)
AST: 1823 U/L — ABNORMAL HIGH (ref 15–41)
Albumin: 3.9 g/dL (ref 3.5–5.0)
Alkaline Phosphatase: 78 U/L (ref 38–126)
BUN: 26 mg/dL — ABNORMAL HIGH (ref 8–23)
CO2: 22 mmol/L (ref 22–32)
Calcium: 8.8 mg/dL — ABNORMAL LOW (ref 8.9–10.3)
Chloride: 98 mmol/L (ref 98–111)
Creatinine, Ser: 2.31 mg/dL — ABNORMAL HIGH (ref 0.44–1.00)
GFR calc Af Amer: 21 mL/min — ABNORMAL LOW (ref >60)
GFR calc non Af Amer: 18 mL/min — ABNORMAL LOW (ref >60)
Glucose, Bld: 143 mg/dL — ABNORMAL HIGH (ref 70–99)
Potassium: 4 mmol/L (ref 3.5–5.1)
Sodium: 143 mmol/L (ref 135–145)
Total Bilirubin: 1.3 mg/dL — ABNORMAL HIGH (ref 0.3–1.2)
Total Protein: 7.4 g/dL (ref 6.5–8.1)

## 2018-10-02 LAB — I-STAT CG4 LACTIC ACID, ED
Lactic Acid, Venous: 6.5 mmol/L (ref 0.5–1.9)
Lactic Acid, Venous: 8.5 mmol/L (ref 0.5–1.9)

## 2018-10-02 LAB — I-STAT ARTERIAL BLOOD GAS, ED
Acid-base deficit: 6 mmol/L — ABNORMAL HIGH (ref 0.0–2.0)
Bicarbonate: 19 mmol/L — ABNORMAL LOW (ref 20.0–28.0)
O2 Saturation: 89 %
Patient temperature: 98
TCO2: 20 mmol/L — ABNORMAL LOW (ref 22–32)
pCO2 arterial: 34.5 mmHg (ref 32.0–48.0)
pH, Arterial: 7.348 — ABNORMAL LOW (ref 7.350–7.450)
pO2, Arterial: 57 mmHg — ABNORMAL LOW (ref 83.0–108.0)

## 2018-10-02 LAB — LIPASE, BLOOD: Lipase: 28 U/L (ref 11–51)

## 2018-10-02 LAB — URINALYSIS, ROUTINE W REFLEX MICROSCOPIC
Bilirubin Urine: NEGATIVE
Glucose, UA: NEGATIVE mg/dL
Ketones, ur: NEGATIVE mg/dL
Nitrite: NEGATIVE
Protein, ur: NEGATIVE mg/dL
Specific Gravity, Urine: 1.014 (ref 1.005–1.030)
pH: 5 (ref 5.0–8.0)

## 2018-10-02 MED ORDER — SODIUM CHLORIDE 0.9 % IV BOLUS
2000.0000 mL | Freq: Once | INTRAVENOUS | Status: AC
  Administered 2018-10-02: 2000 mL via INTRAVENOUS

## 2018-10-02 MED ORDER — METRONIDAZOLE IN NACL 5-0.79 MG/ML-% IV SOLN
500.0000 mg | Freq: Three times a day (TID) | INTRAVENOUS | Status: DC
  Administered 2018-10-03: 500 mg via INTRAVENOUS
  Filled 2018-10-02: qty 100

## 2018-10-02 MED ORDER — ALPRAZOLAM 0.25 MG PO TABS
0.2500 mg | ORAL_TABLET | ORAL | Status: AC
  Administered 2018-10-02: 0.25 mg via ORAL
  Filled 2018-10-02: qty 1

## 2018-10-02 MED ORDER — ONDANSETRON HCL 4 MG/2ML IJ SOLN
4.0000 mg | Freq: Once | INTRAMUSCULAR | Status: AC
  Administered 2018-10-02: 4 mg via INTRAVENOUS

## 2018-10-02 MED ORDER — DEXTROSE 5 % IV SOLN
500.0000 mg | INTRAVENOUS | Status: DC
  Filled 2018-10-02: qty 0.5

## 2018-10-02 MED ORDER — IPRATROPIUM-ALBUTEROL 0.5-2.5 (3) MG/3ML IN SOLN
3.0000 mL | RESPIRATORY_TRACT | Status: AC
  Administered 2018-10-03: 3 mL via RESPIRATORY_TRACT
  Filled 2018-10-02: qty 3

## 2018-10-02 MED ORDER — ONDANSETRON HCL 4 MG/2ML IJ SOLN
INTRAMUSCULAR | Status: AC
  Filled 2018-10-02: qty 2

## 2018-10-02 MED ORDER — VANCOMYCIN HCL 10 G IV SOLR
1250.0000 mg | INTRAVENOUS | Status: AC
  Administered 2018-10-02: 1250 mg via INTRAVENOUS
  Filled 2018-10-02: qty 1250

## 2018-10-02 MED ORDER — SODIUM CHLORIDE 0.9 % IV BOLUS
500.0000 mL | Freq: Once | INTRAVENOUS | Status: AC
  Administered 2018-10-02: 500 mL via INTRAVENOUS

## 2018-10-02 MED ORDER — ACETAMINOPHEN 325 MG PO TABS: 325.0000 mg | ORAL_TABLET | Freq: Once | ORAL | Status: DC

## 2018-10-02 MED ORDER — SODIUM CHLORIDE 0.9 % IV SOLN
2.0000 g | Freq: Once | INTRAVENOUS | Status: AC
  Administered 2018-10-02: 2 g via INTRAVENOUS
  Filled 2018-10-02: qty 2

## 2018-10-02 MED ORDER — VANCOMYCIN HCL IN DEXTROSE 1-5 GM/200ML-% IV SOLN: 1000.0000 mg | Freq: Once | INTRAVENOUS | Status: DC

## 2018-10-02 MED ORDER — ACETAMINOPHEN 325 MG PO TABS
650.0000 mg | ORAL_TABLET | Freq: Once | ORAL | Status: AC
  Administered 2018-10-02: 650 mg via ORAL
  Filled 2018-10-02: qty 2

## 2018-10-02 MED ORDER — VANCOMYCIN HCL 500 MG IV SOLR: 500.0000 mg | INTRAVENOUS | Status: DC

## 2018-10-02 NOTE — ED Notes (Signed)
Provider notified of not getting a good O2 reading and alerted of the patients ABG results

## 2018-10-02 NOTE — Progress Notes (Signed)
Pharmacy Antibiotic Note  Linda Kelly is a 84 y.o. female admitted on 10/02/2018 with sepsis.  Pharmacy has been consulted for Vanco/Cefepime dosing.  CC/HPI: N/V, lethargy, pale. Significantly elevated LFT's AST/ALT 1823/1406, Tbili 1.3, Scr elevated 2.31  ID: r/o sepsis. Temp 101.4. WBC 14.8, LA 6.5,  Vanco 12/5>> Cefepime 12/5>>  Vancomycin  500 mg IV Q 36 hrs. Goal AUC 400-550. Expected AUC: 544.9 SCr used: 2.31  Plan: Vanco 1250mg  IV x 1 in ED then 500mg  IV q 36 hrs. Cefepime 2g IV x 1 in ED then 500mg  IV q 24h      Temp (24hrs), Avg:99.8 F (37.7 C), Min:98.1 F (36.7 C), Max:101.4 F (38.6 C)  Recent Labs  Lab 10/02/18 1928 10/02/18 1946  WBC 14.8*  --   CREATININE 2.31*  --   LATICACIDVEN  --  6.50*    Estimated Creatinine Clearance: 13.4 mL/min (A) (by C-G formula based on SCr of 2.31 mg/dL (H)).    No Known Allergies  Noella Kipnis S. Alford Highland, PharmD, [place] Clinical Staff Pharmacist Eilene Ghazi [place] 10/02/2018 9:03 PM

## 2018-10-02 NOTE — ED Notes (Signed)
Respiratory called to help out with obtaining accurate O2 sat

## 2018-10-02 NOTE — ED Notes (Signed)
Patient transported to CT 

## 2018-10-02 NOTE — ED Notes (Signed)
ED Provider at bedside. 

## 2018-10-02 NOTE — ED Notes (Signed)
Provider at bedside and RN expressed concern. Orders received for a blood gas. Family contact  information relayed to provider

## 2018-10-02 NOTE — ED Triage Notes (Signed)
Pt arrived via [facility] from home c/o n/v x24 hours. Pt presents lethargic and pale. EMS v/s HR 104, rr 36, cbg 152, 113/77.

## 2018-10-02 NOTE — ED Notes (Signed)
MD notified of inability to get accurate O2 sat

## 2018-10-02 NOTE — ED Notes (Signed)
Bladder scanner completed. Result: 175mL

## 2018-10-02 NOTE — ED Provider Notes (Signed)
[facility] EMERGENCY DEPARTMENT Provider Note   CSN: [id number] Arrival date & time: 10/02/18  1830     History   Chief Complaint Chief Complaint  Patient presents with  . Nausea  . Emesis    HPI Linda Kelly is a 84 y.o. female with PMH pulmonary fibrosis, mixed COPD, chronic hypoxic respiratory failure on 4.5 L O2, and diverticulitis presenting today with productive cough for the past few weeks with onset of nausea and vomiting today and abdominal pain for the past few days. Bowel movements have been normal for her although frequently has diarrhea. She endorses fever and chills. She has not been able to eat today and has drank very little fluids. She states she has been unable to urinate since yesterday but has not had dysuria, hematuria. Denies chest pain, dizziness. She is alert and orientedx3.    HPI  Past Medical History:  Diagnosis Date  . Anxiety   . Anxiety and depression   . Arthritis   . Benign neoplasm of esophagus   . CAD (coronary artery disease)   . Cancer [facility])    Skin cancer on back  . Carotid artery disease ([place])   . CHF (congestive heart failure) ([place])   . Chronic gastritis   . Colon, diverticulosis   . COPD (chronic obstructive pulmonary disease) ([place])   . Depression   . GERD (gastroesophageal reflux disease)   . Hiatal hernia   . High frequency hearing loss   . Hyperlipidemia   . IBS (irritable bowel syndrome)   . Idiopathic pulmonary fibrosis   . Internal hemorrhoid   . MI (myocardial infarction) ([place]) 1985  . Other nonthrombocytopenic purpuras   . PONV (postoperative nausea and vomiting)   . PVD (peripheral vascular disease) ([place])   . Shortness of breath    with exertion  . Solitary pulmonary nodule   . Stroke [facility])    Hx: of several mini -strokes  . Tinnitus    chronic  . Vertigo     Patient Active Problem List   Diagnosis Date Noted  . Excessive cerumen in ear canal, bilateral 09/23/2018  . Left-sided headache  09/11/2017  . Ischemic cardiomyopathy 03/14/2017  . Pain of left upper extremity 02/18/2017  . Steroid-induced hyperglycemia 01/08/2017  . Anxiety state 01/08/2017  . Increased oxygen demand   . Palliative care by specialist   . Encounter for hospice care discussion   . Palliative care encounter   . Goals of care, counseling/discussion   . Demand ischemia ([place])   . Chronic respiratory failure with hypoxia ([place]) 11/29/2016  . Episodic lightheadedness 08/28/2016  . Sleep difficulties 08/28/2016  . Chronic combined systolic (congestive) and diastolic (congestive) heart failure ([place])   . Pulmonary fibrosis ([place])   . Non-ST elevation (NSTEMI) myocardial infarction ([place]) 08/08/2016  . Lumbar radiculopathy 08/01/2016  . Left-sided low back pain without sciatica 03/28/2016  . Cephalalgia 03/10/2016  . Overactive bladder 02/21/2016  . Poor balance 02/21/2016  . Chronic lower back pain 02/21/2016  . Carotid stenosis-s/p CEA 2015 03/03/2014  . PVD (peripheral vascular disease) ([place]) 02/04/2014  . Change in bowel habits 05/22/2011  . URINARY INCONTINENCE 12/22/2010  . ABDOMINAL BRUIT 08/10/2010  . FASTING HYPERGLYCEMIA 11/15/2009  . Depression 08/01/2009  . GERD 08/01/2009  . IRRITABLE BOWEL SYNDROME 12/27/2008  . TINNITUS, CHRONIC 11/05/2008  . HEARING LOSS, HIGH FREQUENCY 11/05/2008  . HIATAL HERNIA 08/11/2008  . ORTHOSTATIC HYPOTENSION 06/08/2008  . Lung nodule 11/20/2007  . OBESITY, MILD 11/19/2007  .  Hyperlipidemia 10/09/2007  . Tobacco abuse, in remission 10/09/2007  . Essential hypertension 10/09/2007  . CAD S/P percutaneous coronary angioplasty 1985 10/09/2007  . COPD mixed type ([place]) 10/09/2007  . Dizziness 03/31/2007  . Headache 03/31/2007    Past Surgical History:  Procedure Laterality Date  . ANGIOPLASTY  1997, 98, 99  . BREAST SURGERY     Hx; of biopsy  . CARDIAC CATHETERIZATION N/A 08/10/2016   Procedure: Left Heart Cath and Coronary Angiography;  Surgeon: Belva Crome, MD;  Location: [facility];  Service: Cardiovascular;  Laterality: N/A;  . CAROTID ENDARTERECTOMY     left side x 3, saphenous vein graft from left leg  . CATARACT EXTRACTION W/ INTRAOCULAR LENS  IMPLANT, BILATERAL    . COLON SURGERY    . COLONOSCOPY  11-27-01, 10-30-05, 05-24-11   diverticulosis, hemorrhoids  . DENTAL SURGERY     2012   . ENDARTERECTOMY Right 03/03/2014   Procedure: RIGHT CAROTID ENDARTERECTOMY WITH PATCH ANGIOPLASTY;  Surgeon: Rosetta Posner, MD;  Location: [facility];  Service: Vascular;  Laterality: Right;  . FOOT SURGERY     bilateral  . SHOULDER SURGERY     left  . TOE SURGERY     right foot  . TONSILLECTOMY    . TUBAL LIGATION    . UPPER GASTROINTESTINAL ENDOSCOPY  11-27-01, 08-18-08   Hiatal hernia, benign esophagus neoplasia, gastritis, tortuous esophagus 74 Maloney dilation performed     OB History   None      Home Medications    Prior to Admission medications   Medication Sig Start Date End Date Taking? Authorizing Provider  acetaminophen (TYLENOL) 650 MG CR tablet Take 650 mg by mouth daily.   Yes [provider]  ALPRAZolam (XANAX) 0.25 MG tablet Take 1 tablet (0.25 mg total) by mouth 3 (three) times daily as needed for anxiety. 09/23/18   Binnie Rail, MD  aspirin EC 81 MG tablet Take 81 mg by mouth at bedtime.     [provider]  cholecalciferol (VITAMIN D) 1000 UNITS tablet Take 1,000 Units by mouth at bedtime.     [provider]  clopidogrel (PLAVIX) 75 MG tablet TAKE 1 TABLET BY MOUTH EVERY DAY 08/25/18   Binnie Rail, MD  ezetimibe (ZETIA) 10 MG tablet Take 1 tablet (10 mg total) by mouth daily. 08/07/18 11/05/18  Lelon Perla, MD  famotidine (PEPCID) 20 MG tablet Take 1 tablet (20 mg total) by mouth 2 (two) times daily. 09/18/18   Binnie Rail, MD  furosemide (LASIX) 40 MG tablet TAKE 1 TABLET BY MOUTH TWICE A DAY 01/20/18   Lelon Perla, MD  ipratropium-albuterol (DUONEB) 0.5-2.5 (3) MG/3ML SOLN  Take 3 mLs by nebulization every 4 (four) hours as needed. 09/24/18   Baird Lyons D, MD  isosorbide mononitrate (IMDUR) 30 MG 24 hr tablet Take 1 tablet (30 mg total) by mouth daily. 04/25/18 07/24/18  Lelon Perla, MD  lidocaine (LIDODERM) 5 % Place 1 patch onto the skin daily. Remove & Discard patch within 12 hours or as directed by MD 05/15/18   Hoyt Koch, MD  metoprolol succinate (TOPROL-XL) 25 MG 24 hr tablet TAKE 1/2 TABLET EVERY DAY 04/24/18   Lelon Perla, MD  montelukast (SINGULAIR) 10 MG tablet TAKE 1 TABLET BY MOUTH EVERYDAY AT BEDTIME 09/29/18   Burns, Claudina Lick, MD  nitroGLYCERIN (NITROSTAT) 0.4 MG SL tablet DISSOLVE 1 TABLET UNDER THE TONGUE EVERY 5 MINUTES FOR  3 DOSES AS NEEDED FOR CHEST PAIN. 09/22/18   Binnie Rail, MD  Omega-3 Fatty Acids (FISH OIL) 1000 MG CAPS Take 1,000 mg by mouth at bedtime.     [provider]  OXYGEN Inhale 3.5 L continuous into the lungs.    [provider]  potassium chloride (K-DUR) 10 MEQ tablet TAKE 1/2 TABLET ONCE DAILY 09/22/18   Lelon Perla, MD  predniSONE (DELTASONE) 20 MG tablet Take 2 tablets (40 mg total) by mouth daily with breakfast. 05/16/18   Hoyt Koch, MD  sertraline (ZOLOFT) 50 MG tablet TAKE 1 TABLET BY MOUTH EVERY DAY 08/06/18   Binnie Rail, MD  simvastatin (ZOCOR) 40 MG tablet TAKE 1 TABLET BY MOUTH EVERY DAY 07/08/18   Binnie Rail, MD  vitamin E 400 UNIT capsule Take 400 Units by mouth at bedtime.     [provider]    Family History Family History  Problem Relation Age of Onset  . Heart attack Father        deceased at 105, MGF  . Colon cancer Father   . Prostate cancer Father   . Heart disease Father   . Hyperlipidemia Father   . Heart disease Mother        deaceased at age 69  . Colitis Mother   . Stroke Mother   . Hyperlipidemia Mother   . Colitis Sister   . Hyperlipidemia Sister   . Other Brother        deceased age 37; war  . Hyperlipidemia Brother    . Breast cancer Maternal Aunt        great   . Lung cancer Paternal Aunt        great  . Vasculitis Son   . Hyperlipidemia Son   . Hypertension Son   . Heart attack Son   . Cancer Sister        unknown  . Hyperlipidemia Sister   . Hyperlipidemia Daughter   . Hypertension Daughter   . COPD Neg Hx   . Asthma Neg Hx     Social History Social History   Tobacco Use  . Smoking status: Former Smoker    Packs/day: 0.50    Years: 60.00    Pack years: 30.00    Types: Cigarettes    Last attempt to quit: 02/26/2014    Years since quitting: 4.6  . Smokeless tobacco: Never Used  Substance Use Topics  . Alcohol use: No  . Drug use: No     Allergies   Patient has no known allergies.   Review of Systems Review of Systems  Constitutional: Positive for chills, fatigue and fever. Negative for appetite change and diaphoresis.  HENT: Negative for sinus pressure, sinus pain, sneezing and sore throat.   Respiratory: Positive for cough and shortness of breath. Negative for chest tightness and wheezing.   Cardiovascular: Negative for chest pain and leg swelling.  Gastrointestinal: Positive for abdominal pain, diarrhea, nausea and vomiting. Negative for abdominal distention and constipation.  Genitourinary: Negative for difficulty urinating, dysuria, flank pain and urgency.  Musculoskeletal: Negative for back pain and neck pain.  Skin: Positive for pallor. Negative for rash and wound.  Neurological: Negative for dizziness and headaches.  Psychiatric/Behavioral: Negative for agitation and confusion. The patient is not nervous/anxious.   Ten systems reviewed and are negative for acute change, except as noted in the HPI.     Physical Exam Updated Vital Signs BP 117/67   Pulse 90  Temp 99.7 F (37.6 C) (Rectal)   Resp (!) 32   SpO2 (!) 38%   Physical Exam  Constitutional: She is oriented to person, place, and time. She has a sickly appearance. No distress.  Cardiovascular: Normal  heart sounds. Tachycardia present.  Pulmonary/Chest: Tachypnea noted. She has no decreased breath sounds. She has no wheezes. She has no rhonchi. She has rales in the left upper field and the left lower field.  Abdominal: Soft. Bowel sounds are normal. She exhibits no distension. There is tenderness in the left lower quadrant.  Neurological: She is alert and oriented to person, place, and time.  Skin: She is not diaphoretic.     ED Treatments / Results  Labs (all labs ordered are listed, but only abnormal results are displayed) Labs Reviewed  COMPREHENSIVE METABOLIC PANEL - Abnormal; Notable for the following components:      Result Value   Glucose, Bld 143 (*)    BUN 26 (*)    Creatinine, Ser 2.31 (*)    Calcium 8.8 (*)    AST 1,823 (*)    ALT 1,406 (*)    Total Bilirubin 1.3 (*)    GFR calc non Af Amer 18 (*)    GFR calc Af Amer 21 (*)    Anion gap 23 (*)    All other components within normal limits  CBC WITH DIFFERENTIAL/PLATELET - Abnormal; Notable for the following components:   WBC 14.8 (*)    RBC 3.79 (*)    Hemoglobin 11.6 (*)    Platelets 143 (*)    Neutro Abs 12.2 (*)    Monocytes Absolute 1.2 (*)    Abs Immature Granulocytes 0.14 (*)    All other components within normal limits  URINALYSIS, ROUTINE W REFLEX MICROSCOPIC - Abnormal; Notable for the following components:   Color, Urine AMBER (*)    APPearance HAZY (*)    Hgb urine dipstick SMALL (*)    Leukocytes, UA LARGE (*)    Bacteria, UA MANY (*)    All other components within normal limits  I-STAT CG4 LACTIC ACID, ED - Abnormal; Notable for the following components:   Lactic Acid, Venous 6.50 (*)    All other components within normal limits  I-STAT CG4 LACTIC ACID, ED - Abnormal; Notable for the following components:   Lactic Acid, Venous 8.50 (*)    All other components within normal limits  I-STAT ARTERIAL BLOOD GAS, ED - Abnormal; Notable for the following components:   pH, Arterial 7.348 (*)    pO2,  Arterial 57.0 (*)    Bicarbonate 19.0 (*)    TCO2 20 (*)    Acid-base deficit 6.0 (*)    All other components within normal limits  CULTURE, BLOOD (ROUTINE X 2)  CULTURE, BLOOD (ROUTINE X 2)  URINE CULTURE  RESPIRATORY PANEL BY PCR  LIPASE, BLOOD  HEPATITIS PANEL, ACUTE  INFLUENZA PANEL BY PCR (TYPE A & B)  I-STAT ARTERIAL BLOOD GAS, ED    EKG None  Radiology Ct Abdomen Pelvis Wo Contrast  Result Date: 10/02/2018 CLINICAL DATA:  Abdominal pain, fever, sepsis EXAM: CT ABDOMEN AND PELVIS WITHOUT CONTRAST TECHNIQUE: Multidetector CT imaging of the abdomen and pelvis was performed following the standard protocol without IV contrast. COMPARISON:  None. FINDINGS: Lower chest: Mild cardiomegaly. Dense coronary artery calcifications. Visualized aortic root mildly aneurysmal at 4.1 cm. This is stable since prior chest CT from 11/28/2016. Scarring in the lung bases. Hepatobiliary: No focal hepatic abnormality. Gallbladder unremarkable. Pancreas: No  focal abnormality or ductal dilatation. Spleen: No focal abnormality.  Normal size. Adrenals/Urinary Tract: No adrenal abnormality. No focal renal abnormality. No stones or hydronephrosis. Urinary bladder is unremarkable. Stomach/Bowel: Left colonic diverticulosis. No active diverticulitis. Vascular/Lymphatic: Aortic atherosclerosis. No enlarged abdominal or pelvic lymph nodes. Reproductive: Uterus and adnexa unremarkable.  No mass. Other: No free fluid or free air. Musculoskeletal: No acute bony abnormality. Degenerative changes in the lumbar spine. IMPRESSION: No acute findings in the abdomen or pelvis. Left colonic diverticulosis.  No active diverticulitis. Aortic atherosclerosis. Mild aneurysmal dilatation of the aortic root, stable since January 2018. Recommend annual imaging followup by CTA or MRA. This recommendation follows 2010 ACCF/AHA/AATS/ACR/ASA/SCA/SCAI/SIR/STS/SVM Guidelines for the Diagnosis and Management of Patients with Thoracic Aortic  Disease. Circulation. 2010; 121: C585-I778 Coronary artery disease. Electronically Signed   By: Rolm Baptise M.D.   On: 10/02/2018 21:28   Dg Chest Port 1 View  Result Date: 10/02/2018 CLINICAL DATA:  Shortness of breath. EXAM: PORTABLE CHEST 1 VIEW COMPARISON:  07/23/2018 FINDINGS: Chronic interstitial lung densities in both lungs, left side greater than right. No new airspace disease or consolidation. Heart size is stable. Atherosclerotic calcifications at the aortic arch. Trachea is midline. Negative for pneumothorax. IMPRESSION: Chronic lung disease is compatible with fibrosis. No acute findings. Electronically Signed   By: Markus Daft M.D.   On: 10/02/2018 19:09    Procedures Procedures (including critical care time)  Medications Ordered in ED Medications  metroNIDAZOLE (FLAGYL) IVPB 500 mg (has no administration in time range)  acetaminophen (TYLENOL) tablet 325 mg (has no administration in time range)  vancomycin (VANCOCIN) 500 mg in sodium chloride 0.9 % 100 mL IVPB (has no administration in time range)  ceFEPIme (MAXIPIME) 500 mg in dextrose 5 % 50 mL IVPB (has no administration in time range)  ipratropium-albuterol (DUONEB) 0.5-2.5 (3) MG/3ML nebulizer solution 3 mL (has no administration in time range)  sodium chloride 0.9 % bolus 500 mL (0 mLs Intravenous Stopped 10/02/18 1952)  acetaminophen (TYLENOL) tablet 650 mg (650 mg Oral Given 10/02/18 1939)  ceFEPIme (MAXIPIME) 2 g in sodium chloride 0.9 % 100 mL IVPB (0 g Intravenous Stopped 10/02/18 2110)  sodium chloride 0.9 % bolus 2,000 mL (0 mLs Intravenous Stopped 10/02/18 2113)  vancomycin (VANCOCIN) 1,250 mg in sodium chloride 0.9 % 250 mL IVPB (0 mg Intravenous Stopped 10/02/18 2216)  ondansetron (ZOFRAN) injection 4 mg (4 mg Intravenous Given 10/02/18 2113)  sodium chloride 0.9 % bolus 2,000 mL (2,000 mLs Intravenous New Bag/Given 10/02/18 2215)     Initial Impression / Assessment and Plan / ED Course  I have reviewed the triage  vital signs and the nursing notes.  Pertinent labs & imaging results that were available during my care of the patient were reviewed by me and considered in my medical decision making (see chart for details).  Clinical Course as of Oct 02 2325  Thu Oct 02, 2018  2016 Patient presenting with signs of sepsis but stable on presentation with blood pressures becoming soft after being roomed. A&o, leukocytosis, cough for several weeks, abdominal pain several days with onset of nausea and vomiting today. She has been unable to eat or drink much, LA 6.5. She has been having normal BM with last BM yesterday. TTP LLQ. CT abdomen ordered, broad spectrum antibiotics. 2L fluids. Broad spectrum abx started.    [JS]    Clinical Course User Index [JS] Marty Heck, DO    84 yo female presenting with abdominal pain, nausea, vomiting for  the past several days with abdomen TTP and septic, elevated LFTs. She was given 2L NS soft BP. She last had a BM yesterday with no signs of bleeding or acute changes. Broad spectrum abx started. CT abdomen pelvis showed no acute process, but LA continues to climb 6.5 >> 8.5. Blood pressure stable on 3L fluids. Discussed code status with her and her daughter and son earlier as she is quite ill, and she is DNR. O2 saturations difficult to obtain but ABG showed O2 57 with Outlook at 5L. As she is more agitated than earlier she is unlikely to tolerate bipap at this time. Will call admit to internal medicine.   Final Clinical Impressions(s) / ED Diagnoses   Final diagnoses:  SOB (shortness of breath)  Dehydration  Sepsis, due to unspecified organism, unspecified whether acute organ dysfunction present ([place])  Nausea and vomiting, intractability of vomiting not specified, unspecified vomiting type    ED Discharge Orders    None       Marty Heck, DO 10/02/18 2326    Jola Schmidt, MD 10/02/18 2340

## 2018-10-03 ENCOUNTER — Other Ambulatory Visit: Payer: Self-pay

## 2018-10-03 ENCOUNTER — Encounter (HOSPITAL_COMMUNITY): Payer: Self-pay

## 2018-10-03 DIAGNOSIS — R748 Abnormal levels of other serum enzymes: Secondary | ICD-10-CM | POA: Diagnosis present

## 2018-10-03 DIAGNOSIS — R6521 Severe sepsis with septic shock: Secondary | ICD-10-CM

## 2018-10-03 DIAGNOSIS — J9621 Acute and chronic respiratory failure with hypoxia: Secondary | ICD-10-CM

## 2018-10-03 DIAGNOSIS — N390 Urinary tract infection, site not specified: Secondary | ICD-10-CM | POA: Diagnosis present

## 2018-10-03 DIAGNOSIS — I5041 Acute combined systolic (congestive) and diastolic (congestive) heart failure: Secondary | ICD-10-CM | POA: Diagnosis present

## 2018-10-03 DIAGNOSIS — F411 Generalized anxiety disorder: Secondary | ICD-10-CM

## 2018-10-03 DIAGNOSIS — R9431 Abnormal electrocardiogram [ECG] [EKG]: Secondary | ICD-10-CM | POA: Diagnosis present

## 2018-10-03 DIAGNOSIS — N179 Acute kidney failure, unspecified: Secondary | ICD-10-CM

## 2018-10-03 DIAGNOSIS — A419 Sepsis, unspecified organism: Principal | ICD-10-CM

## 2018-10-03 DIAGNOSIS — N189 Chronic kidney disease, unspecified: Secondary | ICD-10-CM

## 2018-10-03 DIAGNOSIS — R6520 Severe sepsis without septic shock: Secondary | ICD-10-CM

## 2018-10-03 DIAGNOSIS — R7989 Other specified abnormal findings of blood chemistry: Secondary | ICD-10-CM

## 2018-10-03 DIAGNOSIS — I959 Hypotension, unspecified: Secondary | ICD-10-CM

## 2018-10-03 DIAGNOSIS — R112 Nausea with vomiting, unspecified: Secondary | ICD-10-CM

## 2018-10-03 LAB — RESPIRATORY PANEL BY PCR
Adenovirus: NOT DETECTED
Bordetella pertussis: NOT DETECTED
Chlamydophila pneumoniae: NOT DETECTED
Coronavirus 229E: NOT DETECTED
Coronavirus HKU1: NOT DETECTED
Coronavirus NL63: NOT DETECTED
Coronavirus OC43: NOT DETECTED
INFLUENZA A-RVPPCR: NOT DETECTED
Influenza B: NOT DETECTED
Metapneumovirus: NOT DETECTED
Mycoplasma pneumoniae: NOT DETECTED
PARAINFLUENZA VIRUS 4-RVPPCR: NOT DETECTED
Parainfluenza Virus 1: NOT DETECTED
Parainfluenza Virus 2: NOT DETECTED
Parainfluenza Virus 3: NOT DETECTED
Respiratory Syncytial Virus: NOT DETECTED
Rhinovirus / Enterovirus: NOT DETECTED

## 2018-10-03 LAB — CBC
HEMATOCRIT: 35.4 % — AB (ref 36.0–46.0)
Hemoglobin: 10.5 g/dL — ABNORMAL LOW (ref 12.0–15.0)
MCH: 31.1 pg (ref 26.0–34.0)
MCHC: 29.7 g/dL — ABNORMAL LOW (ref 30.0–36.0)
MCV: 104.7 fL — ABNORMAL HIGH (ref 80.0–100.0)
Platelets: 101 10*3/uL — ABNORMAL LOW (ref 150–400)
RBC: 3.38 MIL/uL — ABNORMAL LOW (ref 3.87–5.11)
RDW: 15.2 % (ref 11.5–15.5)
WBC: 20.6 10*3/uL — AB (ref 4.0–10.5)
nRBC: 0 % (ref 0.0–0.2)

## 2018-10-03 LAB — COMPREHENSIVE METABOLIC PANEL
ALBUMIN: 3.2 g/dL — AB (ref 3.5–5.0)
ALT: 1998 U/L — ABNORMAL HIGH (ref 0–44)
AST: 2984 U/L — AB (ref 15–41)
Alkaline Phosphatase: 64 U/L (ref 38–126)
Anion gap: 12 (ref 5–15)
BUN: 28 mg/dL — AB (ref 8–23)
CO2: 21 mmol/L — ABNORMAL LOW (ref 22–32)
Calcium: 7.3 mg/dL — ABNORMAL LOW (ref 8.9–10.3)
Chloride: 111 mmol/L (ref 98–111)
Creatinine, Ser: 1.77 mg/dL — ABNORMAL HIGH (ref 0.44–1.00)
GFR calc Af Amer: 29 mL/min — ABNORMAL LOW (ref >60)
GFR calc non Af Amer: 25 mL/min — ABNORMAL LOW (ref >60)
Glucose, Bld: 113 mg/dL — ABNORMAL HIGH (ref 70–99)
Potassium: 4 mmol/L (ref 3.5–5.1)
Sodium: 144 mmol/L (ref 135–145)
Total Bilirubin: 1.2 mg/dL (ref 0.3–1.2)
Total Protein: 5.8 g/dL — ABNORMAL LOW (ref 6.5–8.1)

## 2018-10-03 LAB — INFLUENZA PANEL BY PCR (TYPE A & B)
Influenza A By PCR: NEGATIVE
Influenza B By PCR: NEGATIVE

## 2018-10-03 LAB — TROPONIN I
Troponin I: 12.59 ng/mL (ref <0.03)
Troponin I: 12.79 ng/mL (ref <0.03)
Troponin I: 9.44 ng/mL (ref <0.03)

## 2018-10-03 LAB — LACTIC ACID, PLASMA
Lactic Acid, Venous: 6.3 mmol/L (ref 0.5–1.9)
Lactic Acid, Venous: 3.6 mmol/L (ref 0.5–1.9)

## 2018-10-03 LAB — PROCALCITONIN: Procalcitonin: 0.19 ng/mL

## 2018-10-03 LAB — CREATININE, URINE, RANDOM: Creatinine, Urine: 156.88 mg/dL

## 2018-10-03 LAB — SODIUM, URINE, RANDOM: Sodium, Ur: 47 mmol/L

## 2018-10-03 LAB — ACETAMINOPHEN LEVEL: Acetaminophen (Tylenol), Serum: <10 ug/mL — ABNORMAL LOW (ref 10–30)

## 2018-10-03 LAB — MRSA PCR SCREENING: MRSA by PCR: NEGATIVE

## 2018-10-03 LAB — CK: Total CK: 343 U/L — ABNORMAL HIGH (ref 38–234)

## 2018-10-03 MED ORDER — ACETAMINOPHEN 325 MG PO TABS: 650.0000 mg | ORAL_TABLET | Freq: Four times a day (QID) | ORAL | Status: DC | PRN

## 2018-10-03 MED ORDER — HEPARIN BOLUS VIA INFUSION
2000.0000 [IU] | Freq: Once | INTRAVENOUS | Status: DC
  Filled 2018-10-03: qty 2000

## 2018-10-03 MED ORDER — FAMOTIDINE 20 MG PO TABS: 20.0000 mg | ORAL_TABLET | Freq: Every day | ORAL | Status: DC

## 2018-10-03 MED ORDER — SODIUM CHLORIDE 0.9 % IV SOLN
1.0000 g | INTRAVENOUS | Status: DC
  Administered 2018-10-03 – 2018-10-05 (×3): 1 g via INTRAVENOUS
  Filled 2018-10-03 (×4): qty 10

## 2018-10-03 MED ORDER — IPRATROPIUM-ALBUTEROL 0.5-2.5 (3) MG/3ML IN SOLN
3.0000 mL | Freq: Three times a day (TID) | RESPIRATORY_TRACT | Status: DC
  Administered 2018-10-03 – 2018-10-11 (×23): 3 mL via RESPIRATORY_TRACT
  Filled 2018-10-03 (×23): qty 3

## 2018-10-03 MED ORDER — PREDNISONE 20 MG PO TABS
40.0000 mg | ORAL_TABLET | Freq: Every day | ORAL | Status: DC
  Administered 2018-10-03 – 2018-10-06 (×4): 40 mg via ORAL
  Filled 2018-10-03 (×4): qty 2

## 2018-10-03 MED ORDER — HEPARIN (PORCINE) 25000 UT/250ML-% IV SOLN: 750.0000 [IU]/h | INTRAVENOUS | Status: DC

## 2018-10-03 MED ORDER — CLOPIDOGREL BISULFATE 75 MG PO TABS
75.0000 mg | ORAL_TABLET | Freq: Every day | ORAL | Status: DC
  Administered 2018-10-03 – 2018-10-11 (×9): 75 mg via ORAL
  Filled 2018-10-03 (×9): qty 1

## 2018-10-03 MED ORDER — HEPARIN SODIUM (PORCINE) 5000 UNIT/ML IJ SOLN: 5000.0000 [IU] | Freq: Three times a day (TID) | INTRAMUSCULAR | Status: DC

## 2018-10-03 MED ORDER — FUROSEMIDE 10 MG/ML IJ SOLN
20.0000 mg | INTRAMUSCULAR | Status: AC
  Administered 2018-10-03: 20 mg via INTRAVENOUS
  Filled 2018-10-03: qty 2

## 2018-10-03 MED ORDER — HYDROCORTISONE NA SUCCINATE PF 100 MG IJ SOLR
100.0000 mg | Freq: Once | INTRAMUSCULAR | Status: AC
  Administered 2018-10-03: 100 mg via INTRAVENOUS
  Filled 2018-10-03: qty 2

## 2018-10-03 MED ORDER — ASPIRIN EC 81 MG PO TBEC
81.0000 mg | DELAYED_RELEASE_TABLET | Freq: Every day | ORAL | Status: DC
  Administered 2018-10-03 – 2018-10-11 (×9): 81 mg via ORAL
  Filled 2018-10-03 (×9): qty 1

## 2018-10-03 MED ORDER — ALBUTEROL SULFATE (2.5 MG/3ML) 0.083% IN NEBU: 2.5000 mg | INHALATION_SOLUTION | RESPIRATORY_TRACT | Status: DC | PRN

## 2018-10-03 MED ORDER — IPRATROPIUM-ALBUTEROL 0.5-2.5 (3) MG/3ML IN SOLN: 3.0000 mL | RESPIRATORY_TRACT | Status: DC | PRN

## 2018-10-03 MED ORDER — ONDANSETRON HCL 4 MG/2ML IJ SOLN: 4.0000 mg | Freq: Four times a day (QID) | INTRAMUSCULAR | Status: DC | PRN

## 2018-10-03 MED ORDER — MONTELUKAST SODIUM 10 MG PO TABS
10.0000 mg | ORAL_TABLET | Freq: Every day | ORAL | Status: DC
  Administered 2018-10-03 – 2018-10-10 (×9): 10 mg via ORAL
  Filled 2018-10-03 (×10): qty 1

## 2018-10-03 MED ORDER — ALPRAZOLAM 0.25 MG PO TABS
0.2500 mg | ORAL_TABLET | Freq: Three times a day (TID) | ORAL | Status: DC | PRN
  Administered 2018-10-03 – 2018-10-10 (×9): 0.25 mg via ORAL
  Filled 2018-10-03 (×9): qty 1

## 2018-10-03 MED ORDER — SERTRALINE HCL 50 MG PO TABS: 50.0000 mg | ORAL_TABLET | Freq: Every day | ORAL | Status: DC

## 2018-10-03 MED ORDER — PROCHLORPERAZINE EDISYLATE 10 MG/2ML IJ SOLN: 10.0000 mg | Freq: Four times a day (QID) | INTRAMUSCULAR | Status: DC | PRN

## 2018-10-03 MED ORDER — SODIUM CHLORIDE 0.9 % IV SOLN
INTRAVENOUS | Status: DC
  Administered 2018-10-03 – 2018-10-04 (×4): via INTRAVENOUS

## 2018-10-03 MED ORDER — IPRATROPIUM-ALBUTEROL 0.5-2.5 (3) MG/3ML IN SOLN
3.0000 mL | Freq: Four times a day (QID) | RESPIRATORY_TRACT | Status: DC
  Administered 2018-10-03 (×2): 3 mL via RESPIRATORY_TRACT
  Filled 2018-10-03 (×3): qty 3

## 2018-10-03 MED ORDER — ONDANSETRON HCL 4 MG PO TABS: 4.0000 mg | ORAL_TABLET | Freq: Four times a day (QID) | ORAL | Status: DC | PRN

## 2018-10-03 NOTE — ED Notes (Signed)
Dr. Smith

## 2018-10-03 NOTE — ED Notes (Signed)
RT called to put pt on high flow nasal canula?

## 2018-10-03 NOTE — Progress Notes (Signed)
[place] TEAM 1 - Stepdown/ICU TEAM  Linda Kelly  OJJ:[id number] DOB: 12-30-1927 DOA: 10/02/2018 PCP: Binnie Rail, MD    Brief Narrative:  84 y.o. female w/ a hx of pulmonary fibrosis on chronic home oxygen of 4-5 L, multivessel CAD followed by Dr.Brian Crenshaw, and combined systolic and diastolic heart failure last EF 40-45% who presented with a day of nausea and vomiting w/ subjective fever, chills, and malaise.   In the ED she was noted to be febrile to 101.4 F w/ O2 sats difficult to obtain on 5 L nasal cannula oxygen. Labs revealed WBC 14.8, creatinine 2.31, AST 1923, ALT 1406, and lactic acid 6.5, and repeat lactic acid 8.5.  Urinalysis showed large leukocytes and many bacteria.  Chest x-ray showed chronic pulmonary fibrosis without any other acute abnormalities.   Significant Events: 12/6 admit   Subjective: Pt is seen for a f/u visit.    Assessment & Plan:  Severe sepsis secondary to urinary tract infection  Hypotension  Elevated troponin  Prolonged QT interval initial QTc 534  Acute on chronic Hypoxic Respiratory failure - pulmonary fibrosis on chronic immunosuppressive therapy  Acute renal failure creatinine 1.07 in March, but presents with a creatinine of 2.31   Elevated liver enzymes  Chronic combined systolic and diastolic CHF EF 93-71% in 07/2016  CAD known multivessel disease including chronic total occlusion of mid RCA, mid LAD, and 99% occlusion of the mid distal circumflex by cath in 07/2016 - not amenable to intervention   Anemia  Anxiety Continue Xanax as needed  Hyperlipedema  Aneurysmal aortic root dilation followed in outpatient setting by cardiology   Thrombocytopenia  DVT prophylaxis: SCDs Code Status: FULL CODE Family Communication:  Disposition Plan:   Consultants:  Cardiology   Antimicrobials:     Objective: Blood pressure 130/64, pulse 76, temperature 97.9 F (36.6 C), temperature source Oral, resp.  rate (!) 22, SpO2 96 %.  Intake/Output Summary (Last 24 hours) at 10/03/2018 1755 Last data filed at 10/03/2018 1700 Gross per 24 hour  Intake 4197.11 ml  Output -  Net 4197.11 ml   There were no vitals filed for this visit.  Examination: Pt was seen for a f/u visit.    CBC: Recent Labs  Lab 10/02/18 1928 10/03/18 0748  WBC 14.8* 20.6*  NEUTROABS 12.2*  --   HGB 11.6* 10.5*  HCT 37.4 35.4*  MCV 98.7 104.7*  PLT 143* 696*   Basic Metabolic Panel: Recent Labs  Lab 10/02/18 1928 10/03/18 0748  NA 143 144  K 4.0 4.0  CL 98 111  CO2 22 21*  GLUCOSE 143* 113*  BUN 26* 28*  CREATININE 2.31* 1.77*  CALCIUM 8.8* 7.3*   GFR: Estimated Creatinine Clearance: 17.5 mL/min (A) (by C-G formula based on SCr of 1.77 mg/dL (H)).  Liver Function Tests: Recent Labs  Lab 10/02/18 1928 10/03/18 0748  AST 1,823* 2,984*  ALT 1,406* 1,998*  ALKPHOS 78 64  BILITOT 1.3* 1.2  PROT 7.4 5.8*  ALBUMIN 3.9 3.2*   Recent Labs  Lab 10/02/18 2138  LIPASE 28    Cardiac Enzymes: Recent Labs  Lab 10/03/18 0100 10/03/18 0748 10/03/18 1407  CKTOTAL 343*  --   --   TROPONINI 12.59* 12.79* 9.44*    HbA1C: Hgb A1c MFr Bld  Date/Time Value Ref Range Status  08/09/2016 01:40 AM 5.2 4.8 - 5.6 % Final    Comment:    (NOTE)         Pre-diabetes: 5.7 -  6.4         Diabetes: >6.4         Glycemic control for adults with diabetes: <7.0   02/28/2016 10:46 AM 5.4 4.6 - 6.5 % Final    Comment:    Glycemic Control Guidelines for People with Diabetes:Non Diabetic:  <6%Goal of Therapy: <7%Additional Action Suggested:  >8%     CBG: No results for input(s): GLUCAP in the last 168 hours.  Recent Results (from the past 240 hour(s))  Blood culture (routine x 2)     Status: None (Preliminary result)   Collection Time: 10/02/18  7:16 PM  Result Value Ref Range Status   Specimen Description BLOOD RIGHT ANTECUBITAL  Final   Special Requests   Final    BOTTLES DRAWN AEROBIC AND ANAEROBIC  Blood Culture adequate volume   Culture   Final    NO GROWTH < 24 HOURS Performed at [facility], [address] [place], [place] [address]    Report Status PENDING  Incomplete  Blood culture (routine x 2)     Status: None (Preliminary result)   Collection Time: 10/02/18  7:33 PM  Result Value Ref Range Status   Specimen Description BLOOD RIGHT HAND  Final   Special Requests   Final    BOTTLES DRAWN AEROBIC AND ANAEROBIC Blood Culture results may not be optimal due to an inadequate volume of blood received in culture bottles   Culture   Final    NO GROWTH < 24 HOURS Performed at [facility], [place] [address], [place], [place] [address]    Report Status PENDING  Incomplete  Respiratory Panel by PCR     Status: None   Collection Time: 10/02/18 11:12 PM  Result Value Ref Range Status   Adenovirus NOT DETECTED NOT DETECTED Final   Coronavirus 229E NOT DETECTED NOT DETECTED Final   Coronavirus HKU1 NOT DETECTED NOT DETECTED Final   Coronavirus NL63 NOT DETECTED NOT DETECTED Final   Coronavirus OC43 NOT DETECTED NOT DETECTED Final   Metapneumovirus NOT DETECTED NOT DETECTED Final   Rhinovirus / Enterovirus NOT DETECTED NOT DETECTED Final   Influenza A NOT DETECTED NOT DETECTED Final   Influenza B NOT DETECTED NOT DETECTED Final   Parainfluenza Virus 1 NOT DETECTED NOT DETECTED Final   Parainfluenza Virus 2 NOT DETECTED NOT DETECTED Final   Parainfluenza Virus 3 NOT DETECTED NOT DETECTED Final   Parainfluenza Virus 4 NOT DETECTED NOT DETECTED Final   Respiratory Syncytial Virus NOT DETECTED NOT DETECTED Final   Bordetella pertussis NOT DETECTED NOT DETECTED Final   Chlamydophila pneumoniae NOT DETECTED NOT DETECTED Final   Mycoplasma pneumoniae NOT DETECTED NOT DETECTED Final    Comment: Performed at [facility], [place] [address] [place], [place] [address]  MRSA PCR Screening     Status: None   Collection Time: 10/03/18  2:20 PM  Result Value Ref Range Status    MRSA by PCR NEGATIVE NEGATIVE Final    Comment:        The GeneXpert MRSA Assay (FDA approved for NASAL specimens only), is one component of a comprehensive MRSA colonization surveillance program. It is not intended to diagnose MRSA infection nor to guide or monitor treatment for MRSA infections. Performed at [facility], [place] [address], [place], [place] [address]      Scheduled Meds: . acetaminophen  325 mg Oral Once  . aspirin EC  81 mg Oral Daily  . ceFEPime (MAXIPIME)  IV  500 mg Intravenous Q24H  . clopidogrel  75 mg Oral Daily  . ipratropium-albuterol  3 mL Nebulization TID  . montelukast  10 mg Oral QHS  . predniSONE  40 mg Oral Q breakfast   Continuous Infusions: . sodium chloride 100 mL/hr at 10/03/18 1738  . [START ON 10/04/2018] vancomycin       LOS: 1 day   Time spent: No Charge  Cherene Altes, MD [facility] Office  [phone] Pager - Text Page per Amion as per below:  On-Call/Text Page:      [email or web address]  If 7PM-7AM, please contact night-coverage [email or web address] 10/03/2018, 5:55 PM

## 2018-10-03 NOTE — ED Notes (Signed)
Paged admitting dr Tamala Julian for rn angela

## 2018-10-03 NOTE — H&P (Addendum)
History and Physical    Linda Kelly WIO:[id number] DOB: [birth year] DOA: 10/02/2018  Referring MD/NP/PA: Barnet Glasgow, MD (resident) PCP: Binnie Rail, MD  Patient coming from: Home via EMS  Chief Complaint: Nausea and vomiting  I have personally briefly reviewed patient's old medical records in [place]   HPI: Linda Kelly is a 84 y.o. female with medical history significant of pulmonary fibrosis on chronic home oxygen of 4-5 L, multivessel CAD followed by Dr.Brian Crenshaw, combined systolic and diastolic heart failure last EF 40-45%; who presents with complaints of nausea and vomiting over the last day.  History is limited due to respiratory distress.  Patient notes associated symptoms of subjective fever, chills, and malaise.  She was unable to give any significant amount of fluid or liquids down due to her symptoms.  Emesis is reportedly nonbloody and she notes some lower abdominal discomfort. Denies having any chest pain, dysuria, history of hepatitis, use of large amounts of Tylenol.  At baseline patient has a chronic productive cough and she reports that her breathing has worsened some since being in the emergency department after fluids were started.   ED Course: On admission into the emergency department patient was noted to be febrile up to 101.4 F, pulse 89-1 01, respirations 9-37, blood pressures 94/71-140/92, and O2 sats difficult to obtain on 5 L nasal cannula oxygen.  Due to difficulty obtaining O2 saturations ABG was obtained and revealed pH of 7.348 , PO2 57, and PCO2 34.5.  Labs revealed WBC 14.8, hemoglobin 11.6, platelets 143, BNP 26, creatinine 2.31, lipase 28, AST 1923, ALT 1406, and lactic acid 6.5, and repeat lactic acid 8.5.  Urinalysis showed large leukocytes and many bacteria.  Chest x-ray showed chronic pulmonary fibrosis without any other acute abnormalities.  Sepsis protocol has been initiated and patient received a total of 4 L of IV fluids with empiric antibiotics  of vancomycin, cefepime, and metronidazole.   .Review of Systems  Unable to perform ROS: Severe respiratory distress  Constitutional: Positive for chills, fever and malaise/fatigue.  Respiratory: Positive for cough and shortness of breath.   Cardiovascular: Negative for chest pain.  Gastrointestinal: Positive for nausea and vomiting.  Genitourinary: Negative for dysuria.  Musculoskeletal: Negative for falls.  Neurological: Positive for weakness.  Psychiatric/Behavioral: The patient is nervous/anxious.     Past Medical History:  Diagnosis Date  . Anxiety   . Anxiety and depression   . Arthritis   . Benign neoplasm of esophagus   . CAD (coronary artery disease)   . Cancer [facility]    Skin cancer on back  . Carotid artery disease ([place])   . CHF (congestive heart failure) ([place])   . Chronic gastritis   . Colon, diverticulosis   . COPD (chronic obstructive pulmonary disease) ([place])   . Depression   . GERD (gastroesophageal reflux disease)   . Hiatal hernia   . High frequency hearing loss   . Hyperlipidemia   . IBS (irritable bowel syndrome)   . Idiopathic pulmonary fibrosis   . Internal hemorrhoid   . MI (myocardial infarction) ([place]) 1985  . Other nonthrombocytopenic purpuras   . PONV (postoperative nausea and vomiting)   . PVD (peripheral vascular disease) ([place])   . Shortness of breath    with exertion  . Solitary pulmonary nodule   . Stroke [facility])    Hx: of several mini -strokes  . Tinnitus    chronic  . Vertigo     Past Surgical History:  Procedure  Laterality Date  . ANGIOPLASTY  1997, 98, 99  . BREAST SURGERY     Hx; of biopsy  . CARDIAC CATHETERIZATION N/A 08/10/2016   Procedure: Left Heart Cath and Coronary Angiography;  Surgeon: Belva Crome, MD;  Location: [place] CV [facility];  Service: Cardiovascular;  Laterality: N/A;  . CAROTID ENDARTERECTOMY     left side x 3, saphenous vein graft from left leg  . CATARACT EXTRACTION W/ INTRAOCULAR LENS  IMPLANT, BILATERAL     . COLON SURGERY    . COLONOSCOPY  11-27-01, 10-30-05, 05-24-11   diverticulosis, hemorrhoids  . DENTAL SURGERY     2012   . ENDARTERECTOMY Right 03/03/2014   Procedure: RIGHT CAROTID ENDARTERECTOMY WITH PATCH ANGIOPLASTY;  Surgeon: Rosetta Posner, MD;  Location: [place];  Service: Vascular;  Laterality: Right;  . FOOT SURGERY     bilateral  . SHOULDER SURGERY     left  . TOE SURGERY     right foot  . TONSILLECTOMY    . TUBAL LIGATION    . UPPER GASTROINTESTINAL ENDOSCOPY  11-27-01, 08-18-08   Hiatal hernia, benign esophagus neoplasia, gastritis, tortuous esophagus 27 Maloney dilation performed     reports that she quit smoking about 4 years ago. Her smoking use included cigarettes. She has a 30.00 pack-year smoking history. She has never used smokeless tobacco. She reports that she does not drink alcohol or use drugs.  No Known Allergies  Family History  Problem Relation Age of Onset  . Heart attack Father        deceased at 30, MGF  . Colon cancer Father   . Prostate cancer Father   . Heart disease Father   . Hyperlipidemia Father   . Heart disease Mother        deaceased at age 6  . Colitis Mother   . Stroke Mother   . Hyperlipidemia Mother   . Colitis Sister   . Hyperlipidemia Sister   . Other Brother        deceased age 28; war  . Hyperlipidemia Brother   . Breast cancer Maternal Aunt        great   . Lung cancer Paternal Aunt        great  . Vasculitis Son   . Hyperlipidemia Son   . Hypertension Son   . Heart attack Son   . Cancer Sister        unknown  . Hyperlipidemia Sister   . Hyperlipidemia Daughter   . Hypertension Daughter   . COPD Neg Hx   . Asthma Neg Hx     Prior to Admission medications   Medication Sig Start Date End Date Taking? Authorizing Provider  acetaminophen (TYLENOL) 650 MG CR tablet Take 650 mg by mouth daily.   Yes [provider]  ALPRAZolam (XANAX) 0.25 MG tablet Take 1 tablet (0.25 mg total) by mouth 3 (three) times daily as  needed for anxiety. 09/23/18   Binnie Rail, MD  aspirin EC 81 MG tablet Take 81 mg by mouth at bedtime.     [provider]  cholecalciferol (VITAMIN D) 1000 UNITS tablet Take 1,000 Units by mouth at bedtime.     [provider]  clopidogrel (PLAVIX) 75 MG tablet TAKE 1 TABLET BY MOUTH EVERY DAY 08/25/18   Binnie Rail, MD  ezetimibe (ZETIA) 10 MG tablet Take 1 tablet (10 mg total) by mouth daily. 08/07/18 11/05/18  Lelon Perla, MD  famotidine (PEPCID) 20  MG tablet Take 1 tablet (20 mg total) by mouth 2 (two) times daily. 09/18/18   Binnie Rail, MD  furosemide (LASIX) 40 MG tablet TAKE 1 TABLET BY MOUTH TWICE A DAY 01/20/18   Lelon Perla, MD  ipratropium-albuterol (DUONEB) 0.5-2.5 (3) MG/3ML SOLN Take 3 mLs by nebulization every 4 (four) hours as needed. 09/24/18   Baird Lyons D, MD  isosorbide mononitrate (IMDUR) 30 MG 24 hr tablet Take 1 tablet (30 mg total) by mouth daily. 04/25/18 07/24/18  Lelon Perla, MD  lidocaine (LIDODERM) 5 % Place 1 patch onto the skin daily. Remove & Discard patch within 12 hours or as directed by MD 05/15/18   Hoyt Koch, MD  metoprolol succinate (TOPROL-XL) 25 MG 24 hr tablet TAKE 1/2 TABLET EVERY DAY 04/24/18   Lelon Perla, MD  montelukast (SINGULAIR) 10 MG tablet TAKE 1 TABLET BY MOUTH EVERYDAY AT BEDTIME 09/29/18   Burns, Claudina Lick, MD  nitroGLYCERIN (NITROSTAT) 0.4 MG SL tablet DISSOLVE 1 TABLET UNDER THE TONGUE EVERY 5 MINUTES FOR 3 DOSES AS NEEDED FOR CHEST PAIN. 09/22/18   Binnie Rail, MD  Omega-3 Fatty Acids (FISH OIL) 1000 MG CAPS Take 1,000 mg by mouth at bedtime.     [provider]  OXYGEN Inhale 3.5 L continuous into the lungs.    [provider]  potassium chloride (K-DUR) 10 MEQ tablet TAKE 1/2 TABLET ONCE DAILY 09/22/18   Lelon Perla, MD  predniSONE (DELTASONE) 20 MG tablet Take 2 tablets (40 mg total) by mouth daily with breakfast. 05/16/18   Hoyt Koch, MD    sertraline (ZOLOFT) 50 MG tablet TAKE 1 TABLET BY MOUTH EVERY DAY 08/06/18   Binnie Rail, MD  simvastatin (ZOCOR) 40 MG tablet TAKE 1 TABLET BY MOUTH EVERY DAY 07/08/18   Binnie Rail, MD  vitamin E 400 UNIT capsule Take 400 Units by mouth at bedtime.     [provider]    Physical Exam:  Constitutional: Elderly lady who appears ill in moderate respiratory distress. Vitals:   10/02/18 2237 10/02/18 2238 10/02/18 2300 10/02/18 2340  BP: 117/67  (!) 118/38   Pulse: 93 90 93   Resp: (!) 28 (!) 32 (!) 30 (!) 25  Temp:      TempSrc:      SpO2:   (!) 66%    Eyes: PERRL, lids and conjunctivae normal ENMT: Mucous membranes are dry posterior pharynx clear of any exudate or lesions. Neck: normal, supple, no masses, no thyromegaly. Respiratory: Tachypneic with coarse crackles appreciated.  Patient on 5 L nasal cannula oxygen at this time and only able to talk in 1-2 word sentences. Cardiovascular: Regular rate and rhythm, no murmurs / rubs / gallops. No extremity edema. 2+ pedal pulses. No carotid bruits.  Abdomen: Some mild suprapubic tenderness appreciated.  Bowel sounds within normal limits. Musculoskeletal: no clubbing / cyanosis. No joint deformity upper and lower extremities. Good ROM, no contractures. Normal muscle tone.  Skin: Patient has a dusky-like appearance. Neurologic: CN 2-12 grossly intact. Sensation intact, DTR normal. Strength 5/5 in all 4.  Psychiatric: Normal judgment and insight. Alert and oriented x 3.  Anxious mood.     Labs on Admission: I have personally reviewed following labs and imaging studies  CBC: Recent Labs  Lab 10/02/18 1928  WBC 14.8*  NEUTROABS 12.2*  HGB 11.6*  HCT 37.4  MCV 98.7  PLT 902*   Basic Metabolic Panel: Recent Labs  Lab  10/02/18 1928  NA 143  K 4.0  CL 98  CO2 22  GLUCOSE 143*  BUN 26*  CREATININE 2.31*  CALCIUM 8.8*   GFR: Estimated Creatinine Clearance: 13.4 mL/min (A) (by C-G formula based on SCr of 2.31  mg/dL (H)). Liver Function Tests: Recent Labs  Lab 10/02/18 1928  AST 1,823*  ALT 1,406*  ALKPHOS 78  BILITOT 1.3*  PROT 7.4  ALBUMIN 3.9   Recent Labs  Lab 10/02/18 2138  LIPASE 28   No results for input(s): AMMONIA in the last 168 hours. Coagulation Profile: No results for input(s): INR, PROTIME in the last 168 hours. Cardiac Enzymes: No results for input(s): CKTOTAL, CKMB, CKMBINDEX, TROPONINI in the last 168 hours. BNP (last 3 results) Recent Labs    07/23/18 1111  PROBNP 162.0*   HbA1C: No results for input(s): HGBA1C in the last 72 hours. CBG: No results for input(s): GLUCAP in the last 168 hours. Lipid Profile: No results for input(s): CHOL, HDL, LDLCALC, TRIG, CHOLHDL, LDLDIRECT in the last 72 hours. Thyroid Function Tests: No results for input(s): TSH, T4TOTAL, FREET4, T3FREE, THYROIDAB in the last 72 hours. Anemia Panel: No results for input(s): VITAMINB12, FOLATE, FERRITIN, TIBC, IRON, RETICCTPCT in the last 72 hours. Urine analysis:    Component Value Date/Time   COLORURINE AMBER (A) 10/02/2018 1959   APPEARANCEUR HAZY (A) 10/02/2018 1959   LABSPEC 1.014 10/02/2018 1959   PHURINE 5.0 10/02/2018 1959   GLUCOSEU NEGATIVE 10/02/2018 1959   HGBUR SMALL (A) 10/02/2018 [place] NEGATIVE 10/02/2018 1959   BILIRUBINUR neg 09/06/2011 1502   [place] 10/02/2018 1959   PROTEINUR NEGATIVE 10/02/2018 1959   UROBILINOGEN 0.2 02/26/2014 1306   NITRITE NEGATIVE 10/02/2018 1959   LEUKOCYTESUR LARGE (A) 10/02/2018 1959   Sepsis Labs: No results found for this or any previous visit (from the past 240 hour(s)).   Radiological Exams on Admission: Ct Abdomen Pelvis Wo Contrast  Result Date: 10/02/2018 CLINICAL DATA:  Abdominal pain, fever, sepsis EXAM: CT ABDOMEN AND PELVIS WITHOUT CONTRAST TECHNIQUE: Multidetector CT imaging of the abdomen and pelvis was performed following the standard protocol without IV contrast. COMPARISON:  None. FINDINGS:  Lower chest: Mild cardiomegaly. Dense coronary artery calcifications. Visualized aortic root mildly aneurysmal at 4.1 cm. This is stable since prior chest CT from 11/28/2016. Scarring in the lung bases. Hepatobiliary: No focal hepatic abnormality. Gallbladder unremarkable. Pancreas: No focal abnormality or ductal dilatation. Spleen: No focal abnormality.  Normal size. Adrenals/Urinary Tract: No adrenal abnormality. No focal renal abnormality. No stones or hydronephrosis. Urinary bladder is unremarkable. Stomach/Bowel: Left colonic diverticulosis. No active diverticulitis. Vascular/Lymphatic: Aortic atherosclerosis. No enlarged abdominal or pelvic lymph nodes. Reproductive: Uterus and adnexa unremarkable.  No mass. Other: No free fluid or free air. Musculoskeletal: No acute bony abnormality. Degenerative changes in the lumbar spine. IMPRESSION: No acute findings in the abdomen or pelvis. Left colonic diverticulosis.  No active diverticulitis. Aortic atherosclerosis. Mild aneurysmal dilatation of the aortic root, stable since January 2018. Recommend annual imaging followup by CTA or MRA. This recommendation follows 2010 ACCF/AHA/AATS/ACR/ASA/SCA/SCAI/SIR/STS/SVM Guidelines for the Diagnosis and Management of Patients with Thoracic Aortic Disease. Circulation. 2010; 121: U272-Z366 Coronary artery disease. Electronically Signed   By: Rolm Baptise M.D.   On: 10/02/2018 21:28   Dg Chest Port 1 View  Result Date: 10/02/2018 CLINICAL DATA:  Shortness of breath. EXAM: PORTABLE CHEST 1 VIEW COMPARISON:  07/23/2018 FINDINGS: Chronic interstitial lung densities in both lungs, left side greater than right. No new airspace  disease or consolidation. Heart size is stable. Atherosclerotic calcifications at the aortic arch. Trachea is midline. Negative for pneumothorax. IMPRESSION: Chronic lung disease is compatible with fibrosis. No acute findings. Electronically Signed   By: Markus Daft M.D.   On: 10/02/2018 19:09    Chest  X-ray: Independently reviewed.  Interstitial lung densities, but no signs of infiltrate or edema  Assessment/Plan Severe sepsis secondary to urinary tract infection: Patient presents with complaints of nausea and vomiting with some abdominal discomfort.  Found to be febrile up to 101.83F with tachypnea and some hypotension.  Initial lab work revealed WBC 14.8 with lactic acid of 6.5 trending up to 8.5.  Urinalysis showed positive signs for infection.  Chest x-ray showed chronic interstitial lung disease consistent with fibrosis patient received a total of 4L of normal saline IV fluids.  Unclear if some other process present. - Admit to stepdown bed - Follow-up sputum, blood, urine cultures - Check influenza and respiratory virus panel - Trend lactic acid level - Continue empiric antibiotics of vancomycin and cefepime and de-escalate when medically appropriate  Hypotension: Blood pressures noted to drop into the 70s on the emergency room.  Patient was given IV fluids with some improvement [place].  Suspect symptoms likely related with dehydration given rise in creatinine and reports no nausea or vomiting.  Possibility of some adrenal insufficiency given chronic steroid use. -100 mg of hydrocortisone x1 dose - IV fluids as tolerated  Addendum: Elevated troponin: Acute. Initial troponin elevated at 12.58.  Patient reports feeling fatigue. - EKG ordered - Cardiology consulted [place] - Will follow-up need to determine if heparin drip needed  Prolonged QT interval: Acute. Initial QT C5 34. - Discontinued previously ordered QT prolonging medications   Nausea and vomiting: Patient presents with complaints of nausea vomiting.  Symptoms could been secondary to urinary tract infection versus gastroenteritis. - Antiemetics as needed  Respiratory failure with hypoxia, pulmonary fibrosis on chronic immunosuppressive therapy: Acute on chronic.  Patient normally on approximately 4 L of oxygen at  baseline currently on 40 mg of prednisone daily.  Initial ABG revealed a pH of 7.348 with PCO2 34, and PaO2 57.  He appeared to have developed worsening respiratory distress after being given IV fluids question possibility some aspect of fluid overload. - Continuous pulse oximetry with nasal cannula oxygen - Continue Singulair and prednisone - DuoNeb's every 4 hours and as needed overnight  Acute renal failure: Patient previously noted to have a creatinine of 1.07 back in March, but presents with a creatinine of 2.31 with BUN 26.  Patient was initially given 4 L of saline IV fluids.  No signs of obstruction noted on CT of the abdomen.  Patient noted to be on furosemide - Check urine studies - Recheck kidney function in a.m. - Consider restarting IV fluids if patient not showing signs of fluid overload  Elevated liver enzymes: Acute.  Patient presents with acute elevation in liver enzymes AST 1823, ALT 1406, and total bilirubin 1.3.  Patient denies history of hepatitis. - Check acetaminophen level, hepatitis panel, and CK  - Recheck CMP in a.m.  Chronic combined systolic and diastolic CHF: Clinically patient appears to be dry.  Last EF noted to be 40 to 45% in 07/2016.  - Strict I&Os  - Daily weights - Currently holding oral furosemide due to acute kidney injury - Will give low-dose IV furosemide if needed for signs of fluid overload  CAD: Patient has known multivessel disease including chronic total occlusion of mid  RCA, mid LAD, and 99% occlusion of the mid distal circumflex by cath in 07/2016, PCI in 1985. - Continue Plavix  Anemia: Hemoglobin noted to be 11.6 on admission which appears to be near patient's baseline. - Continue to monitor  Anxiety: Chronic. - Continue Xanax as needed  Hyperlipedema - Hold simvastatin due to elevated liver function studies  Aneurysmal aortic root dilation:Stable.  Patient followed in outpatient setting by cardiology for this.  Thrombocytopenia:  Initial platelet count 143 on admission.  DVT prophylaxis: Heparin Code Status: DNR Family Communication: None Disposition Plan: To be determined Consults called: None Admission status: [place]  Norval Morton MD [facility] Pager [phone]   If 7PM-7AM, please contact night-coverage [email or web address] Password [email or web address]  10/03/2018, 12:08 AM

## 2018-10-03 NOTE — Progress Notes (Signed)
ANTICOAGULATION CONSULT NOTE - Initial Consult  Pharmacy Consult for Heparin  Indication: Elevated troponin  No Known Allergies  Patient Measurements: ~62 kg  Vital Signs: Temp: 99.7 F (37.6 C) (12/05 2229) Temp Source: Rectal (12/05 2229) BP: 96/75 (12/06 0215) Pulse Rate: 87 (12/06 0215)  Labs: Recent Labs    10/02/18 1928 10/03/18 0100  HGB 11.6*  --   HCT 37.4  --   PLT 143*  --   CREATININE 2.31*  --   CKTOTAL  --  343*  TROPONINI  --  12.59*    Estimated Creatinine Clearance: 13.4 mL/min (A) (by C-G formula based on SCr of 2.31 mg/dL (H)).   Medical History: Past Medical History:  Diagnosis Date  . Anxiety   . Anxiety and depression   . Arthritis   . Benign neoplasm of esophagus   . CAD (coronary artery disease)   . Cancer [facility])    Skin cancer on back  . Carotid artery disease ([place])   . CHF (congestive heart failure) ([place])   . Chronic gastritis   . Colon, diverticulosis   . COPD (chronic obstructive pulmonary disease) ([place])   . Depression   . GERD (gastroesophageal reflux disease)   . Hiatal hernia   . High frequency hearing loss   . Hyperlipidemia   . IBS (irritable bowel syndrome)   . Idiopathic pulmonary fibrosis   . Internal hemorrhoid   . MI (myocardial infarction) ([place]) 1985  . Other nonthrombocytopenic purpuras   . PONV (postoperative nausea and vomiting)   . PVD (peripheral vascular disease) ([place])   . Shortness of breath    with exertion  . Solitary pulmonary nodule   . Stroke [facility])    Hx: of several mini -strokes  . Tinnitus    chronic  . Vertigo     Assessment: 84 y/o F here with N/V x 24 hours, found to have elevated troponin, starting heparin, PTA meds reviewed, Hgb 11.6, plts 143, noted renal dysfunction.   Goal of Therapy:  Heparin level 0.3-0.7 units/ml Monitor platelets by anticoagulation protocol: Yes   Plan:  Heparin 2000 units BOLUS Start heparin drip at 750 units/hr 1100 HL Daily CBC/HL Monitor for  bleeding   Narda Bonds 10/03/2018,2:22 AM

## 2018-10-03 NOTE — ED Notes (Addendum)
Admitting Dr. And Cardiology made aware of pt's troponin level and pressures

## 2018-10-03 NOTE — Consult Note (Addendum)
Cardiology Consultation:   Patient ID: Linda Kelly; [id number]; September 05, 1928   Admit date: 10/02/2018 Date of Consult: 10/03/2018  Primary Care Provider: Binnie Rail, MD Primary Cardiologist: Stanford Breed Primary Electrophysiologist:  None  Chief Complaint: Nausea and abdominal pain  Patient Profile:   Linda Kelly is a 84 y.o. female with severe un-revascularizable multivessel coronary artery disease (chronic total occlusion of the mid-LAD, chronic total occlusion of the mid RCA, 99% stenosis of the distal LCx), ischemic cardiomyopathy with left ventricular ejection fraction of 40%, peripheral vascular disease including abdominal aortic aneurysm, right iliac, left subclavian and carotid stenosis status-post right carotid endarterectomy, pulmonary fibrosis on home oxygen who is being seen today for the evaluation of elevated troponin at the request of Dr. Tamala Julian.  History of Present Illness:   Linda Kelly presented to the emergency department this evening with severe pain in the abdominal region, vomiting, elevated liver function tests (AST 1823, ALT 1406, total bilirubin 1.3). Lactate 6.5 up-trending to 8.5. She was febrile to 102 degrees. This has been accompanied by subjective chills. Her initial blood pressure was 140/92 but this subsequently decreased to 74/56. She was resuscitated with 4.5 liters of intravenous fluid and received broad spectrum antibiotic therapy. Following fluid resuscitation her lactate has down-trended to 6.3. Pro-calcitonin is 0.19. Troponin elevated at 12.59 and cardiology consultation obtained for management recommendations. She reports her symptoms are different from prior myocardial infarction due to the absence of arm discomfort. She would like supportive care but prioritizes comfort and would not want chest compressions intubation or invasive procedures.    Past Medical History:  Diagnosis Date  . Anxiety   . Anxiety and depression   . Arthritis   . Benign  neoplasm of esophagus   . CAD (coronary artery disease)   . Cancer [facility])    Skin cancer on back  . Carotid artery disease ([place])   . CHF (congestive heart failure) ([place])   . Chronic gastritis   . Colon, diverticulosis   . COPD (chronic obstructive pulmonary disease) ([place])   . Depression   . GERD (gastroesophageal reflux disease)   . Hiatal hernia   . High frequency hearing loss   . Hyperlipidemia   . IBS (irritable bowel syndrome)   . Idiopathic pulmonary fibrosis   . Internal hemorrhoid   . MI (myocardial infarction) ([place]) 1985  . Other nonthrombocytopenic purpuras   . PONV (postoperative nausea and vomiting)   . PVD (peripheral vascular disease) ([place])   . Shortness of breath    with exertion  . Solitary pulmonary nodule   . Stroke [facility])    Hx: of several mini -strokes  . Tinnitus    chronic  . Vertigo     Past Surgical History:  Procedure Laterality Date  . ANGIOPLASTY  1997, 98, 99  . BREAST SURGERY     Hx; of biopsy  . CARDIAC CATHETERIZATION N/A 08/10/2016   Procedure: Left Heart Cath and Coronary Angiography;  Surgeon: Belva Crome, MD;  Location: [facility];  Service: Cardiovascular;  Laterality: N/A;  . CAROTID ENDARTERECTOMY     left side x 3, saphenous vein graft from left leg  . CATARACT EXTRACTION W/ INTRAOCULAR LENS  IMPLANT, BILATERAL    . COLON SURGERY    . COLONOSCOPY  11-27-01, 10-30-05, 05-24-11   diverticulosis, hemorrhoids  . DENTAL SURGERY     2012   . ENDARTERECTOMY Right 03/03/2014   Procedure: RIGHT CAROTID ENDARTERECTOMY WITH PATCH ANGIOPLASTY;  Surgeon:  Rosetta Posner, MD;  Location: [facility];  Service: Vascular;  Laterality: Right;  . FOOT SURGERY     bilateral  . SHOULDER SURGERY     left  . TOE SURGERY     right foot  . TONSILLECTOMY    . TUBAL LIGATION    . UPPER GASTROINTESTINAL ENDOSCOPY  11-27-01, 08-18-08   Hiatal hernia, benign esophagus neoplasia, gastritis, tortuous esophagus 37 Maloney dilation performed     Inpatient  Medications: Scheduled Meds: . acetaminophen  325 mg Oral Once  . ceFEPime (MAXIPIME) IV  500 mg Intravenous Q24H  . clopidogrel  75 mg Oral Daily  . famotidine  20 mg Oral Daily  . heparin  2,000 Units Intravenous Once  . ipratropium-albuterol  3 mL Nebulization QID  . montelukast  10 mg Oral QHS  . predniSONE  40 mg Oral Q breakfast  . sertraline  50 mg Oral Daily   Continuous Infusions: . heparin    . metronidazole Stopped (10/03/18 0147)  . [START ON 10/04/2018] vancomycin     PRN Meds: ALPRAZolam, ipratropium-albuterol, ondansetron **OR** ondansetron (ZOFRAN) IV  Home Meds: Prior to Admission medications   Medication Sig Start Date End Date Taking? Authorizing Provider  acetaminophen (TYLENOL) 650 MG CR tablet Take 650 mg by mouth daily.   Yes [provider]  ALPRAZolam (XANAX) 0.25 MG tablet Take 1 tablet (0.25 mg total) by mouth 3 (three) times daily as needed for anxiety. 09/23/18   Binnie Rail, MD  aspirin EC 81 MG tablet Take 81 mg by mouth at bedtime.     [provider]  cholecalciferol (VITAMIN D) 1000 UNITS tablet Take 1,000 Units by mouth at bedtime.     [provider]  clopidogrel (PLAVIX) 75 MG tablet TAKE 1 TABLET BY MOUTH EVERY DAY 08/25/18   Binnie Rail, MD  ezetimibe (ZETIA) 10 MG tablet Take 1 tablet (10 mg total) by mouth daily. 08/07/18 11/05/18  Lelon Perla, MD  famotidine (PEPCID) 20 MG tablet Take 1 tablet (20 mg total) by mouth 2 (two) times daily. 09/18/18   Binnie Rail, MD  furosemide (LASIX) 40 MG tablet TAKE 1 TABLET BY MOUTH TWICE A DAY 01/20/18   Lelon Perla, MD  ipratropium-albuterol (DUONEB) 0.5-2.5 (3) MG/3ML SOLN Take 3 mLs by nebulization every 4 (four) hours as needed. 09/24/18   Baird Lyons D, MD  isosorbide mononitrate (IMDUR) 30 MG 24 hr tablet Take 1 tablet (30 mg total) by mouth daily. 04/25/18 07/24/18  Lelon Perla, MD  lidocaine (LIDODERM) 5 % Place 1 patch onto the skin daily. Remove  & Discard patch within 12 hours or as directed by MD 05/15/18   Hoyt Koch, MD  metoprolol succinate (TOPROL-XL) 25 MG 24 hr tablet TAKE 1/2 TABLET EVERY DAY 04/24/18   Lelon Perla, MD  montelukast (SINGULAIR) 10 MG tablet TAKE 1 TABLET BY MOUTH EVERYDAY AT BEDTIME 09/29/18   Burns, Claudina Lick, MD  nitroGLYCERIN (NITROSTAT) 0.4 MG SL tablet DISSOLVE 1 TABLET UNDER THE TONGUE EVERY 5 MINUTES FOR 3 DOSES AS NEEDED FOR CHEST PAIN. 09/22/18   Binnie Rail, MD  Omega-3 Fatty Acids (FISH OIL) 1000 MG CAPS Take 1,000 mg by mouth at bedtime.     [provider]  OXYGEN Inhale 3.5 L continuous into the lungs.    [provider]  potassium chloride (K-DUR) 10 MEQ tablet TAKE 1/2 TABLET ONCE DAILY 09/22/18   Lelon Perla, MD  predniSONE (  DELTASONE) 20 MG tablet Take 2 tablets (40 mg total) by mouth daily with breakfast. 05/16/18   Hoyt Koch, MD  sertraline (ZOLOFT) 50 MG tablet TAKE 1 TABLET BY MOUTH EVERY DAY 08/06/18   Binnie Rail, MD  simvastatin (ZOCOR) 40 MG tablet TAKE 1 TABLET BY MOUTH EVERY DAY 07/08/18   Binnie Rail, MD  vitamin E 400 UNIT capsule Take 400 Units by mouth at bedtime.     [provider]    Allergies:   No Known Allergies  Social History:   Social History   Socioeconomic History  . Marital status: Widowed    Spouse name: Not on file  . Number of children: 6  . Years of education: Not on file  . Highest education level: Not on file  Occupational History  . Occupation: Armed forces operational officer at [employer]: [place]  . Financial resource strain: Not hard at all  . Food insecurity:    Worry: Never true    Inability: Never true  . Transportation needs:    Medical: No    Non-medical: No  Tobacco Use  . Smoking status: Former Smoker    Packs/day: 0.50    Years: 60.00    Pack years: 30.00    Types: Cigarettes    Last attempt to quit: 02/26/2014    Years since quitting: 4.6  . Smokeless  tobacco: Never Used  Substance and Sexual Activity  . Alcohol use: No  . Drug use: No  . Sexual activity: Never  Lifestyle  . Physical activity:    Days per week: 0 days    Minutes per session: 0 min  . Stress: Only a little  Relationships  . Social connections:    Talks on phone: More than three times a week    Gets together: More than three times a week    Attends religious service: 1 to 4 times per year    Active member of club or organization: Not on file    Attends meetings of clubs or organizations: 1 to 4 times per year    Relationship status: Widowed  . Intimate partner violence:    Fear of current or ex partner: Not on file    Emotionally abused: Not on file    Physically abused: Not on file    Forced sexual activity: Not on file  Other Topics Concern  . Not on file  Social History Narrative   [place] to [place] twice a year to visit sister- 3-4 weeks travel there every year.   Pt does not get regular exercise    Family History:   The patient's family history includes Breast cancer in her maternal aunt; Cancer in her sister; Colitis in her mother and sister; Colon cancer in her father; Heart attack in her father and son; Heart disease in her father and mother; Hyperlipidemia in her brother, daughter, father, mother, sister, sister, and son; Hypertension in her daughter and son; Lung cancer in her paternal aunt; Other in her brother; Prostate cancer in her father; Stroke in her mother; Vasculitis in her son. There is no history of COPD or Asthma.  ROS:  Please see the history of present illness.   Physical Exam/Data:   Vitals:   10/03/18 0053 10/03/18 0145 10/03/18 0200 10/03/18 0215  BP:  (!) 74/56 (!) 79/43 96/75  Pulse:  (!) 128 88 87  Resp:  (!) 22 Marland Kitchen)  30 (!) 29  Temp:      TempSrc:      SpO2: 91%  100% 100%    Intake/Output Summary (Last 24 hours) at 10/03/2018 0225 Last data filed at 10/02/2018 2246 Gross per 24 hour   Intake 2820.44 ml  Output -  Net 2820.44 ml   There were no vitals filed for this visit. There is no height or weight on file to calculate BMI.  General: Ill appearing elderly female  Head: Normocephalic, atraumatic, sclera non-icteric, no xanthomas, nares are without discharge.  Neck: Negative for carotid bruits. JVD not elevated. Lungs: Clear bilaterally to auscultation without wheezes, rales, or rhonchi. Breathing is labored. Tachypneic. Unable to speak in full sentences.  Heart: Irregular. No murmurs, rubs, or gallops appreciated. Abdomen: Soft, non-tender, non-distended with normoactive bowel sounds. No hepatomegaly. No rebound/guarding. No obvious abdominal masses. Msk:  Strength and tone appear normal for age. Extremities: No clubbing or cyanosis. No edema.  Distal pedal pulses are 2+ and equal bilaterally. Neuro: Alert and oriented X 3. No facial asymmetry. No focal deficit. Moves all extremities spontaneously. Psych:  Responds to questions appropriately with a normal affect.  EKG:  The EKG was personally reviewed and demonstrates   Relevant CV Studies:  08/10/2016 [facility] personally reviewed  Complicated procedure that demonstrated high-grade obstruction in the right iliac artery preventing the procedure from being completed and requiring crossover to the left femoral artery.  Inability to advance the pulmonary artery catheter into the pulmonary artery or capillary wedge position. RV systolic pressure is mildly to moderately elevated.  Severe multivessel coronary disease with chronic total occlusion of the mid RCA collateralized by right to right and left-to-right sources.  Total occlusion of the mid LAD beyond a large first diagonal. Left left collaterals are noted.  99% stenosis in the mid to distal native circumflex beyond a very tortuous segment. PCI was not attempted. Distal territory is relatively small.  Inferior wall severe hypokinesis involving the basal to mid  segment. Anterior and apical mild hypokinesis is noted. Ejection fraction is estimated to be 35-45%. Presentation was that of acute on chronic combined systolic and diastolic heart failure.  08/10/2016 TTE personally reviewed LVEF=40-45% Moderate MAC mild-moderate MR Rv normal Trivial TR  Laboratory Data:  Chemistry Recent Labs  Lab 10/02/18 1928  NA 143  K 4.0  CL 98  CO2 22  GLUCOSE 143*  BUN 26*  CREATININE 2.31*  CALCIUM 8.8*  GFRNONAA 18*  GFRAA 21*  ANIONGAP 23*    Recent Labs  Lab 10/02/18 1928  PROT 7.4  ALBUMIN 3.9  AST 1,823*  ALT 1,406*  ALKPHOS 78  BILITOT 1.3*   Hematology Recent Labs  Lab 10/02/18 1928  WBC 14.8*  RBC 3.79*  HGB 11.6*  HCT 37.4  MCV 98.7  MCH 30.6  MCHC 31.0  RDW 15.0  PLT 143*   Cardiac Enzymes Recent Labs  Lab 10/03/18 0100  TROPONINI 12.59*   No results for input(s): TROPIPOC in the last 168 hours.  BNPNo results for input(s): BNP, PROBNP in the last 168 hours.  DDimer No results for input(s): DDIMER in the last 168 hours.  Radiology/Studies:  Ct Abdomen Pelvis Wo Contrast  Result Date: 10/02/2018 CLINICAL DATA:  Abdominal pain, fever, sepsis EXAM: CT ABDOMEN AND PELVIS WITHOUT CONTRAST TECHNIQUE: Multidetector CT imaging of the abdomen and pelvis was performed following the standard protocol without IV contrast. COMPARISON:  None. FINDINGS: Lower chest: Mild cardiomegaly. Dense coronary artery calcifications. Visualized aortic root mildly aneurysmal  at 4.1 cm. This is stable since prior chest CT from 11/28/2016. Scarring in the lung bases. Hepatobiliary: No focal hepatic abnormality. Gallbladder unremarkable. Pancreas: No focal abnormality or ductal dilatation. Spleen: No focal abnormality.  Normal size. Adrenals/Urinary Tract: No adrenal abnormality. No focal renal abnormality. No stones or hydronephrosis. Urinary bladder is unremarkable. Stomach/Bowel: Left colonic diverticulosis. No active diverticulitis.  Vascular/Lymphatic: Aortic atherosclerosis. No enlarged abdominal or pelvic lymph nodes. Reproductive: Uterus and adnexa unremarkable.  No mass. Other: No free fluid or free air. Musculoskeletal: No acute bony abnormality. Degenerative changes in the lumbar spine. IMPRESSION: No acute findings in the abdomen or pelvis. Left colonic diverticulosis.  No active diverticulitis. Aortic atherosclerosis. Mild aneurysmal dilatation of the aortic root, stable since January 2018. Recommend annual imaging followup by CTA or MRA. This recommendation follows 2010 ACCF/AHA/AATS/ACR/ASA/SCA/SCAI/SIR/STS/SVM Guidelines for the Diagnosis and Management of Patients with Thoracic Aortic Disease. Circulation. 2010; 121: [id number] Coronary artery disease. Electronically Signed   By: Rolm Baptise M.D.   On: 10/02/2018 21:28   Dg Chest Port 1 View  Result Date: 10/02/2018 CLINICAL DATA:  Shortness of breath. EXAM: PORTABLE CHEST 1 VIEW COMPARISON:  07/23/2018 FINDINGS: Chronic interstitial lung densities in both lungs, left side greater than right. No new airspace disease or consolidation. Heart size is stable. Atherosclerotic calcifications at the aortic arch. Trachea is midline. Negative for pneumothorax. IMPRESSION: Chronic lung disease is compatible with fibrosis. No acute findings. Electronically Signed   By: Markus Daft M.D.   On: 10/02/2018 19:09    Assessment and Plan:  Linda Kelly is a 84 y.o. female with severe un-revascularizable multivessel coronary artery disease (chronic total occlusion of the mid-LAD, chronic total occlusion of the mid RCA, 99% stenosis of the distal LCx), ischemic cardiomyopathy with left ventricular ejection fraction of 40%, peripheral vascular disease including abdominal aortic aneurysm, right iliac, left subclavian and carotid stenosis status-post right carotid endarterectomy, pulmonary fibrosis on home oxygen who is being seen today for the evaluation of elevated troponin in the context of  septic shock.  1. Septic shock 2. Acute on chronic hypoxic respiratory failure 3. Acute on chronic kidney injury 4. Acute hepatocellular injury, likely due to shock 5. NSTEMI, likely demand type 2  Mrs. Aina presents with fever,chills, nausea, vomiting, cough, abdominal discomfort. She has a leukocytosis to 14.8. She has hypotension and markedly elevated lactic acid consistent with septic shock. Her urine is consistent with urinary tract infection and chest x-ray demonstrates potential infiltrate both of which are potential infectious sources. In the context of septic shock with markedly elevated lactate and biochemical evidence of hepatic and renal injury, it is not surprising her heart has also been injured by global hypoperfusion (type 2 demand MI).   - Continue dual anti-platelet therapy - Hold off on IV UFH, and would not recommend coronary angiography with lack of revascularization options - Supportive care and ongoing goals of care discussions, consider palliative care consultation for symptom management   For questions or updates, please contact [facility] Please consult [email or web address] for contact info under Cardiology/STEMI.    Signed, Perley Jain, MD  10/03/2018 2:25 AM

## 2018-10-03 NOTE — ED Notes (Signed)
ORDERED A HEART HEALTHY BREAKFAST--Linda Kelly

## 2018-10-03 NOTE — ED Notes (Signed)
Called admitting to make aware of pressures and he said give bolus and notify if any changes.

## 2018-10-04 DIAGNOSIS — R748 Abnormal levels of other serum enzymes: Secondary | ICD-10-CM

## 2018-10-04 DIAGNOSIS — N390 Urinary tract infection, site not specified: Secondary | ICD-10-CM

## 2018-10-04 DIAGNOSIS — N179 Acute kidney failure, unspecified: Secondary | ICD-10-CM

## 2018-10-04 LAB — COMPREHENSIVE METABOLIC PANEL WITH GFR
ALT: 2347 U/L — ABNORMAL HIGH (ref 0–44)
AST: 2349 U/L — ABNORMAL HIGH (ref 15–41)
Albumin: 3.2 g/dL — ABNORMAL LOW (ref 3.5–5.0)
Alkaline Phosphatase: 73 U/L (ref 38–126)
Anion gap: 12 (ref 5–15)
BUN: 30 mg/dL — ABNORMAL HIGH (ref 8–23)
CO2: 23 mmol/L (ref 22–32)
Calcium: 8.3 mg/dL — ABNORMAL LOW (ref 8.9–10.3)
Chloride: 108 mmol/L (ref 98–111)
Creatinine, Ser: 1.29 mg/dL — ABNORMAL HIGH (ref 0.44–1.00)
GFR calc Af Amer: 43 mL/min — ABNORMAL LOW
GFR calc non Af Amer: 37 mL/min — ABNORMAL LOW
Glucose, Bld: 129 mg/dL — ABNORMAL HIGH (ref 70–99)
Potassium: 3.6 mmol/L (ref 3.5–5.1)
Sodium: 143 mmol/L (ref 135–145)
Total Bilirubin: 1 mg/dL (ref 0.3–1.2)
Total Protein: 6.1 g/dL — ABNORMAL LOW (ref 6.5–8.1)

## 2018-10-04 LAB — CBC
HCT: 34.1 % — ABNORMAL LOW (ref 36.0–46.0)
Hemoglobin: 10.7 g/dL — ABNORMAL LOW (ref 12.0–15.0)
MCH: 30.7 pg (ref 26.0–34.0)
MCHC: 31.4 g/dL (ref 30.0–36.0)
MCV: 98 fL (ref 80.0–100.0)
Platelets: 97 10*3/uL — ABNORMAL LOW (ref 150–400)
RBC: 3.48 MIL/uL — ABNORMAL LOW (ref 3.87–5.11)
RDW: 15.1 % (ref 11.5–15.5)
WBC: 20.8 10*3/uL — ABNORMAL HIGH (ref 4.0–10.5)
nRBC: 0.1 % (ref 0.0–0.2)

## 2018-10-04 LAB — HEPARIN LEVEL (UNFRACTIONATED): Heparin Unfractionated: <0.1 IU/mL — ABNORMAL LOW (ref 0.30–0.70)

## 2018-10-04 LAB — MAGNESIUM: Magnesium: 2 mg/dL (ref 1.7–2.4)

## 2018-10-04 LAB — HEPATITIS PANEL, ACUTE
HCV Ab: <0.1 s/co ratio (ref 0.0–0.9)
Hep A IgM: NEGATIVE
Hep B C IgM: NEGATIVE
Hepatitis B Surface Ag: NEGATIVE

## 2018-10-04 LAB — UREA NITROGEN, URINE: Urea Nitrogen, Ur: 332 mg/dL

## 2018-10-04 MED ORDER — HEPARIN (PORCINE) 25000 UT/250ML-% IV SOLN
1050.0000 [IU]/h | INTRAVENOUS | Status: DC
  Administered 2018-10-04: 700 [IU]/h via INTRAVENOUS
  Administered 2018-10-05: 950 [IU]/h via INTRAVENOUS
  Administered 2018-10-06: 1050 [IU]/h via INTRAVENOUS
  Filled 2018-10-04 (×3): qty 250

## 2018-10-04 MED ORDER — NITROGLYCERIN 0.4 MG SL SUBL
0.4000 mg | SUBLINGUAL_TABLET | Freq: Once | SUBLINGUAL | Status: AC
  Administered 2018-10-04: 0.4 mg via SUBLINGUAL
  Filled 2018-10-04: qty 1

## 2018-10-04 MED ORDER — ACETAMINOPHEN 325 MG PO TABS
325.0000 mg | ORAL_TABLET | Freq: Four times a day (QID) | ORAL | Status: DC | PRN
  Administered 2018-10-04 – 2018-10-06 (×4): 325 mg via ORAL
  Filled 2018-10-04 (×4): qty 1

## 2018-10-04 MED ORDER — HEPARIN BOLUS VIA INFUSION
3000.0000 [IU] | Freq: Once | INTRAVENOUS | Status: AC
  Administered 2018-10-04: 3000 [IU] via INTRAVENOUS
  Filled 2018-10-04: qty 3000

## 2018-10-04 MED ORDER — ISOSORBIDE MONONITRATE ER 30 MG PO TB24
30.0000 mg | ORAL_TABLET | Freq: Every day | ORAL | Status: DC
  Administered 2018-10-04 – 2018-10-11 (×8): 30 mg via ORAL
  Filled 2018-10-04 (×8): qty 1

## 2018-10-04 MED ORDER — METOPROLOL SUCCINATE ER 25 MG PO TB24
25.0000 mg | ORAL_TABLET | Freq: Every day | ORAL | Status: DC
  Administered 2018-10-04 – 2018-10-11 (×8): 25 mg via ORAL
  Filled 2018-10-04 (×8): qty 1

## 2018-10-04 MED ORDER — ASPIRIN EC 81 MG PO TBEC: 81.0000 mg | DELAYED_RELEASE_TABLET | Freq: Every day | ORAL | Status: DC

## 2018-10-04 MED ORDER — HEPARIN BOLUS VIA INFUSION
1500.0000 [IU] | Freq: Once | INTRAVENOUS | Status: AC
  Administered 2018-10-04: 1500 [IU] via INTRAVENOUS
  Filled 2018-10-04: qty 1500

## 2018-10-04 NOTE — Progress Notes (Addendum)
ANTICOAGULATION CONSULT NOTE  Pharmacy Consult for heparin  Indication: chest pain/ACS  No Known Allergies  Patient Measurements: Heparin Dosing Weight: 57 kg  Vital Signs: Temp: 97 F (36.1 C) (12/07 0823) Temp Source: Axillary (12/07 0823) BP: 155/91 (12/07 0955) Pulse Rate: 100 (12/07 0955)  Labs: Recent Labs    10/02/18 1928 10/03/18 0100 10/03/18 0748 10/03/18 1407 10/04/18 0227  HGB 11.6*  --  10.5*  --  10.7*  HCT 37.4  --  35.4*  --  34.1*  PLT 143*  --  101*  --  97*  CREATININE 2.31*  --  1.77*  --  1.29*  CKTOTAL  --  343*  --   --   --   TROPONINI  --  12.59* 12.79* 9.44*  --     Estimated Creatinine Clearance: 25.6 mL/min (A) (by C-G formula based on SCr of 1.29 mg/dL (H)).  Assessment: 84 y/o F here with N/V x 24 hours, found to have elevated troponin thought to be due to demand ischemia; heparin drip ordered but never started. She has severe multivessel CAD with no plan for cardiac interventions. This morning she has chest pain and ECG changes. Pharmacy consulted to begin IV heparin for 48h. No bleeding noted, Hgb low stable, platelets down to 97 which could be result of sepsis.  Goal of Therapy:  Heparin level 0.3-0.7 units/ml Monitor platelets by anticoagulation protocol: Yes   Plan:  Heparin 3000 units IV bolus then 700 units/hr - discontinue after 48 hr 8 hr heparin level Daily heparin level and CBC Monitor for s/sx of bleeding   Renold Genta, PharmD, BCPS Clinical Pharmacist Clinical phone for 10/04/2018 until 3p is [phone] 10/04/2018 10:46 AM  **Pharmacist phone directory can now be found on [email or web address] listed under [place]**

## 2018-10-04 NOTE — Progress Notes (Signed)
12/[id number] Patient reported chest pain and work of breathing at 0955. Patient said was a 5 steady mid chest. Patient was given Tylenol  V/s 155/91, FNTC 15 Liters sating 100%, HR 98, RR 31 to 38. 12 lead EKG result Sinus Rhythm with frequent PVCs. Dr Thereasa Solo was made aware. Rn spoke to cardiology patient receive one time dose on Nitro SL 0.4. And order was place for heparin drip. [facility] RN.

## 2018-10-04 NOTE — Evaluation (Signed)
Physical Therapy Evaluation Patient Details Name: Linda Kelly MRN: [id number] DOB: 06/28/1928 Today's Date: 10/04/2018   History of Present Illness  84 yo female with onset of chest pain and EKG changes was admitted for sepsis and UTI. Had  shock liver, elevated troponin from demand ischemia, R iliac artery obstruction, acute respiratory failure, acute renal failure.  Home with daughter to assist her.  PMHx:  EF 40-45%, CAD multi vessel, home O2 4-5 L, pulmonary fibrosis  Clinical Impression  Pt was seen for correction of body mechanics and mobility side of bed to standing, and was able to transition sidesteps in a limited way.  Follow her up with HHPT and daughter to stay with her, as family and pt are committed to assisting her and avoiding a rehab stay.  Continue acutely with balance and endurance including encouraging OOB and to increase her tolerance for standing work.      Follow Up Recommendations Home health PT;Supervision for mobility/OOB    Equipment Recommendations  Rolling walker with 5" wheels    Recommendations for Other Services       Precautions / Restrictions Precautions Precautions: Fall Precaution Comments: telemetry Restrictions Weight Bearing Restrictions: No      Mobility  Bed Mobility Overal bed mobility: Needs Assistance Bed Mobility: Supine to Sit;Sit to Supine     Supine to sit: Mod assist Sit to supine: Mod assist   General bed mobility comments: using bedrail and bed pad to support and shift  Transfers Overall transfer level: Needs assistance Equipment used: Rolling walker (2 wheeled);1 person hand held assist Transfers: Sit to/from Stand Sit to Stand: Min assist         General transfer comment: assisted from higher surface  Ambulation/Gait Ambulation/Gait assistance: Min assist Gait Distance (Feet): 3 Feet Assistive device: Rolling walker (2 wheeled);1 person hand held assist Gait Pattern/deviations: Step-to pattern;Decreased stride  length;Wide base of support(sidedstepping) Gait velocity: reduced Gait velocity interpretation: <1.8 ft/sec, indicate of risk for recurrent falls General Gait Details: shifting wgt and needs help to control transition of walker  Stairs            Wheelchair Mobility    Modified Rankin (Stroke Patients Only)       Balance Overall balance assessment: Needs assistance Sitting-balance support: Feet supported Sitting balance-Leahy Scale: Fair     Standing balance support: Bilateral upper extremity supported;During functional activity Standing balance-Leahy Scale: Poor                               Pertinent Vitals/Pain Pain Assessment: No/denies pain    Home Living Family/patient expects to be discharged to:: Private residence Living Arrangements: Children Available Help at Discharge: Family;Available 24 hours/day Type of Home: House Home Access: Stairs to enter Entrance Stairs-Rails: Right Entrance Stairs-Number of Steps: 1 Home Layout: One level Home Equipment: Walker - 4 wheels;Cane - single point Additional Comments: pt has lived independently but family can come stay as needed    Prior Function Level of Independence: Independent with assistive device(s)               Hand Dominance   Dominant Hand: Right    Extremity/Trunk Assessment                Communication   Communication: No difficulties  Cognition Arousal/Alertness: Awake/alert Behavior During Therapy: WFL for tasks assessed/performed Overall Cognitive Status: Within Functional Limits for tasks assessed  General Comments: using purwick and on O2 via nasal cannula      General Comments      Exercises     Assessment/Plan    PT Assessment Patient needs continued PT services  PT Problem List Decreased strength;Decreased range of motion;Decreased activity tolerance;Decreased coordination;Decreased balance;Decreased  mobility;Decreased knowledge of use of DME;Decreased safety awareness;Cardiopulmonary status limiting activity       PT Treatment Interventions DME instruction;Gait training;Stair training;Functional mobility training;Therapeutic activities;Therapeutic exercise;Balance training;Neuromuscular re-education;Patient/family education    PT Goals (Current goals can be found in the Care Plan section)  Acute Rehab PT Goals Patient Stated Goal: none stated PT Goal Formulation: With patient/family Time For Goal Achievement: 10/18/18 Potential to Achieve Goals: Good    Frequency Min 4X/week   Barriers to discharge Inaccessible home environment;Decreased caregiver support home alone prior to her admission    Co-evaluation               AM-PAC PT "6 Clicks" Mobility  Outcome Measure Help needed turning from your back to your side while in a flat bed without using bedrails?: A Little Help needed moving from lying on your back to sitting on the side of a flat bed without using bedrails?: A Lot Help needed moving to and from a bed to a chair (including a wheelchair)?: A Lot Help needed standing up from a chair using your arms (e.g., wheelchair or bedside chair)?: A Little Help needed to walk in hospital room?: A Little Help needed climbing 3-5 steps with a railing? : Total 6 Click Score: 14    End of Session Equipment Utilized During Treatment: Gait belt;Oxygen Activity Tolerance: Patient limited by fatigue;Treatment limited secondary to medical complications (Comment) Patient left: in bed;with call bell/phone within reach;with bed alarm set;with family/visitor present Nurse Communication: Mobility status PT Visit Diagnosis: Unsteadiness on feet (R26.81);Muscle weakness (generalized) (M62.81);Difficulty in walking, not elsewhere classified (R26.2)    Time: 4650-3546 PT Time Calculation (min) (ACUTE ONLY): 28 min   Charges:   PT Evaluation $PT Eval Moderate Complexity: 1 Mod PT  Treatments $Therapeutic Activity: 8-22 mins       Ramond Dial 10/04/2018, 5:23 PM   Mee Hives, PT MS Acute Rehab Dept. Number: [facility]

## 2018-10-04 NOTE — Progress Notes (Signed)
ANTICOAGULATION CONSULT NOTE  Pharmacy Consult for heparin  Indication: chest pain/ACS  No Known Allergies  Patient Measurements: Heparin Dosing Weight: 57 kg  Vital Signs: Temp: 97.4 F (36.3 C) (12/07 1958) Temp Source: Oral (12/07 1958) BP: 120/73 (12/07 1958) Pulse Rate: 90 (12/07 1958)  Labs: Recent Labs    10/02/18 1928 10/03/18 0100 10/03/18 0748 10/03/18 1407 10/04/18 0227 10/04/18 1936  HGB 11.6*  --  10.5*  --  10.7*  --   HCT 37.4  --  35.4*  --  34.1*  --   PLT 143*  --  101*  --  97*  --   HEPARINUNFRC  --   --   --   --   --  <0.10*  CREATININE 2.31*  --  1.77*  --  1.29*  --   CKTOTAL  --  343*  --   --   --   --   TROPONINI  --  12.59* 12.79* 9.44*  --   --     Estimated Creatinine Clearance: 25.6 mL/min (A) (by C-G formula based on SCr of 1.29 mg/dL (H)).  Assessment: 84 y/o F here with N/V x 24 hours, found to have elevated troponin thought to be due to demand ischemia; heparin drip ordered but never started. She has severe multivessel CAD with no plan for cardiac interventions. This morning she has chest pain and ECG changes. Pharmacy consulted to begin IV heparin for 48h.  Heparin level this evening resulted as SUBtherapeutic (HL<0.1, goal of 0.3-0.7). Noted low platelets thought to be related to sepsis - will monitor.   Goal of Therapy:  Heparin level 0.3-0.7 units/ml Monitor platelets by anticoagulation protocol: Yes   Plan:  - Heparin 1500 unit bolus x 1 - Increase Heparin to 950 units/hr (9.5 ml/hr) - Continue with stop date/time after 48h on 12/9 @ 1100 - Will continue to monitor for any signs/symptoms of bleeding and will follow up with heparin level in 8 hours   Thank you for allowing pharmacy to be a part of this patient's care.  Alycia Rossetti, PharmD, BCPS Clinical Pharmacist Please check AMION for all [facility] numbers 10/04/2018 9:38 PM

## 2018-10-04 NOTE — Progress Notes (Signed)
Progress Note  Patient Name: Linda Kelly Date of Encounter: 10/04/2018  Primary Cardiologist: Kirk Ruths, MD   Subjective   Having chest pain and feeling more short of breath.   Inpatient Medications    Scheduled Meds: . aspirin EC  81 mg Oral Daily  . clopidogrel  75 mg Oral Daily  . ipratropium-albuterol  3 mL Nebulization TID  . metoprolol succinate  25 mg Oral Daily  . montelukast  10 mg Oral QHS  . nitroGLYCERIN  0.4 mg Sublingual Once  . predniSONE  40 mg Oral Q breakfast   Continuous Infusions: . sodium chloride 50 mL/hr at 10/04/18 0937  . cefTRIAXone (ROCEPHIN)  IV Stopped (10/03/18 2040)   PRN Meds: acetaminophen, albuterol, ALPRAZolam, prochlorperazine   Vital Signs    Vitals:   10/04/18 0446 10/04/18 0823 10/04/18 0826 10/04/18 0955  BP: (!) 134/117 (!) 152/67  (!) 155/91  Pulse: 61 90  100  Resp: (!) 31 (!) 21  (!) 31  Temp: (!) 97.5 F (36.4 C) (!) 97 F (36.1 C)    TempSrc: Oral Axillary    SpO2: 95% 98% 100% 100%  Weight:      Height:        Intake/Output Summary (Last 24 hours) at 10/04/2018 1038 Last data filed at 10/04/2018 9030 Gross per 24 hour  Intake 1376.67 ml  Output 100 ml  Net 1276.67 ml   Filed Weights   10/03/18 2131 10/04/18 0420  Weight: 69.7 kg 70.4 kg    Telemetry    Sinus rhythm.  PVCs - Personally Reviewed  ECG    Sinus rhythm.  Rate 99 bpm.  PVCs.  Anterolateral TWI.  QTc 551 - Personally Reviewed  Physical Exam   VS:  BP (!) 155/91 (BP Location: Right Arm)   Pulse 100   Temp (!) 97 F (36.1 C) (Axillary)   Resp (!) 31   Ht 4' 9.99" (1.473 m)   Wt 70.4 kg   SpO2 100%   BMI 32.45 kg/m  , BMI Body mass index is 32.45 kg/m. GENERAL:  Chronically ill-appearing.  Mild respiratory distress HEENT: Pupils equal round and reactive, fundi not visualized, oral mucosa unremarkable NECK:  No jugular venous distention, waveform within normal limits, carotid upstroke brisk and symmetric, no bruits, no  thyromegaly LUNGS:  Clear to auscultation bilaterally HEART:  RRR.  PMI not displaced or sustained,S1 and S2 within normal limits, no S3, no S4, no clicks, no rubs, no murmurs ABD:  Flat, positive bowel sounds normal in frequency in pitch, no bruits, no rebound, no guarding, no midline pulsatile mass, no hepatomegaly, no splenomegaly EXT:  2 plus pulses throughout, no edema, no cyanosis no clubbing SKIN:  No rashes no nodules NEURO:  Cranial nerves II through XII grossly intact, motor grossly intact throughout [facility]:  Cognitively intact, oriented to person place and time   Labs    Chemistry Recent Labs  Lab 10/02/18 1928 10/03/18 0748 10/04/18 0227  NA 143 144 143  K 4.0 4.0 3.6  CL 98 111 108  CO2 22 21* 23  GLUCOSE 143* 113* 129*  BUN 26* 28* 30*  CREATININE 2.31* 1.77* 1.29*  CALCIUM 8.8* 7.3* 8.3*  PROT 7.4 5.8* 6.1*  ALBUMIN 3.9 3.2* 3.2*  AST 1,823* 2,984* 2,349*  ALT 1,406* 1,998* 2,347*  ALKPHOS 78 64 73  BILITOT 1.3* 1.2 1.0  GFRNONAA 18* 25* 37*  GFRAA 21* 29* 43*  ANIONGAP 23* 12 12     Hematology Recent  Labs  Lab 10/02/18 1928 10/03/18 0748 10/04/18 0227  WBC 14.8* 20.6* 20.8*  RBC 3.79* 3.38* 3.48*  HGB 11.6* 10.5* 10.7*  HCT 37.4 35.4* 34.1*  MCV 98.7 104.7* 98.0  MCH 30.6 31.1 30.7  MCHC 31.0 29.7* 31.4  RDW 15.0 15.2 15.1  PLT 143* 101* 97*    Cardiac Enzymes Recent Labs  Lab 10/03/18 0100 10/03/18 0748 10/03/18 1407  TROPONINI 12.59* 12.79* 9.44*   No results for input(s): TROPIPOC in the last 168 hours.   BNPNo results for input(s): BNP, PROBNP in the last 168 hours.   DDimer No results for input(s): DDIMER in the last 168 hours.   Radiology    Ct Abdomen Pelvis Wo Contrast  Result Date: 10/02/2018 CLINICAL DATA:  Abdominal pain, fever, sepsis EXAM: CT ABDOMEN AND PELVIS WITHOUT CONTRAST TECHNIQUE: Multidetector CT imaging of the abdomen and pelvis was performed following the standard protocol without IV contrast. COMPARISON:   None. FINDINGS: Lower chest: Mild cardiomegaly. Dense coronary artery calcifications. Visualized aortic root mildly aneurysmal at 4.1 cm. This is stable since prior chest CT from 11/28/2016. Scarring in the lung bases. Hepatobiliary: No focal hepatic abnormality. Gallbladder unremarkable. Pancreas: No focal abnormality or ductal dilatation. Spleen: No focal abnormality.  Normal size. Adrenals/Urinary Tract: No adrenal abnormality. No focal renal abnormality. No stones or hydronephrosis. Urinary bladder is unremarkable. Stomach/Bowel: Left colonic diverticulosis. No active diverticulitis. Vascular/Lymphatic: Aortic atherosclerosis. No enlarged abdominal or pelvic lymph nodes. Reproductive: Uterus and adnexa unremarkable.  No mass. Other: No free fluid or free air. Musculoskeletal: No acute bony abnormality. Degenerative changes in the lumbar spine. IMPRESSION: No acute findings in the abdomen or pelvis. Left colonic diverticulosis.  No active diverticulitis. Aortic atherosclerosis. Mild aneurysmal dilatation of the aortic root, stable since January 2018. Recommend annual imaging followup by CTA or MRA. This recommendation follows 2010 ACCF/AHA/AATS/ACR/ASA/SCA/SCAI/SIR/STS/SVM Guidelines for the Diagnosis and Management of Patients with Thoracic Aortic Disease. Circulation. 2010; 121: T700-F749 Coronary artery disease. Electronically Signed   By: Rolm Baptise M.D.   On: 10/02/2018 21:28   Dg Chest Port 1 View  Result Date: 10/02/2018 CLINICAL DATA:  Shortness of breath. EXAM: PORTABLE CHEST 1 VIEW COMPARISON:  07/23/2018 FINDINGS: Chronic interstitial lung densities in both lungs, left side greater than right. No new airspace disease or consolidation. Heart size is stable. Atherosclerotic calcifications at the aortic arch. Trachea is midline. Negative for pneumothorax. IMPRESSION: Chronic lung disease is compatible with fibrosis. No acute findings. Electronically Signed   By: Markus Daft M.D.   On: 10/02/2018  19:09    Cardiac Studies   08/10/2016 [facility] personally reviewed  Complicated procedure that demonstrated high-grade obstruction in the right iliac arterypreventing the procedure from being completed and requiring crossover to the left femoral artery.  Inability to advance the pulmonary artery catheter into the pulmonary artery or capillary wedge position. RV systolic pressure is mildly to moderately elevated.  Severe multivessel coronary disease with chronic total occlusion of the mid RCAcollateralized by right to right and left-to-right sources.  Total occlusion of the mid LAD beyond a large first diagonal. Left left collaterals are noted.  99% stenosis in the mid to distal native circumflex beyond a very tortuous segment. PCI was not attempted. Distal territory is relatively small.  Inferior wall severe hypokinesis involving the basal to mid segment. Anterior and apical mild hypokinesis is noted. Ejection fraction is estimated to be 35-45%.Presentation was that ofacute on chronic combined systolic and diastolic heart failure.  08/10/2016 TTE  LVEF=40-45% Moderate MAC  mild-moderate MR Rv normal Trivial TR10/13/2017 [facility] personally reviewed  Complicated procedure that demonstrated high-grade obstruction in the right iliac arterypreventing the procedure from being completed and requiring crossover to the left femoral artery.  Inability to advance the pulmonary artery catheter into the pulmonary artery or capillary wedge position. RV systolic pressure is mildly to moderately elevated.  Severe multivessel coronary disease with chronic total occlusion of the mid RCAcollateralized by right to right and left-to-right sources.  Total occlusion of the mid LAD beyond a large first diagonal. Left left collaterals are noted.  99% stenosis in the mid to distal native circumflex beyond a very tortuous segment. PCI was not attempted. Distal territory is relatively small.  Inferior wall  severe hypokinesis involving the basal to mid segment. Anterior and apical mild hypokinesis is noted. Ejection fraction is estimated to be 35-45%.Presentation was that ofacute on chronic combined systolic and diastolic heart failure.  Patient Profile     84 y.o. female with severe multivessel CAD not amenable to revascularization, chronic systolic and diastolic heart failure (LVEF 40 to 45%), ischemic cardiomyopathy, pulmonary fibrosis here with severe sepsis secondary to a urinary tract infection.  Cardiology was consulted for elevated troponin.  Assessment & Plan    # Severe sepsis from a urinary source: BP is now elevated.  Will resume her home metoprolol and Imdur.  Hold furosemide.  Antibiotics per primary team.    # Elevated troponin: # Severe multivessel CAD: This AM Ms. B[place]acqua reports chest pain and her EKG shows anterolateral T wave inversions that were not present yesterday.  Troponin peaked at 12.8.  She was initially not started on heparin as her elevated troponin was thought to be due to demand ischemia.  However, given her chest pain and dynamic EKG changes we will start heparin with plans for 48 hours of therapy.  She is not a candidate for any further cardiac interventions.  Continue clopidogrel and aspirin.  We will resume her home metoprolol and isosorbide mononitrate as above.  # Shock liver: Slowly improving.  Holding home simvastatin and ezetimibe.  # Acute on chronic hypoxic respiratory failure: She is on 15 L of high flow nasal cannula.  We will avoid morphine for her chest pain if possible.  # Acute renal failure: Improving.  Creatinine was elevated to 2.3 and is now 1.3.      For questions or updates, please contact [place] Please consult [email or web address] for contact info under        Signed, Skeet Latch, MD  10/04/2018, 10:38 AM

## 2018-10-04 NOTE — Progress Notes (Signed)
Linda Kelly - Stepdown/ICU TEAM  Linda Kelly  GBT:[id number] DOB: [birth year] DOA: 10/02/2018 PCP: Binnie Rail, MD    Brief Narrative:  84 y.o. female w/ a hx of pulmonary fibrosis on chronic home oxygen of 4-5 L, multivessel CAD followed by Dr.Brian Crenshaw, and combined systolic and diastolic heart failure (last EF 40-45%) who presented with a day of nausea and vomiting w/ subjective fever, chills, and malaise.   In the ED she was noted to be febrile to 101.4 F w/ O2 sats difficult to obtain on 5 L nasal cannula oxygen. Labs revealed WBC 14.8, creatinine 2.31, AST 1923, ALT 1406, and lactic acid 6.5, and repeat lactic acid 8.5.  Urinalysis showed large leukocytes and many bacteria.  Chest x-ray showed chronic pulmonary fibrosis without any other acute abnormalities.   Significant Events: 12/6 admit   Subjective: The patient reports that she feels "just a little bit better."  She is awake and alert and conversant.  She denies chest pain.  She reports ongoing shortness of breath above her baseline.  She denies abdominal pain cramps nausea or vomiting.  She reports a relatively poor appetite.  Assessment & Plan:  Severe sepsis secondary to urinary tract infection Await speciation and sensitivities on urine culture -continue empiric coverage with Rocephin  Hypotension Resolved w/ BP now modestly elevated - follow trend -slow volume resuscitation  Elevated troponin - Known severe un-revascularizable multivessel CAD Cardiology consulted at time of admit - known multivessel disease including chronic total occlusion of mid RCA, mid LAD, and 99% occlusion of the mid distal circumflex by cath in 07/2016 - not amenable to intervention - med tx only  Recent Labs  Lab 10/03/18 0100 10/03/18 0748 10/03/18 1407  TROPONINI 12.59* 12.79* 9.44*    Prolonged QT interval initial QTc 534 -choose medications wisely -monitor trend  Acute on chronic Hypoxic Respiratory failure - pulmonary  fibrosis on chronic immunosuppressive therapy Titrate O2 support as necessary   Acute renal failure creatinine Kelly.07 in March, but presented with a creatinine of 2.31 - crt rapidly improving w/ volume resuscitation   Recent Labs  Lab 10/02/18 1928 10/03/18 0748 10/04/18 0227  CREATININE 2.31* Kelly.77* Kelly.29*    Elevated liver enzymes Shock liver in setting of sepsis - appears LFTs have peaked - follow trend   Recent Labs  Lab 10/02/18 1928 10/03/18 0748 10/04/18 0227  AST Kelly,823* 2,984* 2,349*  ALT Kelly,406* Kelly,998* 2,347*  ALKPHOS 78 64 83  BILITOT Kelly.3* Kelly.2 Kelly.0  PROT 7.4 5.8* 6.Kelly*  ALBUMIN 3.9 3.2* 3.2*    Chronic combined systolic and diastolic CHF EF 71-06% in 07/2016 - baseline weights appears to be 64-65kg - follow Is/Os and daily weights -does not appear volume overloaded clinically at this time though weight is significantly above her baseline  Filed Weights   10/03/18 2131 10/04/18 0420  Weight: 69.7 kg 70.4 kg    Normocytic Anemia Hgb stable w/o evidence of gross blood loss   Anxiety Continue Xanax as needed  Hyperlipedema Hold medical tx until LFTs normal   Aneurysmal aortic root dilation followed in outpatient setting by cardiology   Thrombocytopenia likely a consequence of a gram neg infection  DVT prophylaxis: SCDs Code Status: DNR - NO CODE Family Communication: No family present at time of exam Disposition Plan: SDU  Consultants:  Cardiology   Antimicrobials:  Cefepime 12/5 Rocephin 12/6 >  Objective: Blood pressure (!) 152/67, pulse 90, temperature (!) 97 F (36.Kelly C), temperature source Axillary, resp. rate Marland Kitchen)  21, height 4' 9.99" (Kelly.473 m), weight 70.4 kg, SpO2 100 %.  Intake/Output Summary (Last 24 hours) at 10/04/2018 0857 Last data filed at 10/04/2018 5809 Gross per 24 hour  Intake 1376.67 ml  Output 100 ml  Net 1276.67 ml   Filed Weights   10/03/18 2131 10/04/18 0420  Weight: 69.7 kg 70.4 kg    Examination: General: Mild  respiratory distress on high flow nasal cannula -able to complete full sentences Lungs: Fine diffuse crackles Cardiovascular: Regular rate and rhythm without murmur  Abdomen: Nontender, nondistended, soft, bowel sounds positive, no rebound, no ascites, no appreciable mass Extremities: No significant cyanosis, clubbing, or edema bilateral lower extremities   CBC: Recent Labs  Lab 10/02/18 1928 10/03/18 0748 10/04/18 0227  WBC 14.8* 20.6* 20.8*  NEUTROABS 12.2*  --   --   HGB 11.6* 10.5* 10.7*  HCT 37.4 35.4* 34.Kelly*  MCV 98.7 104.7* 98.0  PLT 143* 101* 97*   Basic Metabolic Panel: Recent Labs  Lab 10/02/18 1928 10/03/18 0748 10/04/18 0227  NA 143 144 143  K 4.0 4.0 3.6  CL 98 111 108  CO2 22 21* 23  GLUCOSE 143* 113* 129*  BUN 26* 28* 30*  CREATININE 2.31* Kelly.77* Kelly.29*  CALCIUM 8.8* 7.3* 8.3*  MG  --   --  2.0   GFR: Estimated Creatinine Clearance: 25.6 mL/min (A) (by C-G formula based on SCr of Kelly.29 mg/dL (H)).  Liver Function Tests: Recent Labs  Lab 10/02/18 1928 10/03/18 0748 10/04/18 0227  AST Kelly,823* 2,984* 2,349*  ALT Kelly,406* Kelly,998* 2,347*  ALKPHOS 78 64 73  BILITOT Kelly.3* Kelly.2 Kelly.0  PROT 7.4 5.8* 6.Kelly*  ALBUMIN 3.9 3.2* 3.2*   Recent Labs  Lab 10/02/18 2138  LIPASE 28    Cardiac Enzymes: Recent Labs  Lab 10/03/18 0100 10/03/18 0748 10/03/18 1407  CKTOTAL 343*  --   --   TROPONINI 12.59* 12.79* 9.44*    HbA1C: Hgb A1c MFr Bld  Date/Time Value Ref Range Status  08/09/2016 01:40 AM 5.2 4.8 - 5.6 % Final    Comment:    (NOTE)         Pre-diabetes: 5.7 - 6.4         Diabetes: >6.4         Glycemic control for adults with diabetes: <7.0   02/28/2016 10:46 AM 5.4 4.6 - 6.5 % Final    Comment:    Glycemic Control Guidelines for People with Diabetes:Non Diabetic:  <6%Goal of Therapy: <7%Additional Action Suggested:  >8%      Recent Results (from the past 240 hour(s))  Blood culture (routine x 2)     Status: None (Preliminary result)   Collection  Time: 10/02/18  7:16 PM  Result Value Ref Range Status   Specimen Description BLOOD RIGHT ANTECUBITAL  Final   Special Requests   Final    BOTTLES DRAWN AEROBIC AND ANAEROBIC Blood Culture adequate volume   Culture   Final    NO GROWTH < 24 HOURS Performed at [facility], [address] [place], [place] [address]    Report Status PENDING  Incomplete  Blood culture (routine x 2)     Status: None (Preliminary result)   Collection Time: 10/02/18  7:33 PM  Result Value Ref Range Status   Specimen Description BLOOD RIGHT HAND  Final   Special Requests   Final    BOTTLES DRAWN AEROBIC AND ANAEROBIC Blood Culture results may not be optimal due to an inadequate volume  of blood received in culture bottles   Culture   Final    NO GROWTH < 24 HOURS Performed at [facility], [place] [address] [place], [place] [address]    Report Status PENDING  Incomplete  Urine culture     Status: Abnormal (Preliminary result)   Collection Time: 10/02/18  7:51 PM  Result Value Ref Range Status   Specimen Description URINE, RANDOM  Final   Special Requests   Final    NONE Performed at [facility], [place] [address] [place], [place] [address]    Culture >=100,000 COLONIES/mL GRAM NEGATIVE RODS (A)  Final   Report Status PENDING  Incomplete  Respiratory Panel by PCR     Status: None   Collection Time: 10/02/18 11:12 PM  Result Value Ref Range Status   Adenovirus NOT DETECTED NOT DETECTED Final   Coronavirus 229E NOT DETECTED NOT DETECTED Final   Coronavirus HKU1 NOT DETECTED NOT DETECTED Final   Coronavirus NL63 NOT DETECTED NOT DETECTED Final   Coronavirus OC43 NOT DETECTED NOT DETECTED Final   Metapneumovirus NOT DETECTED NOT DETECTED Final   Rhinovirus / Enterovirus NOT DETECTED NOT DETECTED Final   Influenza A NOT DETECTED NOT DETECTED Final   Influenza B NOT DETECTED NOT DETECTED Final   Parainfluenza Virus Kelly NOT DETECTED NOT DETECTED Final   Parainfluenza Virus 2 NOT DETECTED NOT  DETECTED Final   Parainfluenza Virus 3 NOT DETECTED NOT DETECTED Final   Parainfluenza Virus 4 NOT DETECTED NOT DETECTED Final   Respiratory Syncytial Virus NOT DETECTED NOT DETECTED Final   Bordetella pertussis NOT DETECTED NOT DETECTED Final   Chlamydophila pneumoniae NOT DETECTED NOT DETECTED Final   Mycoplasma pneumoniae NOT DETECTED NOT DETECTED Final    Comment: Performed at [facility], [place] [address] [place], [place] [address]  MRSA PCR Screening     Status: None   Collection Time: 10/03/18  2:20 PM  Result Value Ref Range Status   MRSA by PCR NEGATIVE NEGATIVE Final    Comment:        The GeneXpert MRSA Assay (FDA approved for NASAL specimens only), is one component of a comprehensive MRSA colonization surveillance program. It is not intended to diagnose MRSA infection nor to guide or monitor treatment for MRSA infections. Performed at [facility], [place] [address], [place], [place] [address]      Scheduled Meds: . aspirin EC  81 mg Oral Daily  . clopidogrel  75 mg Oral Daily  . ipratropium-albuterol  3 mL Nebulization TID  . montelukast  10 mg Oral QHS  . predniSONE  40 mg Oral Q breakfast   Continuous Infusions: . sodium chloride 100 mL/hr at 10/04/18 0417  . cefTRIAXone (ROCEPHIN)  IV Stopped (10/03/18 2040)     LOS: 2 days   Cherene Altes, MD [facility] Office  [phone] Pager - Text Page per Amion as per below:  On-Call/Text Page:      [email or web address]  If 7PM-7AM, please contact night-coverage [email or web address] 10/04/2018, 8:57 AM

## 2018-10-05 DIAGNOSIS — Z9861 Coronary angioplasty status: Secondary | ICD-10-CM

## 2018-10-05 DIAGNOSIS — I2510 Atherosclerotic heart disease of native coronary artery without angina pectoris: Secondary | ICD-10-CM

## 2018-10-05 DIAGNOSIS — I5041 Acute combined systolic (congestive) and diastolic (congestive) heart failure: Secondary | ICD-10-CM

## 2018-10-05 LAB — URINE CULTURE: Culture: >=100000 — AB

## 2018-10-05 LAB — MAGNESIUM: Magnesium: 1.9 mg/dL (ref 1.7–2.4)

## 2018-10-05 LAB — BASIC METABOLIC PANEL
ANION GAP: 14 (ref 5–15)
BUN: 25 mg/dL — ABNORMAL HIGH (ref 8–23)
CO2: 21 mmol/L — ABNORMAL LOW (ref 22–32)
Calcium: 8.6 mg/dL — ABNORMAL LOW (ref 8.9–10.3)
Chloride: 109 mmol/L (ref 98–111)
Creatinine, Ser: 0.73 mg/dL (ref 0.44–1.00)
GFR calc non Af Amer: >60 mL/min (ref >60)
Glucose, Bld: 130 mg/dL — ABNORMAL HIGH (ref 70–99)
Potassium: 4.1 mmol/L (ref 3.5–5.1)
Sodium: 144 mmol/L (ref 135–145)

## 2018-10-05 LAB — CBC
HCT: 31.6 % — ABNORMAL LOW (ref 36.0–46.0)
Hemoglobin: 9.7 g/dL — ABNORMAL LOW (ref 12.0–15.0)
MCH: 30.8 pg (ref 26.0–34.0)
MCHC: 30.7 g/dL (ref 30.0–36.0)
MCV: 100.3 fL — ABNORMAL HIGH (ref 80.0–100.0)
PLATELETS: 98 10*3/uL — AB (ref 150–400)
RBC: 3.15 MIL/uL — ABNORMAL LOW (ref 3.87–5.11)
RDW: 15 % (ref 11.5–15.5)
WBC: 15.5 10*3/uL — ABNORMAL HIGH (ref 4.0–10.5)
nRBC: 0.3 % — ABNORMAL HIGH (ref 0.0–0.2)

## 2018-10-05 LAB — PHOSPHORUS: Phosphorus: 2.8 mg/dL (ref 2.5–4.6)

## 2018-10-05 LAB — HEPARIN LEVEL (UNFRACTIONATED)
Heparin Unfractionated: 0.24 IU/mL — ABNORMAL LOW (ref 0.30–0.70)
Heparin Unfractionated: 0.4 IU/mL (ref 0.30–0.70)

## 2018-10-05 MED ORDER — FUROSEMIDE 10 MG/ML IJ SOLN
20.0000 mg | Freq: Two times a day (BID) | INTRAMUSCULAR | Status: DC
  Administered 2018-10-05 – 2018-10-06 (×3): 20 mg via INTRAVENOUS
  Filled 2018-10-05 (×3): qty 2

## 2018-10-05 NOTE — Progress Notes (Signed)
ANTICOAGULATION CONSULT NOTE - Follow Up Consult  Pharmacy Consult for Heparin  Indication: chest pain/ACS  No Known Allergies  Patient Measurements: Height: 4' 9.99" (147.3 cm) Weight: 155 lb 3.3 oz (70.4 kg) IBW/kg (Calculated) : 40.88 Heparin Dosing Weight: 57 kg  Vital Signs: Temp: 97.4 F (36.3 C) (12/08 0855) Temp Source: Oral (12/08 0855) BP: 140/80 (12/08 0855) Pulse Rate: 83 (12/08 0855)  Labs: Recent Labs    10/03/18 0100 10/03/18 0748 10/03/18 1407 10/04/18 0227 10/04/18 1936 10/05/18 0634  HGB  --  10.5*  --  10.7*  --  9.7*  HCT  --  35.4*  --  34.1*  --  31.6*  PLT  --  101*  --  97*  --  98*  HEPARINUNFRC  --   --   --   --  <0.10* 0.24*  CREATININE  --  1.77*  --  1.29*  --  0.73  CKTOTAL 343*  --   --   --   --   --   TROPONINI 12.59* 12.79* 9.44*  --   --   --     Estimated Creatinine Clearance: 41.2 mL/min (by C-G formula based on SCr of 0.73 mg/dL).  Assessment:   84 y/o F here with N/V x 24 hours, found to have elevated troponin thought to be due to demand ischemia; heparin drip ordered but never started. She has severe multivessel CAD with no plan for cardiac interventions. Chest pain and ECG changes on 12/7 and Pharmacy consulted to begin IV heparin for 48h. No bleeding noted, Hgb low stable, platelets down to 97 which could be result of sepsis.    Heparin level is subtherapeutic (0.24) on 950 units/hr.  Hgb trended down to 9.7, platelet count 98, no further drop.  No bleeding reported.  Goal of Therapy:  Heparin level 0.3-0.7 units/ml Monitor platelets by anticoagulation protocol: Yes   Plan:   Increase heparin drip to 1050 units/hr  Heparin level ~8 hrs after rate change.  Daily heparin level and CBC while on heparin.  Heparin for 48hrs  to stop ~11am on 12/9.  Arty Baumgartner, [place] Pager: [phone] or phone: [phone] 10/05/2018,9:08 AM

## 2018-10-05 NOTE — Progress Notes (Signed)
ANTICOAGULATION CONSULT NOTE - Follow Up Consult  Pharmacy Consult for Heparin  Indication: chest pain/ACS  No Known Allergies  Patient Measurements: Height: 4' 9.99" (147.3 cm) Weight: 155 lb 3.3 oz (70.4 kg) IBW/kg (Calculated) : 40.88 Heparin Dosing Weight: 57 kg  Vital Signs: Temp: 98.2 F (36.8 C) (12/08 1939) Temp Source: Oral (12/08 1939) BP: 133/73 (12/08 1939) Pulse Rate: 80 (12/08 1939)  Labs: Recent Labs    10/03/18 0100  10/03/18 0748 10/03/18 1407 10/04/18 0227 10/04/18 1936 10/05/18 0634 10/05/18 2057  HGB  --    < > 10.5*  --  10.7*  --  9.7*  --   HCT  --   --  35.4*  --  34.1*  --  31.6*  --   PLT  --   --  101*  --  97*  --  98*  --   HEPARINUNFRC  --   --   --   --   --  <0.10* 0.24* 0.40  CREATININE  --   --  1.77*  --  1.29*  --  0.73  --   CKTOTAL 343*  --   --   --   --   --   --   --   TROPONINI 12.59*  --  12.79* 9.44*  --   --   --   --    < > = values in this interval not displayed.    Estimated Creatinine Clearance: 41.2 mL/min (by C-G formula based on SCr of 0.73 mg/dL).  Assessment:   84 y/o F here with N/V x 24 hours, found to have elevated troponin thought to be due to demand ischemia; heparin drip ordered but never started. She has severe multivessel CAD with no plan for cardiac interventions. Chest pain and ECG changes on 12/7 and Pharmacy consulted to begin IV heparin.  Heparin level therapeutic tonight after rate increase. Hg down to 9.7 today, plt low stable at 98. No bleed issues documented.  Goal of Therapy:  Heparin level 0.3-0.7 units/ml Monitor platelets by anticoagulation protocol: Yes   Plan:  Continue heparin drip at 1050 units/hr Confirmatory heparin level with AM labs Monitor daily heparin level and CBC, s/sx bleeding Heparin for 48hrs  to stop ~11am on 12/9.   Elicia Lamp, PharmD, BCPS Please check AMION for all [facility] contact numbers Clinical Pharmacist 10/05/2018 10:05 PM

## 2018-10-05 NOTE — Progress Notes (Signed)
[name] - Stepdown/ICU TEAM  Linda Kelly  IWP:[id number] DOB: 10-28-1928 DOA: 10/02/2018 PCP: Binnie Rail, MD    Brief Narrative:  84 y.o. female w/ a hx of pulmonary fibrosis on chronic home oxygen of 4-5 L, multivessel CAD followed by Dr. Kirk Ruths, and combined systolic and diastolic heart failure (last EF 40-45%) who presented with a day of nausea and vomiting w/ subjective fever, chills, and malaise.   In the ED she was noted to be febrile to 101.4 F w/ O2 sats difficult to obtain on 5 L nasal cannula oxygen. Labs revealed WBC 14.8, creatinine 2.31, AST 1923, ALT 1406, and lactic acid 8.5.  Urinalysis showed large leukocytes and many bacteria.  Chest x-ray showed chronic pulmonary fibrosis without any other acute abnormalities.   Subjective: The patient is lying in bed in mild respiratory distress with some audible wheezing.  She tells me she is very sleepy.  She is able to answer questions and is oriented.  She denies chest pain.  She denies nausea vomiting or abdominal pain.  Assessment & Plan:  Severe sepsis secondary to Gram neg rod UTI Await speciation and sensitivities on urine culture - continue empiric coverage with Rocephin  Hypotension Resolved  HTN Some of her home BP meds have been resumed - follow trend   Elevated troponin - Known severe un-revascularizable multivessel CAD Cardiology consulted at time of admit - known multivessel disease including chronic total occlusion of mid RCA, mid LAD, and 99% occlusion of the mid distal circumflex by cath in 07/2016 - not amenable to intervention - med tx only - w/ recurrent cp and new anterolateral T wave inversions 12/7 heparin was initiated for a planned 48hr course - cont ASA + Plavix + BB + Imdur   Recent Labs  Lab 10/03/18 0100 10/03/18 0748 10/03/18 1407  TROPONINI 12.59* 12.79* 9.44*    Prolonged QT interval initial QTc 534 - choose medications wisely - monitor trend - QTc 551 12/7  Acute on  chronic Hypoxic Respiratory failure - pulmonary fibrosis on chronic immunosuppressive therapy wean O2 support as necessary - presently requiring HFNC  Acute renal failure creatinine 1.07 in March, but presented with a creatinine of 2.31 - crt has now normalized w/ volume resuscitation   Recent Labs  Lab 10/02/18 1928 10/03/18 0748 10/04/18 0227 10/05/18 0634  CREATININE 2.31* 1.77* 1.29* 0.73    Elevated liver enzymes Shock liver in setting of sepsis - appears LFTs have peaked - follow trend - viral hepatitis panel negative   Recent Labs  Lab 10/02/18 1928 10/03/18 0748 10/04/18 0227  AST 1,823* 2,984* 2,349*  ALT 1,406* 1,998* 2,347*  ALKPHOS 78 64 76  BILITOT 1.3* 1.2 1.0  PROT 7.4 5.8* 6.1*  ALBUMIN 3.9 3.2* 3.2*    Chronic combined systolic and diastolic CHF EF 50-53% in 07/2016 - baseline weight appears to be 64-65kg - follow Is/Os and daily weights -some evidence of volume overload today with 1+ lower extremity edema -gently diuresis and follow renal function closely  [facility] Weights   10/03/18 2131 10/04/18 0420  Weight: 69.7 kg 70.4 kg    Normocytic/Macrocytic Anemia Hgb stable w/o evidence of gross blood loss - check anemia panel   Anxiety Continue Xanax as needed  Hyperlipedema Hold medical tx until LFTs normal   Aneurysmal aortic root dilation followed in outpatient setting by cardiology   Thrombocytopenia likely a consequence of a gram neg infection  DVT prophylaxis: IV heparin Code Status: DNR - NO  CODE Family Communication: No family present at time of exam Disposition Plan: SDU  Consultants:  Cardiology   Antimicrobials:  Cefepime 12/5 Rocephin 12/6 >  Objective: Blood pressure 140/80, pulse 83, temperature (!) 97.4 F (36.3 C), temperature source Oral, resp. rate 19, height 4' 9.99" (1.473 m), weight 70.4 kg, SpO2 100 %.  Intake/Output Summary (Last 24 hours) at 10/05/2018 0906 Last data filed at 10/05/2018 0645 Gross per 24 hour   Intake -  Output 1250 ml  Net -1250 ml   Filed Weights   10/03/18 2131 10/04/18 0420  Weight: 69.7 kg 70.4 kg    Examination: General: Continued mild respiratory distress requiring high flow nasal cannula and audible expiratory wheezing today Lungs: Fine diffuse crackles throughout with mild audible expiratory wheezing Cardiovascular: Regular rate and rhythm without murmur  Abdomen: NT/ND, soft, bs+, no mass  Extremities: 1+ B LE edema   CBC: Recent Labs  Lab 10/02/18 1928 10/03/18 0748 10/04/18 0227 10/05/18 0634  WBC 14.8* 20.6* 20.8* 15.5*  NEUTROABS 12.2*  --   --   --   HGB 11.6* 10.5* 10.7* 9.7*  HCT 37.4 35.4* 34.1* 31.6*  MCV 98.7 104.7* 98.0 100.3*  PLT 143* 101* 97* 98*   Basic Metabolic Panel: Recent Labs  Lab 10/02/18 1928 10/03/18 0748 10/04/18 0227 10/05/18 0634  NA 143 144 143 144  K 4.0 4.0 3.6 4.1  CL 98 111 108 109  CO2 22 21* 23 21*  GLUCOSE 143* 113* 129* 130*  BUN 26* 28* 30* 25*  CREATININE 2.31* 1.77* 1.29* 0.73  CALCIUM 8.8* 7.3* 8.3* 8.6*  MG  --   --  2.0 1.9  PHOS  --   --   --  2.8   GFR: Estimated Creatinine Clearance: 41.2 mL/min (by C-G formula based on SCr of 0.73 mg/dL).  Liver Function Tests: Recent Labs  Lab 10/02/18 1928 10/03/18 0748 10/04/18 0227  AST 1,823* 2,984* 2,349*  ALT 1,406* 1,998* 2,347*  ALKPHOS 78 64 73  BILITOT 1.3* 1.2 1.0  PROT 7.4 5.8* 6.1*  ALBUMIN 3.9 3.2* 3.2*   Recent Labs  Lab 10/02/18 2138  LIPASE 28    Cardiac Enzymes: Recent Labs  Lab 10/03/18 0100 10/03/18 0748 10/03/18 1407  CKTOTAL 343*  --   --   TROPONINI 12.59* 12.79* 9.44*    HbA1C: Hgb A1c MFr Bld  Date/Time Value Ref Range Status  08/09/2016 01:40 AM 5.2 4.8 - 5.6 % Final    Comment:    (NOTE)         Pre-diabetes: 5.7 - 6.4         Diabetes: >6.4         Glycemic control for adults with diabetes: <7.0   02/28/2016 10:46 AM 5.4 4.6 - 6.5 % Final    Comment:    Glycemic Control Guidelines for People with  Diabetes:Non Diabetic:  <6%Goal of Therapy: <7%Additional Action Suggested:  >8%      Recent Results (from the past 240 hour(s))  Blood culture (routine x 2)     Status: None (Preliminary result)   Collection Time: 10/02/18  7:16 PM  Result Value Ref Range Status   Specimen Description BLOOD RIGHT ANTECUBITAL  Final   Special Requests   Final    BOTTLES DRAWN AEROBIC AND ANAEROBIC Blood Culture adequate volume   Culture   Final    NO GROWTH 2 DAYS Performed at [facility], [place] [address] [place], [place] [address]    Report  Status PENDING  Incomplete  Blood culture (routine x 2)     Status: None (Preliminary result)   Collection Time: 10/02/18  7:33 PM  Result Value Ref Range Status   Specimen Description BLOOD RIGHT HAND  Final   Special Requests   Final    BOTTLES DRAWN AEROBIC AND ANAEROBIC Blood Culture results may not be optimal due to an inadequate volume of blood received in culture bottles   Culture   Final    NO GROWTH 2 DAYS Performed at [facility], [place] [address], [place], [place] [address]    Report Status PENDING  Incomplete  Urine culture     Status: Abnormal (Preliminary result)   Collection Time: 10/02/18  7:51 PM  Result Value Ref Range Status   Specimen Description URINE, RANDOM  Final   Special Requests   Final    NONE Performed at [facility], [place] [address] [place], [place] [address]    Culture >=100,000 COLONIES/mL GRAM NEGATIVE RODS (A)  Final   Report Status PENDING  Incomplete  Respiratory Panel by PCR     Status: None   Collection Time: 10/02/18 11:12 PM  Result Value Ref Range Status   Adenovirus NOT DETECTED NOT DETECTED Final   Coronavirus 229E NOT DETECTED NOT DETECTED Final   Coronavirus HKU1 NOT DETECTED NOT DETECTED Final   Coronavirus NL63 NOT DETECTED NOT DETECTED Final   Coronavirus OC43 NOT DETECTED NOT DETECTED Final   Metapneumovirus NOT DETECTED NOT DETECTED Final   Rhinovirus / Enterovirus NOT DETECTED NOT  DETECTED Final   Influenza A NOT DETECTED NOT DETECTED Final   Influenza B NOT DETECTED NOT DETECTED Final   Parainfluenza Virus 1 NOT DETECTED NOT DETECTED Final   Parainfluenza Virus 2 NOT DETECTED NOT DETECTED Final   Parainfluenza Virus 3 NOT DETECTED NOT DETECTED Final   Parainfluenza Virus 4 NOT DETECTED NOT DETECTED Final   Respiratory Syncytial Virus NOT DETECTED NOT DETECTED Final   Bordetella pertussis NOT DETECTED NOT DETECTED Final   Chlamydophila pneumoniae NOT DETECTED NOT DETECTED Final   Mycoplasma pneumoniae NOT DETECTED NOT DETECTED Final    Comment: Performed at [facility], [place] [address], [place],  [address]  MRSA PCR Screening     Status: None   Collection Time: 10/03/18  2:20 PM  Result Value Ref Range Status   MRSA by PCR NEGATIVE NEGATIVE Final    Comment:        The GeneXpert MRSA Assay (FDA approved for NASAL specimens only), is one component of a comprehensive MRSA colonization surveillance program. It is not intended to diagnose MRSA infection nor to guide or monitor treatment for MRSA infections. Performed at [facility], [place] [address], [place],  [address]      Scheduled Meds: . aspirin EC  81 mg Oral Daily  . clopidogrel  75 mg Oral Daily  . ipratropium-albuterol  3 mL Nebulization TID  . isosorbide mononitrate  30 mg Oral Daily  . metoprolol succinate  25 mg Oral Daily  . montelukast  10 mg Oral QHS  . predniSONE  40 mg Oral Q breakfast   Continuous Infusions: . sodium chloride 50 mL/hr at 10/04/18 1804  . cefTRIAXone (ROCEPHIN)  IV 1 g (10/04/18 1947)  . heparin 950 Units/hr (10/05/18 0645)     LOS: 3 days   Cherene Altes, MD [facility] Office  [phone] Pager - Text Page per Amion as per below:  On-Call/Text  Page:      [email or web address]  If 7PM-7AM, please contact night-coverage [email or web address] 10/05/2018, 9:06 AM

## 2018-10-05 NOTE — Progress Notes (Signed)
Progress Note  Patient Name: Linda Kelly Date of Encounter: 10/05/2018  Primary Cardiologist: Kirk Ruths, MD   Subjective   Feeling very tired.  No chest pain.  Breathing is improving.   Inpatient Medications    Scheduled Meds: . aspirin EC  81 mg Oral Daily  . clopidogrel  75 mg Oral Daily  . furosemide  20 mg Intravenous Q12H  . ipratropium-albuterol  3 mL Nebulization TID  . isosorbide mononitrate  30 mg Oral Daily  . metoprolol succinate  25 mg Oral Daily  . montelukast  10 mg Oral QHS  . predniSONE  40 mg Oral Q breakfast   Continuous Infusions: . sodium chloride 10 mL/hr at 10/05/18 0953  . cefTRIAXone (ROCEPHIN)  IV 1 g (10/04/18 1947)  . heparin 1,050 Units/hr (10/05/18 0954)   PRN Meds: acetaminophen, albuterol, ALPRAZolam, prochlorperazine   Vital Signs    Vitals:   10/05/18 0516 10/05/18 0546 10/05/18 0745 10/05/18 0855  BP: 128/77 (!) 145/93  140/80  Pulse: 78 83  83  Resp: _0 Temp:    (!) 97.4 F (36.3 C)  TempSrc:    Oral  SpO2: 100% 100% 98% 100%  Weight:      Height:        Intake/Output Summary (Last 24 hours) at 10/05/2018 1117 Last data filed at 10/05/2018 0645 Gross per 24 hour  Intake -  Output 1250 ml  Net -1250 ml   Filed Weights   10/03/18 2131 10/04/18 0420  Weight: 69.7 kg 70.4 kg    Telemetry    Sinus rhythm.  PVCs - Personally Reviewed  ECG    Sinus rhythm.  Rate 99 bpm.  PVCs.  Anterolateral TWI.  QTc 551 - Personally Reviewed  Physical Exam   VS:  BP 140/80 (BP Location: Left Arm)   Pulse 83   Temp (!) 97.4 F (36.3 C) (Oral)   Resp 19   Ht 4' 9.99" (1.473 m)   Wt 70.4 kg   SpO2 100%   BMI 32.45 kg/m  , BMI Body mass index is 32.45 kg/m. GENERAL:  Chronically ill-appearing.  Mild respiratory distress HEENT: Pupils equal round and reactive, fundi not visualized, oral mucosa unremarkable NECK:  No jugular venous distention, waveform within normal limits, carotid upstroke brisk and symmetric, no  bruits, no thyromegaly LUNGS:  Mild bibasilar crackles.  HEART:  RRR.  PMI not displaced or sustained,S1 and S2 within normal limits, no S3, no S4, no clicks, no rubs, no murmurs ABD:  Flat, positive bowel sounds normal in frequency in pitch, no bruits, no rebound, no guarding, no midline pulsatile mass, no hepatomegaly, no splenomegaly EXT:  2 plus pulses throughout, no edema, no cyanosis no clubbing SKIN:  No rashes no nodules NEURO:  Cranial nerves II through XII grossly intact, motor grossly intact throughout [facility]:  Cognitively intact, oriented to person place and time   Labs    Chemistry Recent Labs  Lab 10/02/18 1928 10/03/18 0748 10/04/18 0227 10/05/18 0634  NA 143 144 143 144  K 4.0 4.0 3.6 4.1  CL 98 111 108 109  CO2 22 21* 23 21*  GLUCOSE 143* 113* 129* 130*  BUN 26* 28* 30* 25*  CREATININE 2.31* 1.77* 1.29* 0.73  CALCIUM 8.8* 7.3* 8.3* 8.6*  PROT 7.4 5.8* 6.1*  --   ALBUMIN 3.9 3.2* 3.2*  --   AST 1,823* 2,984* 2,349*  --   ALT 1,406* 1,998* 2,347*  --   ALKPHOS  78 64 73  --   BILITOT 1.3* 1.2 1.0  --   GFRNONAA 18* 25* 37* >60  GFRAA 21* 29* 43* >60  ANIONGAP 23* _0 Hematology Recent Labs  Lab 10/03/18 0748 10/04/18 0227 10/05/18 0634  WBC 20.6* 20.8* 15.5*  RBC 3.38* 3.48* 3.15*  HGB 10.5* 10.7* 9.7*  HCT 35.4* 34.1* 31.6*  MCV 104.7* 98.0 100.3*  MCH 31.1 30.7 30.8  MCHC 29.7* 31.4 30.7  RDW 15.2 15.1 15.0  PLT 101* 97* 98*    Cardiac Enzymes Recent Labs  Lab 10/03/18 0100 10/03/18 0748 10/03/18 1407  TROPONINI 12.59* 12.79* 9.44*   No results for input(s): TROPIPOC in the last 168 hours.   BNPNo results for input(s): BNP, PROBNP in the last 168 hours.   DDimer No results for input(s): DDIMER in the last 168 hours.   Radiology    No results found.  Cardiac Studies   08/10/2016 [facility] personally reviewed  Complicated procedure that demonstrated high-grade obstruction in the right iliac arterypreventing the procedure  from being completed and requiring crossover to the left femoral artery.  Inability to advance the pulmonary artery catheter into the pulmonary artery or capillary wedge position. RV systolic pressure is mildly to moderately elevated.  Severe multivessel coronary disease with chronic total occlusion of the mid RCAcollateralized by right to right and left-to-right sources.  Total occlusion of the mid LAD beyond a large first diagonal. Left left collaterals are noted.  99% stenosis in the mid to distal native circumflex beyond a very tortuous segment. PCI was not attempted. Distal territory is relatively small.  Inferior wall severe hypokinesis involving the basal to mid segment. Anterior and apical mild hypokinesis is noted. Ejection fraction is estimated to be 35-45%.Presentation was that ofacute on chronic combined systolic and diastolic heart failure.  08/10/2016 TTE  LVEF=40-45% Moderate MAC mild-moderate MR Rv normal Trivial TR10/13/2017 [facility] personally reviewed  Complicated procedure that demonstrated high-grade obstruction in the right iliac arterypreventing the procedure from being completed and requiring crossover to the left femoral artery.  Inability to advance the pulmonary artery catheter into the pulmonary artery or capillary wedge position. RV systolic pressure is mildly to moderately elevated.  Severe multivessel coronary disease with chronic total occlusion of the mid RCAcollateralized by right to right and left-to-right sources.  Total occlusion of the mid LAD beyond a large first diagonal. Left left collaterals are noted.  99% stenosis in the mid to distal native circumflex beyond a very tortuous segment. PCI was not attempted. Distal territory is relatively small.  Inferior wall severe hypokinesis involving the basal to mid segment. Anterior and apical mild hypokinesis is noted. Ejection fraction is estimated to be 35-45%.Presentation was that ofacute on chronic  combined systolic and diastolic heart failure.  Patient Profile     84 y.o. female with severe multivessel CAD not amenable to revascularization, chronic systolic and diastolic heart failure (LVEF 40 to 45%), ischemic cardiomyopathy, pulmonary fibrosis here with severe sepsis secondary to a urinary tract infection.  Cardiology was consulted for elevated troponin.  Assessment & Plan    # Severe sepsis from a urinary source: BP better.  Her home metoprolol was increased from 84m to 564mand she remains stable.    # Elevated troponin: # Severe multivessel CAD: Chest pain resolved after resuming metprolol, Imdur and heparin.  Troponin peaked at 12.8.  She was initially not started on heparin as her elevated troponin was thought to be due to demand  ischemia.  However, given her chest pain and dynamic EKG changes she was started heparin 12/7 with plans for 48 hours of therapy.  She is not a candidate for any further cardiac interventions.  Continue clopidogrel and aspirin.    # Shock liver: Slowly improving.  Holding home simvastatin and ezetimibe.  Resume when able.   # Acute on chronic hypoxic respiratory failure: Weaned down from 15L to 8L high flow.  Mild crackles on exam.  Agree with IV lasix.   # Acute renal failure: Resolved.       For questions or updates, please contact [place] Please consult [email or web address] for contact info under        Signed, Skeet Latch, MD  10/05/2018, 11:17 AM

## 2018-10-06 DIAGNOSIS — I5021 Acute systolic (congestive) heart failure: Secondary | ICD-10-CM

## 2018-10-06 LAB — CBC
HCT: 33.3 % — ABNORMAL LOW (ref 36.0–46.0)
HEMOGLOBIN: 10.1 g/dL — AB (ref 12.0–15.0)
MCH: 30.2 pg (ref 26.0–34.0)
MCHC: 30.3 g/dL (ref 30.0–36.0)
MCV: 99.7 fL (ref 80.0–100.0)
Platelets: 133 10*3/uL — ABNORMAL LOW (ref 150–400)
RBC: 3.34 MIL/uL — ABNORMAL LOW (ref 3.87–5.11)
RDW: 14.8 % (ref 11.5–15.5)
WBC: 18.4 10*3/uL — ABNORMAL HIGH (ref 4.0–10.5)
nRBC: 0.3 % — ABNORMAL HIGH (ref 0.0–0.2)

## 2018-10-06 LAB — RETICULOCYTES
Immature Retic Fract: 26.1 % — ABNORMAL HIGH (ref 2.3–15.9)
RBC.: 3.34 MIL/uL — ABNORMAL LOW (ref 3.87–5.11)
Retic Count, Absolute: 117.6 10*3/uL (ref 19.0–186.0)
Retic Ct Pct: 3.5 % — ABNORMAL HIGH (ref 0.4–3.1)

## 2018-10-06 LAB — FERRITIN: FERRITIN: 228 ng/mL (ref 11–307)

## 2018-10-06 LAB — COMPREHENSIVE METABOLIC PANEL
ALT: 882 U/L — ABNORMAL HIGH (ref 0–44)
AST: 148 U/L — AB (ref 15–41)
Albumin: 2.8 g/dL — ABNORMAL LOW (ref 3.5–5.0)
Alkaline Phosphatase: 81 U/L (ref 38–126)
Anion gap: 10 (ref 5–15)
BUN: 29 mg/dL — ABNORMAL HIGH (ref 8–23)
CHLORIDE: 101 mmol/L (ref 98–111)
CO2: 31 mmol/L (ref 22–32)
Calcium: 8.6 mg/dL — ABNORMAL LOW (ref 8.9–10.3)
Creatinine, Ser: 0.81 mg/dL (ref 0.44–1.00)
GFR calc Af Amer: >60 mL/min (ref >60)
GFR calc non Af Amer: >60 mL/min (ref >60)
Glucose, Bld: 109 mg/dL — ABNORMAL HIGH (ref 70–99)
Potassium: 3.9 mmol/L (ref 3.5–5.1)
Sodium: 142 mmol/L (ref 135–145)
Total Bilirubin: 0.9 mg/dL (ref 0.3–1.2)
Total Protein: 5.5 g/dL — ABNORMAL LOW (ref 6.5–8.1)

## 2018-10-06 LAB — IRON AND TIBC
Iron: 57 ug/dL (ref 28–170)
Saturation Ratios: 23 % (ref 10.4–31.8)
TIBC: 245 ug/dL — ABNORMAL LOW (ref 250–450)
UIBC: 188 ug/dL

## 2018-10-06 LAB — VITAMIN B12: Vitamin B-12: 630 pg/mL (ref 180–914)

## 2018-10-06 LAB — FOLATE: Folate: 7 ng/mL (ref >5.9)

## 2018-10-06 LAB — HEPARIN LEVEL (UNFRACTIONATED): HEPARIN UNFRACTIONATED: 0.49 [IU]/mL (ref 0.30–0.70)

## 2018-10-06 MED ORDER — CEPHALEXIN 250 MG PO CAPS
250.0000 mg | ORAL_CAPSULE | Freq: Three times a day (TID) | ORAL | Status: AC
  Administered 2018-10-06 – 2018-10-09 (×11): 250 mg via ORAL
  Filled 2018-10-06 (×11): qty 1

## 2018-10-06 MED ORDER — ORAL CARE MOUTH RINSE
15.0000 mL | Freq: Two times a day (BID) | OROMUCOSAL | Status: DC
  Administered 2018-10-06 – 2018-10-10 (×8): 15 mL via OROMUCOSAL

## 2018-10-06 MED ORDER — FUROSEMIDE 40 MG PO TABS
40.0000 mg | ORAL_TABLET | Freq: Two times a day (BID) | ORAL | Status: DC
  Administered 2018-10-06 – 2018-10-11 (×10): 40 mg via ORAL
  Filled 2018-10-06 (×10): qty 1

## 2018-10-06 MED ORDER — PREDNISONE 20 MG PO TABS
20.0000 mg | ORAL_TABLET | Freq: Every day | ORAL | Status: DC
  Administered 2018-10-07 – 2018-10-08 (×2): 20 mg via ORAL
  Filled 2018-10-06 (×3): qty 1

## 2018-10-06 NOTE — Progress Notes (Signed)
ANTICOAGULATION CONSULT NOTE - Follow Up Consult  Pharmacy Consult for Heparin  Indication: chest pain/ACS  No Known Allergies  Patient Measurements: Height: 4' 9.99" (147.3 cm) Weight: 154 lb 5.2 oz (70 kg) IBW/kg (Calculated) : 40.88 Heparin Dosing Weight: 57 kg  Vital Signs: Temp: 97.5 F (36.4 C) (12/09 0447) Temp Source: Oral (12/09 0447) BP: 147/89 (12/09 0447)  Labs: Recent Labs    10/03/18 0748 10/03/18 1407 10/04/18 0227  10/05/18 0634 10/05/18 2057 10/06/18 0633  HGB 10.5*  --  10.7*  --  9.7*  --  10.1*  HCT 35.4*  --  34.1*  --  31.6*  --  33.3*  PLT 101*  --  97*  --  98*  --  133*  HEPARINUNFRC  --   --   --    < > 0.24* 0.40 0.49  CREATININE 1.77*  --  1.29*  --  0.73  --  0.81  TROPONINI 12.79* 9.44*  --   --   --   --   --    < > = values in this interval not displayed.    Estimated Creatinine Clearance: 40.6 mL/min (by C-G formula based on SCr of 0.81 mg/dL).  Assessment:   84 y/o F here with N/V x 24 hours, found to have elevated troponin thought to be due to demand ischemia; heparin drip ordered but never started. She has severe multivessel CAD with no plan for cardiac interventions. Chest pain and ECG changes on 12/7 and Pharmacy consulted to begin IV heparin.  Heparin level therapeutic this AM. y, plt low stable at 133. No bleed issues documented.  Goal of Therapy:  Heparin level 0.3-0.7 units/ml Monitor platelets by anticoagulation protocol: Yes   Plan:  Continue heparin drip at 1050 units/hr Confirmatory heparin level with AM labs Monitor daily heparin level and CBC, s/sx bleeding Heparin for 48hrs  to stop ~11am on 12/9.   Hermilo Dutter A. Levada Dy, PharmD, [place] Pager: [phone] Please utilize Amion for appropriate phone number to reach the unit pharmacist ([place])   10/06/2018 7:44 AM

## 2018-10-06 NOTE — Care Management Important Message (Signed)
Important Message  Patient Details  Name: Linda Kelly MRN: [id number] Date of Birth: 25-Feb-1928   Medicare Important Message Given:  Yes    Sircharles Holzheimer 10/06/2018, 4:08 PM

## 2018-10-06 NOTE — Progress Notes (Signed)
Abx adjustments:  Pt has been on ceftriaxone for her UTI. Culture came back with pan sens e.coli. D/w Dr Thereasa Solo, we will change abx to Keflex x 3 more days to complete 7d.  Keflex 250mg  PO TID x 3d  Onnie Boer, PharmD, BCIDP, AAHIVP, CPP Infectious Disease Pharmacist 10/06/2018 11:32 AM

## 2018-10-06 NOTE — Evaluation (Signed)
Occupational Therapy Evaluation Patient Details Name: Linda Kelly MRN: [id number] DOB: 1927/12/15 Today's Date: 10/06/2018    History of Present Illness 84 y.o. female w/ a hx of pulmonary fibrosis on chronic home oxygen of 4-5 L, multivessel CAD followed by Dr.Brian Crenshaw, and combined systolic and diastolic heart failure (last EF 40-45%) who presented with a day of nausea and vomiting w/ subjective fever, chills, and malaise.     Clinical Impression   PTA, pt was living with alone and daughters provide 24/7 support and assist with ADLs and IADLs. Currently, pt requires Min A for UB ADLs, Mod A for LB ADLs, and Min Guard A for functional mobility using RW. Pt presenting with decreased strength, balance, and activity tolerance. Pt presenting with slower processing and requires increased time throughout session. Pt would benefit from further acute OT to facilitate safe dc. Pending support, recommend dc to home with [place] for further OT to optimize safety, independence with ADLs, and return to PLOF.       Follow Up Recommendations  Home health OT;Supervision/Assistance - 24 hour    Equipment Recommendations  None recommended by OT    Recommendations for Other Services PT consult     Precautions / Restrictions Precautions Precautions: Fall Precaution Comments: telemetry Restrictions Weight Bearing Restrictions: No      Mobility Bed Mobility Overal bed mobility: Needs Assistance Bed Mobility: Supine to Sit     Supine to sit: Min assist;HOB elevated     General bed mobility comments: Min A for trunk elevation. Increased time and HOB elevated  Transfers Overall transfer level: Needs assistance Equipment used: Rolling walker (2 wheeled);1 person hand held assist Transfers: Sit to/from Stand Sit to Stand: Min guard         General transfer comment: Min Guard A for safety and cues for hand placement    Balance Overall balance assessment: Needs  assistance Sitting-balance support: Feet supported Sitting balance-Leahy Scale: Fair     Standing balance support: Bilateral upper extremity supported;During functional activity Standing balance-Leahy Scale: Poor                             ADL either performed or assessed with clinical judgement   ADL Overall ADL's : Needs assistance/impaired Eating/Feeding: Minimal assistance;Sitting Eating/Feeding Details (indicate cue type and reason): Min A for opening containers.  Grooming: Min guard;Standing Grooming Details (indicate cue type and reason): Min Guard A for safety Upper Body Bathing: Minimal assistance;Sitting   Lower Body Bathing: Moderate assistance;Sit to/from stand   Upper Body Dressing : Minimal assistance;Sitting   Lower Body Dressing: Maximal assistance;Sit to/from stand   Toilet Transfer: Min guard;Ambulation;RW(simulated to recliner) [employer] forces technical officer Details (indicate cue type and reason): Min Guard A for safety         Functional mobility during ADLs: Min guard;Rolling walker General ADL Comments: Pt presenting near baseline function and requiring Min A for UB ADLs, Mod A for LB ADLs, and Min Guard A for functional mobiltiy with RW. Pt SpO2 difficult to get good reading, but with good wave form, pt is at 90% on 5L.     Vision         Perception     Praxis      Pertinent Vitals/Pain Pain Assessment: No/denies pain     Hand Dominance Right   Extremity/Trunk Assessment Upper Extremity Assessment Upper Extremity Assessment: Generalized weakness   Lower Extremity Assessment Lower Extremity Assessment: Defer to PT evaluation  Cervical / Trunk Assessment Cervical / Trunk Assessment: Kyphotic   Communication Communication Communication: No difficulties   Cognition Arousal/Alertness: Awake/alert Behavior During Therapy: WFL for tasks assessed/performed Overall Cognitive Status: Within Functional Limits for tasks assessed                                  General Comments: using purwick and on O2 via nasal cannula   General Comments  SpO2 90% on 5L with good wave form.    Exercises     Shoulder Instructions      Home Living Family/patient expects to be discharged to:: Private residence Living Arrangements: Alone;Children Available Help at Discharge: Family;Available 24 hours/day(Lives alone but daughters stay 24/7) Type of Home: House Home Access: Stairs to enter [employer] of Steps: 1 Entrance Stairs-Rails: Right Home Layout: One level     Bathroom Shower/Tub: Tub/shower unit;Other (comment)(modified with a door)   Bathroom Toilet: Standard     Home Equipment: Walker - 4 wheels;Cane - single point;Shower seat   Additional Comments: 5 L home O2      Prior Functioning/Environment Level of Independence: Needs assistance  Gait / Transfers Assistance Needed: Uses a RW for functional mobility ADL's / Homemaking Assistance Needed: Pt requiring assistance for dressing/bathing and family performs all IADLs            OT Problem List: Decreased strength;Decreased range of motion;Decreased activity tolerance;Impaired balance (sitting and/or standing);Decreased knowledge of use of DME or AE;Decreased knowledge of precautions;Cardiopulmonary status limiting activity      OT Treatment/Interventions: Self-care/ADL training;Therapeutic exercise;Energy conservation;DME and/or AE instruction;Therapeutic activities;Patient/family education    OT Goals(Current goals can be found in the care plan section) Acute Rehab OT Goals Patient Stated Goal: "Go home" OT Goal Formulation: With patient Time For Goal Achievement: 10/20/18 Potential to Achieve Goals: Good ADL Goals Pt Will Perform Grooming: with set-up;with supervision;standing Pt Will Perform Lower Body Dressing: with min assist;with caregiver independent in assisting;sit to/from stand Pt Will Transfer to Toilet: with set-up;with  supervision;regular height toilet;ambulating Pt Will Perform Toileting - Clothing Manipulation and hygiene: with set-up;with supervision;sit to/from stand  OT Frequency: Min 2X/week   Barriers to D/C:            Co-evaluation              AM-PAC OT "6 Clicks" Daily Activity     Outcome Measure Help from another person eating meals?: A Little Help from another person taking care of personal grooming?: A Little Help from another person toileting, which includes using toliet, bedpan, or urinal?: A Little Help from another person bathing (including washing, rinsing, drying)?: A Lot Help from another person to put on and taking off regular upper body clothing?: A Little Help from another person to put on and taking off regular lower body clothing?: A Lot 6 Click Score: 16   End of Session Equipment Utilized During Treatment: Rolling walker;Oxygen(5L) Nurse Communication: Mobility status(spO2)  Activity Tolerance: Patient tolerated treatment well Patient left: in chair;with call bell/phone within reach;with chair alarm set  OT Visit Diagnosis: Unsteadiness on feet (R26.81);Other abnormalities of gait and mobility (R26.89);Muscle weakness (generalized) (M62.81)                Time: 3154-0086 OT Time Calculation (min): 26 min Charges:  OT General Charges $OT Visit: 1 Visit OT Evaluation $OT Eval Moderate Complexity: 1 Mod OT Treatments $Self Care/Home Management : 8-22 mins  Michela Herst  [address] MSOT, OTR/L Acute Rehab Pager: [phone] Office: [place] 10/06/2018, 8:51 AM

## 2018-10-06 NOTE — Progress Notes (Signed)
[employer] TEAM 1 - Stepdown/ICU TEAM  Linda Kelly  PTW:[id number] DOB: 05-18-1928 DOA: 10/02/2018 PCP: Binnie Rail, MD    Brief Narrative:  84 y.o. female w/ a hx of pulmonary fibrosis on chronic home oxygen of 4-5 L, multivessel CAD followed by Dr. Kirk Ruths, and combined systolic and diastolic heart failure (last EF 40-45%) who presented with a day of nausea and vomiting w/ subjective fever, chills, and malaise.   In the ED she was noted to be febrile to 101.4 F w/ O2 sats difficult to obtain on 5 L nasal cannula oxygen. Labs revealed WBC 14.8, creatinine 2.31, AST 1923, ALT 1406, and lactic acid 8.5.  Urinalysis showed large leukocytes and many bacteria.  Chest x-ray showed chronic pulmonary fibrosis without any other acute abnormalities.   Subjective: The patient is sitting up in a bedside chair.  She appears somewhat tired but is alert and conversant.  She denies chest pain nausea or vomiting.  She tells me she feels very weak in general.  She admits to having a very poor appetite.  Assessment & Plan:  Severe sepsis secondary to pan-sensitive E coli UTI Clinically responding to abx tx, albeit slowly - narrow abx spectrum today as per discussion w/ Rx  Hypotension Resolved w/ volume resuscitation and tx of infection  HTN Some of her home BP meds have been resumed - BP well controlled   Elevated troponin - Known severe un-revascularizable multivessel CAD Cardiology has evaluated - known multivessel disease including chronic total occlusion of mid RCA, mid LAD, and 99% occlusion of the mid distal circumflex by cath in 07/2016 - not amenable to intervention - med tx only - w/ recurrent cp and new anterolateral T wave inversions 12/7 heparin was initiated for a planned 48hr course - cont ASA + Plavix + BB + Imdur - stop heparin today - no chest pain at this time   Recent Labs  Lab 10/03/18 0100 10/03/18 0748 10/03/18 1407  TROPONINI 12.59* 12.79* 9.44*    Prolonged QT  interval initial QTc 534 - choose medications wisely - monitor trend - QTc 551 12/7  Acute on chronic Hypoxic Respiratory failure - pulmonary fibrosis on chronic immunosuppressive therapy wean O2 support as necessary - presently requiring HFNC but stable   Acute renal failure creatinine 1.07 in March, but presented with a creatinine of 2.31 - crt has now normalized w/ volume resuscitation   Recent Labs  Lab 10/02/18 1928 10/03/18 0748 10/04/18 0227 10/05/18 0634 10/06/18 0633  CREATININE 2.31* 1.77* 1.29* 0.73 0.81    Elevated liver enzymes Shock liver in setting of sepsis - appears LFTs have peaked - follow trend - viral hepatitis panel negative   Recent Labs  Lab 10/02/18 1928 10/03/18 0748 10/04/18 0227 10/06/18 0633  AST 1,823* 2,984* 2,349* 148*  ALT 1,406* 1,998* 2,347* 882*  ALKPHOS 78 64 73 81  BILITOT 1.3* 1.2 1.0 0.9  PROT 7.4 5.8* 6.1* 5.5*  ALBUMIN 3.9 3.2* 3.2* 2.8*    Chronic combined systolic and diastolic CHF EF 70-01% in 07/2016 - baseline weight appears to be 64-65kg - follow Is/Os and daily weights - doing well w/ resumption of diuretic   Filed Weights   10/03/18 2131 10/04/18 0420 10/06/18 0500  Weight: 69.7 kg 70.4 kg 70 kg    Normocytic/Macrocytic Anemia Hgb stable w/o evidence of gross blood loss - anemia panel most c/w ACD/poor nutrition   Anxiety Continue Xanax as needed  Hyperlipedema Hold medical tx until LFTs normal  Aneurysmal aortic root dilation followed in outpatient setting by Cardiology   Thrombocytopenia likely a consequence of a gram neg infection - now improving   DVT prophylaxis: IV heparin Code Status: DNR - NO CODE Family Communication: No family present at time of exam Disposition Plan: SDU  Consultants:  Cardiology   Antimicrobials:  Cefepime 12/5 Rocephin 12/6 > 12/8 Keflex 12/9 >  Objective: Blood pressure 126/77, pulse 78, temperature 98 F (36.7 C), temperature source Oral, resp. rate 18,  height 4' 9.99" (1.473 m), weight 70 kg, SpO2 90 %.  Intake/Output Summary (Last 24 hours) at 10/06/2018 1344 Last data filed at 10/06/2018 1212 Gross per 24 hour  Intake 3173.28 ml  Output 2500 ml  Net 673.28 ml   Filed Weights   10/03/18 2131 10/04/18 0420 10/06/18 0500  Weight: 69.7 kg 70.4 kg 70 kg    Examination: General: No acute resp distress today  Lungs: poor air movement B bases - no wheezing  Cardiovascular: Regular rate and rhythm without murmur  Abdomen: NT/ND, soft, bs+, no mass  Extremities: 1+ B LE edema w/o change   CBC: Recent Labs  Lab 10/02/18 1928 10/03/18 0748 10/04/18 0227 10/05/18 0634 10/06/18 0633  WBC 14.8* 20.6* 20.8* 15.5* 18.4*  NEUTROABS 12.2*  --   --   --   --   HGB 11.6* 10.5* 10.7* 9.7* 10.1*  HCT 37.4 35.4* 34.1* 31.6* 33.3*  MCV 98.7 104.7* 98.0 100.3* 99.7  PLT 143* 101* 97* 98* 798*   Basic Metabolic Panel: Recent Labs  Lab 10/02/18 1928 10/03/18 0748 10/04/18 0227 10/05/18 0634 10/06/18 0633  NA 143 144 143 144 142  K 4.0 4.0 3.6 4.1 3.9  CL 98 111 108 109 101  CO2 22 21* 23 21* 31  GLUCOSE 143* 113* 129* 130* 109*  BUN 26* 28* 30* 25* 29*  CREATININE 2.31* 1.77* 1.29* 0.73 0.81  CALCIUM 8.8* 7.3* 8.3* 8.6* 8.6*  MG  --   --  2.0 1.9  --   PHOS  --   --   --  2.8  --    GFR: Estimated Creatinine Clearance: 40.6 mL/min (by C-G formula based on SCr of 0.81 mg/dL).  Liver Function Tests: Recent Labs  Lab 10/02/18 1928 10/03/18 0748 10/04/18 0227 10/06/18 0633  AST 1,823* 2,984* 2,349* 148*  ALT 1,406* 1,998* 2,347* 882*  ALKPHOS 78 64 73 81  BILITOT 1.3* 1.2 1.0 0.9  PROT 7.4 5.8* 6.1* 5.5*  ALBUMIN 3.9 3.2* 3.2* 2.8*   Recent Labs  Lab 10/02/18 2138  LIPASE 28    Cardiac Enzymes: Recent Labs  Lab 10/03/18 0100 10/03/18 0748 10/03/18 1407  CKTOTAL 343*  --   --   TROPONINI 12.59* 12.79* 9.44*    HbA1C: Hgb A1c MFr Bld  Date/Time Value Ref Range Status  08/09/2016 01:40 AM 5.2 4.8 - 5.6 %  Final    Comment:    (NOTE)         Pre-diabetes: 5.7 - 6.4         Diabetes: >6.4         Glycemic control for adults with diabetes: <7.0   02/28/2016 10:46 AM 5.4 4.6 - 6.5 % Final    Comment:    Glycemic Control Guidelines for People with Diabetes:Non Diabetic:  <6%Goal of Therapy: <7%Additional Action Suggested:  >8%      Recent Results (from the past 240 hour(s))  Blood culture (routine x 2)     Status: None (Preliminary result)  Collection Time: 10/02/18  7:16 PM  Result Value Ref Range Status   Specimen Description BLOOD RIGHT ANTECUBITAL  Final   Special Requests   Final    BOTTLES DRAWN AEROBIC AND ANAEROBIC Blood Culture adequate volume   Culture   Final    NO GROWTH 3 DAYS Performed at [facility], [address], [place], [place] [address]    Report Status PENDING  Incomplete  Blood culture (routine x 2)     Status: None (Preliminary result)   Collection Time: 10/02/18  7:33 PM  Result Value Ref Range Status   Specimen Description BLOOD RIGHT HAND  Final   Special Requests   Final    BOTTLES DRAWN AEROBIC AND ANAEROBIC Blood Culture results may not be optimal due to an inadequate volume of blood received in culture bottles   Culture   Final    NO GROWTH 3 DAYS Performed at [facility], [place] [address], [place], [place] [address]    Report Status PENDING  Incomplete  Urine culture     Status: Abnormal   Collection Time: 10/02/18  7:51 PM  Result Value Ref Range Status   Specimen Description URINE, RANDOM  Final   Special Requests   Final    NONE Performed at [facility], [place] [address] [place], [place] [address]    Culture >=100,000 COLONIES/mL ESCHERICHIA COLI (A)  Final   Report Status 10/05/2018 FINAL  Final   Organism ID, Bacteria ESCHERICHIA COLI (A)  Final      Susceptibility   Escherichia coli - MIC*    AMPICILLIN <=2 SENSITIVE Sensitive     CEFAZOLIN <=4 SENSITIVE Sensitive     CEFTRIAXONE <=1 SENSITIVE Sensitive      CIPROFLOXACIN <=0.25 SENSITIVE Sensitive     GENTAMICIN <=1 SENSITIVE Sensitive     IMIPENEM <=0.25 SENSITIVE Sensitive     NITROFURANTOIN <=16 SENSITIVE Sensitive     TRIMETH/SULFA <=20 SENSITIVE Sensitive     AMPICILLIN/SULBACTAM <=2 SENSITIVE Sensitive     PIP/TAZO <=4 SENSITIVE Sensitive     Extended ESBL NEGATIVE Sensitive     * >=100,000 COLONIES/mL ESCHERICHIA COLI  Respiratory Panel by PCR     Status: None   Collection Time: 10/02/18 11:12 PM  Result Value Ref Range Status   Adenovirus NOT DETECTED NOT DETECTED Final   Coronavirus 229E NOT DETECTED NOT DETECTED Final   Coronavirus HKU1 NOT DETECTED NOT DETECTED Final   Coronavirus NL63 NOT DETECTED NOT DETECTED Final   Coronavirus OC43 NOT DETECTED NOT DETECTED Final   Metapneumovirus NOT DETECTED NOT DETECTED Final   Rhinovirus / Enterovirus NOT DETECTED NOT DETECTED Final   Influenza A NOT DETECTED NOT DETECTED Final   Influenza B NOT DETECTED NOT DETECTED Final   Parainfluenza Virus 1 NOT DETECTED NOT DETECTED Final   Parainfluenza Virus 2 NOT DETECTED NOT DETECTED Final   Parainfluenza Virus 3 NOT DETECTED NOT DETECTED Final   Parainfluenza Virus 4 NOT DETECTED NOT DETECTED Final   Respiratory Syncytial Virus NOT DETECTED NOT DETECTED Final   Bordetella pertussis NOT DETECTED NOT DETECTED Final   Chlamydophila pneumoniae NOT DETECTED NOT DETECTED Final   Mycoplasma pneumoniae NOT DETECTED NOT DETECTED Final    Comment: Performed at [facility], [place]. [address] [place],  [address]  MRSA PCR Screening     Status: None   Collection Time: 10/03/18  2:20 PM  Result Value Ref Range Status   MRSA by PCR NEGATIVE NEGATIVE  Final    Comment:        The GeneXpert MRSA Assay (FDA approved for NASAL specimens only), is one component of a comprehensive MRSA colonization surveillance program. It is not intended to diagnose MRSA infection nor to guide or monitor treatment for MRSA infections. Performed at [facility], [place] [address] [place], [place] [address]      Scheduled Meds: . aspirin EC  81 mg Oral Daily  . cephALEXin  250 mg Oral Q8H  . clopidogrel  75 mg Oral Daily  . furosemide  20 mg Intravenous Q12H  . ipratropium-albuterol  3 mL Nebulization TID  . isosorbide mononitrate  30 mg Oral Daily  . metoprolol succinate  25 mg Oral Daily  . montelukast  10 mg Oral QHS  . predniSONE  40 mg Oral Q breakfast   Continuous Infusions: . sodium chloride 10 mL/hr at 10/05/18 0953     LOS: 4 days   Cherene Altes, MD [facility] Office  [phone] Pager - Text Page per Shea Evans as per below:  On-Call/Text Page:      [email or web address]  If 7PM-7AM, please contact night-coverage [email or web address] 10/06/2018, 1:44 PM

## 2018-10-06 NOTE — Progress Notes (Signed)
Progress Note  Patient Name: Linda Kelly Date of Encounter: 10/06/2018  Primary Cardiologist: Kirk Ruths, MD   Subjective   No chest pain this am.   Inpatient Medications    Scheduled Meds: . aspirin EC  81 mg Oral Daily  . clopidogrel  75 mg Oral Daily  . furosemide  20 mg Intravenous Q12H  . ipratropium-albuterol  3 mL Nebulization TID  . isosorbide mononitrate  30 mg Oral Daily  . metoprolol succinate  25 mg Oral Daily  . montelukast  10 mg Oral QHS  . predniSONE  40 mg Oral Q breakfast   Continuous Infusions: . sodium chloride 10 mL/hr at 10/05/18 0953  . cefTRIAXone (ROCEPHIN)  IV Stopped (10/05/18 2030)  . heparin 1,050 Units/hr (10/06/18 0731)   PRN Meds: acetaminophen, albuterol, ALPRAZolam, prochlorperazine   Vital Signs    Vitals:   10/06/18 0102 10/06/18 0447 10/06/18 0500 10/06/18 0756  BP:  (!) 147/89    Pulse:      Resp:  18    Temp: (!) 96.7 F (35.9 C) (!) 97.5 F (36.4 C)    TempSrc: Oral Oral    SpO2:    100%  Weight:   70 kg   Height:        Intake/Output Summary (Last 24 hours) at 10/06/2018 0827 Last data filed at 10/06/2018 0500 Gross per 24 hour  Intake 3173.28 ml  Output 2900 ml  Net 273.28 ml   Filed Weights   10/03/18 2131 10/04/18 0420 10/06/18 0500  Weight: 69.7 kg 70.4 kg 70 kg    Telemetry    sinus- Personally Reviewed  ECG    No am EKG  Physical Exam   VS:  BP (!) 147/89   Pulse 80   Temp (!) 97.5 F (36.4 C) (Oral)   Resp 18   Ht 4' 9.99" (1.473 m)   Wt 70 kg   SpO2 100%   BMI 32.26 kg/m  , BMI Body mass index is 32.26 kg/m.    General: Well developed, well nourished, NAD  HEENT: OP clear, mucus membranes moist  SKIN: warm, dry. No rashes. Neuro: No focal deficits  Musculoskeletal: Muscle strength 5/5 all ext  Psychiatric: Mood and affect normal  Neck: No JVD, no carotid bruits, no thyromegaly, no lymphadenopathy.  Lungs:diffuse crackles bilaterally.  Cardiovascular: Regular rate and rhythm.  No murmurs, gallops or rubs. Abdomen:Soft. Bowel sounds present. Non-tender.  Extremities: No lower extremity edema. Pulses are 2 + in the bilateral DP/PT.    Labs    Chemistry Recent Labs  Lab 10/03/18 0748 10/04/18 0227 10/05/18 0634 10/06/18 0633  NA 144 143 144 142  K 4.0 3.6 4.1 3.9  CL 111 108 109 101  CO2 21* 23 21* 31  GLUCOSE 113* 129* 130* 109*  BUN 28* 30* 25* 29*  CREATININE 1.77* 1.29* 0.73 0.81  CALCIUM 7.3* 8.3* 8.6* 8.6*  PROT 5.8* 6.1*  --  5.5*  ALBUMIN 3.2* 3.2*  --  2.8*  AST 2,984* 2,349*  --  148*  ALT 1,998* 2,347*  --  882*  ALKPHOS 64 73  --  81  BILITOT 1.2 1.0  --  0.9  GFRNONAA 25* 37* >60 >60  GFRAA 29* 43* >60 >60  ANIONGAP 12 12 14 10      Hematology Recent Labs  Lab 10/04/18 0227 10/05/18 0634 10/06/18 0633  WBC 20.8* 15.5* 18.4*  RBC 3.48* 3.15* 3.34*  3.34*  HGB 10.7* 9.7* 10.1*  HCT 34.1* 31.6*  33.3*  MCV 98.0 100.3* 99.7  MCH 30.7 30.8 30.2  MCHC 31.4 30.7 30.3  RDW 15.1 15.0 14.8  PLT 97* 98* 133*    Cardiac Enzymes Recent Labs  Lab 10/03/18 0100 10/03/18 0748 10/03/18 1407  TROPONINI 12.59* 12.79* 9.44*   No results for input(s): TROPIPOC in the last 168 hours.   BNPNo results for input(s): BNP, PROBNP in the last 168 hours.   DDimer No results for input(s): DDIMER in the last 168 hours.   Radiology    No results found.  Cardiac Studies   61/60/7371 LHC   Complicated procedure that demonstrated high-grade obstruction in the right iliac arterypreventing the procedure from being completed and requiring crossover to the left femoral artery.  Inability to advance the pulmonary artery catheter into the pulmonary artery or capillary wedge position. RV systolic pressure is mildly to moderately elevated.  Severe multivessel coronary disease with chronic total occlusion of the mid RCAcollateralized by right to right and left-to-right sources.  Total occlusion of the mid LAD beyond a large first diagonal. Left  left collaterals are noted.  99% stenosis in the mid to distal native circumflex beyond a very tortuous segment. PCI was not attempted. Distal territory is relatively small.  Inferior wall severe hypokinesis involving the basal to mid segment. Anterior and apical mild hypokinesis is noted. Ejection fraction is estimated to be 35-45%.Presentation was that ofacute on chronic combined systolic and diastolic heart failure.  08/10/2016 TTE  LVEF=40-45% Moderate MAC mild-moderate MR Rv normal  Trivial TR10/13/2017   Patient Profile     84 y.o. female with severe multivessel CAD not amenable to revascularization, chronic systolic and diastolic heart failure (LVEF 40 to 45%), ischemic cardiomyopathy, pulmonary fibrosis admitted with severe sepsis secondary to a urinary tract infection.  Cardiology was consulted for elevated troponin.  Assessment & Plan    1. Severe urosepsis: BP stable. Per primary team.     2. Severe multi-vessel CAD by cath in 2017/Elevated troponin: She has high grade chronic three vessel CAD with no options for PCI by cath in 2017. It is not surprising that her troponin would be elevated in the setting of severe sepsis. No plans for invasive management. Continue ASA, Plavix, beta blocker and Imdur. I would stop heparin.   3. Shock liver: LFTs improving. Statin and Zetia on hold.    4. Acute on chronic systolic CHF: Weight stable. I/O even over last 24 hours. No LE edema. Her lung exam is difficult given pulm fibrosis. I would consider changing to po Lasix today/ She takes Lasix 40 mg po BID at home.   For questions or updates, please contact [place] Please consult [email or web address] for contact info under   Signed, Lauree Chandler, MD  10/06/2018, 8:27 AM

## 2018-10-07 ENCOUNTER — Other Ambulatory Visit: Payer: Self-pay | Admitting: Cardiology

## 2018-10-07 LAB — CULTURE, BLOOD (ROUTINE X 2)
Culture: NO GROWTH
Culture: NO GROWTH
SPECIAL REQUESTS: ADEQUATE

## 2018-10-07 MED ORDER — BISACODYL 10 MG RE SUPP
10.0000 mg | Freq: Every day | RECTAL | Status: DC | PRN
  Administered 2018-10-07: 10 mg via RECTAL
  Filled 2018-10-07 (×2): qty 1

## 2018-10-07 MED ORDER — ACETAMINOPHEN 500 MG PO TABS
500.0000 mg | ORAL_TABLET | Freq: Four times a day (QID) | ORAL | Status: DC | PRN
  Administered 2018-10-07: 500 mg via ORAL
  Filled 2018-10-07: qty 1

## 2018-10-07 MED ORDER — ENOXAPARIN SODIUM 40 MG/0.4ML ~~LOC~~ SOLN
40.0000 mg | SUBCUTANEOUS | Status: DC
  Administered 2018-10-07 – 2018-10-10 (×4): 40 mg via SUBCUTANEOUS
  Filled 2018-10-07 (×5): qty 0.4

## 2018-10-07 MED ORDER — POLYETHYLENE GLYCOL 3350 17 G PO PACK
17.0000 g | PACK | Freq: Every day | ORAL | Status: DC
  Administered 2018-10-07 – 2018-10-09 (×3): 17 g via ORAL
  Filled 2018-10-07 (×4): qty 1

## 2018-10-07 MED ORDER — SENNOSIDES-DOCUSATE SODIUM 8.6-50 MG PO TABS
1.0000 | ORAL_TABLET | Freq: Two times a day (BID) | ORAL | Status: DC
  Administered 2018-10-07 – 2018-10-09 (×5): 1 via ORAL
  Filled 2018-10-07 (×7): qty 1

## 2018-10-07 MED ORDER — SENNA 8.6 MG PO TABS: 1.0000 | ORAL_TABLET | Freq: Every day | ORAL | Status: DC | PRN

## 2018-10-07 NOTE — Progress Notes (Signed)
Progress Note  Patient Name: Linda Kelly Date of Encounter: 10/07/2018  Primary Cardiologist: Kirk Ruths, MD   Subjective   No chest pain.   Inpatient Medications    Scheduled Meds: . aspirin EC  81 mg Oral Daily  . cephALEXin  250 mg Oral Q8H  . clopidogrel  75 mg Oral Daily  . furosemide  40 mg Oral BID  . ipratropium-albuterol  3 mL Nebulization TID  . isosorbide mononitrate  30 mg Oral Daily  . mouth rinse  15 mL Mouth Rinse BID  . metoprolol succinate  25 mg Oral Daily  . montelukast  10 mg Oral QHS  . predniSONE  20 mg Oral Q breakfast   Continuous Infusions: . sodium chloride 10 mL/hr at 10/05/18 0953   PRN Meds: acetaminophen, albuterol, ALPRAZolam, prochlorperazine   Vital Signs    Vitals:   10/07/18 0500 10/07/18 0514 10/07/18 0757 10/07/18 0822  BP:  (!) 124/93  133/69  Pulse:  68  78  Resp:  (!) 24  16  Temp:    (!) 97.5 F (36.4 C)  TempSrc:    Oral  SpO2:  99% 97% 91%  Weight: 68.9 kg     Height:        Intake/Output Summary (Last 24 hours) at 10/07/2018 0847 Last data filed at 10/07/2018 0823 Gross per 24 hour  Intake -  Output 2850 ml  Net -2850 ml   Filed Weights   10/04/18 0420 10/06/18 0500 10/07/18 0500  Weight: 70.4 kg 70 kg 68.9 kg    Telemetry    NSR.  Personally Reviewed  ECG    No am EKG  Physical Exam   VS:  BP 133/69 (BP Location: Right Arm)   Pulse 78   Temp (!) 97.5 F (36.4 C) (Oral)   Resp 16   Ht 4' 9.99" (1.473 m)   Wt 68.9 kg   SpO2 91%   BMI 31.76 kg/m  , BMI Body mass index is 31.76 kg/m.   General: Well developed, well nourished, NAD  HEENT: OP clear, mucus membranes moist  SKIN: warm, dry. No rashes. Neuro: No focal deficits  Musculoskeletal: Muscle strength 5/5 all ext  Psychiatric: Mood and affect normal  Neck: No JVD, no carotid bruits, no thyromegaly, no lymphadenopathy.  Lungs:Basilar crackles.  Cardiovascular: Regular rate and rhythm. No murmurs, gallops or rubs. Abdomen:Soft.  Bowel sounds present. Non-tender.  Extremities: No lower extremity edema. Pulses are 2 + in the bilateral DP/PT.   Labs    Chemistry Recent Labs  Lab 10/03/18 0748 10/04/18 0227 10/05/18 0634 10/06/18 0633  NA 144 143 144 142  K 4.0 3.6 4.1 3.9  CL 111 108 109 101  CO2 21* 23 21* 31  GLUCOSE 113* 129* 130* 109*  BUN 28* 30* 25* 29*  CREATININE 1.77* 1.29* 0.73 0.81  CALCIUM 7.3* 8.3* 8.6* 8.6*  PROT 5.8* 6.1*  --  5.5*  ALBUMIN 3.2* 3.2*  --  2.8*  AST 2,984* 2,349*  --  148*  ALT 1,998* 2,347*  --  882*  ALKPHOS 64 73  --  81  BILITOT 1.2 1.0  --  0.9  GFRNONAA 25* 37* >60 >60  GFRAA 29* 43* >60 >60  ANIONGAP _0 Hematology Recent Labs  Lab 10/04/18 0227 10/05/18 0634 10/06/18 0633  WBC 20.8* 15.5* 18.4*  RBC 3.48* 3.15* 3.34*  3.34*  HGB 10.7* 9.7* 10.1*  HCT 34.1* 31.6* 33.3*  MCV  98.0 100.3* 99.7  MCH 30.7 30.8 30.2  MCHC 31.4 30.7 30.3  RDW 15.1 15.0 14.8  PLT 97* 98* 133*    Cardiac Enzymes Recent Labs  Lab 10/03/18 0100 10/03/18 0748 10/03/18 1407  TROPONINI 12.59* 12.79* 9.44*   No results for input(s): TROPIPOC in the last 168 hours.   BNPNo results for input(s): BNP, PROBNP in the last 168 hours.   DDimer No results for input(s): DDIMER in the last 168 hours.   Radiology    No results found.  Cardiac Studies   76/72/0947 LHC   Complicated procedure that demonstrated high-grade obstruction in the right iliac arterypreventing the procedure from being completed and requiring crossover to the left femoral artery.  Inability to advance the pulmonary artery catheter into the pulmonary artery or capillary wedge position. RV systolic pressure is mildly to moderately elevated.  Severe multivessel coronary disease with chronic total occlusion of the mid RCAcollateralized by right to right and left-to-right sources.  Total occlusion of the mid LAD beyond a large first diagonal. Left left collaterals are noted.  99% stenosis  in the mid to distal native circumflex beyond a very tortuous segment. PCI was not attempted. Distal territory is relatively small.  Inferior wall severe hypokinesis involving the basal to mid segment. Anterior and apical mild hypokinesis is noted. Ejection fraction is estimated to be 35-45%.Presentation was that ofacute on chronic combined systolic and diastolic heart failure.  08/10/2016 TTE  LVEF=40-45% Moderate MAC mild-moderate MR Rv normal  Trivial TR10/13/2017   Patient Profile     84 y.o. female with severe multivessel CAD not amenable to revascularization, chronic systolic and diastolic heart failure (LVEF 40 to 45%), ischemic cardiomyopathy, pulmonary fibrosis admitted with severe sepsis secondary to a urinary tract infection.  Cardiology was consulted for elevated troponin.  Assessment & Plan    1. Severe urosepsis: Her BP is stable. Primary team managing.      2. Severe multi-vessel CAD by cath in 2017/Elevated troponin: She has high grade chronic three vessel CAD with no options for PCI by cath in 2017. Troponin elevated in setting of severe sepsis. Will continue ASA, Plavix, beta blocker and Imdur.    3. Shock liver: LFTs improving. Statin and Zetia on hold.    4. Acute on chronic systolic CHF: She diuresed well yesterday. Continue oral Lasix.   [facility] will sign off.   Medication Recommendations:  No changes Other recommendations (labs, testing, etc):  None Follow up as an outpatient:  Routine schedule    For questions or updates, please contact [facility] Please consult [email or web address] for contact info under   Signed, Lauree Chandler, MD  10/07/2018, 8:46 AM

## 2018-10-07 NOTE — Progress Notes (Signed)
[place] TEAM 1 - Stepdown/ICU TEAM  DELCIE Kelly  EPP:[id number] DOB: August 04, 1928 DOA: 10/02/2018 PCP: Binnie Rail, MD    Brief Narrative:  84 y.o. female w/ a hx of pulmonary fibrosis on chronic home oxygen of 4-5 L, multivessel CAD followed by Dr. Kirk Ruths, and combined systolic and diastolic heart failure (last EF 40-45%) who presented with a day of nausea and vomiting w/ subjective fever, chills, and malaise.   In the ED she was noted to be febrile to 101.4 F w/ O2 sats difficult to obtain on 5 L nasal cannula oxygen. Labs revealed WBC 14.8, creatinine 2.31, AST 1923, ALT 1406, and lactic acid 8.5.  Urinalysis showed large leukocytes and many bacteria.  Chest x-ray showed chronic pulmonary fibrosis without any other acute abnormalities.   Subjective: The patient is sitting up in bed eating breakfast.  She tells me she feels very weak in general and has not yet ambulated to any significant extent.  She lives alone with intermittent but not consistent help from family.  She denies chest pain nausea or vomiting.  She reports that her shortness of breath is slowly improving but that her breathing is not quite back to her baseline.  Assessment & Plan:  Severe sepsis secondary to pan-sensitive E coli UTI Continue antibiotic therapy -clinically responding  Hypotension Resolved w/ volume resuscitation and tx of infection  HTN Some of her home BP meds have been resumed - BP controlled   Elevated troponin - Known severe un-revascularizable multivessel CAD Cardiology has evaluated - known multivessel disease including chronic total occlusion of mid RCA, mid LAD, and 99% occlusion of the mid distal circumflex by cath in 07/2016 - not amenable to intervention - med tx only - w/ recurrent cp and new anterolateral T wave inversions 12/7 heparin was initiated for a 48hr course - cont ASA + Plavix + BB + Imdur    Recent Labs  Lab 10/03/18 0100 10/03/18 0748 10/03/18 1407  TROPONINI  12.59* 12.79* 9.44*    Prolonged QT interval initial QTc 534 - choose medications wisely - monitor trend - QTc 551 12/7  Acute on chronic Hypoxic Respiratory failure - pulmonary fibrosis on chronic immunosuppressive therapy wean O2 support as necessary - presently requiring HFNC but stable -begin to ambulate  Acute renal failure creatinine 1.07 in March, but presented with a creatinine of 2.31 - crt has normalized w/ volume resuscitation   Recent Labs  Lab 10/02/18 1928 10/03/18 0748 10/04/18 0227 10/05/18 0634 10/06/18 0633  CREATININE 2.31* 1.77* 1.29* 0.73 0.81    Elevated liver enzymes Shock liver in setting of sepsis - LFTs have peaked and are now improving rapidly - follow trend - viral hepatitis panel negative   Recent Labs  Lab 10/02/18 1928 10/03/18 0748 10/04/18 0227 10/06/18 0633  AST 1,823* 2,984* 2,349* 148*  ALT 1,406* 1,998* 2,347* 882*  ALKPHOS 78 64 73 81  BILITOT 1.3* 1.2 1.0 0.9  PROT 7.4 5.8* 6.1* 5.5*  ALBUMIN 3.9 3.2* 3.2* 2.8*    Chronic combined systolic and diastolic CHF EF 60-63% in 07/2016 - baseline weight appears to be 64-65kg - follow Is/Os and daily weights - doing well w/ resumption of diuretic   Filed Weights   10/04/18 0420 10/06/18 0500 10/07/18 0500  Weight: 70.4 kg 70 kg 68.9 kg    Normocytic/Macrocytic Anemia Hgb stable w/o evidence of gross blood loss - anemia panel most c/w ACD/poor nutrition   Anxiety Continue Xanax as needed  Hyperlipedema Hold medical  tx until LFTs normal   Aneurysmal aortic root dilation followed in outpatient setting by Cardiology   Thrombocytopenia likely a consequence of a gram neg infection - now improving  Severe deconditioning Patient tells me she lives by herself with intermittent help from a daughter -she will need significant PT/OT prior to being safe to return to this environment -begin to ambulate and assess needs with therapy evaluations  DVT prophylaxis: lovenox  Code  Status: DNR - NO CODE Family Communication: No family present at time of exam Disposition Plan: stable for tele bed - PT/OT   Consultants:  Cardiology   Antimicrobials:  Cefepime 12/5 Rocephin 12/6 > 12/8 Keflex 12/9 >  Objective: Blood pressure 133/69, pulse 78, temperature (!) 97.5 F (36.4 C), temperature source Oral, resp. rate 16, height 4' 9.99" (1.473 m), weight 68.9 kg, SpO2 91 %.  Intake/Output Summary (Last 24 hours) at 10/07/2018 0835 Last data filed at 10/07/2018 4098 Gross per 24 hour  Intake -  Output 2850 ml  Net -2850 ml   Filed Weights   10/04/18 0420 10/06/18 0500 10/07/18 0500  Weight: 70.4 kg 70 kg 68.9 kg    Examination: General: No acute resp distress at rest in bed  Lungs: poor air movement th/o - no wheezing - no crackles  Cardiovascular: RRR - no M or rub  Abdomen: NT/ND, soft, bs+, no mass  Extremities: trace B LE edema  CBC: Recent Labs  Lab 10/02/18 1928 10/03/18 0748 10/04/18 0227 10/05/18 0634 10/06/18 0633  WBC 14.8* 20.6* 20.8* 15.5* 18.4*  NEUTROABS 12.2*  --   --   --   --   HGB 11.6* 10.5* 10.7* 9.7* 10.1*  HCT 37.4 35.4* 34.1* 31.6* 33.3*  MCV 98.7 104.7* 98.0 100.3* 99.7  PLT 143* 101* 97* 98* 119*   Basic Metabolic Panel: Recent Labs  Lab 10/02/18 1928 10/03/18 0748 10/04/18 0227 10/05/18 0634 10/06/18 0633  NA 143 144 143 144 142  K 4.0 4.0 3.6 4.1 3.9  CL 98 111 108 109 101  CO2 22 21* 23 21* 31  GLUCOSE 143* 113* 129* 130* 109*  BUN 26* 28* 30* 25* 29*  CREATININE 2.31* 1.77* 1.29* 0.73 0.81  CALCIUM 8.8* 7.3* 8.3* 8.6* 8.6*  MG  --   --  2.0 1.9  --   PHOS  --   --   --  2.8  --    GFR: Estimated Creatinine Clearance: 40.2 mL/min (by C-G formula based on SCr of 0.81 mg/dL).  Liver Function Tests: Recent Labs  Lab 10/02/18 1928 10/03/18 0748 10/04/18 0227 10/06/18 0633  AST 1,823* 2,984* 2,349* 148*  ALT 1,406* 1,998* 2,347* 882*  ALKPHOS 78 64 73 81  BILITOT 1.3* 1.2 1.0 0.9  PROT 7.4 5.8*  6.1* 5.5*  ALBUMIN 3.9 3.2* 3.2* 2.8*   Recent Labs  Lab 10/02/18 2138  LIPASE 28    Cardiac Enzymes: Recent Labs  Lab 10/03/18 0100 10/03/18 0748 10/03/18 1407  CKTOTAL 343*  --   --   TROPONINI 12.59* 12.79* 9.44*    HbA1C: Hgb A1c MFr Bld  Date/Time Value Ref Range Status  08/09/2016 01:40 AM 5.2 4.8 - 5.6 % Final    Comment:    (NOTE)         Pre-diabetes: 5.7 - 6.4         Diabetes: >6.4         Glycemic control for adults with diabetes: <7.0   02/28/2016 10:46 AM 5.4 4.6 - 6.5 %  Final    Comment:    Glycemic Control Guidelines for People with Diabetes:Non Diabetic:  <6%Goal of Therapy: <7%Additional Action Suggested:  >8%      Recent Results (from the past 240 hour(s))  Blood culture (routine x 2)     Status: None (Preliminary result)   Collection Time: 10/02/18  7:16 PM  Result Value Ref Range Status   Specimen Description BLOOD RIGHT ANTECUBITAL  Final   Special Requests   Final    BOTTLES DRAWN AEROBIC AND ANAEROBIC Blood Culture adequate volume   Culture   Final    NO GROWTH 4 DAYS Performed at [facility], [address], [place], [place] [address]    Report Status PENDING  Incomplete  Blood culture (routine x 2)     Status: None (Preliminary result)   Collection Time: 10/02/18  7:33 PM  Result Value Ref Range Status   Specimen Description BLOOD RIGHT HAND  Final   Special Requests   Final    BOTTLES DRAWN AEROBIC AND ANAEROBIC Blood Culture results may not be optimal due to an inadequate volume of blood received in culture bottles   Culture   Final    NO GROWTH 4 DAYS Performed at [facility], [place] [address] [place], [place] [address]    Report Status PENDING  Incomplete  Urine culture     Status: Abnormal   Collection Time: 10/02/18  7:51 PM  Result Value Ref Range Status   Specimen Description URINE, RANDOM  Final   Special Requests   Final    NONE Performed at [facility], [place] [address] [place], [place] [address]     Culture >=100,000 COLONIES/mL ESCHERICHIA COLI (A)  Final   Report Status 10/05/2018 FINAL  Final   Organism ID, Bacteria ESCHERICHIA COLI (A)  Final      Susceptibility   Escherichia coli - MIC*    AMPICILLIN <=2 SENSITIVE Sensitive     CEFAZOLIN <=4 SENSITIVE Sensitive     CEFTRIAXONE <=1 SENSITIVE Sensitive     CIPROFLOXACIN <=0.25 SENSITIVE Sensitive     GENTAMICIN <=1 SENSITIVE Sensitive     IMIPENEM <=0.25 SENSITIVE Sensitive     NITROFURANTOIN <=16 SENSITIVE Sensitive     TRIMETH/SULFA <=20 SENSITIVE Sensitive     AMPICILLIN/SULBACTAM <=2 SENSITIVE Sensitive     PIP/TAZO <=4 SENSITIVE Sensitive     Extended ESBL NEGATIVE Sensitive     * >=100,000 COLONIES/mL ESCHERICHIA COLI  Respiratory Panel by PCR     Status: None   Collection Time: 10/02/18 11:12 PM  Result Value Ref Range Status   Adenovirus NOT DETECTED NOT DETECTED Final   Coronavirus 229E NOT DETECTED NOT DETECTED Final   Coronavirus HKU1 NOT DETECTED NOT DETECTED Final   Coronavirus NL63 NOT DETECTED NOT DETECTED Final   Coronavirus OC43 NOT DETECTED NOT DETECTED Final   Metapneumovirus NOT DETECTED NOT DETECTED Final   Rhinovirus / Enterovirus NOT DETECTED NOT DETECTED Final   Influenza A NOT DETECTED NOT DETECTED Final   Influenza B NOT DETECTED NOT DETECTED Final   Parainfluenza Virus 1 NOT DETECTED NOT DETECTED Final   Parainfluenza Virus 2 NOT DETECTED NOT DETECTED Final   Parainfluenza Virus 3 NOT DETECTED NOT DETECTED Final   Parainfluenza Virus 4 NOT DETECTED NOT DETECTED Final   Respiratory Syncytial Virus NOT DETECTED NOT DETECTED Final   Bordetella pertussis NOT DETECTED NOT DETECTED Final   Chlamydophila pneumoniae NOT DETECTED NOT DETECTED Final   Mycoplasma pneumoniae  NOT DETECTED NOT DETECTED Final    Comment: Performed at [facility], [place] [address] [place], [place] [address]  MRSA PCR Screening     Status: None   Collection Time: 10/03/18  2:20 PM  Result Value Ref Range Status    MRSA by PCR NEGATIVE NEGATIVE Final    Comment:        The GeneXpert MRSA Assay (FDA approved for NASAL specimens only), is one component of a comprehensive MRSA colonization surveillance program. It is not intended to diagnose MRSA infection nor to guide or monitor treatment for MRSA infections. Performed at [facility], [place] [address] [place], [place] [address]      Scheduled Meds: . aspirin EC  81 mg Oral Daily  . cephALEXin  250 mg Oral Q8H  . clopidogrel  75 mg Oral Daily  . furosemide  40 mg Oral BID  . ipratropium-albuterol  3 mL Nebulization TID  . isosorbide mononitrate  30 mg Oral Daily  . mouth rinse  15 mL Mouth Rinse BID  . metoprolol succinate  25 mg Oral Daily  . montelukast  10 mg Oral QHS  . predniSONE  20 mg Oral Q breakfast   Continuous Infusions: . sodium chloride 10 mL/hr at 10/05/18 0953     LOS: 5 days   Cherene Altes, MD [facility] Office  [phone] Pager - Text Page per Shea Evans as per below:  On-Call/Text Page:      [email or web address]  If 7PM-7AM, please contact night-coverage [email or web address] 10/07/2018, 8:35 AM

## 2018-10-07 NOTE — Care Management Note (Addendum)
Case Management Note  Patient Details  Name: CARLETHIA MESQUITA MRN: [id number] Date of Birth: 04/19/1928  Subjective/Objective:     From home with daughter, NCM offered choice, she chose [facility] for HHPT, [place], she does not want HHRN right now.  Referral given to [facility] with [facility]. Soc will begin 24-48 hrs post dc. She has a walker at home.    12/13 Tomi Bamberger RN, BSN - MD states patient is for possible dc today, she is on 5 liters regular [place] at baseline ,but she may need HFNC at dc.  NCM informed MD about orders for HHPT/HHOT , also if she will need HFNC will need to let Jermaine with [facility] know.               Action/Plan: NCM will follow for transition of care needs. Will  Need HHPT/HHOT orders with face to face.  Patient is also signed up with [facility].    Expected Discharge Date:                  Expected Discharge Plan:  [place]  In-House Referral:     Discharge planning Services  CM Consult  Post Acute Care Choice:  Home Health Choice offered to:  Patient  DME Arranged:    DME Agency:     HH Arranged:  PT, OT HH Agency:  [facility]  Status of Service:  In process, will continue to follow  If discussed at Long Length of Stay Meetings, dates discussed:    Additional Comments:  Zenon Mayo, RN 10/07/2018, 4:28 PM

## 2018-10-07 NOTE — Progress Notes (Signed)
Physical Therapy Treatment Patient Details Name: Linda Kelly MRN: [id number] DOB: February 21, 1928 Today's Date: 10/07/2018    History of Present Illness 84 y.o. female w/ a hx of pulmonary fibrosis on chronic home oxygen of 4-5 L, multivessel CAD followed by Dr.Brian Crenshaw, and combined systolic and diastolic heart failure (last EF 40-45%) who presented with a day of nausea and vomiting w/ subjective fever, chills, and malaise.     PT Comments    Pt desated to 82% on 6 L O2 HFNC during short distance gait to the door of her room and back with RW.  DOE 3/4. Pt reports she has limited mobility at baseline and uses a 4 wheeled RW with a seat.  She walks one room at a time and sits to rest when she gets to the next room. Per evaluating therapist's note, the family will provide assist at d/c.  PT will continue to follow acutely for safe mobility progression   Follow Up Recommendations  Home health PT;Supervision for mobility/OOB     Equipment Recommendations  None recommended by PT    Recommendations for Other Services   NA     Precautions / Restrictions Precautions Precautions: Fall;Other (comment) Precaution Comments: monitor O2    Mobility  Bed Mobility               General bed mobility comments: Pt was OOB on the [facility].  She needed assist to ensure that she was clean (she was not), but she attempted to wipe herself several times.   Transfers Overall transfer level: Needs assistance Equipment used: Rolling walker (2 wheeled) Transfers: Sit to/from Stand Sit to Stand: Min guard         General transfer comment: Min guard assist for safety during transitions.    Ambulation/Gait Ambulation/Gait assistance: Min assist Gait Distance (Feet): 20 Feet Assistive device: Rolling walker (2 wheeled) Gait Pattern/deviations: Step-through pattern;Shuffle;Trunk flexed     General Gait Details: Pt with slow, flexed gait pattern, with increased DOE during gait and fatigued  quickly.  She reports at home she just walks from one room to the other and sits and rests and has a rollator (with a seat).  She uses "5" at home indicating how much O2 she normally uses.  Pt desated to 82% on 6 L O2 [place] during gait, which rebounded to 94% within 2-3 mins after sitting in the chair.       Balance Overall balance assessment: Needs assistance Sitting-balance support: Feet supported;Bilateral upper extremity supported Sitting balance-Leahy Scale: Fair     Standing balance support: Bilateral upper extremity supported Standing balance-Leahy Scale: Poor                              Cognition Arousal/Alertness: Awake/alert Behavior During Therapy: WFL for tasks assessed/performed Overall Cognitive Status: No family/caregiver present to determine baseline cognitive functioning                                 General Comments: not specifically tested             Pertinent Vitals/Pain Pain Assessment: No/denies pain           PT Goals (current goals can now be found in the care plan section) Acute Rehab PT Goals Patient Stated Goal: to go home Progress towards PT goals: Progressing toward goals    Frequency  Min 3X/week      PT Plan Current plan remains appropriate;Frequency needs to be updated       AM-PAC PT "6 Clicks" Mobility   Outcome Measure  Help needed turning from your back to your side while in a flat bed without using bedrails?: A Little Help needed moving from lying on your back to sitting on the side of a flat bed without using bedrails?: A Little Help needed moving to and from a bed to a chair (including a wheelchair)?: A Little Help needed standing up from a chair using your arms (e.g., wheelchair or bedside chair)?: A Little Help needed to walk in hospital room?: A Little Help needed climbing 3-5 steps with a railing? : A Lot 6 Click Score: 17    End of Session Equipment Utilized During Treatment: Gait  belt;Oxygen(6 L O2 HFNC) Activity Tolerance: Patient limited by fatigue Patient left: in chair;with call bell/phone within reach;with chair alarm set Nurse Communication: Mobility status;Other (comment)(O2 sat drop) PT Visit Diagnosis: Unsteadiness on feet (R26.81);Muscle weakness (generalized) (M62.81);Difficulty in walking, not elsewhere classified (R26.2)     Time: 1552-0802 PT Time Calculation (min) (ACUTE ONLY): 24 min  Charges:  $Gait Training: 8-22 mins $Therapeutic Activity: 8-22 mins                    Jammy Stlouis B. Sharlette Jansma, PT, DPT  Acute Rehabilitation [phone] pager #[phone] office   10/07/2018, 5:16 PM

## 2018-10-08 ENCOUNTER — Inpatient Hospital Stay (HOSPITAL_COMMUNITY): Payer: Commercial Managed Care - PPO

## 2018-10-08 LAB — COMPREHENSIVE METABOLIC PANEL
ALT: 450 U/L — ABNORMAL HIGH (ref 0–44)
AST: 111 U/L — ABNORMAL HIGH (ref 15–41)
Albumin: 2.8 g/dL — ABNORMAL LOW (ref 3.5–5.0)
Alkaline Phosphatase: 82 U/L (ref 38–126)
Anion gap: 11 (ref 5–15)
BUN: 32 mg/dL — AB (ref 8–23)
CO2: 34 mmol/L — AB (ref 22–32)
Calcium: 8.4 mg/dL — ABNORMAL LOW (ref 8.9–10.3)
Chloride: 97 mmol/L — ABNORMAL LOW (ref 98–111)
Creatinine, Ser: 0.95 mg/dL (ref 0.44–1.00)
GFR calc Af Amer: >60 mL/min (ref >60)
GFR calc non Af Amer: 54 mL/min — ABNORMAL LOW (ref >60)
GLUCOSE: 98 mg/dL (ref 70–99)
Potassium: 3.6 mmol/L (ref 3.5–5.1)
Sodium: 142 mmol/L (ref 135–145)
Total Bilirubin: 1.1 mg/dL (ref 0.3–1.2)
Total Protein: 5.3 g/dL — ABNORMAL LOW (ref 6.5–8.1)

## 2018-10-08 LAB — CBC
HCT: 34.5 % — ABNORMAL LOW (ref 36.0–46.0)
Hemoglobin: 10.6 g/dL — ABNORMAL LOW (ref 12.0–15.0)
MCH: 30.2 pg (ref 26.0–34.0)
MCHC: 30.7 g/dL (ref 30.0–36.0)
MCV: 98.3 fL (ref 80.0–100.0)
NRBC: 0.2 % (ref 0.0–0.2)
Platelets: 168 10*3/uL (ref 150–400)
RBC: 3.51 MIL/uL — ABNORMAL LOW (ref 3.87–5.11)
RDW: 15.7 % — ABNORMAL HIGH (ref 11.5–15.5)
WBC: 16.1 10*3/uL — ABNORMAL HIGH (ref 4.0–10.5)

## 2018-10-08 NOTE — Progress Notes (Signed)
Occupational Therapy Treatment Patient Details Name: Linda Kelly MRN: [id number] DOB: 02-08-1928 Today's Date: 10/08/2018    History of present illness 84 y.o. female w/ a hx of pulmonary fibrosis on chronic home oxygen of 4-5 L, multivessel CAD followed by Dr.Brian Crenshaw, and combined systolic and diastolic heart failure (last EF 40-45%) who presented with a day of nausea and vomiting w/ subjective fever, chills, and malaise.    OT comments  Pr making good progress with functional goals. Pt desaturated to 85% on 5 L O2 during ambulation from [facility] around bed to recliner and recovered to 90% <1 minute with instructions for pursed lip breathing. Pt participated in toilet transfers to [facility] with min guard A , toileting tasks to manage clothing and hygiene with min A - sup and simulated LB dressing with mod A. OT will continue to follow acutely  Follow Up Recommendations  Home health OT;Supervision/Assistance - 24 hour    Equipment Recommendations  None recommended by OT    Recommendations for Other Services      Precautions / Restrictions Precautions Precautions: Fall Precaution Comments: monitor O2 Restrictions Weight Bearing Restrictions: No       Mobility Bed Mobility Overal bed mobility: Needs Assistance Bed Mobility: Supine to Sit     Supine to sit: Min guard;HOB elevated        Transfers Overall transfer level: Needs assistance Equipment used: Rolling walker (2 wheeled) Transfers: Sit to/from Stand Sit to Stand: Min guard              Balance Overall balance assessment: Needs assistance Sitting-balance support: Feet supported;Bilateral upper extremity supported Sitting balance-Leahy Scale: Fair     Standing balance support: Bilateral upper extremity supported;During functional activity Standing balance-Leahy Scale: Poor                             ADL either performed or assessed with clinical judgement   ADL Overall ADL's : Needs  assistance/impaired     Grooming: Min guard;Standing;Wash/dry hands;Wash/dry face               Lower Body Dressing: Moderate assistance;Sit to/from stand Lower Body Dressing Details (indicate cue type and reason): simulated Toilet Transfer: Min guard;Ambulation;RW;BSC   Toileting- Clothing Manipulation and Hygiene: Minimal assistance;Sit to/from stand       Functional mobility during ADLs: Min guard;Rolling walker       Vision Patient Visual Report: No change from baseline     Perception     Praxis      Cognition Arousal/Alertness: Awake/alert Behavior During Therapy: WFL for tasks assessed/performed Overall Cognitive Status: Within Functional Limits for tasks assessed                                          Exercises     Shoulder Instructions       General Comments  pt very pleasant and cooperative    Pertinent Vitals/ Pain       Pain Assessment: No/denies pain  Home Living                                          Prior Functioning/Environment              Frequency  Min 2X/week        Progress Toward Goals  OT Goals(current goals can now be found in the care plan section)  Progress towards OT goals: Progressing toward goals  Acute Rehab OT Goals Patient Stated Goal: to go home  Plan Discharge plan remains appropriate    Co-evaluation                 AM-PAC OT "6 Clicks" Daily Activity     Outcome Measure   Help from another person eating meals?: None Help from another person taking care of personal grooming?: A Little Help from another person toileting, which includes using toliet, bedpan, or urinal?: A Little Help from another person bathing (including washing, rinsing, drying)?: A Lot Help from another person to put on and taking off regular upper body clothing?: A Little Help from another person to put on and taking off regular lower body clothing?: A Lot 6 Click Score: 17    End  of Session Equipment Utilized During Treatment: Rolling walker;Oxygen;Other (comment)(BSC)  OT Visit Diagnosis: Unsteadiness on feet (R26.81);Other abnormalities of gait and mobility (R26.89);Muscle weakness (generalized) (M62.81)   Activity Tolerance Patient tolerated treatment well   Patient Left in chair;with call bell/phone within reach;with chair alarm set;with nursing/sitter in room   Nurse Communication Mobility status        Time: 4627-0350 OT Time Calculation (min): 28 min  Charges: OT General Charges $OT Visit: 1 Visit OT Treatments $Self Care/Home Management : 8-22 mins $Therapeutic Activity: 8-22 mins     Britt Bottom 10/08/2018, 10:42 AM

## 2018-10-08 NOTE — Progress Notes (Signed)
PROGRESS NOTE    Linda Kelly  QZE:[id number] DOB: [birth year] DOA: 10/02/2018 PCP: Binnie Rail, MD   Brief Narrative: Linda Kelly is a 84 y.o. femalew/ a hx of pulmonary fibrosis on chronic home oxygen of 4-5L, multivessel CADfollowed by Dr. Charise Killian combined systolic and diastolic heart failure (last EF 40-45%)   Assessment & Plan:   Principal Problem:   Severe sepsis ([place]) Active Problems:   CAD S/P percutaneous coronary angioplasty 1985   Hypotension   Pulmonary fibrosis (HCC)   Elevated troponin   Anxiety state   Acute renal failure (ARF) (HCC)   Acute combined systolic and diastolic congestive heart failure (HCC)   Acute lower UTI   Nausea and vomiting   Elevated liver enzymes   Prolonged QT interval   Acute on chronic respiratory failure with hypoxia In setting of pulmonary fibrosis. Still requiring significant amounts of oxygen. Desaturation while ambulating yesterday. Patient uses 5 L as an outpatient. -Chest x-ray -Continue oxygen  Severe sepsis E Coli UTI -Continue Keflex  Interstitial lung disease -Continue albuterol, Duoneb, prednisone  Prolonged QTc -Monitor  Acute kidney injury Resolved  Elevated liver enzymes Secondary to shock liver. Improving.  Chronic combined systolic and diastolic heart failure -Continue lasix  Elevated troponin Severe multivessel CAD Cardiology consulted with no recommendations except for continued medical management. Patient was given a short course of IV heparin -Continue aspirin, Plavix, betablocker and Imdur   DVT prophylaxis: Lovenox Code Status:   Code Status: DNR Family Communication: None at bedside Disposition Plan: Discharge pending improvement of oxygen requirement   Consultants:   Cardiology  Procedures:   None  Antimicrobials:  Vancomycin  Metronidazole  Cefepime  Ceftriaxone  Cephalexin    Subjective: No chest pain or dyspnea today.  Objective: Vitals:   10/08/18 0758 10/08/18 0851 10/08/18 1419 10/08/18 1723  BP:  112/65  (!) 88/62  Pulse: (!) 40 72  78  Resp: 16 (!) 25 20 18   Temp:  (!) 97.5 F (36.4 C)  97.6 F (36.4 C)  TempSrc:  Oral    SpO2: 96% 92% 93% 97%  Weight:      Height:        Intake/Output Summary (Last 24 hours) at 10/08/2018 1830 Last data filed at 10/08/2018 1644 Gross per 24 hour  Intake -  Output 1801 ml  Net -1801 ml   Filed Weights   10/06/18 0500 10/07/18 0500 10/08/18 0500  Weight: 70 kg 68.9 kg 67.4 kg    Examination:  General exam: Appears calm and comfortable Respiratory system: diffuse rales.Marland Kitchen Respiratory effort normal. Cardiovascular system: S1 & S2 heard, RRR. Gastrointestinal system: Abdomen is nondistended, soft and nontender. No organomegaly or masses felt. Normal bowel sounds heard. Central nervous system: Alert and oriented. No focal neurological deficits. Extremities: No edema. No calf tenderness Skin: No cyanosis. No rashes Psychiatry: Judgement and insight appear normal. Mood & affect appropriate.     Data Reviewed: I have personally reviewed following labs and imaging studies  CBC: Recent Labs  Lab 10/02/18 1928 10/03/18 0748 10/04/18 0227 10/05/18 0634 10/06/18 0633 10/08/18 0336  WBC 14.8* 20.6* 20.8* 15.5* 18.4* 16.1*  NEUTROABS 12.2*  --   --   --   --   --   HGB 11.6* 10.5* 10.7* 9.7* 10.1* 10.6*  HCT 37.4 35.4* 34.1* 31.6* 33.3* 34.5*  MCV 98.7 104.7* 98.0 100.3* 99.7 98.3  PLT 143* 101* 97* 98* 133* 226   Basic Metabolic Panel: Recent Labs  Lab 10/03/18  6629 10/04/18 0227 10/05/18 0634 10/06/18 0633 10/08/18 0336  NA 144 143 144 142 142  K 4.0 3.6 4.1 3.9 3.6  CL 111 108 109 101 97*  CO2 21* 23 21* 31 34*  GLUCOSE 113* 129* 130* 109* 98  BUN 28* 30* 25* 29* 32*  CREATININE 1.77* 1.29* 0.73 0.81 0.95  CALCIUM 7.3* 8.3* 8.6* 8.6* 8.4*  MG  --  2.0 1.9  --   --   PHOS  --   --  2.8  --   --    GFR: Estimated Creatinine Clearance: 33.9 mL/min (by  C-G formula based on SCr of 0.95 mg/dL). Liver Function Tests: Recent Labs  Lab 10/02/18 1928 10/03/18 0748 10/04/18 0227 10/06/18 0633 10/08/18 0336  AST 1,823* 2,984* 2,349* 148* 111*  ALT 1,406* 1,998* 2,347* 882* 450*  ALKPHOS 78 64 73 81 82  BILITOT 1.3* 1.2 1.0 0.9 1.1  PROT 7.4 5.8* 6.1* 5.5* 5.3*  ALBUMIN 3.9 3.2* 3.2* 2.8* 2.8*   Recent Labs  Lab 10/02/18 2138  LIPASE 28   No results for input(s): AMMONIA in the last 168 hours. Coagulation Profile: No results for input(s): INR, PROTIME in the last 168 hours. Cardiac Enzymes: Recent Labs  Lab 10/03/18 0100 10/03/18 0748 10/03/18 1407  CKTOTAL 343*  --   --   TROPONINI 12.59* 12.79* 9.44*   BNP (last 3 results) Recent Labs    07/23/18 1111  PROBNP 162.0*   HbA1C: No results for input(s): HGBA1C in the last 72 hours. CBG: No results for input(s): GLUCAP in the last 168 hours. Lipid Profile: No results for input(s): CHOL, HDL, LDLCALC, TRIG, CHOLHDL, LDLDIRECT in the last 72 hours. Thyroid Function Tests: No results for input(s): TSH, T4TOTAL, FREET4, T3FREE, THYROIDAB in the last 72 hours. Anemia Panel: Recent Labs    10/06/18 0633  VITAMINB12 630  FOLATE 7.0  FERRITIN 228  TIBC 245*  IRON 57  RETICCTPCT 3.5*   Sepsis Labs: Recent Labs  Lab 10/02/18 1946 10/02/18 2142 10/03/18 0100 10/03/18 0325  PROCALCITON  --   --  0.19  --   LATICACIDVEN 6.50* 8.50* 6.3* 3.6*    Recent Results (from the past 240 hour(s))  Blood culture (routine x 2)     Status: None   Collection Time: 10/02/18  7:16 PM  Result Value Ref Range Status   Specimen Description BLOOD RIGHT ANTECUBITAL  Final   Special Requests   Final    BOTTLES DRAWN AEROBIC AND ANAEROBIC Blood Culture adequate volume   Culture   Final    NO GROWTH 5 DAYS Performed at [facility], [place] [address], [place], [place] [address]    Report Status 10/07/2018 FINAL  Final  Blood culture (routine x 2)     Status: None   Collection  Time: 10/02/18  7:33 PM  Result Value Ref Range Status   Specimen Description BLOOD RIGHT HAND  Final   Special Requests   Final    BOTTLES DRAWN AEROBIC AND ANAEROBIC Blood Culture results may not be optimal due to an inadequate volume of blood received in culture bottles   Culture   Final    NO GROWTH 5 DAYS Performed at [facility], [place] [address], [place], [place] [address]    Report Status 10/07/2018 FINAL  Final  Urine culture     Status: Abnormal   Collection Time: 10/02/18  7:51 PM  Result Value Ref Range Status   Specimen Description URINE, RANDOM  Final   Special Requests   Final    NONE Performed at [facility], [place] [address], [place], [place] [address]    Culture >=100,000 COLONIES/mL ESCHERICHIA COLI (A)  Final   Report Status 10/05/2018 FINAL  Final   Organism ID, Bacteria ESCHERICHIA COLI (A)  Final      Susceptibility   Escherichia coli - MIC*    AMPICILLIN <=2 SENSITIVE Sensitive     CEFAZOLIN <=4 SENSITIVE Sensitive     CEFTRIAXONE <=1 SENSITIVE Sensitive     CIPROFLOXACIN <=0.25 SENSITIVE Sensitive     GENTAMICIN <=1 SENSITIVE Sensitive     IMIPENEM <=0.25 SENSITIVE Sensitive     NITROFURANTOIN <=16 SENSITIVE Sensitive     TRIMETH/SULFA <=20 SENSITIVE Sensitive     AMPICILLIN/SULBACTAM <=2 SENSITIVE Sensitive     PIP/TAZO <=4 SENSITIVE Sensitive     Extended ESBL NEGATIVE Sensitive     * >=100,000 COLONIES/mL ESCHERICHIA COLI  Respiratory Panel by PCR     Status: None   Collection Time: 10/02/18 11:12 PM  Result Value Ref Range Status   Adenovirus NOT DETECTED NOT DETECTED Final   Coronavirus 229E NOT DETECTED NOT DETECTED Final   Coronavirus HKU1 NOT DETECTED NOT DETECTED Final   Coronavirus NL63 NOT DETECTED NOT DETECTED Final   Coronavirus OC43 NOT DETECTED NOT DETECTED Final   Metapneumovirus NOT DETECTED NOT DETECTED Final   Rhinovirus / Enterovirus NOT DETECTED NOT DETECTED Final   Influenza A NOT DETECTED NOT DETECTED Final    Influenza B NOT DETECTED NOT DETECTED Final   Parainfluenza Virus 1 NOT DETECTED NOT DETECTED Final   Parainfluenza Virus 2 NOT DETECTED NOT DETECTED Final   Parainfluenza Virus 3 NOT DETECTED NOT DETECTED Final   Parainfluenza Virus 4 NOT DETECTED NOT DETECTED Final   Respiratory Syncytial Virus NOT DETECTED NOT DETECTED Final   Bordetella pertussis NOT DETECTED NOT DETECTED Final   Chlamydophila pneumoniae NOT DETECTED NOT DETECTED Final   Mycoplasma pneumoniae NOT DETECTED NOT DETECTED Final    Comment: Performed at [facility], [place]. [address], [place], [place] [address]  MRSA PCR Screening     Status: None   Collection Time: 10/03/18  2:20 PM  Result Value Ref Range Status   MRSA by PCR NEGATIVE NEGATIVE Final    Comment:        The GeneXpert MRSA Assay (FDA approved for NASAL specimens only), is one component of a comprehensive MRSA colonization surveillance program. It is not intended to diagnose MRSA infection nor to guide or monitor treatment for MRSA infections. Performed at [facility], [place] [address] [place], [place] [address]          Radiology Studies: Dg Chest Port 1 View  Result Date: 10/08/2018 CLINICAL DATA:  Acute on chronic respiratory failure with hypoxemia. EXAM: PORTABLE CHEST 1 VIEW COMPARISON:  Chest x-rays dated 10/02/2018 and 07/23/2018 and chest CT dated 11/28/2016 FINDINGS: The heart size and pulmonary vascularity are normal. Tortuosity and calcification of the thoracic aorta. Densely calcified mitral valve annulus. Diffuse chronic interstitial lung disease as previously demonstrated. No infiltrates or effusions. No acute bone abnormality. Bilateral moderate arthritis of the glenohumeral joints. IMPRESSION: No acute abnormalities. Extensive chronic interstitial lung disease. Aortic Atherosclerosis (ICD10-I70.0). Electronically Signed   By: Lorriane Shire M.D.   On: 10/08/2018 11:21        Scheduled Meds: . aspirin EC  81 mg Oral  Daily  . cephALEXin  250 mg Oral Q8H  .  clopidogrel  75 mg Oral Daily  . enoxaparin (LOVENOX) injection  40 mg Subcutaneous Q24H  . furosemide  40 mg Oral BID  . ipratropium-albuterol  3 mL Nebulization TID  . isosorbide mononitrate  30 mg Oral Daily  . mouth rinse  15 mL Mouth Rinse BID  . metoprolol succinate  25 mg Oral Daily  . montelukast  10 mg Oral QHS  . polyethylene glycol  17 g Oral Daily  . predniSONE  20 mg Oral Q breakfast  . senna-docusate  1 tablet Oral BID   Continuous Infusions:   LOS: 6 days     Cordelia Poche, MD [facility] 10/08/2018, 6:30 PM  If 7PM-7AM, please contact night-coverage [email or web address]

## 2018-10-08 NOTE — Progress Notes (Signed)
Physical Therapy Treatment Patient Details Name: Linda Kelly MRN: [id number] DOB: [birth year] Today's Date: 10/08/2018    History of Present Illness 84 y.o. female w/ a hx of pulmonary fibrosis on chronic home oxygen of 4-5 L, multivessel CAD followed by Dr.Brian Crenshaw, and combined systolic and diastolic heart failure (last EF 40-45%) who presented with a day of nausea and vomiting w/ subjective fever, chills, and malaise.     PT Comments    Pt with mild progression today, ambulating slightly further distances than prior session. Cont to desat to 86% on 6L, and during gait today had legs buckle reporting she "got a little weak". At this time absolutely not safe to live alone but with family support, HHPT could be appropriate. She states she has 6 children that all stay with her on and off.     Follow Up Recommendations  Home health PT;Supervision for mobility/OOB     Equipment Recommendations  None recommended by PT    Recommendations for Other Services       Precautions / Restrictions Precautions Precautions: Fall Precaution Comments: monitor O2 Restrictions Weight Bearing Restrictions: No    Mobility  Bed Mobility Overal bed mobility: Needs Assistance Bed Mobility: Supine to Sit     Supine to sit: Min guard;HOB elevated        Transfers Overall transfer level: Needs assistance Equipment used: Rolling walker (2 wheeled) Transfers: Sit to/from Stand Sit to Stand: Min guard         General transfer comment: Min guard assist for safety during transitions.    Ambulation/Gait Ambulation/Gait assistance: Min guard Gait Distance (Feet): 35 Feet Assistive device: 4-wheeled walker Gait Pattern/deviations: Step-through pattern;Shuffle;Trunk flexed Gait velocity: decreasd   General Gait Details: pt with slow gait, using Rollator today but bari so taller than she is use to presenting some issues. min guard, desats on 6L to 86%, RN aware. 1 buckle of legs patient  states she feels weak   Marine scientist [place] (Stroke Patients Only)       Balance Overall balance assessment: Needs assistance Sitting-balance support: Feet supported;Bilateral upper extremity supported Sitting balance-Leahy Scale: Fair     Standing balance support: Bilateral upper extremity supported;During functional activity Standing balance-Leahy Scale: Poor                              Cognition Arousal/Alertness: Awake/alert Behavior During Therapy: WFL for tasks assessed/performed Overall Cognitive Status: Within Functional Limits for tasks assessed                                        Exercises      General Comments        Pertinent Vitals/Pain Pain Assessment: No/denies pain    Home Living                      Prior Function            PT Goals (current goals can now be found in the care plan section) Acute Rehab PT Goals Patient Stated Goal: to go home PT Goal Formulation: With patient/family Time For Goal Achievement: 10/18/18 Potential to Achieve Goals: Good Progress towards PT goals: Progressing toward goals    Frequency    Min  3X/week      PT Plan Current plan remains appropriate;Frequency needs to be updated    Co-evaluation              AM-PAC PT "6 Clicks" Mobility   Outcome Measure  Help needed turning from your back to your side while in a flat bed without using bedrails?: A Little Help needed moving from lying on your back to sitting on the side of a flat bed without using bedrails?: A Little Help needed moving to and from a bed to a chair (including a wheelchair)?: A Little Help needed standing up from a chair using your arms (e.g., wheelchair or bedside chair)?: A Little Help needed to walk in hospital room?: A Little Help needed climbing 3-5 steps with a railing? : A Lot 6 Click Score: 17    End of Session Equipment Utilized During  Treatment: Gait belt;Oxygen(6l) Activity Tolerance: Patient limited by fatigue Patient left: in chair;with call bell/phone within reach;with chair alarm set Nurse Communication: Mobility status;Other (comment) PT Visit Diagnosis: Unsteadiness on feet (R26.81);Muscle weakness (generalized) (M62.81);Difficulty in walking, not elsewhere classified (R26.2)     Time: 1110-1135 PT Time Calculation (min) (ACUTE ONLY): 25 min  Charges:  $Gait Training: 8-22 mins $Therapeutic Activity: 8-22 mins                     Reinaldo Berber, PT, DPT Acute Rehabilitation Services Pager: [phone] Office: [phone]     Reinaldo Berber 10/08/2018, 11:39 AM

## 2018-10-09 DIAGNOSIS — J9621 Acute and chronic respiratory failure with hypoxia: Secondary | ICD-10-CM

## 2018-10-09 DIAGNOSIS — J8410 Pulmonary fibrosis, unspecified: Secondary | ICD-10-CM

## 2018-10-09 LAB — COMPREHENSIVE METABOLIC PANEL
ALBUMIN: 2.9 g/dL — AB (ref 3.5–5.0)
ALT: 311 U/L — ABNORMAL HIGH (ref 0–44)
AST: 67 U/L — ABNORMAL HIGH (ref 15–41)
Alkaline Phosphatase: 81 U/L (ref 38–126)
Anion gap: 14 (ref 5–15)
BUN: 29 mg/dL — AB (ref 8–23)
CO2: 33 mmol/L — ABNORMAL HIGH (ref 22–32)
Calcium: 8.4 mg/dL — ABNORMAL LOW (ref 8.9–10.3)
Chloride: 94 mmol/L — ABNORMAL LOW (ref 98–111)
Creatinine, Ser: 0.84 mg/dL (ref 0.44–1.00)
GFR calc Af Amer: >60 mL/min (ref >60)
GFR calc non Af Amer: >60 mL/min (ref >60)
Glucose, Bld: 90 mg/dL (ref 70–99)
POTASSIUM: 4.1 mmol/L (ref 3.5–5.1)
Sodium: 141 mmol/L (ref 135–145)
Total Bilirubin: 0.8 mg/dL (ref 0.3–1.2)
Total Protein: 5.4 g/dL — ABNORMAL LOW (ref 6.5–8.1)

## 2018-10-09 LAB — CBC
HCT: 36.1 % (ref 36.0–46.0)
Hemoglobin: 11.4 g/dL — ABNORMAL LOW (ref 12.0–15.0)
MCH: 30.9 pg (ref 26.0–34.0)
MCHC: 31.6 g/dL (ref 30.0–36.0)
MCV: 97.8 fL (ref 80.0–100.0)
NRBC: 0 % (ref 0.0–0.2)
Platelets: 161 10*3/uL (ref 150–400)
RBC: 3.69 MIL/uL — ABNORMAL LOW (ref 3.87–5.11)
RDW: 15.4 % (ref 11.5–15.5)
WBC: 15.9 10*3/uL — ABNORMAL HIGH (ref 4.0–10.5)

## 2018-10-09 MED ORDER — METHYLPREDNISOLONE SODIUM SUCC 125 MG IJ SOLR
60.0000 mg | Freq: Two times a day (BID) | INTRAMUSCULAR | Status: AC
  Administered 2018-10-09 – 2018-10-10 (×3): 60 mg via INTRAVENOUS
  Filled 2018-10-09 (×3): qty 2

## 2018-10-09 NOTE — Care Management Important Message (Signed)
Important Message  Patient Details  Name: Linda Kelly MRN: [id number] Date of Birth: 1928-03-27   Medicare Important Message Given:  Yes    [name] [name] 102-12-2017, 3:19 PM

## 2018-10-09 NOTE — Progress Notes (Signed)
PROGRESS NOTE    Linda Kelly  NOB:[id number] DOB: 04-Apr-1928 DOA: 10/02/2018 PCP: Binnie Rail, MD   Brief Narrative: Linda Kelly is a 84 y.o. femalew/ a hx of pulmonary fibrosis on chronic home oxygen of 4-5L, multivessel CADfollowed by Dr. Charise Killian combined systolic and diastolic heart failure (last EF 40-45%). Patient with E. Coli UTI and acute on chronic respiratory failure. Antibiotics. Increasing steroids. Plan for home with home health.   Assessment & Plan:   Principal Problem:   Severe sepsis ([place]) Active Problems:   CAD S/P percutaneous coronary angioplasty 1985   Hypotension   Pulmonary fibrosis (HCC)   Elevated troponin   Anxiety state   Acute renal failure (ARF) (HCC)   Acute combined systolic and diastolic congestive heart failure (HCC)   Acute lower UTI   Nausea and vomiting   Elevated liver enzymes   Prolonged QT interval   Acute on chronic respiratory failure with hypoxia In setting of pulmonary fibrosis. Still requiring significant amounts of oxygen. Desaturation while ambulating yesterday. Patient uses 5 L as an outpatient. -Continue oxygen -Solu-medrol 60 mg q12 hours x3  Severe sepsis E Coli UTI -Continue Keflex  Interstitial lung disease -Continue albuterol, Duoneb, prednisone  Prolonged QTc -Monitor  Acute kidney injury Resolved  Elevated liver enzymes Secondary to shock liver. Improving.  Chronic combined systolic and diastolic heart failure -Continue lasix  Elevated troponin Severe multivessel CAD Cardiology consulted with no recommendations except for continued medical management. Patient was given a short course of IV heparin -Continue aspirin, Plavix, betablocker and Imdur   DVT prophylaxis: Lovenox Code Status:   Code Status: DNR Family Communication: None at bedside Disposition Plan: Discharge hopefully in 24 hours pending improvement of oxygen requirement   Consultants:   Cardiology  Procedures:    None  Antimicrobials:  Vancomycin  Metronidazole  Cefepime  Ceftriaxone  Cephalexin    Subjective: No dyspnea or chest pain. No current concerns.  Objective: Vitals:   10/09/18 0038 10/09/18 0659 10/09/18 0819 10/09/18 0847  BP: 94/62  106/70   Pulse: 78  78   Resp: 17     Temp: 98.1 F (36.7 C)  97.6 F (36.4 C)   TempSrc: Oral  Oral   SpO2: 98%  98% 91%  Weight:  66.8 kg    Height:        Intake/Output Summary (Last 24 hours) at 120-Oct-202019 0926 Last data filed at 120-Oct-202019 0600 Gross per 24 hour  Intake -  Output 3100 ml  Net -3100 ml   Filed Weights   10/07/18 0500 10/08/18 0500 10/09/18 0659  Weight: 68.9 kg 67.4 kg 66.8 kg    Examination:  General exam: Appears calm and comfortable Respiratory system: Rales on left. Respiratory effort normal. Cardiovascular system: S1 & S2 heard, RRR. No murmurs, rubs, gallops or clicks. Gastrointestinal system: Abdomen is nondistended, soft and nontender. Normal bowel sounds heard. Central nervous system: Alert and oriented. No focal neurological deficits. Extremities: No edema. No calf tenderness Skin: No cyanosis. No rashes Psychiatry: Judgement and insight appear normal. Mood & affect appropriate.    Data Reviewed: I have personally reviewed following labs and imaging studies  CBC: Recent Labs  Lab 10/02/18 1928  10/04/18 0227 10/05/18 0634 10/06/18 0633 10/08/18 0336 10/09/18 0825  WBC 14.8*   < > 20.8* 15.5* 18.4* 16.1* 15.9*  NEUTROABS 12.2*  --   --   --   --   --   --   HGB 11.6*   < >  10.7* 9.7* 10.1* 10.6* 11.4*  HCT 37.4   < > 34.1* 31.6* 33.3* 34.5* 36.1  MCV 98.7   < > 98.0 100.3* 99.7 98.3 97.8  PLT 143*   < > 97* 98* 133* 168 161   < > = values in this interval not displayed.   Basic Metabolic Panel: Recent Labs  Lab 10/04/18 0227 10/05/18 0634 10/06/18 0633 10/08/18 0336 10/09/18 0825  NA 143 144 142 142 141  K 3.6 4.1 3.9 3.6 4.1  CL 108 109 101 97* 94*  CO2 23 21* 31  34* 33*  GLUCOSE 129* 130* 109* 98 90  BUN 30* 25* 29* 32* 29*  CREATININE 1.29* 0.73 0.81 0.95 0.84  CALCIUM 8.3* 8.6* 8.6* 8.4* 8.4*  MG 2.0 1.9  --   --   --   PHOS  --  2.8  --   --   --    GFR: Estimated Creatinine Clearance: 38.2 mL/min (by C-G formula based on SCr of 0.84 mg/dL). Liver Function Tests: Recent Labs  Lab 10/03/18 0748 10/04/18 0227 10/06/18 0633 10/08/18 0336 10/09/18 0825  AST 2,984* 2,349* 148* 111* 67*  ALT 1,998* 2,347* 882* 450* 311*  ALKPHOS 64 73 81 82 81  BILITOT 1.2 1.0 0.9 1.1 0.8  PROT 5.8* 6.1* 5.5* 5.3* 5.4*  ALBUMIN 3.2* 3.2* 2.8* 2.8* 2.9*   Recent Labs  Lab 10/02/18 2138  LIPASE 28   No results for input(s): AMMONIA in the last 168 hours. Coagulation Profile: No results for input(s): INR, PROTIME in the last 168 hours. Cardiac Enzymes: Recent Labs  Lab 10/03/18 0100 10/03/18 0748 10/03/18 1407  CKTOTAL 343*  --   --   TROPONINI 12.59* 12.79* 9.44*   BNP (last 3 results) Recent Labs    07/23/18 1111  PROBNP 162.0*   HbA1C: No results for input(s): HGBA1C in the last 72 hours. CBG: No results for input(s): GLUCAP in the last 168 hours. Lipid Profile: No results for input(s): CHOL, HDL, LDLCALC, TRIG, CHOLHDL, LDLDIRECT in the last 72 hours. Thyroid Function Tests: No results for input(s): TSH, T4TOTAL, FREET4, T3FREE, THYROIDAB in the last 72 hours. Anemia Panel: No results for input(s): VITAMINB12, FOLATE, FERRITIN, TIBC, IRON, RETICCTPCT in the last 72 hours. Sepsis Labs: Recent Labs  Lab 10/02/18 1946 10/02/18 2142 10/03/18 0100 10/03/18 0325  PROCALCITON  --   --  0.19  --   LATICACIDVEN 6.50* 8.50* 6.3* 3.6*    Recent Results (from the past 240 hour(s))  Blood culture (routine x 2)     Status: None   Collection Time: 10/02/18  7:16 PM  Result Value Ref Range Status   Specimen Description BLOOD RIGHT ANTECUBITAL  Final   Special Requests   Final    BOTTLES DRAWN AEROBIC AND ANAEROBIC Blood Culture  adequate volume   Culture   Final    NO GROWTH 5 DAYS Performed at [facility], [place] [address] [place], [place] [address]    Report Status 10/07/2018 FINAL  Final  Blood culture (routine x 2)     Status: None   Collection Time: 10/02/18  7:33 PM  Result Value Ref Range Status   Specimen Description BLOOD RIGHT HAND  Final   Special Requests   Final    BOTTLES DRAWN AEROBIC AND ANAEROBIC Blood Culture results may not be optimal due to an inadequate volume of blood received in culture bottles   Culture   Final    NO GROWTH 5 DAYS Performed at  [facility], [place] [address] [place], [place] [address]    Report Status 10/07/2018 FINAL  Final  Urine culture     Status: Abnormal   Collection Time: 10/02/18  7:51 PM  Result Value Ref Range Status   Specimen Description URINE, RANDOM  Final   Special Requests   Final    NONE Performed at [facility], [place] [address], [place], [place] [address]    Culture >=100,000 COLONIES/mL ESCHERICHIA COLI (A)  Final   Report Status 10/05/2018 FINAL  Final   Organism ID, Bacteria ESCHERICHIA COLI (A)  Final      Susceptibility   Escherichia coli - MIC*    AMPICILLIN <=2 SENSITIVE Sensitive     CEFAZOLIN <=4 SENSITIVE Sensitive     CEFTRIAXONE <=1 SENSITIVE Sensitive     CIPROFLOXACIN <=0.25 SENSITIVE Sensitive     GENTAMICIN <=1 SENSITIVE Sensitive     IMIPENEM <=0.25 SENSITIVE Sensitive     NITROFURANTOIN <=16 SENSITIVE Sensitive     TRIMETH/SULFA <=20 SENSITIVE Sensitive     AMPICILLIN/SULBACTAM <=2 SENSITIVE Sensitive     PIP/TAZO <=4 SENSITIVE Sensitive     Extended ESBL NEGATIVE Sensitive     * >=100,000 COLONIES/mL ESCHERICHIA COLI  Respiratory Panel by PCR     Status: None   Collection Time: 10/02/18 11:12 PM  Result Value Ref Range Status   Adenovirus NOT DETECTED NOT DETECTED Final   Coronavirus 229E NOT DETECTED NOT DETECTED Final   Coronavirus HKU1 NOT DETECTED NOT DETECTED Final   Coronavirus NL63 NOT DETECTED  NOT DETECTED Final   Coronavirus OC43 NOT DETECTED NOT DETECTED Final   Metapneumovirus NOT DETECTED NOT DETECTED Final   Rhinovirus / Enterovirus NOT DETECTED NOT DETECTED Final   Influenza A NOT DETECTED NOT DETECTED Final   Influenza B NOT DETECTED NOT DETECTED Final   Parainfluenza Virus 1 NOT DETECTED NOT DETECTED Final   Parainfluenza Virus 2 NOT DETECTED NOT DETECTED Final   Parainfluenza Virus 3 NOT DETECTED NOT DETECTED Final   Parainfluenza Virus 4 NOT DETECTED NOT DETECTED Final   Respiratory Syncytial Virus NOT DETECTED NOT DETECTED Final   Bordetella pertussis NOT DETECTED NOT DETECTED Final   Chlamydophila pneumoniae NOT DETECTED NOT DETECTED Final   Mycoplasma pneumoniae NOT DETECTED NOT DETECTED Final    Comment: Performed at [facility], [place]. [address] [place], [place] [address]  MRSA PCR Screening     Status: None   Collection Time: 10/03/18  2:20 PM  Result Value Ref Range Status   MRSA by PCR NEGATIVE NEGATIVE Final    Comment:        The GeneXpert MRSA Assay (FDA approved for NASAL specimens only), is one component of a comprehensive MRSA colonization surveillance program. It is not intended to diagnose MRSA infection nor to guide or monitor treatment for MRSA infections. Performed at [facility], [place] [address] [place], [place] [address]          Radiology Studies: Dg Chest Port 1 View  Result Date: 10/08/2018 CLINICAL DATA:  Acute on chronic respiratory failure with hypoxemia. EXAM: PORTABLE CHEST 1 VIEW COMPARISON:  Chest x-rays dated 10/02/2018 and 07/23/2018 and chest CT dated 11/28/2016 FINDINGS: The heart size and pulmonary vascularity are normal. Tortuosity and calcification of the thoracic aorta. Densely calcified mitral valve annulus. Diffuse chronic interstitial lung disease as previously demonstrated. No infiltrates or effusions. No acute bone abnormality. Bilateral moderate arthritis of the glenohumeral joints. IMPRESSION: No  acute  abnormalities. Extensive chronic interstitial lung disease. Aortic Atherosclerosis (ICD10-I70.0). Electronically Signed   By: Lorriane Shire M.D.   On: 10/08/2018 11:21        Scheduled Meds: . aspirin EC  81 mg Oral Daily  . cephALEXin  250 mg Oral Q8H  . clopidogrel  75 mg Oral Daily  . enoxaparin (LOVENOX) injection  40 mg Subcutaneous Q24H  . furosemide  40 mg Oral BID  . ipratropium-albuterol  3 mL Nebulization TID  . isosorbide mononitrate  30 mg Oral Daily  . mouth rinse  15 mL Mouth Rinse BID  . methylPREDNISolone (SOLU-MEDROL) injection  60 mg Intravenous Q12H  . metoprolol succinate  25 mg Oral Daily  . montelukast  10 mg Oral QHS  . polyethylene glycol  17 g Oral Daily  . senna-docusate  1 tablet Oral BID   Continuous Infusions:   LOS: 7 days     Cordelia Poche, MD [facility] [id number], 9:26 AM  If 7PM-7AM, please contact night-coverage [email or web address]

## 2018-10-09 NOTE — Progress Notes (Signed)
Physical Therapy Treatment Patient Details Name: Linda Kelly MRN: [id number] DOB: 09/19/1928 Today's Date: 103-22-202019    History of Present Illness 84 y.o. female w/ a hx of pulmonary fibrosis on chronic home oxygen of 4-5 L, multivessel CAD followed by Dr.Brian Crenshaw, and combined systolic and diastolic heart failure (last EF 40-45%) who presented with a day of nausea and vomiting w/ subjective fever, chills, and malaise.     PT Comments    Patient sleeping upon entry today, eventually agreeable to work with therapy but slightly groggy throughout session. Ambulating short distances, continues to require close supervision and light hands on guarding and will need family support if to be returning home.     Follow Up Recommendations  Home health PT;Supervision for mobility/OOB     Equipment Recommendations  None recommended by PT    Recommendations for Other Services       Precautions / Restrictions Precautions Precautions: Fall Restrictions Weight Bearing Restrictions: No    Mobility  Bed Mobility Overal bed mobility: Needs Assistance Bed Mobility: Supine to Sit     Supine to sit: Min guard;HOB elevated Sit to supine: Min assist      Transfers Overall transfer level: Needs assistance Equipment used: Rolling walker (2 wheeled) Transfers: Sit to/from Stand Sit to Stand: Min guard         General transfer comment: Min guard assist for safety during transitions.    Ambulation/Gait Ambulation/Gait assistance: Min guard Gait Distance (Feet): 25 Feet Assistive device: Rolling walker (2 wheeled) Gait Pattern/deviations: Step-through pattern;Shuffle;Trunk flexed Gait velocity: decreasd   General Gait Details: slow gait, using RW today, SpO2 91% on 6L   Stairs             Wheelchair Mobility    Modified Rankin (Stroke Patients Only)       Balance Overall balance assessment: Needs assistance Sitting-balance support: Feet supported;Bilateral  upper extremity supported Sitting balance-Leahy Scale: Fair     Standing balance support: Bilateral upper extremity supported;During functional activity Standing balance-Leahy Scale: Poor                              Cognition Arousal/Alertness: Awake/alert Behavior During Therapy: WFL for tasks assessed/performed Overall Cognitive Status: Within Functional Limits for tasks assessed                                        Exercises      General Comments        Pertinent Vitals/Pain Pain Assessment: No/denies pain    Home Living                      Prior Function            PT Goals (current goals can now be found in the care plan section) Acute Rehab PT Goals Patient Stated Goal: to go home PT Goal Formulation: With patient/family Time For Goal Achievement: 10/18/18 Potential to Achieve Goals: Good Progress towards PT goals: Progressing toward goals    Frequency    Min 3X/week      PT Plan Current plan remains appropriate;Frequency needs to be updated    Co-evaluation              AM-PAC PT "6 Clicks" Mobility   Outcome Measure  Help needed turning from your back to your  side while in a flat bed without using bedrails?: A Little Help needed moving from lying on your back to sitting on the side of a flat bed without using bedrails?: A Little Help needed moving to and from a bed to a chair (including a wheelchair)?: A Little Help needed standing up from a chair using your arms (e.g., wheelchair or bedside chair)?: A Little Help needed to walk in hospital room?: A Little Help needed climbing 3-5 steps with a railing? : A Lot 6 Click Score: 17    End of Session Equipment Utilized During Treatment: Gait belt;Oxygen Activity Tolerance: Patient limited by fatigue Patient left: in chair;with call bell/phone within reach;with chair alarm set Nurse Communication: Mobility status;Other (comment) PT Visit Diagnosis:  Unsteadiness on feet (R26.81);Muscle weakness (generalized) (M62.81);Difficulty in walking, not elsewhere classified (R26.2)     Time: 1240-1305 PT Time Calculation (min) (ACUTE ONLY): 25 min  Charges:  $Gait Training: 8-22 mins $Therapeutic Activity: 8-22 mins                     Reinaldo Berber, PT, DPT Acute Rehabilitation Services Pager: [phone] Office: [place] [phone], 1:10 PM

## 2018-10-10 ENCOUNTER — Encounter (HOSPITAL_COMMUNITY): Payer: Self-pay

## 2018-10-10 ENCOUNTER — Encounter: Payer: Self-pay | Admitting: *Deleted

## 2018-10-10 ENCOUNTER — Other Ambulatory Visit: Payer: Self-pay | Admitting: Cardiology

## 2018-10-10 NOTE — Progress Notes (Signed)
PROGRESS NOTE    DAILY DOE  HBZ:[id number] DOB: 1928-08-19 DOA: 10/02/2018 PCP: Binnie Rail, MD   Brief Narrative: Linda Kelly is a 84 y.o. femalew/ a hx of pulmonary fibrosis on chronic home oxygen of 4-5L, multivessel CADfollowed by Dr. Charise Killian combined systolic and diastolic heart failure (last EF 40-45%). Patient with E. Coli UTI and acute on chronic respiratory failure. Antibiotics. Increasing steroids. Plan for home with home health.   Assessment & Plan:   Principal Problem:   Severe sepsis ([place]) Active Problems:   CAD S/P percutaneous coronary angioplasty 1985   Hypotension   Pulmonary fibrosis (HCC)   Elevated troponin   Anxiety state   Acute renal failure (ARF) (HCC)   Acute combined systolic and diastolic congestive heart failure (HCC)   Acute lower UTI   Nausea and vomiting   Elevated liver enzymes   Prolonged QT interval   Acute on chronic respiratory failure with hypoxia In setting of pulmonary fibrosis. Still requiring significant amounts of oxygen. Desaturation while ambulating yesterday. Patient uses 5 L as an outpatient. -Continue oxygen -Solu-medrol 60 mg q12 hours x3 -Wean off of High flow  Severe sepsis E Coli UTI Completed antibiotic course  Interstitial lung disease -Continue albuterol, Duoneb, prednisone  Prolonged QTc -Monitor  Acute kidney injury Resolved  Elevated liver enzymes Secondary to shock liver. Improving.  Chronic combined systolic and diastolic heart failure -Continue lasix  Elevated troponin Severe multivessel CAD Cardiology consulted with no recommendations except for continued medical management. Patient was given a short course of IV heparin -Continue aspirin, Plavix, betablocker and Imdur   DVT prophylaxis: Lovenox Code Status:   Code Status: DNR Family Communication: None at bedside Disposition Plan: Discharge tomorrow once off high flow with HHPT/OT   Consultants:    Cardiology  Procedures:   None  Antimicrobials:  Vancomycin  Metronidazole  Cefepime  Ceftriaxone  Cephalexin    Subjective: Dyspnea is improved.   Objective: Vitals:   10/10/18 1000 10/10/18 1035 10/10/18 1100 10/10/18 1417  BP:      Pulse:    74  Resp: 17 18 (!) 25 18  Temp:      TempSrc:      SpO2:    95%  Weight:      Height:        Intake/Output Summary (Last 24 hours) at 10/10/2018 1713 Last data filed at 10/10/2018 1041 Gross per 24 hour  Intake -  Output 1550 ml  Net -1550 ml   Filed Weights   10/08/18 0500 10/09/18 0659 10/10/18 0500  Weight: 67.4 kg 66.8 kg 65.6 kg    Examination:  General exam: Appears calm and comfortable Respiratory system: Diffuse rales. Respiratory effort normal. Cardiovascular system: S1 & S2 heard, RRR. Gastrointestinal system: Abdomen is nondistended, soft and nontender. No organomegaly or masses felt. Normal bowel sounds heard. Central nervous system: Alert and oriented. No focal neurological deficits. Extremities: No edema. No calf tenderness Skin: No cyanosis. No rashes Psychiatry: Judgement and insight appear normal. Mood & affect appropriate.    Data Reviewed: I have personally reviewed following labs and imaging studies  CBC: Recent Labs  Lab 10/04/18 0227 10/05/18 0634 10/06/18 0633 10/08/18 0336 10/09/18 0825  WBC 20.8* 15.5* 18.4* 16.1* 15.9*  HGB 10.7* 9.7* 10.1* 10.6* 11.4*  HCT 34.1* 31.6* 33.3* 34.5* 36.1  MCV 98.0 100.3* 99.7 98.3 97.8  PLT 97* 98* 133* 168 101   Basic Metabolic Panel: Recent Labs  Lab 10/04/18 0227 10/05/18 0634 10/06/18  6578 10/08/18 0336 10/09/18 0825  NA 143 144 142 142 141  K 3.6 4.1 3.9 3.6 4.1  CL 108 109 101 97* 94*  CO2 23 21* 31 34* 33*  GLUCOSE 129* 130* 109* 98 90  BUN 30* 25* 29* 32* 29*  CREATININE 1.29* 0.73 0.81 0.95 0.84  CALCIUM 8.3* 8.6* 8.6* 8.4* 8.4*  MG 2.0 1.9  --   --   --   PHOS  --  2.8  --   --   --    GFR: Estimated Creatinine  Clearance: 37.8 mL/min (by C-G formula based on SCr of 0.84 mg/dL). Liver Function Tests: Recent Labs  Lab 10/04/18 0227 10/06/18 0633 10/08/18 0336 10/09/18 0825  AST 2,349* 148* 111* 67*  ALT 2,347* 882* 450* 311*  ALKPHOS 73 81 82 81  BILITOT 1.0 0.9 1.1 0.8  PROT 6.1* 5.5* 5.3* 5.4*  ALBUMIN 3.2* 2.8* 2.8* 2.9*   No results for input(s): LIPASE, AMYLASE in the last 168 hours. No results for input(s): AMMONIA in the last 168 hours. Coagulation Profile: No results for input(s): INR, PROTIME in the last 168 hours. Cardiac Enzymes: No results for input(s): CKTOTAL, CKMB, CKMBINDEX, TROPONINI in the last 168 hours. BNP (last 3 results) Recent Labs    07/23/18 1111  PROBNP 162.0*   HbA1C: No results for input(s): HGBA1C in the last 72 hours. CBG: No results for input(s): GLUCAP in the last 168 hours. Lipid Profile: No results for input(s): CHOL, HDL, LDLCALC, TRIG, CHOLHDL, LDLDIRECT in the last 72 hours. Thyroid Function Tests: No results for input(s): TSH, T4TOTAL, FREET4, T3FREE, THYROIDAB in the last 72 hours. Anemia Panel: No results for input(s): VITAMINB12, FOLATE, FERRITIN, TIBC, IRON, RETICCTPCT in the last 72 hours. Sepsis Labs: No results for input(s): PROCALCITON, LATICACIDVEN in the last 168 hours.  Recent Results (from the past 240 hour(s))  Blood culture (routine x 2)     Status: None   Collection Time: 10/02/18  7:16 PM  Result Value Ref Range Status   Specimen Description BLOOD RIGHT ANTECUBITAL  Final   Special Requests   Final    BOTTLES DRAWN AEROBIC AND ANAEROBIC Blood Culture adequate volume   Culture   Final    NO GROWTH 5 DAYS Performed at [facility], [address], [place], [place] [address]    Report Status 10/07/2018 FINAL  Final  Blood culture (routine x 2)     Status: None   Collection Time: 10/02/18  7:33 PM  Result Value Ref Range Status   Specimen Description BLOOD RIGHT HAND  Final   Special Requests   Final    BOTTLES  DRAWN AEROBIC AND ANAEROBIC Blood Culture results may not be optimal due to an inadequate volume of blood received in culture bottles   Culture   Final    NO GROWTH 5 DAYS Performed at [facility], [place] [address], [place], [place] [address]    Report Status 10/07/2018 FINAL  Final  Urine culture     Status: Abnormal   Collection Time: 10/02/18  7:51 PM  Result Value Ref Range Status   Specimen Description URINE, RANDOM  Final   Special Requests   Final    NONE Performed at [facility], [place] [address], [place], [place] [address]    Culture >=100,000 COLONIES/mL ESCHERICHIA COLI (A)  Final   Report Status 10/05/2018 FINAL  Final   Organism ID, Bacteria ESCHERICHIA COLI (A)  Final  Susceptibility   Escherichia coli - MIC*    AMPICILLIN <=2 SENSITIVE Sensitive     CEFAZOLIN <=4 SENSITIVE Sensitive     CEFTRIAXONE <=1 SENSITIVE Sensitive     CIPROFLOXACIN <=0.25 SENSITIVE Sensitive     GENTAMICIN <=1 SENSITIVE Sensitive     IMIPENEM <=0.25 SENSITIVE Sensitive     NITROFURANTOIN <=16 SENSITIVE Sensitive     TRIMETH/SULFA <=20 SENSITIVE Sensitive     AMPICILLIN/SULBACTAM <=2 SENSITIVE Sensitive     PIP/TAZO <=4 SENSITIVE Sensitive     Extended ESBL NEGATIVE Sensitive     * >=100,000 COLONIES/mL ESCHERICHIA COLI  Respiratory Panel by PCR     Status: None   Collection Time: 10/02/18 11:12 PM  Result Value Ref Range Status   Adenovirus NOT DETECTED NOT DETECTED Final   Coronavirus 229E NOT DETECTED NOT DETECTED Final   Coronavirus HKU1 NOT DETECTED NOT DETECTED Final   Coronavirus NL63 NOT DETECTED NOT DETECTED Final   Coronavirus OC43 NOT DETECTED NOT DETECTED Final   Metapneumovirus NOT DETECTED NOT DETECTED Final   Rhinovirus / Enterovirus NOT DETECTED NOT DETECTED Final   Influenza A NOT DETECTED NOT DETECTED Final   Influenza B NOT DETECTED NOT DETECTED Final   Parainfluenza Virus 1 NOT DETECTED NOT DETECTED Final   Parainfluenza Virus 2 NOT DETECTED NOT  DETECTED Final   Parainfluenza Virus 3 NOT DETECTED NOT DETECTED Final   Parainfluenza Virus 4 NOT DETECTED NOT DETECTED Final   Respiratory Syncytial Virus NOT DETECTED NOT DETECTED Final   Bordetella pertussis NOT DETECTED NOT DETECTED Final   Chlamydophila pneumoniae NOT DETECTED NOT DETECTED Final   Mycoplasma pneumoniae NOT DETECTED NOT DETECTED Final    Comment: Performed at [facility], [place]. [address], [place], [place] [address]  MRSA PCR Screening     Status: None   Collection Time: 10/03/18  2:20 PM  Result Value Ref Range Status   MRSA by PCR NEGATIVE NEGATIVE Final    Comment:        The GeneXpert MRSA Assay (FDA approved for NASAL specimens only), is one component of a comprehensive MRSA colonization surveillance program. It is not intended to diagnose MRSA infection nor to guide or monitor treatment for MRSA infections. Performed at [facility], [place] [address] [place], [place] [address]          Radiology Studies: No results found.      Scheduled Meds: . aspirin EC  81 mg Oral Daily  . clopidogrel  75 mg Oral Daily  . enoxaparin (LOVENOX) injection  40 mg Subcutaneous Q24H  . furosemide  40 mg Oral BID  . ipratropium-albuterol  3 mL Nebulization TID  . isosorbide mononitrate  30 mg Oral Daily  . mouth rinse  15 mL Mouth Rinse BID  . metoprolol succinate  25 mg Oral Daily  . montelukast  10 mg Oral QHS  . polyethylene glycol  17 g Oral Daily  . senna-docusate  1 tablet Oral BID   Continuous Infusions:   LOS: 8 days     Cordelia Poche, MD [facility] 10/10/2018, 5:13 PM  If 7PM-7AM, please contact night-coverage [email or web address]

## 2018-10-10 NOTE — Consult Note (Addendum)
   [facility] CM Inpatient Consult   10/10/2018  Linda Kelly May 14, 1928 [id number]    Patient screened for potential [facility] Care Management services due to unplanned readmission risk score of 23% (high).  Went to bedside to speak with Linda Kelly about [place] Management program. She is agreeable and [name] Care Management written consent obtained. [facility] Care Management folder provided.   Linda Kelly reports she has a supportive daughter, who she lists on [place] Management consent. Linda Kelly (daughter) [phone].  Confirms Primary Care MD is Dr. Quay Burow [facility] listed as doing transition of care call). Confirmed best contact number for Linda Kelly as [phone]. Denies having transportation or medication concerns.   Linda Kelly has a medical history of pulmonary fibrosis, COPD, wears home o2, HF, CAD, PVD, CVA.  Made inpatient RNCM aware [place] Management will follow post discharge.  Will make referral to [facility] for follow up.    Marthenia Rolling, MSN-Ed, RN,BSN [facility] Liaison [phone]

## 2018-10-10 NOTE — Progress Notes (Signed)
Occupational Therapy Treatment Patient Details Name: Linda Kelly MRN: [id number] DOB: 1928/04/15 Today's Date: 10/10/2018    History of present illness 84 y.o. female w/ a hx of pulmonary fibrosis on chronic home oxygen of 4-5 L, multivessel CAD followed by Dr.Brian Crenshaw, and combined systolic and diastolic heart failure (last EF 40-45%) who presented with a day of nausea and vomiting w/ subjective fever, chills, and malaise.    OT comments  Pt progressing towards acute OT goals. Focus of session was increasing activity tolerance during grooming tasks. DOE noted. D/c plan remains appropriate.   Follow Up Recommendations  Home health OT;Supervision/Assistance - 24 hour    Equipment Recommendations  None recommended by OT    Recommendations for Other Services      Precautions / Restrictions Precautions Precautions: Fall Precaution Comments: monitor O2 Restrictions Weight Bearing Restrictions: No       Mobility Bed Mobility               General bed mobility comments: Up in chair  Transfers Overall transfer level: Needs assistance Equipment used: Rolling walker (2 wheeled) Transfers: Sit to/from Stand Sit to Stand: Min guard         General transfer comment: Min guard assist for safety during transitions.      Balance Overall balance assessment: Needs assistance Sitting-balance support: Feet supported;Bilateral upper extremity supported Sitting balance-Leahy Scale: Fair     Standing balance support: Bilateral upper extremity supported;During functional activity Standing balance-Leahy Scale: Poor                             ADL either performed or assessed with clinical judgement   ADL Overall ADL's : Needs assistance/impaired     Grooming: Set up;Min guard;Brushing hair;Sitting;Standing Grooming Details (indicate cue type and reason): stood to comb hair, sat to complete oral care                             Functional  mobility during ADLs: Min guard;Rolling walker General ADL Comments: Pt ambulated to/from sink to complete grooming task.     Vision       Perception     Praxis      Cognition Arousal/Alertness: Awake/alert Behavior During Therapy: WFL for tasks assessed/performed Overall Cognitive Status: Within Functional Limits for tasks assessed                                          Exercises     Shoulder Instructions       General Comments      Pertinent Vitals/ Pain       Pain Assessment: Faces Faces Pain Scale: Hurts a little bit Pain Location: "my little toe hurts" during ambulation Pain Descriptors / Indicators: Aching Pain Intervention(s): Monitored during session  Home Living                                          Prior Functioning/Environment              Frequency  Min 2X/week        Progress Toward Goals  OT Goals(current goals can now be found in the care plan section)  Progress towards  OT goals: Progressing toward goals  Acute Rehab OT Goals Patient Stated Goal: to go home OT Goal Formulation: With patient Time For Goal Achievement: 10/20/18 Potential to Achieve Goals: Good ADL Goals Pt Will Perform Grooming: with set-up;with supervision;standing Pt Will Perform Lower Body Dressing: with min assist;with caregiver independent in assisting;sit to/from stand Pt Will Transfer to Toilet: with set-up;with supervision;regular height toilet;ambulating Pt Will Perform Toileting - Clothing Manipulation and hygiene: with set-up;with supervision;sit to/from stand  Plan Discharge plan remains appropriate    Co-evaluation                 AM-PAC OT "6 Clicks" Daily Activity     Outcome Measure   Help from another person eating meals?: None Help from another person taking care of personal grooming?: A Little Help from another person toileting, which includes using toliet, bedpan, or urinal?: A Little Help from  another person bathing (including washing, rinsing, drying)?: A Lot Help from another person to put on and taking off regular upper body clothing?: A Little Help from another person to put on and taking off regular lower body clothing?: A Lot 6 Click Score: 17    End of Session Equipment Utilized During Treatment: Rolling walker;Oxygen  OT Visit Diagnosis: Unsteadiness on feet (R26.81);Other abnormalities of gait and mobility (R26.89);Muscle weakness (generalized) (M62.81)   Activity Tolerance Patient tolerated treatment well;Other (comment)(DOE)   Patient Left in chair;with call bell/phone within reach   Nurse Communication          Time: 3419-3790 OT Time Calculation (min): 22 min  Charges: OT General Charges $OT Visit: 1 Visit OT Treatments $Self Care/Home Management : 8-22 mins  Tyrone Schimke, OT Acute Rehabilitation Services Pager: [phone] Office: [phone]    Hortencia Pilar 10/10/2018, 12:18 PM

## 2018-10-11 MED ORDER — PREDNISONE 20 MG PO TABS
40.0000 mg | ORAL_TABLET | Freq: Every day | ORAL | Status: DC
  Administered 2018-10-11: 40 mg via ORAL
  Filled 2018-10-11: qty 2

## 2018-10-11 MED ORDER — PREDNISONE 10 MG PO TABS: ORAL_TABLET | ORAL | 0 refills | Status: AC

## 2018-10-11 NOTE — Discharge Instructions (Signed)
Linda Kelly,  You were admitted and treated for a UTI (bladder infection) and increased oxygen use which was secondary to your pulmonary fibrosis. You will be discharged on a prednisone taper. Please follow-up with your primary care physician and your pulmonologist.

## 2018-10-11 NOTE — Discharge Summary (Signed)
Physician Discharge Summary  Linda Kelly:[id number] DOB: 05-03-1928 DOA: 10/02/2018  PCP: Binnie Rail, MD  Admit date: 10/02/2018 Discharge date: 10/11/2018  Admitted From: Home Disposition: Home  Recommendations for Outpatient Follow-up:  1. Follow up with PCP in 1 week 2. Please obtain CMP/CBC in one week 3. Please follow up on the following pending results: None  Home Health: PT Equipment/Devices: Oxygen  Discharge Condition: Stable CODE STATUS: DNR Diet recommendation: Heart healthy   Brief/Interim Summary:  Admission HPI written by Norval Morton, MD   HPI: Linda Kelly is a 84 y.o. female with medical history significant of pulmonary fibrosis on chronic home oxygen of 4-5 L, multivessel CAD followed by Dr.Brian Crenshaw, combined systolic and diastolic heart failure last EF 40-45%; who presents with complaints of nausea and vomiting over the last day.  History is limited due to respiratory distress.  Patient notes associated symptoms of subjective fever, chills, and malaise.  She was unable to give any significant amount of fluid or liquids down due to her symptoms.  Emesis is reportedly nonbloody and she notes some lower abdominal discomfort. Denies having any chest pain, dysuria, history of hepatitis, use of large amounts of Tylenol.  At baseline patient has a chronic productive cough and she reports that her breathing has worsened some since being in the emergency department after fluids were started.   ED Course: On admission into the emergency department patient was noted to be febrile up to 101.4 F, pulse 89-1 01, respirations 9-37, blood pressures 94/71-140/92, and O2 sats difficult to obtain on 5 L nasal cannula oxygen.  Due to difficulty obtaining O2 saturations ABG was obtained and revealed pH of 7.348 , PO2 57, and PCO2 34.5.  Labs revealed WBC 14.8, hemoglobin 11.6, platelets 143, BNP 26, creatinine 2.31, lipase 28, AST 1923, ALT 1406, and lactic acid 6.5,  and repeat lactic acid 8.5.  Urinalysis showed large leukocytes and many bacteria.  Chest x-ray showed chronic pulmonary fibrosis without any other acute abnormalities.  Sepsis protocol has been initiated and patient received a total of 4 L of IV fluids with empiric antibiotics of vancomycin, cefepime, and metronidazole.   Hospital course:  Acute on chronic respiratory failure with hypoxia In setting of pulmonary fibrosis. Required high flow nasal canula with significant desaturation down to low 80s while ambulating initially Patient uses 5 L as an outpatient. Improvement with steroid treatment with sustaining oxygen saturations on ambulation. Prolonged taper on discharge. Down to home oxygen prior to discharge.  Severe sepsis E Coli UTI Completed antibiotic course  Interstitial lung disease Continued albuterol, Duoneb. Steroids as mentioned above.  Prolonged QTc Monitored.  Acute kidney injury Resolved  Elevated liver enzymes Secondary to shock liver. Improved prior to discharge. Recheck CMP as an outpateint.  Chronic combined systolic and diastolic heart failure Continued lasix  Elevated troponin Severe multivessel CAD Cardiology consulted with no recommendations except for continued medical management. Patient was given a short course of IV heparin. Continue aspirin, Plavix, betablocker and Imdur  Discharge Diagnoses:  Principal Problem:   Severe sepsis ([place]) Active Problems:   CAD S/P percutaneous coronary angioplasty 1985   Hypotension   Pulmonary fibrosis (HCC)   Elevated troponin   Anxiety state   Acute renal failure (ARF) (HCC)   Acute combined systolic and diastolic congestive heart failure (HCC)   Acute lower UTI   Nausea and vomiting   Elevated liver enzymes   Prolonged QT interval    Discharge Instructions  Discharge Instructions    AMB Referral to [facility]   Complete by:  As directed    Please assign HTA member to [facility]  for complex case management and HF. Written consent obtained. PCP office Shelba Flake) listed as doing toc. Currently at [facility]. Please call with questions. Thanks. Marthenia Rolling, [place], RN,BSN-[facility] [phone]   Reason for consult:  Please assign to [place]   Diagnoses of:  Heart Failure   Expected date of contact:  1-3 days (reserved for hospital discharges)   Call MD for:  difficulty breathing, headache or visual disturbances   Complete by:  As directed    Call MD for:  extreme fatigue   Complete by:  As directed    Call MD for:  persistant dizziness or light-headedness   Complete by:  As directed    Call MD for:  temperature >100.4   Complete by:  As directed    Diet - low sodium heart healthy   Complete by:  As directed    Increase activity slowly   Complete by:  As directed      Allergies as of 10/11/2018   No Known Allergies     Medication List    TAKE these medications   acetaminophen 650 MG CR tablet Commonly known as:  TYLENOL Take 650 mg by mouth daily.   ALPRAZolam 0.25 MG tablet Commonly known as:  XANAX Take 1 tablet (0.25 mg total) by mouth 3 (three) times daily as needed for anxiety.   aspirin EC 81 MG tablet Take 81 mg by mouth at bedtime.   cholecalciferol 1000 units tablet Commonly known as:  VITAMIN D Take 1,000 Units by mouth at bedtime.   clopidogrel 75 MG tablet Commonly known as:  PLAVIX TAKE 1 TABLET BY MOUTH EVERY DAY   ezetimibe 10 MG tablet Commonly known as:  ZETIA Take 1 tablet (10 mg total) by mouth daily.   famotidine 20 MG tablet Commonly known as:  PEPCID Take 1 tablet (20 mg total) by mouth 2 (two) times daily.   Fish Oil 1000 MG Caps Take 1,000 mg by mouth at bedtime.   furosemide 40 MG tablet Commonly known as:  LASIX TAKE 1 TABLET BY MOUTH TWICE A DAY What changed:  when to take this   ipratropium-albuterol 0.5-2.5 (3) MG/3ML Soln Commonly known as:  DUONEB Take 3 mLs by  nebulization every 4 (four) hours as needed. What changed:  reasons to take this   isosorbide mononitrate 30 MG 24 hr tablet Commonly known as:  IMDUR Take 1 tablet (30 mg total) by mouth daily.   metoprolol succinate 25 MG 24 hr tablet Commonly known as:  TOPROL-XL TAKE 1/2 TABLET EVERY DAY   montelukast 10 MG tablet Commonly known as:  SINGULAIR TAKE 1 TABLET BY MOUTH EVERYDAY AT BEDTIME What changed:  See the new instructions.   nitroGLYCERIN 0.4 MG SL tablet Commonly known as:  NITROSTAT DISSOLVE 1 TABLET UNDER THE TONGUE EVERY 5 MINUTES FOR 3 DOSES AS NEEDED FOR CHEST PAIN. What changed:  See the new instructions.   potassium chloride 10 MEQ tablet Commonly known as:  K-DUR TAKE 1/2 TABLET ONCE DAILY   predniSONE 10 MG tablet Commonly known as:  DELTASONE Take 4 tablets (40 mg total) by mouth daily with breakfast for 3 days, THEN 3 tablets (30 mg total) daily with breakfast for 5 days, THEN 2 tablets (20 mg total) daily with breakfast for 5 days,  THEN 1 tablet (10 mg total) daily with breakfast for 5 days. Start taking on:  October 12, 2018   sertraline 50 MG tablet Commonly known as:  ZOLOFT TAKE 1 TABLET BY MOUTH EVERY DAY   simvastatin 40 MG tablet Commonly known as:  ZOCOR TAKE 1 TABLET BY MOUTH EVERY DAY What changed:  when to take this   vitamin E 400 UNIT capsule Take 400 Units by mouth at bedtime.      Follow-up Information    [facility].   Specialty:  Home Health Services Why:  HHPT, HHOT Contact information: [address] [place] [address] [phone]        [facility] Follow up.   Why:  They will be in contact with you Contact information: [place]. [place]       Binnie Rail, MD. Schedule an appointment as soon as possible for a visit in 1 week(s).   Specialty:  Internal Medicine Why:  Hospital  follow-up Contact information: [place] [address] [phone]        Lelon Perla, MD .   Specialty:  Cardiology Contact information: [address] [place] [address] [phone]          No Known Allergies  Consultations:  None   Procedures/Studies: Ct Abdomen Pelvis Wo Contrast  Result Date: 10/02/2018 CLINICAL DATA:  Abdominal pain, fever, sepsis EXAM: CT ABDOMEN AND PELVIS WITHOUT CONTRAST TECHNIQUE: Multidetector CT imaging of the abdomen and pelvis was performed following the standard protocol without IV contrast. COMPARISON:  None. FINDINGS: Lower chest: Mild cardiomegaly. Dense coronary artery calcifications. Visualized aortic root mildly aneurysmal at 4.1 cm. This is stable since prior chest CT from 11/28/2016. Scarring in the lung bases. Hepatobiliary: No focal hepatic abnormality. Gallbladder unremarkable. Pancreas: No focal abnormality or ductal dilatation. Spleen: No focal abnormality.  Normal size. Adrenals/Urinary Tract: No adrenal abnormality. No focal renal abnormality. No stones or hydronephrosis. Urinary bladder is unremarkable. Stomach/Bowel: Left colonic diverticulosis. No active diverticulitis. Vascular/Lymphatic: Aortic atherosclerosis. No enlarged abdominal or pelvic lymph nodes. Reproductive: Uterus and adnexa unremarkable.  No mass. Other: No free fluid or free air. Musculoskeletal: No acute bony abnormality. Degenerative changes in the lumbar spine. IMPRESSION: No acute findings in the abdomen or pelvis. Left colonic diverticulosis.  No active diverticulitis. Aortic atherosclerosis. Mild aneurysmal dilatation of the aortic root, stable since January 2018. Recommend annual imaging followup by CTA or MRA. This recommendation follows 2010 ACCF/AHA/AATS/ACR/ASA/SCA/SCAI/SIR/STS/SVM Guidelines for the Diagnosis and Management of Patients with Thoracic Aortic Disease. Circulation. 2010; 121: J941-D408 Coronary artery disease.  Electronically Signed   By: Rolm Baptise M.D.   On: 10/02/2018 21:28   Dg Chest Port 1 View  Result Date: 10/08/2018 CLINICAL DATA:  Acute on chronic respiratory failure with hypoxemia. EXAM: PORTABLE CHEST 1 VIEW COMPARISON:  Chest x-rays dated 10/02/2018 and 07/23/2018 and chest CT dated 11/28/2016 FINDINGS: The heart size and pulmonary vascularity are normal. Tortuosity and calcification of the thoracic aorta. Densely calcified mitral valve annulus. Diffuse chronic interstitial lung disease as previously demonstrated. No infiltrates or effusions. No acute bone abnormality. Bilateral moderate arthritis of the glenohumeral joints. IMPRESSION: No acute abnormalities. Extensive chronic interstitial lung disease. Aortic Atherosclerosis (ICD10-I70.0). Electronically Signed   By: Lorriane Shire M.D.   On: 10/08/2018 11:21   Dg Chest Port 1 View  Result Date: 10/02/2018 CLINICAL DATA:  Shortness of breath. EXAM: PORTABLE  CHEST 1 VIEW COMPARISON:  07/23/2018 FINDINGS: Chronic interstitial lung densities in both lungs, left side greater than right. No new airspace disease or consolidation. Heart size is stable. Atherosclerotic calcifications at the aortic arch. Trachea is midline. Negative for pneumothorax. IMPRESSION: Chronic lung disease is compatible with fibrosis. No acute findings. Electronically Signed   By: Markus Daft M.D.   On: 10/02/2018 19:09      Subjective: No chest pain or dyspnea. Feels like she is breathing better.  Discharge Exam:  10/11/18 0731  BP: 108/82  Pulse: 70  Resp: 15  Temp: 98 F (36.7 C)  SpO2: 97%    10/11/18 0400 10/11/18 0500 10/11/18 0731  BP:   108/82  Pulse:   70  Resp: 20 18 15   Temp:   98 F (36.7 C)  TempSrc:   Oral  SpO2:   97%  Weight:     Height:       General: Pt is alert, awake, not in acute distress Cardiovascular: RRR, S1/S2 +, no rubs, no gallops Respiratory: bilateral rales, no wheezing, no rhonchi Abdominal: Soft, NT, ND, bowel sounds  + Extremities: no edema, no cyanosis    The results of significant diagnostics from this hospitalization (including imaging, microbiology, ancillary and laboratory) are listed below for reference.     Microbiology: Recent Results (from the past 240 hour(s))  Blood culture (routine x 2)     Status: None   Collection Time: 10/02/18  7:16 PM  Result Value Ref Range Status   Specimen Description BLOOD RIGHT ANTECUBITAL  Final   Special Requests   Final    BOTTLES DRAWN AEROBIC AND ANAEROBIC Blood Culture adequate volume   Culture   Final    NO GROWTH 5 DAYS Performed at [facility], [address], [place], [place] [address]    Report Status 10/07/2018 FINAL  Final  Blood culture (routine x 2)     Status: None   Collection Time: 10/02/18  7:33 PM  Result Value Ref Range Status   Specimen Description BLOOD RIGHT HAND  Final   Special Requests   Final    BOTTLES DRAWN AEROBIC AND ANAEROBIC Blood Culture results may not be optimal due to an inadequate volume of blood received in culture bottles   Culture   Final    NO GROWTH 5 DAYS Performed at [facility], [place] [address], [place], [place] [address]    Report Status 10/07/2018 FINAL  Final  Urine culture     Status: Abnormal   Collection Time: 10/02/18  7:51 PM  Result Value Ref Range Status   Specimen Description URINE, RANDOM  Final   Special Requests   Final    NONE Performed at [facility], [place] [address] [place], [place] [address]    Culture >=100,000 COLONIES/mL ESCHERICHIA COLI (A)  Final   Report Status 10/05/2018 FINAL  Final   Organism ID, Bacteria ESCHERICHIA COLI (A)  Final      Susceptibility   Escherichia coli - MIC*    AMPICILLIN <=2 SENSITIVE Sensitive     CEFAZOLIN <=4 SENSITIVE Sensitive     CEFTRIAXONE <=1 SENSITIVE Sensitive     CIPROFLOXACIN <=0.25 SENSITIVE Sensitive     GENTAMICIN <=1 SENSITIVE Sensitive     IMIPENEM <=0.25 SENSITIVE Sensitive     NITROFURANTOIN <=16 SENSITIVE  Sensitive     TRIMETH/SULFA <=20 SENSITIVE Sensitive     AMPICILLIN/SULBACTAM <=2 SENSITIVE Sensitive     PIP/TAZO <=4 SENSITIVE  Sensitive     Extended ESBL NEGATIVE Sensitive     * >=100,000 COLONIES/mL ESCHERICHIA COLI  Respiratory Panel by PCR     Status: None   Collection Time: 10/02/18 11:12 PM  Result Value Ref Range Status   Adenovirus NOT DETECTED NOT DETECTED Final   Coronavirus 229E NOT DETECTED NOT DETECTED Final   Coronavirus HKU1 NOT DETECTED NOT DETECTED Final   Coronavirus NL63 NOT DETECTED NOT DETECTED Final   Coronavirus OC43 NOT DETECTED NOT DETECTED Final   Metapneumovirus NOT DETECTED NOT DETECTED Final   Rhinovirus / Enterovirus NOT DETECTED NOT DETECTED Final   Influenza A NOT DETECTED NOT DETECTED Final   Influenza B NOT DETECTED NOT DETECTED Final   Parainfluenza Virus 1 NOT DETECTED NOT DETECTED Final   Parainfluenza Virus 2 NOT DETECTED NOT DETECTED Final   Parainfluenza Virus 3 NOT DETECTED NOT DETECTED Final   Parainfluenza Virus 4 NOT DETECTED NOT DETECTED Final   Respiratory Syncytial Virus NOT DETECTED NOT DETECTED Final   Bordetella pertussis NOT DETECTED NOT DETECTED Final   Chlamydophila pneumoniae NOT DETECTED NOT DETECTED Final   Mycoplasma pneumoniae NOT DETECTED NOT DETECTED Final    Comment: Performed at [facility], [place] [address] [place], [place] [address]  MRSA PCR Screening     Status: None   Collection Time: 10/03/18  2:20 PM  Result Value Ref Range Status   MRSA by PCR NEGATIVE NEGATIVE Final    Comment:        The GeneXpert MRSA Assay (FDA approved for NASAL specimens only), is one component of a comprehensive MRSA colonization surveillance program. It is not intended to diagnose MRSA infection nor to guide or monitor treatment for MRSA infections. Performed at [facility], [place] [address], [place], [place] [address]      Labs: BNP (last 3 results) No results for input(s): BNP in the last 8760 hours. Basic  Metabolic Panel: Recent Labs  Lab 10/05/18 0634 10/06/18 0633 10/08/18 0336 10/09/18 0825  NA 144 142 142 141  K 4.1 3.9 3.6 4.1  CL 109 101 97* 94*  CO2 21* 31 34* 33*  GLUCOSE 130* 109* 98 90  BUN 25* 29* 32* 29*  CREATININE 0.73 0.81 0.95 0.84  CALCIUM 8.6* 8.6* 8.4* 8.4*  MG 1.9  --   --   --   PHOS 2.8  --   --   --    Liver Function Tests: Recent Labs  Lab 10/06/18 0633 10/08/18 0336 10/09/18 0825  AST 148* 111* 67*  ALT 882* 450* 311*  ALKPHOS 81 82 81  BILITOT 0.9 1.1 0.8  PROT 5.5* 5.3* 5.4*  ALBUMIN 2.8* 2.8* 2.9*   No results for input(s): LIPASE, AMYLASE in the last 168 hours. No results for input(s): AMMONIA in the last 168 hours. CBC: Recent Labs  Lab 10/05/18 0634 10/06/18 0633 10/08/18 0336 10/09/18 0825  WBC 15.5* 18.4* 16.1* 15.9*  HGB 9.7* 10.1* 10.6* 11.4*  HCT 31.6* 33.3* 34.5* 36.1  MCV 100.3* 99.7 98.3 97.8  PLT 98* 133* 168 161   Urinalysis    Component Value Date/Time   COLORURINE AMBER (A) 10/02/2018 1959   APPEARANCEUR HAZY (A) 10/02/2018 1959   LABSPEC 1.014 10/02/2018 1959   PHURINE 5.0 10/02/2018 1959   GLUCOSEU NEGATIVE 10/02/2018 1959   HGBUR SMALL (A) 10/02/2018 [place] NEGATIVE 10/02/2018 1959   BILIRUBINUR neg 09/06/2011 [place] 10/02/2018 [place] NEGATIVE 10/02/2018 1959  UROBILINOGEN 0.2 02/26/2014 1306   NITRITE NEGATIVE 10/02/2018 1959   LEUKOCYTESUR LARGE (A) 10/02/2018 1959   Sepsis Labs Invalid input(s): PROCALCITONIN,  WBC,  LACTICIDVEN Microbiology Recent Results (from the past 240 hour(s))  Blood culture (routine x 2)     Status: None   Collection Time: 10/02/18  7:16 PM  Result Value Ref Range Status   Specimen Description BLOOD RIGHT ANTECUBITAL  Final   Special Requests   Final    BOTTLES DRAWN AEROBIC AND ANAEROBIC Blood Culture adequate volume   Culture   Final    NO GROWTH 5 DAYS Performed at [facility], [address], [place], [place]  [address]    Report Status 10/07/2018 FINAL  Final  Blood culture (routine x 2)     Status: None   Collection Time: 10/02/18  7:33 PM  Result Value Ref Range Status   Specimen Description BLOOD RIGHT HAND  Final   Special Requests   Final    BOTTLES DRAWN AEROBIC AND ANAEROBIC Blood Culture results may not be optimal due to an inadequate volume of blood received in culture bottles   Culture   Final    NO GROWTH 5 DAYS Performed at [facility], [place] [address], [place], [place] [address]    Report Status 10/07/2018 FINAL  Final  Urine culture     Status: Abnormal   Collection Time: 10/02/18  7:51 PM  Result Value Ref Range Status   Specimen Description URINE, RANDOM  Final   Special Requests   Final    NONE Performed at [facility], [place] [address], [place], [place] [address]    Culture >=100,000 COLONIES/mL ESCHERICHIA COLI (A)  Final   Report Status 10/05/2018 FINAL  Final   Organism ID, Bacteria ESCHERICHIA COLI (A)  Final      Susceptibility   Escherichia coli - MIC*    AMPICILLIN <=2 SENSITIVE Sensitive     CEFAZOLIN <=4 SENSITIVE Sensitive     CEFTRIAXONE <=1 SENSITIVE Sensitive     CIPROFLOXACIN <=0.25 SENSITIVE Sensitive     GENTAMICIN <=1 SENSITIVE Sensitive     IMIPENEM <=0.25 SENSITIVE Sensitive     NITROFURANTOIN <=16 SENSITIVE Sensitive     TRIMETH/SULFA <=20 SENSITIVE Sensitive     AMPICILLIN/SULBACTAM <=2 SENSITIVE Sensitive     PIP/TAZO <=4 SENSITIVE Sensitive     Extended ESBL NEGATIVE Sensitive     * >=100,000 COLONIES/mL ESCHERICHIA COLI  Respiratory Panel by PCR     Status: None   Collection Time: 10/02/18 11:12 PM  Result Value Ref Range Status   Adenovirus NOT DETECTED NOT DETECTED Final   Coronavirus 229E NOT DETECTED NOT DETECTED Final   Coronavirus HKU1 NOT DETECTED NOT DETECTED Final   Coronavirus NL63 NOT DETECTED NOT DETECTED Final   Coronavirus OC43 NOT DETECTED NOT DETECTED Final   Metapneumovirus NOT DETECTED NOT DETECTED Final    Rhinovirus / Enterovirus NOT DETECTED NOT DETECTED Final   Influenza A NOT DETECTED NOT DETECTED Final   Influenza B NOT DETECTED NOT DETECTED Final   Parainfluenza Virus 1 NOT DETECTED NOT DETECTED Final   Parainfluenza Virus 2 NOT DETECTED NOT DETECTED Final   Parainfluenza Virus 3 NOT DETECTED NOT DETECTED Final   Parainfluenza Virus 4 NOT DETECTED NOT DETECTED Final   Respiratory Syncytial Virus NOT DETECTED NOT DETECTED Final   Bordetella pertussis NOT DETECTED NOT DETECTED Final   Chlamydophila pneumoniae NOT DETECTED NOT DETECTED Final   Mycoplasma pneumoniae NOT DETECTED  NOT DETECTED Final    Comment: Performed at [facility], [place] [address] [place], [place] [address]  MRSA PCR Screening     Status: None   Collection Time: 10/03/18  2:20 PM  Result Value Ref Range Status   MRSA by PCR NEGATIVE NEGATIVE Final    Comment:        The GeneXpert MRSA Assay (FDA approved for NASAL specimens only), is one component of a comprehensive MRSA colonization surveillance program. It is not intended to diagnose MRSA infection nor to guide or monitor treatment for MRSA infections. Performed at [facility], [place] [address], [place],  [address]     SIGNED:   Cordelia Poche, MD [facility] 10/11/2018, 10:06 AM

## 2018-10-11 NOTE — Progress Notes (Signed)
Notified [facility] that patient will DC today 

## 2018-10-13 ENCOUNTER — Telehealth: Payer: Self-pay | Admitting: *Deleted

## 2018-10-13 ENCOUNTER — Other Ambulatory Visit: Payer: Self-pay | Admitting: *Deleted

## 2018-10-13 NOTE — Telephone Encounter (Signed)
Transition Care Management Follow-up Telephone Call   Date discharged? 10/11/18   How have you been since you were released from the hospital? Pt states she is doing alright   Do you understand why you were in the hospital? YES   Do you understand the discharge instructions? YES   Where were you discharged to? Home   Items Reviewed:  Medications reviewed: YES  Allergies reviewed: YES  Dietary changes reviewed: NO  Referrals reviewed: YES, she states someone called from [facility] will be coming out tomorrow. I verified in pt chart has appt w/[facility] 10/14/18 w/NP    Functional Questionnaire:   Activities of Daily Living (ADLs):   She states she are independent in the following: bathing and hygiene, feeding, continence, grooming, toileting and dressing States they require assistance with the following: ambulation   Any transportation issues/concerns?: NO   Any patient concerns? NO   Confirmed importance and date/time of follow-up visits scheduled YES, appt 10/15/18  Provider Appointment booked with Dr. Quay Burow  Confirmed with patient if condition begins to worsen call PCP or go to the ER.  Patient was given the office number and encouraged to call back with question or concerns.  : YES

## 2018-10-13 NOTE — Patient Outreach (Signed)
Initial [facility] outreach s/p hospitalization. Pt referred for complex care management with HF focus, however, hospitalization was due to Sepsis, UTI, dehydration. MD office has done their transition of care call. Pt to see Dr. Quay Burow Wednesday afternoon. Pt agreed to see me Wednesday morning at 11:00 am.  I have provided my name and number. Pt has had [facility] services in the past and she has already signed a new consent form and received an admission folder.  Linda Kelly. Myrtie Neither, MSN, [facility] Gerontological Nurse Practitioner [facility] Care Management [phone]

## 2018-10-14 ENCOUNTER — Telehealth: Payer: Self-pay | Admitting: Internal Medicine

## 2018-10-14 NOTE — Telephone Encounter (Signed)
Only medicine he sees for respiratory is nebulizer.  She is orthostatic hypertensive and he is letting PCP know about this, seated is 110/70, standing 90/60,she reports she has chronic dizziness.

## 2018-10-14 NOTE — Assessment & Plan Note (Signed)
Related to shock liver/sepsis Improved while in the hospital CMP today

## 2018-10-14 NOTE — Telephone Encounter (Signed)
Please advise on noted from [facility].

## 2018-10-14 NOTE — Progress Notes (Signed)
Subjective:    Patient ID: Linda Kelly, female    DOB: 07-Sep-1928, 84 y.o.   MRN: [id number]  HPI The patient is here for follow up from the hospital.  Admitted 10/02/18 - 10/11/18  Recommendations for Outpatient Follow-up:  1. Follow up with PCP in 1 week 2. Please obtain CMP/CBC in one week 3. Please follow up on the following pending results: None  She went to the ED for N/ V for one day.  She had associated subjective fever, chills, malaise, lower abdominal discomfort. She ws not able to drink many fluids due to her symptoms.  Her emesis was nonbloody.  Her breathing had worsened in the ED after fluids were given.   In the ED she was febrile up to 101.4, tachycardic and tachypneic.  Oxygen sats were difficult to get.  WBC was 14.8, BNP 26, Cr 2.31, AST 1923,, ALT 1406, Lipase 28, lactic acid 6.5 ->8.5.  UA showed large leukocytes and many bacteria.  CXR show no acute abnormalities.she was given fluids and started on empiric antibiotics-vancomycin, cefepime and metronidazole.  Acute on chronic respiratory failure with hypoxia: Chronic pulmonary fibrosis on home oxygen 4-5 liters, CHF, CAD Required high flow nasal cannula-had significant desaturations with ambulation Improved with steroid treatment-prolonged taper upon discharge Discharged on 5 L, which is her baseline Doing well on 5 L-currently 98%, but did not ambulate back to exam room She still has the cough 1-2 times a day and she still has SOB.   Severe sepsis, E. coli UTI Completed antibiotics in the hospital She denies dysuria and hematuria but did not have any urinary symptoms to begin with  Interstitial lung disease Continue albuterol, DuoNeb Prolonged steroid taper Has her chronic shortness of breath.  Coffee 1-times a day Iron prolonged steroid taper Sees pulmonary tomorrow  Prolonged QTC Monitored  Acute kidney injury Resolved with fluids  Elevated liver enzymes Secondary to shock  liver Improved Recheck CMP  Combined chronic systolic and diastolic heart failure Continued on Lasix Denies leg swelling  Elevated troponin, multivessel CAD Cardiology consulted-advised to continue medical management She received a short course of IV heparin Continued on aspirin, Plavix, beta-blocker and Imdur Has cardiology follow-up scheduled Continues to have some chest pain, but this is not new and has not escalated   Anxiety: She does get anxious at times. She takes Xanax as needed, but often takes it once a day Medication is effective and she denies side effects.  Medications and allergies reviewed with patient and updated if appropriate.  Patient Active Problem List   Diagnosis Date Noted  . Acute renal failure (ARF) ([place]) 10/03/2018  . Acute combined systolic and diastolic congestive heart failure ([place]) 10/03/2018  . Acute lower UTI 10/03/2018  . Nausea and vomiting 10/03/2018  . Elevated liver enzymes 10/03/2018  . Prolonged QT interval 10/03/2018  . Severe sepsis ([place]) 10/02/2018  . Excessive cerumen in ear canal, bilateral 09/23/2018  . Left-sided headache 09/11/2017  . Ischemic cardiomyopathy 03/14/2017  . Pain of left upper extremity 02/18/2017  . Steroid-induced hyperglycemia 01/08/2017  . Anxiety state 01/08/2017  . Increased oxygen demand   . Palliative care by specialist   . Encounter for hospice care discussion   . Palliative care encounter   . Goals of care, counseling/discussion   . Demand ischemia ([place])   . Elevated troponin   . Chronic respiratory failure with hypoxia ([place]) 11/29/2016  . Episodic lightheadedness 08/28/2016  . Sleep difficulties 08/28/2016  . Chronic combined systolic (  congestive) and diastolic (congestive) heart failure ([place])   . Pulmonary fibrosis ([place])   . Non-ST elevation (NSTEMI) myocardial infarction ([place]) 08/08/2016  . Lumbar radiculopathy 08/01/2016  . Left-sided low back pain without sciatica 03/28/2016  . Cephalalgia  03/10/2016  . Overactive bladder 02/21/2016  . Poor balance 02/21/2016  . Chronic lower back pain 02/21/2016  . Carotid stenosis-s/p CEA 2015 03/03/2014  . PVD (peripheral vascular disease) ([place]) 02/04/2014  . Change in bowel habits 05/22/2011  . URINARY INCONTINENCE 12/22/2010  . ABDOMINAL BRUIT 08/10/2010  . FASTING HYPERGLYCEMIA 11/15/2009  . Depression 08/01/2009  . GERD 08/01/2009  . IRRITABLE BOWEL SYNDROME 12/27/2008  . TINNITUS, CHRONIC 11/05/2008  . HEARING LOSS, HIGH FREQUENCY 11/05/2008  . HIATAL HERNIA 08/11/2008  . Hypotension 06/08/2008  . Lung nodule 11/20/2007  . OBESITY, MILD 11/19/2007  . Hyperlipidemia 10/09/2007  . Tobacco abuse, in remission 10/09/2007  . Essential hypertension 10/09/2007  . CAD S/P percutaneous coronary angioplasty 1985 10/09/2007  . COPD mixed type ([place]) 10/09/2007  . Dizziness 03/31/2007  . Headache 03/31/2007    Current Outpatient Medications on File Prior to Visit  Medication Sig Dispense Refill  . acetaminophen (TYLENOL) 650 MG CR tablet Take 650 mg by mouth daily.    Marland Kitchen ALPRAZolam (XANAX) 0.25 MG tablet Take 1 tablet (0.25 mg total) by mouth 3 (three) times daily as needed for anxiety. 30 tablet 3  . aspirin EC 81 MG tablet Take 81 mg by mouth at bedtime.     . cholecalciferol (VITAMIN D) 1000 UNITS tablet Take 1,000 Units by mouth at bedtime.     . clopidogrel (PLAVIX) 75 MG tablet TAKE 1 TABLET BY MOUTH EVERY DAY (Patient taking differently: Take 75 mg by mouth daily. ) 90 tablet 1  . ezetimibe (ZETIA) 10 MG tablet Take 1 tablet (10 mg total) by mouth daily. 90 tablet 3  . famotidine (PEPCID) 20 MG tablet Take 1 tablet (20 mg total) by mouth 2 (two) times daily. 180 tablet 1  . furosemide (LASIX) 40 MG tablet TAKE 1 TABLET BY MOUTH TWICE A DAY 180 tablet 2  . ipratropium-albuterol (DUONEB) 0.5-2.5 (3) MG/3ML SOLN Take 3 mLs by nebulization every 4 (four) hours as needed. (Patient taking differently: Take 3 mLs by nebulization every 4  (four) hours as needed (shortness of breath). ) 360 mL 12  . metoprolol succinate (TOPROL-XL) 25 MG 24 hr tablet Take 0.5 tablets (12.5 mg total) by mouth daily. 45 tablet 1  . montelukast (SINGULAIR) 10 MG tablet TAKE 1 TABLET BY MOUTH EVERYDAY AT BEDTIME (Patient taking differently: Take 10 mg by mouth at bedtime. ) 90 tablet 1  . nitroGLYCERIN (NITROSTAT) 0.4 MG SL tablet DISSOLVE 1 TABLET UNDER THE TONGUE EVERY 5 MINUTES FOR 3 DOSES AS NEEDED FOR CHEST PAIN. (Patient taking differently: Place 0.4 mg under the tongue every 5 (five) minutes as needed for chest pain. ) 25 tablet 0  . Omega-3 Fatty Acids (FISH OIL) 1000 MG CAPS Take 1,000 mg by mouth at bedtime.     . potassium chloride (K-DUR) 10 MEQ tablet TAKE 1/2 TABLET ONCE DAILY (Patient taking differently: Take 5 mEq by mouth daily. ) 45 tablet 1  . predniSONE (DELTASONE) 10 MG tablet Take 4 tablets (40 mg total) by mouth daily with breakfast for 3 days, THEN 3 tablets (30 mg total) daily with breakfast for 5 days, THEN 2 tablets (20 mg total) daily with breakfast for 5 days, THEN 1 tablet (10 mg total) daily with breakfast for  5 days. 42 tablet 0  . sertraline (ZOLOFT) 50 MG tablet TAKE 1 TABLET BY MOUTH EVERY DAY (Patient taking differently: Take 50 mg by mouth daily. ) 90 tablet 2  . simvastatin (ZOCOR) 40 MG tablet TAKE 1 TABLET BY MOUTH EVERY DAY (Patient taking differently: Take 40 mg by mouth daily at 6 PM. ) 90 tablet 3  . vitamin E 400 UNIT capsule Take 400 Units by mouth at bedtime.     . isosorbide mononitrate (IMDUR) 30 MG 24 hr tablet Take 1 tablet (30 mg total) by mouth daily. 30 tablet 2   No current facility-administered medications on file prior to visit.     Past Medical History:  Diagnosis Date  . Anxiety   . Anxiety and depression   . Arthritis   . Benign neoplasm of esophagus   . CAD (coronary artery disease)   . Cancer [facility])    Skin cancer on back  . Carotid artery disease ([place])   . CHF (congestive heart failure)  ([place])   . Chronic gastritis   . Colon, diverticulosis   . COPD (chronic obstructive pulmonary disease) ([place])   . Depression   . GERD (gastroesophageal reflux disease)   . Hiatal hernia   . High frequency hearing loss   . Hyperlipidemia   . IBS (irritable bowel syndrome)   . Idiopathic pulmonary fibrosis   . Internal hemorrhoid   . MI (myocardial infarction) ([place]) 1985  . Other nonthrombocytopenic purpuras   . PONV (postoperative nausea and vomiting)   . PVD (peripheral vascular disease) ([place])   . Shortness of breath    with exertion  . Solitary pulmonary nodule   . Stroke [facility])    Hx: of several mini -strokes  . Tinnitus    chronic  . Vertigo     Past Surgical History:  Procedure Laterality Date  . ANGIOPLASTY  1997, 98, 99  . BREAST SURGERY     Hx; of biopsy  . CARDIAC CATHETERIZATION N/A 08/10/2016   Procedure: Left Heart Cath and Coronary Angiography;  Surgeon: Belva Crome, MD;  Location: [place] CV [facility];  Service: Cardiovascular;  Laterality: N/A;  . CAROTID ENDARTERECTOMY     left side x 3, saphenous vein graft from left leg  . CATARACT EXTRACTION W/ INTRAOCULAR LENS  IMPLANT, BILATERAL    . COLON SURGERY    . COLONOSCOPY  11-27-01, 10-30-05, 05-24-11   diverticulosis, hemorrhoids  . DENTAL SURGERY     2012   . ENDARTERECTOMY Right 03/03/2014   Procedure: RIGHT CAROTID ENDARTERECTOMY WITH PATCH ANGIOPLASTY;  Surgeon: Rosetta Posner, MD;  Location: [facility];  Service: Vascular;  Laterality: Right;  . FOOT SURGERY     bilateral  . SHOULDER SURGERY     left  . TOE SURGERY     right foot  . TONSILLECTOMY    . TUBAL LIGATION    . UPPER GASTROINTESTINAL ENDOSCOPY  11-27-01, 08-18-08   Hiatal hernia, benign esophagus neoplasia, gastritis, tortuous esophagus 68 Maloney dilation performed    Social History   Socioeconomic History  . Marital status: Widowed    Spouse name: Not on file  . Number of children: 6  . Years of education: Not on file  . Highest education  level: Not on file  Occupational History  . Occupation: Armed forces operational officer at [employer]: [place]  . Financial resource strain: Not hard at all  . Food insecurity:    Worry:  Never true    Inability: Never true  . Transportation needs:    Medical: No    Non-medical: No  Tobacco Use  . Smoking status: Former Smoker    Packs/day: 0.50    Years: 60.00    Pack years: 30.00    Types: Cigarettes    Last attempt to quit: 02/26/2014    Years since quitting: 4.6  . Smokeless tobacco: Never Used  Substance and Sexual Activity  . Alcohol use: No  . Drug use: No  . Sexual activity: Never  Lifestyle  . Physical activity:    Days per week: 0 days    Minutes per session: 0 min  . Stress: Only a little  Relationships  . Social connections:    Talks on phone: More than three times a week    Gets together: More than three times a week    Attends religious service: 1 to 4 times per year    Active member of club or organization: Not on file    Attends meetings of clubs or organizations: 1 to 4 times per year    Relationship status: Widowed  Other Topics Concern  . Not on file  Social History Narrative   [place] to [place] twice a year to visit sister- 3-4 weeks travel there every year.   Pt does not get regular exercise    Family History  Problem Relation Age of Onset  . Heart attack Father        deceased at 15, MGF  . Colon cancer Father   . Prostate cancer Father   . Heart disease Father   . Hyperlipidemia Father   . Heart disease Mother        deaceased at age 46  . Colitis Mother   . Stroke Mother   . Hyperlipidemia Mother   . Colitis Sister   . Hyperlipidemia Sister   . Other Brother        deceased age 30; war  . Hyperlipidemia Brother   . Breast cancer Maternal Aunt        great   . Lung cancer Paternal Aunt        great  . Vasculitis Son   . Hyperlipidemia Son   . Hypertension Son   . Heart  attack Son   . Cancer Sister        unknown  . Hyperlipidemia Sister   . Hyperlipidemia Daughter   . Hypertension Daughter   . COPD Neg Hx   . Asthma Neg Hx     Review of Systems  Constitutional: Negative for chills and fever.  Respiratory: Positive for cough (1-2 times a day) and shortness of breath. Negative for wheezing.   Cardiovascular: Positive for chest pain (occ - had ntg once). Negative for palpitations and leg swelling.  Gastrointestinal: Negative for abdominal pain, constipation and diarrhea.  Genitourinary: Negative for dysuria and hematuria.  Neurological: Positive for light-headedness and headaches (occ, transient).       Objective:   Vitals:   10/15/18 1349  BP: 114/78  Pulse: 82  Resp: 16  Temp: 98.3 F (36.8 C)  SpO2: 98%   BP Readings from Last 3 Encounters:  10/15/18 114/78  10/11/18 108/82  09/23/18 128/80   Wt Readings from Last 3 Encounters:  10/15/18 137 lb 12.8 oz (62.5 kg)  10/10/18 144 lb 10 oz (65.6 kg)  09/23/18 137 lb 12.8 oz (62.5 kg)   Body mass index is  28.81 kg/m.   Physical Exam    Constitutional: Appears well-developed and well-nourished. No distress.  HENT:  Head: Normocephalic and atraumatic.  Neck: Neck supple. No tracheal deviation present. No thyromegaly present.  No cervical lymphadenopathy Cardiovascular: Normal rate, regular rhythm and normal heart sounds.   2/6 systolic murmur heard. No carotid bruit .  No edema Pulmonary/Chest:mild tachypnea, No respiratory distress. No has no wheezes. Diffuse dry crackles at baseline  Abdomen: soft, non tender, non distended Skin: Skin is warm and dry. Not diaphoretic.  Psychiatric: Normal mood and affect. Behavior is normal.   DG CHEST PORT 1 VIEW CLINICAL DATA:  Acute on chronic respiratory failure with hypoxemia.  EXAM: PORTABLE CHEST 1 VIEW  COMPARISON:  Chest x-rays dated 10/02/2018 and 07/23/2018 and chest CT dated 11/28/2016  FINDINGS: The heart size and pulmonary  vascularity are normal. Tortuosity and calcification of the thoracic aorta. Densely calcified mitral valve annulus.  Diffuse chronic interstitial lung disease as previously demonstrated. No infiltrates or effusions. No acute bone abnormality. Bilateral moderate arthritis of the glenohumeral joints.  IMPRESSION: No acute abnormalities. Extensive chronic interstitial lung disease.  Aortic Atherosclerosis (ICD10-I70.0).  Electronically Signed   By: Lorriane Shire M.D.   On: 10/08/2018 11:21   Lab Results  Component Value Date   WBC 15.9 (H) 1March 02, 202019   HGB 11.4 (L) 1March 02, 202019   HCT 36.1 1March 02, 202019   PLT 161 1March 02, 202019   GLUCOSE 90 1March 02, 202019   CHOL 156 09/10/2018   TRIG 247 (H) 09/10/2018   HDL 38 (L) 09/10/2018   LDLCALC 69 09/10/2018   ALT 311 (H) 1March 02, 202019   AST 67 (H) 1March 02, 202019   NA 141 1March 02, 202019   K 4.1 1March 02, 202019   CL 94 (L) 1March 02, 202019   CREATININE 0.84 1March 02, 202019   BUN 29 (H) 1March 02, 202019   CO2 33 (H) 1March 02, 202019   TSH 1.92 02/28/2016   INR 1.19 08/09/2016   HGBA1C 5.2 08/09/2016     Assessment & Plan:    See Problem List for Assessment and Plan of chronic medical problems.

## 2018-10-14 NOTE — Telephone Encounter (Signed)
Spoke with Clair Gulling at [facility] and he stated the patient's oxygen level was dropping low after walking and wondered if she had an upcoming appt. She has one on 01/21/2018. CY did you want her to come in earlier possibly to see an NP for hospital follow up? Please advise.   Current Outpatient Medications on File Prior to Visit  Medication Sig Dispense Refill  . acetaminophen (TYLENOL) 650 MG CR tablet Take 650 mg by mouth daily.    Marland Kitchen ALPRAZolam (XANAX) 0.25 MG tablet Take 1 tablet (0.25 mg total) by mouth 3 (three) times daily as needed for anxiety. 30 tablet 3  . aspirin EC 81 MG tablet Take 81 mg by mouth at bedtime.     . cholecalciferol (VITAMIN D) 1000 UNITS tablet Take 1,000 Units by mouth at bedtime.     . clopidogrel (PLAVIX) 75 MG tablet TAKE 1 TABLET BY MOUTH EVERY DAY (Patient taking differently: Take 75 mg by mouth daily. ) 90 tablet 1  . ezetimibe (ZETIA) 10 MG tablet Take 1 tablet (10 mg total) by mouth daily. 90 tablet 3  . famotidine (PEPCID) 20 MG tablet Take 1 tablet (20 mg total) by mouth 2 (two) times daily. 180 tablet 1  . furosemide (LASIX) 40 MG tablet TAKE 1 TABLET BY MOUTH TWICE A DAY 180 tablet 2  . ipratropium-albuterol (DUONEB) 0.5-2.5 (3) MG/3ML SOLN Take 3 mLs by nebulization every 4 (four) hours as needed. (Patient taking differently: Take 3 mLs by nebulization every 4 (four) hours as needed (shortness of breath). ) 360 mL 12  . isosorbide mononitrate (IMDUR) 30 MG 24 hr tablet Take 1 tablet (30 mg total) by mouth daily. 30 tablet 2  . metoprolol succinate (TOPROL-XL) 25 MG 24 hr tablet Take 0.5 tablets (12.5 mg total) by mouth daily. 45 tablet 1  . montelukast (SINGULAIR) 10 MG tablet TAKE 1 TABLET BY MOUTH EVERYDAY AT BEDTIME (Patient taking differently: Take 10 mg by mouth at bedtime. ) 90 tablet 1  . nitroGLYCERIN (NITROSTAT) 0.4 MG SL tablet DISSOLVE 1 TABLET UNDER THE TONGUE EVERY 5 MINUTES FOR 3 DOSES AS NEEDED FOR CHEST PAIN. (Patient taking differently: Place 0.4 mg under  the tongue every 5 (five) minutes as needed for chest pain. ) 25 tablet 0  . Omega-3 Fatty Acids (FISH OIL) 1000 MG CAPS Take 1,000 mg by mouth at bedtime.     . potassium chloride (K-DUR) 10 MEQ tablet TAKE 1/2 TABLET ONCE DAILY (Patient taking differently: Take 5 mEq by mouth daily. ) 45 tablet 1  . predniSONE (DELTASONE) 10 MG tablet Take 4 tablets (40 mg total) by mouth daily with breakfast for 3 days, THEN 3 tablets (30 mg total) daily with breakfast for 5 days, THEN 2 tablets (20 mg total) daily with breakfast for 5 days, THEN 1 tablet (10 mg total) daily with breakfast for 5 days. 42 tablet 0  . sertraline (ZOLOFT) 50 MG tablet TAKE 1 TABLET BY MOUTH EVERY DAY (Patient taking differently: Take 50 mg by mouth daily. ) 90 tablet 2  . simvastatin (ZOCOR) 40 MG tablet TAKE 1 TABLET BY MOUTH EVERY DAY (Patient taking differently: Take 40 mg by mouth daily at 6 PM. ) 90 tablet 3  . vitamin E 400 UNIT capsule Take 400 Units by mouth at bedtime.      No current facility-administered medications on file prior to visit.    No Known Allergies

## 2018-10-14 NOTE — Telephone Encounter (Signed)
[place] for PT  Will discuss others with pt at appointment Wednesday

## 2018-10-14 NOTE — Telephone Encounter (Signed)
Spoke with pt, she made an appt with Beth on Thursday 12/19 to be evaluated. Nothing further is needed.

## 2018-10-14 NOTE — Telephone Encounter (Signed)
I think it would be good if she could be seen by an NP for acute on chronic hypoxic respiratory failure, post hospital, before Christmas.

## 2018-10-14 NOTE — Telephone Encounter (Signed)
Copied from [place] [id number]. Topic: Quick Communication - Home Health Verbal Orders >> Oct 14, 2018 11:14 AM Carolyn Stare wrote: Markus Daft, with [facility] Number [phone]  Requesting verbal PT   orders  Frequency   2 x 1 1 x 1 2 x 1   He said Missing xanax, pt bp seat 110/70 standing 90/60 did report dizzyness , walk 50 feet her oxgyen goes from 97 to 82 then it takes a minute to get above 90 pt on 4 liters of oxgyen

## 2018-10-15 ENCOUNTER — Encounter: Payer: Self-pay | Admitting: *Deleted

## 2018-10-15 ENCOUNTER — Other Ambulatory Visit: Payer: Self-pay | Admitting: *Deleted

## 2018-10-15 ENCOUNTER — Encounter: Payer: Self-pay | Admitting: Internal Medicine

## 2018-10-15 ENCOUNTER — Ambulatory Visit (INDEPENDENT_AMBULATORY_CARE_PROVIDER_SITE_OTHER): Payer: PPO | Admitting: Internal Medicine

## 2018-10-15 ENCOUNTER — Other Ambulatory Visit (INDEPENDENT_AMBULATORY_CARE_PROVIDER_SITE_OTHER): Payer: PPO

## 2018-10-15 VITALS — BP 114/78 | HR 82 | Temp 98.3°F | Resp 16 | Ht <= 58 in | Wt 137.8 lb

## 2018-10-15 DIAGNOSIS — R748 Abnormal levels of other serum enzymes: Secondary | ICD-10-CM

## 2018-10-15 DIAGNOSIS — A419 Sepsis, unspecified organism: Secondary | ICD-10-CM | POA: Diagnosis not present

## 2018-10-15 DIAGNOSIS — J9611 Chronic respiratory failure with hypoxia: Secondary | ICD-10-CM | POA: Diagnosis not present

## 2018-10-15 DIAGNOSIS — F411 Generalized anxiety disorder: Secondary | ICD-10-CM

## 2018-10-15 DIAGNOSIS — I10 Essential (primary) hypertension: Secondary | ICD-10-CM

## 2018-10-15 DIAGNOSIS — R6520 Severe sepsis without septic shock: Secondary | ICD-10-CM

## 2018-10-15 DIAGNOSIS — R112 Nausea with vomiting, unspecified: Secondary | ICD-10-CM

## 2018-10-15 DIAGNOSIS — N390 Urinary tract infection, site not specified: Secondary | ICD-10-CM | POA: Diagnosis not present

## 2018-10-15 DIAGNOSIS — N179 Acute kidney failure, unspecified: Secondary | ICD-10-CM

## 2018-10-15 LAB — COMPREHENSIVE METABOLIC PANEL
ALK PHOS: 82 U/L (ref 39–117)
ALT: 81 U/L — ABNORMAL HIGH (ref 0–35)
AST: 21 U/L (ref 0–37)
Albumin: 4 g/dL (ref 3.5–5.2)
BUN: 35 mg/dL — ABNORMAL HIGH (ref 6–23)
CO2: 31 mEq/L (ref 19–32)
Calcium: 9 mg/dL (ref 8.4–10.5)
Chloride: 98 mEq/L (ref 96–112)
Creatinine, Ser: 1.01 mg/dL (ref 0.40–1.20)
GFR: 54.98 mL/min — ABNORMAL LOW (ref >60.00)
Glucose, Bld: 94 mg/dL (ref 70–99)
Potassium: 4.5 mEq/L (ref 3.5–5.1)
Sodium: 139 mEq/L (ref 135–145)
Total Bilirubin: 1 mg/dL (ref 0.2–1.2)
Total Protein: 6.8 g/dL (ref 6.0–8.3)

## 2018-10-15 LAB — CBC WITH DIFFERENTIAL/PLATELET
Basophils Absolute: 0.1 10*3/uL (ref 0.0–0.1)
Basophils Relative: 0.3 % (ref 0.0–3.0)
Eosinophils Absolute: 0 10*3/uL (ref 0.0–0.7)
Eosinophils Relative: 0.1 % (ref 0.0–5.0)
HCT: 41.5 % (ref 36.0–46.0)
Hemoglobin: 13.5 g/dL (ref 12.0–15.0)
Lymphocytes Relative: 6.1 % — ABNORMAL LOW (ref 12.0–46.0)
Lymphs Abs: 1.5 10*3/uL (ref 0.7–4.0)
MCHC: 32.5 g/dL (ref 30.0–36.0)
MCV: 96.8 fl (ref 78.0–100.0)
MONOS PCT: 7.9 % (ref 3.0–12.0)
Monocytes Absolute: 1.9 10*3/uL — ABNORMAL HIGH (ref 0.1–1.0)
Neutro Abs: 20.6 10*3/uL — ABNORMAL HIGH (ref 1.4–7.7)
Neutrophils Relative %: 85.6 % — ABNORMAL HIGH (ref 43.0–77.0)
Platelets: 321 10*3/uL (ref 150.0–400.0)
RBC: 4.29 Mil/uL (ref 3.87–5.11)
RDW: 16.9 % — ABNORMAL HIGH (ref 11.5–15.5)
WBC: 24.1 10*3/uL (ref 4.0–10.5)

## 2018-10-15 MED ORDER — ALPRAZOLAM 0.25 MG PO TABS: 0.2500 mg | ORAL_TABLET | Freq: Three times a day (TID) | ORAL | 1 refills | Status: AC | PRN

## 2018-10-15 NOTE — Patient Instructions (Signed)
  Tests ordered today. Your results will be released to [facility] (or called to you) after review, usually within 72hours after test completion. If any changes need to be made, you will be notified at that same time.   Medications reviewed and updated.  Changes include :   none  Your prescription(s) have been submitted to your pharmacy. Please take as directed and contact our office if you believe you are having problem(s) with the medication(s).

## 2018-10-15 NOTE — Assessment & Plan Note (Signed)
UTI discovered emergency room-she was asymptomatic Treated with antibiotics Remains asymptomatic No need to check urinalysis/culture

## 2018-10-15 NOTE — Assessment & Plan Note (Signed)
We will continue Xanax She does not take 3 times a day, but will give her enough that she can take up to 3 times a day if needed Refill today

## 2018-10-15 NOTE — Telephone Encounter (Signed)
Spoke with Linda Kelly with [facility] to give verbal order for PT per MD. Durward Fortes pt has appt with Dr Quay Burow today and would discuss other.

## 2018-10-15 NOTE — Assessment & Plan Note (Signed)
Had UTI Completed antibiotics while in the hospital

## 2018-10-15 NOTE — Assessment & Plan Note (Signed)
Improved with fluids while in the hospital Eating a drinking at home cmp today

## 2018-10-15 NOTE — Patient Outreach (Signed)
[facility]) Care Management   10/15/2018  Linda Kelly 1928-03-03 [id number]  Linda Kelly is an 84 y.o. female  Subjective: Pt recently hospitalized for UTI and CHF. Discharged on 10/11/18.  Objective:  BP 110/70   Pulse 87   Resp 20   Wt 132 lb (59.9 kg)   SpO2 97% Comment: On 5L  BMI 27.60 kg/m   Review of Systems  Constitutional: Negative.   HENT: Negative.   Eyes:       Watery eyes.  Respiratory: Positive for shortness of breath.        SOB with minimal exertion.  Cardiovascular: Negative.   Gastrointestinal: Negative.   Genitourinary: Negative.   Musculoskeletal: Positive for falls.  Neurological: Positive for dizziness and weakness.  Endo/Heme/Allergies: Bruises/bleeds easily.  Psychiatric/Behavioral: Negative.     Physical Exam  Constitutional: She is oriented to person, place, and time. She appears well-developed and well-nourished.  Cardiovascular: Normal rate, regular rhythm and normal heart sounds.  Respiratory: Effort normal and breath sounds normal.  Effort normal at rest. Wearing O2 at 5L.  Neurological: She is alert and oriented to person, place, and time.  Skin:  Bruising noted on arms.  Psychiatric: She has a normal mood and affect.  Patient was recently discharged from hospital and all medications have been reviewed.  Encounter Medications:   Outpatient Encounter Medications as of 10/15/2018  Medication Sig Note  . acetaminophen (TYLENOL) 650 MG CR tablet Take 650 mg by mouth daily.   Marland Kitchen ALPRAZolam (XANAX) 0.25 MG tablet Take 1 tablet (0.25 mg total) by mouth 3 (three) times daily as needed for anxiety.   Marland Kitchen aspirin EC 81 MG tablet Take 81 mg by mouth at bedtime.    . cholecalciferol (VITAMIN D) 1000 UNITS tablet Take 1,000 Units by mouth at bedtime.    . clopidogrel (PLAVIX) 75 MG tablet TAKE 1 TABLET BY MOUTH EVERY DAY (Patient taking differently: Take 75 mg by mouth daily. ) 10/06/2018: LF 08-25-18 90DS  . ezetimibe (ZETIA) 10  MG tablet Take 1 tablet (10 mg total) by mouth daily. 10/06/2018: LF 08-07-18 90DS  . famotidine (PEPCID) 20 MG tablet Take 1 tablet (20 mg total) by mouth 2 (two) times daily. 10/06/2018: LF 09-18-18 90DS  . furosemide (LASIX) 40 MG tablet TAKE 1 TABLET BY MOUTH TWICE A DAY   . ipratropium-albuterol (DUONEB) 0.5-2.5 (3) MG/3ML SOLN Take 3 mLs by nebulization every 4 (four) hours as needed. (Patient taking differently: Take 3 mLs by nebulization every 4 (four) hours as needed (shortness of breath). ) 10/06/2018: LF 09-24-18 30DS  . isosorbide mononitrate (IMDUR) 30 MG 24 hr tablet Take 1 tablet (30 mg total) by mouth daily.   . metoprolol succinate (TOPROL-XL) 25 MG 24 hr tablet Take 0.5 tablets (12.5 mg total) by mouth daily.   . montelukast (SINGULAIR) 10 MG tablet TAKE 1 TABLET BY MOUTH EVERYDAY AT BEDTIME (Patient taking differently: Take 10 mg by mouth at bedtime. ) 10/06/2018: LF 09-29-18 90DS  . nitroGLYCERIN (NITROSTAT) 0.4 MG SL tablet DISSOLVE 1 TABLET UNDER THE TONGUE EVERY 5 MINUTES FOR 3 DOSES AS NEEDED FOR CHEST PAIN. (Patient taking differently: Place 0.4 mg under the tongue every 5 (five) minutes as needed for chest pain. ) 10/06/2018: LF 09-22-18 25DS  . Omega-3 Fatty Acids (FISH OIL) 1000 MG CAPS Take 1,000 mg by mouth at bedtime.    . potassium chloride (K-DUR) 10 MEQ tablet TAKE 1/2 TABLET ONCE DAILY (Patient taking differently: Take 5 mEq  by mouth daily. ) 10/06/2018: LF 09-22-18 90DS  . predniSONE (DELTASONE) 10 MG tablet Take 4 tablets (40 mg total) by mouth daily with breakfast for 3 days, THEN 3 tablets (30 mg total) daily with breakfast for 5 days, THEN 2 tablets (20 mg total) daily with breakfast for 5 days, THEN 1 tablet (10 mg total) daily with breakfast for 5 days.   Marland Kitchen sertraline (ZOLOFT) 50 MG tablet TAKE 1 TABLET BY MOUTH EVERY DAY (Patient taking differently: Take 50 mg by mouth daily. ) 10/06/2018: LF 08-12-18 90DS  . simvastatin (ZOCOR) 40 MG tablet TAKE 1 TABLET BY MOUTH EVERY  DAY (Patient taking differently: Take 40 mg by mouth daily at 6 PM. ) 10/06/2018: LF 10-02-18 90DS  . vitamin E 400 UNIT capsule Take 400 Units by mouth at bedtime.     No facility-administered encounter medications on file as of 10/15/2018.     Functional Status:   In your present state of health, do you have any difficulty performing the following activities: 10/03/2018 07/30/2018  Hearing? N Y  Vision? N N  Difficulty concentrating or making decisions? N N  Walking or climbing stairs? N Y  Dressing or bathing? N Y  Doing errands, shopping? Linda Kelly  Preparing Food and eating ? - Y  Using the Toilet? - N  In the past six months, have you accidently leaked urine? - N  Do you have problems with loss of bowel control? - N  Managing your Medications? - N  Managing your Finances? - N  Housekeeping or managing your Housekeeping? - Y  Some recent data might be hidden   Fall Risk  10/15/2018 07/30/2018 07/30/2018 01/15/2017 03/27/2016  Falls in the past year? 1 Yes Yes No Yes  Number falls in past yr: 1 2 or more 2 or more - 1  Injury with Fall? 1 Yes Yes - Yes  Comment Stayed on the floor for about an hour, some bruises. - - - -  Risk Factor Category  - High Fall Risk - - High Fall Risk  Risk for fall due to : History of fall(s);Impaired balance/gait;Impaired mobility;Medication side effect Impaired mobility;Impaired balance/gait Impaired balance/gait - History of fall(s)  Follow up Falls evaluation completed Education provided;Falls prevention discussed - - -   Depression screen [facility] 2/9 10/15/2018 07/30/2018 07/30/2018 07/30/2018 01/15/2017  Decreased Interest 0 1 0 0 0  Down, Depressed, Hopeless 0 1 0 - 0  PHQ - 2 Score 0 2 0 0 0  Altered sleeping - 1 0 - -  Tired, decreased energy - 2 0 - -  Change in appetite - 0 0 - -  Feeling bad or failure about yourself  - 1 0 - -  Trouble concentrating - 0 0 - -  Moving slowly or fidgety/restless - 0 0 - -  Suicidal thoughts - 0 0 - -  PHQ-9 Score - 6 0  - -  Difficult doing work/chores - Not difficult at all - - -  Some recent data might be hidden    Assessment:  Resolved UTI                         CHF stable                         Respiratory Failure stable  Plan: I will call pt weekly for a month and see her again in 2 weeks.  THN  CM Care Plan Problem One     Most Recent Value  Care Plan Problem One  Multiple co-morbidities  Role Documenting the Problem One  Care Management [place] for Problem One  Active  THN Long Term Goal   Pt will not be hospitalized in the next 45 days for HF.  THN Long Term Goal Start Date  10/15/18  Interventions for Problem One Long Term Goal  Discussed daily HF maintenance, HF Action Plan, emphasized no added salt as this seems to be a problem area. Edcuated on rational for low na diet.  THN CM Short Term Goal #1   Pt will demonstrate daily weights on her calendar over the next 30 days.  THN CM Short Term Goal #1 Start Date  10/15/18  Interventions for Short Term Goal #1  Verified that pt has scale, takes her medications, and knows what to do for any problems.  THN CM Short Term Goal #2   Pt will verbalize at the end of 30 days that she is paying more attention to avoiding salt in her diet and give NP examples.  THN CM Short Term Goal #2 Start Date  10/15/18  Interventions for Short Term Goal #2  Gave Smart Diet Choices visual guide for low vs high na foods and reading food labels. Described physiological reason to avoid salt.  THN CM Short Term Goal #3  Pt will call NP if she goes into the [facility] for discussion and instructions during the next 30 days.    THN CM Care Plan Problem Two     Most Recent Value  Care Plan Problem Two  No MOST form in the home. Pt does not want to have CPR.  Role Documenting the Problem Two  Care Management Coordinator  Care Plan for Problem Two  Active  THN CM Short Term Goal #1   Pt will complete a MOST form today and post on her wall close to entry door.   THN CM Short Term Goal #1 Start Date  10/15/18  [facility] CM Short Term Goal #1 Met Date   10/15/18  Interventions for Short Term Goal #2   Introduced form and important of having it. Completed document.     [name]. Myrtie Neither, MSN, [facility] Gerontological Nurse Practitioner [facility] Care Management [phone]

## 2018-10-15 NOTE — Assessment & Plan Note (Signed)
BP controlled Current regimen effective and well tolerated Continue current medications at current doses Monitor of hypotension

## 2018-10-15 NOTE — Assessment & Plan Note (Signed)
Has improved slightly since being discharged Will with chronic SOB desat's with ambulation - may need more than 5 L with ambulation - will discuss with pulmonary tomorrow On prednisone taper - prolonged taper Using inhalers/ nebs

## 2018-10-15 NOTE — Assessment & Plan Note (Signed)
Resolved in hospital, possibly related to UTI

## 2018-10-16 ENCOUNTER — Telehealth: Payer: Self-pay | Admitting: Cardiology

## 2018-10-16 ENCOUNTER — Ambulatory Visit (INDEPENDENT_AMBULATORY_CARE_PROVIDER_SITE_OTHER): Payer: PPO | Admitting: Primary Care

## 2018-10-16 ENCOUNTER — Encounter: Payer: Self-pay | Admitting: Primary Care

## 2018-10-16 VITALS — BP 114/70 | HR 85 | Temp 97.8°F | Ht <= 58 in | Wt 137.0 lb

## 2018-10-16 DIAGNOSIS — J449 Chronic obstructive pulmonary disease, unspecified: Secondary | ICD-10-CM

## 2018-10-16 DIAGNOSIS — J9611 Chronic respiratory failure with hypoxia: Secondary | ICD-10-CM | POA: Diagnosis not present

## 2018-10-16 DIAGNOSIS — J84112 Idiopathic pulmonary fibrosis: Secondary | ICD-10-CM | POA: Diagnosis not present

## 2018-10-16 DIAGNOSIS — R040 Epistaxis: Secondary | ICD-10-CM

## 2018-10-16 DIAGNOSIS — J8410 Pulmonary fibrosis, unspecified: Secondary | ICD-10-CM | POA: Diagnosis not present

## 2018-10-16 MED ORDER — PREDNISONE 5 MG PO TABS: 5.0000 mg | ORAL_TABLET | Freq: Every day | ORAL | 0 refills | Status: DC

## 2018-10-16 NOTE — Telephone Encounter (Signed)
New message    Clair Gulling Physical Therapist with [facility] is calling about pt having nose bleeds and being on plavix. He said pt states that they have increased since last visit to hospital. Pt pulmonologist Dr. Annamaria Boots said to increase mositure. Please call.

## 2018-10-16 NOTE — Patient Instructions (Addendum)
  Oxygen needs: You unfortunately can not use POC for your oxygen need Need continuous oxygen 5-6L with exertion and 2-3L on rest  Monitor oxygen saturation with pulse oximeter, keep O2 >90%   Pulmonary fibrosis/Chronic respiratory failure: Stay on 5mg  prednisone daily x3 weeks after taper completed Use Duoneb 2-3 times a day for sob/wheezing   Epistaxis (nose bleeds): Use nasal saline gel twice daily  Use Oxymizer with oxygen  Referral: Pulmonary rehab RC:VELFYBOFB fibrosis  Needs oxymizer with DME company   Follow-up: Next apt with Dr. Annamaria Boots March 26th at 10:30am

## 2018-10-16 NOTE — Telephone Encounter (Signed)
At last office visit, plavix was discontinued, patient made aware and also physical therapist

## 2018-10-16 NOTE — Assessment & Plan Note (Addendum)
-   CXR 12/11 showed no acute abnormality, extensive chronic interstitial lung disease  - Refer to pulmonary rehab re: pulmonary fibrosis, COPD and deconditioning

## 2018-10-16 NOTE — Assessment & Plan Note (Addendum)
-   Failed POC ambulation test today. Unfortunately patient can not safely use pulsed O2, she needs 5-6L continuous flow on exertion and 2-3L with rest - Monitor oxygen saturation with pulse oximeter, keep O2 >90% - Needs Oxymizer for high flow oxygen

## 2018-10-16 NOTE — Progress Notes (Signed)
@Patient  ID: Linda Kelly, female    DOB: 09/27/1928, 84 y.o.   MRN: [id number]  Chief Complaint  Patient presents with  . Follow-up    O2 sats drop with movement to 86, dry hacking cough seldom,     Referring provider: Binnie Rail, MD  HPI: 84 year old female, former smoker quit 2015 (30 pack years). PMH COPD mixed ttype, pulmonary fibrosis, ILD/UIP ([place] dust exposure), chronic respiratory failure with hypoxia (O2 4-5L/m). Patient of Dr. Annamaria Boots, last seen 07/23/18. Pulmonary fibosis slowly progressing, consistent with UIP. She has opted not to treat.   10/17/2018 Patient presents today for oxygen desaturations on 5L with ambulation. Accompanied by her daughter today. States that she uses 5L of oxygen at rest and on exertion. Notices O2 desaturates into the 80's occasionally with walking, states that she recovers fast. Reports that leaving the house is a big production and she finds herself in bed for the rest of the day and next d/t fatigue. Weight has been stable, reports consistently maintains between 132-135lbs. She weighs 137 today d/t clothing and heels. Takes lasix 20mg  twice daily and states that she does not miss a dose. Reports bloody nose with oxygen use. Has humidification with concentrator at home. She seldom has cough or pain. Denies wheezing.   Significant Testing Reviewed: CT chest 01/04/15- the pattern is most compatible with usual  interstitial pneumonia (UIP) CT chest 11/28/2016- Continued progression of fibrotic interstitial lung disease CXR 05/15/2018- Cardiomegaly.Chronic interstitial lung disease. Slight increase in interstitial prominence suggesting the possibility of the presence of a mild component of CHF or pneumonitis  No Known Allergies  Immunization History  Administered Date(s) Administered  . Influenza Split 07/30/2011, 07/29/2012, 08/05/2013  . Influenza Whole 10/02/2007, 08/04/2009, 07/29/2010  . Influenza, High Dose Seasonal  PF 08/01/2016, 07/19/2017, 07/30/2018  . Influenza,inj,Quad PF,6+ Mos 06/29/2014, 07/14/2015  . Pneumococcal Conjugate-13 07/14/2015  . Pneumococcal Polysaccharide-23 10/29/2002, 08/06/2013  . Zoster 06/29/2013    Past Medical History:  Diagnosis Date  . Anxiety   . Anxiety and depression   . Arthritis   . Benign neoplasm of esophagus   . CAD (coronary artery disease)   . Cancer [facility])    Skin cancer on back  . Carotid artery disease ([place])   . CHF (congestive heart failure) ([place])   . Chronic gastritis   . Colon, diverticulosis   . COPD (chronic obstructive pulmonary disease) ([place])   . Depression   . GERD (gastroesophageal reflux disease)   . Hiatal hernia   . High frequency hearing loss   . Hyperlipidemia   . IBS (irritable bowel syndrome)   . Idiopathic pulmonary fibrosis   . Internal hemorrhoid   . MI (myocardial infarction) ([place]) 1985  . Other nonthrombocytopenic purpuras   . PONV (postoperative nausea and vomiting)   . PVD (peripheral vascular disease) ([place])   . Shortness of breath    with exertion  . Solitary pulmonary nodule   . Stroke [facility])    Hx: of several mini -strokes  . Tinnitus    chronic  . Vertigo     Tobacco History: Social History   Tobacco Use  Smoking Status Former Smoker  . Packs/day: 0.50  . Years: 60.00  . Pack years: 30.00  . Types: Cigarettes  . Last attempt to quit: 02/26/2014  . Years since quitting: 4.6  Smokeless Tobacco Never Used   Counseling given: Not Answered   Outpatient Medications Prior to Visit  Medication Sig Dispense  Refill  . acetaminophen (TYLENOL) 650 MG CR tablet Take 650 mg by mouth daily.    Marland Kitchen ALPRAZolam (XANAX) 0.25 MG tablet Take 1 tablet (0.25 mg total) by mouth 3 (three) times daily as needed for anxiety. 90 tablet 1  . aspirin EC 81 MG tablet Take 81 mg by mouth at bedtime.     . cholecalciferol (VITAMIN D) 1000 UNITS tablet Take 1,000 Units by mouth at bedtime.     Marland Kitchen ezetimibe (ZETIA) 10 MG tablet Take 1  tablet (10 mg total) by mouth daily. 90 tablet 3  . famotidine (PEPCID) 20 MG tablet Take 1 tablet (20 mg total) by mouth 2 (two) times daily. 180 tablet 1  . furosemide (LASIX) 40 MG tablet TAKE 1 TABLET BY MOUTH TWICE A DAY 180 tablet 2  . ipratropium-albuterol (DUONEB) 0.5-2.5 (3) MG/3ML SOLN Take 3 mLs by nebulization every 4 (four) hours as needed. (Patient taking differently: Take 3 mLs by nebulization every 4 (four) hours as needed (shortness of breath). ) 360 mL 12  . metoprolol succinate (TOPROL-XL) 25 MG 24 hr tablet Take 0.5 tablets (12.5 mg total) by mouth daily. 45 tablet 1  . montelukast (SINGULAIR) 10 MG tablet TAKE 1 TABLET BY MOUTH EVERYDAY AT BEDTIME (Patient taking differently: Take 10 mg by mouth at bedtime. ) 90 tablet 1  . nitroGLYCERIN (NITROSTAT) 0.4 MG SL tablet DISSOLVE 1 TABLET UNDER THE TONGUE EVERY 5 MINUTES FOR 3 DOSES AS NEEDED FOR CHEST PAIN. (Patient taking differently: Place 0.4 mg under the tongue every 5 (five) minutes as needed for chest pain. ) 25 tablet 0  . Omega-3 Fatty Acids (FISH OIL) 1000 MG CAPS Take 1,000 mg by mouth at bedtime.     . potassium chloride (K-DUR) 10 MEQ tablet TAKE 1/2 TABLET ONCE DAILY (Patient taking differently: Take 5 mEq by mouth daily. ) 45 tablet 1  . predniSONE (DELTASONE) 10 MG tablet Take 4 tablets (40 mg total) by mouth daily with breakfast for 3 days, THEN 3 tablets (30 mg total) daily with breakfast for 5 days, THEN 2 tablets (20 mg total) daily with breakfast for 5 days, THEN 1 tablet (10 mg total) daily with breakfast for 5 days. 42 tablet 0  . sertraline (ZOLOFT) 50 MG tablet TAKE 1 TABLET BY MOUTH EVERY DAY (Patient taking differently: Take 50 mg by mouth daily. ) 90 tablet 2  . simvastatin (ZOCOR) 40 MG tablet TAKE 1 TABLET BY MOUTH EVERY DAY (Patient taking differently: Take 40 mg by mouth daily at 6 PM. ) 90 tablet 3  . vitamin E 400 UNIT capsule Take 400 Units by mouth at bedtime.     . clopidogrel (PLAVIX) 75 MG tablet  TAKE 1 TABLET BY MOUTH EVERY DAY (Patient taking differently: Take 75 mg by mouth daily. ) 90 tablet 1  . isosorbide mononitrate (IMDUR) 30 MG 24 hr tablet Take 1 tablet (30 mg total) by mouth daily. 30 tablet 2   No facility-administered medications prior to visit.     Review of Systems  Review of Systems  Constitutional: Positive for fatigue. Negative for chills and fever.  HENT: Positive for nosebleeds. Negative for congestion, postnasal drip, sinus pressure, sinus pain and sore throat.   Respiratory: Positive for shortness of breath and stridor. Negative for apnea, cough, choking, chest tightness and wheezing.   Cardiovascular: Negative.   Neurological: Positive for weakness.    Physical Exam  BP 114/70 (BP Location: Right Arm, Cuff Size: Normal)   Pulse 85  Temp 97.8 F (36.6 C)   Ht 4\' 10"  (1.473 m)   Wt 137 lb (62.1 kg)   SpO2 100%   BMI 28.63 kg/m  Physical Exam Constitutional:      Appearance: Normal appearance.  HENT:     Head: Normocephalic and atraumatic.     Nose: Nose normal.     Mouth/Throat:     Mouth: Mucous membranes are moist.     Pharynx: Oropharynx is clear.  Eyes:     Extraocular Movements: Extraocular movements intact.     Pupils: Pupils are equal, round, and reactive to light.  Neck:     Musculoskeletal: Normal range of motion and neck supple.  Cardiovascular:     Rate and Rhythm: Normal rate and regular rhythm.     Comments: No edema Pulmonary:     Effort: Pulmonary effort is normal. No respiratory distress.     Breath sounds: No wheezing.     Comments: Coarse crackles bilateral bases  Musculoskeletal:     Comments: Deconditioned   Skin:    General: Skin is warm and dry.  Neurological:     General: No focal deficit present.     Mental Status: She is alert and oriented to person, place, and time. Mental status is at baseline.  Psychiatric:        Mood and Affect: Mood normal.        Behavior: Behavior normal.        Thought Content:  Thought content normal.        Judgment: Judgment normal.     Lab Results:  CBC    Component Value Date/Time   WBC 24.1 Repeated and verified X2. (HH) 10/15/2018 1427   RBC 4.29 10/15/2018 1427   HGB 13.5 10/15/2018 1427   HGB 11.5 07/31/2018 1531   HCT 41.5 10/15/2018 1427   HCT 34.7 07/31/2018 1531   PLT 321.0 10/15/2018 1427   PLT 253 07/31/2018 1531   MCV 96.8 10/15/2018 1427   MCV 94 07/31/2018 1531   MCH 30.9 110-30-202019 0825   MCHC 32.5 10/15/2018 1427   RDW 16.9 (H) 10/15/2018 1427   RDW 13.8 07/31/2018 1531   LYMPHSABS 1.5 10/15/2018 1427   MONOABS 1.9 (H) 10/15/2018 1427   EOSABS 0.0 10/15/2018 1427   BASOSABS 0.1 10/15/2018 1427    BMET    Component Value Date/Time   NA 139 10/15/2018 1427   NA 141 07/31/2018 1531   K 4.5 10/15/2018 1427   CL 98 10/15/2018 1427   CO2 31 10/15/2018 1427   GLUCOSE 94 10/15/2018 1427   BUN 35 (H) 10/15/2018 1427   BUN 17 07/31/2018 1531   CREATININE 1.01 10/15/2018 1427   CREATININE 1.27 (H) 01/23/2017 1449   CALCIUM 9.0 10/15/2018 1427   GFRNONAA >60 110-30-202019 0825   GFRAA >60 110-30-202019 0825    BNP    Component Value Date/Time   BNP 960.4 (H) 01/03/2017 1117    ProBNP    Component Value Date/Time   PROBNP 162.0 (H) 07/23/2018 1111    Imaging: Ct Abdomen Pelvis Wo Contrast  Result Date: 10/02/2018 CLINICAL DATA:  Abdominal pain, fever, sepsis EXAM: CT ABDOMEN AND PELVIS WITHOUT CONTRAST TECHNIQUE: Multidetector CT imaging of the abdomen and pelvis was performed following the standard protocol without IV contrast. COMPARISON:  None. FINDINGS: Lower chest: Mild cardiomegaly. Dense coronary artery calcifications. Visualized aortic root mildly aneurysmal at 4.1 cm. This is stable since prior chest CT from 11/28/2016. Scarring in the lung bases. Hepatobiliary:  No focal hepatic abnormality. Gallbladder unremarkable. Pancreas: No focal abnormality or ductal dilatation. Spleen: No focal abnormality.  Normal size.  Adrenals/Urinary Tract: No adrenal abnormality. No focal renal abnormality. No stones or hydronephrosis. Urinary bladder is unremarkable. Stomach/Bowel: Left colonic diverticulosis. No active diverticulitis. Vascular/Lymphatic: Aortic atherosclerosis. No enlarged abdominal or pelvic lymph nodes. Reproductive: Uterus and adnexa unremarkable.  No mass. Other: No free fluid or free air. Musculoskeletal: No acute bony abnormality. Degenerative changes in the lumbar spine. IMPRESSION: No acute findings in the abdomen or pelvis. Left colonic diverticulosis.  No active diverticulitis. Aortic atherosclerosis. Mild aneurysmal dilatation of the aortic root, stable since January 2018. Recommend annual imaging followup by CTA or MRA. This recommendation follows 2010 ACCF/AHA/AATS/ACR/ASA/SCA/SCAI/SIR/STS/SVM Guidelines for the Diagnosis and Management of Patients with Thoracic Aortic Disease. Circulation. 2010; 121: X412-I786 Coronary artery disease. Electronically Signed   By: Rolm Baptise M.D.   On: 10/02/2018 21:28   Dg Chest Port 1 View  Result Date: 10/08/2018 CLINICAL DATA:  Acute on chronic respiratory failure with hypoxemia. EXAM: PORTABLE CHEST 1 VIEW COMPARISON:  Chest x-rays dated 10/02/2018 and 07/23/2018 and chest CT dated 11/28/2016 FINDINGS: The heart size and pulmonary vascularity are normal. Tortuosity and calcification of the thoracic aorta. Densely calcified mitral valve annulus. Diffuse chronic interstitial lung disease as previously demonstrated. No infiltrates or effusions. No acute bone abnormality. Bilateral moderate arthritis of the glenohumeral joints. IMPRESSION: No acute abnormalities. Extensive chronic interstitial lung disease. Aortic Atherosclerosis (ICD10-I70.0). Electronically Signed   By: Lorriane Shire M.D.   On: 10/08/2018 11:21   Dg Chest Port 1 View  Result Date: 10/02/2018 CLINICAL DATA:  Shortness of breath. EXAM: PORTABLE CHEST 1 VIEW COMPARISON:  07/23/2018 FINDINGS: Chronic  interstitial lung densities in both lungs, left side greater than right. No new airspace disease or consolidation. Heart size is stable. Atherosclerotic calcifications at the aortic arch. Trachea is midline. Negative for pneumothorax. IMPRESSION: Chronic lung disease is compatible with fibrosis. No acute findings. Electronically Signed   By: Markus Daft M.D.   On: 10/02/2018 19:09     Assessment & Plan:   Chronic respiratory failure with hypoxia (HCC) - Failed POC ambulation test today. Unfortunately patient can not safely use pulsed O2, she needs 5-6L continuous flow on exertion and 2-3L with rest - Monitor oxygen saturation with pulse oximeter, keep O2 >90% - Needs Oxymizer for high flow oxygen   Pulmonary fibrosis ([place]) - CXR 12/11 showed no acute abnormality, extensive chronic interstitial lung disease  - Refer to pulmonary rehab re: pulmonary fibrosis, COPD and deconditioning   Mild epistaxis - Continue humidification with oxygen - Add oxymizer with high flow - Encourage nasal saline gel   COPD mixed type ([place]) - Stay on 5mg  prednisone daily x3 weeks after taper completed - Use Duoneb 2-3 times a day for sob/wheezing     Martyn Ehrich, NP 10/17/2018

## 2018-10-17 ENCOUNTER — Encounter: Payer: Self-pay | Admitting: Primary Care

## 2018-10-17 ENCOUNTER — Telehealth (HOSPITAL_COMMUNITY): Payer: Self-pay

## 2018-10-17 DIAGNOSIS — R040 Epistaxis: Secondary | ICD-10-CM | POA: Insufficient documentation

## 2018-10-17 NOTE — Assessment & Plan Note (Signed)
-   Stay on 5mg  prednisone daily x3 weeks after taper completed - Use Duoneb 2-3 times a day for sob/wheezing

## 2018-10-17 NOTE — Assessment & Plan Note (Signed)
-   Continue humidification with oxygen - Add oxymizer with high flow - Encourage nasal saline gel

## 2018-10-17 NOTE — Telephone Encounter (Signed)
Referral received from MD [place] for Pulmonary Rehab with diagnosis of IPF. Clinical review of pt follow up appt on 10/16/18 Pulmonary office note with Volanda Napoleon NP.   Pt appropriate for scheduling for Pulmonary rehab.  Will forward to support staff for scheduling and verification of insurance eligibility/benefits with pt  consent. Joycelyn Man, RN, BSN Cardiac and Pulmonary Rehab Nurse

## 2018-10-20 ENCOUNTER — Telehealth: Payer: Self-pay | Admitting: Internal Medicine

## 2018-10-20 NOTE — Telephone Encounter (Signed)
Routing to dr crawford, please advise in the absence of dr burns, I will call home health back, thanks

## 2018-10-20 NOTE — Telephone Encounter (Signed)
Advised Kat/home health of dr crawfords ok orders

## 2018-10-20 NOTE — Telephone Encounter (Signed)
Fine

## 2018-10-20 NOTE — Telephone Encounter (Unsigned)
Copied from [place] [id number]. Topic: General - Other >> Oct 20, 2018 11:07 AM Oneta Rack wrote: Caller name: Kat  Relation to pt: OT from [facility]  Call back number: [phone]    Reason for call:  Requesting verbal orders for home health OT 1x 1, please leave detail message.(informed OT PCP is out of the office until 10/28/18) and OT stated she would like to visit patient Monday 10/27/18, please advise

## 2018-10-23 ENCOUNTER — Other Ambulatory Visit: Payer: Self-pay | Admitting: *Deleted

## 2018-10-23 NOTE — Patient Outreach (Signed)
Telephone assessment. Linda Kelly says she is doing well, however, she sounds somewhat breathless over the phone. She is wearing her O2 at 5L and her wt is down 2#.   She has family from all over with her and is enjoying their company.  She has all her meds and is taking them. She is evidently not getting as much na in her diet with her wt down, especially at Christmas.  I will be seeing her again on 10/30/17.  [name]. Myrtie Neither, MSN, [facility] Gerontological Nurse Practitioner [facility] Care Management [phone]

## 2018-10-24 ENCOUNTER — Telehealth: Payer: Self-pay | Admitting: Internal Medicine

## 2018-10-24 ENCOUNTER — Telehealth: Payer: Self-pay | Admitting: Cardiology

## 2018-10-24 NOTE — Telephone Encounter (Signed)
Spoke with jim, the dizziness the patient is having is what she considers her baseline. She reports being hydrated and her urine is light yellow in color. Aware we will make no changes at this time and continue to monitor.

## 2018-10-24 NOTE — Telephone Encounter (Signed)
New Message    Pt c/o BP issue: STAT if pt c/o blurred vision, one-sided weakness or slurred speech  1. What are your last 5 BP readings? Today sitting-100/70, standing-80/70, 12/19 sitting-110/70 standing-100/70, 12/17 sitting 110/70, standing- 90/60  2. Are you having any other symptoms (ex. Dizziness, headache, blurred vision, passed out)? Dizziness when going from seated to standing and laying to seated  3. What is your BP issue? [facility] is [facility] to report pt's bp values and states pt had Chronic Orthostatic Hypertension. Weight remaining  consistent between 130-131

## 2018-10-24 NOTE — Telephone Encounter (Signed)
Jim physical therapist is needing the OV note from the patients last visit to be signed so that he can see it. CY please advise.  I have also contacted the patients daughter at number provided and I was unable to reach anyone. Unable to leave voicemail.

## 2018-10-24 NOTE — Telephone Encounter (Signed)
NP was last note 12/19 and is signed My last ov note from 9/25 is signed

## 2018-10-28 NOTE — Telephone Encounter (Signed)
Attempted to call pt's daughter [name] but unable to reach her and unable to leave a VM. Will try to call back later.

## 2018-10-30 ENCOUNTER — Other Ambulatory Visit: Payer: Self-pay | Admitting: *Deleted

## 2018-10-30 NOTE — Telephone Encounter (Signed)
OV notes have been faxed to [place] at [facility].  Attempted to contact pt's daughter, [name]. I did not receive an answer. I have left a message for her to return our call.

## 2018-10-30 NOTE — Patient Outreach (Signed)
[facility]) Care Management  10/30/2018  Linda Kelly 18-Jul-1928 [id number]   Home visit to follow up on CHF and COPD self management. Pt has not had any problems with increasing sxs over the holidays. Her weight is stable. She has a hard time trying to avoid added salt. She does still experience some nasal bleeding that she has trouble with. The amount of bleeding can vary. She is off PLAVIX at this time. She is on Humidifred O2 5L per minutes. She has nasal saline gel but may not be using it often enough.  Provided [facility] Calendar so she can keep her daily wts, see her CHF and COPD action plans, appointments and notes Reinforced basic maangement.  Advised to use saline gel applying it with a qtip several times a day. Avoid using finger to remove debris. If has severe bleed, pinch nose and bow head to chest. If bleeding doesn't stop go to ED.  I will call her again in one week.  [name]. Linda Neither, MSN, [facility] Gerontological Nurse Practitioner [facility] Care Management [phone]

## 2018-10-31 DIAGNOSIS — J449 Chronic obstructive pulmonary disease, unspecified: Secondary | ICD-10-CM | POA: Diagnosis not present

## 2018-10-31 DIAGNOSIS — J8410 Pulmonary fibrosis, unspecified: Secondary | ICD-10-CM | POA: Diagnosis not present

## 2018-10-31 DIAGNOSIS — I509 Heart failure, unspecified: Secondary | ICD-10-CM | POA: Diagnosis not present

## 2018-10-31 DIAGNOSIS — R0602 Shortness of breath: Secondary | ICD-10-CM | POA: Diagnosis not present

## 2018-10-31 DIAGNOSIS — J9621 Acute and chronic respiratory failure with hypoxia: Secondary | ICD-10-CM | POA: Diagnosis not present

## 2018-10-31 NOTE — Telephone Encounter (Signed)
Attempted to contact pt's daughter, [name]. I did not receive an answer. I have left a message for her to return our call.

## 2018-11-03 NOTE — Telephone Encounter (Signed)
Patient's daughter, [name] returning call, CB is [phone].

## 2018-11-03 NOTE — Telephone Encounter (Signed)
Attempted to call pt's daughter [name] but unable to reach her and unable to leave a VM. Will try to call back later.

## 2018-11-04 ENCOUNTER — Other Ambulatory Visit: Payer: Self-pay | Admitting: Primary Care

## 2018-11-04 NOTE — Telephone Encounter (Signed)
Attempted to contact patients daughter at number provided. Unable to reach and unable to leave voicemail.

## 2018-11-06 ENCOUNTER — Ambulatory Visit (INDEPENDENT_AMBULATORY_CARE_PROVIDER_SITE_OTHER): Payer: HMO | Admitting: Cardiology

## 2018-11-06 ENCOUNTER — Other Ambulatory Visit: Payer: Self-pay | Admitting: *Deleted

## 2018-11-06 DIAGNOSIS — A419 Sepsis, unspecified organism: Secondary | ICD-10-CM | POA: Diagnosis not present

## 2018-11-06 DIAGNOSIS — I255 Ischemic cardiomyopathy: Secondary | ICD-10-CM

## 2018-11-06 DIAGNOSIS — J8410 Pulmonary fibrosis, unspecified: Secondary | ICD-10-CM

## 2018-11-06 DIAGNOSIS — I739 Peripheral vascular disease, unspecified: Secondary | ICD-10-CM

## 2018-11-06 DIAGNOSIS — I214 Non-ST elevation (NSTEMI) myocardial infarction: Secondary | ICD-10-CM | POA: Diagnosis not present

## 2018-11-06 DIAGNOSIS — Z9861 Coronary angioplasty status: Secondary | ICD-10-CM | POA: Diagnosis not present

## 2018-11-06 DIAGNOSIS — R6520 Severe sepsis without septic shock: Secondary | ICD-10-CM

## 2018-11-06 DIAGNOSIS — I2510 Atherosclerotic heart disease of native coronary artery without angina pectoris: Secondary | ICD-10-CM | POA: Diagnosis not present

## 2018-11-06 NOTE — Telephone Encounter (Signed)
Attempted to call Patient. No answer and unable to leave message.

## 2018-11-06 NOTE — Assessment & Plan Note (Signed)
NSTEMI- Dec 2019- Troponin 12- in setting of acute urosepsis

## 2018-11-06 NOTE — Assessment & Plan Note (Signed)
RCAE May 2015 LCE '95, '97, '99 Patent CA sites by doppler April 2018 (Dr Early follows)  Bilateral SCA stenosis (80% RSCA, 90% LSCA) by CT May 2018

## 2018-11-06 NOTE — Assessment & Plan Note (Addendum)
Remote PCI-  Cath Oct 2017-severe CAD, occluded RCA with R-R collaterals from an RV branch, 99% mCFX (tortuous vessel), occluded LAD with L-L collaterals from RI. Not a candidate for PCI or CABG- plan medical Rx

## 2018-11-06 NOTE — Progress Notes (Signed)
11/06/2018 Linda Kelly   July 24, 1928  [id number]  Primary Physician Quay Burow, Claudina Lick, MD Primary Cardiologist: Dr Stanford Breed  HPI:  85 y.o. female with severe multivessel CAD not amenable to revascularization, widespread PVD, chronic systolic and diastolic heart failure (LVEF 40 to 45%), and pulmonary fibrosis on chronic O2 admitted 10/03/18 with severe sepsis secondary to a urinary tract infection.  Cardiology was consulted for elevated troponin. Plan is for continued medical Rx. She is in the office today for follow up.  She denies any chest pain. Her main complaint is bleeding in her nose.  Her pulmonologist has ordered some changes to her home O2 but ist has happened yet ans she is frustrated.     Current Outpatient Medications  Medication Sig Dispense Refill  . acetaminophen (TYLENOL) 650 MG CR tablet Take 650 mg by mouth daily.    Marland Kitchen ALPRAZolam (XANAX) 0.25 MG tablet Take 1 tablet (0.25 mg total) by mouth 3 (three) times daily as needed for anxiety. 90 tablet 1  . aspirin EC 81 MG tablet Take 81 mg by mouth at bedtime.     . cholecalciferol (VITAMIN D) 1000 UNITS tablet Take 1,000 Units by mouth at bedtime.     Marland Kitchen ezetimibe (ZETIA) 10 MG tablet Take 1 tablet (10 mg total) by mouth daily. 90 tablet 3  . famotidine (PEPCID) 20 MG tablet Take 1 tablet (20 mg total) by mouth 2 (two) times daily. 180 tablet 1  . furosemide (LASIX) 40 MG tablet TAKE 1 TABLET BY MOUTH TWICE A DAY 180 tablet 2  . ipratropium-albuterol (DUONEB) 0.5-2.5 (3) MG/3ML SOLN Take 3 mLs by nebulization every 4 (four) hours as needed. (Patient taking differently: Take 3 mLs by nebulization every 4 (four) hours as needed (shortness of breath). ) 360 mL 12  . isosorbide mononitrate (IMDUR) 30 MG 24 hr tablet Take 1 tablet (30 mg total) by mouth daily. 30 tablet 2  . metoprolol succinate (TOPROL-XL) 25 MG 24 hr tablet Take 0.5 tablets (12.5 mg total) by mouth daily. 45 tablet 1  . montelukast (SINGULAIR) 10 MG tablet TAKE 1  TABLET BY MOUTH EVERYDAY AT BEDTIME (Patient taking differently: Take 10 mg by mouth at bedtime. ) 90 tablet 1  . nitroGLYCERIN (NITROSTAT) 0.4 MG SL tablet DISSOLVE 1 TABLET UNDER THE TONGUE EVERY 5 MINUTES FOR 3 DOSES AS NEEDED FOR CHEST PAIN. (Patient taking differently: Place 0.4 mg under the tongue every 5 (five) minutes as needed for chest pain. ) 25 tablet 0  . Omega-3 Fatty Acids (FISH OIL) 1000 MG CAPS Take 1,000 mg by mouth at bedtime.     . potassium chloride (K-DUR) 10 MEQ tablet TAKE 1/2 TABLET ONCE DAILY (Patient taking differently: Take 5 mEq by mouth daily. ) 45 tablet 1  . predniSONE (DELTASONE) 5 MG tablet Take 1 tablet (5 mg total) by mouth daily with breakfast. After prednisone taper completed, start 5mg  daily x 3 weeks 21 tablet 0  . sertraline (ZOLOFT) 50 MG tablet TAKE 1 TABLET BY MOUTH EVERY DAY (Patient taking differently: Take 50 mg by mouth daily. ) 90 tablet 2  . simvastatin (ZOCOR) 40 MG tablet TAKE 1 TABLET BY MOUTH EVERY DAY (Patient taking differently: Take 40 mg by mouth daily at 6 PM. ) 90 tablet 3  . vitamin E 400 UNIT capsule Take 400 Units by mouth at bedtime.      No current facility-administered medications for this visit.     No Known Allergies  Past Medical  History:  Diagnosis Date  . Anxiety   . Anxiety and depression   . Arthritis   . Benign neoplasm of esophagus   . CAD (coronary artery disease)   . Cancer [facility])    Skin cancer on back  . Carotid artery disease ([place])   . CHF (congestive heart failure) ([place])   . Chronic gastritis   . Colon, diverticulosis   . COPD (chronic obstructive pulmonary disease) ([employer])   . Depression   . GERD (gastroesophageal reflux disease)   . Hiatal hernia   . High frequency hearing loss   . Hyperlipidemia   . IBS (irritable bowel syndrome)   . Idiopathic pulmonary fibrosis   . Internal hemorrhoid   . MI (myocardial infarction) ([place]) 1985  . Other nonthrombocytopenic purpuras   . PONV (postoperative nausea and  vomiting)   . PVD (peripheral vascular disease) ([place])   . Shortness of breath    with exertion  . Solitary pulmonary nodule   . Stroke [facility])    Hx: of several mini -strokes  . Tinnitus    chronic  . Vertigo     Social History   Socioeconomic History  . Marital status: Widowed    Spouse name: Not on file  . Number of children: 6  . Years of education: Not on file  . Highest education level: Not on file  Occupational History  . Occupation: Armed forces operational officer at [employer]: [place]  . Financial resource strain: Not hard at all  . Food insecurity:    Worry: Never true    Inability: Never true  . Transportation needs:    Medical: No    Non-medical: No  Tobacco Use  . Smoking status: Former Smoker    Packs/day: 0.50    Years: 60.00    Pack years: 30.00    Types: Cigarettes    Last attempt to quit: 02/26/2014    Years since quitting: 4.6  . Smokeless tobacco: Never Used  Substance and Sexual Activity  . Alcohol use: No  . Drug use: No  . Sexual activity: Never  Lifestyle  . Physical activity:    Days per week: 0 days    Minutes per session: 0 min  . Stress: Only a little  Relationships  . Social connections:    Talks on phone: More than three times a week    Gets together: More than three times a week    Attends religious service: 1 to 4 times per year    Active member of club or organization: Not on file    Attends meetings of clubs or organizations: 1 to 4 times per year    Relationship status: Widowed  . Intimate partner violence:    Fear of current or ex partner: No    Emotionally abused: No    Physically abused: No    Forced sexual activity: No  Other Topics Concern  . Not on file  Social History Narrative   [place] to [place] twice a year to visit sister- 3-4 weeks travel there every year.   Pt does not get regular exercise     Family History  Problem Relation Age of Onset  . Heart  attack Father        deceased at 38, MGF  . Colon cancer Father   . Prostate cancer Father   . Heart disease Father   . Hyperlipidemia Father   .  Heart disease Mother        deaceased at age 36  . Colitis Mother   . Stroke Mother   . Hyperlipidemia Mother   . Colitis Sister   . Hyperlipidemia Sister   . Other Brother        deceased age 74; war  . Hyperlipidemia Brother   . Breast cancer Maternal Aunt        great   . Lung cancer Paternal Aunt        great  . Vasculitis Son   . Hyperlipidemia Son   . Hypertension Son   . Heart attack Son   . Cancer Sister        unknown  . Hyperlipidemia Sister   . Hyperlipidemia Daughter   . Hypertension Daughter   . COPD Neg Hx   . Asthma Neg Hx      Review of Systems: General: negative for chills, fever, night sweats or weight changes.  Cardiovascular: negative for chest pain, dyspnea on exertion, edema, orthopnea, palpitations, paroxysmal nocturnal dyspnea or shortness of breath Dermatological: negative for rash Respiratory: negative for cough or wheezing Urologic: negative for hematuria Abdominal: negative for nausea, vomiting, diarrhea, bright red blood per rectum, melena, or hematemesis Neurologic: negative for visual changes, syncope, or dizziness All other systems reviewed and are otherwise negative except as noted above.    Blood pressure 92/61, pulse 88, height 4\' 10"  (1.473 m), weight 141 lb (64 kg), SpO2 100 %.  General appearance: alert, cooperative, no distress and chronically ill apearring, in wheel chair, on O2 Lungs: kyphosis, decreased breath sounds Heart: regular rate and rhythm Extremities: no edema Skin: pale, dry Neurologic: Grossly normal   ASSESSMENT AND PLAN:   NSTEMI myocardial infarction [facility]) NSTEMI- Dec 2019- Troponin 12- in setting of acute urosepsis  Pulmonary fibrosis ([place]) O2 at home  Ischemic cardiomyopathy EF 40-45% by echo Oct 2017  PVD (peripheral vascular disease) ([place]) RCAE May  2015 LCE '95, '97, '99 Patent CA sites by doppler April 2018 (Dr Early follows)  Bilateral SCA stenosis (80% RSCA, 90% LSCA) by CT May 2018  Severe sepsis [facility]) Admitted with urosepsis Dec 3329- complicated by AKI and NSTEMI  CAD S/P percutaneous coronary angioplasty 1985 Remote PCI-  Cath Oct 2017-severe CAD, occluded RCA with R-R collaterals from an RV branch, 99% mCFX (tortuous vessel), occluded LAD with L-L collaterals from RI. Not a candidate for PCI or CABG- plan medical Rx   PLAN  Continue medical Rx. F/U with Dr Casimer Bilis PA-C 11/06/2018 2:35 PM

## 2018-11-06 NOTE — Assessment & Plan Note (Signed)
Admitted with urosepsis Dec 6429- complicated by AKI and NSTEMI

## 2018-11-06 NOTE — Assessment & Plan Note (Signed)
O2 at home

## 2018-11-06 NOTE — Patient Outreach (Signed)
Transition of care. [name] reports she is doing fair. Her wt today is 133 which is in a 3# normal range for her. She is no more SOB than ususal. She SAYS she is omitting salt. She has all her meds and is taking them as prescribed. She is going to her cardiology office today.  I started to talk to [name] about a new program that [employer] has launched this year, called CSNP for special populations with chronic disease. She reports her insurance agent has already called and and signed her up. I advised I think this will be very advantageous to her.  Reminded pt she can call me or her provider if she gets in to the [facility] for recommendations on actions and self treatment to reduce her sxs.  I will call her again in 2 weeks.  Linda Kelly. Linda Neither, MSN, [facility] Gerontological Nurse Practitioner [facility] Care Management [phone]

## 2018-11-06 NOTE — Patient Instructions (Signed)
Medication Instructions:  Your physician recommends that you continue on your current medications as directed. Please refer to the Current Medication list given to you today. If you need a refill on your cardiac medications before your next appointment, please call your pharmacy.   Lab work: None  If you have labs (blood work) drawn today and your tests are completely normal, you will receive your results only by: Marland Kitchen MyChart Message (if you have MyChart) OR . A paper copy in the mail If you have any lab test that is abnormal or we need to change your treatment, we will call you to review the results.  Testing/Procedures: None   Follow-Up: At [facility], you and your health needs are our priority.  As part of our continuing mission to provide you with exceptional heart care, we have created designated Provider Care Teams.  These Care Teams include your primary Cardiologist (physician) and Advanced Practice Providers (APPs -  Physician Assistants and Nurse Practitioners) who all work together to provide you with the care you need, when you need it.  Your physician recommends that you schedule a follow-up appointment in: 3 months with Dr Stanford Breed.  Any Other Special Instructions Will Be Listed Below (If Applicable).

## 2018-11-06 NOTE — Assessment & Plan Note (Signed)
EF 40-45% by echo Oct 2017

## 2018-11-07 ENCOUNTER — Telehealth (HOSPITAL_COMMUNITY): Payer: Self-pay

## 2018-11-07 NOTE — Telephone Encounter (Signed)
Attempted to call pt's daughter [name] but unable to reach her and unable to leave a VM. Due to multiple attempts trying to reach pt/janet with unable to do so, per triage protocol encounter will be closed.

## 2018-11-07 NOTE — Telephone Encounter (Signed)
Spoke with pt about home PT/OT. Pt states she will finish in a "couple of weeks." Pt also states that she will have to check with her daughter about availability d/t daughter transports her and helps her with her oxygen. Will follow up with pt in a couple weeks.  Joycelyn Man RN, BSN Cardiac and Pulmonary Rehab RN

## 2018-11-10 ENCOUNTER — Inpatient Hospital Stay (HOSPITAL_COMMUNITY)
Admission: EM | Admit: 2018-11-10 | Discharge: 2018-11-19 | DRG: 189 | Disposition: A | Discharge status: Skilled Nursing Facility | Payer: PPO | Attending: Internal Medicine | Admitting: Internal Medicine

## 2018-11-10 ENCOUNTER — Encounter (HOSPITAL_COMMUNITY): Payer: Self-pay | Admitting: Emergency Medicine

## 2018-11-10 ENCOUNTER — Other Ambulatory Visit: Payer: Self-pay

## 2018-11-10 ENCOUNTER — Emergency Department (HOSPITAL_COMMUNITY): Payer: PPO

## 2018-11-10 DIAGNOSIS — R778 Other specified abnormalities of plasma proteins: Secondary | ICD-10-CM | POA: Diagnosis present

## 2018-11-10 DIAGNOSIS — I214 Non-ST elevation (NSTEMI) myocardial infarction: Secondary | ICD-10-CM | POA: Diagnosis not present

## 2018-11-10 DIAGNOSIS — J9811 Atelectasis: Secondary | ICD-10-CM | POA: Diagnosis present

## 2018-11-10 DIAGNOSIS — Z79899 Other long term (current) drug therapy: Secondary | ICD-10-CM

## 2018-11-10 DIAGNOSIS — H9190 Unspecified hearing loss, unspecified ear: Secondary | ICD-10-CM | POA: Diagnosis present

## 2018-11-10 DIAGNOSIS — M2550 Pain in unspecified joint: Secondary | ICD-10-CM | POA: Diagnosis not present

## 2018-11-10 DIAGNOSIS — I2510 Atherosclerotic heart disease of native coronary artery without angina pectoris: Secondary | ICD-10-CM | POA: Diagnosis not present

## 2018-11-10 DIAGNOSIS — R278 Other lack of coordination: Secondary | ICD-10-CM | POA: Diagnosis not present

## 2018-11-10 DIAGNOSIS — I252 Old myocardial infarction: Secondary | ICD-10-CM

## 2018-11-10 DIAGNOSIS — E785 Hyperlipidemia, unspecified: Secondary | ICD-10-CM | POA: Diagnosis not present

## 2018-11-10 DIAGNOSIS — Z9861 Coronary angioplasty status: Secondary | ICD-10-CM | POA: Diagnosis not present

## 2018-11-10 DIAGNOSIS — I248 Other forms of acute ischemic heart disease: Secondary | ICD-10-CM | POA: Diagnosis present

## 2018-11-10 DIAGNOSIS — M1990 Unspecified osteoarthritis, unspecified site: Secondary | ICD-10-CM | POA: Diagnosis present

## 2018-11-10 DIAGNOSIS — R42 Dizziness and giddiness: Secondary | ICD-10-CM | POA: Diagnosis not present

## 2018-11-10 DIAGNOSIS — Z7982 Long term (current) use of aspirin: Secondary | ICD-10-CM

## 2018-11-10 DIAGNOSIS — I110 Hypertensive heart disease with heart failure: Secondary | ICD-10-CM | POA: Diagnosis not present

## 2018-11-10 DIAGNOSIS — I959 Hypotension, unspecified: Secondary | ICD-10-CM | POA: Diagnosis not present

## 2018-11-10 DIAGNOSIS — J84112 Idiopathic pulmonary fibrosis: Secondary | ICD-10-CM | POA: Diagnosis not present

## 2018-11-10 DIAGNOSIS — J9611 Chronic respiratory failure with hypoxia: Secondary | ICD-10-CM | POA: Diagnosis present

## 2018-11-10 DIAGNOSIS — Z9981 Dependence on supplemental oxygen: Secondary | ICD-10-CM

## 2018-11-10 DIAGNOSIS — Z8349 Family history of other endocrine, nutritional and metabolic diseases: Secondary | ICD-10-CM

## 2018-11-10 DIAGNOSIS — Z7401 Bed confinement status: Secondary | ICD-10-CM | POA: Diagnosis not present

## 2018-11-10 DIAGNOSIS — R6520 Severe sepsis without septic shock: Secondary | ICD-10-CM

## 2018-11-10 DIAGNOSIS — Z87891 Personal history of nicotine dependence: Secondary | ICD-10-CM

## 2018-11-10 DIAGNOSIS — J8410 Pulmonary fibrosis, unspecified: Secondary | ICD-10-CM | POA: Diagnosis present

## 2018-11-10 DIAGNOSIS — E669 Obesity, unspecified: Secondary | ICD-10-CM | POA: Diagnosis present

## 2018-11-10 DIAGNOSIS — F329 Major depressive disorder, single episode, unspecified: Secondary | ICD-10-CM | POA: Diagnosis present

## 2018-11-10 DIAGNOSIS — R7989 Other specified abnormal findings of blood chemistry: Secondary | ICD-10-CM | POA: Diagnosis not present

## 2018-11-10 DIAGNOSIS — K589 Irritable bowel syndrome without diarrhea: Secondary | ICD-10-CM | POA: Diagnosis present

## 2018-11-10 DIAGNOSIS — Z801 Family history of malignant neoplasm of trachea, bronchus and lung: Secondary | ICD-10-CM

## 2018-11-10 DIAGNOSIS — I5042 Chronic combined systolic (congestive) and diastolic (congestive) heart failure: Secondary | ICD-10-CM | POA: Diagnosis not present

## 2018-11-10 DIAGNOSIS — Z7952 Long term (current) use of systemic steroids: Secondary | ICD-10-CM

## 2018-11-10 DIAGNOSIS — J439 Emphysema, unspecified: Secondary | ICD-10-CM | POA: Diagnosis not present

## 2018-11-10 DIAGNOSIS — K219 Gastro-esophageal reflux disease without esophagitis: Secondary | ICD-10-CM | POA: Diagnosis not present

## 2018-11-10 DIAGNOSIS — M40209 Unspecified kyphosis, site unspecified: Secondary | ICD-10-CM | POA: Diagnosis present

## 2018-11-10 DIAGNOSIS — N179 Acute kidney failure, unspecified: Secondary | ICD-10-CM | POA: Diagnosis present

## 2018-11-10 DIAGNOSIS — R0602 Shortness of breath: Secondary | ICD-10-CM

## 2018-11-10 DIAGNOSIS — Z803 Family history of malignant neoplasm of breast: Secondary | ICD-10-CM

## 2018-11-10 DIAGNOSIS — A419 Sepsis, unspecified organism: Secondary | ICD-10-CM

## 2018-11-10 DIAGNOSIS — Z8042 Family history of malignant neoplasm of prostate: Secondary | ICD-10-CM

## 2018-11-10 DIAGNOSIS — F0390 Unspecified dementia without behavioral disturbance: Secondary | ICD-10-CM | POA: Diagnosis present

## 2018-11-10 DIAGNOSIS — I21A1 Myocardial infarction type 2: Secondary | ICD-10-CM | POA: Diagnosis not present

## 2018-11-10 DIAGNOSIS — J9621 Acute and chronic respiratory failure with hypoxia: Principal | ICD-10-CM | POA: Diagnosis present

## 2018-11-10 DIAGNOSIS — Z6829 Body mass index (BMI) 29.0-29.9, adult: Secondary | ICD-10-CM

## 2018-11-10 DIAGNOSIS — R079 Chest pain, unspecified: Secondary | ICD-10-CM | POA: Diagnosis not present

## 2018-11-10 DIAGNOSIS — I739 Peripheral vascular disease, unspecified: Secondary | ICD-10-CM | POA: Diagnosis not present

## 2018-11-10 DIAGNOSIS — F321 Major depressive disorder, single episode, moderate: Secondary | ICD-10-CM | POA: Diagnosis not present

## 2018-11-10 DIAGNOSIS — H9319 Tinnitus, unspecified ear: Secondary | ICD-10-CM | POA: Diagnosis present

## 2018-11-10 DIAGNOSIS — K449 Diaphragmatic hernia without obstruction or gangrene: Secondary | ICD-10-CM | POA: Diagnosis present

## 2018-11-10 DIAGNOSIS — R41841 Cognitive communication deficit: Secondary | ICD-10-CM | POA: Diagnosis not present

## 2018-11-10 DIAGNOSIS — K5730 Diverticulosis of large intestine without perforation or abscess without bleeding: Secondary | ICD-10-CM | POA: Diagnosis not present

## 2018-11-10 DIAGNOSIS — Z85828 Personal history of other malignant neoplasm of skin: Secondary | ICD-10-CM | POA: Diagnosis not present

## 2018-11-10 DIAGNOSIS — Z8249 Family history of ischemic heart disease and other diseases of the circulatory system: Secondary | ICD-10-CM

## 2018-11-10 DIAGNOSIS — Z9842 Cataract extraction status, left eye: Secondary | ICD-10-CM

## 2018-11-10 DIAGNOSIS — Z961 Presence of intraocular lens: Secondary | ICD-10-CM | POA: Diagnosis present

## 2018-11-10 DIAGNOSIS — R9431 Abnormal electrocardiogram [ECG] [EKG]: Secondary | ICD-10-CM

## 2018-11-10 DIAGNOSIS — I361 Nonrheumatic tricuspid (valve) insufficiency: Secondary | ICD-10-CM | POA: Diagnosis not present

## 2018-11-10 DIAGNOSIS — R2689 Other abnormalities of gait and mobility: Secondary | ICD-10-CM | POA: Diagnosis not present

## 2018-11-10 DIAGNOSIS — Z800 Family history of malignant neoplasm of digestive organs: Secondary | ICD-10-CM

## 2018-11-10 DIAGNOSIS — I5043 Acute on chronic combined systolic (congestive) and diastolic (congestive) heart failure: Secondary | ICD-10-CM | POA: Diagnosis not present

## 2018-11-10 DIAGNOSIS — F419 Anxiety disorder, unspecified: Secondary | ICD-10-CM | POA: Diagnosis present

## 2018-11-10 DIAGNOSIS — R1030 Lower abdominal pain, unspecified: Secondary | ICD-10-CM | POA: Diagnosis not present

## 2018-11-10 DIAGNOSIS — Z9841 Cataract extraction status, right eye: Secondary | ICD-10-CM

## 2018-11-10 DIAGNOSIS — K2950 Unspecified chronic gastritis without bleeding: Secondary | ICD-10-CM | POA: Diagnosis present

## 2018-11-10 DIAGNOSIS — J449 Chronic obstructive pulmonary disease, unspecified: Secondary | ICD-10-CM | POA: Diagnosis present

## 2018-11-10 DIAGNOSIS — E7800 Pure hypercholesterolemia, unspecified: Secondary | ICD-10-CM

## 2018-11-10 DIAGNOSIS — Z66 Do not resuscitate: Secondary | ICD-10-CM | POA: Diagnosis not present

## 2018-11-10 DIAGNOSIS — J9690 Respiratory failure, unspecified, unspecified whether with hypoxia or hypercapnia: Secondary | ICD-10-CM | POA: Diagnosis present

## 2018-11-10 DIAGNOSIS — I10 Essential (primary) hypertension: Secondary | ICD-10-CM | POA: Diagnosis present

## 2018-11-10 DIAGNOSIS — R0902 Hypoxemia: Secondary | ICD-10-CM | POA: Diagnosis not present

## 2018-11-10 DIAGNOSIS — Z823 Family history of stroke: Secondary | ICD-10-CM

## 2018-11-10 DIAGNOSIS — Z77118 Contact with and (suspected) exposure to other environmental pollution: Secondary | ICD-10-CM

## 2018-11-10 DIAGNOSIS — R2681 Unsteadiness on feet: Secondary | ICD-10-CM | POA: Diagnosis not present

## 2018-11-10 DIAGNOSIS — Z8673 Personal history of transient ischemic attack (TIA), and cerebral infarction without residual deficits: Secondary | ICD-10-CM

## 2018-11-10 DIAGNOSIS — I255 Ischemic cardiomyopathy: Secondary | ICD-10-CM | POA: Diagnosis present

## 2018-11-10 DIAGNOSIS — I509 Heart failure, unspecified: Secondary | ICD-10-CM | POA: Diagnosis not present

## 2018-11-10 LAB — COMPREHENSIVE METABOLIC PANEL
ALT: 17 U/L (ref 0–44)
AST: 31 U/L (ref 15–41)
Albumin: 3.7 g/dL (ref 3.5–5.0)
Alkaline Phosphatase: 58 U/L (ref 38–126)
Anion gap: 17 — ABNORMAL HIGH (ref 5–15)
BUN: 23 mg/dL (ref 8–23)
CO2: 24 mmol/L (ref 22–32)
Calcium: 8.2 mg/dL — ABNORMAL LOW (ref 8.9–10.3)
Chloride: 102 mmol/L (ref 98–111)
Creatinine, Ser: 1.59 mg/dL — ABNORMAL HIGH (ref 0.44–1.00)
GFR calc Af Amer: 33 mL/min — ABNORMAL LOW (ref >60)
GFR calc non Af Amer: 29 mL/min — ABNORMAL LOW (ref >60)
Glucose, Bld: 203 mg/dL — ABNORMAL HIGH (ref 70–99)
Potassium: 3.4 mmol/L — ABNORMAL LOW (ref 3.5–5.1)
Sodium: 143 mmol/L (ref 135–145)
Total Bilirubin: 1.2 mg/dL (ref 0.3–1.2)
Total Protein: 6.6 g/dL (ref 6.5–8.1)

## 2018-11-10 LAB — CBC WITH DIFFERENTIAL/PLATELET
Abs Immature Granulocytes: 0.17 10*3/uL — ABNORMAL HIGH (ref 0.00–0.07)
Basophils Absolute: 0.1 10*3/uL (ref 0.0–0.1)
Basophils Relative: 0 %
Eosinophils Absolute: 0 10*3/uL (ref 0.0–0.5)
Eosinophils Relative: 0 %
HCT: 44.1 % (ref 36.0–46.0)
Hemoglobin: 13.7 g/dL (ref 12.0–15.0)
Immature Granulocytes: 1 %
Lymphocytes Relative: 11 %
Lymphs Abs: 1.5 10*3/uL (ref 0.7–4.0)
MCH: 32.5 pg (ref 26.0–34.0)
MCHC: 31.1 g/dL (ref 30.0–36.0)
MCV: 104.5 fL — ABNORMAL HIGH (ref 80.0–100.0)
MONO ABS: 0.5 10*3/uL (ref 0.1–1.0)
Monocytes Relative: 3 %
NEUTROS ABS: 11.2 10*3/uL — AB (ref 1.7–7.7)
Neutrophils Relative %: 85 %
Platelets: 232 10*3/uL (ref 150–400)
RBC: 4.22 MIL/uL (ref 3.87–5.11)
RDW: 15.6 % — ABNORMAL HIGH (ref 11.5–15.5)
WBC: 13.4 10*3/uL — ABNORMAL HIGH (ref 4.0–10.5)
nRBC: 0 % (ref 0.0–0.2)

## 2018-11-10 LAB — I-STAT CG4 LACTIC ACID, ED
Lactic Acid, Venous: 6.42 mmol/L (ref 0.5–1.9)
Lactic Acid, Venous: 7.25 mmol/L (ref 0.5–1.9)

## 2018-11-10 LAB — INFLUENZA PANEL BY PCR (TYPE A & B)
Influenza A By PCR: NEGATIVE
Influenza B By PCR: NEGATIVE

## 2018-11-10 LAB — I-STAT TROPONIN, ED: Troponin i, poc: 2.01 ng/mL (ref 0.00–0.08)

## 2018-11-10 LAB — APTT: aPTT: 24 seconds (ref 24–36)

## 2018-11-10 LAB — D-DIMER, QUANTITATIVE: D-Dimer, Quant: 0.63 ug/mL-FEU — ABNORMAL HIGH (ref 0.00–0.50)

## 2018-11-10 LAB — PROTIME-INR
INR: 1.09
Prothrombin Time: 14 seconds (ref 11.4–15.2)

## 2018-11-10 LAB — BRAIN NATRIURETIC PEPTIDE: B Natriuretic Peptide: 1636.4 pg/mL — ABNORMAL HIGH (ref 0.0–100.0)

## 2018-11-10 MED ORDER — VANCOMYCIN HCL 10 G IV SOLR
1500.0000 mg | Freq: Once | INTRAVENOUS | Status: AC
  Administered 2018-11-10: 1500 mg via INTRAVENOUS
  Filled 2018-11-10: qty 1500

## 2018-11-10 MED ORDER — POTASSIUM CHLORIDE 10 MEQ/100ML IV SOLN
10.0000 meq | INTRAVENOUS | Status: AC
  Administered 2018-11-11: 10 meq via INTRAVENOUS
  Filled 2018-11-10: qty 100

## 2018-11-10 MED ORDER — METRONIDAZOLE IN NACL 5-0.79 MG/ML-% IV SOLN
500.0000 mg | Freq: Three times a day (TID) | INTRAVENOUS | Status: DC
  Administered 2018-11-10 – 2018-11-11 (×2): 500 mg via INTRAVENOUS
  Filled 2018-11-10 (×3): qty 100

## 2018-11-10 MED ORDER — SODIUM CHLORIDE 0.9 % IV BOLUS (SEPSIS)
1000.0000 mL | Freq: Once | INTRAVENOUS | Status: AC
  Administered 2018-11-10: 1000 mL via INTRAVENOUS

## 2018-11-10 MED ORDER — SODIUM CHLORIDE 0.9 % IV BOLUS
1000.0000 mL | Freq: Once | INTRAVENOUS | Status: AC
  Administered 2018-11-10: 1000 mL via INTRAVENOUS

## 2018-11-10 MED ORDER — ASPIRIN 81 MG PO CHEW
324.0000 mg | CHEWABLE_TABLET | Freq: Once | ORAL | Status: AC
  Administered 2018-11-10: 324 mg via ORAL
  Filled 2018-11-10: qty 4

## 2018-11-10 MED ORDER — SODIUM CHLORIDE 0.9 % IV SOLN
2.0000 g | Freq: Once | INTRAVENOUS | Status: AC
  Administered 2018-11-10: 2 g via INTRAVENOUS
  Filled 2018-11-10: qty 2

## 2018-11-10 MED ORDER — HEPARIN (PORCINE) 25000 UT/250ML-% IV SOLN
750.0000 [IU]/h | INTRAVENOUS | Status: DC
  Administered 2018-11-10: 750 [IU]/h via INTRAVENOUS
  Filled 2018-11-10: qty 250

## 2018-11-10 NOTE — ED Notes (Signed)
I gave critical I Stat CG4 and I Stat troponin results to [employer] and [place]

## 2018-11-10 NOTE — ED Notes (Signed)
Bed: AI90 Expected date:  Expected time:  Means of arrival:  Comments: EMS 85yo [facility]

## 2018-11-10 NOTE — ED Notes (Signed)
Pt's daughter [name] can be reached at [phone] for any updates. Pt gives permission to do so.

## 2018-11-10 NOTE — ED Notes (Signed)
Pt is not any respiratory distress at this time. Pt states she "is comfortable at this moment."

## 2018-11-10 NOTE — H&P (Signed)
Linda Kelly QVZ:[id number] DOB: 08-07-1928 DOA: 11/10/2018     PCP: Binnie Rail, MD   Outpatient Specialists:   CARDS:   Dr. Andres Labrum   Pulmonary    Dr.YOung     Patient arrived to ER on 11/10/18 at 1734  Patient coming from: home  Chief Complaint:  Chief Complaint  Patient presents with  . Shortness of Breath    HPI: Linda Kelly is a 85 y.o. female with medical history significant of severe multivessel CAD not amenable to revascularization, widespread PVD, chronic systolic and diastolic heart failure (LVEF 40 to 45%), and pulmonary fibrosis on chronic 5 L O2, chronic gastritis, COPD depression, , GERD, stroke    Presented with gradual onset shortness of breath she has attempted to use her rescue inhaler multiple times but did not seem to help EMS was called was given 10 mg of albuterol Atrovent 0.5 mg and Solu-Medrol 125 mg Patient also endorsing some vague abdominal discomfort is been going on for past few weeks.  No cough no fevers or chills no nausea vomiting or diarrhea no chest pain today. Since admitted in December 2019 with severe sepsis secondary to urinary tract infection that time had elevated troponin cardiology was consulted and recommended continued medical therapy. Regarding pertinent Chronic problems  CAD on Zetia and aspirin, Imdur, toprolol, Zocor  CHF on Lasix 40 mg twice a day EF 40-45% by echo Oct 2017 Pulmonary fibrosis on DuoNeb and chronic oxygen While in ER: Noted to be tachypneic for respirations up to 28  Lactic acid noted to be elevated to 7.25 Patient is hypotensive with blood pressures as low 70s over 50s Code sepsis was initiated Troponin noted to be elevated to 2.01 Cardiology was consulted initially recommended transfer to ICU at [facility] has accepted But then further discussion with patient and family it was determined that she wishes to be DO NOT RESUSCITATE DO NOT INTUBATE and does not want aggressive intervention such  as cardiac catheterizations at this time At this point PCCM felt that there was nothing that they can offer and for medicine to admit.  The following Work up has been ordered so far:  Orders Placed This Encounter  Procedures  . Critical Care  . Culture, blood (routine x 2)  . DG Chest 2 View  . CT ABDOMEN PELVIS WO CONTRAST  . Brain natriuretic peptide  . Comprehensive metabolic panel  . Urinalysis, Routine w reflex microscopic  . CBC with Differential  . D-dimer, quantitative  . Influenza panel by PCR (type A & B)  . Protime-INR  . APTT  . CBC  . Cardiac monitoring  . Refer to Sidebar Report for: Sepsis Bundle ED/IP  . Document vital signs within 1-hour of fluid bolus completion and notify provider of bolus completion  . Document Actual / Estimated Weight  . Call Code Sepsis ([facility] [phone]) Reason for Consult? tracking  . Inpatient consult to Cardiology  . heparin per pharmacy consult  . Consult to intensivist  . Consult to hospitalist  . Pulse oximetry (single)  . Pulse oximetry, continuous  . I-stat troponin, ED  . I-Stat CG4 Lactic Acid, ED  . ED EKG  . EKG 12-Lead  . Insert peripheral IV      Following Medications were ordered in ER: Medications  metroNIDAZOLE (FLAGYL) IVPB 500 mg (has no administration in time range)  vancomycin (VANCOCIN) 1,500 mg in sodium chloride 0.9 % 500 mL IVPB ( Intravenous Rate/Dose Verify 11/10/18  2127)  heparin ADULT infusion 100 units/mL (25000 units/232mL sodium chloride 0.45%) (750 Units/hr Intravenous New Bag/Given 11/10/18 2213)  sodium chloride 0.9 % bolus 1,000 mL (0 mLs Intravenous Stopped 11/10/18 2034)  sodium chloride 0.9 % bolus 1,000 mL (1,000 mLs Intravenous New Bag/Given 11/10/18 2044)  ceFEPIme (MAXIPIME) 2 g in sodium chloride 0.9 % 100 mL IVPB (0 g Intravenous Stopped 11/10/18 2125)  aspirin chewable tablet 324 mg (324 mg Oral Given 11/10/18 2102)    Significant initial  Findings: Abnormal Labs Reviewed  BRAIN  NATRIURETIC PEPTIDE - Abnormal; Notable for the following components:      Result Value   B Natriuretic Peptide 1,636.4 (*)    All other components within normal limits  COMPREHENSIVE METABOLIC PANEL - Abnormal; Notable for the following components:   Potassium 3.4 (*)    Glucose, Bld 203 (*)    Creatinine, Ser 1.59 (*)    Calcium 8.2 (*)    GFR calc non Af Amer 29 (*)    GFR calc Af Amer 33 (*)    Anion gap 17 (*)    All other components within normal limits  CBC WITH DIFFERENTIAL/PLATELET - Abnormal; Notable for the following components:   WBC 13.4 (*)    MCV 104.5 (*)    RDW 15.6 (*)    Neutro Abs 11.2 (*)    Abs Immature Granulocytes 0.17 (*)    All other components within normal limits  D-DIMER, QUANTITATIVE (NOT AT [facility]) - Abnormal; Notable for the following components:   D-Dimer, Quant 0.63 (*)    All other components within normal limits  I-STAT TROPONIN, ED - Abnormal; Notable for the following components:   Troponin i, poc 2.01 (*)    All other components within normal limits  I-STAT CG4 LACTIC ACID, ED - Abnormal; Notable for the following components:   Lactic Acid, Venous 6.42 (*)    All other components within normal limits  I-STAT CG4 LACTIC ACID, ED - Abnormal; Notable for the following components:   Lactic Acid, Venous 7.25 (*)    All other components within normal limits     Lactic Acid, Venous    Component Value Date/Time   LATICACIDVEN 7.25 (HH) 11/10/2018 2121    Na 143 K 3.4  Cr Up from baseline see below Lab Results  Component Value Date   CREATININE 1.59 (H) 11/10/2018   CREATININE 1.01 10/15/2018   CREATININE 0.84 1Nov 29, 202019      WBC  13.4   HG/HCT stable,     Component Value Date/Time   HGB 13.7 11/10/2018 1816   HGB 11.5 07/31/2018 1531   HCT 44.1 11/10/2018 1816   HCT 34.7 07/31/2018 1531   Troponin (Point of Care Test) Recent Labs    11/10/18 1927  TROPIPOC 2.01*    BNP (last 3 results) Recent Labs    11/10/18 1815    BNP 1,636.4*    ProBNP (last 3 results) Recent Labs    07/23/18 1111  PROBNP 162.0*    UA  ordered   CXR - chronic interstitial fibrosis  CTabd/pelvis -  nonacute  ECG:  Personally reviewed by me showing: HR : 91 Rhythm: SR   nonspecific changes T wave changes in anterior leads QTC 546   Influenza neg    ED Triage Vitals  Enc Vitals Group     BP 11/10/18 1753 (!) 114/98     Pulse Rate 11/10/18 1753 93     Resp 11/10/18 1754 (!) 28  Temp 11/10/18 1754 97.7 F (36.5 C)     Temp src --      SpO2 11/10/18 1753 98 %     Weight 11/10/18 1756 141 lb (64 kg)     Height 11/10/18 1756 4\' 10"  (1.473 m)     Head Circumference --      Peak Flow --      Pain Score 11/10/18 1756 0     Pain Loc --      Pain Edu? --      Excl. in [place]? --   TMAX(24)@       Latest  Blood pressure (!) 99/54, pulse 80, temperature 97.7 F (36.5 C), resp. rate (!) 24, height 4\' 10"  (1.473 m), weight 64 kg, SpO2 99 %.    ER Provider Called:  CARDIOLOGY    They Recommend starting IV heparin Will see if transferred to [facility]  Hospitalist was called for admission for severe sepsis with septic shock and NSTEMI   Review of Systems:    Pertinent positives include: fatigue shortness of breath at rest  dyspnea on exertion Constitutional:  No weight loss, night sweats, Fevers, chills, , weight loss  HEENT:  No headaches, Difficulty swallowing,Tooth/dental problems,Sore throat,  No sneezing, itching, ear ache, nasal congestion, post nasal drip,  Cardio-vascular:  No chest pain, Orthopnea, PND, anasarca, dizziness, palpitations.no Bilateral lower extremity swelling  GI:  No heartburn, indigestion, abdominal pain, nausea, vomiting, diarrhea, change in bowel habits, loss of appetite, melena, blood in stool, hematemesis Resp:   No excess mucus, no productive cough, No non-productive cough, No coughing up of blood.No change in color of mucus.No wheezing. Skin:  no rash or lesions. No jaundice GU:   no dysuria, change in color of urine, no urgency or frequency. No straining to urinate.  No flank pain.  Musculoskeletal:  No joint pain or no joint swelling. No decreased range of motion. No back pain.  Psych:  No change in mood or affect. No depression or anxiety. No memory loss.  Neuro: no localizing neurological complaints, no tingling, no weakness, no double vision, no gait abnormality, no slurred speech, no confusion  All systems reviewed and apart from [place] all are negative  Past Medical History:   Past Medical History:  Diagnosis Date  . Anxiety   . Anxiety and depression   . Arthritis   . Benign neoplasm of esophagus   . CAD (coronary artery disease)   . Cancer [facility])    Skin cancer on back  . Carotid artery disease ([place])   . CHF (congestive heart failure) ([place])   . Chronic gastritis   . Colon, diverticulosis   . COPD (chronic obstructive pulmonary disease) ([place])   . Depression   . GERD (gastroesophageal reflux disease)   . Hiatal hernia   . High frequency hearing loss   . Hyperlipidemia   . IBS (irritable bowel syndrome)   . Idiopathic pulmonary fibrosis   . Internal hemorrhoid   . MI (myocardial infarction) ([place]) 1985  . Other nonthrombocytopenic purpuras   . PONV (postoperative nausea and vomiting)   . PVD (peripheral vascular disease) ([place])   . Shortness of breath    with exertion  . Solitary pulmonary nodule   . Stroke [facility])    Hx: of several mini -strokes  . Tinnitus    chronic  . Vertigo     Past Surgical History:  Procedure Laterality Date  . ANGIOPLASTY  1997, 98, 99  . BREAST SURGERY  Hx; of biopsy  . CARDIAC CATHETERIZATION N/A 08/10/2016   Procedure: Left Heart Cath and Coronary Angiography;  Surgeon: Belva Crome, MD;  Location: [facility];  Service: Cardiovascular;  Laterality: N/A;  . CAROTID ENDARTERECTOMY     left side x 3, saphenous vein graft from left leg  . CATARACT EXTRACTION W/ INTRAOCULAR LENS  IMPLANT, BILATERAL     . COLON SURGERY    . COLONOSCOPY  11-27-01, 10-30-05, 05-24-11   diverticulosis, hemorrhoids  . DENTAL SURGERY     2012   . ENDARTERECTOMY Right 03/03/2014   Procedure: RIGHT CAROTID ENDARTERECTOMY WITH PATCH ANGIOPLASTY;  Surgeon: Rosetta Posner, MD;  Location: [facility];  Service: Vascular;  Laterality: Right;  . FOOT SURGERY     bilateral  . SHOULDER SURGERY     left  . TOE SURGERY     right foot  . TONSILLECTOMY    . TUBAL LIGATION    . UPPER GASTROINTESTINAL ENDOSCOPY  11-27-01, 08-18-08   Hiatal hernia, benign esophagus neoplasia, gastritis, tortuous esophagus 30 Maloney dilation performed    Social History:        reports that she quit smoking about 4 years ago. Her smoking use included cigarettes. She has a 30.00 pack-year smoking history. She has never used smokeless tobacco. She reports that she does not drink alcohol or use drugs.     Family History:   Family History  Problem Relation Age of Onset  . Heart attack Father        deceased at 65, MGF  . Colon cancer Father   . Prostate cancer Father   . Heart disease Father   . Hyperlipidemia Father   . Heart disease Mother        deaceased at age 44  . Colitis Mother   . Stroke Mother   . Hyperlipidemia Mother   . Colitis Sister   . Hyperlipidemia Sister   . Other Brother        deceased age 72; war  . Hyperlipidemia Brother   . Breast cancer Maternal Aunt        great   . Lung cancer Paternal Aunt        great  . Vasculitis Son   . Hyperlipidemia Son   . Hypertension Son   . Heart attack Son   . Cancer Sister        unknown  . Hyperlipidemia Sister   . Hyperlipidemia Daughter   . Hypertension Daughter   . COPD Neg Hx   . Asthma Neg Hx     Allergies: No Known Allergies   Prior to Admission medications   Medication Sig Start Date End Date Taking? Authorizing Provider  ALPRAZolam (XANAX) 0.25 MG tablet Take 1 tablet (0.25 mg total) by mouth 3 (three) times daily as needed for anxiety. 10/15/18  Yes  Burns, Claudina Lick, MD  acetaminophen (TYLENOL) 650 MG CR tablet Take 650 mg by mouth daily.    [provider]  aspirin EC 81 MG tablet Take 81 mg by mouth at bedtime.     [provider]  cholecalciferol (VITAMIN D) 1000 UNITS tablet Take 1,000 Units by mouth at bedtime.     [provider]  ezetimibe (ZETIA) 10 MG tablet Take 1 tablet (10 mg total) by mouth daily. 08/07/18 11/06/18  Lelon Perla, MD  famotidine (PEPCID) 20 MG tablet Take 1 tablet (20 mg total) by mouth 2 (two) times daily. 09/18/18   Binnie Rail,  MD  furosemide (LASIX) 40 MG tablet TAKE 1 TABLET BY MOUTH TWICE A DAY 10/07/18   Lelon Perla, MD  ipratropium-albuterol (DUONEB) 0.5-2.5 (3) MG/3ML SOLN Take 3 mLs by nebulization every 4 (four) hours as needed. Patient taking differently: Take 3 mLs by nebulization every 4 (four) hours as needed (shortness of breath).  09/24/18   Baird Lyons D, MD  isosorbide mononitrate (IMDUR) 30 MG 24 hr tablet Take 1 tablet (30 mg total) by mouth daily. 04/25/18 11/06/18  Lelon Perla, MD  metoprolol succinate (TOPROL-XL) 25 MG 24 hr tablet Take 0.5 tablets (12.5 mg total) by mouth daily. 10/13/18   Lelon Perla, MD  montelukast (SINGULAIR) 10 MG tablet TAKE 1 TABLET BY MOUTH EVERYDAY AT BEDTIME Patient taking differently: Take 10 mg by mouth at bedtime.  09/29/18   Binnie Rail, MD  nitroGLYCERIN (NITROSTAT) 0.4 MG SL tablet DISSOLVE 1 TABLET UNDER THE TONGUE EVERY 5 MINUTES FOR 3 DOSES AS NEEDED FOR CHEST PAIN. Patient taking differently: Place 0.4 mg under the tongue every 5 (five) minutes as needed for chest pain.  09/22/18   Binnie Rail, MD  Omega-3 Fatty Acids (FISH OIL) 1000 MG CAPS Take 1,000 mg by mouth at bedtime.     [provider]  potassium chloride (K-DUR) 10 MEQ tablet TAKE 1/2 TABLET ONCE DAILY Patient taking differently: Take 5 mEq by mouth daily.  09/22/18   Lelon Perla, MD  predniSONE (DELTASONE) 5 MG tablet Take 1  tablet (5 mg total) by mouth daily with breakfast. After prednisone taper completed, start 5mg  daily x 3 weeks 10/16/18   Martyn Ehrich, NP  sertraline (ZOLOFT) 50 MG tablet TAKE 1 TABLET BY MOUTH EVERY DAY Patient taking differently: Take 50 mg by mouth daily.  08/06/18   Binnie Rail, MD  simvastatin (ZOCOR) 40 MG tablet TAKE 1 TABLET BY MOUTH EVERY DAY Patient taking differently: Take 40 mg by mouth daily at 6 PM.  07/08/18   Burns, Claudina Lick, MD  vitamin E 400 UNIT capsule Take 400 Units by mouth at bedtime.     [provider]   Physical Exam: Blood pressure (!) 99/54, pulse 80, temperature 97.7 F (36.5 C), resp. rate (!) 24, height 4\' 10"  (1.473 m), weight 64 kg, SpO2 99 %. 1. General:  in No Acute distress   Chronically ill  -appearing 2. Psychological: Alert and  Oriented 3. Head/ENT:   Dry Mucous Membranes                          Head Non traumatic, neck supple                             Poor Dentition 4. SKIN:  decreased Skin turgor,  Skin clean Dry and intact no rash 5. Heart: Regular rate and rhythm no  Murmur, no Rub or gallop 6. Lungs:   Some  Wheezes and crackles   7. Abdomen: Soft non-tender, Non distended   obese  bowel sounds present 8. Lower extremities: no clubbing, cyanosis, or  edema 9. Neurologically Grossly intact, moving all 4 extremities equally  10. MSK: Normal range of motion   LABS:     Recent Labs  Lab 11/10/18 1816  WBC 13.4*  NEUTROABS 11.2*  HGB 13.7  HCT 44.1  MCV 104.5*  PLT 267   Basic Metabolic Panel: Recent Labs  Lab 11/10/18  1816  NA 143  K 3.4*  CL 102  CO2 24  GLUCOSE 203*  BUN 23  CREATININE 1.59*  CALCIUM 8.2*      Recent Labs  Lab 11/10/18 1816  AST 31  ALT 17  ALKPHOS 58  BILITOT 1.2  PROT 6.6  ALBUMIN 3.7   No results for input(s): LIPASE, AMYLASE in the last 168 hours. No results for input(s): AMMONIA in the last 168 hours.    HbA1C: No results for input(s): HGBA1C in the last 72  hours. CBG: No results for input(s): GLUCAP in the last 168 hours.    Urine analysis:    Component Value Date/Time   COLORURINE AMBER (A) 10/02/2018 1959   APPEARANCEUR HAZY (A) 10/02/2018 1959   LABSPEC 1.014 10/02/2018 1959   PHURINE 5.0 10/02/2018 1959   GLUCOSEU NEGATIVE 10/02/2018 1959   HGBUR SMALL (A) 10/02/2018 [place] NEGATIVE 10/02/2018 1959   BILIRUBINUR neg 09/06/2011 [place] 10/02/2018 1959   PROTEINUR NEGATIVE 10/02/2018 1959   UROBILINOGEN 0.2 02/26/2014 1306   NITRITE NEGATIVE 10/02/2018 1959   LEUKOCYTESUR LARGE (A) 10/02/2018 1959       Cultures:    Component Value Date/Time   SDES URINE, RANDOM 10/02/2018 1951   SPECREQUEST  10/02/2018 1951    NONE Performed at [facility], [place] [address], [place], [place] [address]    CULT >=100,000 COLONIES/mL ESCHERICHIA COLI (A) 10/02/2018 1951   REPTSTATUS 10/05/2018 FINAL 10/02/2018 1951     Radiological Exams on Admission: Dg Chest 2 View  Result Date: 11/10/2018 CLINICAL DATA:  Shortness of breath over 1 day. History of COPD. Home oxygen. EXAM: CHEST - 2 VIEW COMPARISON:  10/08/2018 FINDINGS: Mild cardiac enlargement. Low lung volumes. No vascular congestion or edema. Diffuse interstitial infiltrates in the lungs unchanged since previous studies consistent with chronic interstitial fibrosis. No blunting of costophrenic angles. No pneumothorax. Mediastinal contours appear intact. Calcification of the aorta. Esophageal hiatal hernia behind the heart. Degenerative changes in the spine and shoulders. IMPRESSION: Chronic interstitial fibrosis in the lungs. No evidence of active pulmonary disease. Electronically Signed   By: Lucienne Capers M.D.   On: 11/10/2018 19:10    Chart has been reviewed    Assessment/Plan  85 y.o. female with medical history significant of severe multivessel CAD not amenable to revascularization, widespread PVD, chronic systolic and diastolic heart  failure (LVEF 40 to 45%), and pulmonary fibrosis on chronic 5 L O2, chronic gastritis, COPD depression, , GERD, stroke   Admitted for NSTEMI and Sepsis  Present on Admission: . Severe sepsis ([place]) - Admit per Sepsis protocol  Source unknown - rehydrate with 68ml/kg  - initiate broad spectrum antibiotics  Vancomycin and cefepime, matronidazole  -  obtain blood cultures  - Obtain serial lactic acid  - Obtain procalcitonin level  - Admit and monitor vital signs closely  - PCCM was made aware initially   Sepsis - Repeat Assessment  Performed at:    23:30  Vitals     Blood pressure 96/73, pulse 85, temperature 97.7 F (36.5 C), resp. rate (!) 26, height 4\' 10"  (1.473 m), weight 64 kg, SpO2 97 %.  Heart:     Regular rate and rhythm  Lungs:    Rhonchi  Capillary Refill:   <2 sec  Peripheral Pulse:   Radial pulse palpable  Skin:     Normal Color   . Hyperlipidemia -we will continue home medications . Essential hypertension - hold home  occasions given hypotension . COPD mixed type ([place]) possible exacerbation -  -  - Will initiate: Steroid taper  -  Antibiotics   -  XopenexPRN, - scheduled duoneb,  -  Breo or Dulera at discharge   -  Mucinex.  Titrate O2 to saturation >90%. Follow patients respiratory status.  Order respiratory panel   influenza PCR negative    Currently mentating well no evidence of symptomatic hypercarbia   . GERD -chronic restart home medications when able to tolerate . PVD (peripheral vascular disease) ([place]) -early off of Plavix given severe epistaxis. . NSTEMI myocardial infarction ([place]) -versus demand ischemia.  Continue heparin patient does not wish to undergo cardiac catheterization would like to avoid aggressive interventions.  This was discussed with family at length.  Keep patient at [facility] for now  . Chronic combined systolic (congestive) and diastolic (congestive) heart failure (HCC) currently appears to be on the dry side and hypotensive.   Gentle IV fluids . Pulmonary fibrosis (HCC) -chronic stable at baseline of 5 L of oxygen . Chronic respiratory failure with hypoxia (HCC) -secondary to pulmonary fibrosis continue oxygen . Demand ischemia ([place])-  trend troponin as per cardiology patient is currently on heparin appreciate the input suddenly had very similar admission.  Order EKG   serial and echo    . Prolonged QT interval - - will monitor on tele avoid QT prolonging medications, rehydrate correct electrolytes   AKI will rehydrate and follow fluid status Other plan as per orders.  DVT prophylaxis:  heparin    Code Status:   DNR/DNI  as per patient  Initially discussed with family who wished to continue pressors only as needed.  Discussed with patient who at this point is alert and oriented and stated that she does not wish to have pressors at this time.  She wishes to be completely DO NOT RESUSCITATE DO NOT INTUBATE Will order palliative care consult for discussion of goals of care and completion of most form I had personally discussed CODE STATUS with patient and family  I had spent 25 min discussing goals of care and CODE STATUS  Family Communication:   Family not  at  Bedside  plan of care was discussed with   Daughter on the phone Disposition Plan:     To home once workup is complete and patient is stable                     Would benefit from PT/OT eval prior to DC  Ordered     Consults called: cardiology aware  Admission status:   inpatient     Expect 2 midnight stay secondary to severity of patient's current illness including   hemodynamic instability despite optimal treatment (tachycardia  hypotension  hypoxia, tachypnea)  Severe lab/radiological abnormalities including:   Elevated troponin lactic acid and extensive comorbidities including:   CHF   CAD  COPD  .      That are currently affecting medical management.   I expect  patient to be hospitalized for 2 midnights requiring inpatient medical  care.  Patient is at high risk for adverse outcome (such as loss of life or disability) if not treated.  Indication for inpatient stay as follows:  Hemodynamic instability despite maximal medical therapy,          New or worsening hypoxia  Need for IV antibiotics, IV fluids,        Level of care  SDU tele indefinitely please discontinue once patient no longer qualifies      Saryiah Bencosme 11/11/2018, 12:36 AM    [facility]  Pager [phone]   after 2 AM please page floor coverage PA If 7AM-7PM, please contact the day team taking care of the patient  [email or web address]  Password [email or web address]

## 2018-11-10 NOTE — Progress Notes (Signed)
ANTICOAGULATION CONSULT NOTE - Initial Consult  Pharmacy Consult for Heparin Indication: NSTEMI  No Known Allergies  Patient Measurements: Height: 4\' 10"  (147.3 cm) Weight: 141 lb (64 kg) IBW/kg (Calculated) : 40.9 HEPARIN DW (KG): 55  Vital Signs: Temp: 97.7 F (36.5 C) (01/13 1754) BP: 98/78 (01/13 2008) Pulse Rate: 88 (01/13 2008)  Labs: Recent Labs    11/10/18 1816  HGB 13.7  HCT 44.1  PLT 232  CREATININE 1.59*    Estimated Creatinine Clearance: 19.3 mL/min (A) (by C-G formula based on SCr of 1.59 mg/dL (H)).   Medical History: Past Medical History:  Diagnosis Date  . Anxiety   . Anxiety and depression   . Arthritis   . Benign neoplasm of esophagus   . CAD (coronary artery disease)   . Cancer [facility])    Skin cancer on back  . Carotid artery disease ([place])   . CHF (congestive heart failure) ([place])   . Chronic gastritis   . Colon, diverticulosis   . COPD (chronic obstructive pulmonary disease) ([place])   . Depression   . GERD (gastroesophageal reflux disease)   . Hiatal hernia   . High frequency hearing loss   . Hyperlipidemia   . IBS (irritable bowel syndrome)   . Idiopathic pulmonary fibrosis   . Internal hemorrhoid   . MI (myocardial infarction) ([place]) 1985  . Other nonthrombocytopenic purpuras   . PONV (postoperative nausea and vomiting)   . PVD (peripheral vascular disease) ([place])   . Shortness of breath    with exertion  . Solitary pulmonary nodule   . Stroke [facility])    Hx: of several mini -strokes  . Tinnitus    chronic  . Vertigo     Medications:  No blood thinners PTA Infusions:  . ceFEPime (MAXIPIME) IV 2 g (11/10/18 2045)  . metronidazole    . sodium chloride 1,000 mL (11/10/18 2044)  . vancomycin       Assessment: 85 yo F with hx CAD presents with NSTEMI with plans for medical management.   CBC WNL LFTS WNL- baseline coags pending No bleeding noted.   Goal of Therapy:  Heparin level 0.3-0.7 units/ml Monitor platelets by  anticoagulation protocol: Yes   Plan:  No bolus per cardiology Start heparin infusion at 750 units/hr Check anti-Xa level in 8 hours and daily while on heparin Continue to monitor H&H and platelets  Biagio Borg 11/10/2018,8:41 PM

## 2018-11-10 NOTE — Progress Notes (Signed)
A consult was received from an ED physician for Vancomycin & Cefepime per pharmacy dosing.  The patient's profile has been reviewed for ht/wt/allergies/indication/available labs.   A one time order has been placed for Cefepime 2gm & Vancomycin 1500mg  IV.  Further antibiotics/pharmacy consults should be ordered by admitting physician if indicated.                       Thank you, Biagio Borg 11/10/2018  7:42 PM

## 2018-11-10 NOTE — ED Notes (Signed)
I gave critical I Stat CG4 to MD Wilson Singer and [place]

## 2018-11-10 NOTE — ED Triage Notes (Signed)
Patient is from home and transported via [facility]. Patient is complaining of shortness of breath that started gradually over the day. Patient has a history of COPD and usually on oxygen 5L via [facility]. Patient reports she has used her rescue inhaler multiple times today. Patient has received ALBUTEROL 10mg , ALTROVENT 0.5mg , and SOLU-MEDROL 125mg .

## 2018-11-10 NOTE — ED Provider Notes (Signed)
[facility] DEPT Provider Note   CSN: [id number] Arrival date & time: 11/10/18  1734     History   Chief Complaint Chief Complaint  Patient presents with  . Shortness of Breath    HPI Linda Kelly is a 85 y.o. female.  HPI   Linda Kelly is a 85 y.o. female, with a history of ischemic cardiomyopathy, CAD, COPD, CHF, hyperlipidemia, MI, urosepsis, presenting to the ED with shortness of breath beginning last night.  She is on baseline supplemental O2 of 5 lpm due to COPD. Intermittent chest pain for last few weeks. Her last episode was last night, central chest, vague description, nonradiating. She has taken NTG with her previous episodes of CP recently, but didn't take any last night.   She has had intermittently lower abdominal pain for the past several weeks. She also gives this a vague description.  Denies cough, fever/chills, N/V/D, current CP, congestion, urinary symptoms, lower extremity pain/swelling, or any other complaints.    Past Medical History:  Diagnosis Date  . Anxiety   . Anxiety and depression   . Arthritis   . Benign neoplasm of esophagus   . CAD (coronary artery disease)   . Cancer [facility])    Skin cancer on back  . Carotid artery disease ([place])   . CHF (congestive heart failure) ([place])   . Chronic gastritis   . Colon, diverticulosis   . COPD (chronic obstructive pulmonary disease) ([place])   . Depression   . GERD (gastroesophageal reflux disease)   . Hiatal hernia   . High frequency hearing loss   . Hyperlipidemia   . IBS (irritable bowel syndrome)   . Idiopathic pulmonary fibrosis   . Internal hemorrhoid   . MI (myocardial infarction) ([place]) 1985  . Other nonthrombocytopenic purpuras   . PONV (postoperative nausea and vomiting)   . PVD (peripheral vascular disease) ([place])   . Shortness of breath    with exertion  . Solitary pulmonary nodule   . Stroke [facility])    Hx: of several mini -strokes  . Tinnitus    chronic  .  Vertigo     Patient Active Problem List   Diagnosis Date Noted  . Sepsis ([place]) 11/10/2018  . Mild epistaxis 10/17/2018  . Acute renal failure (ARF) ([place]) 10/03/2018  . Acute combined systolic and diastolic congestive heart failure ([place]) 10/03/2018  . Acute lower UTI 10/03/2018  . Nausea and vomiting 10/03/2018  . Elevated liver enzymes 10/03/2018  . Prolonged QT interval 10/03/2018  . Severe sepsis ([place]) 10/02/2018  . Excessive cerumen in ear canal, bilateral 09/23/2018  . Left-sided headache 09/11/2017  . Ischemic cardiomyopathy 03/14/2017  . Pain of left upper extremity 02/18/2017  . Steroid-induced hyperglycemia 01/08/2017  . Anxiety state 01/08/2017  . Increased oxygen demand   . Palliative care by specialist   . Encounter for hospice care discussion   . Palliative care encounter   . Goals of care, counseling/discussion   . Demand ischemia ([place])   . Elevated troponin   . Chronic respiratory failure with hypoxia ([place]) 11/29/2016  . Episodic lightheadedness 08/28/2016  . Sleep difficulties 08/28/2016  . Chronic combined systolic (congestive) and diastolic (congestive) heart failure ([place])   . Pulmonary fibrosis ([place])   . NSTEMI myocardial infarction ([place]) 08/08/2016  . Lumbar radiculopathy 08/01/2016  . Left-sided low back pain without sciatica 03/28/2016  . Cephalalgia 03/10/2016  . Overactive bladder 02/21/2016  . Poor balance 02/21/2016  . Chronic lower back pain  02/21/2016  . Carotid stenosis-s/p CEA 2015 03/03/2014  . PVD (peripheral vascular disease) ([place]) 02/04/2014  . Change in bowel habits 05/22/2011  . URINARY INCONTINENCE 12/22/2010  . ABDOMINAL BRUIT 08/10/2010  . FASTING HYPERGLYCEMIA 11/15/2009  . Depression 08/01/2009  . GERD 08/01/2009  . IRRITABLE BOWEL SYNDROME 12/27/2008  . TINNITUS, CHRONIC 11/05/2008  . HEARING LOSS, HIGH FREQUENCY 11/05/2008  . HIATAL HERNIA 08/11/2008  . Hypotension 06/08/2008  . Lung nodule 11/20/2007  . OBESITY, MILD  11/19/2007  . Hyperlipidemia 10/09/2007  . Tobacco abuse, in remission 10/09/2007  . Essential hypertension 10/09/2007  . CAD S/P percutaneous coronary angioplasty 1985 10/09/2007  . COPD mixed type ([place]) 10/09/2007  . Dizziness 03/31/2007  . Headache 03/31/2007    Past Surgical History:  Procedure Laterality Date  . ANGIOPLASTY  1997, 98, 99  . BREAST SURGERY     Hx; of biopsy  . CARDIAC CATHETERIZATION N/A 08/10/2016   Procedure: Left Heart Cath and Coronary Angiography;  Surgeon: Belva Crome, MD;  Location: [facility];  Service: Cardiovascular;  Laterality: N/A;  . CAROTID ENDARTERECTOMY     left side x 3, saphenous vein graft from left leg  . CATARACT EXTRACTION W/ INTRAOCULAR LENS  IMPLANT, BILATERAL    . COLON SURGERY    . COLONOSCOPY  11-27-01, 10-30-05, 05-24-11   diverticulosis, hemorrhoids  . DENTAL SURGERY     2012   . ENDARTERECTOMY Right 03/03/2014   Procedure: RIGHT CAROTID ENDARTERECTOMY WITH PATCH ANGIOPLASTY;  Surgeon: Rosetta Posner, MD;  Location: [facility];  Service: Vascular;  Laterality: Right;  . FOOT SURGERY     bilateral  . SHOULDER SURGERY     left  . TOE SURGERY     right foot  . TONSILLECTOMY    . TUBAL LIGATION    . UPPER GASTROINTESTINAL ENDOSCOPY  11-27-01, 08-18-08   Hiatal hernia, benign esophagus neoplasia, gastritis, tortuous esophagus 74 Maloney dilation performed     OB History   No obstetric history on file.      Home Medications    Prior to Admission medications   Medication Sig Start Date End Date Taking? Authorizing Provider  acetaminophen (TYLENOL) 650 MG CR tablet Take 650 mg by mouth daily.   Yes [provider]  ALPRAZolam (XANAX) 0.25 MG tablet Take 1 tablet (0.25 mg total) by mouth 3 (three) times daily as needed for anxiety. 10/15/18  Yes Binnie Rail, MD  aspirin EC 81 MG tablet Take 81 mg by mouth at bedtime.    Yes [provider]  cholecalciferol (VITAMIN D) 1000 UNITS tablet Take 1,000 Units by  mouth at bedtime.    Yes [provider]  famotidine (PEPCID) 20 MG tablet Take 1 tablet (20 mg total) by mouth 2 (two) times daily. 09/18/18  Yes Burns, Claudina Lick, MD  furosemide (LASIX) 40 MG tablet TAKE 1 TABLET BY MOUTH TWICE A DAY Patient taking differently: Take 40 mg by mouth 2 (two) times daily.  10/07/18  Yes Lelon Perla, MD  ipratropium-albuterol (DUONEB) 0.5-2.5 (3) MG/3ML SOLN Take 3 mLs by nebulization every 4 (four) hours as needed. Patient taking differently: Take 3 mLs by nebulization every 4 (four) hours as needed (shortness of breath).  09/24/18  Yes Young, Tarri Fuller D, MD  metoprolol succinate (TOPROL-XL) 25 MG 24 hr tablet Take 0.5 tablets (12.5 mg total) by mouth daily. 10/13/18  Yes Lelon Perla, MD  montelukast (SINGULAIR) 10 MG tablet TAKE 1 TABLET BY MOUTH EVERYDAY  AT BEDTIME Patient taking differently: Take 10 mg by mouth at bedtime.  09/29/18  Yes Burns, Claudina Lick, MD  nitroGLYCERIN (NITROSTAT) 0.4 MG SL tablet DISSOLVE 1 TABLET UNDER THE TONGUE EVERY 5 MINUTES FOR 3 DOSES AS NEEDED FOR CHEST PAIN. Patient taking differently: Place 0.4 mg under the tongue every 5 (five) minutes as needed for chest pain.  09/22/18  Yes Burns, Claudina Lick, MD  Omega-3 Fatty Acids (FISH OIL) 1000 MG CAPS Take 1,000 mg by mouth at bedtime.    Yes [provider]  potassium chloride (K-DUR) 10 MEQ tablet TAKE 1/2 TABLET ONCE DAILY Patient taking differently: Take 5 mEq by mouth daily.  09/22/18  Yes Lelon Perla, MD  predniSONE (DELTASONE) 5 MG tablet Take 1 tablet (5 mg total) by mouth daily with breakfast. After prednisone taper completed, start 5mg  daily x 3 weeks 10/16/18  Yes Martyn Ehrich, NP  sertraline (ZOLOFT) 50 MG tablet TAKE 1 TABLET BY MOUTH EVERY DAY Patient taking differently: Take 50 mg by mouth daily.  08/06/18  Yes Burns, Claudina Lick, MD  simvastatin (ZOCOR) 40 MG tablet TAKE 1 TABLET BY MOUTH EVERY DAY Patient taking differently: Take 40 mg by mouth  daily at 6 PM.  07/08/18  Yes Burns, Claudina Lick, MD  vitamin E 400 UNIT capsule Take 400 Units by mouth at bedtime.    Yes [provider]  ezetimibe (ZETIA) 10 MG tablet Take 1 tablet (10 mg total) by mouth daily. 08/07/18 11/06/18  Lelon Perla, MD  isosorbide mononitrate (IMDUR) 30 MG 24 hr tablet Take 1 tablet (30 mg total) by mouth daily. 04/25/18 11/06/18  Lelon Perla, MD    Family History Family History  Problem Relation Age of Onset  . Heart attack Father        deceased at 57, MGF  . Colon cancer Father   . Prostate cancer Father   . Heart disease Father   . Hyperlipidemia Father   . Heart disease Mother        deaceased at age 15  . Colitis Mother   . Stroke Mother   . Hyperlipidemia Mother   . Colitis Sister   . Hyperlipidemia Sister   . Other Brother        deceased age 20; war  . Hyperlipidemia Brother   . Breast cancer Maternal Aunt        great   . Lung cancer Paternal Aunt        great  . Vasculitis Son   . Hyperlipidemia Son   . Hypertension Son   . Heart attack Son   . Cancer Sister        unknown  . Hyperlipidemia Sister   . Hyperlipidemia Daughter   . Hypertension Daughter   . COPD Neg Hx   . Asthma Neg Hx     Social History Social History   Tobacco Use  . Smoking status: Former Smoker    Packs/day: 0.50    Years: 60.00    Pack years: 30.00    Types: Cigarettes    Last attempt to quit: 02/26/2014    Years since quitting: 4.7  . Smokeless tobacco: Never Used  Substance Use Topics  . Alcohol use: No  . Drug use: No     Allergies   Patient has no known allergies.   Review of Systems Review of Systems  Constitutional: Negative for chills, diaphoresis and fever.  Respiratory: Positive for shortness of breath. Negative for cough.  Cardiovascular: Positive for chest pain (intermittent). Negative for leg swelling.  Gastrointestinal: Negative for blood in stool, diarrhea, nausea and vomiting.  Genitourinary: Negative for  dysuria, frequency and hematuria.  Neurological: Positive for weakness (generalized) and light-headedness.  All other systems reviewed and are negative.    Physical Exam Updated Vital Signs BP (!) 114/98   Pulse 93   Temp 97.7 F (36.5 C)   Resp (!) 28   Ht 4\' 10"  (1.473 m)   Wt 64 kg   SpO2 97% Comment: During neb treatment   BMI 29.47 kg/m   Physical Exam Vitals signs and nursing note reviewed.  Constitutional:      General: She is not in acute distress.    Appearance: She is well-developed. She is not diaphoretic.  HENT:     Head: Normocephalic and atraumatic.     Mouth/Throat:     Mouth: Mucous membranes are moist.     Pharynx: Oropharynx is clear.  Eyes:     Conjunctiva/sclera: Conjunctivae normal.  Neck:     Musculoskeletal: Neck supple.  Cardiovascular:     Rate and Rhythm: Normal rate and regular rhythm.     Pulses: Normal pulses.          Radial pulses are 2+ on the right side and 2+ on the left side.       Posterior tibial pulses are 2+ on the right side and 2+ on the left side.     Heart sounds: Normal heart sounds.  Pulmonary:     Effort: Tachypnea present.     Comments: Increased work of breathing with conversational dyspnea.  Abdominal:     Palpations: Abdomen is soft.     Tenderness: There is abdominal tenderness. There is no guarding.       Comments: Patient's tenderness seems to be mild.  Musculoskeletal:     Right lower leg: No edema.     Left lower leg: No edema.  Lymphadenopathy:     Cervical: No cervical adenopathy.  Skin:    General: Skin is warm and dry.  Neurological:     Mental Status: She is alert and oriented to person, place, and time.     Comments: Sensation grossly intact to light touch and injury to the extremities. Motor function intact in each of the extremities. Strength 4/5 in each of the extremities.  Psychiatric:        Mood and Affect: Mood and affect normal.        Speech: Speech normal.        Behavior: Behavior  normal.      ED Treatments / Results  Labs (all labs ordered are listed, but only abnormal results are displayed) Labs Reviewed  BRAIN NATRIURETIC PEPTIDE - Abnormal; Notable for the following components:      Result Value   B Natriuretic Peptide 1,636.4 (*)    All other components within normal limits  COMPREHENSIVE METABOLIC PANEL - Abnormal; Notable for the following components:   Potassium 3.4 (*)    Glucose, Bld 203 (*)    Creatinine, Ser 1.59 (*)    Calcium 8.2 (*)    GFR calc non Af Amer 29 (*)    GFR calc Af Amer 33 (*)    Anion gap 17 (*)    All other components within normal limits  URINALYSIS, ROUTINE W REFLEX MICROSCOPIC - Abnormal; Notable for the following components:   APPearance HAZY (*)    Hgb urine dipstick SMALL (*)    Leukocytes,  UA TRACE (*)    All other components within normal limits  CBC WITH DIFFERENTIAL/PLATELET - Abnormal; Notable for the following components:   WBC 13.4 (*)    MCV 104.5 (*)    RDW 15.6 (*)    Neutro Abs 11.2 (*)    Abs Immature Granulocytes 0.17 (*)    All other components within normal limits  D-DIMER, QUANTITATIVE (NOT AT [facility]) - Abnormal; Notable for the following components:   D-Dimer, Quant 0.63 (*)    All other components within normal limits  I-STAT TROPONIN, ED - Abnormal; Notable for the following components:   Troponin i, poc 2.01 (*)    All other components within normal limits  I-STAT CG4 LACTIC ACID, ED - Abnormal; Notable for the following components:   Lactic Acid, Venous 6.42 (*)    All other components within normal limits  I-STAT CG4 LACTIC ACID, ED - Abnormal; Notable for the following components:   Lactic Acid, Venous 7.25 (*)    All other components within normal limits  CULTURE, BLOOD (ROUTINE X 2)  CULTURE, BLOOD (ROUTINE X 2)  URINE CULTURE  RESPIRATORY PANEL BY PCR  INFLUENZA PANEL BY PCR (TYPE A & B)  PROTIME-INR  APTT  CBC  HEPARIN LEVEL (UNFRACTIONATED)  TROPONIN I  TROPONIN I  TROPONIN I     EKG EKG Interpretation  Date/Time:  Monday November 10 2018 19:07:53 EST Ventricular Rate:  91 PR Interval:    QRS Duration: 102 QT Interval:  443 QTC Calculation: 546 R Axis:   -24 Text Interpretation:  Sinus rhythm Borderline left axis deviation Nonspecific T abnrm, anterolateral leads Prolonged QT interval Confirmed by Virgel Manifold [id number]) on 11/10/2018 7:28:27 PM   Radiology Ct Abdomen Pelvis Wo Contrast  Result Date: 11/10/2018 CLINICAL DATA:  Shortness of breath and generalized abdominal discomfort. History of COPD. EXAM: CT ABDOMEN AND PELVIS WITHOUT CONTRAST TECHNIQUE: Multidetector CT imaging of the abdomen and pelvis was performed following the standard protocol without IV contrast. COMPARISON:  10/02/2018 FINDINGS: Lower chest: Lung bases demonstrate chronic interstitial changes consistent with usual interstitial pneumonitis. There is interstitial fibrosis, mostly peripheral. Bronchiectasis and honeycomb changes. No superimposed consolidation visualized. Similar appearance to previous study. Hepatobiliary: No focal liver abnormality is seen. No gallstones, gallbladder wall thickening, or biliary dilatation. Pancreas: Unremarkable. No pancreatic ductal dilatation or surrounding inflammatory changes. Spleen: Normal in size without focal abnormality. Adrenals/Urinary Tract: No adrenal gland nodules. Kidneys are symmetrical. No hydronephrosis or hydroureter. No renal, ureteral, or bladder stones. No bladder wall thickening. Stomach/Bowel: Stomach, small bowel, and colon are not abnormally distended. There is a prominent diverticulum on the second portion of the duodenum. No wall thickening or inflammatory changes in the bowel. Diverticulosis of the sigmoid colon without evidence of diverticulitis. Appendix is normal. Vascular/Lymphatic: Aortic atherosclerosis. No enlarged abdominal or pelvic lymph nodes. Reproductive: Uterus and bilateral adnexa are unremarkable. Other: No abdominal wall  hernia or abnormality. No abdominopelvic ascites. Musculoskeletal: Degenerative changes in the spine and hips. IMPRESSION: No acute process demonstrated in the abdomen or pelvis. No evidence of bowel obstruction or inflammation. Diverticulosis of the colon without evidence of diverticulitis. Chronic interstitial changes in the lung bases. Aortic Atherosclerosis (ICD10-I70.0). Electronically Signed   By: Lucienne Capers M.D.   On: 11/10/2018 22:55   Dg Chest 2 View  Result Date: 11/10/2018 CLINICAL DATA:  Shortness of breath over 1 day. History of COPD. Home oxygen. EXAM: CHEST - 2 VIEW COMPARISON:  10/08/2018 FINDINGS: Mild cardiac enlargement. Low lung volumes. No  vascular congestion or edema. Diffuse interstitial infiltrates in the lungs unchanged since previous studies consistent with chronic interstitial fibrosis. No blunting of costophrenic angles. No pneumothorax. Mediastinal contours appear intact. Calcification of the aorta. Esophageal hiatal hernia behind the heart. Degenerative changes in the spine and shoulders. IMPRESSION: Chronic interstitial fibrosis in the lungs. No evidence of active pulmonary disease. Electronically Signed   By: Lucienne Capers M.D.   On: 11/10/2018 19:10    Procedures .Critical Care Performed by: Lorayne Bender, PA-C Authorized by: Lorayne Bender, PA-C   Critical care provider statement:    Critical care time (minutes):  60   Critical care time was exclusive of:  Separately billable procedures and treating other patients   Critical care was necessary to treat or prevent imminent or life-threatening deterioration of the following conditions:  Shock   Critical care was time spent personally by me on the following activities:  Pulse oximetry, ordering and review of radiographic studies, ordering and review of laboratory studies, ordering and performing treatments and interventions, re-evaluation of patient's condition, development of treatment plan with patient or  surrogate, discussions with consultants, evaluation of patient's response to treatment, examination of patient and obtaining history from patient or surrogate   I assumed direction of critical care for this patient from another provider in my specialty: no     (including critical care time)  Medications Ordered in ED Medications  metroNIDAZOLE (FLAGYL) IVPB 500 mg (0 mg Intravenous Stopped 11/11/18 0037)  heparin ADULT infusion 100 units/mL (25000 units/214mL sodium chloride 0.45%) (750 Units/hr Intravenous New Bag/Given 11/10/18 2213)  potassium chloride 10 mEq in 100 mL IVPB (has no administration in time range)  sodium chloride 0.9 % bolus 1,000 mL (0 mLs Intravenous Stopped 11/10/18 2034)  sodium chloride 0.9 % bolus 1,000 mL (0 mLs Intravenous Stopped 11/10/18 2358)  ceFEPIme (MAXIPIME) 2 g in sodium chloride 0.9 % 100 mL IVPB (0 g Intravenous Stopped 11/10/18 2125)  vancomycin (VANCOCIN) 1,500 mg in sodium chloride 0.9 % 500 mL IVPB (0 mg Intravenous Stopped 11/10/18 2358)  aspirin chewable tablet 324 mg (324 mg Oral Given 11/10/18 2102)     Initial Impression / Assessment and Plan / ED Course  I have reviewed the triage vital signs and the nursing notes.  Pertinent labs & imaging results that were available during my care of the patient were reviewed by me and considered in my medical decision making (see chart for details).  Clinical Course as of Nov 11 122  Mon Nov 10, 2018  2027 Spoke with Dr. Marletta Lor, Cardiology Fellow. Suggests transfer to [facility], suggests ICU, for shock.  Start low-dose heparin without bolus as well as aspirin. Have medical admission team contact her once patient arrives at [facility].   [SJ]  2106 Spoke with Dr. Oletta Darter, [place]. He will put the patient in for consult versus admission.  Somebody from the critical care team will come evaluate the patient for admission to critical care. He advises to speak with the patient and her family and determine if they are  even interested in high risk PCI if that is even an option, especially in light of her DNR status. If she is not interested, there is no point to bring her to [facility].   [SJ]  2124 Spoke with Dr. Nicki Reaper, CCM at [facility]. States if patient is DNR/DNI, she would not be able to offer any further management for this patient, including vasopressors. Therefore, patient would not be appropriate for ICU  admission.   [SJ]  2152 Spoke with patient about her overall wishes for her care.  She confirms she does want to remain DNR/DNI.  She also declines any invasive interventions, such as PCI.   [SJ]  2203 Updated Dr. Marletta Lor, cardiology fellow, on decision to admit patient here at [facility].   [SJ]    Clinical Course User Index [SJ] Lorayne Bender, PA-C   Patient presents with complaint of shortness of breath.  Initially, she was not hypotensive upon her arrival in the ED.  Once hypotension arose, she was fluid resuscitated.  Her blood pressure seemed to respond well to IV fluids.  She was noted to have a lactic acidosis and leukocytosis, sepsis order set was initiated.  No evidence of acute infection on chest x-ray. Sepsis reevaluation completed.   Patient has a mildly elevated d-dimer, however, contrasted scan not possible due to patient's GFR.  On repeat assessments, patient did not show signs of deterioration while she was under my care.  She showed no evidence of rales, lower extremity edema, or orthopnea, despite her elevated BNP.  Troponin elevated.  She was noted to have an elevated troponin during a recent admission that seemed to be secondary to urosepsis.  On previous notes it was also mentioned that patient is not a candidate for invasive cardiac procedures or surgeries.  A challenge that permeated the patient's care during her ED course was helping her decide whether she wanted to keep her DNR in place or revoke it and allow more aggressive resuscitation, such as vasopressors.  Patient  ultimately stood firm on her desire to keep the DNR in place.  Patient admitted for any further management.   Findings and plan of care discussed with Virgel Manifold, MD. Dr. Wilson Singer personally evaluated and examined this patient.    Vitals:   11/10/18 2008 11/10/18 2030 11/10/18 2048 11/10/18 2114  BP: 98/78 (!) 78/58 90/61 (!) 101/54  Pulse: 88 82 84 83  Resp: (!) 23 20 (!) 24 (!) 25  Temp:      SpO2: 100% 98% 97% 99%  Weight:      Height:       Vitals:   11/10/18 2315 11/10/18 2345 11/11/18 0015 11/11/18 0100  BP: 104/61 96/73 (!) 92/48 (!) 106/58  Pulse: 85 85 85 85  Resp: (!) 27 (!) 26 (!) 23 (!) 25  Temp:      SpO2: 96% 97% 97% 92%  Weight:      Height:         Final Clinical Impressions(s) / ED Diagnoses   Final diagnoses:  SOB (shortness of breath)    ED Discharge Orders    None       Layla Maw 11/11/18 0135    Virgel Manifold, MD 11/18/18 0900

## 2018-11-11 ENCOUNTER — Other Ambulatory Visit (HOSPITAL_COMMUNITY): Payer: Commercial Managed Care - PPO

## 2018-11-11 ENCOUNTER — Inpatient Hospital Stay (HOSPITAL_COMMUNITY): Payer: PPO

## 2018-11-11 DIAGNOSIS — Z9861 Coronary angioplasty status: Secondary | ICD-10-CM

## 2018-11-11 DIAGNOSIS — J8410 Pulmonary fibrosis, unspecified: Secondary | ICD-10-CM

## 2018-11-11 DIAGNOSIS — K219 Gastro-esophageal reflux disease without esophagitis: Secondary | ICD-10-CM

## 2018-11-11 DIAGNOSIS — J449 Chronic obstructive pulmonary disease, unspecified: Secondary | ICD-10-CM

## 2018-11-11 DIAGNOSIS — I739 Peripheral vascular disease, unspecified: Secondary | ICD-10-CM

## 2018-11-11 DIAGNOSIS — I5042 Chronic combined systolic (congestive) and diastolic (congestive) heart failure: Secondary | ICD-10-CM

## 2018-11-11 DIAGNOSIS — I2510 Atherosclerotic heart disease of native coronary artery without angina pectoris: Secondary | ICD-10-CM

## 2018-11-11 DIAGNOSIS — J9690 Respiratory failure, unspecified, unspecified whether with hypoxia or hypercapnia: Secondary | ICD-10-CM | POA: Diagnosis present

## 2018-11-11 DIAGNOSIS — R7989 Other specified abnormal findings of blood chemistry: Secondary | ICD-10-CM

## 2018-11-11 DIAGNOSIS — I248 Other forms of acute ischemic heart disease: Secondary | ICD-10-CM

## 2018-11-11 LAB — BLOOD CULTURE ID PANEL (REFLEXED)
Acinetobacter baumannii: NOT DETECTED
Candida albicans: NOT DETECTED
Candida glabrata: NOT DETECTED
Candida krusei: NOT DETECTED
Candida parapsilosis: NOT DETECTED
Candida tropicalis: NOT DETECTED
ENTEROCOCCUS SPECIES: NOT DETECTED
Enterobacter cloacae complex: NOT DETECTED
Enterobacteriaceae species: NOT DETECTED
Escherichia coli: NOT DETECTED
Haemophilus influenzae: NOT DETECTED
Klebsiella oxytoca: NOT DETECTED
Klebsiella pneumoniae: NOT DETECTED
LISTERIA MONOCYTOGENES: NOT DETECTED
Neisseria meningitidis: NOT DETECTED
Proteus species: NOT DETECTED
Pseudomonas aeruginosa: NOT DETECTED
STREPTOCOCCUS AGALACTIAE: NOT DETECTED
Serratia marcescens: NOT DETECTED
Staphylococcus aureus (BCID): NOT DETECTED
Staphylococcus species: NOT DETECTED
Streptococcus pneumoniae: NOT DETECTED
Streptococcus pyogenes: NOT DETECTED
Streptococcus species: DETECTED — AB

## 2018-11-11 LAB — RESPIRATORY PANEL BY PCR
ADENOVIRUS-RVPPCR: NOT DETECTED
Bordetella pertussis: NOT DETECTED
CORONAVIRUS NL63-RVPPCR: NOT DETECTED
CORONAVIRUS OC43-RVPPCR: NOT DETECTED
Chlamydophila pneumoniae: NOT DETECTED
Coronavirus 229E: NOT DETECTED
Coronavirus HKU1: NOT DETECTED
Influenza A: NOT DETECTED
Influenza B: NOT DETECTED
Metapneumovirus: NOT DETECTED
Mycoplasma pneumoniae: NOT DETECTED
Parainfluenza Virus 1: NOT DETECTED
Parainfluenza Virus 2: NOT DETECTED
Parainfluenza Virus 3: NOT DETECTED
Parainfluenza Virus 4: NOT DETECTED
Respiratory Syncytial Virus: NOT DETECTED
Rhinovirus / Enterovirus: NOT DETECTED

## 2018-11-11 LAB — COMPREHENSIVE METABOLIC PANEL
ALT: 16 U/L (ref 0–44)
AST: 25 U/L (ref 15–41)
Albumin: 3 g/dL — ABNORMAL LOW (ref 3.5–5.0)
Alkaline Phosphatase: 48 U/L (ref 38–126)
Anion gap: 7 (ref 5–15)
BUN: 25 mg/dL — ABNORMAL HIGH (ref 8–23)
CO2: 22 mmol/L (ref 22–32)
Calcium: 7.7 mg/dL — ABNORMAL LOW (ref 8.9–10.3)
Chloride: 113 mmol/L — ABNORMAL HIGH (ref 98–111)
Creatinine, Ser: 1.05 mg/dL — ABNORMAL HIGH (ref 0.44–1.00)
GFR calc non Af Amer: 47 mL/min — ABNORMAL LOW (ref >60)
GFR, EST AFRICAN AMERICAN: 55 mL/min — AB (ref >60)
Glucose, Bld: 107 mg/dL — ABNORMAL HIGH (ref 70–99)
Potassium: 4.4 mmol/L (ref 3.5–5.1)
Sodium: 142 mmol/L (ref 135–145)
Total Bilirubin: 0.7 mg/dL (ref 0.3–1.2)
Total Protein: 5.7 g/dL — ABNORMAL LOW (ref 6.5–8.1)

## 2018-11-11 LAB — HEPARIN LEVEL (UNFRACTIONATED)
HEPARIN UNFRACTIONATED: 0.25 [IU]/mL — AB (ref 0.30–0.70)
Heparin Unfractionated: 0.35 IU/mL (ref 0.30–0.70)

## 2018-11-11 LAB — CBC
HCT: 36.8 % (ref 36.0–46.0)
HCT: 35.9 % — ABNORMAL LOW (ref 36.0–46.0)
Hemoglobin: 11.3 g/dL — ABNORMAL LOW (ref 12.0–15.0)
Hemoglobin: 11.3 g/dL — ABNORMAL LOW (ref 12.0–15.0)
MCH: 31.3 pg (ref 26.0–34.0)
MCH: 32.6 pg (ref 26.0–34.0)
MCHC: 30.7 g/dL (ref 30.0–36.0)
MCHC: 31.5 g/dL (ref 30.0–36.0)
MCV: 101.9 fL — ABNORMAL HIGH (ref 80.0–100.0)
MCV: 103.5 fL — ABNORMAL HIGH (ref 80.0–100.0)
PLATELETS: 187 10*3/uL (ref 150–400)
Platelets: 185 10*3/uL (ref 150–400)
RBC: 3.61 MIL/uL — ABNORMAL LOW (ref 3.87–5.11)
RBC: 3.47 MIL/uL — ABNORMAL LOW (ref 3.87–5.11)
RDW: 15.2 % (ref 11.5–15.5)
RDW: 15.5 % (ref 11.5–15.5)
WBC: 13.3 10*3/uL — ABNORMAL HIGH (ref 4.0–10.5)
WBC: 23.8 10*3/uL — ABNORMAL HIGH (ref 4.0–10.5)
nRBC: 0 % (ref 0.0–0.2)
nRBC: 0 % (ref 0.0–0.2)

## 2018-11-11 LAB — URINALYSIS, ROUTINE W REFLEX MICROSCOPIC
BILIRUBIN URINE: NEGATIVE
Bacteria, UA: NONE SEEN
Glucose, UA: NEGATIVE mg/dL
Ketones, ur: NEGATIVE mg/dL
Nitrite: NEGATIVE
Protein, ur: NEGATIVE mg/dL
SPECIFIC GRAVITY, URINE: 1.017 (ref 1.005–1.030)
pH: 5 (ref 5.0–8.0)

## 2018-11-11 LAB — PROCALCITONIN

## 2018-11-11 LAB — TROPONIN I
Troponin I: 1.67 ng/mL (ref <0.03)
Troponin I: 1.18 ng/mL (ref <0.03)
Troponin I: 0.88 ng/mL (ref <0.03)

## 2018-11-11 LAB — LACTIC ACID, PLASMA
Lactic Acid, Venous: 2.7 mmol/L (ref 0.5–1.9)
Lactic Acid, Venous: 3.2 mmol/L (ref 0.5–1.9)

## 2018-11-11 LAB — PHOSPHORUS: Phosphorus: 3.4 mg/dL (ref 2.5–4.6)

## 2018-11-11 LAB — TSH: TSH: 0.345 u[IU]/mL — AB (ref 0.350–4.500)

## 2018-11-11 LAB — MAGNESIUM: Magnesium: 1.9 mg/dL (ref 1.7–2.4)

## 2018-11-11 MED ORDER — FUROSEMIDE 40 MG PO TABS
40.0000 mg | ORAL_TABLET | Freq: Two times a day (BID) | ORAL | Status: DC
  Administered 2018-11-11 – 2018-11-14 (×4): 40 mg via ORAL
  Filled 2018-11-11 (×5): qty 1

## 2018-11-11 MED ORDER — SODIUM CHLORIDE 0.9 % IV SOLN
1.0000 g | INTRAVENOUS | Status: DC
  Filled 2018-11-11: qty 1

## 2018-11-11 MED ORDER — METOPROLOL SUCCINATE ER 25 MG PO TB24
12.5000 mg | ORAL_TABLET | Freq: Every day | ORAL | Status: DC
  Administered 2018-11-11 – 2018-11-19 (×4): 12.5 mg via ORAL
  Filled 2018-11-11 (×9): qty 1

## 2018-11-11 MED ORDER — ALPRAZOLAM 0.25 MG PO TABS
0.2500 mg | ORAL_TABLET | Freq: Three times a day (TID) | ORAL | Status: DC | PRN
  Administered 2018-11-12 – 2018-11-18 (×2): 0.25 mg via ORAL
  Filled 2018-11-11 (×2): qty 1

## 2018-11-11 MED ORDER — HEPARIN (PORCINE) 25000 UT/250ML-% IV SOLN
850.0000 [IU]/h | INTRAVENOUS | Status: DC
  Administered 2018-11-12: 850 [IU]/h via INTRAVENOUS
  Filled 2018-11-11: qty 250

## 2018-11-11 MED ORDER — SIMVASTATIN 40 MG PO TABS
40.0000 mg | ORAL_TABLET | Freq: Every day | ORAL | Status: DC
  Administered 2018-11-11 – 2018-11-18 (×7): 40 mg via ORAL
  Filled 2018-11-11 (×8): qty 1

## 2018-11-11 MED ORDER — IPRATROPIUM-ALBUTEROL 0.5-2.5 (3) MG/3ML IN SOLN
3.0000 mL | RESPIRATORY_TRACT | Status: DC | PRN
  Administered 2018-11-11: 3 mL via RESPIRATORY_TRACT

## 2018-11-11 MED ORDER — HYDROCODONE-ACETAMINOPHEN 5-325 MG PO TABS: 1.0000 | ORAL_TABLET | ORAL | Status: DC | PRN

## 2018-11-11 MED ORDER — ALPRAZOLAM 0.25 MG PO TABS
0.2500 mg | ORAL_TABLET | Freq: Three times a day (TID) | ORAL | Status: DC | PRN
  Administered 2018-11-11 – 2018-11-16 (×4): 0.25 mg via ORAL
  Filled 2018-11-11 (×4): qty 1

## 2018-11-11 MED ORDER — ISOSORBIDE MONONITRATE ER 30 MG PO TB24
30.0000 mg | ORAL_TABLET | Freq: Every day | ORAL | Status: DC
  Administered 2018-11-11 – 2018-11-12 (×2): 30 mg via ORAL
  Filled 2018-11-11 (×6): qty 1

## 2018-11-11 MED ORDER — SODIUM CHLORIDE 0.9 % IV SOLN
INTRAVENOUS | Status: AC
  Administered 2018-11-11: 18:00:00 via INTRAVENOUS

## 2018-11-11 MED ORDER — SERTRALINE HCL 50 MG PO TABS
50.0000 mg | ORAL_TABLET | Freq: Every day | ORAL | Status: DC
  Administered 2018-11-11 – 2018-11-19 (×9): 50 mg via ORAL
  Filled 2018-11-11 (×9): qty 1

## 2018-11-11 MED ORDER — METHYLPREDNISOLONE SODIUM SUCC 40 MG IJ SOLR
40.0000 mg | INTRAMUSCULAR | Status: DC
  Administered 2018-11-11 – 2018-11-13 (×3): 40 mg via INTRAVENOUS
  Filled 2018-11-11 (×3): qty 1

## 2018-11-11 MED ORDER — MOMETASONE FURO-FORMOTEROL FUM 200-5 MCG/ACT IN AERO
1.0000 | INHALATION_SPRAY | Freq: Two times a day (BID) | RESPIRATORY_TRACT | Status: DC
  Administered 2018-11-11 – 2018-11-19 (×15): 1 via RESPIRATORY_TRACT
  Filled 2018-11-11 (×2): qty 8.8

## 2018-11-11 MED ORDER — ACETAMINOPHEN 325 MG PO TABS: 650.0000 mg | ORAL_TABLET | Freq: Four times a day (QID) | ORAL | Status: DC | PRN

## 2018-11-11 MED ORDER — SERTRALINE HCL 50 MG PO TABS: 50.0000 mg | ORAL_TABLET | Freq: Every day | ORAL | Status: DC

## 2018-11-11 MED ORDER — METHYLPREDNISOLONE SODIUM SUCC 125 MG IJ SOLR
60.0000 mg | Freq: Two times a day (BID) | INTRAMUSCULAR | Status: DC
  Administered 2018-11-11: 60 mg via INTRAVENOUS
  Filled 2018-11-11: qty 2

## 2018-11-11 MED ORDER — NITROGLYCERIN 0.4 MG SL SUBL: 0.4000 mg | SUBLINGUAL_TABLET | SUBLINGUAL | Status: DC | PRN

## 2018-11-11 MED ORDER — VANCOMYCIN HCL IN DEXTROSE 750-5 MG/150ML-% IV SOLN: 750.0000 mg | INTRAVENOUS | Status: DC

## 2018-11-11 MED ORDER — LEVALBUTEROL HCL 0.63 MG/3ML IN NEBU: 0.6300 mg | INHALATION_SOLUTION | Freq: Four times a day (QID) | RESPIRATORY_TRACT | Status: DC | PRN

## 2018-11-11 MED ORDER — ASPIRIN EC 81 MG PO TBEC
81.0000 mg | DELAYED_RELEASE_TABLET | Freq: Every day | ORAL | Status: DC
  Administered 2018-11-12 – 2018-11-18 (×7): 81 mg via ORAL
  Filled 2018-11-11 (×8): qty 1

## 2018-11-11 MED ORDER — ACETAMINOPHEN 650 MG RE SUPP: 650.0000 mg | Freq: Four times a day (QID) | RECTAL | Status: DC | PRN

## 2018-11-11 MED ORDER — FUROSEMIDE 10 MG/ML IJ SOLN
60.0000 mg | Freq: Once | INTRAMUSCULAR | Status: AC
  Administered 2018-11-11: 60 mg via INTRAVENOUS
  Filled 2018-11-11: qty 6

## 2018-11-11 MED ORDER — EZETIMIBE 10 MG PO TABS
10.0000 mg | ORAL_TABLET | Freq: Every day | ORAL | Status: DC
  Administered 2018-11-11 – 2018-11-19 (×9): 10 mg via ORAL
  Filled 2018-11-11 (×9): qty 1

## 2018-11-11 MED ORDER — IPRATROPIUM-ALBUTEROL 0.5-2.5 (3) MG/3ML IN SOLN
3.0000 mL | Freq: Four times a day (QID) | RESPIRATORY_TRACT | Status: DC
  Administered 2018-11-12 – 2018-11-13 (×6): 3 mL via RESPIRATORY_TRACT
  Filled 2018-11-11 (×7): qty 3

## 2018-11-11 MED ORDER — PREDNISONE 20 MG PO TABS: 40.0000 mg | ORAL_TABLET | Freq: Every day | ORAL | Status: DC

## 2018-11-11 MED ORDER — POTASSIUM CHLORIDE 10 MEQ/100ML IV SOLN
10.0000 meq | INTRAVENOUS | Status: AC
  Administered 2018-11-11 (×2): 10 meq via INTRAVENOUS
  Filled 2018-11-11 (×2): qty 100

## 2018-11-11 NOTE — Progress Notes (Signed)
Pharmacy Antibiotic Note  Linda Kelly is a 85 y.o. female admitted on 11/10/2018 with sepsis.  Pharmacy has been consulted for vancomycin and cefepime dosing. First doses given in ED yesterday  Plan:  Vancomycin 1500 mg IV given yesterday; continue with 750 mg IV q48 hr (est AUC 465 based on SCr 1.59)  Measure vancomycin AUC at steady state as indicated  Cefepime 1 g IV q24 hr  SCr q48 hr  Height: 4\' 10"  (147.3 cm) Weight: 141 lb (64 kg) IBW/kg (Calculated) : 40.9  Temp (24hrs), Avg:97.7 F (36.5 C), Min:97.7 F (36.5 C), Max:97.7 F (36.5 C)  Recent Labs  Lab 11/10/18 1816 11/10/18 1932 11/10/18 2121 11/11/18 0829  WBC 13.4*  --   --  13.3*  CREATININE 1.59*  --   --   --   LATICACIDVEN  --  6.42* 7.25*  --     Estimated Creatinine Clearance: 19.3 mL/min (A) (by C-G formula based on SCr of 1.59 mg/dL (H)).    No Known Allergies  Antimicrobials this admission: 1/13 vancomycin >>  1/13 cefepime >>   Dose adjustments this admission:  Microbiology results: 1/13 BCx: ngtd 1/13 UCx: sent  1/13 resp panel: neg  Thank you for allowing pharmacy to be a part of this patient's care.  Reuel Boom, PharmD, BCPS [phone] 11/11/2018, 2:19 PM

## 2018-11-11 NOTE — Progress Notes (Addendum)
ANTICOAGULATION CONSULT NOTE  Pharmacy Consult for Heparin Indication: NSTEMI  No Known Allergies  Patient Measurements: Height: 4\' 10"  (147.3 cm) Weight: 141 lb (64 kg) IBW/kg (Calculated) : 40.9 HEPARIN DW (KG): 55  Vital Signs: BP: 152/84 (01/14 0930) Pulse Rate: 78 (01/14 0930)  Labs: Recent Labs    11/10/18 1816 11/10/18 1938 11/10/18 2341 11/11/18 0829  HGB 13.7  --   --  11.3*  HCT 44.1  --   --  36.8  PLT 232  --   --  187  APTT  --  24  --   --   LABPROT  --  14.0  --   --   INR  --  1.09  --   --   HEPARINUNFRC  --   --   --  0.35  CREATININE 1.59*  --   --   --   TROPONINI  --   --  1.67* 1.18*    Estimated Creatinine Clearance: 19.3 mL/min (A) (by C-G formula based on SCr of 1.59 mg/dL (H)).   Medications:  No blood thinners PTA Infusions:  . heparin 750 Units/hr (11/11/18 0648)  . metronidazole Stopped (11/11/18 7782)     Assessment: 85 yo F with hx CAD presents with NSTEMI with plans for medical management.     Plan:  No bolus per cardiology Start heparin infusion at 750 units/hr Check anti-Xa level in 8 hours and daily while on heparin Continue to monitor H&H and platelets    Baseline INR, aPTT: WNL  Prior anticoagulation: none  Significant events:  Today, 11/11/2018:  CBC: Hgb lower, suspect dilution; Plt lower but WNL  Most recent heparin level therapeutic on 750 units/hr  Troponin improved  RN did note minor bleeding at lab draw site which continues to bleed at time of lab resulting  Goal of Therapy: Heparin level 0.3-0.5 units/ml Monitor platelets by anticoagulation protocol: Yes  Plan:  Continue heparin IV infusion at 750 units/hr; keep at low end of therapeutic range given bleeding at venipuncture site and likely heparin accumulation with CrCl < 20 ml/min; no bolus per MD  Recheck confirmatory heparin level in 8 hrs  Daily CBC, daily heparin level once stable  Monitor for signs of bleeding or thrombosis - RN will  contact MD if bleeding worsens requiring heparin stopping   Reuel Boom, PharmD, BCPS [phone] 11/11/2018, 9:38 AM

## 2018-11-11 NOTE — ED Notes (Signed)
Gave report to [facility], RN for [phone].

## 2018-11-11 NOTE — Progress Notes (Signed)
PHARMACY - PHYSICIAN COMMUNICATION CRITICAL VALUE ALERT - BLOOD CULTURE IDENTIFICATION (BCID)  Linda Kelly is an 85 y.o. female who presented to [facility] on 11/10/2018 with a chief complaint of dyspnea  Assessment:  Originally started on abx, now off    Name of physician (or Provider) Contacted: Dr. Cathlean Sauer  Current antibiotics: None  Changes to prescribed antibiotics recommended:  Continue to monitor off abx   Results for orders placed or performed during the hospital encounter of 11/10/18  Blood Culture ID Panel (Reflexed) (Collected: 11/10/2018  7:31 PM)  Result Value Ref Range   Enterococcus species NOT DETECTED NOT DETECTED   Listeria monocytogenes NOT DETECTED NOT DETECTED   Staphylococcus species NOT DETECTED NOT DETECTED   Staphylococcus aureus (BCID) NOT DETECTED NOT DETECTED   Streptococcus species DETECTED (A) NOT DETECTED   Streptococcus agalactiae NOT DETECTED NOT DETECTED   Streptococcus pneumoniae NOT DETECTED NOT DETECTED   Streptococcus pyogenes NOT DETECTED NOT DETECTED   Acinetobacter baumannii NOT DETECTED NOT DETECTED   Enterobacteriaceae species NOT DETECTED NOT DETECTED   Enterobacter cloacae complex NOT DETECTED NOT DETECTED   Escherichia coli NOT DETECTED NOT DETECTED   Klebsiella oxytoca NOT DETECTED NOT DETECTED   Klebsiella pneumoniae NOT DETECTED NOT DETECTED   Proteus species NOT DETECTED NOT DETECTED   Serratia marcescens NOT DETECTED NOT DETECTED   Haemophilus influenzae NOT DETECTED NOT DETECTED   Neisseria meningitidis NOT DETECTED NOT DETECTED   Pseudomonas aeruginosa NOT DETECTED NOT DETECTED   Candida albicans NOT DETECTED NOT DETECTED   Candida glabrata NOT DETECTED NOT DETECTED   Candida krusei NOT DETECTED NOT DETECTED   Candida parapsilosis NOT DETECTED NOT DETECTED   Candida tropicalis NOT DETECTED NOT DETECTED    Royetta Asal, PharmD, BCPS Pager [phone] 11/11/2018 5:11 PM

## 2018-11-11 NOTE — Progress Notes (Signed)
ANTICOAGULATION CONSULT NOTE  Pharmacy Consult for Heparin Indication: NSTEMI  No Known Allergies  Patient Measurements: Height: 4\' 11"  (149.9 cm) Weight: 150 lb 9.6 oz (68.3 kg) IBW/kg (Calculated) : 43.2 HEPARIN DW (KG): 58.3  Vital Signs: Temp: 97.7 F (36.5 C) (01/14 1652) Temp Source: Oral (01/14 1652) BP: 100/70 (01/14 1652) Pulse Rate: 88 (01/14 1652)  Labs: Recent Labs    11/10/18 1816 11/10/18 1938 11/10/18 2341 11/11/18 0829 11/11/18 1731  HGB 13.7  --   --  11.3* 11.3*  HCT 44.1  --   --  36.8 35.9*  PLT 232  --   --  187 185  APTT  --  24  --   --   --   LABPROT  --  14.0  --   --   --   INR  --  1.09  --   --   --   HEPARINUNFRC  --   --   --  0.35 0.25*  CREATININE 1.59*  --   --   --   --   TROPONINI  --   --  1.67* 1.18*  --     Estimated Creatinine Clearance: 20.5 mL/min (A) (by C-G formula based on SCr of 1.59 mg/dL (H)).   Medications:  No blood thinners PTA Infusions:  . sodium chloride 75 mL/hr at 11/11/18 1730  . heparin 750 Units/hr (11/11/18 9292)     Assessment: 85 yo F with hx CAD presents with NSTEMI with plans for medical management.     No bolus per cardiology Prior anticoagulation: none  Significant events:  Today, 11/11/2018:  CBC: Hgb lower, suspect dilution; Plt lower but WNL  Most recent heparin level therapeutic on 750 units/hr  Troponin improved  RN did note minor bleeding at lab draw site which continues to bleed at time of lab resulting  2nd shift update: Repeat heparin level = 0.25 with heparin continuing @ 750 units/hr (now subtherapeutic) No further complications noted  Goal of Therapy: Heparin level 0.3-0.5 units/ml Monitor platelets by anticoagulation protocol: Yes  Plan:  Increase heparin IV infusion to 850 units/hr; keep at low end of therapeutic range given bleeding at venipuncture site and likely heparin accumulation with CrCl < 20 ml/min; no bolus per MD  Check heparin level 8 hrs after  heparin rate increased  Daily CBC, daily heparin level once stable  Monitor for signs of bleeding or thrombosis - RN will contact MD if bleeding worsens requiring heparin stopping   Leone Haven, PharmD 11/11/2018, 6:13 PM

## 2018-11-11 NOTE — ED Notes (Signed)
ED TO INPATIENT HANDOFF REPORT  Name/Age/Gender Linda Kelly 85 y.o. female  Code Status Code Status History    Date Active Date Inactive Code Status Order ID Comments User Context   10/03/2018 0044 10/11/2018 1449 DNR [id number]  Norval Morton, MD ED   01/05/2017 1615 01/09/2017 1753 Partial Code [id number]  Melton Alar, PA-C Inpatient   01/03/2017 1824 01/05/2017 1615 Full Code [id number]  Samella Parr, NP Inpatient   08/10/2016 1531 08/14/2016 1932 Full Code [id number]  Belva Crome, MD Inpatient   08/09/2016 0031 08/10/2016 1531 Full Code [id number]  Jettie Booze, MD Inpatient   03/03/2014 1646 03/05/2014 1545 Full Code [id number]  Ulyses Amor, PA-C Inpatient    Questions for Most Recent Historical Code Status (Order [id number])    Question Answer Comment   In the event of cardiac or respiratory ARREST Do not call a "code blue"    In the event of cardiac or respiratory ARREST Do not perform Intubation, CPR, defibrillation or ACLS    In the event of cardiac or respiratory ARREST Use medication by any route, position, wound care, and other measures to relive pain and suffering. May use oxygen, suction and manual treatment of airway obstruction as needed for comfort.         Advance Directive Documentation     Most Recent Value  Type of Advance Directive  Healthcare Power of Attorney, Living will  Pre-existing out of facility DNR order (yellow form or pink MOST form)  -  "MOST" Form in Place?  -      Home/SNF/Other Home  Chief Complaint Shortness of Breath   Level of Care/Admitting Diagnosis ED Disposition    ED Disposition Condition [place]: [place] [[id number]]  Level of Care: Telemetry [5]  Admit to tele based on following criteria: Other see comments  Comments: elevated troponin  Diagnosis: Respiratory failure [facility]) [[id number]]  Admitting Physician: Tawni Millers [[id number]]  Attending Physician: Tawni Millers [[id number]]  Estimated length of stay: 3 - 4 days  Certification:: I certify this patient will need inpatient services for at least 2 midnights  PT Class (Do Not Modify): Inpatient [101]  PT Acc Code (Do Not Modify): Private [1]       Medical History Past Medical History:  Diagnosis Date  . Anxiety   . Anxiety and depression   . Arthritis   . Benign neoplasm of esophagus   . CAD (coronary artery disease)   . Cancer [facility])    Skin cancer on back  . Carotid artery disease ([place])   . CHF (congestive heart failure) ([place])   . Chronic gastritis   . Colon, diverticulosis   . COPD (chronic obstructive pulmonary disease) ([place])   . Depression   . GERD (gastroesophageal reflux disease)   . Hiatal hernia   . High frequency hearing loss   . Hyperlipidemia   . IBS (irritable bowel syndrome)   . Idiopathic pulmonary fibrosis   . Internal hemorrhoid   . MI (myocardial infarction) ([place]) 1985  . Other nonthrombocytopenic purpuras   . PONV (postoperative nausea and vomiting)   . PVD (peripheral vascular disease) ([place])   . Shortness of breath    with exertion  . Solitary pulmonary nodule   . Stroke [facility])    Hx: of several mini -strokes  . Tinnitus    chronic  . Vertigo     Allergies No Known Allergies  IV Location/Drains/Wounds  Patient Lines/Drains/Airways Status   Active Line/Drains/Airways    Name:   Placement date:   Placement time:   Site:   Days:   Peripheral IV 11/10/18 Left Wrist   11/10/18    1755    Wrist   1          Labs/Imaging Results for orders placed or performed during the hospital encounter of 11/10/18 (from the past 48 hour(s))  Brain natriuretic peptide     Status: Abnormal   Collection Time: 11/10/18  6:15 PM  Result Value Ref Range   B Natriuretic Peptide 1,636.4 (H) 0.0 - 100.0 pg/mL    Comment: Performed at [facility], [place] [address], [place], [place] [address]  Comprehensive metabolic panel     Status: Abnormal    Collection Time: 11/10/18  6:16 PM  Result Value Ref Range   Sodium 143 135 - 145 mmol/L   Potassium 3.4 (L) 3.5 - 5.1 mmol/L   Chloride 102 98 - 111 mmol/L   CO2 24 22 - 32 mmol/L   Glucose, Bld 203 (H) 70 - 99 mg/dL   BUN 23 8 - 23 mg/dL   Creatinine, Ser 1.59 (H) 0.44 - 1.00 mg/dL   Calcium 8.2 (L) 8.9 - 10.3 mg/dL   Total Protein 6.6 6.5 - 8.1 g/dL   Albumin 3.7 3.5 - 5.0 g/dL   AST 31 15 - 41 U/L   ALT 17 0 - 44 U/L   Alkaline Phosphatase 58 38 - 126 U/L   Total Bilirubin 1.2 0.3 - 1.2 mg/dL   GFR calc non Af Amer 29 (L) >60 mL/min   GFR calc Af Amer 33 (L) >60 mL/min   Anion gap 17 (H) 5 - 15    Comment: Performed at [facility], [place] [address], [place], [place] [address]  Urinalysis, Routine w reflex microscopic     Status: Abnormal   Collection Time: 11/10/18  6:16 PM  Result Value Ref Range   Color, Urine YELLOW YELLOW   APPearance HAZY (A) CLEAR   Specific Gravity, Urine 1.017 1.005 - 1.030   pH 5.0 5.0 - 8.0   Glucose, UA NEGATIVE NEGATIVE mg/dL   Hgb urine dipstick SMALL (A) NEGATIVE   Bilirubin Urine NEGATIVE NEGATIVE   Ketones, ur NEGATIVE NEGATIVE mg/dL   Protein, ur NEGATIVE NEGATIVE mg/dL   Nitrite NEGATIVE NEGATIVE   Leukocytes, UA TRACE (A) NEGATIVE   RBC / HPF 6-10 0 - 5 RBC/hpf   WBC, UA 11-20 0 - 5 WBC/hpf   Bacteria, UA NONE SEEN NONE SEEN   Squamous Epithelial / LPF 6-10 0 - 5   Mucus PRESENT    Hyaline Casts, UA PRESENT     Comment: Performed at [facility], [place] [address] [place], [place] [address]  CBC with Differential     Status: Abnormal   Collection Time: 11/10/18  6:16 PM  Result Value Ref Range   WBC 13.4 (H) 4.0 - 10.5 K/uL   RBC 4.22 3.87 - 5.11 MIL/uL   Hemoglobin 13.7 12.0 - 15.0 g/dL   HCT 44.1 36.0 - 46.0 %   MCV 104.5 (H) 80.0 - 100.0 fL   MCH 32.5 26.0 - 34.0 pg   MCHC 31.1 30.0 - 36.0 g/dL   RDW 15.6 (H) 11.5 - 15.5 %   Platelets 232 150 - 400 K/uL   nRBC 0.0 0.0 - 0.2 %    Neutrophils Relative % 85 %   Neutro Abs  11.2 (H) 1.7 - 7.7 K/uL   Lymphocytes Relative 11 %   Lymphs Abs 1.5 0.7 - 4.0 K/uL   Monocytes Relative 3 %   Monocytes Absolute 0.5 0.1 - 1.0 K/uL   Eosinophils Relative 0 %   Eosinophils Absolute 0.0 0.0 - 0.5 K/uL   Basophils Relative 0 %   Basophils Absolute 0.1 0.0 - 0.1 K/uL   Immature Granulocytes 1 %   Abs Immature Granulocytes 0.17 (H) 0.00 - 0.07 K/uL    Comment: Performed at [facility], [place] [address], [place], [place] [address]  D-dimer, quantitative     Status: Abnormal   Collection Time: 11/10/18  6:16 PM  Result Value Ref Range   D-Dimer, Quant 0.63 (H) 0.00 - 0.50 ug/mL-FEU    Comment: (NOTE) At the manufacturer cut-off of 0.50 ug/mL FEU, this assay has been documented to exclude PE with a sensitivity and negative predictive value of 97 to 99%.  At this time, this assay has not been approved by the FDA to exclude DVT/VTE. Results should be correlated with clinical presentation. Performed at [facility], [place] [address], [place], [place] [address]   Influenza panel by PCR (type A & B)     Status: None   Collection Time: 11/10/18  6:32 PM  Result Value Ref Range   Influenza A By PCR NEGATIVE NEGATIVE   Influenza B By PCR NEGATIVE NEGATIVE    Comment: (NOTE) The Xpert Xpress Flu assay is intended as an aid in the diagnosis of  influenza and should not be used as a sole basis for treatment.  This  assay is FDA approved for nasopharyngeal swab specimens only. Nasal  washings and aspirates are unacceptable for Xpert Xpress Flu testing. Performed at [facility], [place] [address], [place], [place] [address]   Culture, blood (routine x 2)     Status: None (Preliminary result)   Collection Time: 11/10/18  7:26 PM  Result Value Ref Range   Specimen Description      RIGHT ANTECUBITAL Performed at [place] [address] [place], [place]  [address]    Special Requests      BOTTLES DRAWN AEROBIC AND ANAEROBIC Blood Culture adequate volume Performed at [place] [address] [place], [place] [address]    Culture      NO GROWTH < 24 HOURS Performed at [place] [address], [place], [place] [address]    Report Status PENDING   I-stat troponin, ED     Status: Abnormal   Collection Time: 11/10/18  7:27 PM  Result Value Ref Range   Troponin i, poc 2.01 (HH) 0.00 - 0.08 ng/mL   Comment NOTIFIED PHYSICIAN    Comment 3            Comment: Due to the release kinetics of cTnI, a negative result within the first hours of the onset of symptoms does not rule out myocardial infarction with certainty. If myocardial infarction is still suspected, repeat the test at appropriate intervals.   Culture, blood (routine x 2)     Status: None (Preliminary result)   Collection Time: 11/10/18  7:31 PM  Result Value Ref Range   Specimen Description      LEFT ANTECUBITAL Performed at [facility], [place] [address], [place], [place] [address]    Special Requests      BOTTLES DRAWN AEROBIC AND ANAEROBIC Blood Culture adequate volume Performed  at [facility], [place] [address], [place], [place] [address]    Culture  Setup Time      GRAM POSITIVE COCCI IN CHAINS ANAEROBIC BOTTLE ONLY Organism ID to follow Performed at [facility], [place] [address], [place], [place] [address]    Culture GRAM POSITIVE COCCI    Report Status PENDING   I-Stat CG4 Lactic Acid, ED     Status: Abnormal   Collection Time: 11/10/18  7:32 PM  Result Value Ref Range   Lactic Acid, Venous 6.42 (HH) 0.5 - 1.9 mmol/L   Comment NOTIFIED PHYSICIAN   Protime-INR     Status: None   Collection Time: 11/10/18  7:38 PM  Result Value Ref Range   Prothrombin Time 14.0 11.4 - 15.2 seconds   INR 1.09     Comment: Performed at [facility], [place] [address], [place], [place]  [address]  APTT     Status: None   Collection Time: 11/10/18  7:38 PM  Result Value Ref Range   aPTT 24 24 - 36 seconds    Comment: Performed at [facility], [place] [address] [place], [place] [address]  I-Stat CG4 Lactic Acid, ED     Status: Abnormal   Collection Time: 11/10/18  9:21 PM  Result Value Ref Range   Lactic Acid, Venous 7.25 (HH) 0.5 - 1.9 mmol/L   Comment NOTIFIED PHYSICIAN   Respiratory Panel by PCR     Status: None   Collection Time: 11/10/18 11:15 PM  Result Value Ref Range   Adenovirus NOT DETECTED NOT DETECTED   Coronavirus 229E NOT DETECTED NOT DETECTED   Coronavirus HKU1 NOT DETECTED NOT DETECTED   Coronavirus NL63 NOT DETECTED NOT DETECTED   Coronavirus OC43 NOT DETECTED NOT DETECTED   Metapneumovirus NOT DETECTED NOT DETECTED   Rhinovirus / Enterovirus NOT DETECTED NOT DETECTED   Influenza A NOT DETECTED NOT DETECTED   Influenza B NOT DETECTED NOT DETECTED   Parainfluenza Virus 1 NOT DETECTED NOT DETECTED   Parainfluenza Virus 2 NOT DETECTED NOT DETECTED   Parainfluenza Virus 3 NOT DETECTED NOT DETECTED   Parainfluenza Virus 4 NOT DETECTED NOT DETECTED   Respiratory Syncytial Virus NOT DETECTED NOT DETECTED   Bordetella pertussis NOT DETECTED NOT DETECTED   Chlamydophila pneumoniae NOT DETECTED NOT DETECTED   Mycoplasma pneumoniae NOT DETECTED NOT DETECTED    Comment: Performed at [facility], [place] [address], [place], [place] [address]  Troponin I - Now Then Q6H     Status: Abnormal   Collection Time: 11/10/18 11:41 PM  Result Value Ref Range   Troponin I 1.67 (HH) <0.03 ng/mL    Comment: CRITICAL RESULT CALLED TO, READ BACK BY AND VERIFIED WITH: HODGES,I RN @0331  ON 11/11/2018 JACKSON,K Performed at [facility], [place] [address], [place], [place] [address]   CBC     Status: Abnormal   Collection Time: 11/11/18  8:29 AM  Result Value Ref Range   WBC 13.3 (H) 4.0 - 10.5 K/uL   RBC 3.61 (L) 3.87 - 5.11 MIL/uL    Hemoglobin 11.3 (L) 12.0 - 15.0 g/dL   HCT 36.8 36.0 - 46.0 %   MCV 101.9 (H) 80.0 - 100.0 fL   MCH 31.3 26.0 - 34.0 pg   MCHC 30.7 30.0 - 36.0 g/dL   RDW 15.2 11.5 - 15.5 %   Platelets 187 150 - 400 K/uL   nRBC 0.0 0.0 - 0.2 %  Comment: Performed at [facility], [place] [address], [place], [place] [address]  Heparin level (unfractionated)     Status: None   Collection Time: 11/11/18  8:29 AM  Result Value Ref Range   Heparin Unfractionated 0.35 0.30 - 0.70 IU/mL    Comment: (NOTE) If heparin results are below expected values, and patient dosage has  been confirmed, suggest follow up testing of antithrombin III levels. Performed at [facility], [place] [address], [place], [place] [address]   Troponin I - Now Then Q6H     Status: Abnormal   Collection Time: 11/11/18  8:29 AM  Result Value Ref Range   Troponin I 1.18 (HH) <0.03 ng/mL    Comment: CRITICAL VALUE NOTED.  VALUE IS CONSISTENT WITH PREVIOUSLY REPORTED AND CALLED VALUE. Performed at [facility], [place] [address], [place], [place] [address]    Ct Abdomen Pelvis Wo Contrast  Result Date: 11/10/2018 CLINICAL DATA:  Shortness of breath and generalized abdominal discomfort. History of COPD. EXAM: CT ABDOMEN AND PELVIS WITHOUT CONTRAST TECHNIQUE: Multidetector CT imaging of the abdomen and pelvis was performed following the standard protocol without IV contrast. COMPARISON:  10/02/2018 FINDINGS: Lower chest: Lung bases demonstrate chronic interstitial changes consistent with usual interstitial pneumonitis. There is interstitial fibrosis, mostly peripheral. Bronchiectasis and honeycomb changes. No superimposed consolidation visualized. Similar appearance to previous study. Hepatobiliary: No focal liver abnormality is seen. No gallstones, gallbladder wall thickening, or biliary dilatation. Pancreas: Unremarkable. No pancreatic ductal dilatation or surrounding inflammatory changes.  Spleen: Normal in size without focal abnormality. Adrenals/Urinary Tract: No adrenal gland nodules. Kidneys are symmetrical. No hydronephrosis or hydroureter. No renal, ureteral, or bladder stones. No bladder wall thickening. Stomach/Bowel: Stomach, small bowel, and colon are not abnormally distended. There is a prominent diverticulum on the second portion of the duodenum. No wall thickening or inflammatory changes in the bowel. Diverticulosis of the sigmoid colon without evidence of diverticulitis. Appendix is normal. Vascular/Lymphatic: Aortic atherosclerosis. No enlarged abdominal or pelvic lymph nodes. Reproductive: Uterus and bilateral adnexa are unremarkable. Other: No abdominal wall hernia or abnormality. No abdominopelvic ascites. Musculoskeletal: Degenerative changes in the spine and hips. IMPRESSION: No acute process demonstrated in the abdomen or pelvis. No evidence of bowel obstruction or inflammation. Diverticulosis of the colon without evidence of diverticulitis. Chronic interstitial changes in the lung bases. Aortic Atherosclerosis (ICD10-I70.0). Electronically Signed   By: Lucienne Capers M.D.   On: 11/10/2018 22:55   Dg Chest 2 View  Result Date: 11/10/2018 CLINICAL DATA:  Shortness of breath over 1 day. History of COPD. Home oxygen. EXAM: CHEST - 2 VIEW COMPARISON:  10/08/2018 FINDINGS: Mild cardiac enlargement. Low lung volumes. No vascular congestion or edema. Diffuse interstitial infiltrates in the lungs unchanged since previous studies consistent with chronic interstitial fibrosis. No blunting of costophrenic angles. No pneumothorax. Mediastinal contours appear intact. Calcification of the aorta. Esophageal hiatal hernia behind the heart. Degenerative changes in the spine and shoulders. IMPRESSION: Chronic interstitial fibrosis in the lungs. No evidence of active pulmonary disease. Electronically Signed   By: Lucienne Capers M.D.   On: 11/10/2018 19:10   EKG  Interpretation  Date/Time:  Monday November 10 2018 19:07:53 EST Ventricular Rate:  91 PR Interval:    QRS Duration: 102 QT Interval:  443 QTC Calculation: 546 R Axis:   -24 Text Interpretation:  Sinus rhythm Borderline left axis deviation Nonspecific T abnrm, anterolateral leads Prolonged QT interval Confirmed by Virgel Manifold [phone]) on 11/10/2018 7:28:27 PM Also confirmed  by Virgel Manifold [phone]), editor Philomena Doheny [phone])  on 11/11/2018 7:37:51 AM   Pending Labs Unresulted Labs (From admission, onward)    Start     Ordered   11/12/18 0500  Creatinine, serum  Every 48 hours,   R     11/11/18 1423   11/11/18 1700  Heparin level (unfractionated)  Once-Timed,   R     11/11/18 0943   11/11/18 0500  CBC  Daily,   R     11/10/18 2137   11/10/18 2341  Troponin I - Now Then Q6H  Now then every 6 hours,   R     11/10/18 2340   11/10/18 2238  Urine Culture  Once,   R     11/10/18 2237   11/10/18 1931  Blood Culture ID Panel (Reflexed)  Once,   STAT     11/10/18 1931   Signed and Held  Magnesium  Tomorrow morning,   R    Comments:  Call MD if <1.5    Signed and Held   Signed and Held  Phosphorus  Tomorrow morning,   R     Signed and Held   Signed and Held  TSH  Once,   R    Comments:  Cancel if already done within 1 month and notify MD    Signed and Held   Signed and Held  Comprehensive metabolic panel  Once,   R    Comments:  Cal MD for K<3.5 or >5.0    Signed and Held   Visual merchandiser and Held  CBC  Once,   R    Comments:  Call for hg <8.0    Signed and Held   Signed and Held  Lactic acid, plasma  STAT Now then every 3 hours,   STAT     Signed and Held   Signed and Held  Procalcitonin  ONCE - STAT,   R     Signed and Held   Signed and Held  Culture, sputum-assessment  Once,   R    Question:  Patient immune status  Answer:  Normal   Signed and Held          Vitals/Pain Today's Vitals   11/11/18 1139 11/11/18 1305 11/11/18 1334 11/11/18 1446  BP: (!) 115/98 121/71 99/80  111/70  Pulse: 91 91 87 86  Resp: 20 19 (!) 28 (!) 28  Temp:      SpO2: 97% 92% 95% 92%  Weight:      Height:      PainSc:        Isolation Precautions Droplet precaution  Medications Medications  heparin ADULT infusion 100 units/mL (25000 units/27mL sodium chloride 0.45%) (750 Units/hr Intravenous Rate/Dose Verify 11/11/18 0648)  potassium chloride 10 mEq in 100 mL IVPB (10 mEq Intravenous Not Given 11/11/18 0946)  ALPRAZolam (XANAX) tablet 0.25 mg (has no administration in time range)  furosemide (LASIX) tablet 40 mg (has no administration in time range)  ipratropium-albuterol (DUONEB) 0.5-2.5 (3) MG/3ML nebulizer solution 3 mL (has no administration in time range)  ipratropium-albuterol (DUONEB) 0.5-2.5 (3) MG/3ML nebulizer solution 3 mL (has no administration in time range)  isosorbide mononitrate (IMDUR) 24 hr tablet 30 mg (has no administration in time range)  metoprolol succinate (TOPROL-XL) 24 hr tablet 12.5 mg (has no administration in time range)  nitroGLYCERIN (NITROSTAT) SL tablet 0.4 mg (has no administration in time range)  simvastatin (ZOCOR) tablet 40 mg (has no administration in time range)  sertraline (ZOLOFT)  tablet 50 mg (has no administration in time range)  methylPREDNISolone sodium succinate (SOLU-MEDROL) 40 mg/mL injection 40 mg (has no administration in time range)  sodium chloride 0.9 % bolus 1,000 mL (0 mLs Intravenous Stopped 11/10/18 2034)  sodium chloride 0.9 % bolus 1,000 mL (0 mLs Intravenous Stopped 11/10/18 2358)  ceFEPIme (MAXIPIME) 2 g in sodium chloride 0.9 % 100 mL IVPB (0 g Intravenous Stopped 11/10/18 2125)  vancomycin (VANCOCIN) 1,500 mg in sodium chloride 0.9 % 500 mL IVPB (0 mg Intravenous Stopped 11/10/18 2358)  aspirin chewable tablet 324 mg (324 mg Oral Given 11/10/18 2102)  potassium chloride 10 mEq in 100 mL IVPB (0 mEq Intravenous Stopped 11/11/18 1427)    Mobility walks with device

## 2018-11-11 NOTE — ED Notes (Signed)
Pt provided breakfast tray.

## 2018-11-11 NOTE — Progress Notes (Signed)
PROGRESS NOTE    Linda Kelly  DVV:[id number] DOB: 11-Oct-1928 DOA: 11/10/2018 PCP: Binnie Rail, MD    Brief Narrative:  85 year old female who presented with dyspnea. She does have significant past medical history for severe multivessel coronary artery disease not  for revascularization, peripheral vascular disease, chronic diastolic heart failure, pulmonary fibrosis with proximal respiratory failure, COPD, GERD, depression and history of CVA.  Reported gradual worsening of shortness of breath, refractive to have bronchodilators at home.  She will physical examination blood pressure 114/98, heart rate 93, respiratory rate 28, temperature 97.7, oxygen saturation 98% on supplemental oxygen, she had dry mucous membranes, lungs with wheezing and rales bilaterally, heart S1-S2 present rhythmic, abdomen soft nontender, no lower extremity edema.  Sodium 143, potassium 3.4, chloride 102, bicarb 24, glucose 213, BUN 23, creatinine 1.59, white count 13.4, hemoglobin 13.7, hematocrit 44.1, platelets 232, troponin 2.0.  Urine analysis was negative for infection.  CT of the abdomen pelvis with no acute process.  Chest x-ray with chronic fibrotic changes.  EKG with sinus rhythm, left axis deviation, prolonged QTC.  Patient was admitted to the hospital with working diagnosis of suspected sepsis complicated by type II MI/demand ischemia.   Assessment & Plan:   Active Problems:   Hyperlipidemia   Essential hypertension   CAD S/P percutaneous coronary angioplasty 1985   COPD mixed type (HCC)   GERD   PVD (peripheral vascular disease) (HCC)   NSTEMI myocardial infarction (HCC)   Chronic combined systolic (congestive) and diastolic (congestive) heart failure (HCC)   Pulmonary fibrosis (HCC)   Chronic respiratory failure with hypoxia (HCC)   Demand ischemia (HCC)   Elevated troponin   Severe sepsis (HCC)   Prolonged QT interval   Sepsis ([place])   1. Sepsis. Patient has remained hemodynamically stable, no  source of infection, will hold on further antibiotic therapy for now, will continue to follow cell count, cultures and temperature curve. Continue telemetry monitoring on the medical ward.   2. Pulmonary fibrosis and COPD with acute on chronic hypoxic respiratory failure. Chest film with no new infiltrates that may suggest IPF flare, considering worsening dyspnea and respiratory failure, along with elevated troponin, need to rule out pulmonary embolism. Will check V/Q scan due to elevated serum cr. Will add low dose methylprednisolone and bronchodilator therapy for COPD exacerbation. Will stop heparin drip if VQ scan negative.    3. MI type 2. No clinical signs of acute coronary syndrome, will continue close monitoring of telemetry. No chest pain or significant ekg changes. Patient is known to have advance CAD. Check echocardiogram. Resume isosorbide, metoprolol, simvastatin and as needed nitroglycerin.   4. Diastolic heart failure. Seems to be stable, with no significant volume overload, will continue home regimen of diuretics.   5. Dementia. No agitation, continue neuro checks per unit protocol.   6. AKI, Rneal function with serum cr at 1,59 with K at 3,4 and serum bicarbonate at 24, will resume furosemide to keep negative fluids balance, follow on renal panel in am, avoid hypotension or nephrotoxic medications.   DVT prophylaxis: heparin   Code Status:  full Family Communication:  No family at the bedside  Disposition Plan/ discharge barriers: pending clinical improvement, admit to telemetry.   Message from: Primary Care provider attached.   Dear Dr. Cathlean Kelly,   Linda Kelly is one of my [facility] patients. I see her at home. I was able to get her to complete a MOST form this last month. I see she  has continued to NOT desire aggressive measures. If you would please order Palliative Care/Hospice consult while she is in the hospital, I think that is totally appropriate at this time, if you  also are in agreement.   Thank you for your consideration!   [name]. Myrtie Neither, MSN, GNP-BC  Gerontological Nurse Practitioner  [facility]  [phone]    Body mass index is 29.47 kg/m. Malnutrition Type:      Malnutrition Characteristics:      Nutrition Interventions:     RN Pressure Injury Documentation:     Consultants:     Procedures:     Antimicrobials:       Subjective: Patient continue to have dyspnea, no chest pain, no nausea or vomiting, has poor physical functional capacity.    Objective: Vitals:   11/11/18 1130 11/11/18 1139 11/11/18 1305 11/11/18 1334  BP: (!) 115/98 (!) 115/98 121/71 99/80  Pulse: 93 91 91 87  Resp: (!) 21 20 19  (!) 28  Temp:      SpO2: 98% 97% 92% 95%  Weight:      Height:        Intake/Output Summary (Last 24 hours) at 11/11/2018 1413 Last data filed at 11/11/2018 1252 Gross per 24 hour  Intake 3248.51 ml  Output -  Net 3248.51 ml   Filed Weights   11/10/18 1756  Weight: 64 kg    Examination:   General: deconditioned and ill looking appearing Neurology: Awake and alert, non focal  E ENT: positive pallor, no icterus, oral mucosa moist Cardiovascular: No JVD. S1-S2 present, rhythmic, no gallops, rubs, or murmurs. +/++ nonpitting lower extremity edema. Pulmonary: decreased breath sounds bilaterally, decreased air movement, with no wheezing or rhonchi, scattered rales. Gastrointestinal. Abdomen protuberant with no organomegaly, non tender, no rebound or guarding Skin. No rashes Musculoskeletal: no joint deformities     Data Reviewed: I have personally reviewed following labs and imaging studies  CBC: Recent Labs  Lab 11/10/18 1816 11/11/18 0829  WBC 13.4* 13.3*  NEUTROABS 11.2*  --   HGB 13.7 11.3*  HCT 44.1 36.8  MCV 104.5* 101.9*  PLT 232 694   Basic Metabolic Panel: Recent Labs  Lab 11/10/18 1816  NA 143  K 3.4*  CL 102  CO2 24  GLUCOSE 203*  BUN 23  CREATININE 1.59*    CALCIUM 8.2*   GFR: Estimated Creatinine Clearance: 19.3 mL/min (A) (by C-G formula based on SCr of 1.59 mg/dL (H)). Liver Function Tests: Recent Labs  Lab 11/10/18 1816  AST 31  ALT 17  ALKPHOS 58  BILITOT 1.2  PROT 6.6  ALBUMIN 3.7   No results for input(s): LIPASE, AMYLASE in the last 168 hours. No results for input(s): AMMONIA in the last 168 hours. Coagulation Profile: Recent Labs  Lab 11/10/18 1938  INR 1.09   Cardiac Enzymes: Recent Labs  Lab 11/10/18 2341 11/11/18 0829  TROPONINI 1.67* 1.18*   BNP (last 3 results) Recent Labs    07/23/18 1111  PROBNP 162.0*   HbA1C: No results for input(s): HGBA1C in the last 72 hours. CBG: No results for input(s): GLUCAP in the last 168 hours. Lipid Profile: No results for input(s): CHOL, HDL, LDLCALC, TRIG, CHOLHDL, LDLDIRECT in the last 72 hours. Thyroid Function Tests: No results for input(s): TSH, T4TOTAL, FREET4, T3FREE, THYROIDAB in the last 72 hours. Anemia Panel: No results for input(s): VITAMINB12, FOLATE, FERRITIN, TIBC, IRON, RETICCTPCT in the last 72 hours.    Radiology Studies: I have reviewed all  of the imaging during this hospital visit personally     Scheduled Meds: Continuous Infusions: . heparin 750 Units/hr (11/11/18 0648)  . metronidazole Stopped (11/11/18 1222)     LOS: 1 day        Salomon Ganser Gerome Apley, MD [facility] Pager [phone]

## 2018-11-11 NOTE — ED Notes (Addendum)
Attempted to contact pt's daughter to make her aware of pt's DNR wishes. Unable to leave voicemail.

## 2018-11-12 ENCOUNTER — Inpatient Hospital Stay (HOSPITAL_COMMUNITY): Payer: PPO

## 2018-11-12 ENCOUNTER — Other Ambulatory Visit: Payer: Self-pay | Admitting: *Deleted

## 2018-11-12 ENCOUNTER — Telehealth: Payer: Self-pay | Admitting: Internal Medicine

## 2018-11-12 DIAGNOSIS — R0609 Other forms of dyspnea: Secondary | ICD-10-CM

## 2018-11-12 DIAGNOSIS — I361 Nonrheumatic tricuspid (valve) insufficiency: Secondary | ICD-10-CM

## 2018-11-12 DIAGNOSIS — J9611 Chronic respiratory failure with hypoxia: Secondary | ICD-10-CM

## 2018-11-12 LAB — HEPARIN LEVEL (UNFRACTIONATED)
Heparin Unfractionated: 0.4 IU/mL (ref 0.30–0.70)
Heparin Unfractionated: 0.34 IU/mL (ref 0.30–0.70)

## 2018-11-12 LAB — CBC
HCT: 38.5 % (ref 36.0–46.0)
Hemoglobin: 12 g/dL (ref 12.0–15.0)
MCH: 32.4 pg (ref 26.0–34.0)
MCHC: 31.2 g/dL (ref 30.0–36.0)
MCV: 104.1 fL — ABNORMAL HIGH (ref 80.0–100.0)
Platelets: 239 10*3/uL (ref 150–400)
RBC: 3.7 MIL/uL — ABNORMAL LOW (ref 3.87–5.11)
RDW: 15.3 % (ref 11.5–15.5)
WBC: 28.4 10*3/uL — ABNORMAL HIGH (ref 4.0–10.5)
nRBC: 0 % (ref 0.0–0.2)

## 2018-11-12 LAB — URINE CULTURE: Culture: <10000 — AB

## 2018-11-12 LAB — CREATININE, SERUM
Creatinine, Ser: 1.02 mg/dL — ABNORMAL HIGH (ref 0.44–1.00)
GFR calc Af Amer: 57 mL/min — ABNORMAL LOW (ref >60)
GFR calc non Af Amer: 49 mL/min — ABNORMAL LOW (ref >60)

## 2018-11-12 LAB — ECHOCARDIOGRAM COMPLETE
HEIGHTINCHES: 59 in
WEIGHTICAEL: 2409.6 [oz_av]

## 2018-11-12 MED ORDER — MORPHINE SULFATE (CONCENTRATE) 10 MG/0.5ML PO SOLN: 5.0000 mg | ORAL | Status: DC | PRN

## 2018-11-12 MED ORDER — ENSURE ENLIVE PO LIQD
237.0000 mL | Freq: Two times a day (BID) | ORAL | Status: DC
  Administered 2018-11-14 – 2018-11-18 (×8): 237 mL via ORAL

## 2018-11-12 MED ORDER — SODIUM CHLORIDE 0.9 % IV BOLUS
500.0000 mL | Freq: Once | INTRAVENOUS | Status: AC
  Administered 2018-11-12: 500 mL via INTRAVENOUS

## 2018-11-12 NOTE — Progress Notes (Signed)
PROGRESS NOTE    Linda Kelly  MOQ:[id number] DOB: 1928-07-22 DOA: 11/10/2018 PCP: Binnie Rail, MD    Brief Narrative:  84 year old female who presented with dyspnea. She does have significant past medical history for severe multivessel coronary artery disease not  for revascularization, peripheral vascular disease, chronic diastolic heart failure, pulmonary fibrosis with proximal respiratory failure, COPD, GERD, depression and history of CVA.  Reported gradual worsening of shortness of breath, refractive to have bronchodilators at home.  She will physical examination blood pressure 114/98, heart rate 93, respiratory rate 28, temperature 97.7, oxygen saturation 98% on supplemental oxygen, she had dry mucous membranes, lungs with wheezing and rales bilaterally, heart S1-S2 present rhythmic, abdomen soft nontender, no lower extremity edema.  Sodium 143, potassium 3.4, chloride 102, bicarb 24, glucose 213, BUN 23, creatinine 1.59, white count 13.4, hemoglobin 13.7, hematocrit 44.1, platelets 232, troponin 2.0.  Urine analysis was negative for infection.  CT of the abdomen pelvis with no acute process.  Chest x-ray with chronic fibrotic changes.  EKG with sinus rhythm, left axis deviation, prolonged QTC.  Patient was admitted to the hospital with working diagnosis of suspected sepsis complicated by type II MI/demand ischemia.    Assessment & Plan:   Active Problems:   Hyperlipidemia   Essential hypertension   CAD S/P percutaneous coronary angioplasty 1985   COPD mixed type (HCC)   GERD   Dyspnea   PVD (peripheral vascular disease) (HCC)   NSTEMI myocardial infarction (HCC)   Chronic combined systolic (congestive) and diastolic (congestive) heart failure (HCC)   Pulmonary fibrosis (HCC)   Chronic respiratory failure with hypoxia (HCC)   Demand ischemia (HCC)   Elevated troponin   Severe sepsis (HCC)   Prolonged QT interval   Sepsis ([place])   Respiratory failure ([place])   1. Sepsis  with unclear source of infection. Patient has remained hemodynamically stable, no source of infection, abx on hold for now, will continue to follow cell count, cultures and temperature curve. Continue telemetry monitoring on the medical ward.   2. Pulmonary fibrosis and COPD with acute on chronic hypoxic respiratory failure. Chest film with no new infiltrates that may suggest IPF flare. Pt is continued on low dose methylprednisolone and bronchodilator therapy for underlying COPD exacerbation.  Initial question of PE, thus VQ was ordered, however pt is unable to perform study given O2 requirements. Heparin drip was started empirically    3. MI type 2. No clinical signs of acute coronary syndrome, will continue close monitoring of telemetry. No chest pain or significant ekg changes. Patient is known to have advance CAD. Check echocardiogram. Continued on isosorbide, metoprolol, simvastatin and as needed nitroglycerin.   4. Diastolic heart failure. Seems to be stable. Recent CXR reviewed personally. Findings suggestive of edema, however pt's bp did not tolerate lasix, noted to be hypotensive this AM, requiring IVF bolus.   5. Dementia. No agitation, continue neuro checks per unit protocol.   6. AKI, Rneal function with serum cr at 1,59 with K at 3,4 and serum bicarbonate at 24. Pt is continued on PO lasix, bp hypotensive overnight after receiving 60mg  IV lasix, improved with IVF bolus   DVT prophylaxis: Heparin gtt Code Status: DNR Family Communication: Pt in room Disposition Plan: Uncertain at this time  Consultants:   CCM  Palliative Care  Procedures:     Antimicrobials: Anti-infectives (From admission, onward)   Start     Dose/Rate Route Frequency Ordered Stop   11/12/18 1800  vancomycin (VANCOCIN) IVPB  750 mg/150 ml premix  Status:  Discontinued     750 mg 150 mL/hr over 60 Minutes Intravenous Every 48 hours 11/11/18 1423 11/11/18 1543   11/11/18 2200  ceFEPIme (MAXIPIME) 1 g  in sodium chloride 0.9 % 100 mL IVPB  Status:  Discontinued     1 g 200 mL/hr over 30 Minutes Intravenous Every 24 hours 11/11/18 1423 11/11/18 1543   11/10/18 1945  metroNIDAZOLE (FLAGYL) IVPB 500 mg  Status:  Discontinued     500 mg 100 mL/hr over 60 Minutes Intravenous Every 8 hours 11/10/18 1939 11/11/18 1543   11/10/18 1945  ceFEPIme (MAXIPIME) 2 g in sodium chloride 0.9 % 100 mL IVPB     2 g 200 mL/hr over 30 Minutes Intravenous  Once 11/10/18 1943 11/10/18 2125   11/10/18 1945  vancomycin (VANCOCIN) 1,500 mg in sodium chloride 0.9 % 500 mL IVPB     1,500 mg 250 mL/hr over 120 Minutes Intravenous  Once 11/10/18 1943 11/10/18 2358       Subjective: Complaining of worse sob this AM  Objective: Vitals:   11/12/18 0907 11/12/18 1307 11/12/18 1453 11/12/18 1500  BP: (!) 82/61 100/65    Pulse: 89 (!) 101    Resp:  16    Temp: (!) 97.5 F (36.4 C) (!) 97.5 F (36.4 C)    TempSrc: Axillary Oral    SpO2: 99%  98% 94%  Weight:      Height:        Intake/Output Summary (Last 24 hours) at 11/12/2018 1713 Last data filed at 11/12/2018 0600 Gross per 24 hour  Intake 1146.52 ml  Output 2500 ml  Net -1353.48 ml   Filed Weights   11/10/18 1756 11/11/18 1700  Weight: 64 kg 68.3 kg    Examination:  General exam: Appears calm and comfortable  Respiratory system: Increased resp effort, clear Cardiovascular system: S1 & S2 heard, RRR Gastrointestinal system: Abdomen is nondistended, soft and nontender. No organomegaly or masses felt. Normal bowel sounds heard. Central nervous system: Alert and oriented. No focal neurological deficits. Extremities: Symmetric 5 x 5 power. Skin: No rashes, lesions Psychiatry: Judgement and insight appear normal. Mood & affect appropriate.   Data Reviewed: I have personally reviewed following labs and imaging studies  CBC: Recent Labs  Lab 11/10/18 1816 11/11/18 0829 11/11/18 1731 11/12/18 0225  WBC 13.4* 13.3* 23.8* 28.4*  NEUTROABS  11.2*  --   --   --   HGB 13.7 11.3* 11.3* 12.0  HCT 44.1 36.8 35.9* 38.5  MCV 104.5* 101.9* 103.5* 104.1*  PLT 232 187 185 161   Basic Metabolic Panel: Recent Labs  Lab 11/10/18 1816 11/11/18 1731 11/12/18 0225  NA 143 142  --   K 3.4* 4.4  --   CL 102 113*  --   CO2 24 22  --   GLUCOSE 203* 107*  --   BUN 23 25*  --   CREATININE 1.59* 1.05* 1.02*  CALCIUM 8.2* 7.7*  --   MG  --  1.9  --   PHOS  --  3.4  --    GFR: Estimated Creatinine Clearance: 32 mL/min (A) (by C-G formula based on SCr of 1.02 mg/dL (H)). Liver Function Tests: Recent Labs  Lab 11/10/18 1816 11/11/18 1731  AST 31 25  ALT 17 16  ALKPHOS 58 48  BILITOT 1.2 0.7  PROT 6.6 5.7*  ALBUMIN 3.7 3.0*   No results for input(s): LIPASE, AMYLASE in the last 168 hours.  No results for input(s): AMMONIA in the last 168 hours. Coagulation Profile: Recent Labs  Lab 11/10/18 1938  INR 1.09   Cardiac Enzymes: Recent Labs  Lab 11/10/18 2341 11/11/18 0829 11/11/18 1731  TROPONINI 1.67* 1.18* 0.88*   BNP (last 3 results) Recent Labs    07/23/18 1111  PROBNP 162.0*   HbA1C: No results for input(s): HGBA1C in the last 72 hours. CBG: No results for input(s): GLUCAP in the last 168 hours. Lipid Profile: No results for input(s): CHOL, HDL, LDLCALC, TRIG, CHOLHDL, LDLDIRECT in the last 72 hours. Thyroid Function Tests: Recent Labs    11/11/18 1731  TSH 0.345*   Anemia Panel: No results for input(s): VITAMINB12, FOLATE, FERRITIN, TIBC, IRON, RETICCTPCT in the last 72 hours. Sepsis Labs: Recent Labs  Lab 11/10/18 1932 11/10/18 2121 11/11/18 1731 11/11/18 1950  PROCALCITON  --   --  <0.10  --   LATICACIDVEN 6.42* 7.25* 2.7* 3.2*    Recent Results (from the past 240 hour(s))  Culture, blood (routine x 2)     Status: None (Preliminary result)   Collection Time: 11/10/18  7:26 PM  Result Value Ref Range Status   Specimen Description   Final    RIGHT ANTECUBITAL Performed at [facility], [place] [address] [place], [place] [address]    Special Requests   Final    BOTTLES DRAWN AEROBIC AND ANAEROBIC Blood Culture adequate volume Performed at [facility] [address], [place], [place] [address]    Culture   Final    NO GROWTH 2 DAYS Performed at [facility] [address] [place], [place] [address]    Report Status PENDING  Incomplete  Culture, blood (routine x 2)     Status: Abnormal (Preliminary result)   Collection Time: 11/10/18  7:31 PM  Result Value Ref Range Status   Specimen Description   Final    LEFT ANTECUBITAL Performed at [facility] [address], [place], [place] [address]    Special Requests   Final    BOTTLES DRAWN AEROBIC AND ANAEROBIC Blood Culture adequate volume Performed at [facility] [address], [place], [place] [address]    Culture  Setup Time   Final    GRAM POSITIVE COCCI IN CHAINS ANAEROBIC BOTTLE ONLY CRITICAL RESULT CALLED TO, READ BACK BY AND VERIFIED WITH: Elenore Paddy PharmD 16:50 11/11/18 ([email or web address])    Culture (A)  Final    VIRIDANS STREPTOCOCCUS THE SIGNIFICANCE OF ISOLATING THIS ORGANISM FROM A SINGLE SET OF BLOOD CULTURES WHEN MULTIPLE SETS ARE DRAWN IS UNCERTAIN. PLEASE NOTIFY THE MICROBIOLOGY DEPARTMENT WITHIN ONE WEEK IF SPECIATION AND SENSITIVITIES ARE REQUIRED. Performed at [facility], [place] [address] [place],  [address]    Report Status PENDING  Incomplete  Blood Culture ID Panel (Reflexed)     Status: Abnormal   Collection Time: 11/10/18  7:31 PM  Result Value Ref Range Status   Enterococcus species NOT DETECTED NOT DETECTED Final   Listeria monocytogenes NOT DETECTED NOT DETECTED Final   Staphylococcus species NOT DETECTED NOT DETECTED Final   Staphylococcus aureus (BCID) NOT DETECTED NOT DETECTED Final   Streptococcus species DETECTED (A) NOT DETECTED Final    Comment: Not Enterococcus species,  Streptococcus agalactiae, Streptococcus pyogenes, or Streptococcus pneumoniae. CRITICAL RESULT CALLED TO, READ BACK BY AND VERIFIED WITH: Elenore Paddy PharmD 16:50 11/11/18 ([email or web address])    Streptococcus agalactiae NOT DETECTED NOT DETECTED Final   Streptococcus pneumoniae  NOT DETECTED NOT DETECTED Final   Streptococcus pyogenes NOT DETECTED NOT DETECTED Final   Acinetobacter baumannii NOT DETECTED NOT DETECTED Final   Enterobacteriaceae species NOT DETECTED NOT DETECTED Final   Enterobacter cloacae complex NOT DETECTED NOT DETECTED Final   Escherichia coli NOT DETECTED NOT DETECTED Final   Klebsiella oxytoca NOT DETECTED NOT DETECTED Final   Klebsiella pneumoniae NOT DETECTED NOT DETECTED Final   Proteus species NOT DETECTED NOT DETECTED Final   Serratia marcescens NOT DETECTED NOT DETECTED Final   Haemophilus influenzae NOT DETECTED NOT DETECTED Final   Neisseria meningitidis NOT DETECTED NOT DETECTED Final   Pseudomonas aeruginosa NOT DETECTED NOT DETECTED Final   Candida albicans NOT DETECTED NOT DETECTED Final   Candida glabrata NOT DETECTED NOT DETECTED Final   Candida krusei NOT DETECTED NOT DETECTED Final   Candida parapsilosis NOT DETECTED NOT DETECTED Final   Candida tropicalis NOT DETECTED NOT DETECTED Final    Comment: Performed at [facility], [place] [address] [place], [place] [address]  Urine Culture     Status: Abnormal   Collection Time: 11/10/18 10:38 PM  Result Value Ref Range Status   Specimen Description   Final    URINE, CLEAN CATCH Performed at [facility], [place] [address] [place], [place] [address]    Special Requests   Final    NONE Performed at [facility], [place] [address] [place], [place] [address]    Culture (A)  Final    <10,000 COLONIES/mL INSIGNIFICANT GROWTH Performed at [facility] [address] [place], [place] [address]    Report Status 11/12/2018 FINAL  Final  Respiratory Panel by PCR      Status: None   Collection Time: 11/10/18 11:15 PM  Result Value Ref Range Status   Adenovirus NOT DETECTED NOT DETECTED Final   Coronavirus 229E NOT DETECTED NOT DETECTED Final   Coronavirus HKU1 NOT DETECTED NOT DETECTED Final   Coronavirus NL63 NOT DETECTED NOT DETECTED Final   Coronavirus OC43 NOT DETECTED NOT DETECTED Final   Metapneumovirus NOT DETECTED NOT DETECTED Final   Rhinovirus / Enterovirus NOT DETECTED NOT DETECTED Final   Influenza A NOT DETECTED NOT DETECTED Final   Influenza B NOT DETECTED NOT DETECTED Final   Parainfluenza Virus 1 NOT DETECTED NOT DETECTED Final   Parainfluenza Virus 2 NOT DETECTED NOT DETECTED Final   Parainfluenza Virus 3 NOT DETECTED NOT DETECTED Final   Parainfluenza Virus 4 NOT DETECTED NOT DETECTED Final   Respiratory Syncytial Virus NOT DETECTED NOT DETECTED Final   Bordetella pertussis NOT DETECTED NOT DETECTED Final   Chlamydophila pneumoniae NOT DETECTED NOT DETECTED Final   Mycoplasma pneumoniae NOT DETECTED NOT DETECTED Final    Comment: Performed at [facility], [place] [address] [place], [place] [address]     Radiology Studies: Ct Abdomen Pelvis Wo Contrast  Result Date: 11/10/2018 CLINICAL DATA:  Shortness of breath and generalized abdominal discomfort. History of COPD. EXAM: CT ABDOMEN AND PELVIS WITHOUT CONTRAST TECHNIQUE: Multidetector CT imaging of the abdomen and pelvis was performed following the standard protocol without IV contrast. COMPARISON:  10/02/2018 FINDINGS: Lower chest: Lung bases demonstrate chronic interstitial changes consistent with usual interstitial pneumonitis. There is interstitial fibrosis, mostly peripheral. Bronchiectasis and honeycomb changes. No superimposed consolidation visualized. Similar appearance to previous study. Hepatobiliary: No focal liver abnormality is seen. No gallstones, gallbladder wall thickening, or biliary dilatation. Pancreas: Unremarkable. No pancreatic ductal dilatation or surrounding  inflammatory changes. Spleen:  Normal in size without focal abnormality. Adrenals/Urinary Tract: No adrenal gland nodules. Kidneys are symmetrical. No hydronephrosis or hydroureter. No renal, ureteral, or bladder stones. No bladder wall thickening. Stomach/Bowel: Stomach, small bowel, and colon are not abnormally distended. There is a prominent diverticulum on the second portion of the duodenum. No wall thickening or inflammatory changes in the bowel. Diverticulosis of the sigmoid colon without evidence of diverticulitis. Appendix is normal. Vascular/Lymphatic: Aortic atherosclerosis. No enlarged abdominal or pelvic lymph nodes. Reproductive: Uterus and bilateral adnexa are unremarkable. Other: No abdominal wall hernia or abnormality. No abdominopelvic ascites. Musculoskeletal: Degenerative changes in the spine and hips. IMPRESSION: No acute process demonstrated in the abdomen or pelvis. No evidence of bowel obstruction or inflammation. Diverticulosis of the colon without evidence of diverticulitis. Chronic interstitial changes in the lung bases. Aortic Atherosclerosis (ICD10-I70.0). Electronically Signed   By: Lucienne Capers M.D.   On: 11/10/2018 22:55   Dg Chest 2 View  Result Date: 11/10/2018 CLINICAL DATA:  Shortness of breath over 1 day. History of COPD. Home oxygen. EXAM: CHEST - 2 VIEW COMPARISON:  10/08/2018 FINDINGS: Mild cardiac enlargement. Low lung volumes. No vascular congestion or edema. Diffuse interstitial infiltrates in the lungs unchanged since previous studies consistent with chronic interstitial fibrosis. No blunting of costophrenic angles. No pneumothorax. Mediastinal contours appear intact. Calcification of the aorta. Esophageal hiatal hernia behind the heart. Degenerative changes in the spine and shoulders. IMPRESSION: Chronic interstitial fibrosis in the lungs. No evidence of active pulmonary disease. Electronically Signed   By: Lucienne Capers M.D.   On: 11/10/2018 19:10   Dg Chest  Port 1 View  Result Date: 11/11/2018 CLINICAL DATA:  85 year old female with shortness of breath. EXAM: PORTABLE CHEST 1 VIEW COMPARISON:  11/10/2018 and earlier. FINDINGS: Portable AP semi upright view at 2010 hours. Diffuse bilateral increased pulmonary interstitial opacity with basilar predominance. Stable lung volumes and mediastinal contours. Visualized tracheal air column is within normal limits. No pneumothorax. No definite pleural effusion. No consolidation identified. IMPRESSION: Acute pulmonary edema suspected, with main differential consideration of acute viral/atypical respiratory infection. Electronically Signed   By: Genevie Ann M.D.   On: 11/11/2018 20:53    Scheduled Meds: . aspirin EC  81 mg Oral QHS  . ezetimibe  10 mg Oral Daily  . feeding supplement (ENSURE ENLIVE)  237 mL Oral BID BM  . furosemide  40 mg Oral BID  . ipratropium-albuterol  3 mL Nebulization Q6H  . isosorbide mononitrate  30 mg Oral Daily  . methylPREDNISolone (SOLU-MEDROL) injection  40 mg Intravenous Q24H  . metoprolol succinate  12.5 mg Oral Daily  . mometasone-formoterol  1 puff Inhalation BID  . sertraline  50 mg Oral Daily  . simvastatin  40 mg Oral q1800   Continuous Infusions: . heparin 850 Units/hr (11/12/18 0600)     LOS: 2 days   Marylu Lund, MD [facility] Pager On Amion  If 7PM-7AM, please contact night-coverage 11/12/2018, 5:13 PM

## 2018-11-12 NOTE — Telephone Encounter (Signed)
Pt's daughter, [name], is calling back. Cb is [phone]

## 2018-11-12 NOTE — Progress Notes (Signed)
Pt on partial nonrebreather this morning dropped in BP to 82/61 after 500cc bolus. MD notified via text/page and another 500cc bolus ordered. BP stabilized and pt not symptomatic. No other complaints at this time. Will continue to monitor.

## 2018-11-12 NOTE — Consult Note (Addendum)
NAME:  Linda Kelly, MRN:  [id number], DOB:  [birth year], LOS: 2 ADMISSION DATE:  11/10/2018, CONSULTATION DATE:  11/12/18 REFERRING MD:  Dr. Wyline Copas, CHIEF COMPLAINT:  SOB, hypoxia    Brief History   85 y/o F with UIP / ILD, 5L O2 dependent COPD admitted 1/13 with reports of weakness, SOB.  Found to have worsening hypoxia.  PCCM consulted for evaluation of hypoxic respiratory failure.  History of present illness   85 y/o F with UIP / ILD, 5L O2 dependent COPD admitted 1/13 with reports of weakness and SOB.  The patient was recently admitted to the hospital for UTI in 12/5-12/14//2019. She was discharged on a prolonged steroid taper. She followed up in the Pulmonary office on 12/20 with reports of desaturations to the mid 80's with exertion.  She reported at that time that she quickly recovered with rest.  Weight at that time was 137 lbs with clothing/shoes.  She was evaluated for pulsed O2 and felt to need continuous flow and was given an oxymizer for her O2.  Additionally, she was referred to Pulmonary Rehab.    The patient is a poor historian.  Her daughter is at bedside.  Patient reports she is mostly sedentary.  She is able to walk to the restroom, does not cook for herself, requires assistance with bathing but is able to manage her bills online.  She states she never swells in her feet or hands but notes her abdomen to bloat.  She reports she was so weak prior to admit that she had to lay in bed for several hours. She was scared and felt SOB.  Denies known fevers, chills, n/v/d.  She was unable to get up and urinated in her bed multiple times.  Initial hospital evaluation was concerning for possible sepsis with hypotension, elevated lactic acid to 7.2 and she was volume resuscitated.  She was admitted per [facility] for further evaluation.  RVP was negative, cultures to date are negative.  Antibiotics were stopped and pt observed.   She had an episode overnight 1/15 where she woke and became SOB and afraid.   She states her O2 needs went up during that episode.  She was given lasix with 2500 ml UOP.  Later in the day, BP dropped and she required a fluid bolus.      Past Medical History  Former Tobacco Abuse - quit 2015, 60 pk years O2 Dependent COPD UIP/ILD  Anxiety / depression  CAD  CHF  GERD Hiatal Hernia   Significant Hospital Events   1/13  Admit   Consults:  PCCM 1/15  Procedures:    Significant Diagnostic Tests:  CT chest 01/04/15- the pattern is most compatible with usual interstitial pneumonia (UIP) CT chest 11/28/2016- Continued progression of fibrotic interstitial lung disease CXR 05/15/2018- Cardiomegaly.Chronic interstitial lung disease. Slight increase in interstitial prominence suggesting the possibility of the presence of a mild component of CHF or pneumonitis  ECHO 1/15 >> LVEF 55-60%, poor windows, grade 1 diastolic dysfunction, mild to mod MR, pa peak pressure 42  Micro Data:  RVP 1/13 >> negative   Antimicrobials:  Cefepime 1/13 >> 1/13 Vancomycin 1/13 >> 1/13  Interim history/subjective:  As above   Objective   Blood pressure 100/65, pulse (!) 101, temperature (!) 97.5 F (36.4 C), temperature source Oral, resp. rate 16, height 4\' 11"  (1.499 m), weight 68.3 kg, SpO2 98 %.        Intake/Output Summary (Last 24 hours) at 11/12/2018 1459 Last data  filed at 11/12/2018 0600 Gross per 24 hour  Intake 1146.52 ml  Output 2500 ml  Net -1353.48 ml   Filed Weights   11/10/18 1756 11/11/18 1700  Weight: 64 kg 68.3 kg    Examination: General: frail elderly female lying in bed in NAD HEENT: MM pink/moist, dentures in place Neuro: Awake, alert, MAE, oriented CV: s1s2 rrr, no m/r/g PULM: even/non-labored, lungs bilaterally with bilateral crackles 2/3 way up PZ:WCHE, non-tender, bsx4 active  Extremities: warm/dry, trace to 1+ pre-tibial pitting edema  Skin: no rashes or lesions  Resolved Hospital Problem list      Assessment & Plan:   Acute on  Chronic Hypoxic Respiratory Failure  -baseline 4-5L O2 dependent  -likely multifactorial in the setting of COPD/emphysema, baseline hypoxia, ILD, immobility/kyphosis with atelectasis (noted on exam that O2 increased with deep breath) P: Wean O2 for sats > 88-95% PT efforts / mobilize  Follow intermittent CXR  Would like to diurese if BP / Sr Cr will tolerate   ILD/UIP  -[place] dust exposure  -slow progression of ILD, pt opted not to treat, followed by Dr. Annamaria Boots P: CT abd (lower lungs only) compared to prior imaging, doubt ILD progression causing O2 need increase.  Concern for possible contributing volume/effusions (note increase in weight) CXR imaging was essentially clear on prior films, most recent here appears to have effusions  Continue solumedrol, wean to prednisone (may be contributing to fluid retention) and wean to baseline of 5mg  as tolerated.  She may need to discharge on higher dose and follow up with pulmonary.   Consider palliative care at home for symptom management (primary has requested)  MD to follow / review.    Best practice:  Diet: As tolerated  Pain/Anxiety/Delirium protocol (if indicated): n/a VAP protocol (if indicated): n/a DVT prophylaxis: per primary  GI prophylaxis: n/a Glucose control: per primary  Mobility: as tolerated / OOB  Code Status: DNR  Family Communication: patient and daughter updated on plan of care Disposition: TRH/floor  Labs   CBC: Recent Labs  Lab 11/10/18 1816 11/11/18 0829 11/11/18 1731 11/12/18 0225  WBC 13.4* 13.3* 23.8* 28.4*  NEUTROABS 11.2*  --   --   --   HGB 13.7 11.3* 11.3* 12.0  HCT 44.1 36.8 35.9* 38.5  MCV 104.5* 101.9* 103.5* 104.1*  PLT 232 187 185 527    Basic Metabolic Panel: Recent Labs  Lab 11/10/18 1816 11/11/18 1731 11/12/18 0225  NA 143 142  --   K 3.4* 4.4  --   CL 102 113*  --   CO2 24 22  --   GLUCOSE 203* 107*  --   BUN 23 25*  --   CREATININE 1.59* 1.05* 1.02*  CALCIUM 8.2*  7.7*  --   MG  --  1.9  --   PHOS  --  3.4  --    GFR: Estimated Creatinine Clearance: 32 mL/min (A) (by C-G formula based on SCr of 1.02 mg/dL (H)). Recent Labs  Lab 11/10/18 1816 11/10/18 1932 11/10/18 2121 11/11/18 0829 11/11/18 1731 11/11/18 1950 11/12/18 0225  PROCALCITON  --   --   --   --  <0.10  --   --   WBC 13.4*  --   --  13.3* 23.8*  --  28.4*  LATICACIDVEN  --  6.42* 7.25*  --  2.7* 3.2*  --     Liver Function Tests: Recent Labs  Lab 11/10/18 1816 11/11/18 1731  AST 31 25  ALT  17 16  ALKPHOS 58 48  BILITOT 1.2 0.7  PROT 6.6 5.7*  ALBUMIN 3.7 3.0*   No results for input(s): LIPASE, AMYLASE in the last 168 hours. No results for input(s): AMMONIA in the last 168 hours.  ABG    Component Value Date/Time   PHART 7.348 (L) 10/02/2018 2215   PCO2ART 34.5 10/02/2018 2215   PO2ART 57.0 (L) 10/02/2018 2215   HCO3 19.0 (L) 10/02/2018 2215   TCO2 20 (L) 10/02/2018 2215   ACIDBASEDEF 6.0 (H) 10/02/2018 2215   O2SAT 89.0 10/02/2018 2215     Coagulation Profile: Recent Labs  Lab 11/10/18 1938  INR 1.09    Cardiac Enzymes: Recent Labs  Lab 11/10/18 2341 11/11/18 0829 11/11/18 1731  TROPONINI 1.67* 1.18* 0.88*    HbA1C: Hgb A1c MFr Bld  Date/Time Value Ref Range Status  08/09/2016 01:40 AM 5.2 4.8 - 5.6 % Final    Comment:    (NOTE)         Pre-diabetes: 5.7 - 6.4         Diabetes: >6.4         Glycemic control for adults with diabetes: <7.0   02/28/2016 10:46 AM 5.4 4.6 - 6.5 % Final    Comment:    Glycemic Control Guidelines for People with Diabetes:Non Diabetic:  <6%Goal of Therapy: <7%Additional Action Suggested:  >8%     CBG: No results for input(s): GLUCAP in the last 168 hours.  Review of Systems: Positives in [place]   Gen: Denies fever, chills, weight change, fatigue, night sweats, weakness HEENT: Denies blurred vision, double vision, hearing loss, tinnitus, sinus congestion, rhinorrhea, sore throat, neck stiffness, dysphagia PULM:  Denies shortness of breath, cough, sputum production, hemoptysis, wheezing CV: Denies chest pain, edema, orthopnea, paroxysmal nocturnal dyspnea, palpitations GI: Denies abdominal pain, nausea, vomiting, diarrhea, hematochezia, melena, constipation, change in bowel habits GU: Denies dysuria, hematuria, polyuria, oliguria, urethral discharge Endocrine: Denies hot or cold intolerance, polyuria, polyphagia or appetite change Derm: Denies rash, dry skin, scaling or peeling skin change Heme: Denies easy bruising, bleeding, bleeding gums Neuro: Denies headache, numbness, weakness, slurred speech, loss of memory or consciousness  Past Medical History  She,  has a past medical history of Anxiety, Anxiety and depression, Arthritis, Benign neoplasm of esophagus, CAD (coronary artery disease), Cancer ([place]), Carotid artery disease ([place]), CHF (congestive heart failure) ([place]), Chronic gastritis, Colon, diverticulosis, COPD (chronic obstructive pulmonary disease) ([place]), Depression, GERD (gastroesophageal reflux disease), Hiatal hernia, High frequency hearing loss, Hyperlipidemia, IBS (irritable bowel syndrome), Idiopathic pulmonary fibrosis, Internal hemorrhoid, MI (myocardial infarction) ([place]) (1985), Other nonthrombocytopenic purpuras, PONV (postoperative nausea and vomiting), PVD (peripheral vascular disease) ([place]), Shortness of breath, Solitary pulmonary nodule, Stroke ([place]), Tinnitus, and Vertigo.   Surgical History    Past Surgical History:  Procedure Laterality Date  . ANGIOPLASTY  1997, 98, 99  . BREAST SURGERY     Hx; of biopsy  . CARDIAC CATHETERIZATION N/A 08/10/2016   Procedure: Left Heart Cath and Coronary Angiography;  Surgeon: Belva Crome, MD;  Location: [facility];  Service: Cardiovascular;  Laterality: N/A;  . CAROTID ENDARTERECTOMY     left side x 3, saphenous vein graft from left leg  . CATARACT EXTRACTION W/ INTRAOCULAR LENS  IMPLANT, BILATERAL    . COLON SURGERY    .  COLONOSCOPY  11-27-01, 10-30-05, 05-24-11   diverticulosis, hemorrhoids  . DENTAL SURGERY     2012   . ENDARTERECTOMY Right 03/03/2014   Procedure: RIGHT CAROTID ENDARTERECTOMY  WITH PATCH ANGIOPLASTY;  Surgeon: Rosetta Posner, MD;  Location: [facility];  Service: Vascular;  Laterality: Right;  . FOOT SURGERY     bilateral  . SHOULDER SURGERY     left  . TOE SURGERY     right foot  . TONSILLECTOMY    . TUBAL LIGATION    . UPPER GASTROINTESTINAL ENDOSCOPY  11-27-01, 08-18-08   Hiatal hernia, benign esophagus neoplasia, gastritis, tortuous esophagus 34 Maloney dilation performed     Social History   reports that she quit smoking about 4 years ago. Her smoking use included cigarettes. She has a 30.00 pack-year smoking history. She has never used smokeless tobacco. She reports that she does not drink alcohol or use drugs.   Family History   Her family history includes Breast cancer in her maternal aunt; Cancer in her sister; Colitis in her mother and sister; Colon cancer in her father; Heart attack in her father and son; Heart disease in her father and mother; Hyperlipidemia in her brother, daughter, father, mother, sister, sister, and son; Hypertension in her daughter and son; Lung cancer in her paternal aunt; Other in her brother; Prostate cancer in her father; Stroke in her mother; Vasculitis in her son. There is no history of COPD or Asthma.   Allergies No Known Allergies   Home Medications  Prior to Admission medications   Medication Sig Start Date End Date Taking? Authorizing Provider  acetaminophen (TYLENOL) 650 MG CR tablet Take 650 mg by mouth daily.   Yes [provider]  ALPRAZolam (XANAX) 0.25 MG tablet Take 1 tablet (0.25 mg total) by mouth 3 (three) times daily as needed for anxiety. 10/15/18  Yes Binnie Rail, MD  aspirin EC 81 MG tablet Take 81 mg by mouth at bedtime.    Yes [provider]  cholecalciferol (VITAMIN D) 1000 UNITS tablet Take 1,000 Units by mouth at  bedtime.    Yes [provider]  ezetimibe (ZETIA) 10 MG tablet Take 1 tablet (10 mg total) by mouth daily. 08/07/18 11/11/18 Yes Lelon Perla, MD  famotidine (PEPCID) 20 MG tablet Take 1 tablet (20 mg total) by mouth 2 (two) times daily. 09/18/18  Yes Burns, Claudina Lick, MD  furosemide (LASIX) 40 MG tablet TAKE 1 TABLET BY MOUTH TWICE A DAY Patient taking differently: Take 40 mg by mouth 2 (two) times daily.  10/07/18  Yes Lelon Perla, MD  ipratropium-albuterol (DUONEB) 0.5-2.5 (3) MG/3ML SOLN Take 3 mLs by nebulization every 4 (four) hours as needed. Patient taking differently: Take 3 mLs by nebulization every 4 (four) hours as needed (shortness of breath).  09/24/18  Yes Young, Tarri Fuller D, MD  isosorbide mononitrate (IMDUR) 30 MG 24 hr tablet Take 1 tablet (30 mg total) by mouth daily. 04/25/18 11/11/18 Yes Lelon Perla, MD  metoprolol succinate (TOPROL-XL) 25 MG 24 hr tablet Take 0.5 tablets (12.5 mg total) by mouth daily. 10/13/18  Yes Lelon Perla, MD  nitroGLYCERIN (NITROSTAT) 0.4 MG SL tablet DISSOLVE 1 TABLET UNDER THE TONGUE EVERY 5 MINUTES FOR 3 DOSES AS NEEDED FOR CHEST PAIN. Patient taking differently: Place 0.4 mg under the tongue every 5 (five) minutes as needed for chest pain.  09/22/18  Yes Burns, Claudina Lick, MD  Omega-3 Fatty Acids (FISH OIL) 1000 MG CAPS Take 1,000 mg by mouth at bedtime.    Yes [provider]  potassium chloride (K-DUR) 10 MEQ tablet TAKE 1/2 TABLET ONCE DAILY Patient taking differently: Take 5 mEq  by mouth daily.  09/22/18  Yes Lelon Perla, MD  predniSONE (DELTASONE) 5 MG tablet Take 1 tablet (5 mg total) by mouth daily with breakfast. After prednisone taper completed, start 5mg  daily x 3 weeks 10/16/18  Yes Martyn Ehrich, NP  Propyl Glycol-Hydroxyethylcell (NASAL MOIST) GEL Place 1 application into the nose daily as needed (nasal congestion).   Yes [provider]  sertraline (ZOLOFT) 50 MG tablet TAKE 1 TABLET BY  MOUTH EVERY DAY Patient taking differently: Take 50 mg by mouth daily.  08/06/18  Yes Burns, Claudina Lick, MD  simvastatin (ZOCOR) 40 MG tablet TAKE 1 TABLET BY MOUTH EVERY DAY Patient taking differently: Take 40 mg by mouth daily at 6 PM.  07/08/18  Yes Burns, Claudina Lick, MD  vitamin E 400 UNIT capsule Take 400 Units by mouth at bedtime.    Yes [provider]  montelukast (SINGULAIR) 10 MG tablet TAKE 1 TABLET BY MOUTH EVERYDAY AT BEDTIME Patient not taking: No sig reported 09/29/18   Binnie Rail, MD  sodium chloride (OCEAN) 0.65 % SOLN nasal spray Place 1 spray into both nostrils as needed for congestion.    [provider]     Critical care time:      Noe Gens, NP-C [facility] Pgr: [phone] or if no answer [phone] 11/12/2018, 2:59 PM

## 2018-11-12 NOTE — Progress Notes (Signed)
  Echocardiogram 2D Echocardiogram has been performed.  Linda Kelly 11/12/2018, 11:04 AM

## 2018-11-12 NOTE — Patient Outreach (Signed)
Reviewing chart, note there is a LIVING WILL and HCPOA on record in the MEDIA section which designates her to be a DO NOT RESUSCITATE.  Linda Kelly. Myrtie Neither, MSN, [facility] Gerontological Nurse Practitioner [facility] Management [phone]

## 2018-11-12 NOTE — Consult Note (Signed)
Consultation Note Date: 11/12/2018   Patient Name: Linda Kelly  DOB: 1928/08/13  MRN: [id number]  Age / Sex: 85 y.o., female  PCP: Binnie Rail, MD Referring Physician: Donne Hazel, MD  Reason for Consultation: Establishing goals of care, Non pain symptom management and Psychosocial/spiritual support  HPI/Patient Profile: 85 y.o. female  admitted on 11/10/2018 with past medical history significant of severe multivessel CAD not amenable to revascularization,widespread PVD,chronic systolic and diastolic heart failure (LVEF 40 to 45%),andpulmonary fibrosison chronic 5 L O2, chronic gastritis, COPD depression, , GERD, and continued physical and functional decline over the past year.   Presented with gradual onset shortness of breath she has attempted to use her rescue inhaler multiple times but did not seem to help EMS was called was given 10 mg of albuterol Atrovent 0.5 mg and Solu-Medrol 125 mg  Significant pulmonary disease with long-term poor prognosis.  Patient has been seen in the community by community-based palliative and discussion regarding hospice has been had in the past.  Patient and family face treatment option decisions, advanced directive decisions and anticipatory care needs    Clinical Assessment and Goals of Care:   This NP Wadie Lessen reviewed medical records, received report from team, assessed the patient and then meet at the patient's bedside along with her daughter Linda Kelly  to discuss diagnosis, prognosis, GOC, EOL wishes disposition and options.  Concept of Hospice and Palliative Care were discussed  A detailed discussion was had today regarding advanced directives.  Concepts specific to code status, artifical feeding and hydration, continued IV antibiotics and rehospitalization was had.  The difference between a aggressive medical intervention path  and a palliative  comfort care path for this patient at this time was had.  Values and goals of care important to patient and family were attempted to be elicited.  Created space and opportunity for patient and her daughter to explore thoughts and feelings regarding current medical situation and the emotional parents facing ones mortality.  Patient verbalizes her gratitude for a full life and her current peaceful state of mind.  MOST form introduced  Natural trajectory and expectations at EOL were discussed.  Questions and concerns addressed.   Family encouraged to call with questions or concerns.    PMT will continue to support holistically.     SUMMARY OF RECOMMENDATIONS    Code Status/Advance Care Planning:  DNR   Symptom Management:   Dyspnea: Roxanol 5 mg p.o./sublingual every 4 hours as needed for dyspnea  Anxiety: Xanax 0.25 mg p.o. 3 times daily as needed  Palliative Prophylaxis:   Aspiration, Bowel Regimen, Delirium Protocol, Frequent Pain Assessment and Oral Care  Additional Recommendations (Limitations, Scope, Preferences):  Patient and family understand the seriousness of the patient's medical condition.  At this time they hope for improvement,  treat the treatable, stabilize and then discharge home with hospice benefit.   Avoid Hospitalization  Psycho-social/Spiritual:   Desire for further Chaplaincy support:no  Additional Recommendations: Education on Hospice  Prognosis:   < 6  months  Discharge Planning: To Be Determined      Primary Diagnoses: Present on Admission: . Hyperlipidemia . Essential hypertension . COPD mixed type ([place]) . GERD . PVD (peripheral vascular disease) ([place]) . NSTEMI myocardial infarction ([place]) . Chronic combined systolic (congestive) and diastolic (congestive) heart failure ([place]) . Pulmonary fibrosis ([place]) . Chronic respiratory failure with hypoxia ([place]) . Demand ischemia ([place]) . Elevated troponin . Severe sepsis ([place]) . Prolonged  QT interval . Sepsis ([place]) . Respiratory failure ([place])   I have reviewed the medical record, interviewed the patient and family, and examined the patient. The following aspects are pertinent.  Past Medical History:  Diagnosis Date  . Anxiety   . Anxiety and depression   . Arthritis   . Benign neoplasm of esophagus   . CAD (coronary artery disease)   . Cancer [facility])    Skin cancer on back  . Carotid artery disease ([place])   . CHF (congestive heart failure) ([place])   . Chronic gastritis   . Colon, diverticulosis   . COPD (chronic obstructive pulmonary disease) ([place])   . Depression   . GERD (gastroesophageal reflux disease)   . Hiatal hernia   . High frequency hearing loss   . Hyperlipidemia   . IBS (irritable bowel syndrome)   . Idiopathic pulmonary fibrosis   . Internal hemorrhoid   . MI (myocardial infarction) ([place]) 1985  . Other nonthrombocytopenic purpuras   . PONV (postoperative nausea and vomiting)   . PVD (peripheral vascular disease) ([place])   . Shortness of breath    with exertion  . Solitary pulmonary nodule   . Stroke [facility])    Hx: of several mini -strokes  . Tinnitus    chronic  . Vertigo    Social History   Socioeconomic History  . Marital status: Widowed    Spouse name: Not on file  . Number of children: 6  . Years of education: Not on file  . Highest education level: Not on file  Occupational History  . Occupation: Armed forces operational officer at [employer]: [place]  . Financial resource strain: Not hard at all  . Food insecurity:    Worry: Never true    Inability: Never true  . Transportation needs:    Medical: No    Non-medical: No  Tobacco Use  . Smoking status: Former Smoker    Packs/day: 0.50    Years: 60.00    Pack years: 30.00    Types: Cigarettes    Last attempt to quit: 02/26/2014    Years since quitting: 4.7  . Smokeless tobacco: Never Used  Substance and Sexual Activity  . Alcohol use: No  . Drug use: No  . Sexual  activity: Never  Lifestyle  . Physical activity:    Days per week: 0 days    Minutes per session: 0 min  . Stress: Only a little  Relationships  . Social connections:    Talks on phone: More than three times a week    Gets together: More than three times a week    Attends religious service: 1 to 4 times per year    Active member of club or organization: Not on file    Attends meetings of clubs or organizations: 1 to 4 times per year    Relationship status: Widowed  Other Topics Concern  . Not on file  Social History Narrative   [place] to  [place] twice a year to visit sister- 3-4 weeks travel there every year.   Pt does not get regular exercise   Family History  Problem Relation Age of Onset  . Heart attack Father        deceased at 7, MGF  . Colon cancer Father   . Prostate cancer Father   . Heart disease Father   . Hyperlipidemia Father   . Heart disease Mother        deaceased at age 64  . Colitis Mother   . Stroke Mother   . Hyperlipidemia Mother   . Colitis Sister   . Hyperlipidemia Sister   . Other Brother        deceased age 57; war  . Hyperlipidemia Brother   . Breast cancer Maternal Aunt        great   . Lung cancer Paternal Aunt        great  . Vasculitis Son   . Hyperlipidemia Son   . Hypertension Son   . Heart attack Son   . Cancer Sister        unknown  . Hyperlipidemia Sister   . Hyperlipidemia Daughter   . Hypertension Daughter   . COPD Neg Hx   . Asthma Neg Hx    Scheduled Meds: . aspirin EC  81 mg Oral QHS  . ezetimibe  10 mg Oral Daily  . feeding supplement (ENSURE ENLIVE)  237 mL Oral BID BM  . furosemide  40 mg Oral BID  . ipratropium-albuterol  3 mL Nebulization Q6H  . isosorbide mononitrate  30 mg Oral Daily  . methylPREDNISolone (SOLU-MEDROL) injection  40 mg Intravenous Q24H  . metoprolol succinate  12.5 mg Oral Daily  . mometasone-formoterol  1 puff Inhalation BID  . sertraline  50 mg  Oral Daily  . simvastatin  40 mg Oral q1800   Continuous Infusions: . heparin 850 Units/hr (11/12/18 0600)   PRN Meds:.acetaminophen **OR** acetaminophen, ALPRAZolam, ALPRAZolam, HYDROcodone-acetaminophen, ipratropium-albuterol, levalbuterol, nitroGLYCERIN Medications Prior to Admission:  Prior to Admission medications   Medication Sig Start Date End Date Taking? Authorizing Provider  acetaminophen (TYLENOL) 650 MG CR tablet Take 650 mg by mouth daily.   Yes [provider]  ALPRAZolam (XANAX) 0.25 MG tablet Take 1 tablet (0.25 mg total) by mouth 3 (three) times daily as needed for anxiety. 10/15/18  Yes Binnie Rail, MD  aspirin EC 81 MG tablet Take 81 mg by mouth at bedtime.    Yes [provider]  cholecalciferol (VITAMIN D) 1000 UNITS tablet Take 1,000 Units by mouth at bedtime.    Yes [provider]  ezetimibe (ZETIA) 10 MG tablet Take 1 tablet (10 mg total) by mouth daily. 08/07/18 11/11/18 Yes Lelon Perla, MD  famotidine (PEPCID) 20 MG tablet Take 1 tablet (20 mg total) by mouth 2 (two) times daily. 09/18/18  Yes Burns, Claudina Lick, MD  furosemide (LASIX) 40 MG tablet TAKE 1 TABLET BY MOUTH TWICE A DAY Patient taking differently: Take 40 mg by mouth 2 (two) times daily.  10/07/18  Yes Lelon Perla, MD  ipratropium-albuterol (DUONEB) 0.5-2.5 (3) MG/3ML SOLN Take 3 mLs by nebulization every 4 (four) hours as needed. Patient taking differently: Take 3 mLs by nebulization every 4 (four) hours as needed (shortness of breath).  09/24/18  Yes Young, Tarri Fuller D, MD  isosorbide mononitrate (IMDUR) 30 MG 24 hr tablet Take 1 tablet (30 mg total) by mouth daily. 04/25/18 11/11/18 Yes Crenshaw, Denice Bors,  MD  metoprolol succinate (TOPROL-XL) 25 MG 24 hr tablet Take 0.5 tablets (12.5 mg total) by mouth daily. 10/13/18  Yes Lelon Perla, MD  nitroGLYCERIN (NITROSTAT) 0.4 MG SL tablet DISSOLVE 1 TABLET UNDER THE TONGUE EVERY 5 MINUTES FOR 3 DOSES AS NEEDED FOR CHEST  PAIN. Patient taking differently: Place 0.4 mg under the tongue every 5 (five) minutes as needed for chest pain.  09/22/18  Yes Burns, Claudina Lick, MD  Omega-3 Fatty Acids (FISH OIL) 1000 MG CAPS Take 1,000 mg by mouth at bedtime.    Yes [provider]  potassium chloride (K-DUR) 10 MEQ tablet TAKE 1/2 TABLET ONCE DAILY Patient taking differently: Take 5 mEq by mouth daily.  09/22/18  Yes Lelon Perla, MD  predniSONE (DELTASONE) 5 MG tablet Take 1 tablet (5 mg total) by mouth daily with breakfast. After prednisone taper completed, start 5mg  daily x 3 weeks 10/16/18  Yes Martyn Ehrich, NP  Propyl Glycol-Hydroxyethylcell (NASAL MOIST) GEL Place 1 application into the nose daily as needed (nasal congestion).   Yes [provider]  sertraline (ZOLOFT) 50 MG tablet TAKE 1 TABLET BY MOUTH EVERY DAY Patient taking differently: Take 50 mg by mouth daily.  08/06/18  Yes Burns, Claudina Lick, MD  simvastatin (ZOCOR) 40 MG tablet TAKE 1 TABLET BY MOUTH EVERY DAY Patient taking differently: Take 40 mg by mouth daily at 6 PM.  07/08/18  Yes Burns, Claudina Lick, MD  vitamin E 400 UNIT capsule Take 400 Units by mouth at bedtime.    Yes [provider]  montelukast (SINGULAIR) 10 MG tablet TAKE 1 TABLET BY MOUTH EVERYDAY AT BEDTIME Patient not taking: No sig reported 09/29/18   Binnie Rail, MD  sodium chloride (OCEAN) 0.65 % SOLN nasal spray Place 1 spray into both nostrils as needed for congestion.    [provider]   No Known Allergies Review of Systems  Constitutional: Positive for fatigue.  Respiratory: Positive for cough and shortness of breath.   Neurological: Positive for weakness.    Physical Exam Constitutional:      General: She is awake.     Appearance: She is well-developed. She is ill-appearing.     Interventions: Face mask in place.  Cardiovascular:     Rate and Rhythm: Tachycardia present.  Pulmonary:     Breath sounds: Decreased air movement present.  Decreased breath sounds present.  Musculoskeletal:     Comments: generlaized weakness and muscle atrophy  Skin:    General: Skin is warm and dry.  Neurological:     Mental Status: She is alert.     Vital Signs: BP 100/65 (BP Location: Left Arm)   Pulse (!) 101   Temp (!) 97.5 F (36.4 C) (Oral)   Resp 16   Ht 4\' 11"  (1.499 m)   Wt 68.3 kg   SpO2 99%   BMI 30.42 kg/m  Pain Scale: 0-10   Pain Score: 0-No pain   SpO2: SpO2: 99 % O2 Device:SpO2: 99 % O2 Flow Rate: .O2 Flow Rate (L/min): 10 L/min  IO: Intake/output summary:   Intake/Output Summary (Last 24 hours) at 11/12/2018 1333 Last data filed at 11/12/2018 0600 Gross per 24 hour  Intake 1246.52 ml  Output 2500 ml  Net -1253.48 ml    LBM: Last BM Date: 11/10/18 Baseline Weight: Weight: 64 kg Most recent weight: Weight: 68.3 kg     Palliative Assessment/Data: 30 % at best   Flowsheet Rows     Most  Recent Value  Intake Tab  Referral Department  Hospitalist  Unit at Time of Referral  ER  Date Notified  11/11/18  Palliative Care Type  Return patient Palliative Care  Reason for referral  Clarify Goals of Care  Date of Admission  11/10/18  # of days IP prior to Palliative referral  1  Clinical Assessment  Psychosocial & Spiritual Assessment  Palliative Care Outcomes     Discussed  with Dr. Wyline Copas   Time In: 1300 Time Out: 1420 Time Total: 80 minutes Greater than 50%  of this time was spent counseling and coordinating care related to the above assessment and plan.  Signed by: Wadie Lessen, NP   Please contact Palliative Medicine Team phone at [phone] for questions and concerns.  For individual provider: See Shea Evans

## 2018-11-12 NOTE — Progress Notes (Signed)
Pt became very SOB with accessory muscle use, wheezing bilaterally, O2 saturation sustaining in the low 80s on 5L [place] at beginning of shift. RRT called to bedside. Pt given breathing treatment but was still unable to maintain her saturation with [place]. Attempted high flow [place] @ 10L but was still unable to maintain saturation. Partial nonrebreather placed @ 10L and Pt O2 came up to 92%. NP on call notified and placed an order for stat chest x-ray.  Pt remained very anxious with increased WOB. Xanax given, NP on call notified. NP placed order for one time dose of IV lasix. After diuresing pt was more comfortable with unlabored breathing.   This RN attempted to wean pt off partial non rebreather, but each time pt was placed on high flow NS @ 15L pt desaturated down to 60s-70%. Partial non rebreather placed back on pt, O2 saturation 92-100%. Pt stable at this time. Will continue to monitor closely.

## 2018-11-12 NOTE — Telephone Encounter (Signed)
Called patients daughter, advised her of response below. Verbalized understanding. Nothing further needed.

## 2018-11-12 NOTE — Telephone Encounter (Signed)
Called and spoke with Clair Gulling, he stated that he received the fax that was sent to him in regards to the patient. Patient stated that the patient is now no longer with [facility] PT. Clair Gulling stated that the fax was in regards to the oxymizer, the nasal gel and the duoneb, Clair Gulling stated that we would need to contact the patients daughter.  Called patients daughter unable to reach left message to give [place] a call back.

## 2018-11-12 NOTE — Telephone Encounter (Signed)
If they call back, Hospital doctor can leave a staff message or consult pulmonary if appropriate while she is at the hospital..

## 2018-11-12 NOTE — Progress Notes (Signed)
ANTICOAGULATION CONSULT NOTE - Follow Up Consult  Pharmacy Consult for Heparin  Indication: chest pain/ACS  No Known Allergies  Patient Measurements: Height: 4\' 11"  (149.9 cm) Weight: 150 lb 9.6 oz (68.3 kg) IBW/kg (Calculated) : 43.2 Heparin Dosing Weight: 58.3 kg  Vital Signs: Temp: 97.5 F (36.4 C) (01/15 1307) Temp Source: Oral (01/15 1307) BP: 100/65 (01/15 1307) Pulse Rate: 101 (01/15 1307)  Labs: Recent Labs    11/10/18 1816 11/10/18 1938 11/10/18 2341  11/11/18 0829 11/11/18 1731 11/12/18 0225 11/12/18 1222  HGB 13.7  --   --   --  11.3* 11.3* 12.0  --   HCT 44.1  --   --   --  36.8 35.9* 38.5  --   PLT 232  --   --   --  187 185 239  --   APTT  --  24  --   --   --   --   --   --   LABPROT  --  14.0  --   --   --   --   --   --   INR  --  1.09  --   --   --   --   --   --   HEPARINUNFRC  --   --   --    < > 0.35 0.25* 0.40 0.34  CREATININE 1.59*  --   --   --   --  1.05* 1.02*  --   TROPONINI  --   --  1.67*  --  1.18* 0.88*  --   --    < > = values in this interval not displayed.    Estimated Creatinine Clearance: 32 mL/min (A) (by C-G formula based on SCr of 1.02 mg/dL (H)).   Medications:  Scheduled:  . aspirin EC  81 mg Oral QHS  . ezetimibe  10 mg Oral Daily  . feeding supplement (ENSURE ENLIVE)  237 mL Oral BID BM  . furosemide  40 mg Oral BID  . ipratropium-albuterol  3 mL Nebulization Q6H  . isosorbide mononitrate  30 mg Oral Daily  . methylPREDNISolone (SOLU-MEDROL) injection  40 mg Intravenous Q24H  . metoprolol succinate  12.5 mg Oral Daily  . mometasone-formoterol  1 puff Inhalation BID  . sertraline  50 mg Oral Daily  . simvastatin  40 mg Oral q1800   Infusions:  . heparin 850 Units/hr (11/12/18 0600)   PRN: acetaminophen **OR** acetaminophen, ALPRAZolam, ALPRAZolam, HYDROcodone-acetaminophen, ipratropium-albuterol, levalbuterol, nitroGLYCERIN   Assessment: 85 yo F with hx CAD presents with NSTEMI with plans for medical  management.  No bolus, low dose drip per Cardiology.  Today, 11/12/2018:  Heparin level therapeutic (0.34) on 850 units/hr  CBC: Hgb low but stable; Plt WNL  No bleeding reported  Goal of Therapy: Heparin level 0.3-0.5 units/ml Monitor platelets by anticoagulation protocol: Yes  Plan:  Continue heparin IV infusion at 850 units/hr  Daily CBC, daily heparin level  Monitor for signs of bleeding or thrombosis   Peggyann Juba, PharmD, BCPS Pager: [phone] 11/12/2018, 1:09 PM

## 2018-11-12 NOTE — Progress Notes (Signed)
OT Cancellation Note  Patient Details Name: Linda Kelly MRN: [id number] DOB: 04-Sep-1928   Cancelled Treatment:    Reason Eval/Treat Not Completed: Other (comment);Medical issues which prohibited therapy   Reason Eval/Treat Not Completed: Medical issues which prohibited therapy(Pt is on 10L O2 partial non rebreather, RN stated pt not able to tolerate activity at present and requested PT hold today. Also noted VQ scan pending.  Will check back tomorrow.   Lin Landsman Mickel Baas, OT Acute Rehabilitation Services [phone] [phone]    11/12/2018, 3:44 PM

## 2018-11-12 NOTE — Progress Notes (Signed)
PT Cancellation Note  Patient Details Name: Linda Kelly MRN: [id number] DOB: 05/31/1928   Cancelled Treatment:    Reason Eval/Treat Not Completed: Medical issues which prohibited therapy(Pt is on 10L O2 partial non rebreather, RN stated pt not able to tolerate activity at present and requested PT hold today. Also noted VQ scan pending.  Will check back tomorrow. )  Philomena Doheny PT 11/12/2018  Acute Rehabilitation Services Pager [phone] Office [phone]

## 2018-11-12 NOTE — Progress Notes (Signed)
Pt BP manually 78/60, pt lethargic but able to arouse with voice/touch. NP on call notified. Order placed for 500cc bolus. Will continue to monitor closely.

## 2018-11-12 NOTE — Progress Notes (Addendum)
Initial Nutrition Assessment  DOCUMENTATION CODES:   Not applicable  INTERVENTION:  - Will order Ensure Enlive BID, each supplement provides 350 kcal and 20 grams of protein--can be used to give PO medications.   NUTRITION DIAGNOSIS:   Inadequate oral intake related to acute illness(SOB requiring NRB mask) as evidenced by per patient/family report.  GOAL:   Patient will meet greater than or equal to 90% of their needs   MONITOR:   PO intake, Supplement acceptance, Weight trends, Labs  REASON FOR ASSESSMENT:   Consult Assessment of nutrition requirement/status  ASSESSMENT:   85 year old female with past medical history of severe multivessel CAD, PVD, CHF, pulmonary fibrosis with proximal respiratory failure, COPD, GERD, depression, and history of CVA. She presented to the ED with dyspnea, gradual worsening of SOB. CT of the abdomen pelvis showed no acute processes. CXR showed chronic fibrotic changes. Patient was admitted with working diagnosis of suspected sepsis complicated by type II MI/demand ischemia.   No intakes documented since admission. Patient was eating breakfast at the time of RD visit. She reports that it takes her a long time to eat a meal because she is only able to take NRB mask off to take a few bites and then has to replace it for a while before being able to take another few bites. Patient reports good appetite with no chewing or swallowing issues. She denies abdominal pain or nausea. Reports that SOB is the only limiting factor for eating.   Per chart review, current weight is 151 lb which is up from weight which was previously fairly stable (137-141 lb) since 07/30/18. Noted lasix order; suspect current weight is falsely elevated.    Medications reviewed; 60 mg IV lasix x1 dose 1/14, 40 mg IV lasix BID, 40 mg solu-medrol/day, 10 mEq IV KCl x2 runs 1/14. Labs reviewed; Cl: 113 mmol/l, BUN: 25 mg/dl, creatinine: 1.02 mg/dl, GFR: 49 ml/min.       NUTRITION  - FOCUSED PHYSICAL EXAM:  Completed; no muscle and no fat wasting, mild edema to BLE.   Diet Order:   Diet Order            Diet Heart Room service appropriate? Yes; Fluid consistency: Thin  Diet effective now              EDUCATION NEEDS:   No education needs have been identified at this time  Skin:  Skin Assessment: Reviewed RN Assessment  Last BM:  1/13 (date of admission)  Height:   Ht Readings from Last 1 Encounters:  11/11/18 4\' 11"  (1.499 m)    Weight:   Wt Readings from Last 1 Encounters:  11/11/18 68.3 kg    Ideal Body Weight:  42.91 kg  BMI:  Body mass index is 30.42 kg/m.  Estimated Nutritional Needs:   Kcal:  1350-1550 kcal  Protein:  55-65 grams  Fluid:  >/= 1.5 L/day     Jarome Matin, MS, RD, LDN, [facility] Inpatient Clinical Dietitian Pager # [phone] After hours/weekend pager # [phone]

## 2018-11-12 NOTE — Telephone Encounter (Signed)
Called and spoke with patients daughter, she stated that the patient is currently in the hospital at [facility] long admitted. Patients daughter stated that the doctor who is seeing the patient had tried to get in touch with CY. I will route this over to both CY and [name]. The nurse at the hospital did not have a number to give me for the physician. The doctor is Dr. Sherrian Divers.

## 2018-11-12 NOTE — Progress Notes (Signed)
ANTICOAGULATION CONSULT NOTE - Follow Up Consult  Pharmacy Consult for Heparin Indication: chest pain/ACS  No Known Allergies  Patient Measurements: Height: 4\' 11"  (149.9 cm) Weight: 150 lb 9.6 oz (68.3 kg) IBW/kg (Calculated) : 43.2 Heparin Dosing Weight:   Vital Signs: Temp: 97.5 F (36.4 C) (01/15 0518) Temp Source: Oral (01/15 0518) BP: 90/40 (01/15 0612) Pulse Rate: 67 (01/15 0612)  Labs: Recent Labs    11/10/18 1816 11/10/18 1938 11/10/18 2341 11/11/18 0829 11/11/18 1731 11/12/18 0225  HGB 13.7  --   --  11.3* 11.3* 12.0  HCT 44.1  --   --  36.8 35.9* 38.5  PLT 232  --   --  187 185 239  APTT  --  24  --   --   --   --   LABPROT  --  14.0  --   --   --   --   INR  --  1.09  --   --   --   --   HEPARINUNFRC  --   --   --  0.35 0.25* 0.40  CREATININE 1.59*  --   --   --  1.05* 1.02*  TROPONINI  --   --  1.67* 1.18* 0.88*  --     Estimated Creatinine Clearance: 32 mL/min (A) (by C-G formula based on SCr of 1.02 mg/dL (H)).   Medications:  Infusions:  . heparin 850 Units/hr (11/12/18 0531)  . sodium chloride 500 mL (11/12/18 0532)    Assessment: Patient with heparin level at goal.  No heparin issues noted.  Goal of Therapy:  Heparin level 0.3-0.7 units/ml Monitor platelets by anticoagulation protocol: Yes   Plan:  Continue heparin drip at current rate Recheck level at [address] [employer] [place] 11/12/2018,6:19 AM

## 2018-11-12 NOTE — Consult Note (Addendum)
   [facility] CM Inpatient Consult   11/12/2018  Linda Kelly 1928/03/09 [id number]    [name] is active with [facility] program. She is followed by [facility] NP who is aware of hospitalization.  Spoke with both inpatient RNCM and MD. Made aware that [facility] active prior to admission. Palliative consult notes are pending.   Spoke with [name] at bedside. She was on nonrebreather mask. Offered support.  Will continue to follow for disposition plans. Will update [facility] NP accordingly.    Marthenia Rolling, MSN-Ed, RN,BSN [facility] Liaison [phone]

## 2018-11-13 ENCOUNTER — Ambulatory Visit: Payer: Self-pay | Admitting: *Deleted

## 2018-11-13 DIAGNOSIS — Z515 Encounter for palliative care: Secondary | ICD-10-CM

## 2018-11-13 LAB — CBC
HCT: 32.5 % — ABNORMAL LOW (ref 36.0–46.0)
Hemoglobin: 10.3 g/dL — ABNORMAL LOW (ref 12.0–15.0)
MCH: 32.7 pg (ref 26.0–34.0)
MCHC: 31.7 g/dL (ref 30.0–36.0)
MCV: 103.2 fL — ABNORMAL HIGH (ref 80.0–100.0)
Platelets: 187 10*3/uL (ref 150–400)
RBC: 3.15 MIL/uL — ABNORMAL LOW (ref 3.87–5.11)
RDW: 15.9 % — ABNORMAL HIGH (ref 11.5–15.5)
WBC: 20.6 10*3/uL — ABNORMAL HIGH (ref 4.0–10.5)
nRBC: 0 % (ref 0.0–0.2)

## 2018-11-13 LAB — HEPARIN LEVEL (UNFRACTIONATED): Heparin Unfractionated: 0.41 IU/mL (ref 0.30–0.70)

## 2018-11-13 MED ORDER — IPRATROPIUM-ALBUTEROL 0.5-2.5 (3) MG/3ML IN SOLN
3.0000 mL | Freq: Two times a day (BID) | RESPIRATORY_TRACT | Status: DC
  Administered 2018-11-13 – 2018-11-19 (×12): 3 mL via RESPIRATORY_TRACT
  Filled 2018-11-13 (×12): qty 3

## 2018-11-13 MED ORDER — HEPARIN SODIUM (PORCINE) 5000 UNIT/ML IJ SOLN
5000.0000 [IU] | Freq: Three times a day (TID) | INTRAMUSCULAR | Status: DC
  Administered 2018-11-13 – 2018-11-19 (×18): 5000 [IU] via SUBCUTANEOUS
  Filled 2018-11-13 (×17): qty 1

## 2018-11-13 NOTE — Progress Notes (Signed)
PROGRESS NOTE    Linda Kelly  QBH:[id number] DOB: [birth year] DOA: 11/10/2018 PCP: Binnie Rail, MD    Brief Narrative:  85 year old female who presented with dyspnea. She does have significant past medical history for severe multivessel coronary artery disease not  for revascularization, peripheral vascular disease, chronic diastolic heart failure, pulmonary fibrosis with proximal respiratory failure, COPD, GERD, depression and history of CVA.  Reported gradual worsening of shortness of breath, refractive to have bronchodilators at home.  She will physical examination blood pressure 114/98, heart rate 93, respiratory rate 28, temperature 97.7, oxygen saturation 98% on supplemental oxygen, she had dry mucous membranes, lungs with wheezing and rales bilaterally, heart S1-S2 present rhythmic, abdomen soft nontender, no lower extremity edema.  Sodium 143, potassium 3.4, chloride 102, bicarb 24, glucose 213, BUN 23, creatinine 1.59, white count 13.4, hemoglobin 13.7, hematocrit 44.1, platelets 232, troponin 2.0.  Urine analysis was negative for infection.  CT of the abdomen pelvis with no acute process.  Chest x-ray with chronic fibrotic changes.  EKG with sinus rhythm, left axis deviation, prolonged QTC.  Patient was admitted to the hospital with working diagnosis of suspected sepsis complicated by type II MI/demand ischemia.    Assessment & Plan:   Active Problems:   Hyperlipidemia   Essential hypertension   CAD S/P percutaneous coronary angioplasty 1985   COPD mixed type (HCC)   GERD   Dyspnea   PVD (peripheral vascular disease) (HCC)   NSTEMI myocardial infarction (HCC)   Chronic combined systolic (congestive) and diastolic (congestive) heart failure (HCC)   Pulmonary fibrosis (HCC)   Chronic respiratory failure with hypoxia (HCC)   Demand ischemia (HCC)   Elevated troponin   Severe sepsis (HCC)   Prolonged QT interval   Sepsis ([place])   Respiratory failure ([place])   1. Sepsis  with unclear source of infection. Patient has remained hemodynamically stable, no source of infection, abx on hold for now, will continue to follow cell count, cultures and temperature curve. Continue telemetry monitoring on the medical ward.   2. Pulmonary fibrosis and COPD with acute on chronic hypoxic respiratory failure. Chest film with no new infiltrates that may suggest IPF flare. Pt had been continued on low dose methylprednisolone and bronchodilator therapy for presumed underlying COPD exacerbation.  Initial question of PE, thus VQ was ordered, however pt is unable to perform study given O2 requirements. Heparin drip was started empirically, transition to prophylactic anticoagulation -Appreciate input by Pulmonary. Recommendation to wean steroids as tolerated and avoid vol overload. Still on IV steroids at this time  3. MI type 2. No clinical signs of acute coronary syndrome, will continue close monitoring of telemetry. No chest pain or significant ekg changes. Patient is known to have advance CAD. Check echocardiogram. Continued on isosorbide, metoprolol, simvastatin and as needed nitroglycerin. No chest pain at this time  4. Diastolic heart failure. Seems to be stable. Recent CXR reviewed personally. Findings suggestive of edema, however pt's bp did not tolerate lasix. Continued on PO diuretic as tolerated. Pulm rec to avoid vol overload  5. Dementia. No agitation, continue neuro checks per unit protocol.   6. AKI, Rneal function with serum cr at 1,59 with K at 3,4 and serum bicarbonate at 24. Pt is continued on PO lasix, bp hypotensive recently after receiving IV lasix. Repeat bmet in AM   DVT prophylaxis: Heparin gtt Code Status: DNR Family Communication: Pt in room Disposition Plan: Uncertain at this time  Consultants:   [place]  Procedures:     Antimicrobials: Anti-infectives (From admission, onward)   Start     Dose/Rate Route Frequency Ordered Stop    11/12/18 1800  vancomycin (VANCOCIN) IVPB 750 mg/150 ml premix  Status:  Discontinued     750 mg 150 mL/hr over 60 Minutes Intravenous Every 48 hours 11/11/18 1423 11/11/18 1543   11/11/18 2200  ceFEPIme (MAXIPIME) 1 g in sodium chloride 0.9 % 100 mL IVPB  Status:  Discontinued     1 g 200 mL/hr over 30 Minutes Intravenous Every 24 hours 11/11/18 1423 11/11/18 1543   11/10/18 1945  metroNIDAZOLE (FLAGYL) IVPB 500 mg  Status:  Discontinued     500 mg 100 mL/hr over 60 Minutes Intravenous Every 8 hours 11/10/18 1939 11/11/18 1543   11/10/18 1945  ceFEPIme (MAXIPIME) 2 g in sodium chloride 0.9 % 100 mL IVPB     2 g 200 mL/hr over 30 Minutes Intravenous  Once 11/10/18 1943 11/10/18 2125   11/10/18 1945  vancomycin (VANCOCIN) 1,500 mg in sodium chloride 0.9 % 500 mL IVPB     1,500 mg 250 mL/hr over 120 Minutes Intravenous  Once 11/10/18 1943 11/10/18 2358      Subjective: States breathing better today Objective: Vitals:   11/13/18 1013 11/13/18 1019 11/13/18 1619 11/13/18 1634  BP: (!) 81/61  (!) 80/51 (!) 88/71  Pulse: 90  92 92  Resp:      Temp:    98 F (36.7 C)  TempSrc:    Oral  SpO2: (!) 84% 92%  96%  Weight:      Height:        Intake/Output Summary (Last 24 hours) at 11/13/2018 1650 Last data filed at 11/13/2018 1200 Gross per 24 hour  Intake 34 ml  Output 1500 ml  Net -1466 ml   Filed Weights   11/10/18 1756 11/11/18 1700 11/13/18 0411  Weight: 64 kg 68.3 kg 65.2 kg    Examination: General exam: Awake, laying in bed, in nad Respiratory system: Normal respiratory effort, no wheezing, on [place] Cardiovascular system: regular rate, s1, s2 Gastrointestinal system: Soft, nondistended, positive BS Central nervous system: CN2-12 grossly intact, strength intact Extremities: Perfused, no clubbing Skin: Normal skin turgor, no notable skin lesions seen Psychiatry: Mood normal // no visual hallucinations   Data Reviewed: I have personally reviewed following labs and imaging  studies  CBC: Recent Labs  Lab 11/10/18 1816 11/11/18 0829 11/11/18 1731 11/12/18 0225 11/13/18 0554  WBC 13.4* 13.3* 23.8* 28.4* 20.6*  NEUTROABS 11.2*  --   --   --   --   HGB 13.7 11.3* 11.3* 12.0 10.3*  HCT 44.1 36.8 35.9* 38.5 32.5*  MCV 104.5* 101.9* 103.5* 104.1* 103.2*  PLT 232 187 185 239 578   Basic Metabolic Panel: Recent Labs  Lab 11/10/18 1816 11/11/18 1731 11/12/18 0225  NA 143 142  --   K 3.4* 4.4  --   CL 102 113*  --   CO2 24 22  --   GLUCOSE 203* 107*  --   BUN 23 25*  --   CREATININE 1.59* 1.05* 1.02*  CALCIUM 8.2* 7.7*  --   MG  --  1.9  --   PHOS  --  3.4  --    GFR: Estimated Creatinine Clearance: 31.3 mL/min (A) (by C-G formula based on SCr of 1.02 mg/dL (H)). Liver Function Tests: Recent Labs  Lab 11/10/18 1816 11/11/18 1731  AST 31 25  ALT 17 16  ALKPHOS 58  48  BILITOT 1.2 0.7  PROT 6.6 5.7*  ALBUMIN 3.7 3.0*   No results for input(s): LIPASE, AMYLASE in the last 168 hours. No results for input(s): AMMONIA in the last 168 hours. Coagulation Profile: Recent Labs  Lab 11/10/18 1938  INR 1.09   Cardiac Enzymes: Recent Labs  Lab 11/10/18 2341 11/11/18 0829 11/11/18 1731  TROPONINI 1.67* 1.18* 0.88*   BNP (last 3 results) Recent Labs    07/23/18 1111  PROBNP 162.0*   HbA1C: No results for input(s): HGBA1C in the last 72 hours. CBG: No results for input(s): GLUCAP in the last 168 hours. Lipid Profile: No results for input(s): CHOL, HDL, LDLCALC, TRIG, CHOLHDL, LDLDIRECT in the last 72 hours. Thyroid Function Tests: Recent Labs    11/11/18 1731  TSH 0.345*   Anemia Panel: No results for input(s): VITAMINB12, FOLATE, FERRITIN, TIBC, IRON, RETICCTPCT in the last 72 hours. Sepsis Labs: Recent Labs  Lab 11/10/18 1932 11/10/18 2121 11/11/18 1731 11/11/18 1950  PROCALCITON  --   --  <0.10  --   LATICACIDVEN 6.42* 7.25* 2.7* 3.2*    Recent Results (from the past 240 hour(s))  Culture, blood (routine x 2)      Status: None (Preliminary result)   Collection Time: 11/10/18  7:26 PM  Result Value Ref Range Status   Specimen Description   Final    RIGHT ANTECUBITAL Performed at [facility], [place] [address] [place], [place] [address]    Special Requests   Final    BOTTLES DRAWN AEROBIC AND ANAEROBIC Blood Culture adequate volume Performed at [facility] [address], [place], [place] [address]    Culture   Final    NO GROWTH 3 DAYS Performed at [facility], [place] [address], [place], [place] [address]    Report Status PENDING  Incomplete  Culture, blood (routine x 2)     Status: Abnormal   Collection Time: 11/10/18  7:31 PM  Result Value Ref Range Status   Specimen Description   Final    LEFT ANTECUBITAL Performed at [place] [facility] [address] [place], [place] [address]    Special Requests   Final    BOTTLES DRAWN AEROBIC AND ANAEROBIC Blood Culture adequate volume Performed at [facility] [address], [place],  [address]    Culture  Setup Time   Final    GRAM POSITIVE COCCI IN CHAINS ANAEROBIC BOTTLE ONLY CRITICAL RESULT CALLED TO, READ BACK BY AND VERIFIED WITH: Elenore Paddy PharmD 16:50 11/11/18 ([email or web address])    Culture (A)  Final    VIRIDANS STREPTOCOCCUS THE SIGNIFICANCE OF ISOLATING THIS ORGANISM FROM A SINGLE SET OF BLOOD CULTURES WHEN MULTIPLE SETS ARE DRAWN IS UNCERTAIN. PLEASE NOTIFY THE MICROBIOLOGY DEPARTMENT WITHIN ONE WEEK IF SPECIATION AND SENSITIVITIES ARE REQUIRED. Performed at [facility], [place] [address] [place],  [address]    Report Status 11/13/2018 FINAL  Final  Blood Culture ID Panel (Reflexed)     Status: Abnormal   Collection Time: 11/10/18  7:31 PM  Result Value Ref Range Status   Enterococcus species NOT DETECTED NOT DETECTED Final   Listeria monocytogenes NOT DETECTED NOT DETECTED Final   Staphylococcus species NOT DETECTED NOT DETECTED Final    Staphylococcus aureus (BCID) NOT DETECTED NOT DETECTED Final   Streptococcus species DETECTED (A) NOT DETECTED Final    Comment: Not Enterococcus species, Streptococcus agalactiae, Streptococcus pyogenes, or Streptococcus pneumoniae. CRITICAL RESULT CALLED TO, READ  BACK BY AND VERIFIED WITH: Elenore Paddy PharmD 16:50 11/11/18 ([email or web address])    Streptococcus agalactiae NOT DETECTED NOT DETECTED Final   Streptococcus pneumoniae NOT DETECTED NOT DETECTED Final   Streptococcus pyogenes NOT DETECTED NOT DETECTED Final   Acinetobacter baumannii NOT DETECTED NOT DETECTED Final   Enterobacteriaceae species NOT DETECTED NOT DETECTED Final   Enterobacter cloacae complex NOT DETECTED NOT DETECTED Final   Escherichia coli NOT DETECTED NOT DETECTED Final   Klebsiella oxytoca NOT DETECTED NOT DETECTED Final   Klebsiella pneumoniae NOT DETECTED NOT DETECTED Final   Proteus species NOT DETECTED NOT DETECTED Final   Serratia marcescens NOT DETECTED NOT DETECTED Final   Haemophilus influenzae NOT DETECTED NOT DETECTED Final   Neisseria meningitidis NOT DETECTED NOT DETECTED Final   Pseudomonas aeruginosa NOT DETECTED NOT DETECTED Final   Candida albicans NOT DETECTED NOT DETECTED Final   Candida glabrata NOT DETECTED NOT DETECTED Final   Candida krusei NOT DETECTED NOT DETECTED Final   Candida parapsilosis NOT DETECTED NOT DETECTED Final   Candida tropicalis NOT DETECTED NOT DETECTED Final    Comment: Performed at [facility], [place] [address] [place], [place] [address]  Urine Culture     Status: Abnormal   Collection Time: 11/10/18 10:38 PM  Result Value Ref Range Status   Specimen Description   Final    URINE, CLEAN CATCH Performed at [facility], [place] [address], [place], [place] [address]    Special Requests   Final    NONE Performed at [facility], [place] [address] [place], [place] [address]    Culture (A)  Final    <10,000 COLONIES/mL INSIGNIFICANT  GROWTH Performed at [facility] [address], [place], [place] [address]    Report Status 11/12/2018 FINAL  Final  Respiratory Panel by PCR     Status: None   Collection Time: 11/10/18 11:15 PM  Result Value Ref Range Status   Adenovirus NOT DETECTED NOT DETECTED Final   Coronavirus 229E NOT DETECTED NOT DETECTED Final   Coronavirus HKU1 NOT DETECTED NOT DETECTED Final   Coronavirus NL63 NOT DETECTED NOT DETECTED Final   Coronavirus OC43 NOT DETECTED NOT DETECTED Final   Metapneumovirus NOT DETECTED NOT DETECTED Final   Rhinovirus / Enterovirus NOT DETECTED NOT DETECTED Final   Influenza A NOT DETECTED NOT DETECTED Final   Influenza B NOT DETECTED NOT DETECTED Final   Parainfluenza Virus 1 NOT DETECTED NOT DETECTED Final   Parainfluenza Virus 2 NOT DETECTED NOT DETECTED Final   Parainfluenza Virus 3 NOT DETECTED NOT DETECTED Final   Parainfluenza Virus 4 NOT DETECTED NOT DETECTED Final   Respiratory Syncytial Virus NOT DETECTED NOT DETECTED Final   Bordetella pertussis NOT DETECTED NOT DETECTED Final   Chlamydophila pneumoniae NOT DETECTED NOT DETECTED Final   Mycoplasma pneumoniae NOT DETECTED NOT DETECTED Final    Comment: Performed at [facility], [place] [address] [place], [place] [address]     Radiology Studies: Dg Chest [place] 1 View  Result Date: 11/11/2018 CLINICAL DATA:  85 year old female with shortness of breath. EXAM: PORTABLE CHEST 1 VIEW COMPARISON:  11/10/2018 and earlier. FINDINGS: Portable AP semi upright view at 2010 hours. Diffuse bilateral increased pulmonary interstitial opacity with basilar predominance. Stable lung volumes and mediastinal contours. Visualized tracheal air column is within normal limits. No pneumothorax. No definite pleural effusion. No consolidation identified. IMPRESSION: Acute pulmonary edema suspected, with main differential consideration of acute viral/atypical respiratory infection. Electronically Signed  By: Genevie Ann M.D.   On:  11/11/2018 20:53    Scheduled Meds: . aspirin EC  81 mg Oral QHS  . ezetimibe  10 mg Oral Daily  . feeding supplement (ENSURE ENLIVE)  237 mL Oral BID BM  . furosemide  40 mg Oral BID  . heparin injection (subcutaneous)  5,000 Units Subcutaneous Q8H  . ipratropium-albuterol  3 mL Nebulization BID  . isosorbide mononitrate  30 mg Oral Daily  . methylPREDNISolone (SOLU-MEDROL) injection  40 mg Intravenous Q24H  . metoprolol succinate  12.5 mg Oral Daily  . mometasone-formoterol  1 puff Inhalation BID  . sertraline  50 mg Oral Daily  . simvastatin  40 mg Oral q1800   Continuous Infusions:    LOS: 3 days   Marylu Lund, MD [facility] Pager On Amion  If 7PM-7AM, please contact night-coverage 11/13/2018, 4:50 PM

## 2018-11-13 NOTE — Evaluation (Signed)
Occupational Therapy Evaluation Patient Details Name: Linda Kelly MRN: [id number] DOB: 1928-02-06 Today's Date: 11/13/2018    History of Present Illness 85 y.o. female w/ a hx of pulmonary fibrosis on chronic home oxygen of 4-5 L, COPD, PVD, multivessel CAD, and combined systolic and diastolic heart failure (last EF 40-45%) and admitted to the hospital with working diagnosis of suspected sepsis complicated by type II MI/demand ischemia.    Clinical Impression   Pt admitted with fever. Pt currently with functional limitations due to the deficits listed below (see OT Problem List).  Pt will benefit from skilled OT to increase their safety and independence with ADL and functional mobility for ADL to facilitate discharge to venue listed below.    Follow Up Recommendations  Home health OT;Supervision/Assistance - 24 hour(depending on progress and care at home)    Equipment Recommendations  None recommended by OT    Recommendations for Other Services       Precautions / Restrictions Precautions Precautions: Fall Precaution Comments: chronic oxygen      Mobility Bed Mobility Overal bed mobility: Needs Assistance Bed Mobility: Supine to Sit     Supine to sit: Min assist     General bed mobility comments:  increased time  Transfers Overall transfer level: Needs assistance Equipment used: Rolling walker (2 wheeled) Transfers: Sit to/from [facility] Sit to Stand: Min guard Stand pivot transfers: Min assist;Mod assist       General transfer comment: VC for safe technique    Balance Overall balance assessment: Needs assistance         Standing balance support: Bilateral upper extremity supported Standing balance-Leahy Scale: Poor Standing balance comment: requires UE support                           ADL either performed or assessed with clinical judgement   ADL Overall ADL's : Needs assistance/impaired Eating/Feeding: Set up;Sitting    Grooming: Sitting;Set up   Upper Body Bathing: Minimal assistance;Sitting   Lower Body Bathing: Moderate assistance;Sit to/from stand;Cueing for sequencing;Cueing for safety   Upper Body Dressing : Set up;Sitting   Lower Body Dressing: Moderate assistance;Sit to/from stand;Cueing for sequencing;Cueing for safety   Toilet Transfer: Minimal assistance;BSC;Stand-pivot   Toileting- Clothing Manipulation and Hygiene: Minimal assistance;Sit to/from stand       Functional mobility during ADLs: Rolling walker;Minimal assistance       Vision Patient Visual Report: No change from baseline       Perception     Praxis      Pertinent Vitals/Pain Pain Assessment: No/denies pain     Hand Dominance     Extremity/Trunk Assessment Upper Extremity Assessment Upper Extremity Assessment: Generalized weakness   Lower Extremity Assessment Lower Extremity Assessment: Generalized weakness       Communication Communication Communication: No difficulties   Cognition Arousal/Alertness: Awake/alert Behavior During Therapy: WFL for tasks assessed/performed Overall Cognitive Status: Within Functional Limits for tasks assessed                                                Home Living Family/patient expects to be discharged to:: Private residence Living Arrangements: Alone Available Help at Discharge: Family;Available 24 hours/day(daughter check on pt) Type of Home: House Home Access: Stairs to enter [employer] of Steps: 1 Entrance Stairs-Rails: Right Home Layout:  One level               Home Equipment: [place] - 4 wheels;Cane - single point;Shower seat   Additional Comments: 5 L home O2      Prior Functioning/Environment Level of Independence: Needs assistance  Gait / Transfers Assistance Needed: Uses a RW for functional mobility ADL's / Homemaking Assistance Needed: Pt requiring assistance for dressing/bathing and family performs all  IADLs            OT Problem List: Decreased strength;Decreased activity tolerance;Impaired balance (sitting and/or standing);Decreased safety awareness;Decreased knowledge of precautions;Cardiopulmonary status limiting activity;Decreased knowledge of use of DME or AE      OT Treatment/Interventions: Self-care/ADL training;Patient/family education;Therapeutic activities;Energy conservation    OT Goals(Current goals can be found in the care plan section) Acute Rehab OT Goals Patient Stated Goal: home with daugther OT Goal Formulation: With patient Time For Goal Achievement: 11/27/18 Potential to Achieve Goals: Good  OT Frequency: Min 2X/week   Barriers to D/C:               AM-PAC OT "6 Clicks" Daily Activity     Outcome Measure Help from another person eating meals?: None Help from another person taking care of personal grooming?: A Little Help from another person toileting, which includes using toliet, bedpan, or urinal?: A Little Help from another person bathing (including washing, rinsing, drying)?: A Little Help from another person to put on and taking off regular upper body clothing?: A Little Help from another person to put on and taking off regular lower body clothing?: A Lot 6 Click Score: 18   End of Session Equipment Utilized During Treatment: Rolling walker Nurse Communication: Mobility status  Activity Tolerance: Patient limited by fatigue Patient left: in chair  OT Visit Diagnosis: Unsteadiness on feet (R26.81);Muscle weakness (generalized) (M62.81);Other abnormalities of gait and mobility (R26.89)                Time: 1245-1301 OT Time Calculation (min): 16 min Charges:  OT General Charges $OT Visit: 1 Visit OT Evaluation $OT Eval Moderate Complexity: 1 Mod  Kari Baars, [place] [phone] Office- [phone], Edwena Felty D 11/13/2018, 2:34 PM

## 2018-11-13 NOTE — Evaluation (Signed)
Physical Therapy Evaluation Patient Details Name: Linda Kelly MRN: [id number] DOB: 21-Aug-1928 Today's Date: 11/13/2018   History of Present Illness  85 y.o. female w/ a hx of pulmonary fibrosis on chronic home oxygen of 4-5 L, COPD, PVD, multivessel CAD, and combined systolic and diastolic heart failure (last EF 40-45%) and admitted to the hospital with working diagnosis of suspected sepsis complicated by type II MI/demand ischemia.   Clinical Impression  Pt admitted with above diagnosis. Pt currently with functional limitations due to the deficits listed below (see PT Problem List).  Pt will benefit from skilled PT to increase their independence and safety with mobility to allow discharge to the venue listed below.  Pt very agreeable to mobilize and performed distance to tolerance.  Pt requiring increased oxygen demand with activity (5L at baseline, currently on 6L and SPo2 dropped to 75% with ambulation).  Pt reports living alone.  Recommend 24/7 assist upon d/c so if family is not able to provide, pt may need SNF.      Follow Up Recommendations SNF    Equipment Recommendations  None recommended by PT    Recommendations for Other Services       Precautions / Restrictions Precautions Precautions: Fall Precaution Comments: chronic oxygen      Mobility  Bed Mobility Overal bed mobility: Needs Assistance Bed Mobility: Supine to Sit     Supine to sit: Min guard;HOB elevated     General bed mobility comments: increased time and effort, utilized elevated HOB and rail  Transfers Overall transfer level: Needs assistance Equipment used: Rolling walker (2 wheeled) Transfers: Sit to/from Stand Sit to Stand: Min guard         General transfer comment: verbal cues for safe technique, min/guard for safety, pt reported initial dizziness however resolved quickly  Ambulation/Gait Ambulation/Gait assistance: Min guard Gait Distance (Feet): 32 Feet Assistive device: Rolling walker  (2 wheeled) Gait Pattern/deviations: Step-through pattern;Decreased stride length     General Gait Details: slow but steady pace with RW, distance per pt preference, SpO2 dropped to 75% on 6L O2 [place] upon returning to recliner however only required 3-4 minutes recovery with pursed lip breathing for SpO2 to improve back to 90%  Stairs            Wheelchair Mobility    Modified Rankin (Stroke Patients Only)       Balance Overall balance assessment: Needs assistance         Standing balance support: Bilateral upper extremity supported Standing balance-Leahy Scale: Poor Standing balance comment: requires UE support                             Pertinent Vitals/Pain Pain Assessment: No/denies pain    Home Living Family/patient expects to be discharged to:: Private residence Living Arrangements: Alone Available Help at Discharge: Family;Available 24 hours/day(daughter check on pt) Type of Home: House Home Access: Stairs to enter Entrance Stairs-Rails: Right Entrance Stairs-Number of Steps: 1 Home Layout: One level Home Equipment: Walker - 4 wheels;Cane - single point;Shower seat Additional Comments: 5 L home O2    Prior Function Level of Independence: Needs assistance   Gait / Transfers Assistance Needed: Uses a RW for functional mobility  ADL's / Homemaking Assistance Needed: Pt requiring assistance for dressing/bathing and family performs all IADLs        Hand Dominance        Extremity/Trunk Assessment  Lower Extremity Assessment Lower Extremity Assessment: Generalized weakness       Communication   Communication: No difficulties  Cognition Arousal/Alertness: Awake/alert Behavior During Therapy: WFL for tasks assessed/performed Overall Cognitive Status: Within Functional Limits for tasks assessed                                        General Comments      Exercises     Assessment/Plan    PT Assessment  Patient needs continued PT services  PT Problem List Decreased strength;Decreased mobility;Decreased activity tolerance;Cardiopulmonary status limiting activity;Decreased balance;Decreased knowledge of use of DME       PT Treatment Interventions DME instruction;Functional mobility training;Gait training;Therapeutic activities;Neuromuscular re-education;Balance training;Stair training;Therapeutic exercise;Patient/family education    PT Goals (Current goals can be found in the Care Plan section)  Acute Rehab PT Goals PT Goal Formulation: With patient Time For Goal Achievement: 11/27/18 Potential to Achieve Goals: Good    Frequency Min 3X/week   Barriers to discharge        Co-evaluation               AM-PAC PT "6 Clicks" Mobility  Outcome Measure Help needed turning from your back to your side while in a flat bed without using bedrails?: A Little Help needed moving from lying on your back to sitting on the side of a flat bed without using bedrails?: A Little Help needed moving to and from a bed to a chair (including a wheelchair)?: A Little Help needed standing up from a chair using your arms (e.g., wheelchair or bedside chair)?: A Little Help needed to walk in hospital room?: A Little Help needed climbing 3-5 steps with a railing? : A Lot 6 Click Score: 17    End of Session Equipment Utilized During Treatment: Gait belt;Oxygen Activity Tolerance: Patient tolerated treatment well Patient left: in chair;with family/visitor present;with call bell/phone within reach Nurse Communication: Mobility status PT Visit Diagnosis: Other abnormalities of gait and mobility (R26.89)    Time: 2549-8264 PT Time Calculation (min) (ACUTE ONLY): 20 min   Charges:   PT Evaluation $PT Eval Low Complexity: [place], PT, DPT Acute Rehabilitation Services Office: [phone] Pager: [phone]  Trena Platt 11/13/2018, 12:58 PM

## 2018-11-13 NOTE — Progress Notes (Addendum)
Patient ID: ELNER SEIFERT, female   DOB: [birth year], 85 y.o.   MRN: [id number]  This NP visited patient at the bedside as a follow up to  yesterday's [place].  Patient remains weak but alert and oriented.  Plan continues to be  treat the treatable, stabilized and then discharge home with hospice services.  If patient improves significantly could consider short term rehab at a [facility] is for more quality time at home.  Focus of care is comfort and dignity.  Patient and daughter understand the seriousness of current medical situation and long term poor prognosis.  Discussed  the importance of continued conversation with family and the  medical providers regarding overall plan of care and treatment options,  ensuring decisions are within the context of the patients values and GOCs.  Questions and concerns addressed   Discussed with Dr Wyline Copas This NP will not be in hospital until Monday--call team phone and Dr Domingo Cocking will be available for questions or concerns.    Total time spent on the unit was 15 minutes  Greater than 50% of the time was spent in counseling and coordination of care  Wadie Lessen NP  Palliative Medicine Team Team Phone # [phone] Pager [phone]

## 2018-11-13 NOTE — Progress Notes (Signed)
ANTICOAGULATION CONSULT NOTE - Follow Up Consult  Pharmacy Consult for Heparin  Indication: chest pain/ACS  No Known Allergies  Patient Measurements: Height: 4\' 11"  (149.9 cm) Weight: 143 lb 11.8 oz (65.2 kg) IBW/kg (Calculated) : 43.2 Heparin Dosing Weight: 58.3 kg  Vital Signs: Temp: 97.5 F (36.4 C) (01/16 0408) Temp Source: Oral (01/16 0408) BP: 94/61 (01/16 0408) Pulse Rate: 80 (01/16 0408)  Labs: Recent Labs    11/10/18 1816 11/10/18 1938 11/10/18 2341  11/11/18 0829 11/11/18 1731 11/12/18 0225 11/12/18 1222 11/13/18 0554  HGB 13.7  --   --   --  11.3* 11.3* 12.0  --  10.3*  HCT 44.1  --   --   --  36.8 35.9* 38.5  --  32.5*  PLT 232  --   --   --  187 185 239  --  187  APTT  --  24  --   --   --   --   --   --   --   LABPROT  --  14.0  --   --   --   --   --   --   --   INR  --  1.09  --   --   --   --   --   --   --   HEPARINUNFRC  --   --   --    < > 0.35 0.25* 0.40 0.34 0.41  CREATININE 1.59*  --   --   --   --  1.05* 1.02*  --   --   TROPONINI  --   --  1.67*  --  1.18* 0.88*  --   --   --    < > = values in this interval not displayed.    Estimated Creatinine Clearance: 31.3 mL/min (A) (by C-G formula based on SCr of 1.02 mg/dL (H)).   Medications:  Scheduled:  . aspirin EC  81 mg Oral QHS  . ezetimibe  10 mg Oral Daily  . feeding supplement (ENSURE ENLIVE)  237 mL Oral BID BM  . furosemide  40 mg Oral BID  . ipratropium-albuterol  3 mL Nebulization Q6H  . isosorbide mononitrate  30 mg Oral Daily  . methylPREDNISolone (SOLU-MEDROL) injection  40 mg Intravenous Q24H  . metoprolol succinate  12.5 mg Oral Daily  . mometasone-formoterol  1 puff Inhalation BID  . sertraline  50 mg Oral Daily  . simvastatin  40 mg Oral q1800   Infusions:  . heparin 850 Units/hr (11/12/18 1800)   PRN: acetaminophen **OR** acetaminophen, ALPRAZolam, ALPRAZolam, HYDROcodone-acetaminophen, ipratropium-albuterol, levalbuterol, morphine CONCENTRATE, nitroGLYCERIN    Assessment: 85 yo F with hx CAD presents with NSTEMI with plans for medical management.  No bolus, low dose drip per Cardiology.  Today, 11/12/2018:  Heparin level therapeutic (0.41) on 850 units/hr  CBC: Hgb decreased 10.3; Plt WNL  No bleeding reported  Goal of Therapy: Heparin level 0.3-0.5 units/ml Monitor platelets by anticoagulation protocol: Yes  Plan:  Continue heparin IV infusion at 850 units/hr  Daily CBC, daily heparin level  Monitor for signs of bleeding or thrombosis   Peggyann Juba, PharmD, BCPS Pager: [phone] 11/12/2018, 1:09 PM

## 2018-11-14 ENCOUNTER — Inpatient Hospital Stay (HOSPITAL_COMMUNITY): Payer: PPO

## 2018-11-14 ENCOUNTER — Other Ambulatory Visit: Payer: Self-pay | Admitting: Cardiology

## 2018-11-14 DIAGNOSIS — R0600 Dyspnea, unspecified: Secondary | ICD-10-CM

## 2018-11-14 LAB — COMPREHENSIVE METABOLIC PANEL
ALT: 27 U/L (ref 0–44)
AST: 33 U/L (ref 15–41)
Albumin: 3.1 g/dL — ABNORMAL LOW (ref 3.5–5.0)
Alkaline Phosphatase: 53 U/L (ref 38–126)
Anion gap: 10 (ref 5–15)
BUN: 25 mg/dL — ABNORMAL HIGH (ref 8–23)
CO2: 29 mmol/L (ref 22–32)
Calcium: 8.2 mg/dL — ABNORMAL LOW (ref 8.9–10.3)
Chloride: 104 mmol/L (ref 98–111)
Creatinine, Ser: 0.9 mg/dL (ref 0.44–1.00)
GFR calc Af Amer: >60 mL/min (ref >60)
GFR calc non Af Amer: 57 mL/min — ABNORMAL LOW (ref >60)
Glucose, Bld: 145 mg/dL — ABNORMAL HIGH (ref 70–99)
Potassium: 4.3 mmol/L (ref 3.5–5.1)
Sodium: 143 mmol/L (ref 135–145)
Total Bilirubin: 0.9 mg/dL (ref 0.3–1.2)
Total Protein: 5.5 g/dL — ABNORMAL LOW (ref 6.5–8.1)

## 2018-11-14 LAB — CULTURE, BLOOD (ROUTINE X 2): Special Requests: ADEQUATE

## 2018-11-14 LAB — CBC
HCT: 34.6 % — ABNORMAL LOW (ref 36.0–46.0)
Hemoglobin: 10.7 g/dL — ABNORMAL LOW (ref 12.0–15.0)
MCH: 31.8 pg (ref 26.0–34.0)
MCHC: 30.9 g/dL (ref 30.0–36.0)
MCV: 102.7 fL — AB (ref 80.0–100.0)
NRBC: 0 % (ref 0.0–0.2)
Platelets: 213 10*3/uL (ref 150–400)
RBC: 3.37 MIL/uL — AB (ref 3.87–5.11)
RDW: 15.9 % — ABNORMAL HIGH (ref 11.5–15.5)
WBC: 19.2 10*3/uL — ABNORMAL HIGH (ref 4.0–10.5)

## 2018-11-14 MED ORDER — SODIUM CHLORIDE 0.9 % IV BOLUS
250.0000 mL | Freq: Once | INTRAVENOUS | Status: AC
  Administered 2018-11-14: 250 mL via INTRAVENOUS

## 2018-11-14 MED ORDER — PREDNISONE 20 MG PO TABS
20.0000 mg | ORAL_TABLET | Freq: Two times a day (BID) | ORAL | Status: DC
  Administered 2018-11-14 – 2018-11-19 (×10): 20 mg via ORAL
  Filled 2018-11-14 (×12): qty 1

## 2018-11-14 MED ORDER — FUROSEMIDE 10 MG/ML IJ SOLN
40.0000 mg | Freq: Once | INTRAMUSCULAR | Status: AC
  Administered 2018-11-14: 40 mg via INTRAVENOUS
  Filled 2018-11-14: qty 4

## 2018-11-14 NOTE — Care Management Important Message (Signed)
Important Message  Patient Details  Name: Linda Kelly MRN: [id number] Date of Birth: 01/20/1928   Medicare Important Message Given:  Yes    [name] 11/14/2018, 9:34 AM

## 2018-11-14 NOTE — Progress Notes (Signed)
Occupational Therapy Treatment Patient Details Name: DELAILAH SPIETH MRN: [id number] DOB: 1928/10/01 Today's Date: 11/14/2018    History of present illness 85 y.o. female w/ a hx of pulmonary fibrosis on chronic home oxygen of 4-5 L, COPD, PVD, multivessel CAD, and combined systolic and diastolic heart failure (last EF 40-45%) and admitted to the hospital with working diagnosis of suspected sepsis complicated by type II MI/demand ischemia.    OT comments  Pts BP limiting pt this OT session  Follow Up Recommendations  Home health OT;Supervision/Assistance - 24 hour(depending on progress and care at home)    Equipment Recommendations  None recommended by OT    Recommendations for Other Services      Precautions / Restrictions Precautions Precautions: Fall Precaution Comments: chronic oxygen       Mobility Bed Mobility               General bed mobility comments: did not perform  Transfers                 General transfer comment: did not perform        ADL either performed or assessed with clinical judgement   ADL Overall ADL's : Needs assistance/impaired                                       General ADL Comments: Pt agreeable to OOB but OT took BP prior to sitting up and BP 68/33.  RN notified.  Encouraged fluids as well as BUE AROM. Pt left sitting in bed with HOB rraised     Vision Patient Visual Report: No change from baseline            Cognition Arousal/Alertness: Awake/alert Behavior During Therapy: WFL for tasks assessed/performed Overall Cognitive Status: Within Functional Limits for tasks assessed                                                     Pertinent Vitals/ Pain       Pain Assessment: No/denies pain         Frequency  Min 2X/week        Progress Toward Goals  OT Goals(current goals can now be found in the care plan section)  Progress towards OT goals: Not progressing toward goals  - comment(pts BP limiting pt this day)     Plan Discharge plan remains appropriate       AM-PAC OT "6 Clicks" Daily Activity     Outcome Measure   Help from another person eating meals?: None Help from another person taking care of personal grooming?: A Little Help from another person toileting, which includes using toliet, bedpan, or urinal?: A Little Help from another person bathing (including washing, rinsing, drying)?: A Little Help from another person to put on and taking off regular upper body clothing?: A Little Help from another person to put on and taking off regular lower body clothing?: A Lot 6 Click Score: 18    End of Session    OT Visit Diagnosis: Unsteadiness on feet (R26.81);Muscle weakness (generalized) (M62.81);Other abnormalities of gait and mobility (R26.89)   Activity Tolerance Patient limited by fatigue;Treatment limited secondary to medical complications (Comment)   Patient Left in bed;with call bell/phone within reach  Nurse Communication Mobility status        Time: 1130-1146 OT Time Calculation (min): 16 min  Charges: OT General Charges $OT Visit: 1 Visit OT Treatments $Self Care/Home Management : 8-22 mins  Kari Baars, [place] [phone] Office- [phone], Edwena Felty D 11/14/2018, 1:53 PM

## 2018-11-14 NOTE — Progress Notes (Signed)
PROGRESS NOTE    Linda Kelly  KDX:[id number] DOB: [birth year] DOA: 11/10/2018 PCP: Binnie Rail, MD    Brief Narrative:  85 year old female who presented with dyspnea. She does have significant past medical history for severe multivessel coronary artery disease not  for revascularization, peripheral vascular disease, chronic diastolic heart failure, pulmonary fibrosis with proximal respiratory failure, COPD, GERD, depression and history of CVA.  Reported gradual worsening of shortness of breath, refractive to have bronchodilators at home.  She will physical examination blood pressure 114/98, heart rate 93, respiratory rate 28, temperature 97.7, oxygen saturation 98% on supplemental oxygen, she had dry mucous membranes, lungs with wheezing and rales bilaterally, heart S1-S2 present rhythmic, abdomen soft nontender, no lower extremity edema.  Sodium 143, potassium 3.4, chloride 102, bicarb 24, glucose 213, BUN 23, creatinine 1.59, white count 13.4, hemoglobin 13.7, hematocrit 44.1, platelets 232, troponin 2.0.  Urine analysis was negative for infection.  CT of the abdomen pelvis with no acute process.  Chest x-ray with chronic fibrotic changes.  EKG with sinus rhythm, left axis deviation, prolonged QTC.  Patient was admitted to the hospital with working diagnosis of suspected sepsis complicated by type II MI/demand ischemia.    Assessment & Plan:   Active Problems:   Hyperlipidemia   Essential hypertension   CAD S/P percutaneous coronary angioplasty 1985   COPD mixed type (HCC)   GERD   Dyspnea   PVD (peripheral vascular disease) (HCC)   NSTEMI myocardial infarction (HCC)   Chronic combined systolic (congestive) and diastolic (congestive) heart failure (HCC)   Pulmonary fibrosis (HCC)   Chronic respiratory failure with hypoxia (HCC)   Demand ischemia (HCC)   Elevated troponin   Severe sepsis (HCC)   Prolonged QT interval   Sepsis ([place])   Respiratory failure ([place])   1. Sepsis  with unclear source of infection. Patient has remained hemodynamically stable, no source of infection, abx on hold for now, will continue to follow cell count, cultures and temperature curve. Continue telemetry monitoring on the medical ward.   2. Pulmonary fibrosis and COPD with acute on chronic hypoxic respiratory failure. Chest film with no new infiltrates that may suggest IPF flare. Pt had been continued on low dose methylprednisolone and bronchodilator therapy for presumed underlying COPD exacerbation.  Initial question of PE, thus VQ was ordered, however pt is unable to perform study given O2 requirements. Heparin drip was started empirically, transition to prophylactic anticoagulation -Appreciate input by Pulmonary. Recommendation to wean steroids as tolerated and avoid vol overload. Weaned to prednisone today per PCCM  3. MI type 2. No clinical signs of acute coronary syndrome, will continue close monitoring of telemetry. No chest pain or significant ekg changes. Patient is known to have advance CAD. Isosorbide stopped secondary to hypotension. On metoprolol with hold parameters. Continue simvastatin and PRN NTG.   4. Diastolic heart failure. Seems to be stable. Recent CXR reviewed personally. Findings suggestive of edema, however pt's bp did not tolerate lasix. Continued on PO diuretic as tolerated. Pulm rec to avoid vol overload. Will give fluid bolus secondary to hypotension this afternoon  5. Dementia. No agitation, continue neuro checks per unit protocol.   6. AKI, Rneal function with serum cr at 1,59 with K at 3,4 and serum bicarbonate at 24. Will repeat bmet in AM   DVT prophylaxis: Heparin gtt Code Status: DNR Family Communication: Pt in room Disposition Plan: Uncertain at this time  Consultants:   CCM  Palliative Care  Procedures:  Antimicrobials: Anti-infectives (From admission, onward)   Start     Dose/Rate Route Frequency Ordered Stop   11/12/18 1800   vancomycin (VANCOCIN) IVPB 750 mg/150 ml premix  Status:  Discontinued     750 mg 150 mL/hr over 60 Minutes Intravenous Every 48 hours 11/11/18 1423 11/11/18 1543   11/11/18 2200  ceFEPIme (MAXIPIME) 1 g in sodium chloride 0.9 % 100 mL IVPB  Status:  Discontinued     1 g 200 mL/hr over 30 Minutes Intravenous Every 24 hours 11/11/18 1423 11/11/18 1543   11/10/18 1945  metroNIDAZOLE (FLAGYL) IVPB 500 mg  Status:  Discontinued     500 mg 100 mL/hr over 60 Minutes Intravenous Every 8 hours 11/10/18 1939 11/11/18 1543   11/10/18 1945  ceFEPIme (MAXIPIME) 2 g in sodium chloride 0.9 % 100 mL IVPB     2 g 200 mL/hr over 30 Minutes Intravenous  Once 11/10/18 1943 11/10/18 2125   11/10/18 1945  vancomycin (VANCOCIN) 1,500 mg in sodium chloride 0.9 % 500 mL IVPB     1,500 mg 250 mL/hr over 120 Minutes Intravenous  Once 11/10/18 1943 11/10/18 2358      Subjective: Denies chest pain  Objective: Vitals:   11/14/18 0552 11/14/18 1004 11/14/18 1053 11/14/18 1351  BP: 103/80  (!) 74/55 (!) 68/33  Pulse: 82  96   Resp: (!) 24  (!) 22   Temp: 97.6 F (36.4 C)  (!) 97.5 F (36.4 C)   TempSrc: Oral  Axillary   SpO2: 96% 94% (!) 89%   Weight:      Height:        Intake/Output Summary (Last 24 hours) at 11/14/2018 1442 Last data filed at 11/14/2018 0830 Gross per 24 hour  Intake 720 ml  Output 950 ml  Net -230 ml   Filed Weights   11/10/18 1756 11/11/18 1700 11/13/18 0411  Weight: 64 kg 68.3 kg 65.2 kg    Examination: General exam: Conversant, in no acute distress Respiratory system: normal chest rise, clear, no audible wheezing Cardiovascular system: regular rhythm, s1-s2 Gastrointestinal system: Nondistended, nontender, pos BS Central nervous system: No seizures, no tremors Extremities: No cyanosis, no joint deformities Skin: No rashes, no pallor Psychiatry: Affect normal // no auditory hallucinations   Data Reviewed: I have personally reviewed following labs and imaging  studies  CBC: Recent Labs  Lab 11/10/18 1816 11/11/18 0829 11/11/18 1731 11/12/18 0225 11/13/18 0554 11/14/18 0515  WBC 13.4* 13.3* 23.8* 28.4* 20.6* 19.2*  NEUTROABS 11.2*  --   --   --   --   --   HGB 13.7 11.3* 11.3* 12.0 10.3* 10.7*  HCT 44.1 36.8 35.9* 38.5 32.5* 34.6*  MCV 104.5* 101.9* 103.5* 104.1* 103.2* 102.7*  PLT 232 187 185 239 187 485   Basic Metabolic Panel: Recent Labs  Lab 11/10/18 1816 11/11/18 1731 11/12/18 0225 11/14/18 0515  NA 143 142  --  143  K 3.4* 4.4  --  4.3  CL 102 113*  --  104  CO2 24 22  --  29  GLUCOSE 203* 107*  --  145*  BUN 23 25*  --  25*  CREATININE 1.59* 1.05* 1.02* 0.90  CALCIUM 8.2* 7.7*  --  8.2*  MG  --  1.9  --   --   PHOS  --  3.4  --   --    GFR: Estimated Creatinine Clearance: 35.5 mL/min (by C-G formula based on SCr of 0.9 mg/dL). Liver Function Tests:  Recent Labs  Lab 11/10/18 1816 11/11/18 1731 11/14/18 0515  AST 31 25 33  ALT 17 16 27   ALKPHOS 58 48 53  BILITOT 1.2 0.7 0.9  PROT 6.6 5.7* 5.5*  ALBUMIN 3.7 3.0* 3.1*   No results for input(s): LIPASE, AMYLASE in the last 168 hours. No results for input(s): AMMONIA in the last 168 hours. Coagulation Profile: Recent Labs  Lab 11/10/18 1938  INR 1.09   Cardiac Enzymes: Recent Labs  Lab 11/10/18 2341 11/11/18 0829 11/11/18 1731  TROPONINI 1.67* 1.18* 0.88*   BNP (last 3 results) Recent Labs    07/23/18 1111  PROBNP 162.0*   HbA1C: No results for input(s): HGBA1C in the last 72 hours. CBG: No results for input(s): GLUCAP in the last 168 hours. Lipid Profile: No results for input(s): CHOL, HDL, LDLCALC, TRIG, CHOLHDL, LDLDIRECT in the last 72 hours. Thyroid Function Tests: Recent Labs    11/11/18 1731  TSH 0.345*   Anemia Panel: No results for input(s): VITAMINB12, FOLATE, FERRITIN, TIBC, IRON, RETICCTPCT in the last 72 hours. Sepsis Labs: Recent Labs  Lab 11/10/18 1932 11/10/18 2121 11/11/18 1731 11/11/18 1950  PROCALCITON  --    --  <0.10  --   LATICACIDVEN 6.42* 7.25* 2.7* 3.2*    Recent Results (from the past 240 hour(s))  Culture, blood (routine x 2)     Status: None (Preliminary result)   Collection Time: 11/10/18  7:26 PM  Result Value Ref Range Status   Specimen Description   Final    RIGHT ANTECUBITAL Performed at [facility], [place] [address] [place], [place] [address]    Special Requests   Final    BOTTLES DRAWN AEROBIC AND ANAEROBIC Blood Culture adequate volume Performed at [place] [address], [place], [place] [address]    Culture   Final    NO GROWTH 4 DAYS Performed at [facility], [place] [address] [place], [place] [address]    Report Status PENDING  Incomplete  Culture, blood (routine x 2)     Status: Abnormal   Collection Time: 11/10/18  7:31 PM  Result Value Ref Range Status   Specimen Description   Final    LEFT ANTECUBITAL Performed at [place] [address], [place], [place] [address]    Special Requests   Final    BOTTLES DRAWN AEROBIC AND ANAEROBIC Blood Culture adequate volume Performed at [place] [address] [place], [place] [address]    Culture  Setup Time   Final    GRAM POSITIVE COCCI IN CHAINS ANAEROBIC BOTTLE ONLY CRITICAL RESULT CALLED TO, READ BACK BY AND VERIFIED WITH: Elenore Paddy PharmD 16:50 11/11/18 ([email or web address])    Culture (A)  Final    VIRIDANS STREPTOCOCCUS THE SIGNIFICANCE OF ISOLATING THIS ORGANISM FROM A SINGLE SET OF BLOOD CULTURES WHEN MULTIPLE SETS ARE DRAWN IS UNCERTAIN. PLEASE NOTIFY THE MICROBIOLOGY DEPARTMENT WITHIN ONE WEEK IF SPECIATION AND SENSITIVITIES ARE REQUIRED. Performed at [facility], [place] [address] [place], [place] [address]    Report Status 11/13/2018 FINAL  Final  Blood Culture ID Panel (Reflexed)     Status: Abnormal   Collection Time: 11/10/18  7:31 PM  Result Value Ref Range Status   Enterococcus species NOT DETECTED NOT  DETECTED Final   Listeria monocytogenes NOT DETECTED NOT DETECTED Final   Staphylococcus species NOT DETECTED NOT DETECTED Final   Staphylococcus aureus (BCID) NOT DETECTED NOT DETECTED Final  Streptococcus species DETECTED (A) NOT DETECTED Final    Comment: Not Enterococcus species, Streptococcus agalactiae, Streptococcus pyogenes, or Streptococcus pneumoniae. CRITICAL RESULT CALLED TO, READ BACK BY AND VERIFIED WITH: Elenore Paddy PharmD 16:50 11/11/18 ([email or web address])    Streptococcus agalactiae NOT DETECTED NOT DETECTED Final   Streptococcus pneumoniae NOT DETECTED NOT DETECTED Final   Streptococcus pyogenes NOT DETECTED NOT DETECTED Final   Acinetobacter baumannii NOT DETECTED NOT DETECTED Final   Enterobacteriaceae species NOT DETECTED NOT DETECTED Final   Enterobacter cloacae complex NOT DETECTED NOT DETECTED Final   Escherichia coli NOT DETECTED NOT DETECTED Final   Klebsiella oxytoca NOT DETECTED NOT DETECTED Final   Klebsiella pneumoniae NOT DETECTED NOT DETECTED Final   Proteus species NOT DETECTED NOT DETECTED Final   Serratia marcescens NOT DETECTED NOT DETECTED Final   Haemophilus influenzae NOT DETECTED NOT DETECTED Final   Neisseria meningitidis NOT DETECTED NOT DETECTED Final   Pseudomonas aeruginosa NOT DETECTED NOT DETECTED Final   Candida albicans NOT DETECTED NOT DETECTED Final   Candida glabrata NOT DETECTED NOT DETECTED Final   Candida krusei NOT DETECTED NOT DETECTED Final   Candida parapsilosis NOT DETECTED NOT DETECTED Final   Candida tropicalis NOT DETECTED NOT DETECTED Final    Comment: Performed at [facility], [place] [address], [place], [place] [address]  Urine Culture     Status: Abnormal   Collection Time: 11/10/18 10:38 PM  Result Value Ref Range Status   Specimen Description   Final    URINE, CLEAN CATCH Performed at [facility], [place] [address], [place], [place] [address]    Special Requests   Final    NONE Performed at [facility], [place] [address] [place], [place] [address]    Culture (A)  Final    <10,000 COLONIES/mL INSIGNIFICANT GROWTH Performed at [place] [address], [place], [place] [address]    Report Status 11/12/2018 FINAL  Final  Respiratory Panel by PCR     Status: None   Collection Time: 11/10/18 11:15 PM  Result Value Ref Range Status   Adenovirus NOT DETECTED NOT DETECTED Final   Coronavirus 229E NOT DETECTED NOT DETECTED Final   Coronavirus HKU1 NOT DETECTED NOT DETECTED Final   Coronavirus NL63 NOT DETECTED NOT DETECTED Final   Coronavirus OC43 NOT DETECTED NOT DETECTED Final   Metapneumovirus NOT DETECTED NOT DETECTED Final   Rhinovirus / Enterovirus NOT DETECTED NOT DETECTED Final   Influenza A NOT DETECTED NOT DETECTED Final   Influenza B NOT DETECTED NOT DETECTED Final   Parainfluenza Virus 1 NOT DETECTED NOT DETECTED Final   Parainfluenza Virus 2 NOT DETECTED NOT DETECTED Final   Parainfluenza Virus 3 NOT DETECTED NOT DETECTED Final   Parainfluenza Virus 4 NOT DETECTED NOT DETECTED Final   Respiratory Syncytial Virus NOT DETECTED NOT DETECTED Final   Bordetella pertussis NOT DETECTED NOT DETECTED Final   Chlamydophila pneumoniae NOT DETECTED NOT DETECTED Final   Mycoplasma pneumoniae NOT DETECTED NOT DETECTED Final    Comment: Performed at [facility], [place] [address], [place], [place] [address]     Radiology Studies: No results found.  Scheduled Meds: . aspirin EC  81 mg Oral QHS  . ezetimibe  10 mg Oral Daily  . feeding supplement (ENSURE ENLIVE)  237 mL Oral BID BM  . heparin injection (subcutaneous)  5,000 Units Subcutaneous Q8H  . ipratropium-albuterol  3 mL Nebulization BID  . isosorbide mononitrate  30 mg  Oral Daily  . metoprolol succinate  12.5 mg Oral Daily  . mometasone-formoterol  1 puff Inhalation BID  . predniSONE  20 mg Oral BID WC  . sertraline  50 mg Oral Daily  . simvastatin  40 mg Oral q1800   Continuous  Infusions:    LOS: 4 days   Marylu Lund, MD [facility] Pager On Amion  If 7PM-7AM, please contact night-coverage 11/14/2018, 2:42 PM

## 2018-11-14 NOTE — Consult Note (Signed)
   NAME:  Linda Kelly, MRN:  [id number], DOB:  1928-04-18, LOS: 4 ADMISSION DATE:  11/10/2018, CONSULTATION DATE:  11/12/18 REFERRING MD:  Dr. Wyline Copas, CHIEF COMPLAINT:  SOB, hypoxia    Brief History   85 y/o F with UIP / ILD, 5L O2 dependent admitted 1/13 with reports of weakness, SOB.  Found to have worsening hypoxia.  PCCM consulted for evaluation of hypoxic respiratory failure? Septic but turns out pt may have inadvertently stopped her prednisone which was intended as maint rx.      Significant Hospital Events   1/13  Admit   Consults:  PCCM 1/15  Procedures:    Significant Diagnostic Tests:  CT chest 01/04/15- the pattern is most compatible with usual interstitial pneumonia (UIP) CT chest 11/28/2016- Continued progression of fibrotic interstitial lung disease CXR 05/15/2018- Cardiomegaly.Chronic interstitial lung disease. Slight increase in interstitial prominence suggesting the possibility of the presence of a mild component of CHF or pneumonitis  ECHO 1/15 >> LVEF 55-60%, poor windows, grade 1 diastolic dysfunction, mild to mod MR, pa peak pressure 42  Micro Data:  RVP 1/13 >> negative   Antimicrobials:  Cefepime 1/13 >> 1/13 Vancomycin 1/13 >> 1/13  Interim history/subjective:   back to poor baseline = across the room sob on 5lpm   Objective   Blood pressure 103/80, pulse 82, temperature 97.6 F (36.4 C), temperature source Oral, resp. rate (!) 24, height 4\' 11"  (1.499 m), weight 65.2 kg, SpO2 96 %.        Intake/Output Summary (Last 24 hours) at 11/14/2018 5732 Last data filed at 11/14/2018 0500 Gross per 24 hour  Intake -  Output 1250 ml  Net -1250 ml   Filed Weights   11/10/18 1756 11/11/18 1700 11/13/18 0411  Weight: 64 kg 68.3 kg 65.2 kg    Examination:  Pt alert, approp nad @ flat in bed  No jvd Oropharynx clear,  mucosa nl Neck supple Lungs with a insp > exp rhonchi with crackles RRR no s3 or or sign murmur Abd obese with nlexcursion  Extr warm  with no edema or clubbing noted Neuro  Sensorium intact ,  no apparent motor deficits     Resolved Hospital Problem list      Assessment & Plan:   Acute on Chronic Hypoxic Respiratory Failure  -baseline 4-5L O2 dependent  -likely multifactorial in the setting , baseline hypoxia, ILD, immobility/kyphosis with atelectasis (noted on exam that O2 increased with deep breath) P: Wean O2 for sats > 88-95% PT efforts / mobilize  IS added 1/17       ILD/UIP  -[place] dust exposure  -slow progression of ILD, pt opted not to treat, followed by Dr. Annamaria Boots She may have been more steroid responsive/ dependent than previously appreciated if in fact her acute weakness/sob occurred sev days after stopping prednisone so would be sure she doesn't stop prednisone  completely s checking with Dr Annamaria Boots.    Pulmonary f/u as inpt with be prn - call if needed    Christinia Gully, MD Pulmonary and [place] [phone] After 5:30 PM or weekends, use Beeper [phone]

## 2018-11-14 NOTE — Progress Notes (Signed)
PHARMACY - PHYSICIAN COMMUNICATION CRITICAL VALUE ALERT - BLOOD CULTURE IDENTIFICATION (BCID)  Linda Kelly is an 85 y.o. female who presented to [facility] on 11/10/2018 with a chief complaint of dyspnea.  Assessment: clinically insignificant  Name of physician (or Provider) Contacted: Wyline Copas  Current antibiotics: none  Changes to prescribed antibiotics recommended:  none  Results for orders placed or performed during the hospital encounter of 11/10/18  Blood Culture ID Panel (Reflexed) (Collected: 11/10/2018  7:31 PM)  Result Value Ref Range   Enterococcus species NOT DETECTED NOT DETECTED   Listeria monocytogenes NOT DETECTED NOT DETECTED   Staphylococcus species NOT DETECTED NOT DETECTED   Staphylococcus aureus (BCID) NOT DETECTED NOT DETECTED   Streptococcus species DETECTED (A) NOT DETECTED   Streptococcus agalactiae NOT DETECTED NOT DETECTED   Streptococcus pneumoniae NOT DETECTED NOT DETECTED   Streptococcus pyogenes NOT DETECTED NOT DETECTED   Acinetobacter baumannii NOT DETECTED NOT DETECTED   Enterobacteriaceae species NOT DETECTED NOT DETECTED   Enterobacter cloacae complex NOT DETECTED NOT DETECTED   Escherichia coli NOT DETECTED NOT DETECTED   Klebsiella oxytoca NOT DETECTED NOT DETECTED   Klebsiella pneumoniae NOT DETECTED NOT DETECTED   Proteus species NOT DETECTED NOT DETECTED   Serratia marcescens NOT DETECTED NOT DETECTED   Haemophilus influenzae NOT DETECTED NOT DETECTED   Neisseria meningitidis NOT DETECTED NOT DETECTED   Pseudomonas aeruginosa NOT DETECTED NOT DETECTED   Candida albicans NOT DETECTED NOT DETECTED   Candida glabrata NOT DETECTED NOT DETECTED   Candida krusei NOT DETECTED NOT DETECTED   Candida parapsilosis NOT DETECTED NOT DETECTED   Candida tropicalis NOT DETECTED NOT DETECTED    Dolly Rias RPh 11/14/2018, 6:24 PM Pager [phone]

## 2018-11-15 LAB — CULTURE, BLOOD (ROUTINE X 2)
Culture: NO GROWTH
Special Requests: ADEQUATE

## 2018-11-15 LAB — BASIC METABOLIC PANEL
Anion gap: 12 (ref 5–15)
BUN: 31 mg/dL — AB (ref 8–23)
CO2: 32 mmol/L (ref 22–32)
Calcium: 8.4 mg/dL — ABNORMAL LOW (ref 8.9–10.3)
Chloride: 100 mmol/L (ref 98–111)
Creatinine, Ser: 1.05 mg/dL — ABNORMAL HIGH (ref 0.44–1.00)
GFR calc Af Amer: 55 mL/min — ABNORMAL LOW (ref >60)
GFR calc non Af Amer: 47 mL/min — ABNORMAL LOW (ref >60)
Glucose, Bld: 148 mg/dL — ABNORMAL HIGH (ref 70–99)
Potassium: 3.5 mmol/L (ref 3.5–5.1)
Sodium: 144 mmol/L (ref 135–145)

## 2018-11-15 MED ORDER — LORAZEPAM 2 MG/ML IJ SOLN: 0.5000 mg | Freq: Once | INTRAMUSCULAR | Status: DC

## 2018-11-15 MED ORDER — BENZONATATE 100 MG PO CAPS
100.0000 mg | ORAL_CAPSULE | Freq: Three times a day (TID) | ORAL | Status: DC | PRN
  Administered 2018-11-15 – 2018-11-18 (×3): 100 mg via ORAL
  Filled 2018-11-15 (×3): qty 1

## 2018-11-15 MED ORDER — DEXTROSE 50 % IV SOLN: 25.0000 mL | Freq: Once | INTRAVENOUS | Status: DC

## 2018-11-15 MED ORDER — LORAZEPAM 2 MG/ML IJ SOLN
0.5000 mg | Freq: Once | INTRAMUSCULAR | Status: AC
  Administered 2018-11-15: 0.5 mg via INTRAVENOUS
  Filled 2018-11-15: qty 1

## 2018-11-15 NOTE — Progress Notes (Signed)
PROGRESS NOTE    Linda Kelly  DEY:[id number] DOB: 26-Mar-1928 DOA: 11/10/2018 PCP: Binnie Rail, MD    Brief Narrative:  85 year old female who presented with dyspnea. She does have significant past medical history for severe multivessel coronary artery disease not  for revascularization, peripheral vascular disease, chronic diastolic heart failure, pulmonary fibrosis with proximal respiratory failure, COPD, GERD, depression and history of CVA.  Reported gradual worsening of shortness of breath, refractive to have bronchodilators at home.  She will physical examination blood pressure 114/98, heart rate 93, respiratory rate 28, temperature 97.7, oxygen saturation 98% on supplemental oxygen, she had dry mucous membranes, lungs with wheezing and rales bilaterally, heart S1-S2 present rhythmic, abdomen soft nontender, no lower extremity edema.  Sodium 143, potassium 3.4, chloride 102, bicarb 24, glucose 213, BUN 23, creatinine 1.59, white count 13.4, hemoglobin 13.7, hematocrit 44.1, platelets 232, troponin 2.0.  Urine analysis was negative for infection.  CT of the abdomen pelvis with no acute process.  Chest x-ray with chronic fibrotic changes.  EKG with sinus rhythm, left axis deviation, prolonged QTC.  Patient was admitted to the hospital with working diagnosis of suspected sepsis complicated by type II MI/demand ischemia.    Assessment & Plan:   Active Problems:   Hyperlipidemia   Essential hypertension   CAD S/P percutaneous coronary angioplasty 1985   COPD mixed type (HCC)   GERD   Dyspnea   PVD (peripheral vascular disease) (HCC)   NSTEMI myocardial infarction (HCC)   Chronic combined systolic (congestive) and diastolic (congestive) heart failure (HCC)   Pulmonary fibrosis (HCC)   Chronic respiratory failure with hypoxia (HCC)   Demand ischemia (HCC)   Elevated troponin   Severe sepsis (HCC)   Prolonged QT interval   Sepsis ([place])   Respiratory failure ([place])   1. Sepsis  with unclear source of infection. Patient has remained hemodynamically stable, no source of infection, abx on hold for now. Presently stable  2. Pulmonary fibrosis and COPD with acute on chronic hypoxic respiratory failure. Chest film with no new infiltrates that may suggest IPF flare. Pt had been continued on low dose methylprednisolone and bronchodilator therapy for presumed underlying COPD exacerbation.  Initial question of PE, thus VQ was ordered, however pt is unable to perform study given O2 requirements. Heparin drip was started empirically, transition to prophylactic anticoagulation -Appreciate input by Pulmonary. Recommendation to wean steroids as tolerated and avoid vol overload. Weaned to prednisone today per PCCM. No wheezing on exam.  -this AM, pt with increased O2 requirements and labored breathing. Given one dose of IV ativan with improvement. Continue with anxiolytics as tolerated  3. MI type 2. No clinical signs of acute coronary syndrome, will continue close monitoring of telemetry. Patient is known to have advance CAD. Isosorbide stopped secondary to hypotension. Continued on metoprolol with hold parameters. No chest pain this AM  4. Diastolic heart failure. Seems to be stable. Recent CXR reviewed personally. Findings suggestive of edema, however pt's bp did not tolerate lasix. Continued on PO diuretic as tolerated. Pulm rec to avoid vol overload. Recently given gentle fluid bolus for overload, required lasix overnight. Avoid overhydration  5. Dementia. No agitation, continue neuro checks per unit protocol.   6. AKI, Renal function remains baseline labs reviewed   DVT prophylaxis: Heparin subq Code Status: DNR Family Communication: Pt in room Disposition Plan: Uncertain at this time  Consultants:   CCM  Palliative Care  Procedures:     Antimicrobials: Anti-infectives (From admission, onward)  Start     Dose/Rate Route Frequency Ordered Stop   11/12/18 1800   vancomycin (VANCOCIN) IVPB 750 mg/150 ml premix  Status:  Discontinued     750 mg 150 mL/hr over 60 Minutes Intravenous Every 48 hours 11/11/18 1423 11/11/18 1543   11/11/18 2200  ceFEPIme (MAXIPIME) 1 g in sodium chloride 0.9 % 100 mL IVPB  Status:  Discontinued     1 g 200 mL/hr over 30 Minutes Intravenous Every 24 hours 11/11/18 1423 11/11/18 1543   11/10/18 1945  metroNIDAZOLE (FLAGYL) IVPB 500 mg  Status:  Discontinued     500 mg 100 mL/hr over 60 Minutes Intravenous Every 8 hours 11/10/18 1939 11/11/18 1543   11/10/18 1945  ceFEPIme (MAXIPIME) 2 g in sodium chloride 0.9 % 100 mL IVPB     2 g 200 mL/hr over 30 Minutes Intravenous  Once 11/10/18 1943 11/10/18 2125   11/10/18 1945  vancomycin (VANCOCIN) 1,500 mg in sodium chloride 0.9 % 500 mL IVPB     1,500 mg 250 mL/hr over 120 Minutes Intravenous  Once 11/10/18 1943 11/10/18 2358      Subjective: More sob this AM and with labored breathing.   Objective: Vitals:   11/15/18 1011 11/15/18 1028 11/15/18 1031 11/15/18 1326  BP:  (!) 65/39 (!) 84/62 115/65  Pulse:  (!) 101  (!) 104  Resp:  (!) 24  (!) 24  Temp:    97.8 F (36.6 C)  TempSrc:    Oral  SpO2: 96% (!) 78%  91%  Weight:      Height:        Intake/Output Summary (Last 24 hours) at 11/15/2018 1736 Last data filed at 11/15/2018 0500 Gross per 24 hour  Intake 830.03 ml  Output 2650 ml  Net -1819.97 ml   Filed Weights   11/11/18 1700 11/13/18 0411 11/15/18 0434  Weight: 68.3 kg 65.2 kg 68.6 kg    Examination: General exam: Awake, laying in bed, appears anxious Respiratory system: respiratory effort improved after receiving anxiolytic, no wheezing Cardiovascular system: regular rate, s1, s2 Gastrointestinal system: Soft, nondistended, positive BS Central nervous system: CN2-12 grossly intact, strength intact Extremities: Perfused, no clubbing Skin: Normal skin turgor, no notable skin lesions seen Psychiatry: Mood normal // no visual hallucinations   Data  Reviewed: I have personally reviewed following labs and imaging studies  CBC: Recent Labs  Lab 11/10/18 1816 11/11/18 0829 11/11/18 1731 11/12/18 0225 11/13/18 0554 11/14/18 0515  WBC 13.4* 13.3* 23.8* 28.4* 20.6* 19.2*  NEUTROABS 11.2*  --   --   --   --   --   HGB 13.7 11.3* 11.3* 12.0 10.3* 10.7*  HCT 44.1 36.8 35.9* 38.5 32.5* 34.6*  MCV 104.5* 101.9* 103.5* 104.1* 103.2* 102.7*  PLT 232 187 185 239 187 297   Basic Metabolic Panel: Recent Labs  Lab 11/10/18 1816 11/11/18 1731 11/12/18 0225 11/14/18 0515 11/15/18 0529  NA 143 142  --  143 144  K 3.4* 4.4  --  4.3 3.5  CL 102 113*  --  104 100  CO2 24 22  --  29 32  GLUCOSE 203* 107*  --  145* 148*  BUN 23 25*  --  25* 31*  CREATININE 1.59* 1.05* 1.02* 0.90 1.05*  CALCIUM 8.2* 7.7*  --  8.2* 8.4*  MG  --  1.9  --   --   --   PHOS  --  3.4  --   --   --  GFR: Estimated Creatinine Clearance: 31.2 mL/min (A) (by C-G formula based on SCr of 1.05 mg/dL (H)). Liver Function Tests: Recent Labs  Lab 11/10/18 1816 11/11/18 1731 11/14/18 0515  AST 31 25 33  ALT 17 16 27   ALKPHOS 58 48 53  BILITOT 1.2 0.7 0.9  PROT 6.6 5.7* 5.5*  ALBUMIN 3.7 3.0* 3.1*   No results for input(s): LIPASE, AMYLASE in the last 168 hours. No results for input(s): AMMONIA in the last 168 hours. Coagulation Profile: Recent Labs  Lab 11/10/18 1938  INR 1.09   Cardiac Enzymes: Recent Labs  Lab 11/10/18 2341 11/11/18 0829 11/11/18 1731  TROPONINI 1.67* 1.18* 0.88*   BNP (last 3 results) Recent Labs    07/23/18 1111  PROBNP 162.0*   HbA1C: No results for input(s): HGBA1C in the last 72 hours. CBG: No results for input(s): GLUCAP in the last 168 hours. Lipid Profile: No results for input(s): CHOL, HDL, LDLCALC, TRIG, CHOLHDL, LDLDIRECT in the last 72 hours. Thyroid Function Tests: No results for input(s): TSH, T4TOTAL, FREET4, T3FREE, THYROIDAB in the last 72 hours. Anemia Panel: No results for input(s): VITAMINB12,  FOLATE, FERRITIN, TIBC, IRON, RETICCTPCT in the last 72 hours. Sepsis Labs: Recent Labs  Lab 11/10/18 1932 11/10/18 2121 11/11/18 1731 11/11/18 1950  PROCALCITON  --   --  <0.10  --   LATICACIDVEN 6.42* 7.25* 2.7* 3.2*    Recent Results (from the past 240 hour(s))  Culture, blood (routine x 2)     Status: None   Collection Time: 11/10/18  7:26 PM  Result Value Ref Range Status   Specimen Description   Final    RIGHT ANTECUBITAL Performed at [facility], [place] [address], [place], [place] [address]    Special Requests   Final    BOTTLES DRAWN AEROBIC AND ANAEROBIC Blood Culture adequate volume Performed at [place] [address] [place], [place] [address]    Culture   Final    NO GROWTH 5 DAYS Performed at [facility], [place] [address] [place], [place] [address]    Report Status 11/15/2018 FINAL  Final  Culture, blood (routine x 2)     Status: Abnormal   Collection Time: 11/10/18  7:31 PM  Result Value Ref Range Status   Specimen Description   Final    LEFT ANTECUBITAL Performed at [place] [address] [place], [place] [address]    Special Requests   Final    BOTTLES DRAWN AEROBIC AND ANAEROBIC Blood Culture adequate volume Performed at [place] [address] [place], [place] [address]    Culture  Setup Time   Final    GRAM POSITIVE COCCI IN CHAINS ANAEROBIC BOTTLE ONLY CRITICAL RESULT CALLED TO, READ BACK BY AND VERIFIED WITH: Elenore Paddy PharmD 16:50 11/11/18 ([email or web address]) [place] CRITICAL RESULT CALLED TO, READ BACK BY AND VERIFIED WITH: Seleta Rhymes PharmD 17:35 11/14/18 ([email or web address])    Culture (A)  Final    VIRIDANS STREPTOCOCCUS THE SIGNIFICANCE OF ISOLATING THIS ORGANISM FROM A SINGLE SET OF BLOOD CULTURES WHEN MULTIPLE SETS ARE DRAWN IS UNCERTAIN. PLEASE NOTIFY THE MICROBIOLOGY DEPARTMENT WITHIN ONE WEEK IF SPECIATION AND SENSITIVITIES  ARE REQUIRED. Performed at [facility], [place] [address], [place], [place] [address]    Report Status 11/13/2018 FINAL  Final  Blood Culture ID Panel (Reflexed)     Status: Abnormal   Collection Time: 11/10/18  7:31 PM  Result Value Ref Range Status   Enterococcus species NOT DETECTED NOT DETECTED Final   Listeria monocytogenes NOT DETECTED NOT DETECTED Final   Staphylococcus species NOT DETECTED NOT DETECTED Final   Staphylococcus aureus (BCID) NOT DETECTED NOT DETECTED Final   Streptococcus species DETECTED (A) NOT DETECTED Final    Comment: Not Enterococcus species, Streptococcus agalactiae, Streptococcus pyogenes, or Streptococcus pneumoniae. CRITICAL RESULT CALLED TO, READ BACK BY AND VERIFIED WITH: Elenore Paddy PharmD 16:50 11/11/18 ([email or web address])    Streptococcus agalactiae NOT DETECTED NOT DETECTED Final   Streptococcus pneumoniae NOT DETECTED NOT DETECTED Final   Streptococcus pyogenes NOT DETECTED NOT DETECTED Final   Acinetobacter baumannii NOT DETECTED NOT DETECTED Final   Enterobacteriaceae species NOT DETECTED NOT DETECTED Final   Enterobacter cloacae complex NOT DETECTED NOT DETECTED Final   Escherichia coli NOT DETECTED NOT DETECTED Final   Klebsiella oxytoca NOT DETECTED NOT DETECTED Final   Klebsiella pneumoniae NOT DETECTED NOT DETECTED Final   Proteus species NOT DETECTED NOT DETECTED Final   Serratia marcescens NOT DETECTED NOT DETECTED Final   Haemophilus influenzae NOT DETECTED NOT DETECTED Final   Neisseria meningitidis NOT DETECTED NOT DETECTED Final   Pseudomonas aeruginosa NOT DETECTED NOT DETECTED Final   Candida albicans NOT DETECTED NOT DETECTED Final   Candida glabrata NOT DETECTED NOT DETECTED Final   Candida krusei NOT DETECTED NOT DETECTED Final   Candida parapsilosis NOT DETECTED NOT DETECTED Final   Candida tropicalis NOT DETECTED NOT DETECTED Final    Comment: Performed at [facility], [place] [address] [place], [place] [address]  Urine  Culture     Status: Abnormal   Collection Time: 11/10/18 10:38 PM  Result Value Ref Range Status   Specimen Description   Final    URINE, CLEAN CATCH Performed at [facility], [place] [address], [place], [place] [address]    Special Requests   Final    NONE Performed at [facility], [place] [address] [place], [place] [address]    Culture (A)  Final    <10,000 COLONIES/mL INSIGNIFICANT GROWTH Performed at [place] [name], [place], [place] [address]    Report Status 11/12/2018 FINAL  Final  Respiratory Panel by PCR     Status: None   Collection Time: 11/10/18 11:15 PM  Result Value Ref Range Status   Adenovirus NOT DETECTED NOT DETECTED Final   Coronavirus 229E NOT DETECTED NOT DETECTED Final   Coronavirus HKU1 NOT DETECTED NOT DETECTED Final   Coronavirus NL63 NOT DETECTED NOT DETECTED Final   Coronavirus OC43 NOT DETECTED NOT DETECTED Final   Metapneumovirus NOT DETECTED NOT DETECTED Final   Rhinovirus / Enterovirus NOT DETECTED NOT DETECTED Final   Influenza A NOT DETECTED NOT DETECTED Final   Influenza B NOT DETECTED NOT DETECTED Final   Parainfluenza Virus 1 NOT DETECTED NOT DETECTED Final   Parainfluenza Virus 2 NOT DETECTED NOT DETECTED Final   Parainfluenza Virus 3 NOT DETECTED NOT DETECTED Final   Parainfluenza Virus 4 NOT DETECTED NOT DETECTED Final   Respiratory Syncytial Virus NOT DETECTED NOT DETECTED Final   Bordetella pertussis NOT DETECTED NOT DETECTED Final   Chlamydophila pneumoniae NOT DETECTED NOT DETECTED Final   Mycoplasma pneumoniae NOT DETECTED NOT DETECTED Final    Comment: Performed at [facility], [place] [address], [place], [place] [address]     Radiology Studies: Dg Chest [place] 1 View  Result Date: 11/14/2018 CLINICAL DATA:  85 year old female with  shortness of breath. EXAM: PORTABLE CHEST 1 VIEW COMPARISON:  11/11/2018 and earlier. FINDINGS: Portable AP semi upright view at 2058 hours.  Continued diffuse reticulonodular bilateral pulmonary opacity, improved since 11/11/2018, but remains increased from earlier this month and last. Stable lung volumes. No pneumothorax or pleural effusion. Stable cardiac size and mediastinal contours. Visualized tracheal air column is within normal limits. Paucity bowel gas in the upper abdomen. No acute osseous abnormality identified. IMPRESSION: Regressed but not resolved bilateral pulmonary interstitial opacity since 11/11/2018. This could reflect improved pulmonary edema or regressed viral/atypical respiratory infection. Electronically Signed   By: Genevie Ann M.D.   On: 11/14/2018 21:42    Scheduled Meds: . aspirin EC  81 mg Oral QHS  . ezetimibe  10 mg Oral Daily  . feeding supplement (ENSURE ENLIVE)  237 mL Oral BID BM  . heparin injection (subcutaneous)  5,000 Units Subcutaneous Q8H  . ipratropium-albuterol  3 mL Nebulization BID  . isosorbide mononitrate  30 mg Oral Daily  . metoprolol succinate  12.5 mg Oral Daily  . mometasone-formoterol  1 puff Inhalation BID  . predniSONE  20 mg Oral BID WC  . sertraline  50 mg Oral Daily  . simvastatin  40 mg Oral q1800   Continuous Infusions:    LOS: 5 days   Marylu Lund, MD [facility] Pager On Amion  If 7PM-7AM, please contact night-coverage 11/15/2018, 5:36 PM

## 2018-11-15 NOTE — Progress Notes (Signed)
patient desating into the 70s on 6L. sating 84 on 15L, will apply a mask to increase sats >90. BP also low 84/62 manually. Will continue to closely monitor. Patient denies CP and nausea.  Barbee Shropshire. Brigitte Pulse, RN

## 2018-11-15 NOTE — Significant Event (Signed)
Rapid Response Event Note  Overview: Time Called: 2141 Arrival Time: 2151 Event Type: Respiratory called d/t pt was SOB and, c/o nausea.  Initial Focused Assessment: pt sitting up in bed, somewhart SOB.   C/o some right arm pain rated 2/10.  Also, very labile with any pain questions.  C/o pain in her chest but when questioned about the specifics she denied any pain.  Pt A/O.  Pt nurse had issues with pt blood pressure in her left arm being low therefore blood pressure taken in right arm and it was WNL. (see VS flow sheet).   Interventions: Pt RN already had spoken with [facility] and had lasix orders.  Upon assessment of pt her chest had crackles.  Lasix IV given d/t stable BP.  EKG complete, VS in flow sheet.      Plan of Care (if not transferred): Pt says she feels much better, now.  Pt nurse instructed to call with any concerns and continue to monitor pt closely.   Dyann Ruddle

## 2018-11-16 LAB — CREATININE, SERUM
Creatinine, Ser: 0.77 mg/dL (ref 0.44–1.00)
GFR calc non Af Amer: >60 mL/min (ref >60)

## 2018-11-16 MED ORDER — FUROSEMIDE 40 MG PO TABS
40.0000 mg | ORAL_TABLET | Freq: Every day | ORAL | Status: DC
  Administered 2018-11-16 – 2018-11-19 (×4): 40 mg via ORAL
  Filled 2018-11-16 (×4): qty 1

## 2018-11-16 NOTE — Care Management (Signed)
This CM spoke with pt at bedside and daughter via phone. Choice offered for home hospice services per MD verbal request. [facility] chosen and referral center called. Pt is already on 5L of home 02 and doesn't express any need for additional equipment for home at this time. Marney Doctor RN,BSN [phone]

## 2018-11-16 NOTE — Progress Notes (Addendum)
PROGRESS NOTE    Linda Kelly  MBE:[id number] DOB: [birth year] DOA: 11/10/2018 PCP: Binnie Rail, MD    Brief Narrative:  85 year old female who presented with dyspnea. She does have significant past medical history for severe multivessel coronary artery disease not  for revascularization, peripheral vascular disease, chronic diastolic heart failure, pulmonary fibrosis with proximal respiratory failure, COPD, GERD, depression and history of CVA.  Reported gradual worsening of shortness of breath, refractive to have bronchodilators at home.  She will physical examination blood pressure 114/98, heart rate 93, respiratory rate 28, temperature 97.7, oxygen saturation 98% on supplemental oxygen, she had dry mucous membranes, lungs with wheezing and rales bilaterally, heart S1-S2 present rhythmic, abdomen soft nontender, no lower extremity edema.  Sodium 143, potassium 3.4, chloride 102, bicarb 24, glucose 213, BUN 23, creatinine 1.59, white count 13.4, hemoglobin 13.7, hematocrit 44.1, platelets 232, troponin 2.0.  Urine analysis was negative for infection.  CT of the abdomen pelvis with no acute process.  Chest x-ray with chronic fibrotic changes.  EKG with sinus rhythm, left axis deviation, prolonged QTC.  Patient was admitted to the hospital with working diagnosis of suspected sepsis complicated by type II MI/demand ischemia.    Assessment & Plan:   Active Problems:   Hyperlipidemia   Essential hypertension   CAD S/P percutaneous coronary angioplasty 1985   COPD mixed type (HCC)   GERD   Dyspnea   PVD (peripheral vascular disease) (HCC)   NSTEMI myocardial infarction (HCC)   Chronic combined systolic (congestive) and diastolic (congestive) heart failure (HCC)   Pulmonary fibrosis (HCC)   Chronic respiratory failure with hypoxia (HCC)   Demand ischemia (HCC)   Elevated troponin   Severe sepsis (HCC)   Prolonged QT interval   Sepsis ([place])   Respiratory failure ([place])   1. Sepsis  ruled out. Patient has remained hemodynamically stable, no source of infection, abx on hold for now. Remains stable  2. Pulmonary fibrosis and COPD with acute on chronic hypoxic respiratory failure. Chest film with no new infiltrates that may suggest IPF flare. Pt had been continued on low dose methylprednisolone and bronchodilator therapy for presumed underlying COPD exacerbation.  Initial question of PE, thus VQ was ordered, however pt is unable to perform study given O2 requirements. Heparin drip was started empirically, transition to prophylactic anticoagulation -Appreciate input by Pulmonary. Recommendation to wean steroids as tolerated and avoid vol overload. Weaned to prednisone per PCCM. No wheezing on exam.  -Clinically improved with anxiolytics. Will continue. Presently on baseline 5LNC -Per Palliative Care, consideration for short term SNF if pt improves. Pt and family at bedside agrees. Will place SW consult for possible SNF  3. MI type 2. No clinical signs of acute coronary syndrome, will continue close monitoring of telemetry. Patient is known to have advance CAD. Isosorbide stopped secondary to hypotension. Continued on metoprolol with hold parameters. Remains without chest pains  4. Acute on chronic Diastolic heart failure. Seems to be stable. Recent CXR reviewed personally. Findings suggestive of edema, however pt's bp did not tolerate lasix. Continued on PO diuretic as tolerated. Pulm rec to avoid vol overload. Recently given gentle fluid bolus for overload, required lasix overnight. Cont to avoid overhydration. Will resume low dose lasix  5. Dementia. No agitation, continue neuro checks per unit protocol.   6. AKI, Renal function remains baseline labs reviewed. Resume lasix per above  7. Hypotension. BP consistently lower in R arm vs L. BP in L arm appears to be more accurate.  Imdur on hold   DVT prophylaxis: Heparin subq Code Status: DNR Family Communication: Pt in room,  daughter at bedside Disposition Plan: Uncertain at this time  Consultants:   CCM  Palliative Care  Procedures:     Antimicrobials: Anti-infectives (From admission, onward)   Start     Dose/Rate Route Frequency Ordered Stop   11/12/18 1800  vancomycin (VANCOCIN) IVPB 750 mg/150 ml premix  Status:  Discontinued     750 mg 150 mL/hr over 60 Minutes Intravenous Every 48 hours 11/11/18 1423 11/11/18 1543   11/11/18 2200  ceFEPIme (MAXIPIME) 1 g in sodium chloride 0.9 % 100 mL IVPB  Status:  Discontinued     1 g 200 mL/hr over 30 Minutes Intravenous Every 24 hours 11/11/18 1423 11/11/18 1543   11/10/18 1945  metroNIDAZOLE (FLAGYL) IVPB 500 mg  Status:  Discontinued     500 mg 100 mL/hr over 60 Minutes Intravenous Every 8 hours 11/10/18 1939 11/11/18 1543   11/10/18 1945  ceFEPIme (MAXIPIME) 2 g in sodium chloride 0.9 % 100 mL IVPB     2 g 200 mL/hr over 30 Minutes Intravenous  Once 11/10/18 1943 11/10/18 2125   11/10/18 1945  vancomycin (VANCOCIN) 1,500 mg in sodium chloride 0.9 % 500 mL IVPB     1,500 mg 250 mL/hr over 120 Minutes Intravenous  Once 11/10/18 1943 11/10/18 2358      Subjective: Reports being able sleep better last night  Objective: Vitals:   11/16/18 1009 11/16/18 1012 11/16/18 1113 11/16/18 1436  BP: 119/65 (!) 86/76 119/70 120/70  Pulse: 88 87  86  Resp: (!) 28   20  Temp: 97.6 F (36.4 C)   (!) 97.5 F (36.4 C)  TempSrc: Oral   Oral  SpO2: 94%   95%  Weight:      Height:        Intake/Output Summary (Last 24 hours) at 11/16/2018 1542 Last data filed at 11/16/2018 1321 Gross per 24 hour  Intake 620 ml  Output 1600 ml  Net -980 ml   Filed Weights   11/11/18 1700 11/13/18 0411 11/15/18 0434  Weight: 68.3 kg 65.2 kg 68.6 kg    Examination: General exam: Conversant, in no acute distress Respiratory system: normal chest rise, clear, no audible wheezing Cardiovascular system: regular rhythm, s1-s2 Gastrointestinal system: Nondistended,  nontender, pos BS Central nervous system: No seizures, no tremors Extremities: No cyanosis, no joint deformities Skin: No rashes, no pallor Psychiatry: Affect normal // no auditory hallucinations   Data Reviewed: I have personally reviewed following labs and imaging studies  CBC: Recent Labs  Lab 11/10/18 1816 11/11/18 0829 11/11/18 1731 11/12/18 0225 11/13/18 0554 11/14/18 0515  WBC 13.4* 13.3* 23.8* 28.4* 20.6* 19.2*  NEUTROABS 11.2*  --   --   --   --   --   HGB 13.7 11.3* 11.3* 12.0 10.3* 10.7*  HCT 44.1 36.8 35.9* 38.5 32.5* 34.6*  MCV 104.5* 101.9* 103.5* 104.1* 103.2* 102.7*  PLT 232 187 185 239 187 379   Basic Metabolic Panel: Recent Labs  Lab 11/10/18 1816 11/11/18 1731 11/12/18 0225 11/14/18 0515 11/15/18 0529 11/16/18 0521  NA 143 142  --  143 144  --   K 3.4* 4.4  --  4.3 3.5  --   CL 102 113*  --  104 100  --   CO2 24 22  --  29 32  --   GLUCOSE 203* 107*  --  145* 148*  --  BUN 23 25*  --  25* 31*  --   CREATININE 1.59* 1.05* 1.02* 0.90 1.05* 0.77  CALCIUM 8.2* 7.7*  --  8.2* 8.4*  --   MG  --  1.9  --   --   --   --   PHOS  --  3.4  --   --   --   --    GFR: Estimated Creatinine Clearance: 41 mL/min (by C-G formula based on SCr of 0.77 mg/dL). Liver Function Tests: Recent Labs  Lab 11/10/18 1816 11/11/18 1731 11/14/18 0515  AST 31 25 33  ALT 17 16 27   ALKPHOS 58 48 53  BILITOT 1.2 0.7 0.9  PROT 6.6 5.7* 5.5*  ALBUMIN 3.7 3.0* 3.1*   No results for input(s): LIPASE, AMYLASE in the last 168 hours. No results for input(s): AMMONIA in the last 168 hours. Coagulation Profile: Recent Labs  Lab 11/10/18 1938  INR 1.09   Cardiac Enzymes: Recent Labs  Lab 11/10/18 2341 11/11/18 0829 11/11/18 1731  TROPONINI 1.67* 1.18* 0.88*   BNP (last 3 results) Recent Labs    07/23/18 1111  PROBNP 162.0*   HbA1C: No results for input(s): HGBA1C in the last 72 hours. CBG: No results for input(s): GLUCAP in the last 168 hours. Lipid  Profile: No results for input(s): CHOL, HDL, LDLCALC, TRIG, CHOLHDL, LDLDIRECT in the last 72 hours. Thyroid Function Tests: No results for input(s): TSH, T4TOTAL, FREET4, T3FREE, THYROIDAB in the last 72 hours. Anemia Panel: No results for input(s): VITAMINB12, FOLATE, FERRITIN, TIBC, IRON, RETICCTPCT in the last 72 hours. Sepsis Labs: Recent Labs  Lab 11/10/18 1932 11/10/18 2121 11/11/18 1731 11/11/18 1950  PROCALCITON  --   --  <0.10  --   LATICACIDVEN 6.42* 7.25* 2.7* 3.2*    Recent Results (from the past 240 hour(s))  Culture, blood (routine x 2)     Status: None   Collection Time: 11/10/18  7:26 PM  Result Value Ref Range Status   Specimen Description   Final    RIGHT ANTECUBITAL Performed at [facility], [place] [address], [place], [place] [address]    Special Requests   Final    BOTTLES DRAWN AEROBIC AND ANAEROBIC Blood Culture adequate volume Performed at [facility] [address], [place], [place] [address]    Culture   Final    NO GROWTH 5 DAYS Performed at [facility], [place] [address] [place], [place] [address]    Report Status 11/15/2018 FINAL  Final  Culture, blood (routine x 2)     Status: Abnormal   Collection Time: 11/10/18  7:31 PM  Result Value Ref Range Status   Specimen Description   Final    LEFT ANTECUBITAL Performed at [facility] [address], [place], [place] [address]    Special Requests   Final    BOTTLES DRAWN AEROBIC AND ANAEROBIC Blood Culture adequate volume Performed at [facility] [address], [place], [place] [address]    Culture  Setup Time   Final    GRAM POSITIVE COCCI IN CHAINS ANAEROBIC BOTTLE ONLY CRITICAL RESULT CALLED TO, READ BACK BY AND VERIFIED WITH: Elenore Paddy PharmD 16:50 11/11/18 ([email or web address]) GRAM POSITIVE RODS AEROBIC BOTTLE ONLY CRITICAL RESULT CALLED TO, READ BACK BY AND VERIFIED WITH: Seleta Rhymes PharmD 17:35 11/14/18  ([email or web address])    Culture (A)  Final    VIRIDANS STREPTOCOCCUS THE SIGNIFICANCE OF ISOLATING THIS ORGANISM FROM A SINGLE SET  OF BLOOD CULTURES WHEN MULTIPLE SETS ARE DRAWN IS UNCERTAIN. PLEASE NOTIFY THE MICROBIOLOGY DEPARTMENT WITHIN ONE WEEK IF SPECIATION AND SENSITIVITIES ARE REQUIRED. Performed at [facility], [place] [address] [place], [place] [address]    Report Status 11/13/2018 FINAL  Final  Blood Culture ID Panel (Reflexed)     Status: Abnormal   Collection Time: 11/10/18  7:31 PM  Result Value Ref Range Status   Enterococcus species NOT DETECTED NOT DETECTED Final   Listeria monocytogenes NOT DETECTED NOT DETECTED Final   Staphylococcus species NOT DETECTED NOT DETECTED Final   Staphylococcus aureus (BCID) NOT DETECTED NOT DETECTED Final   Streptococcus species DETECTED (A) NOT DETECTED Final    Comment: Not Enterococcus species, Streptococcus agalactiae, Streptococcus pyogenes, or Streptococcus pneumoniae. CRITICAL RESULT CALLED TO, READ BACK BY AND VERIFIED WITH: Elenore Paddy PharmD 16:50 11/11/18 ([email or web address])    Streptococcus agalactiae NOT DETECTED NOT DETECTED Final   Streptococcus pneumoniae NOT DETECTED NOT DETECTED Final   Streptococcus pyogenes NOT DETECTED NOT DETECTED Final   Acinetobacter baumannii NOT DETECTED NOT DETECTED Final   Enterobacteriaceae species NOT DETECTED NOT DETECTED Final   Enterobacter cloacae complex NOT DETECTED NOT DETECTED Final   Escherichia coli NOT DETECTED NOT DETECTED Final   Klebsiella oxytoca NOT DETECTED NOT DETECTED Final   Klebsiella pneumoniae NOT DETECTED NOT DETECTED Final   Proteus species NOT DETECTED NOT DETECTED Final   Serratia marcescens NOT DETECTED NOT DETECTED Final   Haemophilus influenzae NOT DETECTED NOT DETECTED Final   Neisseria meningitidis NOT DETECTED NOT DETECTED Final   Pseudomonas aeruginosa NOT DETECTED NOT DETECTED Final   Candida albicans NOT DETECTED NOT DETECTED Final   Candida glabrata NOT DETECTED NOT  DETECTED Final   Candida krusei NOT DETECTED NOT DETECTED Final   Candida parapsilosis NOT DETECTED NOT DETECTED Final   Candida tropicalis NOT DETECTED NOT DETECTED Final    Comment: Performed at [facility], [place] [address] [place], [place] [address]  Urine Culture     Status: Abnormal   Collection Time: 11/10/18 10:38 PM  Result Value Ref Range Status   Specimen Description   Final    URINE, CLEAN CATCH Performed at [facility], [place] [address], [place], [place] [address]    Special Requests   Final    NONE Performed at [facility], [place] [address] [place], [place] [address]    Culture (A)  Final    <10,000 COLONIES/mL INSIGNIFICANT GROWTH Performed at [facility] [address] [place], [place] [address]    Report Status 11/12/2018 FINAL  Final  Respiratory Panel by PCR     Status: None   Collection Time: 11/10/18 11:15 PM  Result Value Ref Range Status   Adenovirus NOT DETECTED NOT DETECTED Final   Coronavirus 229E NOT DETECTED NOT DETECTED Final   Coronavirus HKU1 NOT DETECTED NOT DETECTED Final   Coronavirus NL63 NOT DETECTED NOT DETECTED Final   Coronavirus OC43 NOT DETECTED NOT DETECTED Final   Metapneumovirus NOT DETECTED NOT DETECTED Final   Rhinovirus / Enterovirus NOT DETECTED NOT DETECTED Final   Influenza A NOT DETECTED NOT DETECTED Final   Influenza B NOT DETECTED NOT DETECTED Final   Parainfluenza Virus 1 NOT DETECTED NOT DETECTED Final   Parainfluenza Virus 2 NOT DETECTED NOT DETECTED Final   Parainfluenza Virus 3 NOT DETECTED NOT DETECTED Final   Parainfluenza Virus 4 NOT DETECTED NOT DETECTED Final   Respiratory Syncytial Virus NOT DETECTED NOT  DETECTED Final   Bordetella pertussis NOT DETECTED NOT DETECTED Final   Chlamydophila pneumoniae NOT DETECTED NOT DETECTED Final   Mycoplasma pneumoniae NOT DETECTED NOT DETECTED Final    Comment: Performed at [facility], [place] [address]  [place], [place] [address]     Radiology Studies: Dg Chest [place] 1 View  Result Date: 11/14/2018 CLINICAL DATA:  85 year old female with shortness of breath. EXAM: PORTABLE CHEST 1 VIEW COMPARISON:  11/11/2018 and earlier. FINDINGS: Portable AP semi upright view at 2058 hours. Continued diffuse reticulonodular bilateral pulmonary opacity, improved since 11/11/2018, but remains increased from earlier this month and last. Stable lung volumes. No pneumothorax or pleural effusion. Stable cardiac size and mediastinal contours. Visualized tracheal air column is within normal limits. Paucity bowel gas in the upper abdomen. No acute osseous abnormality identified. IMPRESSION: Regressed but not resolved bilateral pulmonary interstitial opacity since 11/11/2018. This could reflect improved pulmonary edema or regressed viral/atypical respiratory infection. Electronically Signed   By: Genevie Ann M.D.   On: 11/14/2018 21:42    Scheduled Meds: . aspirin EC  81 mg Oral QHS  . ezetimibe  10 mg Oral Daily  . feeding supplement (ENSURE ENLIVE)  237 mL Oral BID BM  . heparin injection (subcutaneous)  5,000 Units Subcutaneous Q8H  . ipratropium-albuterol  3 mL Nebulization BID  . metoprolol succinate  12.5 mg Oral Daily  . mometasone-formoterol  1 puff Inhalation BID  . predniSONE  20 mg Oral BID WC  . sertraline  50 mg Oral Daily  . simvastatin  40 mg Oral q1800   Continuous Infusions:    LOS: 6 days   Marylu Lund, MD [facility] Pager On Amion  If 7PM-7AM, please contact night-coverage 11/16/2018, 3:42 PM

## 2018-11-16 NOTE — Progress Notes (Signed)
[facility]osp[facility] and [facility]) Hospital Liaison Visit: Received request from [facility] for patient/family interest in [facility] services at home after discharge. Chart and patient information reviewed and hospice eligibility has been confirmed.   Spoke with daughter [name] by phone to initiate education related to hospice philosophy, services and team approach to care. [name] verbalized understanding of information given. [name] is uncertain of discharge date. She will be coming to hospital later today. Hope to meet with her then.   Please send signed and completed DNR form home with patient/family.   Patient will need prescriptions for discharge comfort medications.  Per [name], patient already has oxygen, RW, BSC and shower chair from [facility]. She declined offer to order additional DME at this time.   [facility] aware of the above and will contact [name] to schedule RN visit at home after discharge.   Please send discharge summary to [facility] at [phone] when final. Please notify [facility] when patient is ready to leave the unit at discharge. Call [phone] between 8:30 and 5pm. Call [phone] after 5pm. Above information shared with [facility] Alinda Sierras. Please call with hospice related questions.  Thank you for this referral. Erling Conte, LCSW [phone]  [facility] are listed on AMION under Hospice and [place].

## 2018-11-17 ENCOUNTER — Telehealth: Payer: Self-pay | Admitting: Internal Medicine

## 2018-11-17 LAB — BASIC METABOLIC PANEL
Anion gap: 10 (ref 5–15)
BUN: 31 mg/dL — ABNORMAL HIGH (ref 8–23)
CO2: 31 mmol/L (ref 22–32)
CREATININE: 0.87 mg/dL (ref 0.44–1.00)
Calcium: 8.3 mg/dL — ABNORMAL LOW (ref 8.9–10.3)
Chloride: 101 mmol/L (ref 98–111)
GFR calc Af Amer: >60 mL/min (ref >60)
GFR calc non Af Amer: 59 mL/min — ABNORMAL LOW (ref >60)
Glucose, Bld: 137 mg/dL — ABNORMAL HIGH (ref 70–99)
Potassium: 3.5 mmol/L (ref 3.5–5.1)
SODIUM: 142 mmol/L (ref 135–145)

## 2018-11-17 NOTE — Care Management Important Message (Signed)
Important Message  Patient Details  Name: Linda Kelly MRN: [id number] Date of Birth: 03-15-1928   Medicare Important Message Given:  Yes    Kerin Salen 11/17/2018, 2:07 [place] Message  Patient Details  Name: Linda Kelly MRN: [id number] Date of Birth: 1928/09/18   Medicare Important Message Given:  Yes    Kerin Salen 11/17/2018, 2:07 PM

## 2018-11-17 NOTE — Telephone Encounter (Signed)
This ok with you?

## 2018-11-17 NOTE — Telephone Encounter (Signed)
yes

## 2018-11-17 NOTE — Telephone Encounter (Signed)
Copied from [place]. Topic: Quick Communication - Home Health Verbal Orders >> Nov 17, 2018  9:27 AM Alfredia Ferguson R wrote: Caller/Agency:Amy/[facility] Number: [phone]  Amy is calling in wanting to know if Dr Quay Burow will be the attending for patient

## 2018-11-17 NOTE — Telephone Encounter (Signed)
Pt is going to rehab instead.

## 2018-11-17 NOTE — Progress Notes (Signed)
Physical Therapy Treatment Patient Details Name: Linda Kelly MRN: [id number] DOB: July 09, 1928 Today's Date: 11/17/2018    History of Present Illness 85 y.o. female w/ a hx of pulmonary fibrosis on chronic home oxygen of 4-5 L, COPD, PVD, multivessel CAD, and combined systolic and diastolic heart failure (last EF 40-45%) and admitted to the hospital with working diagnosis of suspected sepsis complicated by type II MI/demand ischemia.     PT Comments    Progressing with mobility. Pt required slightly more assistance on today compared to last session. O2 readings: 92% 5L [place] at rest, 85% 6L [place] O2 during ambulation. Will need SNF rehab.    Follow Up Recommendations  SNF     Equipment Recommendations  None recommended by PT    Recommendations for Other Services       Precautions / Restrictions Precautions Precautions: Fall Precaution Comments: O2 dep Restrictions Weight Bearing Restrictions: No    Mobility  Bed Mobility   Bed Mobility: Supine to Sit     Supine to sit: Min guard;HOB elevated     General bed mobility comments: close guard for safety. Increased time.   Transfers Overall transfer level: Needs assistance Equipment used: 4-wheeled walker Transfers: Sit to/from Stand Sit to Stand: Min assist         General transfer comment: Assist to rise, stabilize, control descent. VCs safety, technique, hand placement, proper operation of rollator  Ambulation/Gait Ambulation/Gait assistance: Min assist Gait Distance (Feet): 25 Feet(x2) Assistive device: 4-wheeled walker Gait Pattern/deviations: Step-through pattern;Decreased stride length     General Gait Details: Slow, unsteady gait. Dyspnea 3/4. O2 sat 85% on 6L  O2 during ambulation. Seated rest break between walks. Cues for proper operation of rollator.    Stairs             Wheelchair Mobility    Modified Rankin (Stroke Patients Only)       Balance Overall balance assessment: Needs  assistance         Standing balance support: Bilateral upper extremity supported Standing balance-Leahy Scale: Poor                              Cognition Arousal/Alertness: Awake/alert Behavior During Therapy: WFL for tasks assessed/performed Overall Cognitive Status: Within Functional Limits for tasks assessed                                        Exercises      General Comments        Pertinent Vitals/Pain Pain Assessment: No/denies pain    Home Living                      Prior Function            PT Goals (current goals can now be found in the care plan section) Progress towards PT goals: Progressing toward goals    Frequency    Min 3X/week      PT Plan Current plan remains appropriate    Co-evaluation              AM-PAC PT "6 Clicks" Mobility   Outcome Measure  Help needed turning from your back to your side while in a flat bed without using bedrails?: A Little Help needed moving from lying on your back to sitting on the side of  a flat bed without using bedrails?: A Little Help needed moving to and from a bed to a chair (including a wheelchair)?: A Little Help needed standing up from a chair using your arms (e.g., wheelchair or bedside chair)?: A Little Help needed to walk in hospital room?: A Little Help needed climbing 3-5 steps with a railing? : A Lot 6 Click Score: 17    End of Session Equipment Utilized During Treatment: Gait belt;Oxygen Activity Tolerance: Patient limited by fatigue Patient left: in chair;with call bell/phone within reach;with chair alarm set;with family/visitor present   PT Visit Diagnosis: Difficulty in walking, not elsewhere classified (R26.2);Unsteadiness on feet (R26.81)     Time: 4580-9983 PT Time Calculation (min) (ACUTE ONLY): 25 min  Charges:  $Gait Training: 23-37 mins                       Weston Anna, PT Acute Rehabilitation Services Pager:  [phone] Office: [phone]

## 2018-11-17 NOTE — Progress Notes (Signed)
PT Cancellation Note  Patient Details Name: ALEZANDRA EGLI MRN: [id number] DOB: 1928-10-29   Cancelled Treatment:    Reason Eval/Treat Not Completed: Attempted PT tx session. Pt declined to participate at this time. She is agreeable to PT checking back another time/day.    Weston Anna, PT Acute Rehabilitation Services Pager: [phone] Office: [phone]

## 2018-11-17 NOTE — Progress Notes (Signed)
Patient ID: FRANCESA EUGENIO, female   DOB: 1928-02-27, 85 y.o.   MRN: [id number]  This NP visited patient at the bedside as a follow up to  yesterday's [place].  Patient remains weak but alert and oriented.  Plan continues to be  treat the treatable, stabilized and then discharge to SNF for short-term rehab dilatation with community-based palliative services  Patient  understand the seriousness of current medical situation and long term poor prognosis.   Discussed with patient the utilization of low-dose morphine solution for dyspnea.  Created space and opportunity for patient to explore her thoughts and feelings regarding her current medical situation She hopes for continued quality of life, does not want to "just exist".  Patient finds great pleasure in sharing her many interesting life experiences.  Discussed  the importance of continued conversation with her  family and the  medical providers regarding overall plan of care and treatment options,  ensuring decisions are within the context of the patients values and GOCs.  Questions and concerns addressed   Discussed with Dr Wyline Copas  Total time spent on the unit was 35 minutes  Greater than 50% of the time was spent in counseling and coordination of care  Wadie Lessen NP  Palliative Medicine Team Team Phone # [phone] Pager [phone]

## 2018-11-17 NOTE — Clinical Social Work Note (Signed)
Clinical Social Work Assessment  Patient Details  Name: Linda Kelly MRN: [id number] Date of Birth: 07-Sep-1928  Date of referral:  11/17/18               Reason for consult:  Facility Placement, Discharge Planning                Permission sought to share information with:  Family Supports Permission granted to share information::  Yes, Verbal Permission Granted  Name::     Linda Kelly  Agency::     Relationship::  daughter  Contact Information:  [phone]  Housing/Transportation Living arrangements for the past 2 months:  [place] of Information:  Patient Patient Interpreter Needed:  None Criminal Activity/Legal Involvement Pertinent to Current Situation/Hospitalization:  No - Comment as needed Significant Relationships:  Adult Children Lives with:  Self Do you feel safe going back to the place where you live?  Yes Need for family participation in patient care:  Yes (Comment)  Care giving concerns:  No family at bedside. patient has support from adult children    Facilities manager / plan:  Patient stated she would like to go to SNF instead of going home with hospice. CSW went over the process of SNF and patient stated she understands. CSW will follow up with bed offers   Employment status:  Retired Forensic scientist:  Other (Comment Required)([employer]) PT Recommendations:  [place] / Referral to community resources:  [place]  Patient/Family's Response to care:  Patient appreciative of CSW role in care  Patient/Family's Understanding of and Emotional Response to Diagnosis, Current Treatment, and Prognosis:  Patient wanting to go to rehab at discharge   Emotional Assessment Appearance:  Appears stated age Attitude/Demeanor/Rapport:  Engaged Affect (typically observed):  Accepting Orientation:  Oriented to Place, Oriented to Self, Oriented to  Time, Oriented to Situation Alcohol /  Substance use:  Not Applicable Psych involvement (Current and /or in the community):  No (Comment)  Discharge Needs  Concerns to be addressed:  Care Coordination Readmission within the last 30 days:  No Current discharge risk:  Dependent with Mobility Barriers to Discharge:  Ship broker, Continued Medical Work up   [employer], LCSW 11/17/2018, 4:40 PM

## 2018-11-17 NOTE — Progress Notes (Signed)
PROGRESS NOTE    Linda Kelly  KNL:[id number] DOB: 1928-01-10 DOA: 11/10/2018 PCP: Binnie Rail, MD    Brief Narrative:  85 year old female who presented with dyspnea. She does have significant past medical history for severe multivessel coronary artery disease not  for revascularization, peripheral vascular disease, chronic diastolic heart failure, pulmonary fibrosis with proximal respiratory failure, COPD, GERD, depression and history of CVA.  Reported gradual worsening of shortness of breath, refractive to have bronchodilators at home.  She will physical examination blood pressure 114/98, heart rate 93, respiratory rate 28, temperature 97.7, oxygen saturation 98% on supplemental oxygen, she had dry mucous membranes, lungs with wheezing and rales bilaterally, heart S1-S2 present rhythmic, abdomen soft nontender, no lower extremity edema.  Sodium 143, potassium 3.4, chloride 102, bicarb 24, glucose 213, BUN 23, creatinine 1.59, white count 13.4, hemoglobin 13.7, hematocrit 44.1, platelets 232, troponin 2.0.  Urine analysis was negative for infection.  CT of the abdomen pelvis with no acute process.  Chest x-ray with chronic fibrotic changes.  EKG with sinus rhythm, left axis deviation, prolonged QTC.  Patient was admitted to the hospital with working diagnosis of suspected sepsis complicated by type II MI/demand ischemia.    Assessment & Plan:   Active Problems:   Hyperlipidemia   Essential hypertension   CAD S/P percutaneous coronary angioplasty 1985   COPD mixed type (HCC)   GERD   Dyspnea   PVD (peripheral vascular disease) (HCC)   NSTEMI myocardial infarction (HCC)   Chronic combined systolic (congestive) and diastolic (congestive) heart failure (HCC)   Pulmonary fibrosis (HCC)   Chronic respiratory failure with hypoxia (HCC)   Demand ischemia (HCC)   Elevated troponin   Severe sepsis (HCC)   Prolonged QT interval   Sepsis ([place])   Respiratory failure ([place])   1. Sepsis  ruled out. Patient has remained hemodynamically stable, no source of infection, abx on hold for now. Remains stable  2. Pulmonary fibrosis and COPD with acute on chronic hypoxic respiratory failure. Chest film with no new infiltrates that may suggest IPF flare. Pt had been continued on low dose methylprednisolone and bronchodilator therapy for presumed underlying COPD exacerbation.  Initial question of PE, thus VQ was ordered, however pt is unable to perform study given O2 requirements. Heparin drip was started empirically, transition to prophylactic anticoagulation -Appreciate input by Pulmonary. Recommendation to wean steroids as tolerated and avoid vol overload. Weaned to prednisone per PCCM. No wheezing on exam.  -Clinically improved with anxiolytics. Will continue. Presently on baseline 5LNC -Per Palliative Care, consideration for short term SNF if pt improves. Pt and family at bedside agrees. SW consulted for SNF placement  3. MI type 2. No clinical signs of acute coronary syndrome, will continue close monitoring of telemetry. Patient is known to have advance CAD. Isosorbide stopped secondary to hypotension. Continued on metoprolol with hold parameters. Currently without chest pain  4. Acute on chronic Diastolic heart failure. Seems to be stable. Recent CXR reviewed personally. Findings suggestive of edema, however pt's bp did not tolerate lasix. Continued on PO diuretic as tolerated. Pulm rec to avoid vol overload. Recently given gentle fluid bolus for overload, required lasix overnight. Cont to avoid overhydration. Tolerating lasix  5. Dementia. No agitation, continue neuro checks per unit protocol.   6. AKI, Renal function remains baseline labs reviewed. Continue on lasix as tolerated  7. Hypotension. BP consistently lower in R arm vs L. BP in L arm appears to be more accurate. Imdur on hold  DVT prophylaxis: Heparin subq Code Status: DNR Family Communication: Pt in room, daughter  at bedside Disposition Plan: Uncertain at this time  Consultants:   CCM  Palliative Care  Procedures:     Antimicrobials: Anti-infectives (From admission, onward)   Start     Dose/Rate Route Frequency Ordered Stop   11/12/18 1800  vancomycin (VANCOCIN) IVPB 750 mg/150 ml premix  Status:  Discontinued     750 mg 150 mL/hr over 60 Minutes Intravenous Every 48 hours 11/11/18 1423 11/11/18 1543   11/11/18 2200  ceFEPIme (MAXIPIME) 1 g in sodium chloride 0.9 % 100 mL IVPB  Status:  Discontinued     1 g 200 mL/hr over 30 Minutes Intravenous Every 24 hours 11/11/18 1423 11/11/18 1543   11/10/18 1945  metroNIDAZOLE (FLAGYL) IVPB 500 mg  Status:  Discontinued     500 mg 100 mL/hr over 60 Minutes Intravenous Every 8 hours 11/10/18 1939 11/11/18 1543   11/10/18 1945  ceFEPIme (MAXIPIME) 2 g in sodium chloride 0.9 % 100 mL IVPB     2 g 200 mL/hr over 30 Minutes Intravenous  Once 11/10/18 1943 11/10/18 2125   11/10/18 1945  vancomycin (VANCOCIN) 1,500 mg in sodium chloride 0.9 % 500 mL IVPB     1,500 mg 250 mL/hr over 120 Minutes Intravenous  Once 11/10/18 1943 11/10/18 2358      Subjective: Reports breathing well thus far  Objective: Vitals:   11/17/18 0512 11/17/18 0854 11/17/18 1025 11/17/18 1101  BP:   100/61 111/63  Pulse:    91  Resp:    20  Temp:    97.7 F (36.5 C)  TempSrc:    Oral  SpO2:  91%  97%  Weight: 63.6 kg     Height:        Intake/Output Summary (Last 24 hours) at 11/17/2018 1428 Last data filed at 11/17/2018 1416 Gross per 24 hour  Intake 480 ml  Output 1600 ml  Net -1120 ml   Filed Weights   11/13/18 0411 11/15/18 0434 11/17/18 0512  Weight: 65.2 kg 68.6 kg 63.6 kg    Examination: General exam: Awake, laying in bed, in nad Respiratory system: Normal respiratory effort, no wheezing Cardiovascular system: regular rate, s1, s2 Gastrointestinal system: Soft, nondistended, positive BS Central nervous system: CN2-12 grossly intact, strength  intact Extremities: Perfused, no clubbing Skin: Normal skin turgor, no notable skin lesions seen Psychiatry: Mood normal // no visual hallucinations   Data Reviewed: I have personally reviewed following labs and imaging studies  CBC: Recent Labs  Lab 11/10/18 1816 11/11/18 0829 11/11/18 1731 11/12/18 0225 11/13/18 0554 11/14/18 0515  WBC 13.4* 13.3* 23.8* 28.4* 20.6* 19.2*  NEUTROABS 11.2*  --   --   --   --   --   HGB 13.7 11.3* 11.3* 12.0 10.3* 10.7*  HCT 44.1 36.8 35.9* 38.5 32.5* 34.6*  MCV 104.5* 101.9* 103.5* 104.1* 103.2* 102.7*  PLT 232 187 185 239 187 349   Basic Metabolic Panel: Recent Labs  Lab 11/10/18 1816 11/11/18 1731 11/12/18 0225 11/14/18 0515 11/15/18 0529 11/16/18 0521 11/17/18 0405  NA 143 142  --  143 144  --  142  K 3.4* 4.4  --  4.3 3.5  --  3.5  CL 102 113*  --  104 100  --  101  CO2 24 22  --  29 32  --  31  GLUCOSE 203* 107*  --  145* 148*  --  137*  BUN 23  25*  --  25* 31*  --  31*  CREATININE 1.59* 1.05* 1.02* 0.90 1.05* 0.77 0.87  CALCIUM 8.2* 7.7*  --  8.2* 8.4*  --  8.3*  MG  --  1.9  --   --   --   --   --   PHOS  --  3.4  --   --   --   --   --    GFR: Estimated Creatinine Clearance: 36.3 mL/min (by C-G formula based on SCr of 0.87 mg/dL). Liver Function Tests: Recent Labs  Lab 11/10/18 1816 11/11/18 1731 11/14/18 0515  AST 31 25 33  ALT 17 16 27   ALKPHOS 58 48 53  BILITOT 1.2 0.7 0.9  PROT 6.6 5.7* 5.5*  ALBUMIN 3.7 3.0* 3.1*   No results for input(s): LIPASE, AMYLASE in the last 168 hours. No results for input(s): AMMONIA in the last 168 hours. Coagulation Profile: Recent Labs  Lab 11/10/18 1938  INR 1.09   Cardiac Enzymes: Recent Labs  Lab 11/10/18 2341 11/11/18 0829 11/11/18 1731  TROPONINI 1.67* 1.18* 0.88*   BNP (last 3 results) Recent Labs    07/23/18 1111  PROBNP 162.0*   HbA1C: No results for input(s): HGBA1C in the last 72 hours. CBG: No results for input(s): GLUCAP in the last 168  hours. Lipid Profile: No results for input(s): CHOL, HDL, LDLCALC, TRIG, CHOLHDL, LDLDIRECT in the last 72 hours. Thyroid Function Tests: No results for input(s): TSH, T4TOTAL, FREET4, T3FREE, THYROIDAB in the last 72 hours. Anemia Panel: No results for input(s): VITAMINB12, FOLATE, FERRITIN, TIBC, IRON, RETICCTPCT in the last 72 hours. Sepsis Labs: Recent Labs  Lab 11/10/18 1932 11/10/18 2121 11/11/18 1731 11/11/18 1950  PROCALCITON  --   --  <0.10  --   LATICACIDVEN 6.42* 7.25* 2.7* 3.2*    Recent Results (from the past 240 hour(s))  Culture, blood (routine x 2)     Status: None   Collection Time: 11/10/18  7:26 PM  Result Value Ref Range Status   Specimen Description   Final    RIGHT ANTECUBITAL Performed at [facility], [place] [address] [place], [place] [address]    Special Requests   Final    BOTTLES DRAWN AEROBIC AND ANAEROBIC Blood Culture adequate volume Performed at [place] [address] [place], [place] [address]    Culture   Final    NO GROWTH 5 DAYS Performed at [facility], [place] [address], [place], [place] [address]    Report Status 11/15/2018 FINAL  Final  Culture, blood (routine x 2)     Status: Abnormal   Collection Time: 11/10/18  7:31 PM  Result Value Ref Range Status   Specimen Description   Final    LEFT ANTECUBITAL Performed at [place] [address] [place], [place] [address]    Special Requests   Final    BOTTLES DRAWN AEROBIC AND ANAEROBIC Blood Culture adequate volume Performed at [facility] [address], [place], [place] [address]    Culture  Setup Time   Final    GRAM POSITIVE COCCI IN CHAINS ANAEROBIC BOTTLE ONLY CRITICAL RESULT CALLED TO, READ BACK BY AND VERIFIED WITH: Elenore Paddy PharmD 16:50 11/11/18 ([email or web address]) GRAM POSITIVE RODS AEROBIC BOTTLE ONLY CRITICAL RESULT CALLED TO, READ BACK BY AND VERIFIED WITH: Seleta Rhymes PharmD 17:35  11/14/18 ([email or web address])    Culture (A)  Final    VIRIDANS STREPTOCOCCUS THE SIGNIFICANCE OF  ISOLATING THIS ORGANISM FROM A SINGLE SET OF BLOOD CULTURES WHEN MULTIPLE SETS ARE DRAWN IS UNCERTAIN. PLEASE NOTIFY THE MICROBIOLOGY DEPARTMENT WITHIN ONE WEEK IF SPECIATION AND SENSITIVITIES ARE REQUIRED. Performed at [facility], [place] [address], [place], [place] [address]    Report Status 11/13/2018 FINAL  Final  Blood Culture ID Panel (Reflexed)     Status: Abnormal   Collection Time: 11/10/18  7:31 PM  Result Value Ref Range Status   Enterococcus species NOT DETECTED NOT DETECTED Final   Listeria monocytogenes NOT DETECTED NOT DETECTED Final   Staphylococcus species NOT DETECTED NOT DETECTED Final   Staphylococcus aureus (BCID) NOT DETECTED NOT DETECTED Final   Streptococcus species DETECTED (A) NOT DETECTED Final    Comment: Not Enterococcus species, Streptococcus agalactiae, Streptococcus pyogenes, or Streptococcus pneumoniae. CRITICAL RESULT CALLED TO, READ BACK BY AND VERIFIED WITH: Elenore Paddy PharmD 16:50 11/11/18 ([email or web address])    Streptococcus agalactiae NOT DETECTED NOT DETECTED Final   Streptococcus pneumoniae NOT DETECTED NOT DETECTED Final   Streptococcus pyogenes NOT DETECTED NOT DETECTED Final   Acinetobacter baumannii NOT DETECTED NOT DETECTED Final   Enterobacteriaceae species NOT DETECTED NOT DETECTED Final   Enterobacter cloacae complex NOT DETECTED NOT DETECTED Final   Escherichia coli NOT DETECTED NOT DETECTED Final   Klebsiella oxytoca NOT DETECTED NOT DETECTED Final   Klebsiella pneumoniae NOT DETECTED NOT DETECTED Final   Proteus species NOT DETECTED NOT DETECTED Final   Serratia marcescens NOT DETECTED NOT DETECTED Final   Haemophilus influenzae NOT DETECTED NOT DETECTED Final   Neisseria meningitidis NOT DETECTED NOT DETECTED Final   Pseudomonas aeruginosa NOT DETECTED NOT DETECTED Final   Candida albicans NOT DETECTED NOT DETECTED Final   Candida glabrata NOT  DETECTED NOT DETECTED Final   Candida krusei NOT DETECTED NOT DETECTED Final   Candida parapsilosis NOT DETECTED NOT DETECTED Final   Candida tropicalis NOT DETECTED NOT DETECTED Final    Comment: Performed at [facility], [place] [address] [place], [place] [address]  Urine Culture     Status: Abnormal   Collection Time: 11/10/18 10:38 PM  Result Value Ref Range Status   Specimen Description   Final    URINE, CLEAN CATCH Performed at [facility], [place] [address], [place], [place] [address]    Special Requests   Final    NONE Performed at [facility], [place] [address] [place], [place] [address]    Culture (A)  Final    <10,000 COLONIES/mL INSIGNIFICANT GROWTH Performed at [facility] [address], [place], [place] [address]    Report Status 11/12/2018 FINAL  Final  Respiratory Panel by PCR     Status: None   Collection Time: 11/10/18 11:15 PM  Result Value Ref Range Status   Adenovirus NOT DETECTED NOT DETECTED Final   Coronavirus 229E NOT DETECTED NOT DETECTED Final   Coronavirus HKU1 NOT DETECTED NOT DETECTED Final   Coronavirus NL63 NOT DETECTED NOT DETECTED Final   Coronavirus OC43 NOT DETECTED NOT DETECTED Final   Metapneumovirus NOT DETECTED NOT DETECTED Final   Rhinovirus / Enterovirus NOT DETECTED NOT DETECTED Final   Influenza A NOT DETECTED NOT DETECTED Final   Influenza B NOT DETECTED NOT DETECTED Final   Parainfluenza Virus 1 NOT DETECTED NOT DETECTED Final   Parainfluenza Virus 2 NOT DETECTED NOT DETECTED Final   Parainfluenza Virus 3 NOT DETECTED NOT DETECTED Final   Parainfluenza Virus 4 NOT DETECTED NOT DETECTED Final  Respiratory Syncytial Virus NOT DETECTED NOT DETECTED Final   Bordetella pertussis NOT DETECTED NOT DETECTED Final   Chlamydophila pneumoniae NOT DETECTED NOT DETECTED Final   Mycoplasma pneumoniae NOT DETECTED NOT DETECTED Final    Comment: Performed at [facility], [place] [address], [place], [place] [address]     Radiology Studies: No results found.  Scheduled Meds: . aspirin EC  81 mg Oral QHS  . ezetimibe  10 mg Oral Daily  . feeding supplement (ENSURE ENLIVE)  237 mL Oral BID BM  . furosemide  40 mg Oral Daily  . heparin injection (subcutaneous)  5,000 Units Subcutaneous Q8H  . ipratropium-albuterol  3 mL Nebulization BID  . metoprolol succinate  12.5 mg Oral Daily  . mometasone-formoterol  1 puff Inhalation BID  . predniSONE  20 mg Oral BID WC  . sertraline  50 mg Oral Daily  . simvastatin  40 mg Oral q1800   Continuous Infusions:    LOS: 7 days   Marylu Lund, MD [facility] Pager On Amion  If 7PM-7AM, please contact night-coverage 11/17/2018, 2:28 PM

## 2018-11-17 NOTE — Care Management Note (Signed)
Case Management Note  Patient Details  Name: SIMRAN BOMKAMP MRN: [id number] Date of Birth: November 28, 1927  Subjective/Objective:  Medically stable for d/c.D/c plan SNF-CSW managing.                  Action/Plan:d/c SNF.   Expected Discharge Date:  (unknown)               Expected Discharge Plan:  Skilled Nursing Facility  In-House Referral:  Clinical Social Work  Discharge planning Services  CM Consult  Post Acute Care Choice:    Choice offered to:     DME Arranged:    DME Agency:     HH Arranged:    [place] Agency:     Status of Service:  In process, will continue to follow  If discussed at Long Length of Stay Meetings, dates discussed:    Additional Comments:  Dessa Phi, RN 11/17/2018, 12:40 PM

## 2018-11-17 NOTE — NC FL2 (Signed)
[place] MEDICAID FL2 LEVEL OF CARE SCREENING TOOL     IDENTIFICATION  Patient Name: Linda Kelly Birthdate: [birth year] Sex: female Admission Date (Current Location): 11/10/2018  [facility] and [place] Number:  Herbalist and Address:  [facility],  [place], [place]      Provider Number: [id number]  Attending Physician Name and Address:  Donne Hazel, MD  Relative Name and Phone Number:  [name], [phone]    Current Level of Care: Hospital Recommended Level of Care: [place] Prior Approval Number:    Date Approved/Denied:   PASRR Number: [id number]  Discharge Plan: SNF    Current Diagnoses: Patient Active Problem List   Diagnosis Date Noted  . Respiratory failure ([place]) 11/11/2018  . Sepsis ([place]) 11/10/2018  . Mild epistaxis 10/17/2018  . Acute renal failure (ARF) ([place]) 10/03/2018  . Acute combined systolic and diastolic congestive heart failure ([place]) 10/03/2018  . Acute lower UTI 10/03/2018  . Nausea and vomiting 10/03/2018  . Elevated liver enzymes 10/03/2018  . Prolonged QT interval 10/03/2018  . Severe sepsis ([place]) 10/02/2018  . Excessive cerumen in ear canal, bilateral 09/23/2018  . Left-sided headache 09/11/2017  . Ischemic cardiomyopathy 03/14/2017  . Pain of left upper extremity 02/18/2017  . Steroid-induced hyperglycemia 01/08/2017  . Anxiety state 01/08/2017  . Increased oxygen demand   . Palliative care by specialist   . Encounter for hospice care discussion   . Palliative care encounter   . Goals of care, counseling/discussion   . Demand ischemia ([place])   . Elevated troponin   . Chronic respiratory failure with hypoxia ([place]) 11/29/2016  . Episodic lightheadedness 08/28/2016  . Sleep difficulties 08/28/2016  . Chronic combined systolic (congestive) and diastolic (congestive) heart failure ([place])   . Pulmonary fibrosis ([place])   . NSTEMI myocardial infarction ([place]) 08/08/2016  .  Lumbar radiculopathy 08/01/2016  . Left-sided low back pain without sciatica 03/28/2016  . Cephalalgia 03/10/2016  . Overactive bladder 02/21/2016  . Poor balance 02/21/2016  . Chronic lower back pain 02/21/2016  . Carotid stenosis-s/p CEA 2015 03/03/2014  . PVD (peripheral vascular disease) ([place]) 02/04/2014  . Change in bowel habits 05/22/2011  . URINARY INCONTINENCE 12/22/2010  . ABDOMINAL BRUIT 08/10/2010  . FASTING HYPERGLYCEMIA 11/15/2009  . Depression 08/01/2009  . GERD 08/01/2009  . IRRITABLE BOWEL SYNDROME 12/27/2008  . Dyspnea 12/09/2008  . TINNITUS, CHRONIC 11/05/2008  . HEARING LOSS, HIGH FREQUENCY 11/05/2008  . HIATAL HERNIA 08/11/2008  . Hypotension 06/08/2008  . Lung nodule 11/20/2007  . OBESITY, MILD 11/19/2007  . Hyperlipidemia 10/09/2007  . Tobacco abuse, in remission 10/09/2007  . Essential hypertension 10/09/2007  . CAD S/P percutaneous coronary angioplasty 1985 10/09/2007  . COPD mixed type ([place]) 10/09/2007  . Dizziness 03/31/2007  . Headache 03/31/2007    Orientation RESPIRATION BLADDER Height & Weight     Self, Time, Situation, Place  O2(5L) Continent Weight: 140 lb 3.4 oz (63.6 kg) Height:  4\' 11"  (149.9 cm)  BEHAVIORAL SYMPTOMS/MOOD NEUROLOGICAL BOWEL NUTRITION STATUS      Continent Diet(heart healthy)  AMBULATORY STATUS COMMUNICATION OF NEEDS Skin   Limited Assist Verbally                         Personal Care Assistance Level of Assistance  Bathing, Feeding, Dressing Bathing Assistance: Limited assistance Feeding assistance: Independent Dressing Assistance: Limited assistance     Functional Limitations Info  Sight, Hearing, Speech Sight Info:  Adequate Hearing Info: Adequate Speech Info: Adequate    SPECIAL CARE FACTORS FREQUENCY  OT (By licensed OT), PT (By licensed PT)     PT Frequency: 5x wk OT Frequency: 5x wk            Contractures Contractures Info: Not present    Additional Factors Info  Code Status, Allergies  Code Status Info: DNR Allergies Info: No know allergies           Current Medications (11/17/2018):  This is the current hospital active medication list Current Facility-Administered Medications  Medication Dose Route Frequency Provider Last Rate Last Dose  . acetaminophen (TYLENOL) tablet 650 mg  650 mg Oral Q6H PRN Toy Baker, MD       Or  . acetaminophen (TYLENOL) suppository 650 mg  650 mg Rectal Q6H PRN Doutova, Anastassia, MD      . ALPRAZolam Duanne Moron) tablet 0.25 mg  0.25 mg Oral TID PRN Toy Baker, MD   0.25 mg at 11/12/18 1414  . aspirin EC tablet 81 mg  81 mg Oral QHS Toy Baker, MD   81 mg at 11/16/18 2005  . benzonatate (TESSALON) capsule 100 mg  100 mg Oral TID PRN Donne Hazel, MD   100 mg at 11/16/18 1835  . ezetimibe (ZETIA) tablet 10 mg  10 mg Oral Daily Doutova, Anastassia, MD   10 mg at 11/17/18 1019  . feeding supplement (ENSURE ENLIVE) (ENSURE ENLIVE) liquid 237 mL  237 mL Oral BID BM Donne Hazel, MD   237 mL at 11/17/18 1355  . furosemide (LASIX) tablet 40 mg  40 mg Oral Daily Donne Hazel, MD   40 mg at 11/17/18 1019  . heparin injection 5,000 Units  5,000 Units Subcutaneous Q8H Donne Hazel, MD   5,000 Units at 11/17/18 1355  . HYDROcodone-acetaminophen (NORCO/VICODIN) 5-325 MG per tablet 1-2 tablet  1-2 tablet Oral Q4H PRN Doutova, Anastassia, MD      . ipratropium-albuterol (DUONEB) 0.5-2.5 (3) MG/3ML nebulizer solution 3 mL  3 mL Nebulization BID Donne Hazel, MD   3 mL at 11/17/18 0852  . levalbuterol (XOPENEX) nebulizer solution 0.63 mg  0.63 mg Nebulization Q6H PRN Doutova, Anastassia, MD      . metoprolol succinate (TOPROL-XL) 24 hr tablet 12.5 mg  12.5 mg Oral Daily Donne Hazel, MD   12.5 mg at 11/16/18 1113  . mometasone-formoterol (DULERA) 200-5 MCG/ACT inhaler 1 puff  1 puff Inhalation BID Toy Baker, MD   1 puff at 11/17/18 0853  . morphine CONCENTRATE 10 MG/0.5ML oral solution 5 mg  5 mg Oral Q4H PRN  Knox Royalty, NP      . nitroGLYCERIN (NITROSTAT) SL tablet 0.4 mg  0.4 mg Sublingual Q5 min PRN Arrien, Jimmy Picket, MD      . predniSONE (DELTASONE) tablet 20 mg  20 mg Oral BID WC Tanda Rockers, MD   20 mg at 11/17/18 0834  . sertraline (ZOLOFT) tablet 50 mg  50 mg Oral Daily Arrien, Jimmy Picket, MD   50 mg at 11/17/18 1019  . simvastatin (ZOCOR) tablet 40 mg  40 mg Oral q1800 Arrien, Jimmy Picket, MD   40 mg at 11/16/18 1836     Discharge Medications: Please see discharge summary for a list of discharge medications.  Relevant Imaging Results:  Relevant Lab Results:   Additional Information SS# [id number]  Wende Neighbors, LCSW

## 2018-11-18 LAB — BASIC METABOLIC PANEL
Anion gap: 12 (ref 5–15)
BUN: 46 mg/dL — ABNORMAL HIGH (ref 8–23)
CALCIUM: 8.7 mg/dL — AB (ref 8.9–10.3)
CO2: 31 mmol/L (ref 22–32)
Chloride: 97 mmol/L — ABNORMAL LOW (ref 98–111)
Creatinine, Ser: 0.87 mg/dL (ref 0.44–1.00)
GFR calc non Af Amer: 59 mL/min — ABNORMAL LOW (ref >60)
Glucose, Bld: 140 mg/dL — ABNORMAL HIGH (ref 70–99)
Potassium: 3.7 mmol/L (ref 3.5–5.1)
SODIUM: 140 mmol/L (ref 135–145)

## 2018-11-18 MED ORDER — MORPHINE SULFATE (CONCENTRATE) 10 MG/0.5ML PO SOLN: 5.0000 mg | ORAL | 0 refills | Status: AC | PRN

## 2018-11-18 MED ORDER — PREDNISONE 10 MG PO TABS: 10.0000 mg | ORAL_TABLET | Freq: Every day | ORAL | Status: DC

## 2018-11-18 MED ORDER — HYDROCODONE-ACETAMINOPHEN 5-325 MG PO TABS: 1.0000 | ORAL_TABLET | ORAL | 0 refills | Status: AC | PRN

## 2018-11-18 MED ORDER — PREDNISONE 10 MG PO TABS: 5.0000 mg | ORAL_TABLET | Freq: Every day | ORAL | 0 refills | Status: DC

## 2018-11-18 MED ORDER — FUROSEMIDE 40 MG PO TABS: 40.0000 mg | ORAL_TABLET | Freq: Every day | ORAL | 0 refills | Status: AC

## 2018-11-18 MED ORDER — BENZONATATE 100 MG PO CAPS: 100.0000 mg | ORAL_CAPSULE | Freq: Three times a day (TID) | ORAL | 0 refills | Status: AC | PRN

## 2018-11-18 NOTE — Progress Notes (Signed)
CSW following for discharge. Patient has bed at [facility] but CSW has not received authorization through patient insurance. Patient unable to discharge with out approval. CSW made patient aware. CSW will follow up with insurance company in the morning   Rhea Pink, MSW,  [place]

## 2018-11-18 NOTE — Progress Notes (Signed)
CSW received insurance authorization for patient to DC to [facility] (authorization #: [id number]). CSW attempted to contact [facility] to determine if they can accept patient for a late admission- CSW was unable to speak with admissions and left voicemail for return call.   CSW will leave authorization information for unit social worker.   Kingsley Spittle, LCSW Clinical Social Worker  System Wide Float  [phone]

## 2018-11-18 NOTE — Discharge Summary (Signed)
Physician Discharge Summary  Linda Kelly VVO:[id number] DOB: 1928/06/12 DOA: 11/10/2018  PCP: Binnie Rail, MD  Admit date: 11/10/2018 Discharge date: 11/18/2018  Admitted From: Home Disposition:  SNF  Recommendations for Outpatient Follow-up:  1. Follow up with PCP in 1-2 weeks 2. Follow up with Pulmonary as scheduled 3. Recommend Palliative Care referral at facility  Discharge Condition:Stable CODE STATUS:DNR Diet recommendation: Heart healthy   Brief/Interim Summary: 85 year old female who presented with dyspnea. She does have significant past medical history for severe multivessel coronary artery disease not for revascularization, peripheral vascular disease, chronic diastolic heart failure, pulmonary fibrosis with proximal respiratory failure, COPD, GERD, depression and history of CVA. Reported gradual worsening of shortness of breath, refractive to have bronchodilators at home.She will physical examination blood pressure 114/98, heart rate 93, respiratory rate 28, temperature 97.7, oxygen saturation 98% on supplemental oxygen,she had dry mucous membranes, lungs with wheezing and rales bilaterally, heart S1-S2 present rhythmic, abdomen soft nontender, no lower extremity edema.Sodium 143, potassium 3.4, chloride 102, bicarb 24, glucose 213, BUN 23, creatinine 1.59, white count 13.4, hemoglobin 13.7, hematocrit 44.1, platelets 232, troponin 2.0. Urine analysis was negative for infection. CT of the abdomen pelvis with no acute process.Chest x-ray with chronic fibrotic changes. EKG with sinus rhythm, left axis deviation, prolonged QTC.  Patient was admitted to the hospital with working diagnosis of suspected sepsis complicated by type II MI/demand ischemia.  Discharge Diagnoses:  Active Problems:   Hyperlipidemia   Essential hypertension   CAD S/P percutaneous coronary angioplasty 1985   COPD mixed type (HCC)   GERD   Dyspnea   PVD (peripheral vascular disease) (HCC)    NSTEMI myocardial infarction (HCC)   Chronic combined systolic (congestive) and diastolic (congestive) heart failure (HCC)   Pulmonary fibrosis (HCC)   Chronic respiratory failure with hypoxia (HCC)   Demand ischemia (HCC)   Elevated troponin   Severe sepsis (HCC)   Prolonged QT interval   Sepsis ([place])   Respiratory failure ([place])  1.Sepsis ruled out. Patient has remained hemodynamically stable, no source of infection, abx on hold for now. Remains stable  2. Pulmonary fibrosis and COPD with acute on chronic hypoxic respiratory failure. Chest film with no new infiltrates that may suggest IPF flare. Pt had been continued on low dose methylprednisolone and bronchodilator therapy for presumed underlying COPD exacerbation.  Initial question of PE, thus VQ was ordered, however pt is unable to perform study given O2 requirements. Heparin drip was started empirically, transition to prophylactic anticoagulation -Appreciate input by Pulmonary. Recommendation to wean steroids as tolerated and avoid vol overload. Weaned to prednisone per PCCM. No wheezing on exam.  -Clinically improved with anxiolytics. Will continue. Presently on baseline 5LNC -Per Palliative Care, consideration for short term SNF if pt improves. Pt and family at bedside agrees.  -Recommend palliative care referral at facility. Anticipate eventual d/c home with hospice -Recommend follow up with Pulmonary on discharge, Dr. Annamaria Boots  3. MI type 2.No clinical signs of acute coronary syndrome, will continue close monitoring of telemetry. Patient is known to have advance CAD. Isosorbide stopped secondary to hypotension. Continued on metoprolol with hold parameters. Currently without chest pain  4. Acute on chronic Diastolic heart failure. Seems to be stable. Recent CXR reviewed personally. Findings suggestive of edema, however pt's bp did not tolerate lasix. Continued on PO diuretic as tolerated. Pulm rec to avoid vol overload. Recently given  gentle fluid bolus for overload, required lasix overnight. Cont to avoid overhydration. Tolerating lasix  5. Dementia.  No agitation, continue neuro checks per unit protocol.   6. AKI, Renal function remains baseline labs reviewed. Continue on lasix as tolerated  7. Hypotension. BP consistently lower in R arm vs L. BP in L arm appears to be more accurate. Imdur on hold   Discharge Instructions   Allergies as of 11/18/2018   No Known Allergies     Medication List    TAKE these medications   acetaminophen 650 MG CR tablet Commonly known as:  TYLENOL Take 650 mg by mouth daily.   ALPRAZolam 0.25 MG tablet Commonly known as:  XANAX Take 1 tablet (0.25 mg total) by mouth 3 (three) times daily as needed for anxiety.   aspirin EC 81 MG tablet Take 81 mg by mouth at bedtime.   benzonatate 100 MG capsule Commonly known as:  TESSALON Take 1 capsule (100 mg total) by mouth 3 (three) times daily as needed for cough.   cholecalciferol 1000 units tablet Commonly known as:  VITAMIN D Take 1,000 Units by mouth at bedtime.   ezetimibe 10 MG tablet Commonly known as:  ZETIA Take 1 tablet (10 mg total) by mouth daily.   famotidine 20 MG tablet Commonly known as:  PEPCID Take 1 tablet (20 mg total) by mouth 2 (two) times daily.   Fish Oil 1000 MG Caps Take 1,000 mg by mouth at bedtime.   furosemide 40 MG tablet Commonly known as:  LASIX Take 1 tablet (40 mg total) by mouth daily for 30 days. Start taking on:  November 19, 2018 What changed:  when to take this   HYDROcodone-acetaminophen 5-325 MG tablet Commonly known as:  NORCO/VICODIN Take 1-2 tablets by mouth every 4 (four) hours as needed for moderate pain. [place] reviewed. Rx needed for SNF placement   ipratropium-albuterol 0.5-2.5 (3) MG/3ML Soln Commonly known as:  DUONEB Take 3 mLs by nebulization every 4 (four) hours as needed. What changed:  reasons to take this   isosorbide mononitrate 30 MG 24 hr  tablet Commonly known as:  IMDUR TAKE 1 TABLET BY MOUTH EVERY DAY   metoprolol succinate 25 MG 24 hr tablet Commonly known as:  TOPROL-XL Take 0.5 tablets (12.5 mg total) by mouth daily.   montelukast 10 MG tablet Commonly known as:  SINGULAIR TAKE 1 TABLET BY MOUTH EVERYDAY AT BEDTIME   morphine CONCENTRATE 10 MG/0.5ML Soln concentrated solution Take 0.25 mLs (5 mg total) by mouth every 4 (four) hours as needed for shortness of breath. [place] reviewed. Rx needed for SNF placement   NASAL MOIST Gel Place 1 application into the nose daily as needed (nasal congestion).   nitroGLYCERIN 0.4 MG SL tablet Commonly known as:  NITROSTAT DISSOLVE 1 TABLET UNDER THE TONGUE EVERY 5 MINUTES FOR 3 DOSES AS NEEDED FOR CHEST PAIN. What changed:  See the new instructions.   potassium chloride 10 MEQ tablet Commonly known as:  K-DUR TAKE 1/2 TABLET ONCE DAILY   predniSONE 10 MG tablet Commonly known as:  DELTASONE Take 1 tablet (10 mg total) by mouth daily. Time spent: 20mg  po BID x 3 days, then 10mg  po bid x 3 days, then 5mg  po bid x 3 days, then continue with 5mg  po daily tablet per other rx What changed:    medication strength  how much to take  when to take this  additional instructions   predniSONE 10 MG tablet Commonly known as:  DELTASONE Take 0.5 tablets (5 mg total) by mouth daily for 30 days. Start after tapering down  to 5mg  daily dose What changed:  You were already taking a medication with the same name, and this prescription was added. Make sure you understand how and when to take each.   sertraline 50 MG tablet Commonly known as:  ZOLOFT TAKE 1 TABLET BY MOUTH EVERY DAY   simvastatin 40 MG tablet Commonly known as:  ZOCOR TAKE 1 TABLET BY MOUTH EVERY DAY What changed:  when to take this   sodium chloride 0.65 % Soln nasal spray Commonly known as:  OCEAN Place 1 spray into both nostrils as needed for congestion.   vitamin E 400 UNIT capsule Take 400 Units by  mouth at bedtime.       Contact information for follow-up providers    Baird Lyons D, MD. Schedule an appointment as soon as possible for a visit in 1 week(s).   Specialty:  Pulmonary Disease Contact information: [address] [place] [address] [phone]            Contact information for after-discharge care    Destination    [facility]erred SNF .   Service:  Skilled Nursing Contact information: [place] [phone]                 No Known Allergies  Consultations:  Pulmonary  Procedures/Studies: Ct Abdomen Pelvis Wo Contrast  Result Date: 11/10/2018 CLINICAL DATA:  Shortness of breath and generalized abdominal discomfort. History of COPD. EXAM: CT ABDOMEN AND PELVIS WITHOUT CONTRAST TECHNIQUE: Multidetector CT imaging of the abdomen and pelvis was performed following the standard protocol without IV contrast. COMPARISON:  10/02/2018 FINDINGS: Lower chest: Lung bases demonstrate chronic interstitial changes consistent with usual interstitial pneumonitis. There is interstitial fibrosis, mostly peripheral. Bronchiectasis and honeycomb changes. No superimposed consolidation visualized. Similar appearance to previous study. Hepatobiliary: No focal liver abnormality is seen. No gallstones, gallbladder wall thickening, or biliary dilatation. Pancreas: Unremarkable. No pancreatic ductal dilatation or surrounding inflammatory changes. Spleen: Normal in size without focal abnormality. Adrenals/Urinary Tract: No adrenal gland nodules. Kidneys are symmetrical. No hydronephrosis or hydroureter. No renal, ureteral, or bladder stones. No bladder wall thickening. Stomach/Bowel: Stomach, small bowel, and colon are not abnormally distended. There is a prominent diverticulum on the second portion of the duodenum. No wall thickening or inflammatory changes in the bowel. Diverticulosis of the sigmoid  colon without evidence of diverticulitis. Appendix is normal. Vascular/Lymphatic: Aortic atherosclerosis. No enlarged abdominal or pelvic lymph nodes. Reproductive: Uterus and bilateral adnexa are unremarkable. Other: No abdominal wall hernia or abnormality. No abdominopelvic ascites. Musculoskeletal: Degenerative changes in the spine and hips. IMPRESSION: No acute process demonstrated in the abdomen or pelvis. No evidence of bowel obstruction or inflammation. Diverticulosis of the colon without evidence of diverticulitis. Chronic interstitial changes in the lung bases. Aortic Atherosclerosis (ICD10-I70.0). Electronically Signed   By: Lucienne Capers M.D.   On: 11/10/2018 22:55   Dg Chest 2 View  Result Date: 11/10/2018 CLINICAL DATA:  Shortness of breath over 1 day. History of COPD. Home oxygen. EXAM: CHEST - 2 VIEW COMPARISON:  10/08/2018 FINDINGS: Mild cardiac enlargement. Low lung volumes. No vascular congestion or edema. Diffuse interstitial infiltrates in the lungs unchanged since previous studies consistent with chronic interstitial fibrosis. No blunting of costophrenic angles. No pneumothorax. Mediastinal contours appear intact. Calcification of the aorta. Esophageal hiatal hernia behind the heart. Degenerative changes in the spine and shoulders. IMPRESSION: Chronic interstitial fibrosis in the lungs. No evidence of active pulmonary disease. Electronically  Signed   By: Lucienne Capers M.D.   On: 11/10/2018 19:10   Dg Chest Port 1 View  Result Date: 11/14/2018 CLINICAL DATA:  85 year old female with shortness of breath. EXAM: PORTABLE CHEST 1 VIEW COMPARISON:  11/11/2018 and earlier. FINDINGS: Portable AP semi upright view at 2058 hours. Continued diffuse reticulonodular bilateral pulmonary opacity, improved since 11/11/2018, but remains increased from earlier this month and last. Stable lung volumes. No pneumothorax or pleural effusion. Stable cardiac size and mediastinal contours. Visualized  tracheal air column is within normal limits. Paucity bowel gas in the upper abdomen. No acute osseous abnormality identified. IMPRESSION: Regressed but not resolved bilateral pulmonary interstitial opacity since 11/11/2018. This could reflect improved pulmonary edema or regressed viral/atypical respiratory infection. Electronically Signed   By: Genevie Ann M.D.   On: 11/14/2018 21:42   Dg Chest Port 1 View  Result Date: 11/11/2018 CLINICAL DATA:  85 year old female with shortness of breath. EXAM: PORTABLE CHEST 1 VIEW COMPARISON:  11/10/2018 and earlier. FINDINGS: Portable AP semi upright view at 2010 hours. Diffuse bilateral increased pulmonary interstitial opacity with basilar predominance. Stable lung volumes and mediastinal contours. Visualized tracheal air column is within normal limits. No pneumothorax. No definite pleural effusion. No consolidation identified. IMPRESSION: Acute pulmonary edema suspected, with main differential consideration of acute viral/atypical respiratory infection. Electronically Signed   By: Genevie Ann M.D.   On: 11/11/2018 20:53     Subjective: No chest pain  Discharge Exam: Vitals:   11/18/18 0846 11/18/18 0902  BP: 108/76   Pulse: 93 95  Resp: 19 18  Temp: 97.6 F (36.4 C)   SpO2: 93% 94%   Vitals:   11/18/18 0135 11/18/18 0507 11/18/18 0846 11/18/18 0902  BP: 127/77 140/81 108/76   Pulse: 93 87 93 95  Resp: 20 20 19 18   Temp: 98.3 F (36.8 C) 98 F (36.7 C) 97.6 F (36.4 C)   TempSrc:   Oral   SpO2: 96% 97% 93% 94%  Weight:  64.5 kg    Height:        General: Pt is alert, awake, not in acute distress Cardiovascular: RRR, S1/S2 +, no rubs, no gallops Respiratory: CTA bilaterally, no wheezing, no rhonchi Abdominal: Soft, NT, ND, bowel sounds + Extremities: no edema, no cyanosis   The results of significant diagnostics from this hospitalization (including imaging, microbiology, ancillary and laboratory) are listed below for reference.      Microbiology: Recent Results (from the past 240 hour(s))  Culture, blood (routine x 2)     Status: None   Collection Time: 11/10/18  7:26 PM  Result Value Ref Range Status   Specimen Description   Final    RIGHT ANTECUBITAL Performed at [facility] [address], [place], [place] [address]    Special Requests   Final    BOTTLES DRAWN AEROBIC AND ANAEROBIC Blood Culture adequate volume Performed at [facility] [address], [place], [place] [address]    Culture   Final    NO GROWTH 5 DAYS Performed at [facility], [place] [address] [place], [place] [address]    Report Status 11/15/2018 FINAL  Final  Culture, blood (routine x 2)     Status: Abnormal   Collection Time: 11/10/18  7:31 PM  Result Value Ref Range Status   Specimen Description   Final    LEFT ANTECUBITAL Performed at [facility] [address], [place], [place] [address]    Special  Requests   Final    BOTTLES DRAWN AEROBIC AND ANAEROBIC Blood Culture adequate volume Performed at [facility] [address] [place], [place] [address]    Culture  Setup Time   Final    GRAM POSITIVE COCCI IN CHAINS ANAEROBIC BOTTLE ONLY CRITICAL RESULT CALLED TO, READ BACK BY AND VERIFIED WITH: Elenore Paddy PharmD 16:50 11/11/18 ([email or web address]) [place] CRITICAL RESULT CALLED TO, READ BACK BY AND VERIFIED WITH: Seleta Rhymes PharmD 17:35 11/14/18 ([email or web address])    Culture (A)  Final    VIRIDANS STREPTOCOCCUS THE SIGNIFICANCE OF ISOLATING THIS ORGANISM FROM A SINGLE SET OF BLOOD CULTURES WHEN MULTIPLE SETS ARE DRAWN IS UNCERTAIN. PLEASE NOTIFY THE MICROBIOLOGY DEPARTMENT WITHIN ONE WEEK IF SPECIATION AND SENSITIVITIES ARE REQUIRED. Performed at [facility], [place] [address] [place], [place] [address]    Report Status 11/13/2018 FINAL  Final  Blood Culture ID Panel (Reflexed)     Status: Abnormal   Collection Time:  11/10/18  7:31 PM  Result Value Ref Range Status   Enterococcus species NOT DETECTED NOT DETECTED Final   Listeria monocytogenes NOT DETECTED NOT DETECTED Final   Staphylococcus species NOT DETECTED NOT DETECTED Final   Staphylococcus aureus (BCID) NOT DETECTED NOT DETECTED Final   Streptococcus species DETECTED (A) NOT DETECTED Final    Comment: Not Enterococcus species, Streptococcus agalactiae, Streptococcus pyogenes, or Streptococcus pneumoniae. CRITICAL RESULT CALLED TO, READ BACK BY AND VERIFIED WITH: Elenore Paddy PharmD 16:50 11/11/18 ([email or web address])    Streptococcus agalactiae NOT DETECTED NOT DETECTED Final   Streptococcus pneumoniae NOT DETECTED NOT DETECTED Final   Streptococcus pyogenes NOT DETECTED NOT DETECTED Final   Acinetobacter baumannii NOT DETECTED NOT DETECTED Final   Enterobacteriaceae species NOT DETECTED NOT DETECTED Final   Enterobacter cloacae complex NOT DETECTED NOT DETECTED Final   Escherichia coli NOT DETECTED NOT DETECTED Final   Klebsiella oxytoca NOT DETECTED NOT DETECTED Final   Klebsiella pneumoniae NOT DETECTED NOT DETECTED Final   Proteus species NOT DETECTED NOT DETECTED Final   Serratia marcescens NOT DETECTED NOT DETECTED Final   Haemophilus influenzae NOT DETECTED NOT DETECTED Final   Neisseria meningitidis NOT DETECTED NOT DETECTED Final   Pseudomonas aeruginosa NOT DETECTED NOT DETECTED Final   Candida albicans NOT DETECTED NOT DETECTED Final   Candida glabrata NOT DETECTED NOT DETECTED Final   Candida krusei NOT DETECTED NOT DETECTED Final   Candida parapsilosis NOT DETECTED NOT DETECTED Final   Candida tropicalis NOT DETECTED NOT DETECTED Final    Comment: Performed at [facility], [place] [address] [place], [place] [address]  Urine Culture     Status: Abnormal   Collection Time: 11/10/18 10:38 PM  Result Value Ref Range Status   Specimen Description   Final    URINE, CLEAN CATCH Performed at [facility], [place]  [address], [place], [place] [address]    Special Requests   Final    NONE Performed at [facility], [place] [address], [place], [place] [address]    Culture (A)  Final    <10,000 COLONIES/mL INSIGNIFICANT GROWTH Performed at [facility] [address], [place], [place] [address]    Report Status 11/12/2018 FINAL  Final  Respiratory Panel by PCR     Status: None   Collection Time: 11/10/18 11:15 PM  Result Value Ref Range Status   Adenovirus NOT DETECTED NOT DETECTED Final   Coronavirus 229E NOT DETECTED NOT DETECTED Final  Coronavirus HKU1 NOT DETECTED NOT DETECTED Final   Coronavirus NL63 NOT DETECTED NOT DETECTED Final   Coronavirus OC43 NOT DETECTED NOT DETECTED Final   Metapneumovirus NOT DETECTED NOT DETECTED Final   Rhinovirus / Enterovirus NOT DETECTED NOT DETECTED Final   Influenza A NOT DETECTED NOT DETECTED Final   Influenza B NOT DETECTED NOT DETECTED Final   Parainfluenza Virus 1 NOT DETECTED NOT DETECTED Final   Parainfluenza Virus 2 NOT DETECTED NOT DETECTED Final   Parainfluenza Virus 3 NOT DETECTED NOT DETECTED Final   Parainfluenza Virus 4 NOT DETECTED NOT DETECTED Final   Respiratory Syncytial Virus NOT DETECTED NOT DETECTED Final   Bordetella pertussis NOT DETECTED NOT DETECTED Final   Chlamydophila pneumoniae NOT DETECTED NOT DETECTED Final   Mycoplasma pneumoniae NOT DETECTED NOT DETECTED Final    Comment: Performed at [facility], [place] [address] [place], [place] [address]     Labs: BNP (last 3 results) Recent Labs    11/10/18 1815  BNP 1,740.8*   Basic Metabolic Panel: Recent Labs  Lab 11/11/18 1731  11/14/18 0515 11/15/18 0529 11/16/18 0521 11/17/18 0405 11/18/18 0500  NA 142  --  143 144  --  142 140  K 4.4  --  4.3 3.5  --  3.5 3.7  CL 113*  --  104 100  --  101 97*  CO2 22  --  29 32  --  31 31  GLUCOSE 107*  --  145* 148*  --  137* 140*  BUN 25*  --  25* 31*  --  31* 46*  CREATININE 1.05*   < > 0.90  1.05* 0.77 0.87 0.87  CALCIUM 7.7*  --  8.2* 8.4*  --  8.3* 8.7*  MG 1.9  --   --   --   --   --   --   PHOS 3.4  --   --   --   --   --   --    < > = values in this interval not displayed.   Liver Function Tests: Recent Labs  Lab 11/11/18 1731 11/14/18 0515  AST 25 33  ALT 16 27  ALKPHOS 48 53  BILITOT 0.7 0.9  PROT 5.7* 5.5*  ALBUMIN 3.0* 3.1*   No results for input(s): LIPASE, AMYLASE in the last 168 hours. No results for input(s): AMMONIA in the last 168 hours. CBC: Recent Labs  Lab 11/11/18 1731 11/12/18 0225 11/13/18 0554 11/14/18 0515  WBC 23.8* 28.4* 20.6* 19.2*  HGB 11.3* 12.0 10.3* 10.7*  HCT 35.9* 38.5 32.5* 34.6*  MCV 103.5* 104.1* 103.2* 102.7*  PLT 185 239 187 213   Cardiac Enzymes: Recent Labs  Lab 11/11/18 1731  TROPONINI 0.88*   BNP: Invalid input(s): POCBNP CBG: No results for input(s): GLUCAP in the last 168 hours. D-Dimer No results for input(s): DDIMER in the last 72 hours. Hgb A1c No results for input(s): HGBA1C in the last 72 hours. Lipid Profile No results for input(s): CHOL, HDL, LDLCALC, TRIG, CHOLHDL, LDLDIRECT in the last 72 hours. Thyroid function studies No results for input(s): TSH, T4TOTAL, T3FREE, THYROIDAB in the last 72 hours.  Invalid input(s): FREET3 Anemia work up No results for input(s): VITAMINB12, FOLATE, FERRITIN, TIBC, IRON, RETICCTPCT in the last 72 hours. Urinalysis    Component Value Date/Time   COLORURINE YELLOW 11/10/2018 1816   APPEARANCEUR HAZY (A) 11/10/2018 1816   LABSPEC 1.017 11/10/2018 1816   PHURINE 5.0 11/10/2018 1816   GLUCOSEU NEGATIVE  11/10/2018 1816   HGBUR SMALL (A) 11/10/2018 1816   BILIRUBINUR NEGATIVE 11/10/2018 1816   BILIRUBINUR neg 09/06/2011 1502   KETONESUR NEGATIVE 11/10/2018 1816   PROTEINUR NEGATIVE 11/10/2018 1816   UROBILINOGEN 0.2 02/26/2014 1306   NITRITE NEGATIVE 11/10/2018 1816   LEUKOCYTESUR TRACE (A) 11/10/2018 1816   Sepsis Labs Invalid input(s): PROCALCITONIN,   WBC,  LACTICIDVEN Microbiology Recent Results (from the past 240 hour(s))  Culture, blood (routine x 2)     Status: None   Collection Time: 11/10/18  7:26 PM  Result Value Ref Range Status   Specimen Description   Final    RIGHT ANTECUBITAL Performed at [facility], [place] [address] [place], [place] [address]    Special Requests   Final    BOTTLES DRAWN AEROBIC AND ANAEROBIC Blood Culture adequate volume Performed at [facility] [address] [place], [place] [address]    Culture   Final    NO GROWTH 5 DAYS Performed at [facility], [place] [address] [place], [place] [address]    Report Status 11/15/2018 FINAL  Final  Culture, blood (routine x 2)     Status: Abnormal   Collection Time: 11/10/18  7:31 PM  Result Value Ref Range Status   Specimen Description   Final    LEFT ANTECUBITAL Performed at [facility] [address] [place], [place] [address]    Special Requests   Final    BOTTLES DRAWN AEROBIC AND ANAEROBIC Blood Culture adequate volume Performed at [facility] [address] [place], [place] [address]    Culture  Setup Time   Final    GRAM POSITIVE COCCI IN CHAINS ANAEROBIC BOTTLE ONLY CRITICAL RESULT CALLED TO, READ BACK BY AND VERIFIED WITH: Elenore Paddy PharmD 16:50 11/11/18 ([email or web address]) [place] CRITICAL RESULT CALLED TO, READ BACK BY AND VERIFIED WITH: Seleta Rhymes PharmD 17:35 11/14/18 ([email or web address])    Culture (A)  Final    VIRIDANS STREPTOCOCCUS THE SIGNIFICANCE OF ISOLATING THIS ORGANISM FROM A SINGLE SET OF BLOOD CULTURES WHEN MULTIPLE SETS ARE DRAWN IS UNCERTAIN. PLEASE NOTIFY THE MICROBIOLOGY DEPARTMENT WITHIN ONE WEEK IF SPECIATION AND SENSITIVITIES ARE REQUIRED. Performed at [facility], [place] [address], [place],  [address]    Report Status 11/13/2018 FINAL  Final  Blood Culture ID Panel (Reflexed)     Status: Abnormal    Collection Time: 11/10/18  7:31 PM  Result Value Ref Range Status   Enterococcus species NOT DETECTED NOT DETECTED Final   Listeria monocytogenes NOT DETECTED NOT DETECTED Final   Staphylococcus species NOT DETECTED NOT DETECTED Final   Staphylococcus aureus (BCID) NOT DETECTED NOT DETECTED Final   Streptococcus species DETECTED (A) NOT DETECTED Final    Comment: Not Enterococcus species, Streptococcus agalactiae, Streptococcus pyogenes, or Streptococcus pneumoniae. CRITICAL RESULT CALLED TO, READ BACK BY AND VERIFIED WITH: Elenore Paddy PharmD 16:50 11/11/18 ([email or web address])    Streptococcus agalactiae NOT DETECTED NOT DETECTED Final   Streptococcus pneumoniae NOT DETECTED NOT DETECTED Final   Streptococcus pyogenes NOT DETECTED NOT DETECTED Final   Acinetobacter baumannii NOT DETECTED NOT DETECTED Final   Enterobacteriaceae species NOT DETECTED NOT DETECTED Final   Enterobacter cloacae complex NOT DETECTED NOT DETECTED Final   Escherichia coli NOT DETECTED NOT DETECTED Final   Klebsiella oxytoca NOT DETECTED NOT DETECTED Final   Klebsiella pneumoniae NOT DETECTED NOT DETECTED Final   Proteus species NOT DETECTED NOT DETECTED Final  Serratia marcescens NOT DETECTED NOT DETECTED Final   Haemophilus influenzae NOT DETECTED NOT DETECTED Final   Neisseria meningitidis NOT DETECTED NOT DETECTED Final   Pseudomonas aeruginosa NOT DETECTED NOT DETECTED Final   Candida albicans NOT DETECTED NOT DETECTED Final   Candida glabrata NOT DETECTED NOT DETECTED Final   Candida krusei NOT DETECTED NOT DETECTED Final   Candida parapsilosis NOT DETECTED NOT DETECTED Final   Candida tropicalis NOT DETECTED NOT DETECTED Final    Comment: Performed at [facility], [place] [address], [place], [place] [address]  Urine Culture     Status: Abnormal   Collection Time: 11/10/18 10:38 PM  Result Value Ref Range Status   Specimen Description   Final    URINE, CLEAN CATCH Performed at [facility], [place] [address], [place], [place] [address]    Special Requests   Final    NONE Performed at [facility], [place] [address] [place], [place] [address]    Culture (A)  Final    <10,000 COLONIES/mL INSIGNIFICANT GROWTH Performed at [place] [address], [place], [place] [address]    Report Status 11/12/2018 FINAL  Final  Respiratory Panel by PCR     Status: None   Collection Time: 11/10/18 11:15 PM  Result Value Ref Range Status   Adenovirus NOT DETECTED NOT DETECTED Final   Coronavirus 229E NOT DETECTED NOT DETECTED Final   Coronavirus HKU1 NOT DETECTED NOT DETECTED Final   Coronavirus NL63 NOT DETECTED NOT DETECTED Final   Coronavirus OC43 NOT DETECTED NOT DETECTED Final   Metapneumovirus NOT DETECTED NOT DETECTED Final   Rhinovirus / Enterovirus NOT DETECTED NOT DETECTED Final   Influenza A NOT DETECTED NOT DETECTED Final   Influenza B NOT DETECTED NOT DETECTED Final   Parainfluenza Virus 1 NOT DETECTED NOT DETECTED Final   Parainfluenza Virus 2 NOT DETECTED NOT DETECTED Final   Parainfluenza Virus 3 NOT DETECTED NOT DETECTED Final   Parainfluenza Virus 4 NOT DETECTED NOT DETECTED Final   Respiratory Syncytial Virus NOT DETECTED NOT DETECTED Final   Bordetella pertussis NOT DETECTED NOT DETECTED Final   Chlamydophila pneumoniae NOT DETECTED NOT DETECTED Final   Mycoplasma pneumoniae NOT DETECTED NOT DETECTED Final    Comment: Performed at [facility], [place] [address], [place], [place] [address]   Time spent: 30 min  SIGNED:   Marylu Lund, MD  [facility] 11/18/2018, 11:52 AM  If 7PM-7AM, please contact night-coverage

## 2018-11-18 NOTE — Progress Notes (Signed)
Clinical Social Worker following patient for support and discharge needs. CSW met patient at bedside and gave patient the list of facilities that have made a bed offer. CSW encouraged patient to pick a facility so CSW will be able to let insurance company know which facility patient has chosen.   Rhea Pink, MSW,  [place]

## 2018-11-18 NOTE — Progress Notes (Signed)
Occupational Therapy Treatment Patient Details Name: Linda Kelly MRN: [id number] DOB: [birth year] Today's Date: 11/18/2018    History of present illness 85 y.o. female w/ a hx of pulmonary fibrosis on chronic home oxygen of 4-5 L, COPD, PVD, multivessel CAD, and combined systolic and diastolic heart failure (last EF 40-45%) and admitted to the hospital with working diagnosis of suspected sepsis complicated by type II MI/demand ischemia.    OT comments  Pt presents supine in bed pleasant and willing to participate in therapy session. Pt requiring minA for short distance mobility in room using RW and for transfer to recliner. Pt on 5L O2 during session with lowest O2 sat noted 84%, returning to >90% with seated rest and cues for deep breathing techniques; DOE 2-3/4. Pt currently requiring maxA for LB ADL due to difficulty accessing LEs, setup assist for seated grooming ADL. Noted pt with plans for ST rehab in SNF setting after discharge. Given pt's current level of assist feel this recommendation is appropriate, discharge recommendations have been updated to reflect. Will continue to follow acutely to progress pt towards established OT goals.    Follow Up Recommendations  SNF;Supervision/Assistance - 24 hour    Equipment Recommendations  Other (comment)(TBD in next venue)          Precautions / Restrictions Precautions Precautions: Fall Precaution Comments: O2 dep Restrictions Weight Bearing Restrictions: No       Mobility Bed Mobility Overal bed mobility: Needs Assistance Bed Mobility: Supine to Sit     Supine to sit: Min guard;HOB elevated     General bed mobility comments: close guard for safety. Increased time.   Transfers Overall transfer level: Needs assistance Equipment used: Rolling walker (2 wheeled) Transfers: Sit to/from Stand Sit to Stand: Min assist         General transfer comment: Assist to rise, stabilize, control descent. VCs safety, technique, hand  placement    Balance Overall balance assessment: Needs assistance Sitting-balance support: Feet supported;Bilateral upper extremity supported Sitting balance-Leahy Scale: Fair     Standing balance support: Bilateral upper extremity supported Standing balance-Leahy Scale: Poor Standing balance comment: requires UE support                           ADL either performed or assessed with clinical judgement   ADL Overall ADL's : Needs assistance/impaired     Grooming: Set up;Sitting Grooming Details (indicate cue type and reason): pt reports daughter can assist with grooming ADL needs, daughter assisting with setup for oral care at end of session             Lower Body Dressing: Maximal assistance;Sit to/from stand Lower Body Dressing Details (indicate cue type and reason): pt requires assist to don socks seated EOB; minA standing balance             Functional mobility during ADLs: Rolling walker;Minimal assistance General ADL Comments: pt agreeable to OOB to recliner this session; O2 down to 84% on 5L with mobility completion, pt cued for deep breathing techniques while seated with SpO2 returning to >90% within approx 1 min      Vision       Perception     Praxis      Cognition Arousal/Alertness: Awake/alert Behavior During Therapy: WFL for tasks assessed/performed Overall Cognitive Status: Within Functional Limits for tasks assessed  Exercises     Shoulder Instructions       General Comments      Pertinent Vitals/ Pain       Pain Assessment: No/denies pain  Home Living                                          Prior Functioning/Environment              Frequency  Min 2X/week        Progress Toward Goals  OT Goals(current goals can now be found in the care plan section)  Progress towards OT goals: Progressing toward goals  Acute Rehab OT Goals Patient  Stated Goal: home with daugther OT Goal Formulation: With patient Time For Goal Achievement: 11/27/18 Potential to Achieve Goals: Good  Plan Discharge plan needs to be updated    Co-evaluation                 AM-PAC OT "6 Clicks" Daily Activity     Outcome Measure   Help from another person eating meals?: None Help from another person taking care of personal grooming?: A Little Help from another person toileting, which includes using toliet, bedpan, or urinal?: A Little Help from another person bathing (including washing, rinsing, drying)?: A Little Help from another person to put on and taking off regular upper body clothing?: A Little Help from another person to put on and taking off regular lower body clothing?: A Lot 6 Click Score: 18    End of Session Equipment Utilized During Treatment: Gait belt;Rolling walker;Oxygen  OT Visit Diagnosis: Unsteadiness on feet (R26.81);Muscle weakness (generalized) (M62.81);Other abnormalities of gait and mobility (R26.89)   Activity Tolerance Patient tolerated treatment well;Patient limited by fatigue   Patient Left in chair;with call bell/phone within reach;with chair alarm set;with family/visitor present   Nurse Communication Mobility status        Time: 1210-1228 OT Time Calculation (min): 18 min  Charges: OT General Charges $OT Visit: 1 Visit OT Treatments $Self Care/Home Management : 8-22 mins  Lou Cal, OT Supplemental Rehabilitation Services Pager [phone] Office [phone]   Raymondo Band 11/18/2018, 1:37 PM

## 2018-11-18 NOTE — Care Management Note (Signed)
Case Management Note  Patient Details  Name: Linda Kelly MRN: [id number] Date of Birth: [birth year]  Subjective/Objective: CSW managing d/c plan SNF w/PCS.                   Action/Plan:dc SNF w/PCS   Expected Discharge Date:  (unknown)               Expected Discharge Plan:  [place]  In-House Referral:  Clinical Social Work  Discharge planning Services  CM Consult  Post Acute Care Choice:    Choice offered to:     DME Arranged:    DME Agency:     HH Arranged:    [place] Agency:     Status of Service:  Completed, signed off  If discussed at [employer], dates discussed:    Additional Comments:  Dessa Phi, RN 11/18/2018, 11:54 AM

## 2018-11-19 ENCOUNTER — Telehealth: Payer: Self-pay | Admitting: *Deleted

## 2018-11-19 DIAGNOSIS — I639 Cerebral infarction, unspecified: Secondary | ICD-10-CM | POA: Diagnosis not present

## 2018-11-19 DIAGNOSIS — M2550 Pain in unspecified joint: Secondary | ICD-10-CM | POA: Diagnosis not present

## 2018-11-19 DIAGNOSIS — I2510 Atherosclerotic heart disease of native coronary artery without angina pectoris: Secondary | ICD-10-CM | POA: Diagnosis not present

## 2018-11-19 DIAGNOSIS — F0390 Unspecified dementia without behavioral disturbance: Secondary | ICD-10-CM | POA: Diagnosis not present

## 2018-11-19 DIAGNOSIS — R0902 Hypoxemia: Secondary | ICD-10-CM | POA: Diagnosis not present

## 2018-11-19 DIAGNOSIS — Z515 Encounter for palliative care: Secondary | ICD-10-CM | POA: Diagnosis not present

## 2018-11-19 DIAGNOSIS — R2689 Other abnormalities of gait and mobility: Secondary | ICD-10-CM | POA: Diagnosis not present

## 2018-11-19 DIAGNOSIS — Z7401 Bed confinement status: Secondary | ICD-10-CM | POA: Diagnosis not present

## 2018-11-19 DIAGNOSIS — J8410 Pulmonary fibrosis, unspecified: Secondary | ICD-10-CM | POA: Diagnosis not present

## 2018-11-19 DIAGNOSIS — J9621 Acute and chronic respiratory failure with hypoxia: Secondary | ICD-10-CM | POA: Diagnosis not present

## 2018-11-19 DIAGNOSIS — I959 Hypotension, unspecified: Secondary | ICD-10-CM | POA: Diagnosis not present

## 2018-11-19 DIAGNOSIS — R41841 Cognitive communication deficit: Secondary | ICD-10-CM | POA: Diagnosis not present

## 2018-11-19 DIAGNOSIS — R079 Chest pain, unspecified: Secondary | ICD-10-CM | POA: Diagnosis not present

## 2018-11-19 DIAGNOSIS — R0789 Other chest pain: Secondary | ICD-10-CM | POA: Diagnosis not present

## 2018-11-19 DIAGNOSIS — I209 Angina pectoris, unspecified: Secondary | ICD-10-CM | POA: Diagnosis not present

## 2018-11-19 DIAGNOSIS — J9690 Respiratory failure, unspecified, unspecified whether with hypoxia or hypercapnia: Secondary | ICD-10-CM | POA: Diagnosis not present

## 2018-11-19 DIAGNOSIS — I110 Hypertensive heart disease with heart failure: Secondary | ICD-10-CM | POA: Diagnosis not present

## 2018-11-19 DIAGNOSIS — I5033 Acute on chronic diastolic (congestive) heart failure: Secondary | ICD-10-CM | POA: Diagnosis not present

## 2018-11-19 DIAGNOSIS — Z79899 Other long term (current) drug therapy: Secondary | ICD-10-CM | POA: Diagnosis not present

## 2018-11-19 DIAGNOSIS — R0602 Shortness of breath: Secondary | ICD-10-CM | POA: Diagnosis present

## 2018-11-19 DIAGNOSIS — Z85828 Personal history of other malignant neoplasm of skin: Secondary | ICD-10-CM | POA: Diagnosis not present

## 2018-11-19 DIAGNOSIS — I10 Essential (primary) hypertension: Secondary | ICD-10-CM | POA: Diagnosis not present

## 2018-11-19 DIAGNOSIS — F322 Major depressive disorder, single episode, severe without psychotic features: Secondary | ICD-10-CM | POA: Diagnosis not present

## 2018-11-19 DIAGNOSIS — A419 Sepsis, unspecified organism: Secondary | ICD-10-CM | POA: Diagnosis not present

## 2018-11-19 DIAGNOSIS — Z7982 Long term (current) use of aspirin: Secondary | ICD-10-CM | POA: Diagnosis not present

## 2018-11-19 DIAGNOSIS — F321 Major depressive disorder, single episode, moderate: Secondary | ICD-10-CM | POA: Diagnosis not present

## 2018-11-19 DIAGNOSIS — K219 Gastro-esophageal reflux disease without esophagitis: Secondary | ICD-10-CM | POA: Diagnosis not present

## 2018-11-19 DIAGNOSIS — R278 Other lack of coordination: Secondary | ICD-10-CM | POA: Diagnosis not present

## 2018-11-19 DIAGNOSIS — I739 Peripheral vascular disease, unspecified: Secondary | ICD-10-CM | POA: Diagnosis not present

## 2018-11-19 DIAGNOSIS — I5042 Chronic combined systolic (congestive) and diastolic (congestive) heart failure: Secondary | ICD-10-CM | POA: Diagnosis not present

## 2018-11-19 DIAGNOSIS — Z8673 Personal history of transient ischemic attack (TIA), and cerebral infarction without residual deficits: Secondary | ICD-10-CM | POA: Diagnosis not present

## 2018-11-19 DIAGNOSIS — J449 Chronic obstructive pulmonary disease, unspecified: Secondary | ICD-10-CM | POA: Diagnosis not present

## 2018-11-19 DIAGNOSIS — R2681 Unsteadiness on feet: Secondary | ICD-10-CM | POA: Diagnosis not present

## 2018-11-19 DIAGNOSIS — I509 Heart failure, unspecified: Secondary | ICD-10-CM | POA: Diagnosis not present

## 2018-11-19 NOTE — Progress Notes (Signed)
No charge Note:  No acute events overnight. No new complaints.  Patient is medically cleared for discharge to SNF.

## 2018-11-19 NOTE — Telephone Encounter (Signed)
Pt was on TCM report admitted 11/10/18 for suspected sepsis complicated by type II MI/demand ischemia. Sepsis r/o sincept remained hemodynamically syable, no source of infection, and abx on hold. Pulmonary fibrosis and COPD with acute on chronic hypoxic respiratory failure. Chest film with no new infiltrates that may suggest IPF flare. Pt had been continued on low dose methylprednisolone and bronchodilator therapy for presumed underlying COPD exacerbation. Consult w/pulmonologist recommended to wean off steroid and prednisone. D/c to SNF 11/18/18. Per summary will need to follow-up 1-2 weeks w/PCP after release from SNF.Marland KitchenJohny Chess

## 2018-11-19 NOTE — Progress Notes (Signed)
Report called to Darrick Penna, RN at [employer].

## 2018-11-19 NOTE — Progress Notes (Signed)
Clinical Social Worker facilitated patient discharge including contacting patient family and facility to confirm patient discharge plans.  Clinical information faxed to facility and family agreeable with plan.  CSW arranged ambulance transport via [facility] to [place] .  RN to call [phone] (rm# [phone]) for report prior to discharge.  Clinical Social Worker will sign off for now as social work intervention is no longer needed. Please consult [place] again if new need arises.  Rhea Pink, MSW, [place]

## 2018-11-19 NOTE — Consult Note (Signed)
   [facility] Inpatient Consult   11/19/2018  Linda Kelly June 02, 1928 [id number]    [facility] Care Management hospital liaison follow up.  Chart reviewed. Noted [name] discharged to [facility] [place] 11/19/2018. Will make referral to [place] for follow up since [name] was active with [place] [facility] prior to hospitalization.    Marthenia Rolling, MSN-Ed, RN,BSN [facility] Liaison [phone]

## 2018-11-20 ENCOUNTER — Encounter: Payer: Self-pay | Admitting: *Deleted

## 2018-11-20 ENCOUNTER — Other Ambulatory Visit: Payer: Self-pay | Admitting: *Deleted

## 2018-11-20 DIAGNOSIS — F0390 Unspecified dementia without behavioral disturbance: Secondary | ICD-10-CM | POA: Diagnosis not present

## 2018-11-20 DIAGNOSIS — J9621 Acute and chronic respiratory failure with hypoxia: Secondary | ICD-10-CM | POA: Diagnosis not present

## 2018-11-20 DIAGNOSIS — I639 Cerebral infarction, unspecified: Secondary | ICD-10-CM | POA: Diagnosis not present

## 2018-11-20 DIAGNOSIS — F322 Major depressive disorder, single episode, severe without psychotic features: Secondary | ICD-10-CM | POA: Diagnosis not present

## 2018-11-20 DIAGNOSIS — I5033 Acute on chronic diastolic (congestive) heart failure: Secondary | ICD-10-CM | POA: Diagnosis not present

## 2018-11-20 DIAGNOSIS — K219 Gastro-esophageal reflux disease without esophagitis: Secondary | ICD-10-CM | POA: Diagnosis not present

## 2018-11-20 DIAGNOSIS — A419 Sepsis, unspecified organism: Secondary | ICD-10-CM | POA: Diagnosis not present

## 2018-11-20 DIAGNOSIS — J449 Chronic obstructive pulmonary disease, unspecified: Secondary | ICD-10-CM | POA: Diagnosis not present

## 2018-11-20 DIAGNOSIS — J8410 Pulmonary fibrosis, unspecified: Secondary | ICD-10-CM | POA: Diagnosis not present

## 2018-11-20 DIAGNOSIS — I739 Peripheral vascular disease, unspecified: Secondary | ICD-10-CM | POA: Diagnosis not present

## 2018-11-20 DIAGNOSIS — I2510 Atherosclerotic heart disease of native coronary artery without angina pectoris: Secondary | ICD-10-CM | POA: Diagnosis not present

## 2018-11-20 NOTE — Patient Outreach (Signed)
[facility]) Care Management  11/20/2018  Linda Kelly 03-31-1928 [id number]   CSW was able to make contact with patient today to perform the initial assessment, as well as assess and assist with social work needs and services.  CSW met with patient at [facility] [place] where patient currently resides to receive short-term rehabilitative services.  CSW introduced self, explained role and types of services provided through [place] [facility] ([place] [facility]).  CSW further explained to patient that CSW works with Marthenia Rolling, [facility], also with [place] [facility]. CSW then explained the reason for the visit, indicating that Ms. Linda Kelly thought that patient would benefit from social work services and resources to assist with discharge planning needs from the skilled nursing facility.  CSW obtained two HIPAA compliant identifiers from patient, which included patient's name and date of birth.  Patient stated that she has already had an opportunity to work with therapies, both physical and occupational, even though she was just admitted to [facility] at 10:30AM yesterday (Wednesday, November 19, 2018) morning.  Patient believes that she will only have to stay at [facility] for two weeks, before being discharged back home.  Patient reported that she currently lives with her daughter, Linda Kelly, and will return there upon discharge.  Patient went on to say that Mrs. Linda Kelly is able to provide 24 hour care and supervision to patient in the home, as well as assist with activities of daily living.  Mrs. Linda Kelly will provide patient with transportation to and from her physician appointments, and continue to perform medication management.  CSW will make arrangements to follow-up with patient again on Tuesday, November 25, 2018 at 12:00PM at [place], [place], MSW, [place]  Licensed Clinical Social  Worker  [place]  Mailing [place] [address] [place], [place] [address] Physical Address-[address], [place], [place] [address] Toll Free Main # [phone] Fax # [phone] Cell # [phone]  Office # [phone] [name]

## 2018-11-20 NOTE — Patient Outreach (Signed)
[facility] Care Management  11/20/2018  TEESHA OHM 03/06/1928 [id number]  Onsite visit to [place] with [name], SW, she reports that she is putting in for a palliative Care consult. RNCM let [name] know patient is active with [facility] care management services.  Met with patient briefly, patient reports that she just completed therapy and was very tired.  RNCM explained role for [facility] at facility.  Patient requests that RNCM tell her CM [employer] hello.  Plan to collaborate with [facility] care team.  Royetta Crochet. Laymond Purser, MSN, RN, [employer] , [place] [phone]) Business Cell  [phone]) Toll Free Office

## 2018-11-25 ENCOUNTER — Other Ambulatory Visit: Payer: Self-pay | Admitting: *Deleted

## 2018-11-25 ENCOUNTER — Non-Acute Institutional Stay: Payer: HMO | Admitting: Internal Medicine

## 2018-11-25 NOTE — Patient Outreach (Signed)
[facility]) Care Management  11/25/2018  ADJA RUFF December 18, 1927 [id number]   CSW waited at [facility] [place] where patient currently resides to receive short-term rehabilitative services, to try and meet with patient today, but ended up only getting to visit with patient briefly. Patient was lying in her bed resting at the time of CSW's arrival.  Patient indicated that she has had a busy morning, now just wanting to be able to relax.   Per CSW's conversation with [name], Social Worker/Discharge Planning Coordinator with [facility], on Thursday, November 20, 2018, she was placing a palliative care consult for patient, that same day.  CSW noted that [name], Internal Medicine Nurse Practitioner with [facility], was scheduled to meet with patient today at 11:00AM.  CSW will review patient's chart to obtain information about the consultation.  At patient's request, CSW attempted to contact patient's daughter, [name], without success.  Patient felt that it would be best for CSW to converse with [name] regarding her current plan of care, as well as discharge plans, as Mrs. Forde Dandy is in charge of all of patient's affairs.  CSW left a HIPAA compliant message with Mrs. Forde Dandy and is currently awaiting a return call.  CSW agreed to follow-up with patient again next week, on Tuesday, December 02, 2018 at [place], [place], MSW, [place]  Licensed Clinical Social Worker  [place]  Mailing [place] [address], [place], [place] [address] Physical Address-[address], [place], [place] [address] Toll Free Main # [phone] Fax # [phone] Cell # [phone]  Office # [phone] [name]

## 2018-11-27 ENCOUNTER — Non-Acute Institutional Stay: Payer: HMO | Admitting: Internal Medicine

## 2018-11-27 ENCOUNTER — Encounter: Payer: Self-pay | Admitting: Internal Medicine

## 2018-11-27 VITALS — BP 124/70 | HR 80 | Resp 20 | Ht <= 58 in | Wt 141.8 lb

## 2018-11-27 DIAGNOSIS — Z515 Encounter for palliative care: Secondary | ICD-10-CM

## 2018-11-27 DIAGNOSIS — I5033 Acute on chronic diastolic (congestive) heart failure: Secondary | ICD-10-CM | POA: Diagnosis not present

## 2018-11-27 DIAGNOSIS — J8410 Pulmonary fibrosis, unspecified: Secondary | ICD-10-CM | POA: Diagnosis not present

## 2018-11-27 DIAGNOSIS — J449 Chronic obstructive pulmonary disease, unspecified: Secondary | ICD-10-CM | POA: Diagnosis not present

## 2018-11-27 DIAGNOSIS — I2510 Atherosclerotic heart disease of native coronary artery without angina pectoris: Secondary | ICD-10-CM | POA: Diagnosis not present

## 2018-11-27 NOTE — Progress Notes (Signed)
Jan 30th, 2020 [facility] Palliative Care Telephone: [phone] Fax: [phone]  PATIENT NAME: Linda Kelly DOB: 11-03-1927 MRN: [id number]  Linda Kelly [id number] (adm 11/19/2018)  PRIMARY CARE PROVIDER:  Binnie Rail, MD [place], [place] [address]  REFERRING PROVIDER: Dr. Wenda Low  RESPONSIBLE PARTY: (dtr) Linda Kelly [phone]  ASSESSMENT / RECOMENDATIONS:      1. Dyspnea and weakness 2/2 End Stage COPD/Pulmonary fiborsis, and CAD: PPS is 40%. Patient is independent in transfers, needs some assist with dressing and hygiene. She toilets independently and is continent of bowel and bladder. Gradual improvement in endurance ambulating with walker about 10', though with dyspnea. O2 5L. Good oral intake. Her weight is 141.8lbs. At 4'8" her BMI is 31.8 kg/m2.   2. Goals of care: Discussion with daughter Linda Kelly. She confirmed DNR status. She had initiated discussions with Palliative Care team in the hospital, and there was discussion of a possible Hospice admission after rehab stay. Daughter lives with patient, and her goal is for patient to stay with her, for a home death. Linda Kelly has some familiarity with Hospice as her Aunt and her dad used these services in the past.  3. F/U 3 weeks or so; help facilitate transition to hospice services if patient meets eligibility criteria.  I spent 90 minutes providing this consultation,  from 2:30 pm  to 4:00pm. More than 50% of the time in this consultation was spent coordinating communication, interview of pt, staff, and family. Reconciliation of [facility] EMR with facility MAR.   PAST MEDICAL HISTORY: [name] is an 85 yo Caucasian female with medical history of multivessel CAD (not a candidate for revascularization), peripheral vascular disease, chronic diastolic heart failure, pulmonary fibrosis with respiratory failure / COPD (O2 at 5L), GERD, depression, AKI, prolonged QTC, and CVA. She is s/p hospitalization 1/13-1/21/2020 for  COPD exacerbation. Now at [facility] for rehab.  CODE STATUS: DNR. MOST: DNR/DNI, Scope of Medical Care limited to Comfort measures, Antibiotics and IVFs if indicated. No feeding tube.  PPS: 40% HOSPICE ELIGIBILITY/DIAGNOSIS: possible / COPD, CAD   Past Medical History:  Diagnosis Date  . Anxiety   . Anxiety and depression   . Arthritis   . Benign neoplasm of esophagus   . CAD (coronary artery disease)   . Cancer [facility])    Skin cancer on back  . Carotid artery disease ([place])   . CHF (congestive heart failure) ([place])   . Chronic gastritis   . Colon, diverticulosis   . COPD (chronic obstructive pulmonary disease) ([place])   . Depression   . GERD (gastroesophageal reflux disease)   . Hiatal hernia   . High frequency hearing loss   . Hyperlipidemia   . IBS (irritable bowel syndrome)   . Idiopathic pulmonary fibrosis   . Internal hemorrhoid   . MI (myocardial infarction) ([place]) 1985  . Other nonthrombocytopenic purpuras   . PONV (postoperative nausea and vomiting)   . PVD (peripheral vascular disease) ([place])   . Shortness of breath    with exertion  . Solitary pulmonary nodule   . Stroke [facility])    Hx: of several mini -strokes  . Tinnitus    chronic  . Vertigo     SOCIAL HX:  Social History   Tobacco Use  . Smoking status: Former Smoker    Packs/day: 0.50    Years: 60.00    Pack years: 30.00    Types: Cigarettes    Last attempt to quit: 02/26/2014  Years since quitting: 4.7  . Smokeless tobacco: Never Used  Substance Use Topics  . Alcohol use: No    ALLERGIES: No Known Allergies   PERTINENT MEDICATIONS:  Outpatient Encounter Medications as of 11/27/2018  Medication Sig  . acetaminophen (TYLENOL) 650 MG CR tablet Take 650 mg by mouth daily.  Marland Kitchen ALPRAZolam (XANAX) 0.25 MG tablet Take 1 tablet (0.25 mg total) by mouth 3 (three) times daily as needed for anxiety.  Marland Kitchen aspirin EC 81 MG tablet Take 81 mg by mouth at bedtime.   . benzonatate (TESSALON) 100 MG capsule Take 1 capsule  (100 mg total) by mouth 3 (three) times daily as needed for cough.  . cholecalciferol (VITAMIN D) 1000 UNITS tablet Take 1,000 Units by mouth at bedtime.   Marland Kitchen ezetimibe (ZETIA) 10 MG tablet Take 1 tablet (10 mg total) by mouth daily.  . famotidine (PEPCID) 20 MG tablet Take 1 tablet (20 mg total) by mouth 2 (two) times daily.  . furosemide (LASIX) 40 MG tablet Take 1 tablet (40 mg total) by mouth daily for 30 days.  Marland Kitchen HYDROcodone-acetaminophen (NORCO/VICODIN) 5-325 MG tablet Take 1-2 tablets by mouth every 4 (four) hours as needed for moderate pain. [place] reviewed. Rx needed for SNF placement  . ipratropium-albuterol (DUONEB) 0.5-2.5 (3) MG/3ML SOLN Take 3 mLs by nebulization every 4 (four) hours as needed. (Patient taking differently: Take 3 mLs by nebulization every 4 (four) hours as needed (shortness of breath). )  . metoprolol succinate (TOPROL-XL) 25 MG 24 hr tablet Take 0.5 tablets (12.5 mg total) by mouth daily.  . montelukast (SINGULAIR) 10 MG tablet TAKE 1 TABLET BY MOUTH EVERYDAY AT BEDTIME  . Morphine Sulfate (MORPHINE CONCENTRATE) 10 MG/0.5ML SOLN concentrated solution Take 0.25 mLs (5 mg total) by mouth every 4 (four) hours as needed for shortness of breath. [place] reviewed. Rx needed for SNF placement  . nitroGLYCERIN (NITROSTAT) 0.4 MG SL tablet DISSOLVE 1 TABLET UNDER THE TONGUE EVERY 5 MINUTES FOR 3 DOSES AS NEEDED FOR CHEST PAIN. (Patient taking differently: Place 0.4 mg under the tongue every 5 (five) minutes as needed for chest pain. )  . Omega-3 Fatty Acids (FISH OIL) 1000 MG CAPS Take 1,000 mg by mouth at bedtime.   . potassium chloride (K-DUR) 10 MEQ tablet TAKE 1/2 TABLET ONCE DAILY (Patient taking differently: Take 5 mEq by mouth daily. )  . predniSONE (DELTASONE) 5 MG tablet Take 5 mg by mouth daily with breakfast.  . Propyl Glycol-Hydroxyethylcell (NASAL MOIST) GEL Place 1 application into the nose daily as needed (nasal congestion).  . sertraline (ZOLOFT) 50 MG tablet TAKE 1  TABLET BY MOUTH EVERY DAY (Patient taking differently: Take 50 mg by mouth daily. )  . simvastatin (ZOCOR) 40 MG tablet TAKE 1 TABLET BY MOUTH EVERY DAY (Patient taking differently: Take 40 mg by mouth daily at 6 PM. )  . sodium chloride (OCEAN) 0.65 % SOLN nasal spray Place 1 spray into both nostrils as needed for congestion.  . vitamin E 400 UNIT capsule Take 400 Units by mouth at bedtime.   . [DISCONTINUED] isosorbide mononitrate (IMDUR) 30 MG 24 hr tablet TAKE 1 TABLET BY MOUTH EVERY DAY  . [DISCONTINUED] predniSONE (DELTASONE) 10 MG tablet Take 1 tablet (10 mg total) by mouth daily. Time spent: 20mg  po BID x 3 days, then 10mg  po bid x 3 days, then 5mg  po bid x 3 days, then continue with 5mg  po daily tablet per other rx  . [DISCONTINUED] predniSONE (DELTASONE) 10 MG  tablet Take 0.5 tablets (5 mg total) by mouth daily for 30 days. Start after tapering down to 5mg  daily dose   No facility-administered encounter medications on file as of 11/27/2018.     PHYSICAL EXAM:  R arm BP more accurate; chronically low in L VS: BP 124/70, HR 80, RR 20 NAD, well nourished, pleasantly conversant Cardiovascular: regular rate and rhythm Pulmonary: overall insp crackels c/w fibrosis Abdomen: soft, nontender, + bowel sounds GU: no suprapubic tenderness Extremities: no edema, no joint deformities Skin: no rashes Neurological: Weakness but otherwise nonfocal  Julianne Handler, NP

## 2018-12-02 ENCOUNTER — Ambulatory Visit: Payer: HMO | Admitting: *Deleted

## 2018-12-02 ENCOUNTER — Other Ambulatory Visit: Payer: Self-pay | Admitting: *Deleted

## 2018-12-02 NOTE — Patient Outreach (Signed)
[facility]) Care Management  12/02/2018  Linda Kelly 08-Mar-1928 [id number]   CSW was able to meet with patient today at [facility] [place] where patient currently resides to receive short-term rehabilitative services.  Patient was pleasant, as usual, but really wanting answers about when the plan is for her to be discharged home.  Patient's attending nurse at [facility] was unable to provide CSW with this information, nor was patient's physical therapist.  CSW agreed to try and meet with [name], Social Curator at [place], to discuss patient's discharge disposition, as well as to inquire as to whether or not patient has a tentative discharge date scheduled at this time; however, [name] was also unavailable at the time of CSW's arrival.  In the HIPAA compliant message that [name] left for [name] on her desk, CSW requested that [name] follow-up with CSW, in addition to the patient.  CSW noted that patient's daughter, [name] was able to converse with [name], Internal Medicine Nurse Practitioner with [facility] at [place], to discuss goals of care for patient.  It is patient and [name]'s desire for patient to return home with [name] to live out the rest of her life.  CSW has notified [name], Geriatric Nurse Practitioner with [facility], of patient and [name]'s plans to have patient transitioned to hospice services, once patient is discharged back home from [facility], if patient continues to meet eligibility criteria.  CSW will continue to follow patient to ensure that she elects hospice services for end of life care.  CSW will also assist patient and [name] with arranging for any durable medical equipment that patient may need in the home.  CSW will meet with patient for the next  routine skilled nursing facility visit, on Tuesday, December 09, 2018 at 9:00AM, to assess and assist with discharge planning needs and services.  Patient continues to ambulate with a rolling walker and is continent of bowel and bladder.  Patient admits to needing assistance with certain activities of daily living, such as bathing, dressing, meal preparation, medication administration, etc.  Patient continues to complain of weakness and shortness of breath, especially with activity, but believes that she is getting stronger through worker with therapies.  Patient continues to appear motivated for treatment.  [name], BSW, MSW, LCSW  Licensed Education officer, environmental Health System  Mailing [place] [address], [place], [place] [address] Physical Address-[address], [place], [place] [address] Toll Free Main # [phone] Fax # [phone] Cell # [phone]  Office # [phone] [name]

## 2018-12-04 DIAGNOSIS — J449 Chronic obstructive pulmonary disease, unspecified: Secondary | ICD-10-CM | POA: Diagnosis not present

## 2018-12-04 DIAGNOSIS — I5033 Acute on chronic diastolic (congestive) heart failure: Secondary | ICD-10-CM | POA: Diagnosis not present

## 2018-12-04 DIAGNOSIS — J8410 Pulmonary fibrosis, unspecified: Secondary | ICD-10-CM | POA: Diagnosis not present

## 2018-12-04 DIAGNOSIS — I10 Essential (primary) hypertension: Secondary | ICD-10-CM | POA: Diagnosis not present

## 2018-12-07 ENCOUNTER — Emergency Department (HOSPITAL_COMMUNITY): Payer: HMO

## 2018-12-07 ENCOUNTER — Emergency Department (HOSPITAL_COMMUNITY)
Admission: EM | Admit: 2018-12-07 | Discharge: 2018-12-08 | Disposition: A | Discharge status: Home / Self Care | Payer: HMO | Attending: Emergency Medicine | Admitting: Emergency Medicine

## 2018-12-07 DIAGNOSIS — Z7982 Long term (current) use of aspirin: Secondary | ICD-10-CM | POA: Insufficient documentation

## 2018-12-07 DIAGNOSIS — R079 Chest pain, unspecified: Secondary | ICD-10-CM | POA: Diagnosis not present

## 2018-12-07 DIAGNOSIS — I110 Hypertensive heart disease with heart failure: Secondary | ICD-10-CM | POA: Diagnosis not present

## 2018-12-07 DIAGNOSIS — I209 Angina pectoris, unspecified: Secondary | ICD-10-CM | POA: Diagnosis not present

## 2018-12-07 DIAGNOSIS — Z79899 Other long term (current) drug therapy: Secondary | ICD-10-CM | POA: Insufficient documentation

## 2018-12-07 DIAGNOSIS — I2510 Atherosclerotic heart disease of native coronary artery without angina pectoris: Secondary | ICD-10-CM | POA: Insufficient documentation

## 2018-12-07 DIAGNOSIS — Z85828 Personal history of other malignant neoplasm of skin: Secondary | ICD-10-CM | POA: Diagnosis not present

## 2018-12-07 DIAGNOSIS — J449 Chronic obstructive pulmonary disease, unspecified: Secondary | ICD-10-CM | POA: Insufficient documentation

## 2018-12-07 DIAGNOSIS — R0602 Shortness of breath: Secondary | ICD-10-CM | POA: Diagnosis not present

## 2018-12-07 DIAGNOSIS — Z8673 Personal history of transient ischemic attack (TIA), and cerebral infarction without residual deficits: Secondary | ICD-10-CM | POA: Diagnosis not present

## 2018-12-07 DIAGNOSIS — I5042 Chronic combined systolic (congestive) and diastolic (congestive) heart failure: Secondary | ICD-10-CM | POA: Insufficient documentation

## 2018-12-07 LAB — COMPREHENSIVE METABOLIC PANEL
ALBUMIN: 3.4 g/dL — AB (ref 3.5–5.0)
ALT: 14 U/L (ref 0–44)
AST: 17 U/L (ref 15–41)
Alkaline Phosphatase: 56 U/L (ref 38–126)
Anion gap: 9 (ref 5–15)
BUN: 21 mg/dL (ref 8–23)
CO2: 31 mmol/L (ref 22–32)
Calcium: 8.3 mg/dL — ABNORMAL LOW (ref 8.9–10.3)
Chloride: 100 mmol/L (ref 98–111)
Creatinine, Ser: 1.1 mg/dL — ABNORMAL HIGH (ref 0.44–1.00)
GFR calc Af Amer: 52 mL/min — ABNORMAL LOW (ref >60)
GFR calc non Af Amer: 45 mL/min — ABNORMAL LOW (ref >60)
Glucose, Bld: 104 mg/dL — ABNORMAL HIGH (ref 70–99)
Potassium: 4.7 mmol/L (ref 3.5–5.1)
SODIUM: 140 mmol/L (ref 135–145)
Total Bilirubin: 1 mg/dL (ref 0.3–1.2)
Total Protein: 6.5 g/dL (ref 6.5–8.1)

## 2018-12-07 LAB — CBC WITH DIFFERENTIAL/PLATELET
Abs Immature Granulocytes: 0.1 10*3/uL — ABNORMAL HIGH (ref 0.00–0.07)
Basophils Absolute: 0 10*3/uL (ref 0.0–0.1)
Basophils Relative: 0 %
EOS ABS: 0.2 10*3/uL (ref 0.0–0.5)
EOS PCT: 2 %
HCT: 37.5 % (ref 36.0–46.0)
Hemoglobin: 11.7 g/dL — ABNORMAL LOW (ref 12.0–15.0)
Immature Granulocytes: 1 %
Lymphocytes Relative: 11 %
Lymphs Abs: 1.3 10*3/uL (ref 0.7–4.0)
MCH: 33.1 pg (ref 26.0–34.0)
MCHC: 31.2 g/dL (ref 30.0–36.0)
MCV: 105.9 fL — ABNORMAL HIGH (ref 80.0–100.0)
Monocytes Absolute: 1.5 10*3/uL — ABNORMAL HIGH (ref 0.1–1.0)
Monocytes Relative: 13 %
Neutro Abs: 8.4 10*3/uL — ABNORMAL HIGH (ref 1.7–7.7)
Neutrophils Relative %: 73 %
Platelets: 171 10*3/uL (ref 150–400)
RBC: 3.54 MIL/uL — ABNORMAL LOW (ref 3.87–5.11)
RDW: 16.8 % — ABNORMAL HIGH (ref 11.5–15.5)
WBC: 11.5 10*3/uL — AB (ref 4.0–10.5)
nRBC: 0 % (ref 0.0–0.2)

## 2018-12-07 LAB — TROPONIN I
Troponin I: 0.04 ng/mL (ref <0.03)
Troponin I: 0.04 ng/mL (ref <0.03)

## 2018-12-07 LAB — BRAIN NATRIURETIC PEPTIDE: B Natriuretic Peptide: 219.8 pg/mL — ABNORMAL HIGH (ref 0.0–100.0)

## 2018-12-07 NOTE — ED Notes (Signed)
Bed: WA02 Expected date:  Expected time:  Means of arrival:  Comments: EMS - 85yo SOB

## 2018-12-07 NOTE — ED Provider Notes (Signed)
[facility] DEPT Provider Note   CSN: [id number] Arrival date & time: 12/07/18  1737     History   Chief Complaint Chief Complaint  Patient presents with  . Shortness of Breath    HPI Linda Kelly is a 85 y.o. female.  HPI Patient is coming from rehab facility.  Has history of congestive heart failure, COPD, pulmonary fibrosis.  States she was having central chest pain that started this afternoon.  Was given nitroglycerin with complete resolution of her pain.  Patient's only complaint currently is mild drowsiness.  States she has had minimal cough.  She had some breath associated with the chest pain but is at her baseline currently.  No new lower extremity swelling or pain. Past Medical History:  Diagnosis Date  . Anxiety   . Anxiety and depression   . Arthritis   . Benign neoplasm of esophagus   . CAD (coronary artery disease)   . Cancer [facility])    Skin cancer on back  . Carotid artery disease ([place])   . CHF (congestive heart failure) ([place])   . Chronic gastritis   . Colon, diverticulosis   . COPD (chronic obstructive pulmonary disease) ([place])   . Depression   . GERD (gastroesophageal reflux disease)   . Hiatal hernia   . High frequency hearing loss   . Hyperlipidemia   . IBS (irritable bowel syndrome)   . Idiopathic pulmonary fibrosis   . Internal hemorrhoid   . MI (myocardial infarction) ([place]) 1985  . Other nonthrombocytopenic purpuras   . PONV (postoperative nausea and vomiting)   . PVD (peripheral vascular disease) ([place])   . Shortness of breath    with exertion  . Solitary pulmonary nodule   . Stroke [facility])    Hx: of several mini -strokes  . Tinnitus    chronic  . Vertigo     Patient Active Problem List   Diagnosis Date Noted  . Respiratory failure ([place]) 11/11/2018  . Sepsis ([place]) 11/10/2018  . Mild epistaxis 10/17/2018  . Acute renal failure (ARF) ([place]) 10/03/2018  . Acute combined systolic and diastolic congestive heart  failure ([place]) 10/03/2018  . Acute lower UTI 10/03/2018  . Nausea and vomiting 10/03/2018  . Elevated liver enzymes 10/03/2018  . Prolonged QT interval 10/03/2018  . Severe sepsis ([place]) 10/02/2018  . Excessive cerumen in ear canal, bilateral 09/23/2018  . Left-sided headache 09/11/2017  . Ischemic cardiomyopathy 03/14/2017  . Pain of left upper extremity 02/18/2017  . Steroid-induced hyperglycemia 01/08/2017  . Anxiety state 01/08/2017  . Increased oxygen demand   . Palliative care by specialist   . Encounter for hospice care discussion   . Palliative care encounter   . Goals of care, counseling/discussion   . Demand ischemia ([place])   . Elevated troponin   . Chronic respiratory failure with hypoxia ([place]) 11/29/2016  . Episodic lightheadedness 08/28/2016  . Sleep difficulties 08/28/2016  . Chronic combined systolic (congestive) and diastolic (congestive) heart failure ([place])   . Pulmonary fibrosis ([place])   . NSTEMI myocardial infarction ([place]) 08/08/2016  . Lumbar radiculopathy 08/01/2016  . Left-sided low back pain without sciatica 03/28/2016  . Cephalalgia 03/10/2016  . Overactive bladder 02/21/2016  . Poor balance 02/21/2016  . Chronic lower back pain 02/21/2016  . Carotid stenosis-s/p CEA 2015 03/03/2014  . PVD (peripheral vascular disease) ([place]) 02/04/2014  . Change in bowel habits 05/22/2011  . URINARY INCONTINENCE 12/22/2010  . ABDOMINAL BRUIT 08/10/2010  . FASTING HYPERGLYCEMIA 11/15/2009  .  Depression 08/01/2009  . GERD 08/01/2009  . IRRITABLE BOWEL SYNDROME 12/27/2008  . SOB (shortness of breath) 12/09/2008  . TINNITUS, CHRONIC 11/05/2008  . HEARING LOSS, HIGH FREQUENCY 11/05/2008  . HIATAL HERNIA 08/11/2008  . Hypotension 06/08/2008  . Lung nodule 11/20/2007  . OBESITY, MILD 11/19/2007  . Hyperlipidemia 10/09/2007  . Tobacco abuse, in remission 10/09/2007  . Essential hypertension 10/09/2007  . CAD S/P percutaneous coronary angioplasty 1985 10/09/2007  . COPD  mixed type ([place]) 10/09/2007  . Dizziness 03/31/2007  . Headache 03/31/2007    Past Surgical History:  Procedure Laterality Date  . ANGIOPLASTY  1997, 98, 99  . BREAST SURGERY     Hx; of biopsy  . CARDIAC CATHETERIZATION N/A 08/10/2016   Procedure: Left Heart Cath and Coronary Angiography;  Surgeon: Belva Crome, MD;  Location: [facility];  Service: Cardiovascular;  Laterality: N/A;  . CAROTID ENDARTERECTOMY     left side x 3, saphenous vein graft from left leg  . CATARACT EXTRACTION W/ INTRAOCULAR LENS  IMPLANT, BILATERAL    . COLON SURGERY    . COLONOSCOPY  11-27-01, 10-30-05, 05-24-11   diverticulosis, hemorrhoids  . DENTAL SURGERY     2012   . ENDARTERECTOMY Right 03/03/2014   Procedure: RIGHT CAROTID ENDARTERECTOMY WITH PATCH ANGIOPLASTY;  Surgeon: Rosetta Posner, MD;  Location: [facility];  Service: Vascular;  Laterality: Right;  . FOOT SURGERY     bilateral  . SHOULDER SURGERY     left  . TOE SURGERY     right foot  . TONSILLECTOMY    . TUBAL LIGATION    . UPPER GASTROINTESTINAL ENDOSCOPY  11-27-01, 08-18-08   Hiatal hernia, benign esophagus neoplasia, gastritis, tortuous esophagus 43 Maloney dilation performed     OB History   No obstetric history on file.      Home Medications    Prior to Admission medications   Medication Sig Start Date End Date Taking? Authorizing Provider  acetaminophen (TYLENOL) 650 MG CR tablet Take 650 mg by mouth daily.    [provider]  ALPRAZolam Duanne Moron) 0.25 MG tablet Take 1 tablet (0.25 mg total) by mouth 3 (three) times daily as needed for anxiety. 10/15/18   Binnie Rail, MD  aspirin EC 81 MG tablet Take 81 mg by mouth at bedtime.     [provider]  benzonatate (TESSALON) 100 MG capsule Take 1 capsule (100 mg total) by mouth 3 (three) times daily as needed for cough. 11/18/18   Donne Hazel, MD  cholecalciferol (VITAMIN D) 1000 UNITS tablet Take 1,000 Units by mouth at bedtime.     [provider]    ezetimibe (ZETIA) 10 MG tablet Take 1 tablet (10 mg total) by mouth daily. 08/07/18 11/27/18  Lelon Perla, MD  famotidine (PEPCID) 20 MG tablet Take 1 tablet (20 mg total) by mouth 2 (two) times daily. 09/18/18   Binnie Rail, MD  furosemide (LASIX) 40 MG tablet Take 1 tablet (40 mg total) by mouth daily for 30 days. 11/19/18 12/19/18  Donne Hazel, MD  HYDROcodone-acetaminophen (NORCO/VICODIN) 5-325 MG tablet Take 1-2 tablets by mouth every 4 (four) hours as needed for moderate pain. [place] reviewed. Rx needed for SNF placement 11/18/18   Donne Hazel, MD  ipratropium-albuterol (DUONEB) 0.5-2.5 (3) MG/3ML SOLN Take 3 mLs by nebulization every 4 (four) hours as needed. Patient taking differently: Take 3 mLs by nebulization every 4 (four) hours as needed (shortness of breath).  09/24/18   Baird Lyons D, MD  metoprolol succinate (TOPROL-XL) 25 MG 24 hr tablet Take 0.5 tablets (12.5 mg total) by mouth daily. 10/13/18   Lelon Perla, MD  montelukast (SINGULAIR) 10 MG tablet TAKE 1 TABLET BY MOUTH EVERYDAY AT BEDTIME 09/29/18   Burns, Claudina Lick, MD  Morphine Sulfate (MORPHINE CONCENTRATE) 10 MG/0.5ML SOLN concentrated solution Take 0.25 mLs (5 mg total) by mouth every 4 (four) hours as needed for shortness of breath. [place] reviewed. Rx needed for SNF placement 11/18/18   Donne Hazel, MD  nitroGLYCERIN (NITROSTAT) 0.4 MG SL tablet DISSOLVE 1 TABLET UNDER THE TONGUE EVERY 5 MINUTES FOR 3 DOSES AS NEEDED FOR CHEST PAIN. Patient taking differently: Place 0.4 mg under the tongue every 5 (five) minutes as needed for chest pain.  09/22/18   Binnie Rail, MD  Omega-3 Fatty Acids (FISH OIL) 1000 MG CAPS Take 1,000 mg by mouth at bedtime.     [provider]  potassium chloride (K-DUR) 10 MEQ tablet TAKE 1/2 TABLET ONCE DAILY Patient taking differently: Take 5 mEq by mouth daily.  09/22/18   Lelon Perla, MD  predniSONE (DELTASONE) 5 MG tablet Take 5 mg by mouth daily with  breakfast.    [provider]  Propyl Glycol-Hydroxyethylcell (NASAL MOIST) GEL Place 1 application into the nose daily as needed (nasal congestion).    [provider]  sertraline (ZOLOFT) 50 MG tablet TAKE 1 TABLET BY MOUTH EVERY DAY Patient taking differently: Take 50 mg by mouth daily.  08/06/18   Binnie Rail, MD  simvastatin (ZOCOR) 40 MG tablet TAKE 1 TABLET BY MOUTH EVERY DAY Patient taking differently: Take 40 mg by mouth daily at 6 PM.  07/08/18   Burns, Claudina Lick, MD  sodium chloride (OCEAN) 0.65 % SOLN nasal spray Place 1 spray into both nostrils as needed for congestion.    [provider]  vitamin E 400 UNIT capsule Take 400 Units by mouth at bedtime.     [provider]    Family History Family History  Problem Relation Age of Onset  . Heart attack Father        deceased at 32, MGF  . Colon cancer Father   . Prostate cancer Father   . Heart disease Father   . Hyperlipidemia Father   . Heart disease Mother        deaceased at age 53  . Colitis Mother   . Stroke Mother   . Hyperlipidemia Mother   . Colitis Sister   . Hyperlipidemia Sister   . Other Brother        deceased age 63; war  . Hyperlipidemia Brother   . Breast cancer Maternal Aunt        great   . Lung cancer Paternal Aunt        great  . Vasculitis Son   . Hyperlipidemia Son   . Hypertension Son   . Heart attack Son   . Cancer Sister        unknown  . Hyperlipidemia Sister   . Hyperlipidemia Daughter   . Hypertension Daughter   . COPD Neg Hx   . Asthma Neg Hx     Social History Social History   Tobacco Use  . Smoking status: Former Smoker    Packs/day: 0.50    Years: 60.00    Pack years: 30.00    Types: Cigarettes    Last attempt to quit: 02/26/2014  Years since quitting: 4.7  . Smokeless tobacco: Never Used  Substance Use Topics  . Alcohol use: No  . Drug use: No     Allergies   Patient has no known allergies.   Review of Systems Review of  Systems  Constitutional: Negative for chills and fever.  HENT: Negative for sore throat and trouble swallowing.   Eyes: Negative for visual disturbance.  Respiratory: Positive for cough and shortness of breath. Negative for wheezing.   Cardiovascular: Positive for chest pain. Negative for leg swelling.  Gastrointestinal: Negative for abdominal pain, diarrhea, nausea and vomiting.  Genitourinary: Negative for dysuria and flank pain.  Musculoskeletal: Negative for back pain and neck pain.  Skin: Negative for rash and wound.  Neurological: Negative for dizziness, weakness, light-headedness, numbness and headaches.  All other systems reviewed and are negative.    Physical Exam Updated Vital Signs BP 123/83   Pulse 79   Temp 97.7 F (36.5 C) (Oral)   Resp (!) 22   SpO2 98%   Physical Exam Vitals signs and nursing note reviewed.  Constitutional:      Appearance: Normal appearance. She is well-developed.  HENT:     Head: Normocephalic and atraumatic.     Nose: Nose normal. No congestion.     Mouth/Throat:     Mouth: Mucous membranes are moist.  Eyes:     Extraocular Movements: Extraocular movements intact.     Pupils: Pupils are equal, round, and reactive to light.  Neck:     Musculoskeletal: Normal range of motion and neck supple. No neck rigidity or muscular tenderness.  Cardiovascular:     Rate and Rhythm: Normal rate and regular rhythm.     Heart sounds: No murmur. No friction rub. No gallop.   Pulmonary:     Effort: Pulmonary effort is normal.     Breath sounds: Rales present.     Comments: Patient has bilateral crackles especially in the bases.  Very mild tachypnea.  Does not appear to be in respiratory distress.  Speaking in full paragraphs. Abdominal:     General: Bowel sounds are normal.     Palpations: Abdomen is soft.     Tenderness: There is no abdominal tenderness. There is no guarding or rebound.  Musculoskeletal: Normal range of motion.        General: No  swelling, tenderness, deformity or signs of injury.     Right lower leg: No edema.     Left lower leg: No edema.  Lymphadenopathy:     Cervical: No cervical adenopathy.  Skin:    General: Skin is warm and dry.     Capillary Refill: Capillary refill takes less than 2 seconds.     Findings: No erythema or rash.  Neurological:     General: No focal deficit present.     Mental Status: She is alert and oriented to person, place, and time.  Psychiatric:        Mood and Affect: Mood normal.        Behavior: Behavior normal.      ED Treatments / Results  Labs (all labs ordered are listed, but only abnormal results are displayed) Labs Reviewed  CBC WITH DIFFERENTIAL/PLATELET - Abnormal; Notable for the following components:      Result Value   WBC 11.5 (*)    RBC 3.54 (*)    Hemoglobin 11.7 (*)    MCV 105.9 (*)    RDW 16.8 (*)    Neutro Abs 8.4 (*)  Monocytes Absolute 1.5 (*)    Abs Immature Granulocytes 0.10 (*)    All other components within normal limits  BRAIN NATRIURETIC PEPTIDE - Abnormal; Notable for the following components:   B Natriuretic Peptide 219.8 (*)    All other components within normal limits  TROPONIN I - Abnormal; Notable for the following components:   Troponin I 0.04 (*)    All other components within normal limits  COMPREHENSIVE METABOLIC PANEL - Abnormal; Notable for the following components:   Glucose, Bld 104 (*)    Creatinine, Ser 1.10 (*)    Calcium 8.3 (*)    Albumin 3.4 (*)    GFR calc non Af Amer 45 (*)    GFR calc Af Amer 52 (*)    All other components within normal limits  TROPONIN I - Abnormal; Notable for the following components:   Troponin I 0.04 (*)    All other components within normal limits    EKG EKG Interpretation  Date/Time:  Sunday December 07 2018 19:19:24 EST Ventricular Rate:  85 PR Interval:    QRS Duration: 117 QT Interval:  437 QTC Calculation: 520 R Axis:   -4 Text Interpretation:  Sinus rhythm LVH with IVCD  and secondary repol abnrm Inferior infarct, age indeterminate Prolonged QT interval Confirmed by Julianne Rice [phone]) on 12/07/2018 10:32:50 PM   Radiology Dg Chest 2 View  Result Date: 12/07/2018 CLINICAL DATA:  Chest pain, oxygen dependence, shortness of breath all day, history COPD, coronary artery disease post MI, stroke EXAM: CHEST - 2 VIEW COMPARISON:  11/14/2018 FINDINGS: Upper normal heart size with mitral annular calcification. Atherosclerotic calcification aorta. Mediastinal contours and pulmonary vascularity normal. Diffuse chronic interstitial changes in the mid to lower lungs similar to baseline appearance from an earlier study of 10/02/2018 consistent with pulmonary fibrosis. No definite acute infiltrate, pleural effusion or pneumothorax. Bones demineralized. IMPRESSION: Chronic interstitial disease/fibrosis at lung bases. No definite acute infiltrate. Electronically Signed   By: Lavonia Dana M.D.   On: 12/07/2018 19:04    Procedures Procedures (including critical care time)  Medications Ordered in ED Medications - No data to display   Initial Impression / Assessment and Plan / ED Course  I have reviewed the triage vital signs and the nursing notes.  Pertinent labs & imaging results that were available during my care of the patient were reviewed by me and considered in my medical decision making (see chart for details).    Patient with chest pain relieved by nitroglycerin.  States she is currently at her baseline.  Has known multivessel coronary artery disease and not a candidate for intervention. Troponin x2 is 0.04.  EKG without concerning findings.  Will discharge back to nursing home.  Return precautions given.  Final Clinical Impressions(s) / ED Diagnoses   Final diagnoses:  Anginal pain [facility])    ED Discharge Orders    None       Julianne Rice, MD 12/07/18 2236

## 2018-12-07 NOTE — ED Notes (Addendum)
Daughter called and updated on patient's DC. [facility] called for transport. Attempted to call [facility] for report.

## 2018-12-07 NOTE — ED Triage Notes (Signed)
Transported by Linda Kelly from [facility]. Oxygen dependent and staff reports that she has been short of breath all day. 4-5 L /min. Denies cough or chest pain.

## 2018-12-07 NOTE — Discharge Instructions (Addendum)
Follow-up with your cardiologist.  Return for worsening pain or concerns.

## 2018-12-07 NOTE — ED Notes (Signed)
Date and time results received: 12/07/18 1951 (use smartphrase ".now" to insert current time)  Test: troponin Critical Value: 0.04  Name of Provider Notified: Dr Lita Mains  Orders Received? Or Actions Taken?: Actions Taken: reported troponin 0.04 to Dr Lita Mains.

## 2018-12-08 DIAGNOSIS — J8410 Pulmonary fibrosis, unspecified: Secondary | ICD-10-CM | POA: Diagnosis not present

## 2018-12-08 DIAGNOSIS — I2510 Atherosclerotic heart disease of native coronary artery without angina pectoris: Secondary | ICD-10-CM | POA: Diagnosis not present

## 2018-12-08 DIAGNOSIS — R0789 Other chest pain: Secondary | ICD-10-CM | POA: Diagnosis not present

## 2018-12-08 DIAGNOSIS — I5033 Acute on chronic diastolic (congestive) heart failure: Secondary | ICD-10-CM | POA: Diagnosis not present

## 2018-12-09 ENCOUNTER — Other Ambulatory Visit: Payer: Self-pay

## 2018-12-09 ENCOUNTER — Other Ambulatory Visit: Payer: Self-pay | Admitting: *Deleted

## 2018-12-09 NOTE — Patient Outreach (Signed)
  [facility]) Care Management Chronic Special Needs Program   12/09/2018  Name: Linda Kelly, DOB: Jan 01, 1928  MRN: [id number]  The client was discussed in today's interdisciplinary care team meeting.  The following issues were discussed: 85 year old currently residing at [facility] skilled facility for rehabilitation. Per chart history of HTN,myocardial infarction, congestive heart failure, COPD, pulmonary fibrosis.  Client's needs, Key risk triggers/risk stratification, Coordination of care, Care transitions and Issues/barriers to care  Participants present:  Thea Silversmith, RNCM; [name], LCSW  Per Mrs. Saporito, client plans to discharge to home with her daughter, Linda Kelly, and they are discussing discharge to home with hospice. She is awaiting return call from client's daughter to discuss. Per chart, goals of care discussion completed with client's daugher by [name], NP with [facility] Palliative care of [place]. Per Mrs. Saporito, client does not have a discharge date at this time.  Recommendations:  [facility] Social to continue to follow and care coordinate with client, client's daughter and staff at skilled facility to assist with discharge as needed.   Plan: RNCM will continue to follow/ assist with care coordination needs.  Thea Silversmith, RN, MSN, [place] [phone]

## 2018-12-09 NOTE — Patient Outreach (Signed)
[facility]) Care Management  12/09/2018  Linda Kelly April 17, 1928 [id number]   CSW was able to meet with patient today at [facility] [place] where patient currently resides to receive short-term rehabilitative services.  CSW spoke with patient about her recent visit to the Emergency Department at [facility] on Sunday, December 07, 2018.  Patient admitted that she was feeling very short-of-breath, having pain in her chest, experiencing a new cough and somewhat drowsy.  Patient indicated that she always gets concerned when she experiences these types of symptoms, due to her history of Congestive Heart Failure, Chronic Obstructive Pulmonary Disease and Pulmonary Fibrosis.  Fortunately, patient was medically cleared and returned to [facility].  Patient was unable to provide CSW with any of her discharge planning arrangements, with the exception of informing CSW that she will definitely be returning home to live with her daughter, Linda Kelly, at time of discharge from the skilled nursing facility.  CSW made an attempt to try and contact Linda Kelly to confirm patient's discharge plans; however, Linda Kelly was not available at the time of CSW's call.  CSW left a HIPAA compliant message for Linda Kelly and is currently awaiting a return call.  CSW also tried to meet with Linda Kelly, Social Curator at [place]; however, she was unavailable at the time of CSW's visit.  CSW will make contact with patient again on Wednesday, December 17, 2018 at 10:00AM at [facility] to assess and assist with discharge planning needs and services.  Linda Kelly, BSW, MSW, LCSW  Licensed Education officer, environmental Health System  Mailing [place] [address] [place], [place] [address] Physical Address-[address], [place], [place] [address] Toll Free Main #  [phone] Fax # [phone] Cell # [phone]  Office # [phone] [name]

## 2018-12-10 ENCOUNTER — Other Ambulatory Visit: Payer: Self-pay

## 2018-12-10 ENCOUNTER — Encounter: Payer: Self-pay | Admitting: *Deleted

## 2018-12-10 ENCOUNTER — Other Ambulatory Visit: Payer: Self-pay | Admitting: *Deleted

## 2018-12-16 NOTE — Telephone Encounter (Signed)
Found obit.  Sent that with demographics to Med records to be marked.

## 2018-12-17 ENCOUNTER — Ambulatory Visit: Payer: HMO | Admitting: *Deleted

## 2018-12-17 DIAGNOSIS — R0602 Shortness of breath: Secondary | ICD-10-CM | POA: Diagnosis not present

## 2018-12-17 DIAGNOSIS — I509 Heart failure, unspecified: Secondary | ICD-10-CM | POA: Diagnosis not present

## 2018-12-17 DIAGNOSIS — J9621 Acute and chronic respiratory failure with hypoxia: Secondary | ICD-10-CM | POA: Diagnosis not present

## 2018-12-17 DIAGNOSIS — J449 Chronic obstructive pulmonary disease, unspecified: Secondary | ICD-10-CM | POA: Diagnosis not present

## 2018-12-17 DIAGNOSIS — J8410 Pulmonary fibrosis, unspecified: Secondary | ICD-10-CM | POA: Diagnosis not present

## 2018-12-28 NOTE — Patient Outreach (Signed)
  [facility]) Care Management Chronic Special Needs Program    December 11, 2018  Name: Linda Kelly, DOB: 20-Sep-1928  MRN: [id number]   Linda Kelly is enrolled in a chronic special needs plan. RNCM received notification from utilization management that client is deceased.  Plan: close case. Send case closure letter to primary care.  Thea Silversmith, RN, MSN, [place] [phone]

## 2018-12-28 NOTE — Telephone Encounter (Signed)
Dr. Quay Burow wants this to get to medical records. Who would I forward this too?

## 2018-12-28 NOTE — Patient Outreach (Signed)
[facility]) Care Management  Jan 05, 2019  Linda Kelly [birth year] [id number]   CSW received an In [employer] from Thea Silversmith, [place] Management Coordinator, also with [facility], informing CSW that patient died at [facility] where patient was residing to receive short-term rehabilitative services.  CSW will perform a case closure on patient, as well as notify patient's Primary Care Physician, Dr. Billey Gosling. Linda Kelly, BSW, MSW, LCSW  Licensed Education officer, [facility]  Mailing [place] [address], [place], [place] [address] Physical Address-[address], [place], [place] [address] Toll Free Main # [phone] Fax # [phone] Cell # [phone]  Office # [phone] [name]

## 2018-12-28 NOTE — Telephone Encounter (Signed)
Rec'd msg on Patient Ping. On 12-29-2018 5:30 AM pt was found Deceased at [facility]. Will forward to MD for FYI.Marland KitchenJohny Kelly

## 2018-12-28 NOTE — Patient Outreach (Signed)
  [facility]) Care Management Chronic Special Needs Program   26-Dec-2018  Name: Linda Kelly, DOB: 03-Nov-1927  MRN: [id number]  The client was discussed in today's interdisciplinary care team meeting.  The following issues were discussed:  Client's needs, Key risk triggers/risk stratification, Care Plan, Coordination of care, Care transitions and Issues/barriers to care  Participants present:  Mahlon Gammon, RNCM; Peter Garter, RNCM; Thea Silversmith, RNCM; Dr. Marco Collie.  Recommendations: Follow up post transition to from skilled facility to home, encourage follow up and engagement with from primary care physician.  Plan:  [facility] will continue to follow client to assist with discharge planning while in skilled facility. RNCM will continue care coordination and follow up post discharge to home.  Thea Silversmith, RN, MSN, [place] [phone]

## 2018-12-28 DEATH — deceased

## 2019-01-22 ENCOUNTER — Ambulatory Visit: Payer: Self-pay | Admitting: Internal Medicine

## 2019-02-09 ENCOUNTER — Ambulatory Visit: Payer: PPO | Admitting: Cardiology

## 2019-03-11 ENCOUNTER — Other Ambulatory Visit: Payer: Self-pay | Admitting: *Deleted

## 2020-11-30 IMAGING — CT CT ABD-PELV W/O CM
2 of 7 series · 14 of 46 positions shown, 18 images · non-contrast
Comparison: None.

CLINICAL DATA: Abdominal pain, fever, sepsis

EXAM:
CT ABDOMEN AND PELVIS WITHOUT CONTRAST
TECHNIQUE: Multidetector CT imaging of the abdomen and pelvis was performed
following the standard protocol without IV contrast.

[Series 3: abd/ pelvis 5.0 i30f 2 · axial · 0.82mm/px · z∈[-296,+64]mm · 11 of 86 slices shown, 15 images]
[im 9/86  soft-tissue]
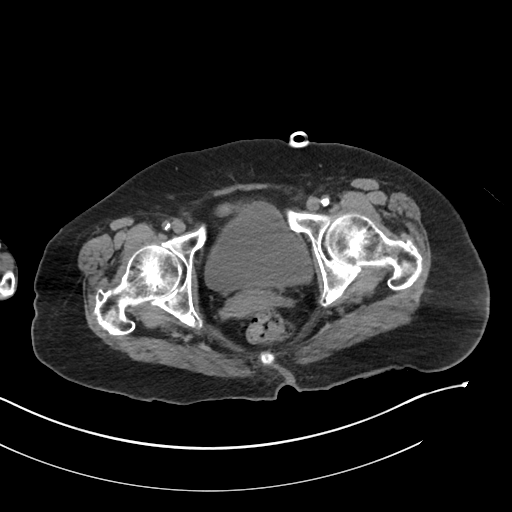
[im 9/86  bone]
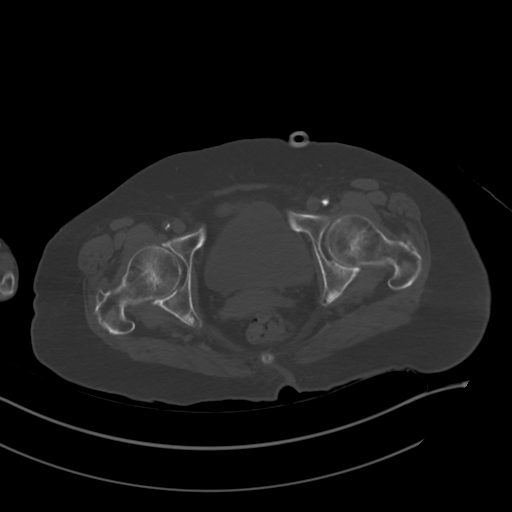
[im 17/86  soft-tissue]
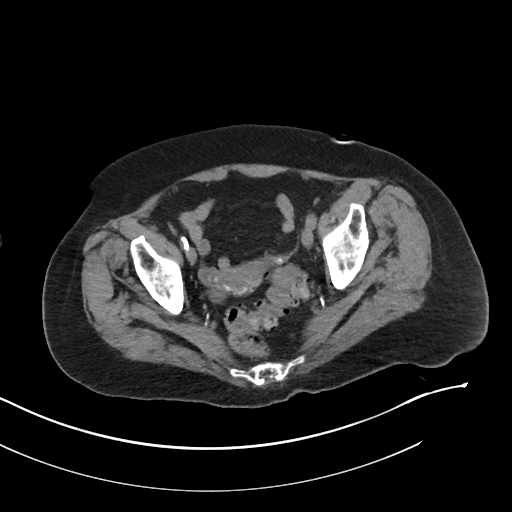
[im 25/86  soft-tissue]
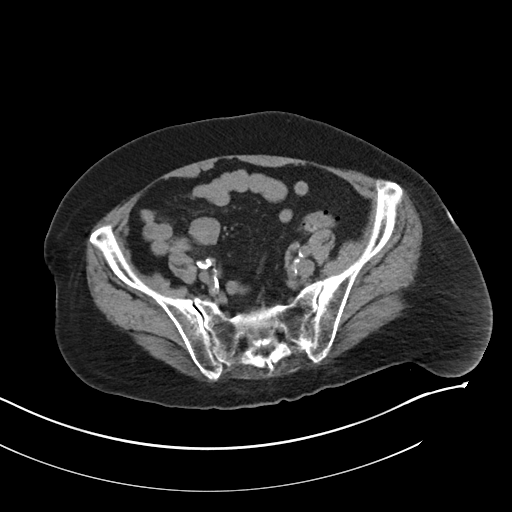
[im 33/86  soft-tissue]
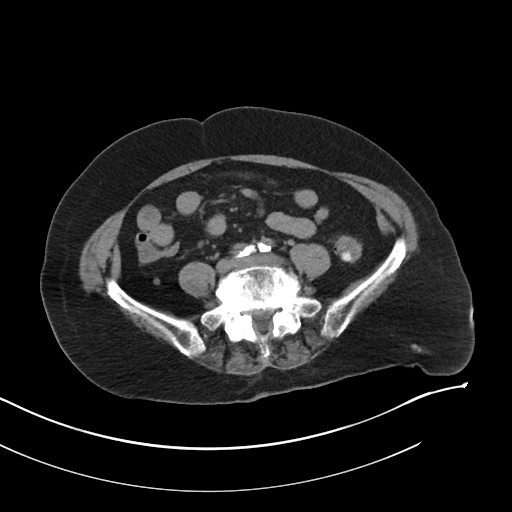
[im 45/86  soft-tissue]
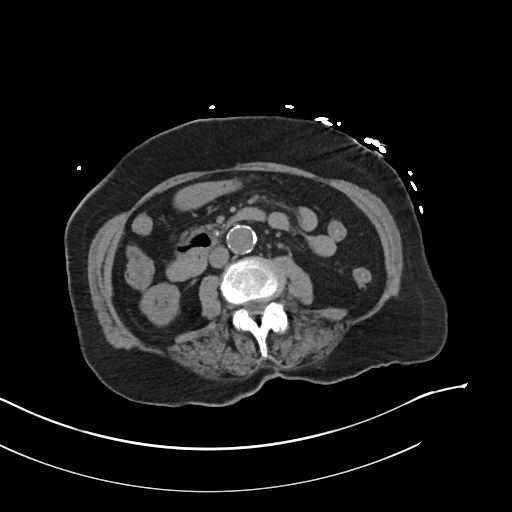
[im 53/86  soft-tissue]
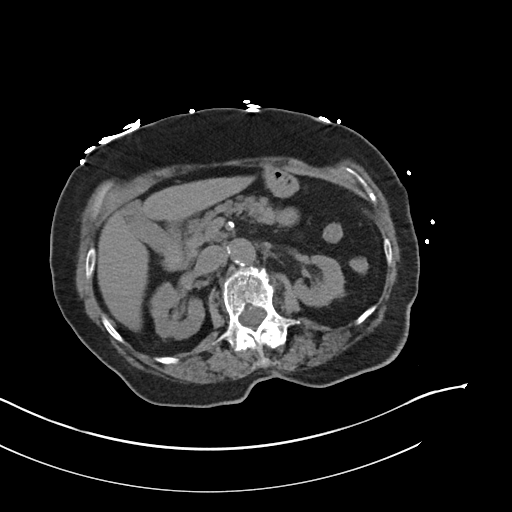
[im 61/86  soft-tissue]
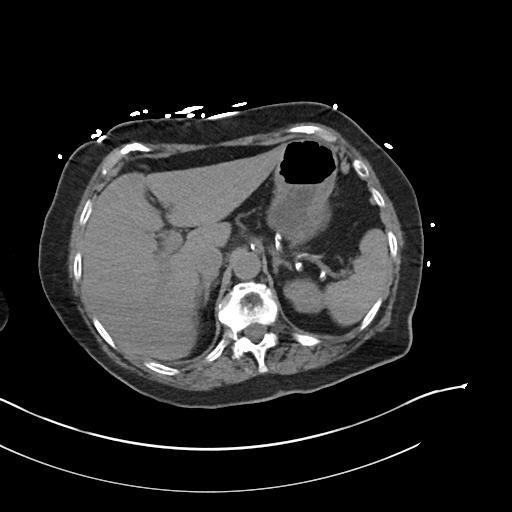
[im 69/86  soft-tissue]
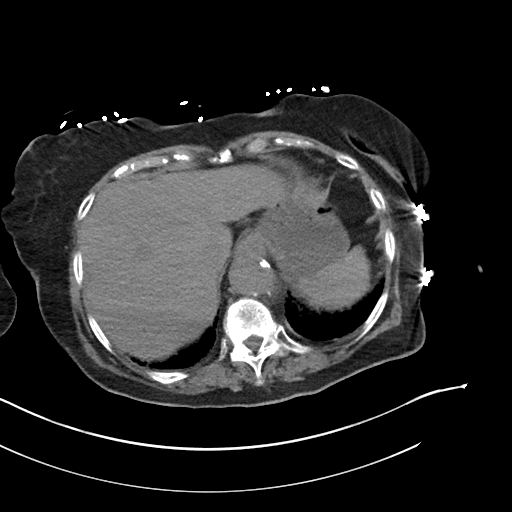
[im 69/86  lung]
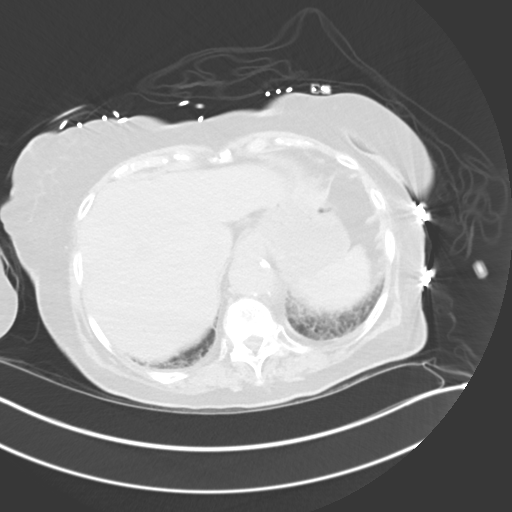
[im 73/86  lung]
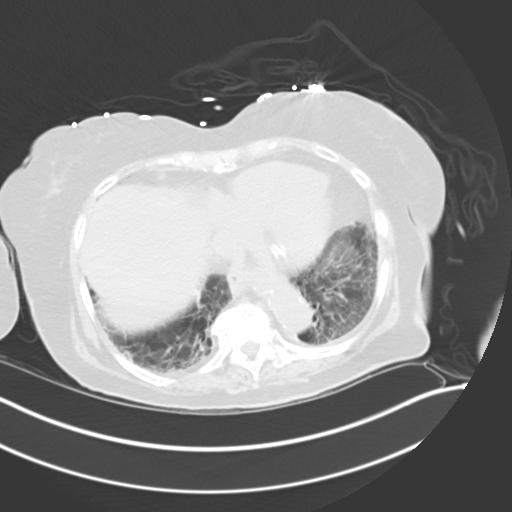
[im 77/86  soft-tissue]
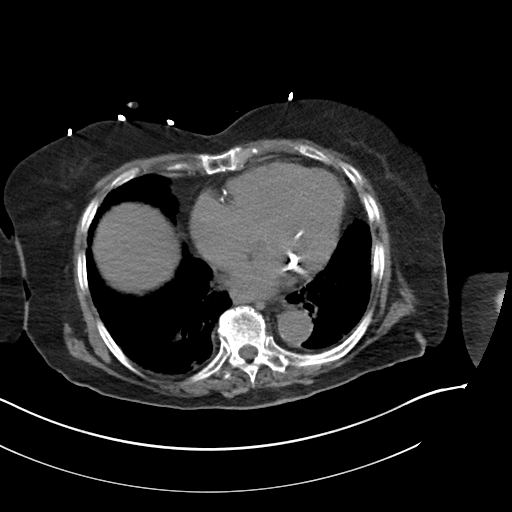
[im 77/86  lung]
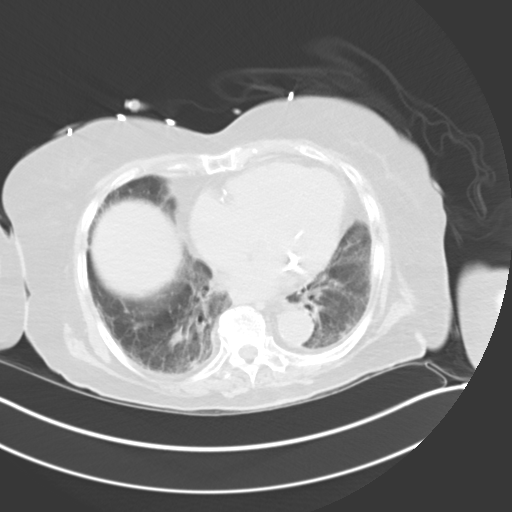
[im 77/86  bone]
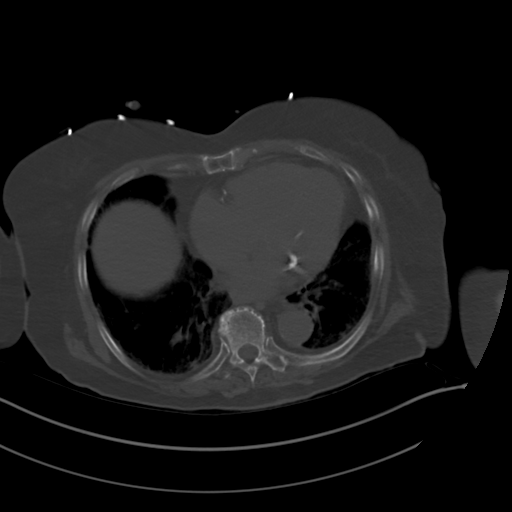
[im 81/86  lung]
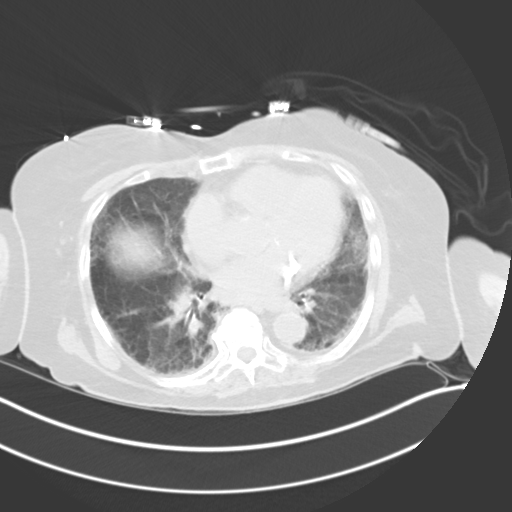

[Series 6: cor st · coronal · 0.71mm/px · 3 of 100 slices shown]
[im 25/100  soft-tissue]
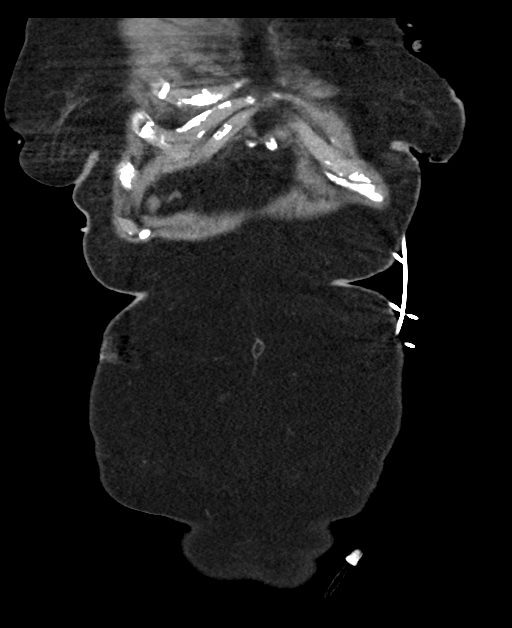
[im 50/100  soft-tissue]
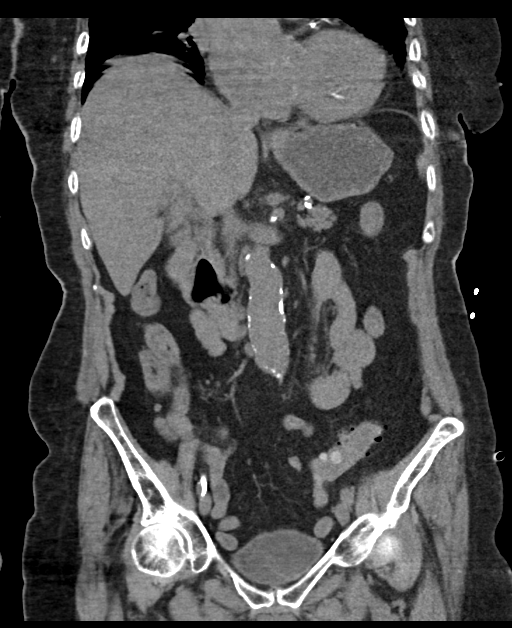
[im 75/100  soft-tissue]
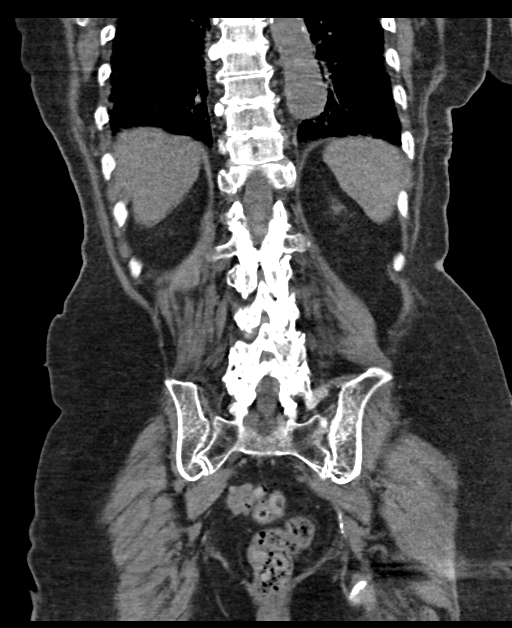

[14 of 46 positions shown; findings below may reference images not displayed]

FINDINGS: Lower chest: Mild cardiomegaly. Dense coronary artery
calcifications. Visualized aortic root mildly aneurysmal at 4.1 cm.
This is stable since prior chest CT from 11/28/2016. Scarring in the
lung bases.

Hepatobiliary: No focal hepatic abnormality. Gallbladder
unremarkable.

Pancreas: No focal abnormality or ductal dilatation.

Spleen: No focal abnormality.  Normal size.

Adrenals/Urinary Tract: No adrenal abnormality. No focal renal
abnormality. No stones or hydronephrosis. Urinary bladder is
unremarkable.

Stomach/Bowel: Left colonic diverticulosis. No active
diverticulitis.

Vascular/Lymphatic: Aortic atherosclerosis. No enlarged abdominal or
pelvic lymph nodes.

Reproductive: Uterus and adnexa unremarkable.  No mass.

Other: No free fluid or free air.

Musculoskeletal: No acute bony abnormality. Degenerative changes in
the lumbar spine.
IMPRESSION: No acute findings in the abdomen or pelvis.

Left colonic diverticulosis.  No active diverticulitis.

Aortic atherosclerosis.

Mild aneurysmal dilatation of the aortic root, stable since October 2016. Recommend annual imaging followup by CTA or MRA. This
recommendation follows 1535
ACCF/AHA/AATS/ACR/ASA/SCA/ZAPHANIAH/ALBIS/BOK KYU/WILLHEM Guidelines for the
Diagnosis and Management of Patients with Thoracic Aortic Disease.
Circulation. 1535; 121: [id number]

Coronary artery disease.
